# Patient Record
Sex: Female | Born: 1950
Health system: Southern US, Community
[De-identification: ages and names within clinical notes are randomized; demographics above are authoritative.]

## PROBLEM LIST (undated history)

## (undated) DIAGNOSIS — D219 Benign neoplasm of connective and other soft tissue, unspecified: Secondary | ICD-10-CM

## (undated) DIAGNOSIS — M8580 Other specified disorders of bone density and structure, unspecified site: Secondary | ICD-10-CM

## (undated) DIAGNOSIS — G20 Parkinson's disease: Secondary | ICD-10-CM

## (undated) DIAGNOSIS — K219 Gastro-esophageal reflux disease without esophagitis: Secondary | ICD-10-CM

## (undated) DIAGNOSIS — M6281 Muscle weakness (generalized): Secondary | ICD-10-CM

## (undated) HISTORY — PX: FINGER SURGERY: SHX640

## (undated) HISTORY — PX: MOUTH SURGERY: SHX715

## (undated) HISTORY — DX: Benign neoplasm of connective and other soft tissue, unspecified: D21.9

## (undated) HISTORY — PX: TUBAL LIGATION: SHX77

## (undated) HISTORY — PX: TONSILLECTOMY: SUR1361

## (undated) HISTORY — DX: Parkinson's disease: G20

## (undated) HISTORY — PX: TOE SURGERY: SHX1073

## (undated) HISTORY — DX: Muscle weakness (generalized): M62.81

## (undated) HISTORY — DX: Other specified disorders of bone density and structure, unspecified site: M85.80

## (undated) HISTORY — DX: Gastro-esophageal reflux disease without esophagitis: K21.9

---

## 2000-04-02 ENCOUNTER — Other Ambulatory Visit: Admission: RE | Admit: 2000-04-02 | Discharge: 2000-04-02 | Payer: Self-pay | Admitting: Obstetrics and Gynecology

## 2001-07-22 ENCOUNTER — Encounter: Payer: Self-pay | Admitting: *Deleted

## 2001-07-22 ENCOUNTER — Encounter: Admission: RE | Admit: 2001-07-22 | Discharge: 2001-07-22 | Payer: Self-pay | Admitting: *Deleted

## 2001-09-04 ENCOUNTER — Ambulatory Visit (HOSPITAL_BASED_OUTPATIENT_CLINIC_OR_DEPARTMENT_OTHER): Admission: RE | Admit: 2001-09-04 | Discharge: 2001-09-04 | Payer: Self-pay | Admitting: *Deleted

## 2002-03-29 ENCOUNTER — Encounter: Payer: Self-pay | Admitting: *Deleted

## 2002-03-29 ENCOUNTER — Encounter: Admission: RE | Admit: 2002-03-29 | Discharge: 2002-03-29 | Payer: Self-pay | Admitting: *Deleted

## 2002-04-08 ENCOUNTER — Other Ambulatory Visit: Admission: RE | Admit: 2002-04-08 | Discharge: 2002-04-08 | Payer: Self-pay | Admitting: *Deleted

## 2002-11-12 ENCOUNTER — Emergency Department (HOSPITAL_COMMUNITY): Admission: EM | Admit: 2002-11-12 | Discharge: 2002-11-12 | Payer: Self-pay | Admitting: Emergency Medicine

## 2003-01-03 ENCOUNTER — Encounter: Payer: Self-pay | Admitting: Emergency Medicine

## 2003-01-03 ENCOUNTER — Emergency Department (HOSPITAL_COMMUNITY): Admission: EM | Admit: 2003-01-03 | Discharge: 2003-01-04 | Payer: Self-pay | Admitting: Emergency Medicine

## 2003-01-04 ENCOUNTER — Encounter: Payer: Self-pay | Admitting: Emergency Medicine

## 2003-01-04 ENCOUNTER — Ambulatory Visit (HOSPITAL_COMMUNITY): Admission: RE | Admit: 2003-01-04 | Discharge: 2003-01-04 | Payer: Self-pay | Admitting: Emergency Medicine

## 2003-02-10 ENCOUNTER — Encounter: Payer: Self-pay | Admitting: Gynecology

## 2003-02-10 ENCOUNTER — Encounter: Admission: RE | Admit: 2003-02-10 | Discharge: 2003-02-10 | Payer: Self-pay | Admitting: Gynecology

## 2003-02-16 ENCOUNTER — Other Ambulatory Visit: Admission: RE | Admit: 2003-02-16 | Discharge: 2003-02-16 | Payer: Self-pay | Admitting: Gynecology

## 2005-01-14 ENCOUNTER — Emergency Department (HOSPITAL_COMMUNITY): Admission: EM | Admit: 2005-01-14 | Discharge: 2005-01-15 | Payer: Self-pay | Admitting: *Deleted

## 2005-01-20 ENCOUNTER — Encounter: Admission: RE | Admit: 2005-01-20 | Discharge: 2005-01-20 | Payer: Self-pay | Admitting: Obstetrics and Gynecology

## 2005-06-25 ENCOUNTER — Ambulatory Visit: Payer: Self-pay | Admitting: Internal Medicine

## 2005-06-28 ENCOUNTER — Ambulatory Visit (HOSPITAL_COMMUNITY): Admission: RE | Admit: 2005-06-28 | Discharge: 2005-06-28 | Payer: Self-pay | Admitting: Internal Medicine

## 2005-07-19 ENCOUNTER — Ambulatory Visit: Payer: Self-pay | Admitting: Internal Medicine

## 2005-08-12 ENCOUNTER — Ambulatory Visit: Payer: Self-pay | Admitting: Internal Medicine

## 2005-08-27 ENCOUNTER — Ambulatory Visit: Payer: Self-pay

## 2005-09-27 ENCOUNTER — Ambulatory Visit: Payer: Self-pay

## 2005-11-27 ENCOUNTER — Ambulatory Visit: Payer: Self-pay | Admitting: Internal Medicine

## 2006-01-09 ENCOUNTER — Ambulatory Visit: Payer: Self-pay | Admitting: Internal Medicine

## 2006-10-01 ENCOUNTER — Encounter: Admission: RE | Admit: 2006-10-01 | Discharge: 2006-10-01 | Payer: Self-pay | Admitting: Obstetrics and Gynecology

## 2007-11-26 ENCOUNTER — Ambulatory Visit (HOSPITAL_COMMUNITY): Admission: RE | Admit: 2007-11-26 | Discharge: 2007-11-26 | Payer: Self-pay | Admitting: Gastroenterology

## 2007-12-29 ENCOUNTER — Encounter: Admission: RE | Admit: 2007-12-29 | Discharge: 2007-12-29 | Payer: Self-pay | Admitting: Gastroenterology

## 2011-03-15 NOTE — Op Note (Signed)
[place]. [facility]  Patient:    [name] [name], [name] [name] Visit Number: [id number] MRN: [id number]          Service Type: DSU Location: [facility] Attending Physician:  Kendell Bane Dictated by:   Lowell Bouton, M.D. Proc. Date: 09/04/01 Admit Date:  09/04/2001 Discharge Date: 09/04/2001   CC:         Lester Kinsman, M.D.   Operative Report  PREOPERATIVE DIAGNOSIS:  Mass, right index finger.  POSTOPERATIVE DIAGNOSIS:  Ganglion, right index finger.  OPERATION PERFORMED:  Excision of ganglion, right index finger.  SURGEON:  Lowell Bouton, M.D.  ANESTHESIA:  0.5% Marcaine local with sedation.  OPERATIVE FINDINGS:  The patient had a large cyst that was located on the ulnar volar aspect of the proximal phalanx of the right index finger.  It was attached to the tendon sheath.  DESCRIPTION OF PROCEDURE:  Under 0.5% Marcaine local anesthesia with a tourniquet on the right arm, the right hand was prepped and draped in the usual fashion and after exsanguinating the limb the tourniquet was inflated to 250 mmHg.  A V-shaped incision was made over the volar aspect of the proximal phalanx of the right index finger.  Blunt dissection was carried through the subcutaneous tissues and bleeding points were coagulated.  Blunt dissection was carried down to the ulnar digital nerve which was retracted ulnarly.  The mass was easily identified in the soft tissue.  The mass was excised sharply from the flexor sheath.  The wound was then irrigated with saline, the skin was closed with 4-0 nylon suture.  Sterile dressings were applied.  The patient tolerated the procedure well and went to the recovery room awake and stable in good condition. Dictated by:   Lowell Bouton, M.D. Attending Physician:  Kendell Bane DD:  09/04/01 TD:  09/07/01 Job: [id number]

## 2012-10-02 ENCOUNTER — Other Ambulatory Visit (HOSPITAL_COMMUNITY)
Admission: RE | Admit: 2012-10-02 | Discharge: 2012-10-02 | Disposition: A | Discharge status: Home / Self Care | Payer: 59 | Source: Ambulatory Visit | Attending: Women's Health | Admitting: Women's Health

## 2012-10-02 ENCOUNTER — Ambulatory Visit (INDEPENDENT_AMBULATORY_CARE_PROVIDER_SITE_OTHER): Payer: 59 | Admitting: Women's Health

## 2012-10-02 ENCOUNTER — Encounter: Payer: Self-pay | Admitting: Women's Health

## 2012-10-02 VITALS — BP 128/86 | Ht 67.0 in | Wt 167.0 lb

## 2012-10-02 DIAGNOSIS — Z01419 Encounter for gynecological examination (general) (routine) without abnormal findings: Secondary | ICD-10-CM

## 2012-10-02 DIAGNOSIS — Z1151 Encounter for screening for human papillomavirus (HPV): Secondary | ICD-10-CM | POA: Insufficient documentation

## 2012-10-02 DIAGNOSIS — Z780 Asymptomatic menopausal state: Secondary | ICD-10-CM

## 2012-10-02 DIAGNOSIS — D126 Benign neoplasm of colon, unspecified: Secondary | ICD-10-CM

## 2012-10-02 DIAGNOSIS — K635 Polyp of colon: Secondary | ICD-10-CM | POA: Insufficient documentation

## 2012-10-02 NOTE — Progress Notes (Signed)
MERLIN EGE January 03, 1951 [id number]    History:    New patient presents for annual exam. Reports good health, history of normal Paps and mammograms. Mother with history of breast cancer, living age 62. Postmenopausal greater than 10 years with no bleeding/no HRT, occasional hot flush. Reports bone density normal 7-8 years ago. Benign polyp with colonoscopy 2010. Has not received zostavac.  Past medical history, past surgical history, family history and social history were all reviewed and documented in the EPIC chart. Physiological scientist. Widowed, 1 son died of cardiac defect, same cause of death as husband. Had a normal cardiac workup. History of shingles left arm.   ROS:  A  ROS was performed and pertinent positives and negatives are included in the history.  Exam:  Filed Vitals:   10/02/12 1116  BP: 128/86    General appearance:  Normal Head/Neck:  Normal, without cervical or supraclavicular adenopathy. Thyroid:  Symmetrical, normal in size, without palpable masses or nodularity. Respiratory  Effort:  Normal  Auscultation:  Clear without wheezing or rhonchi Cardiovascular  Auscultation:  Regular rate, without rubs, murmurs or gallops  Edema/varicosities:  Not grossly evident Abdominal  Soft,nontender, without masses, guarding or rebound.  Liver/spleen:  No organomegaly noted  Hernia:  None appreciated  Skin  Inspection:  Grossly normal  Palpation:  Grossly normal Neurologic/psychiatric  Orientation:  Normal with appropriate conversation.  Mood/affect:  Normal  Genitourinary    Breasts: Examined lying and sitting.     Right: Without masses, retractions, discharge or axillary adenopathy.     Left: Without masses, retractions, discharge or axillary adenopathy.   Inguinal/mons:  Normal without inguinal adenopathy  External genitalia:  Normal  BUS/Urethra/Skene's glands:  Normal  Bladder:  Normal  Vagina:  Plus 1 cystocele   Cervix:  Normal  Uterus:  Anteverted, 10  weeks' size lobular,   Midline and mobile  Adnexa/parametria:     Rt: Without masses or tenderness.   Lt: Without masses or tenderness.  Anus and perineum: Normal  Digital rectal exam: Normal sphincter tone without palpated masses or tenderness  Assessment/Plan:  61 y.o. WBF G5P4 for annual exam without complaint.  Asymptomatic +1 cystocele Fibroid uterus  Plan: DEXA will schedule here. Has appointment with primary care next week for labs. SBE's, continue annual mammogram, calcium rich diet, vitamin D 2000 daily encouraged. Reports history of fibroids after exam, states had heavy cycles before menopause, currently asymptomatic. UA, Pap. Reviewed new screening guidelines. Home Hemoccult card given with instructions. Instructed to get zostavac vaccine at primary care.    Harrington Challenger [facility], 12:56 PM 10/02/2012

## 2012-10-02 NOTE — Patient Instructions (Addendum)
Vit D 2000 daily Fish oil daily Shingles vaccine/zostavac Health Recommendations for Postmenopausal Women Based on the Results of the Women's Health Initiative [facility]) and Other Studies The WHI is a major 15-year research program to address the most common causes of death, disability and poor quality of life in postmenopausal women. Some of these causes are heart disease, cancer, bone loss (osteoporosis) and others. Taking into account all of the findings from [facility] and other studies, here are bottom-line health recommendations for women: CARDIOVASCULAR DISEASE Heart Disease: A heart attack is a medical emergency. Know the signs and symptoms of a heart attack. Hormone therapy should not be used to prevent heart disease. In women with heart disease, hormone therapy should not be used to prevent further disease. Hormone therapy increases the risk of blood clots. Below are things women can do to reduce their risk for heart disease.   Do not smoke. If you smoke, quit. Women who smoke are 2 to 6 times more likely to suffer a heart attack than non-smoking women.  Aim for a healthy weight. Being overweight causes many preventable deaths. Eat a healthy and balanced diet and drink an adequate amount of liquids.  Get moving. Make a commitment to be more physically active. Aim for 30 minutes of activity on most, if not all days of the week.  Eat for heart health. Choose a diet that is low in saturated fat, trans fat, and cholesterol. Include whole grains, vegetables, and fruits. Read the labels on the food container before buying it.  Know your numbers. Ask your caregiver to check your blood pressure, cholesterol (total, HDL, LDL, triglycerides) and blood glucose. Work with your caregiver to improve any numbers that are not normal.  High blood pressure. Limit or stop your table salt intake (try salt substitute and food seasonings), avoid salty foods and drinks. Read the labels on the food container before buying  it. Avoid becoming overweight by eating well and exercising. STROKE  Stroke is a medical emergency. Stroke can be the result of a blood clot in the blood vessel in the brain or by a brain hemorrhage (bleeding). Know the signs and symptoms of a stroke. To lower the risk of developing a stroke:  Avoid fatty foods.  Quit smoking.  Control your diabetes, blood pressure, and irregular heart rate. THROMBOPHLIBITIS (BLOOD CLOT) OF THE LEG  Hormone treatment is a big cause of developing blood clots in the leg. Becoming overweight and leading a stationary lifestyle also may contribute to developing blood clots. Controlling your diet and exercising will help lower the risk of developing blood clots. CANCER SCREENING  Breast Cancer: Women should take steps to reduce their risk of breast cancer. This includes having regular mammograms, monthly self breast exams and regular breast exams by your caregiver. Have a mammogram every one to two years if you are 61 to 62 years old. Have a mammogram annually if you are 28 years old or older depending on your risk factors. Women who are high risk for breast cancer may need more frequent mammograms. There are tests available (testing the genes in your body) if you have family history of breast cancer called BRCA 1 and 2. These tests can help determine the risks of developing breast cancer.  Intestinal or Stomach Cancer: Women should talk to their caregiver about when to start screening, what tests and how often they should be done, and the benefits and risks of doing these tests. Tests to consider are a rectal exam, fecal occult  blood, sigmoidoscopy, colononoscoby, barium enema and upper GI series of the stomach. Depending on the age, you may want to get a medical and family history of colon cancer. Women who are high risk may need to be screened at an earlier age and more often.  Cervical Cancer: A Pap test of the cervix should be done every year and every 3 years when  there has been three straight years of a normal Pap test. Women with an abnormal Pap test should be screened more often or have a cervical biopsy depending on your caregiver's recommendation.  Uterine Cancer: If you have vaginal bleeding after you are in the menopause, it should be evaluated by your caregiver.  Ovarian cancer: There are no reliable tests available to screen for ovarian cancer at this time except for yearly pelvic exams.  Lung Cancer: Yearly chest X-rays can detect lung cancer and should be done on high risk women, such as cigarette smokers and women with chronic lung disease (emphysemia).  Skin Cancer: A complete body skin exam should be done at your yearly examination. Avoid overexposure to the sun and ultraviolet light lamps. Use a strong sun block cream when in the sun. All of these things are important in lowering the risk of skin cancer. MENOPAUSE Menopause Symptoms: Hormone therapy products are effective for treating symptoms associated with menopause:  Moderate to severe hot flashes.  Night sweats.  Mood swings.  Headaches.  Tiredness.  Loss of sex drive.  Insomnia.  Other symptoms. However, hormone therapy products carry serious risks, especially in older women. Women who use or are thinking about using estrogen or estrogen with progestin treatments should discuss that with their caregiver. Your caregiver will know if the benefits outweigh the risks. The Food and Drug Administration (FDA) has concluded that hormone therapy should be used only at the lowest doses and for the shortest amount of time to reach treatment goals. It is not known at what doses there may be less risk of serious side effects. There are other treatments such as herbal medication (not controlled or regulated by the FDA), group therapy, counseling and acupuncture that may be helpful. OSTEOPOROSIS Protecting Against Bone Loss and Preventing Fracture: If hormone therapy is used for prevention  of bone loss (osteoporosis), the risks for bone loss must outweigh the risk of the therapy. Women considering taking hormone therapy for bone loss should ask their health care providers about other medications (fosamax and boniva) that are considered safe and effective for preventing bone loss and bone fractures. To guard against bone loss or fractures, it is recommended that women should take at least 1000-1500 mg of calcium and 400-800 IU of vitamin D daily in divided doses. Smoking and excessive alcohol intake increases the risk of osteoporosis. Eat foods rich in calcium and vitamin D and do weight bearing exercises several times a week as your caregiver suggests. DIABETES Diabetes Melitus: Women with Type I or Type 2 diabetes should keep their diabetes in control with diet, exercise and medication. Avoid too many sweets, starchy and fatty foods. Being overweight can affect your diabetes. COGNITION AND MEMORY Cognition and Memory: Menopausal hormone therapy is not recommended for the prevention of cognitive disorders such as Alzheimer's disease or memory loss. WHI found that women treated with hormone therapy have a greater risk of developing dementia.  DEPRESSION  Depression may occur at any age, but is common in elderly women. The reasons may be because of physical, medical, social (loneliness), financial and/or economic problems and  needs. Becoming involved with church, volunteer or social groups, seeking treatment for any physical or medical problems is recommended. Also, look into getting professional advice for any economic or financial problems. ACCIDENTS  Accidents are common and can be serious in the elderly woman. Prepare your house to prevent accidents. Eliminate throw rugs, use hip protectors, place hand bars in the bath, shower and toilet areas. Avoid wearing high heel shoes and walking on wet, snowy and icy areas. Stop driving if you have vision, hearing problems or are unsteady with you  movements and reflexes. RHEUMATOID ARTHRITIS Rheumatoid arthritis causes pain, swelling and stiffness of your bone joints. It can limit many of your activities. Over-the-counter medications may help, but prescription medications may be necessary. Talk with your caregiver about this. Exercise (walking, water aerobics), good posture, using splints on painful joints, warm baths or applying warm compresses to stiff joints and cold compresses to painful joints may be helpful. Smoking and excessive drinking may worsen the symptoms of arthritis. Seek help from a physical therapist if the arthritis is becoming a problem with your daily activities. IMMUNIZATIONS  Several immunizations are important to have during your senior years, including:   Tetanus and a diptheria shot booster every 10 years.  Influenza every year before the flu season begins.  Pneumonia vaccine.  Shingles vaccine.  Others as indicated (example: H1N1 vaccine). Document Released: 12/06/2005 Document Revised: 01/06/2012 Document Reviewed: 08/01/2008 [facility] Patient Information 2013 [place], [place].

## 2012-10-03 LAB — URINALYSIS W MICROSCOPIC + REFLEX CULTURE
Bilirubin Urine: NEGATIVE
Casts: NONE SEEN
Crystals: NONE SEEN
Glucose, UA: NEGATIVE mg/dL
Hgb urine dipstick: NEGATIVE
Ketones, ur: NEGATIVE mg/dL
Nitrite: NEGATIVE
Protein, ur: NEGATIVE mg/dL
Specific Gravity, Urine: 1.005 (ref 1.005–1.030)
Urobilinogen, UA: 0.2 mg/dL (ref 0.0–1.0)
pH: 7.5 (ref 5.0–8.0)

## 2012-10-07 ENCOUNTER — Other Ambulatory Visit: Payer: Self-pay | Admitting: Women's Health

## 2012-10-07 DIAGNOSIS — D219 Benign neoplasm of connective and other soft tissue, unspecified: Secondary | ICD-10-CM

## 2012-10-07 LAB — URINE CULTURE: Colony Count: 40000

## 2012-11-13 ENCOUNTER — Ambulatory Visit (INDEPENDENT_AMBULATORY_CARE_PROVIDER_SITE_OTHER): Payer: 59

## 2012-11-13 ENCOUNTER — Ambulatory Visit (INDEPENDENT_AMBULATORY_CARE_PROVIDER_SITE_OTHER): Payer: 59 | Admitting: Women's Health

## 2012-11-13 ENCOUNTER — Encounter: Payer: Self-pay | Admitting: Women's Health

## 2012-11-13 DIAGNOSIS — D259 Leiomyoma of uterus, unspecified: Secondary | ICD-10-CM | POA: Insufficient documentation

## 2012-11-13 DIAGNOSIS — D219 Benign neoplasm of connective and other soft tissue, unspecified: Secondary | ICD-10-CM

## 2012-11-13 NOTE — Progress Notes (Signed)
Patient ID: [name] [name], female   DOB: 08/24/1951, 62 y.o.   MRN: [id number] Presents for ultrasound. History of a fibroid, enlarged uterus at annual exam. Denies bleeding, pain, or discharge.  Ultrasound: Large fundal calcified fibroid 56 x 56 x 66 mm. Ovaries appear normal no free fluid seen. Endometrium 4.5 mm.  Postmenopausal asymptomatic fibroid uterus  Plan: Annual exam to assess uterus/ovaries, return to office as needed if problems.

## 2012-11-16 ENCOUNTER — Other Ambulatory Visit: Payer: Self-pay | Admitting: Gynecology

## 2012-11-16 DIAGNOSIS — Z780 Asymptomatic menopausal state: Secondary | ICD-10-CM

## 2012-11-19 ENCOUNTER — Ambulatory Visit (INDEPENDENT_AMBULATORY_CARE_PROVIDER_SITE_OTHER): Payer: 59

## 2012-11-19 DIAGNOSIS — M8580 Other specified disorders of bone density and structure, unspecified site: Secondary | ICD-10-CM

## 2012-11-19 DIAGNOSIS — M949 Disorder of cartilage, unspecified: Secondary | ICD-10-CM

## 2012-11-19 DIAGNOSIS — Z780 Asymptomatic menopausal state: Secondary | ICD-10-CM

## 2012-11-19 DIAGNOSIS — M899 Disorder of bone, unspecified: Secondary | ICD-10-CM

## 2013-02-18 ENCOUNTER — Ambulatory Visit (INDEPENDENT_AMBULATORY_CARE_PROVIDER_SITE_OTHER): Payer: 59 | Admitting: Women's Health

## 2013-02-18 ENCOUNTER — Encounter: Payer: Self-pay | Admitting: Women's Health

## 2013-02-18 DIAGNOSIS — Z113 Encounter for screening for infections with a predominantly sexual mode of transmission: Secondary | ICD-10-CM

## 2013-02-18 DIAGNOSIS — N760 Acute vaginitis: Secondary | ICD-10-CM

## 2013-02-18 DIAGNOSIS — N898 Other specified noninflammatory disorders of vagina: Secondary | ICD-10-CM

## 2013-02-18 DIAGNOSIS — A499 Bacterial infection, unspecified: Secondary | ICD-10-CM

## 2013-02-18 DIAGNOSIS — B9689 Other specified bacterial agents as the cause of diseases classified elsewhere: Secondary | ICD-10-CM

## 2013-02-18 LAB — WET PREP FOR TRICH, YEAST, CLUE
Trich, Wet Prep: NONE SEEN
Yeast Wet Prep HPF POC: NONE SEEN

## 2013-02-18 MED ORDER — METRONIDAZOLE 0.75 % VA GEL: VAGINAL | Status: DC

## 2013-02-18 NOTE — Progress Notes (Signed)
Patient ID: [name] [name], female   DOB: 1950/12/02, 62 y.o.   MRN: [id number] Presents with complaint of increased vaginal discharge and recent new partner/sexual activity. (Not sexually active for 17 years.) Postmenopausal on no HRT and no bleeding. Denies urinary symptoms, abdominal pain or fever.  Exam: Appears well, external genitalia +1 cystocele asymptomatic, speculum exam moderate amount of milky discharge with an odor, GC/Chlamydia culture pending, wet prep positive for amines, clues, and TNTC bacteria. Bimanual no CMT or adnexal tenderness.  Bacteria vaginosis STD screen  Plan: MetroGel vaginal cream 1 applicator at bedtime x5, alcohol precautions reviewed. GC/Chlamydia culture pending, HIV, hep B., C., RPR. Reviewed importance of condoms.

## 2013-02-18 NOTE — Patient Instructions (Signed)
Bacterial Vaginosis Bacterial vaginosis (BV) is a vaginal infection where the normal balance of bacteria in the vagina is disrupted. The normal balance is then replaced by an overgrowth of certain bacteria. There are several different kinds of bacteria that can cause BV. BV is the most common vaginal infection in women of childbearing age. CAUSES   The cause of BV is not fully understood. BV develops when there is an increase or imbalance of harmful bacteria.  Some activities or behaviors can upset the normal balance of bacteria in the vagina and put women at increased risk including:  Having a new sex partner or multiple sex partners.  Douching.  Using an intrauterine device (IUD) for contraception.  It is not clear what role sexual activity plays in the development of BV. However, women that have never had sexual intercourse are rarely infected with BV. Women do not get BV from toilet seats, bedding, swimming pools or from touching objects around them.  SYMPTOMS   Grey vaginal discharge.  A fish-like odor with discharge, especially after sexual intercourse.  Itching or burning of the vagina and vulva.  Burning or pain with urination.  Some women have no signs or symptoms at all. DIAGNOSIS  Your caregiver must examine the vagina for signs of BV. Your caregiver will perform lab tests and look at the sample of vaginal fluid through a microscope. They will look for bacteria and abnormal cells (clue cells), a pH test higher than 4.5, and a positive amine test all associated with BV.  RISKS AND COMPLICATIONS   Pelvic inflammatory disease (PID).  Infections following gynecology surgery.  Developing HIV.  Developing herpes virus. TREATMENT  Sometimes BV will clear up without treatment. However, all women with symptoms of BV should be treated to avoid complications, especially if gynecology surgery is planned. Female partners generally do not need to be treated. However, BV may spread  between female sex partners so treatment is helpful in preventing a recurrence of BV.   BV may be treated with antibiotics. The antibiotics come in either pill or vaginal cream forms. Either can be used with nonpregnant or pregnant women, but the recommended dosages differ. These antibiotics are not harmful to the baby.  BV can recur after treatment. If this happens, a second round of antibiotics will often be prescribed.  Treatment is important for pregnant women. If not treated, BV can cause a premature delivery, especially for a pregnant woman who had a premature birth in the past. All pregnant women who have symptoms of BV should be checked and treated.  For chronic reoccurrence of BV, treatment with a type of prescribed gel vaginally twice a week is helpful. HOME CARE INSTRUCTIONS   Finish all medication as directed by your caregiver.  Do not have sex until treatment is completed.  Tell your sexual partner that you have a vaginal infection. They should see their caregiver and be treated if they have problems, such as a mild rash or itching.  Practice safe sex. Use condoms. Only have 1 sex partner. PREVENTION  Basic prevention steps can help reduce the risk of upsetting the natural balance of bacteria in the vagina and developing BV:  Do not have sexual intercourse (be abstinent).  Do not douche.  Use all of the medicine prescribed for treatment of BV, even if the signs and symptoms go away.  Tell your sex partner if you have BV. That way, they can be treated, if needed, to prevent reoccurrence. SEEK MEDICAL CARE IF:     Your symptoms are not improving after 3 days of treatment.  You have increased discharge, pain, or fever. MAKE SURE YOU:   Understand these instructions.  Will watch your condition.  Will get help right away if you are not doing well or get worse. FOR MORE INFORMATION  Division of STD Prevention (DSTDP), Centers for Disease Control and Prevention:  www.cdc.gov/std American Social Health Association (ASHA): www.ashastd.org  Document Released: 10/14/2005 Document Revised: 01/06/2012 Document Reviewed: 04/06/2009 [employer] Patient Information 2013 [employer].  

## 2013-02-19 LAB — GC/CHLAMYDIA PROBE AMP
CT Probe RNA: NEGATIVE
GC Probe RNA: NEGATIVE

## 2013-02-19 LAB — HIV ANTIBODY (ROUTINE TESTING W REFLEX): HIV: NONREACTIVE

## 2013-02-19 LAB — RPR: RPR Ser Ql: REACTIVE — AB

## 2013-02-19 LAB — HEPATITIS C ANTIBODY: HCV Ab: NEGATIVE

## 2013-02-19 LAB — T.PALLIDUM AB, TOTAL: T pallidum Antibodies (TP-PA): >8 S/CO — ABNORMAL HIGH (ref <0.90)

## 2013-02-19 LAB — HEPATITIS B SURFACE ANTIGEN: Hepatitis B Surface Ag: NEGATIVE

## 2013-02-19 LAB — RPR TITER: RPR Titer: 1:2 {titer}

## 2013-10-01 ENCOUNTER — Encounter: Payer: Self-pay | Admitting: Women's Health

## 2013-10-07 ENCOUNTER — Encounter: Payer: 59 | Admitting: Women's Health

## 2013-11-09 ENCOUNTER — Encounter: Payer: Self-pay | Admitting: Women's Health

## 2013-11-19 ENCOUNTER — Emergency Department (HOSPITAL_COMMUNITY)
Admission: EM | Admit: 2013-11-19 | Discharge: 2013-11-19 | Disposition: A | Discharge status: Home / Self Care | Payer: 59 | Source: Home / Self Care | Attending: Family Medicine | Admitting: Family Medicine

## 2013-11-19 ENCOUNTER — Encounter (HOSPITAL_COMMUNITY): Payer: Self-pay | Admitting: Emergency Medicine

## 2013-11-19 DIAGNOSIS — L280 Lichen simplex chronicus: Secondary | ICD-10-CM

## 2013-11-19 MED ORDER — CLOTRIMAZOLE-BETAMETHASONE 1-0.05 % EX CREA: 1.0000 "application " | TOPICAL_CREAM | Freq: Two times a day (BID) | CUTANEOUS | Status: DC

## 2013-11-19 NOTE — ED Provider Notes (Signed)
[name] [name] is a 63 y.o. female who presents to Urgent Care today for rash to her anterior left ankle for about 3 weeks. Pt reports she noticed the area was try and itchy, and has slowly gotten worse. Area is now darker than the surrounding skin but has had no swelling, redness, bleeding, or drainage. She has not noted any other areas of dryness or rash and has never had similar rash before. OTC hydrocortisone and anti-itch cream has been ineffective in relieving symptoms. Pt denies systemic symptoms such as fever / chills, N/V or change in bowel habits.  Pt states she does not have HTN, but BP is elevated today and pt concerned. Denies chest pain, SOB, change in vision, headache, LE swelling. Not on any medication. Reports no PCP.  Past Medical History  Diagnosis Date  . Fibroid    History  Substance Use Topics  . Smoking status: Never Smoker   . Smokeless tobacco: Not on file  . Alcohol Use: No   ROS as above Medications: No current facility-administered medications for this encounter.   Current Outpatient Prescriptions  Medication Sig Dispense Refill  . calcium-vitamin D (OSCAL WITH D) 250-125 MG-UNIT per tablet Take 1 tablet by mouth daily.      . metroNIDAZOLE (METROGEL VAGINAL) 0.75 % vaginal gel 1 applicator per vagina at HS x 5  70 g  0  . Multiple Vitamin (MULTIVITAMIN) capsule Take 1 capsule by mouth daily.        Exam:  BP 145/88  Pulse 70  Temp(Src) 97.9 F (36.6 C) (Oral)  Resp 16  SpO2 97% Manual BP recheck: 148/86 Gen: Well-appearing adult female NAD HEENT: EOMI,  MMM Exts: 5 cm oval patch of dry, hyperpigmented skin to anterior left ankle  Area not indurated or painful to touch, no bleeding or drainage  Area with scale and pruritic with palpation  Smaller, ~0.5 cm circular excoriation more inferior on ankle, eschar present  Otherwise with brisk capillary refill, warm and well perfused.   No results found for this or any previous visit (from the past 24  hour(s)). No results found.  Assessment and Plan: 63 y.o. female with lichenified rash to left ankle. No obvious bacterial superinfection. May have component of fungal dermatitis. Incidentally noted to have elevated BP, but asymptomatic. No PCP per patient. Rx for Lotrisone cream. Provided with number for dermatology as needed for rash if doesn't improve. Provided list of resources with instructions to f/u with a PCP for general care.  Discussed warning signs or symptoms. Please see discharge instructions. Patient expresses understanding.  The above was discussed in its entirety with attending Urgent Care physician Dr. Juventino Slovak.   Emmaline Kluver, MD  PGY-2, [facility] 11/19/2013, 2:27 PM   Emmaline Kluver, MD 11/19/13 [id number]

## 2013-11-19 NOTE — ED Notes (Signed)
Patient concerned for rash w itching on her left foot/ankle x 3 weeks, not improving w OTC hydrocortisone ; NAD

## 2013-11-19 NOTE — Discharge Instructions (Signed)
Your rash may have a fungal infection in it, but it may be just dry skin by itself. Use Lotrisone cream for your rash. If it's not getting better in about 1 week, call the dermatology office or a regular doctor's office, or come back here. Read the list below to help you find a regular doctor.   Emergency Department Resource Guide 1) Find a Doctor and Pay Out of Pocket Although you won't have to find out who is covered by your insurance plan, it is a good idea to ask around and get recommendations. You will then need to call the office and see if the doctor you have chosen will accept you as a new patient and what types of options they offer for patients who are self-pay. Some doctors offer discounts or will set up payment plans for their patients who do not have insurance, but you will need to ask so you aren't surprised when you get to your appointment.  2) Contact Your Local Health Department Not all health departments have doctors that can see patients for sick visits, but many do, so it is worth a call to see if yours does. If you don't know where your local health department is, you can check in your phone book. The CDC also has a tool to help you locate your state's health department, and many state websites also have listings of all of their local health departments.  3) Find a [facility] If your illness is not likely to be very severe or complicated, you may want to try a walk in clinic. These are popping up all over the country in pharmacies, drugstores, and shopping centers. They're usually staffed by nurse practitioners or physician assistants that have been trained to treat common illnesses and complaints. They're usually fairly quick and inexpensive. However, if you have serious medical issues or chronic medical problems, these are probably not your best option.  No Primary Care Doctor: - Call [facility] Connect at  [phone] - they can help you locate a primary care doctor that   accepts your insurance, provides certain services, etc. - Physician Referral [phone]  Agencies that provide inexpensive medical care: Organization         Address  Phone   Notes  [facility]    [phone]     [place]  [phone]   [facility] [place], [place] [address] [phone]   [address] , [place] [phone]   [facility]    [phone]   [facility]    [phone]   [place] [address], [place] Phone:  [phone], Fax:  [phone] Hours of Operation:  9 am - 6 pm, M-F.  Also accepts Medicaid/Medicare and self-pay.  [facility] [address], [address], [place] Phone: [phone], Fax: [phone]. Hours of Operation:  8:30 am - 5:30 pm, M-F.  Also accepts Medicaid and self-pay.  [facility] [place] [address], [place] Phone: [phone]   [place], [place], [place] [phone], Ext. [phone] Mondays & Thursdays: 7-9 AM.  First 15 patients are seen on a first come, first serve basis.

## 2013-11-21 NOTE — ED Provider Notes (Signed)
Medical screening examination/treatment/procedure(s) were performed by resident physician or non-physician practitioner and as supervising physician I was immediately available for consultation/collaboration.   Pauline Good MD.   Billy Fischer, MD 11/21/13 [phone]

## 2013-12-24 ENCOUNTER — Emergency Department (HOSPITAL_COMMUNITY): Payer: 59

## 2013-12-24 ENCOUNTER — Encounter (HOSPITAL_COMMUNITY): Payer: Self-pay | Admitting: Emergency Medicine

## 2013-12-24 ENCOUNTER — Emergency Department (INDEPENDENT_AMBULATORY_CARE_PROVIDER_SITE_OTHER)
Admission: EM | Admit: 2013-12-24 | Discharge: 2013-12-24 | Disposition: A | Discharge status: Other Acute Inpatient Hospital | Payer: 59 | Source: Home / Self Care

## 2013-12-24 ENCOUNTER — Emergency Department (HOSPITAL_COMMUNITY)
Admission: EM | Admit: 2013-12-24 | Discharge: 2013-12-24 | Disposition: A | Discharge status: Home / Self Care | Payer: 59 | Attending: Emergency Medicine | Admitting: Emergency Medicine

## 2013-12-24 DIAGNOSIS — R0789 Other chest pain: Secondary | ICD-10-CM

## 2013-12-24 DIAGNOSIS — Z79899 Other long term (current) drug therapy: Secondary | ICD-10-CM | POA: Insufficient documentation

## 2013-12-24 DIAGNOSIS — Z8742 Personal history of other diseases of the female genital tract: Secondary | ICD-10-CM | POA: Insufficient documentation

## 2013-12-24 DIAGNOSIS — R079 Chest pain, unspecified: Secondary | ICD-10-CM

## 2013-12-24 DIAGNOSIS — IMO0002 Reserved for concepts with insufficient information to code with codable children: Secondary | ICD-10-CM | POA: Insufficient documentation

## 2013-12-24 LAB — COMPREHENSIVE METABOLIC PANEL
ALT: 14 U/L (ref 0–35)
AST: 17 U/L (ref 0–37)
Albumin: 4 g/dL (ref 3.5–5.2)
Alkaline Phosphatase: 58 U/L (ref 39–117)
BUN: 10 mg/dL (ref 6–23)
CO2: 26 mEq/L (ref 19–32)
Calcium: 9.7 mg/dL (ref 8.4–10.5)
Chloride: 101 mEq/L (ref 96–112)
Creatinine, Ser: 0.64 mg/dL (ref 0.50–1.10)
GFR calc Af Amer: >90 mL/min (ref >90)
GFR calc non Af Amer: >90 mL/min (ref >90)
Glucose, Bld: 78 mg/dL (ref 70–99)
Potassium: 3.8 mEq/L (ref 3.7–5.3)
Sodium: 140 mEq/L (ref 137–147)
Total Bilirubin: 0.4 mg/dL (ref 0.3–1.2)
Total Protein: 7.7 g/dL (ref 6.0–8.3)

## 2013-12-24 LAB — CBC
HCT: 40.3 % (ref 36.0–46.0)
Hemoglobin: 13.9 g/dL (ref 12.0–15.0)
MCH: 29.7 pg (ref 26.0–34.0)
MCHC: 34.5 g/dL (ref 30.0–36.0)
MCV: 86.1 fL (ref 78.0–100.0)
Platelets: 165 10*3/uL (ref 150–400)
RBC: 4.68 MIL/uL (ref 3.87–5.11)
RDW: 13 % (ref 11.5–15.5)
WBC: 6.4 10*3/uL (ref 4.0–10.5)

## 2013-12-24 LAB — I-STAT TROPONIN, ED: Troponin i, poc: 0.01 ng/mL (ref 0.00–0.08)

## 2013-12-24 MED ORDER — PANTOPRAZOLE SODIUM 20 MG PO TBEC
20.0000 mg | DELAYED_RELEASE_TABLET | Freq: Every day | ORAL | Status: DC
Start: 2013-12-24 — End: 2014-10-29

## 2013-12-24 NOTE — ED Notes (Addendum)
PT c/o intermittent R sided chest pain for several weeks.  She feels as if it is "coming through the tissue of my R breast".  States the pain increases when she gets stressed out with her disabled son.  Pt sent from UC.

## 2013-12-24 NOTE — ED Notes (Signed)
C/o mid sternal area chest pain x 2 weeks, stabbing in nature, comes and goes , does not changes w inspiration, ROM upper extremities, or palpation of chest wall. No pain at present, no nausea,vomiting or sweats w the pain

## 2013-12-24 NOTE — ED Provider Notes (Signed)
CSN: [id number]     Arrival date & time 12/24/13  1436 History   First MD Initiated Contact with Patient 12/24/13 1615     Chief Complaint  Patient presents with  . Chest Pain   (Consider location/radiation/quality/duration/timing/severity/associated sxs/prior Treatment) HPI Comments: 63 year old female is complaining of chest pain for 2 weeks. She describes it as a sharp pain it lasts for just a few seconds. She has it several times a day. It is localized in the right lateral chest. There is no radiation. One week ago she had a squeezing sensation in the right chest while performing housework. She denies nausea, vomiting or diaphoresis. The pain is not worse with taking a deep breath , movement or cough.   Past Medical History  Diagnosis Date  . Fibroid    Past Surgical History  Procedure Laterality Date  . Mouth surgery    . Finger surgery    . Toe surgery     Family History  Problem Relation Age of Onset  . Rheum arthritis Mother   . Breast cancer Mother     19's  . Cancer Father     throat  . Breast cancer Sister     64's   History  Substance Use Topics  . Smoking status: Never Smoker   . Smokeless tobacco: Not on file  . Alcohol Use: No   OB History   Grav Para Term Preterm Abortions TAB SAB Ect Mult Living   5 5 5       5      Review of Systems  Constitutional: Negative.   HENT: Negative.   Respiratory: Negative.   Cardiovascular: Positive for chest pain. Negative for leg swelling.  Gastrointestinal: Negative.   Musculoskeletal: Negative.   Skin: Negative for rash.  Neurological: Negative.     Allergies  Sulfa antibiotics  Home Medications   Current Outpatient Rx  Name  Route  Sig  Dispense  Refill  . calcium-vitamin D (OSCAL WITH D) 250-125 MG-UNIT per tablet   Oral   Take 1 tablet by mouth daily.         . clotrimazole-betamethasone (LOTRISONE) cream   Topical   Apply 1 application topically 2 (two) times daily. Use twice a day until rash  clears, then once a day for 1 week.   30 g   1   . Multiple Vitamin (MULTIVITAMIN) capsule   Oral   Take 1 capsule by mouth daily.          BP 132/76  Pulse 87  Temp(Src) 98.4 F (36.9 C) (Oral)  Resp 16  SpO2 100% Physical Exam  Nursing note and vitals reviewed. Constitutional: She is oriented to person, place, and time. She appears well-developed and well-nourished. No distress.  Eyes: Conjunctivae and EOM are normal.  Neck: Normal range of motion. Neck supple.  Cardiovascular: Normal rate, regular rhythm and normal heart sounds.   Pulmonary/Chest: Effort normal and breath sounds normal. No respiratory distress. She has no wheezes.  There is minor right parasternal border chest wall tenderness but this does not reproduce the same pain for which she presents. Attempts to reproduce the pain with arm movements against resistance, taking a deep breath and coughing does not reproduce pain.  Musculoskeletal: She exhibits no edema.  Lymphadenopathy:    She has no cervical adenopathy.  Neurological: She is alert and oriented to person, place, and time.  Skin: Skin is warm and dry.  Psychiatric: She has a normal mood and affect.  ED Course  Procedures (including critical care time) Labs Review Labs Reviewed - No data to display Imaging Review No results found. EKG: NSR. No ectopy. S-T elevation V2, probably early repol.   MDM   1. Chest pain, atypical    63 year old female is being transported to the emergency department via shuttle for evaluation of chest pain. Patient stable , no pain, warm and dry, no shortness of breath, normal vital signs. The patient's PCP is sent her to the urgent care for chest pain evaluation. Since the diagnosis of cardiac, pulmonary ,and embolic etiology cannot be dismissed nor can be reproduced with musculoskeletal changes or GI etiology the patient will be transferred to the ED.   Janne Napoleon, NP 12/24/13 [phone]

## 2013-12-24 NOTE — ED Provider Notes (Signed)
Medical screening examination/treatment/procedure(s) were performed by non-physician practitioner and as supervising physician I was immediately available for consultation/collaboration.  Philipp Deputy, M.D.   Harden Mo, MD 12/24/13 2258

## 2013-12-24 NOTE — ED Provider Notes (Signed)
Medical screening examination/treatment/procedure(s) were performed by non-physician practitioner and as supervising physician I was immediately available for consultation/collaboration.  Richarda Blade, MD 12/24/13 2113

## 2013-12-24 NOTE — ED Provider Notes (Signed)
CSN: [id number]     Arrival date & time 12/24/13  1721 History   First MD Initiated Contact with Patient 12/24/13 1749     Chief Complaint  Patient presents with  . Chest Pain     (Consider location/radiation/quality/duration/timing/severity/associated sxs/prior Treatment) HPI Comments: Patient is a 63 year old female with history of fibroids who presents today with chest pain. The chest pain has been intermittent over the past 2 weeks. It is a sharp pain that lasts for a few seconds when it comes on. She also sometimes has a pressure in her right chest. This lasts approximately 5 minutes when it comes on and resolves spontaneous. She has no associated symptoms including shortness of breath, diaphoresis, nausea, vomiting, abdominal pain. She has been under an increased amount of stress recently because she has been caring for her disabled son since the residents that live in the [place] below him died. She does not smoke cigarettes. She has no hx or family history of heart disease. She has an appointment with her new PCP on Wednesday.   Patient is a 63 y.o. female presenting with chest pain. The history is provided by the patient. No language interpreter was used.  Chest Pain Associated symptoms: no abdominal pain, no fever, no nausea, no shortness of breath and not vomiting     Past Medical History  Diagnosis Date  . Fibroid    Past Surgical History  Procedure Laterality Date  . Mouth surgery    . Finger surgery    . Toe surgery    . Tubal ligation     Family History  Problem Relation Age of Onset  . Rheum arthritis Mother   . Breast cancer Mother     30's  . Cancer Father     throat  . Breast cancer Sister     2's   History  Substance Use Topics  . Smoking status: Never Smoker   . Smokeless tobacco: Not on file  . Alcohol Use: No   OB History   Grav Para Term Preterm Abortions TAB SAB Ect Mult Living   5 5 5       5      Review of Systems  Constitutional: Negative  for fever and chills.  Respiratory: Negative for shortness of breath.   Cardiovascular: Positive for chest pain. Negative for leg swelling.  Gastrointestinal: Negative for nausea, vomiting and abdominal pain.  All other systems reviewed and are negative.      Allergies  Sulfa antibiotics  Home Medications   Current Outpatient Rx  Name  Route  Sig  Dispense  Refill  . calcium-vitamin D (OSCAL WITH D) 250-125 MG-UNIT per tablet   Oral   Take 1 tablet by mouth daily.         . clotrimazole-betamethasone (LOTRISONE) cream   Topical   Apply 1 application topically 2 (two) times daily. Use twice a day until rash clears, then once a day for 1 week.   30 g   1   . Multiple Vitamin (MULTIVITAMIN) capsule   Oral   Take 1 capsule by mouth daily.          BP 153/85  Pulse 87  Temp(Src) 98.2 F (36.8 C) (Oral)  Resp 16  Ht 5\' 7"  (1.702 m)  Wt 152 lb (68.947 kg)  BMI 23.80 kg/m2  SpO2 100% Physical Exam  Nursing note and vitals reviewed. Constitutional: She is oriented to person, place, and time. She appears well-developed and well-nourished. No  distress.  HENT:  Head: Normocephalic and atraumatic.  Right Ear: External ear normal.  Left Ear: External ear normal.  Nose: Nose normal.  Mouth/Throat: Oropharynx is clear and moist.  Eyes: Conjunctivae are normal.  Neck: Normal range of motion.  Cardiovascular: Normal rate, regular rhythm, normal heart sounds, intact distal pulses and normal pulses.   Pulses:      Radial pulses are 2+ on the right side, and 2+ on the left side.  Pulmonary/Chest: Effort normal and breath sounds normal. No stridor. No respiratory distress. She has no wheezes. She has no rales.  No chest wall tenderness on my exam.   Abdominal: Soft. She exhibits no distension. There is no tenderness.  Musculoskeletal: Normal range of motion.  Neurological: She is alert and oriented to person, place, and time. She has normal strength.  Skin: Skin is warm and  dry. She is not diaphoretic. No erythema.  Psychiatric: She has a normal mood and affect. Her behavior is normal.    ED Course  Procedures (including critical care time) Labs Review Labs Reviewed  [place], ED   Imaging Review Dg Chest 2 View  12/24/2013   CLINICAL DATA:  Anterior chest pain for 2 weeks.  No injury.  EXAM: CHEST  2 VIEW  COMPARISON:  None.  FINDINGS: Cardiac silhouette is normal in size. Normal mediastinal and hilar contours.  Lungs are hyperexpanded. Mild reticular scarring noted in the lung bases. Decreased lung markings in the apices are noted suggesting emphysema. Lungs are otherwise clear.  Bony thorax is intact.  IMPRESSION: No acute cardiopulmonary disease.  Findings supports COPD.   Electronically Signed   By: Lajean Manes M.D.   On: 12/24/2013 18:13     EKG Interpretation  Date/Time:  Friday December 24 2013 17:26:10 EST Ventricular Rate:  78 PR Interval:  136 QRS Duration: 80 QT Interval:  346 QTC Calculation: 394 R Axis:   17 Text Interpretation:  Normal sinus rhythm ST \\T \ T wave abnormality, consider inferior ischemia Abnormal ECG Since last tracing of earlier today QT is normal Confirmed by Eulis Foster  MD, ELLIOTT [phone]) on 12/24/2013 7:48:16 PM        MDM   Final diagnoses:  Chest pain    Patient is to be discharged with recommendation to follow up with PCP in regards to today's hospital visit. Chest pain is not likely of cardiac or pulmonary etiology d/t presentation, well's negative, VSS, no tracheal deviation, no JVD or new murmur, RRR, breath sounds equal bilaterally, EKG without acute abnormalities, negative troponin, and negative CXR. Pt has been advised start a PPI and return to the ED is CP becomes exertional, associated with diaphoresis or nausea, radiates to left jaw/arm, worsens or becomes concerning in any way. Pt appears reliable for follow up and is agreeable to discharge.   Case has been  discussed with Dr. Eulis Foster who agrees with the above plan to discharge.      Elwyn Lade, PA-C 12/24/13 2043  Elwyn Lade, PA-C 12/24/13 2044

## 2013-12-24 NOTE — Discharge Instructions (Signed)
Chest Pain (Nonspecific) °Chest pain has many causes. Your pain could be caused by something serious, such as a heart attack or a blood clot in the lungs. It could also be caused by something less serious, such as a chest bruise or a virus. Follow up with your doctor. More lab tests or other studies may be needed to find the cause of your pain. Most of the time, nonspecific chest pain will improve within 2 to 3 days of rest and mild pain medicine. °HOME CARE °· For chest bruises, you may put ice on the sore area for 15-20 minutes, 03-04 times a day. Do this only if it makes you feel better. °· Put ice in a plastic bag. °· Place a towel between the skin and the bag. °· Rest for the next 2 to 3 days. °· Go back to work if the pain improves. °· See your doctor if the pain lasts longer than 1 to 2 weeks. °· Only take medicine as told by your doctor. °· Quit smoking if you smoke. °GET HELP RIGHT AWAY IF:  °· There is more pain or pain that spreads to the arm, neck, jaw, back, or belly (abdomen). °· You have shortness of breath. °· You cough more than usual or cough up blood. °· You have very bad back or belly pain, feel sick to your stomach (nauseous), or throw up (vomit). °· You have very bad weakness. °· You pass out (faint). °· You have a fever. °Any of these problems may be serious and may be an emergency. Do not wait to see if the problems will go away. Get medical help right away. Call your local emergency services 911 in [place] Do not drive yourself to the hospital. °MAKE SURE YOU:  °· Understand these instructions. °· Will watch this condition. °· Will get help right away if you or your child is not doing well or gets worse. °Document Released: 04/01/2008 Document Revised: 01/06/2012 Document Reviewed: 04/01/2008 °[employer]® Patient Information ©2014 [employer]. ° °

## 2014-08-29 ENCOUNTER — Encounter (HOSPITAL_COMMUNITY): Payer: Self-pay | Admitting: Emergency Medicine

## 2014-10-24 ENCOUNTER — Other Ambulatory Visit: Payer: Self-pay | Admitting: Family Medicine

## 2014-10-24 DIAGNOSIS — R29898 Other symptoms and signs involving the musculoskeletal system: Secondary | ICD-10-CM

## 2014-10-29 ENCOUNTER — Encounter (HOSPITAL_COMMUNITY): Payer: Self-pay

## 2014-10-29 ENCOUNTER — Other Ambulatory Visit: Payer: 59

## 2014-10-29 ENCOUNTER — Emergency Department (HOSPITAL_COMMUNITY)
Admission: EM | Admit: 2014-10-29 | Discharge: 2014-10-29 | Disposition: A | Discharge status: Home / Self Care | Payer: 59 | Source: Home / Self Care | Attending: Emergency Medicine | Admitting: Emergency Medicine

## 2014-10-29 DIAGNOSIS — K219 Gastro-esophageal reflux disease without esophagitis: Secondary | ICD-10-CM

## 2014-10-29 DIAGNOSIS — K5900 Constipation, unspecified: Secondary | ICD-10-CM

## 2014-10-29 DIAGNOSIS — M2560 Stiffness of unspecified joint, not elsewhere classified: Secondary | ICD-10-CM

## 2014-10-29 LAB — SEDIMENTATION RATE: Sed Rate: 3 mm/hr (ref 0–22)

## 2014-10-29 LAB — POCT I-STAT, CHEM 8
BUN: 8 mg/dL (ref 6–23)
Calcium, Ion: 1.2 mmol/L (ref 1.13–1.30)
Chloride: 105 mEq/L (ref 96–112)
Creatinine, Ser: 0.6 mg/dL (ref 0.50–1.10)
Glucose, Bld: 81 mg/dL (ref 70–99)
HCT: 45 % (ref 36.0–46.0)
Hemoglobin: 15.3 g/dL — ABNORMAL HIGH (ref 12.0–15.0)
Potassium: 3.5 mmol/L (ref 3.5–5.1)
Sodium: 139 mmol/L (ref 135–145)
TCO2: 20 mmol/L (ref 0–100)

## 2014-10-29 LAB — POCT H PYLORI SCREEN: H. PYLORI SCREEN, POC: NEGATIVE

## 2014-10-29 MED ORDER — PANTOPRAZOLE SODIUM 40 MG PO TBEC: 40.0000 mg | DELAYED_RELEASE_TABLET | Freq: Every day | ORAL | Status: DC

## 2014-10-29 MED ORDER — PREDNISONE (PAK) 10 MG PO TABS: ORAL_TABLET | ORAL | Status: DC

## 2014-10-29 NOTE — ED Notes (Signed)
C/o cannot eat , since this makes her reflux worse, unable to move bowels w/o using a laxative. States she has mainly been eating broccoli and cheese soup . She feels weak and her legs are wobbly , was reportedly told her "quads are weak because she is sedentary" . C/o she feels stiff from her neck to her feet

## 2014-10-29 NOTE — Discharge Instructions (Signed)
Start taking protonix once daily for reflux. You may need to use a laxative for the next week or so until your bowels recover.  We are going to try a prednisone taper to help with the weakness and stiffness. Take prednisone 5 tablets for 3 days, then 4 tablets for 3 days, then 3 tablets for 3 days, then 2 tablets for 3 days, then 1 tablet for 3 days. We will send the results of your blood work to your doctor.  Follow-up with your PCP in the next few weeks.

## 2014-10-29 NOTE — ED Provider Notes (Signed)
CSN: [id number]     Arrival date & time 10/29/14  1520 History   First MD Initiated Contact with Patient 10/29/14 1555     Chief Complaint  Patient presents with  . Gastrophageal Reflux   (Consider location/radiation/quality/duration/timing/severity/associated sxs/prior Treatment) HPI She is a 64-year-old woman here with her daughter for several concerns.  She reports worsening of her reflux over the last week. She's been taking ranitidine at home. She states the doctor told her she had H pylori, but does not remember being treated for this. The only thing her stomach will tolerate is broccoli cheese soup.  Associated with this is constipation. She took a laxative yesterday, and had a bowel movement today.  She also describes neck stiffness. She denies any pain. She also reports feeling weak and shaky in her thighs. There is no muscle tenderness. Her upper extremities are fine. No pain or stiffness in her hands. She has some eczema, but no photosensitive rashes. No fevers or chills.  Past Medical History  Diagnosis Date  . Fibroid    Past Surgical History  Procedure Laterality Date  . Mouth surgery    . Finger surgery    . Toe surgery    . Tubal ligation     Family History  Problem Relation Age of Onset  . Rheum arthritis Mother   . Breast cancer Mother     60's  . Cancer Father     throat  . Breast cancer Sister     40's   History  Substance Use Topics  . Smoking status: Never Smoker   . Smokeless tobacco: Not on file  . Alcohol Use: No   OB History    Gravida Para Term Preterm AB TAB SAB Ectopic Multiple Living   5 5 5       5     Review of Systems  Constitutional: Positive for appetite change. Negative for fever and chills.  HENT: Negative.   Respiratory: Negative.   Cardiovascular: Negative.   Gastrointestinal: Positive for nausea and constipation. Negative for vomiting and abdominal pain.  Musculoskeletal: Positive for neck stiffness. Negative for myalgias and  arthralgias.  Neurological: Positive for weakness (in thighs).    Allergies  Sulfa antibiotics  Home Medications   Prior to Admission medications   Medication Sig Start Date End Date Taking? Authorizing Provider  ranitidine (ZANTAC) 150 MG tablet Take 150 mg by mouth 2 (two) times daily.   Yes Historical Provider, MD  Ascorbic Acid (VITAMIN C PO) Take 1 tablet by mouth daily.    Historical Provider, MD  calcium-vitamin D (OSCAL WITH D) 250-125 MG-UNIT per tablet Take 1 tablet by mouth daily.    Historical Provider, MD  clotrimazole-betamethasone (LOTRISONE) cream Apply 1 application topically 2 (two) times daily. Use twice a day until rash clears, then once a day for 1 week. 11/19/13   Christopher M Street, MD  glucosamine-chondroitin 500-400 MG tablet Take 1 tablet by mouth daily.    Historical Provider, MD  Magnesium Hydroxide (MAGNESIA PO) Take 1 tablet by mouth daily.    Historical Provider, MD  Multiple Vitamin (MULTIVITAMIN) capsule Take 1 capsule by mouth daily.    Historical Provider, MD  pantoprazole (PROTONIX) 40 MG tablet Take 1 tablet (40 mg total) by mouth daily. 10/29/14    J , MD  predniSONE (STERAPRED UNI-PAK) 10 MG tablet Take 5 pills daily for 3 days, then 4 for 3 days, then 3 for 3 days,then 2 for 3 days, then 1 for   3 days 10/29/14    J , MD   BP 119/81 mmHg  Pulse 98  Temp(Src) 98.1 F (36.7 C) (Oral)  Resp 16  SpO2 98% Physical Exam  Constitutional: She is oriented to person, place, and time. She appears well-developed and well-nourished. No distress.  Moves slowly and stiffly  HENT:  Head: Normocephalic and atraumatic.  Neck: Neck supple.  ROM slightly limited, primarily in lateral tilt.  Cardiovascular: Normal rate, regular rhythm and normal heart sounds.   No murmur heard. Pulmonary/Chest: Effort normal and breath sounds normal. No respiratory distress. She has no wheezes. She has no rales.  Abdominal: Soft. Bowel sounds are normal. She  exhibits no distension. There is no tenderness. There is no rebound and no guarding.  Musculoskeletal: She exhibits no edema or tenderness.  Lymphadenopathy:    She has no cervical adenopathy.  Neurological: She is alert and oriented to person, place, and time.  5/5 strength in deltoids, hip flexors, and quads    ED Course  Procedures (including critical care time) Labs Review Labs Reviewed  POCT I-STAT, CHEM 8 - Abnormal; Notable for the following:    Hemoglobin 15.3 (*)    All other components within normal limits  ANA  SEDIMENTATION RATE  POCT H PYLORI SCREEN    Imaging Review No results found.   MDM   1. Gastroesophageal reflux disease, esophagitis presence not specified   2. Constipation, unspecified constipation type   3. Stiff joint    We'll start protonix 40 mg daily for reflux. H. pylori is negative. I suspect her constipation will resolve once she is eating a more varied diet. Until then, she can continue to use laxatives.  With her history of neck stiffness and subjective weakness, there is some concern for autoimmune disease. We have drawn an ANA and ESR. I will forward these results onto her primary care provider. In the meantime, we will try a prednisone taper to see if that will help.  Follow-up with PCP in the next few weeks.     J , MD 10/29/14 1710 

## 2014-10-30 ENCOUNTER — Ambulatory Visit
Admission: RE | Admit: 2014-10-30 | Discharge: 2014-10-30 | Disposition: A | Discharge status: Home / Self Care | Payer: 59 | Source: Ambulatory Visit | Attending: Family Medicine | Admitting: Family Medicine

## 2014-10-30 DIAGNOSIS — R29898 Other symptoms and signs involving the musculoskeletal system: Secondary | ICD-10-CM

## 2014-11-01 LAB — ANA: Anti Nuclear Antibody(ANA): POSITIVE — AB

## 2014-11-01 LAB — ANTI-NUCLEAR AB-TITER (ANA TITER): ANA Titer 1: NEGATIVE

## 2014-11-01 NOTE — ED Notes (Signed)
ANA pos., ANA titer negative.  Message sent to Dr. Bridgett Larsson. [name] [name] 11/01/2014

## 2014-11-03 ENCOUNTER — Telehealth (HOSPITAL_COMMUNITY): Payer: Self-pay | Admitting: *Deleted

## 2014-11-03 NOTE — ED Notes (Signed)
Dr. Bridgett Larsson wrote that is was a false pos. and to notify the pt. that her tests were normal.  She will send it to her PCP.  I called pt.  Pt. verified x 2 and given results.  Pt. given this information. Roselyn Meier 11/03/2014

## 2015-01-17 ENCOUNTER — Ambulatory Visit (INDEPENDENT_AMBULATORY_CARE_PROVIDER_SITE_OTHER): Payer: 59 | Admitting: Neurology

## 2015-01-17 ENCOUNTER — Encounter: Payer: Self-pay | Admitting: Neurology

## 2015-01-17 VITALS — BP 144/93 | HR 73 | Ht 67.0 in | Wt 152.6 lb

## 2015-01-17 DIAGNOSIS — R5381 Other malaise: Secondary | ICD-10-CM | POA: Insufficient documentation

## 2015-01-17 DIAGNOSIS — G20A1 Parkinson's disease without dyskinesia, without mention of fluctuations: Secondary | ICD-10-CM

## 2015-01-17 DIAGNOSIS — G20 Parkinson's disease: Secondary | ICD-10-CM | POA: Diagnosis not present

## 2015-01-17 DIAGNOSIS — M6281 Muscle weakness (generalized): Secondary | ICD-10-CM

## 2015-01-17 HISTORY — DX: Muscle weakness (generalized): M62.81

## 2015-01-17 HISTORY — DX: Parkinson's disease: G20

## 2015-01-17 HISTORY — DX: Parkinson's disease without dyskinesia, without mention of fluctuations: G20.A1

## 2015-01-17 MED ORDER — PRAMIPEXOLE DIHYDROCHLORIDE 0.125 MG PO TABS: ORAL_TABLET | ORAL | Status: DC

## 2015-01-17 NOTE — Patient Instructions (Signed)
Parkinson Disease Parkinson disease is a disorder of the central nervous system, which includes the brain and spinal cord. A person with this disease slowly loses the ability to completely control body movements. Within the brain, there is a group of nerve cells (basal ganglia) that help control movement. The basal ganglia are damaged and do not work properly in a person with Parkinson disease. In addition, the basal ganglia produce and use a brain chemical called dopamine. The dopamine chemical sends messages to other parts of the body to control and coordinate body movements. Dopamine levels are low in a person with Parkinson disease. If the dopamine levels are low, then the body does not receive the correct messages it needs to move normally.  CAUSES  The exact reason why the basal ganglia get damaged is not known. Some medical researchers have thought that infection, genes, environment, and certain medicines may contribute to the cause.  SYMPTOMS   An early symptom of Parkinson disease is often an uncontrolled shaking (tremor) of the hands. The tremor will often disappear when the affected hand is consciously used.  As the disease progresses, walking, talking, getting out of a chair, and new movements become more difficult.  Muscles get stiff and movements become slower.  Balance and coordination become harder.  Depression, trouble swallowing, urinary problems, constipation, and sleep problems can occur.  Later in the disease, memory and thought processes may deteriorate. DIAGNOSIS  There are no specific tests to diagnose Parkinson disease. You may be referred to a neurologist for evaluation. Your caregiver will ask about your medical history, symptoms, and perform a physical exam. Blood tests and imaging tests of your brain may be performed to rule out other diseases. The imaging tests may include an MRI or a CT scan. TREATMENT  The goal of treatment is to relieve symptoms. Medicines may be  prescribed once the symptoms become troublesome. Medicine will not stop the progression of the disease, but medicine can make movement and balance better and help control tremors. Speech and occupational therapy may also be prescribed. Sometimes, surgical treatment of the brain can be done in young people. HOME CARE INSTRUCTIONS  Get regular exercise and rest periods during the day to help prevent exhaustion and depression.  If getting dressed becomes difficult, replace buttons and zippers with Velcro and elastic on your clothing.  Take all medicine as directed by your caregiver.  Install grab bars or railings in your home to prevent falls.  Go to speech or occupational therapy as directed.  Keep all follow-up visits as directed by your caregiver. SEEK MEDICAL CARE IF:  Your symptoms are not controlled with your medicine.  You fall.  You have trouble swallowing or choke on your food. MAKE SURE YOU:  Understand these instructions.  Will watch your condition.  Will get help right away if you are not doing well or get worse. Document Released: 10/11/2000 Document Revised: 02/08/2013 Document Reviewed: 11/13/2011 Exit[employer] Patient Information 2015 [employer]. This information is not intended to replace advice given to you by your health care provider. Make sure you discuss any questions you have with your health care provider.  

## 2015-01-17 NOTE — Progress Notes (Signed)
Reason for visit: Leg weakness  Referring physician: Dr. Cleon Gustin is a 64 y.o. female  History of present illness:  [name] [name] is a 64 year old black female with a history of problems with a sensation of weakness in the legs that dates back to November 2014. The patient has had a gradually increasing problem with walking, but she denies any falls. She has difficulty getting up out of a chair and has to push off with the arms. She denies any weakness in arms. She has no numbness of the extremities. She denies any back pain, but she does have a sensation of tightness in the neck area that is uncomfortable for her. She denies difficulty controlling the bowels or the bladder. She has some features of anxiety at times. She can walk better at some moments than others. She has a sensation of internal tremor. The patient has been noted to have decreased arm swing, and lack of expression on the face. She denies any family history of Parkinson's disease. She denies problems with chewing or swallowing. She is sent to this office for an evaluation.  Past Medical History  Diagnosis Date  . Fibroid   . Osteopenia   . GERD (gastroesophageal reflux disease)   . Muscle weakness of lower extremity 01/17/2015  . Parkinson disease 01/17/2015    Past Surgical History  Procedure Laterality Date  . Mouth surgery    . Finger surgery    . Toe surgery    . Tubal ligation    . Tonsillectomy      Family History  Problem Relation Age of Onset  . Rheum arthritis Mother   . Breast cancer Mother     46's  . Cancer Father     throat  . Breast cancer Sister     72's  . Hypertension Brother   . Hyperlipidemia Brother   . Parkinsonism Neg Hx     Social history:  reports that she has never smoked. She has never used smokeless tobacco. She reports that she does not drink alcohol or use illicit drugs.  Medications:  Prior to Admission medications   Medication Sig Start Date End Date Taking?  Authorizing Provider  clotrimazole-betamethasone (LOTRISONE) cream Apply 1 application topically 2 (two) times daily. Use twice a day until rash clears, then once a day for 1 week. 11/19/13  Yes Sharon Mt Street, MD  glucosamine-chondroitin 500-400 MG tablet Take 1 tablet by mouth daily.   Yes Historical Provider, MD  Omega-3 Fatty Acids (OMEGA 3 PO) Take 1 tablet by mouth daily.   Yes Historical Provider, MD  ranitidine (ZANTAC) 150 MG tablet Take 150 mg by mouth 2 (two) times daily.   Yes Historical Provider, MD  Ascorbic Acid (VITAMIN C PO) Take 1 tablet by mouth daily.    Historical Provider, MD  calcium-vitamin D (OSCAL WITH D) 250-125 MG-UNIT per tablet Take 1 tablet by mouth daily.    Historical Provider, MD  Magnesium Hydroxide (MAGNESIA PO) Take 1 tablet by mouth daily.    Historical Provider, MD  Multiple Vitamin (MULTIVITAMIN) capsule Take 1 capsule by mouth daily.    Historical Provider, MD  pantoprazole (PROTONIX) 40 MG tablet Take 1 tablet (40 mg total) by mouth daily. Patient not taking: Reported on 01/17/2015 10/29/14   Melony Overly, MD      Allergies  Allergen Reactions  . Sulfa Antibiotics     Childhood allergy     ROS:  Out of a complete  14 system review of symptoms, the patient complains only of the following symptoms, and all other reviewed systems are negative.  Hearing loss Anxiety Weakness  Blood pressure 144/93, pulse 73, height 5\' 7"  (1.702 m), weight 152 lb 9.6 oz (69.219 kg).  Physical Exam  General: The patient is alert and cooperative at the time of the examination.  Eyes: Pupils are equal, round, and reactive to light. Discs are flat bilaterally.  Neck: The neck is supple, no carotid bruits are noted.  Respiratory: The respiratory examination is clear.  Cardiovascular: The cardiovascular examination reveals a regular rate and rhythm, no obvious murmurs or rubs are noted.  Skin: Extremities are without significant edema.  Neurologic  Exam  Mental status: The patient is alert and oriented x 3 at the time of the examination. The patient has apparent normal recent and remote memory, with an apparently normal attention span and concentration ability.  Cranial nerves: Facial symmetry is present. There is good sensation of the face to pinprick and soft touch bilaterally. The strength of the facial muscles and the muscles to head turning and shoulder shrug are normal bilaterally. Speech is well enunciated, no aphasia or dysarthria is noted. Extraocular movements are full. Visual fields are full. The tongue is midline, and the patient has symmetric elevation of the soft palate. No obvious hearing deficits are noted. The patient has masking of the face.  Motor: The motor testing reveals 5 over 5 strength of all 4 extremities. Good symmetric motor tone is noted throughout.  Sensory: Sensory testing is intact to pinprick, soft touch, vibration sensation, and position sense on all 4 extremities. No evidence of extinction is noted.  Coordination: Cerebellar testing reveals good finger-nose-finger and heel-to-shin bilaterally.  Gait and station: The patient is unable to stand from a seated position with arms crossed. Once up, the patient is able to and leg independently, she has decreased arm swing on the right relative to the left. Tandem gait is unsteady. Romberg is negative. No drift is seen.  Reflexes: Deep tendon reflexes are symmetric and normal bilaterally. Toes are downgoing bilaterally.   MRI lumbar 10/30/14:  IMPRESSION: 1. Moderate right subarticular stenosis at L4-5 and L5-S1 secondary to rightward disc protrusions at both levels. 2. Mild left subarticular narrowing at L3-4 secondary to a moderate central disc protrusion. 3. Mild left foraminal narrowing is present at L4-5. 4. Chronic endplate marrow changes are evident at L5-S1.  * MRI scan images were reviewed online. I agree with the written  report.    Assessment/Plan:  1. Parkinson's disease  The patient has a gait disorder associated with parkinsonism, the patient likely has Parkinson's disease. The patient will be set up for MRI evaluation of the brain, and she will be started on low-dose Mirapex, she will follow-up in 4 months. We will need to increase the Mirapex slowly over time.  Jill Alexanders MD 01/17/2015 7:29 PM  [facility] [address] [place], [place] [address]  Phone [phone] Fax [phone]

## 2015-01-28 ENCOUNTER — Inpatient Hospital Stay: Admission: RE | Admit: 2015-01-28 | Payer: Self-pay | Source: Ambulatory Visit

## 2015-02-16 ENCOUNTER — Emergency Department (HOSPITAL_COMMUNITY): Payer: 59

## 2015-02-16 ENCOUNTER — Encounter (HOSPITAL_COMMUNITY): Payer: Self-pay | Admitting: Cardiology

## 2015-02-16 ENCOUNTER — Emergency Department (HOSPITAL_COMMUNITY)
Admission: EM | Admit: 2015-02-16 | Discharge: 2015-02-16 | Disposition: A | Discharge status: Home / Self Care | Payer: 59 | Attending: Emergency Medicine | Admitting: Emergency Medicine

## 2015-02-16 DIAGNOSIS — Z86018 Personal history of other benign neoplasm: Secondary | ICD-10-CM | POA: Diagnosis not present

## 2015-02-16 DIAGNOSIS — F410 Panic disorder [episodic paroxysmal anxiety] without agoraphobia: Secondary | ICD-10-CM | POA: Diagnosis not present

## 2015-02-16 DIAGNOSIS — M25511 Pain in right shoulder: Secondary | ICD-10-CM | POA: Insufficient documentation

## 2015-02-16 DIAGNOSIS — Z79899 Other long term (current) drug therapy: Secondary | ICD-10-CM | POA: Insufficient documentation

## 2015-02-16 DIAGNOSIS — M8580 Other specified disorders of bone density and structure, unspecified site: Secondary | ICD-10-CM | POA: Insufficient documentation

## 2015-02-16 DIAGNOSIS — M25512 Pain in left shoulder: Secondary | ICD-10-CM | POA: Diagnosis not present

## 2015-02-16 DIAGNOSIS — M542 Cervicalgia: Secondary | ICD-10-CM | POA: Diagnosis present

## 2015-02-16 DIAGNOSIS — R51 Headache: Secondary | ICD-10-CM | POA: Diagnosis not present

## 2015-02-16 DIAGNOSIS — R269 Unspecified abnormalities of gait and mobility: Secondary | ICD-10-CM | POA: Diagnosis not present

## 2015-02-16 DIAGNOSIS — K219 Gastro-esophageal reflux disease without esophagitis: Secondary | ICD-10-CM | POA: Insufficient documentation

## 2015-02-16 DIAGNOSIS — G20 Parkinson's disease: Secondary | ICD-10-CM | POA: Diagnosis not present

## 2015-02-16 LAB — BASIC METABOLIC PANEL
Anion gap: 10 (ref 5–15)
BUN: 10 mg/dL (ref 6–23)
CO2: 26 mmol/L (ref 19–32)
Calcium: 9.9 mg/dL (ref 8.4–10.5)
Chloride: 104 mmol/L (ref 96–112)
Creatinine, Ser: 0.67 mg/dL (ref 0.50–1.10)
GFR calc Af Amer: >90 mL/min (ref >90)
GFR calc non Af Amer: >90 mL/min (ref >90)
Glucose, Bld: 113 mg/dL — ABNORMAL HIGH (ref 70–99)
Potassium: 3.2 mmol/L — ABNORMAL LOW (ref 3.5–5.1)
Sodium: 140 mmol/L (ref 135–145)

## 2015-02-16 LAB — CBC WITH DIFFERENTIAL/PLATELET
Basophils Absolute: 0 10*3/uL (ref 0.0–0.1)
Basophils Relative: 0 % (ref 0–1)
Eosinophils Absolute: 0.1 10*3/uL (ref 0.0–0.7)
Eosinophils Relative: 1 % (ref 0–5)
HCT: 40.9 % (ref 36.0–46.0)
Hemoglobin: 13.6 g/dL (ref 12.0–15.0)
Lymphocytes Relative: 25 % (ref 12–46)
Lymphs Abs: 1.9 10*3/uL (ref 0.7–4.0)
MCH: 28.5 pg (ref 26.0–34.0)
MCHC: 33.3 g/dL (ref 30.0–36.0)
MCV: 85.6 fL (ref 78.0–100.0)
Monocytes Absolute: 0.6 10*3/uL (ref 0.1–1.0)
Monocytes Relative: 8 % (ref 3–12)
Neutro Abs: 5 10*3/uL (ref 1.7–7.7)
Neutrophils Relative %: 66 % (ref 43–77)
Platelets: 164 10*3/uL (ref 150–400)
RBC: 4.78 MIL/uL (ref 3.87–5.11)
RDW: 13.1 % (ref 11.5–15.5)
WBC: 7.6 10*3/uL (ref 4.0–10.5)

## 2015-02-16 MED ORDER — LORAZEPAM 2 MG/ML IJ SOLN
1.0000 mg | Freq: Once | INTRAMUSCULAR | Status: AC
  Administered 2015-02-16: 1 mg via INTRAVENOUS
  Filled 2015-02-16: qty 1

## 2015-02-16 NOTE — Discharge Instructions (Signed)
Return to the emergency department if you have sudden change in strength, inability to talk, difficulty swallowing, or other concerning symptoms.

## 2015-02-16 NOTE — ED Notes (Signed)
Pt reports she has been seen by PMD for current complaints of pain to neck, shoulder, arm with general feeling of weakness.  Was referred to neurologist.  Per pt, dr spent only approx 3 minutes with her and scared her by telling her she prob has beginning stages of parkinsons.  No other exams ordered.  Pt is changing neuro dr.  Lubertha Basque to period of anxiety, has been referred to psychologist which is not set up as of yet.  Pt PMD then moved from area and she is in transition looking for a new female physician.  Currently tells me that her pain is okay today because she took ibuprofen which seems to help.  She wants to know what the cause of her pain is and is asking about MRI head.

## 2015-02-16 NOTE — ED Provider Notes (Signed)
Care assumed from Dr. Winfred Leeds with plan to follow up MRI and blood test results.  Labs look fine, slight hypokalemia, advised dietary changes.  MRI shows no acute abnormality.  She already has an appointment scheduled with neurology.  Appears stable for dc home.    Clinical Impression: 1. Gait disorder       [name] Grit, MD 02/16/15 2007

## 2015-02-16 NOTE — ED Notes (Signed)
Pt relaxing more after meds.

## 2015-02-16 NOTE — ED Provider Notes (Signed)
CSN: [id number]     Arrival date & time 02/16/15  1223 History   First MD Initiated Contact with Patient 02/16/15 1347     Chief Complaint  Patient presents with  . Neck Pain  . Shoulder Pain  . Panic Attack     (Consider location/radiation/quality/duration/timing/severity/associated sxs/prior Treatment) HPI Complains of weakness and bilateral quadriceps area with difficulty walking for approximate the past 4 months. She also reports a sensation of stiffness in her shoulders and her neck accompanied by occipital headaches intermittently over the past 3 months. Patient also reports intermittent "panic attacks for the past several months. She was seen by Dr. Jannifer Franklin ,, neurologist felt likely to have Parkinson's disease. Dr. Jannifer Franklin scribed patient Mirapex however she did not fill the prescription or has not been taking it. She denies other associated symptoms. Patient is currently between PCPs as her prior PCP at [facility] has left, and she is currently looking for another one with the [facility] practice. Nothing makes symptoms better or worse. No visual changes. Past Medical History  Diagnosis Date  . Fibroid   . Osteopenia   . GERD (gastroesophageal reflux disease)   . Muscle weakness of lower extremity 01/17/2015  . Parkinson disease 01/17/2015   Past Surgical History  Procedure Laterality Date  . Mouth surgery    . Finger surgery    . Toe surgery    . Tubal ligation    . Tonsillectomy     Family History  Problem Relation Age of Onset  . Rheum arthritis Mother   . Breast cancer Mother     10's  . Cancer Father     throat  . Breast cancer Sister     15's  . Hypertension Brother   . Hyperlipidemia Brother   . Parkinsonism Neg Hx    History  Substance Use Topics  . Smoking status: Never Smoker   . Smokeless tobacco: Never Used  . Alcohol Use: No   OB History    Gravida Para Term Preterm AB TAB SAB Ectopic Multiple Living   5 5 5       5      Review of Systems   Musculoskeletal: Positive for gait problem. Negative for back pain.       Neck and shoulder stiffness, feels weak and quadriceps  Neurological: Positive for headaches.  Psychiatric/Behavioral: Positive for dysphoric mood.       Anxiety  All other systems reviewed and are negative.     Allergies  Sulfa antibiotics  Home Medications   Prior to Admission medications   Medication Sig Start Date End Date Taking? Authorizing Provider  Ascorbic Acid (VITAMIN C PO) Take 1 tablet by mouth daily.    Historical Provider, MD  calcium-vitamin D (OSCAL WITH D) 250-125 MG-UNIT per tablet Take 1 tablet by mouth daily.    Historical Provider, MD  clotrimazole-betamethasone (LOTRISONE) cream Apply 1 application topically 2 (two) times daily. Use twice a day until rash clears, then once a day for 1 week. 11/19/13   Sharon Mt Street, MD  glucosamine-chondroitin 500-400 MG tablet Take 1 tablet by mouth daily.    Historical Provider, MD  Magnesium Hydroxide (MAGNESIA PO) Take 1 tablet by mouth daily.    Historical Provider, MD  Multiple Vitamin (MULTIVITAMIN) capsule Take 1 capsule by mouth daily.    Historical Provider, MD  Omega-3 Fatty Acids (OMEGA 3 PO) Take 1 tablet by mouth daily.    Historical Provider, MD  pantoprazole (PROTONIX) 40 MG tablet Take 1  tablet (40 mg total) by mouth daily. Patient not taking: Reported on 01/17/2015 10/29/14   Melony Overly, MD  pramipexole (MIRAPEX) 0.125 MG tablet 1 tablet 3 times a day for 2 weeks, then take 2 tablets 3 times a day 01/17/15   Kathrynn Ducking, MD  ranitidine (ZANTAC) 150 MG tablet Take 150 mg by mouth 2 (two) times daily.    Historical Provider, MD   BP 120/76 mmHg  Pulse 90  Temp(Src) 98.2 F (36.8 C) (Oral)  Resp 18  SpO2 100% Physical Exam  Constitutional: She is oriented to person, place, and time. She appears well-developed and well-nourished.  HENT:  Head: Normocephalic and atraumatic.  No facial asymmetry  Eyes: Conjunctivae are  normal. Pupils are equal, round, and reactive to light.  Neck: Neck supple. No tracheal deviation present. No thyromegaly present.  Cardiovascular: Normal rate and regular rhythm.   No murmur heard. Pulmonary/Chest: Effort normal and breath sounds normal.  Abdominal: Soft. Bowel sounds are normal. She exhibits no distension. There is no tenderness.  Musculoskeletal: Normal range of motion. She exhibits no edema or tenderness.  Neurological: She is alert and oriented to person, place, and time. She has normal reflexes. Coordination normal.  DTRs symmetric bilaterally at knee jerk ankle jerk and biceps toes or going bilaterally. Gait normal Romberg normal pronator drift normal finger to nose normal  Skin: Skin is warm and dry. No rash noted.  Psychiatric: She has a normal mood and affect. Thought content normal.  Nursing note and vitals reviewed.   ED Course  Procedures (including critical care time) Labs Review Labs Reviewed - No data to display  Imaging Review No results found.   EKG Interpretation None     signed out to Dr. Doy Mince at 530 pm  MDM  MRI scan of brain ordered as patient has history of difficulty with gait. It had been ordered by Dr. Tobey Grim outpatient however patient did not follow-through and did not keep point for MRI scan. She has since decided that she will be seen by another neurologist and has an appointment with Dr. Posey Pronto at [facility] neurology scheduled for 03/22/2015 Final diagnoses:  None   Dx #1 headache #2 anxiety #3 gait disorder by history     Orlie Dakin, MD 02/16/15 1731

## 2015-02-16 NOTE — ED Notes (Signed)
Pt reports neck pain and shoulder pain over the past couple of months. Also reports she has had 2 panic attacks recently. States that she is in transition between PCP at this time and has not been able to get an appt.

## 2015-03-17 ENCOUNTER — Encounter: Payer: Self-pay | Admitting: *Deleted

## 2015-03-17 ENCOUNTER — Other Ambulatory Visit: Payer: Self-pay | Admitting: *Deleted

## 2015-03-20 ENCOUNTER — Ambulatory Visit (INDEPENDENT_AMBULATORY_CARE_PROVIDER_SITE_OTHER): Payer: 59 | Admitting: Neurology

## 2015-03-20 ENCOUNTER — Encounter: Payer: Self-pay | Admitting: Neurology

## 2015-03-20 ENCOUNTER — Telehealth: Payer: Self-pay | Admitting: Neurology

## 2015-03-20 ENCOUNTER — Encounter: Payer: Self-pay | Admitting: *Deleted

## 2015-03-20 VITALS — BP 118/80 | HR 120 | Ht 67.0 in | Wt 133.3 lb

## 2015-03-20 DIAGNOSIS — R269 Unspecified abnormalities of gait and mobility: Secondary | ICD-10-CM

## 2015-03-20 DIAGNOSIS — M4806 Spinal stenosis, lumbar region: Secondary | ICD-10-CM | POA: Diagnosis not present

## 2015-03-20 DIAGNOSIS — G20 Parkinson's disease: Secondary | ICD-10-CM | POA: Diagnosis not present

## 2015-03-20 DIAGNOSIS — M4807 Spinal stenosis, lumbosacral region: Secondary | ICD-10-CM

## 2015-03-20 MED ORDER — PRAMIPEXOLE DIHYDROCHLORIDE 0.125 MG PO TABS: ORAL_TABLET | ORAL | Status: DC

## 2015-03-20 NOTE — Patient Instructions (Addendum)
Start mirapex (pramipexole) as follows:    Week 1:  0.125 mg 1 tablet three times per day for a week  Week 2:  2 tablets three times per day for a week   Week 3:  3 tablets three times daily  If you are tolerating the medication, please call my office in 3 weeks so I can send a new prescription for 0.5 mg tablet which you can start to take 1 pill three times per day.  We will start you in a physical therapy program  Return to clinic in 3 months

## 2015-03-20 NOTE — Telephone Encounter (Signed)
Dejanira called and wanted to know if a restriction note could be written for her for work, please call her at [phone]

## 2015-03-20 NOTE — Telephone Encounter (Signed)
Letter signed and mailed

## 2015-03-20 NOTE — Telephone Encounter (Signed)
Patient said that the note would need to ask for no excessive walking and she will need ample breaks.  Can we put the dx as muscle weakness please?    Mail to patient.

## 2015-03-20 NOTE — Telephone Encounter (Signed)
If ok to send note, what are the restrictions?

## 2015-03-20 NOTE — Progress Notes (Signed)
Note sent

## 2015-03-20 NOTE — Telephone Encounter (Signed)
She did not mention any work restrictions.  If she can let me know what work she cannot complete, it would be helpful to write a letter.  Does she want breaks, reduced hours, etc?   If she is walking a lot or having problems related to parkinson's disease, I would be happy to complete.  It if is pain related, would be better completed by her PCP.    Siri Buege K. Posey Pronto, DO

## 2015-03-20 NOTE — Progress Notes (Signed)
[facility] Clinic Note - Initial Visit   Date: 03/20/2015   [name] [name] MRN: [id number] DOB: [birth year]   Dear Dr. Orland Penman:  Thank you for your kind referral of [name] [name] for consultation of bilateral leg weakness. Although her history is [name] known to you, please allow [place] to reiterate it for the purpose of our medical record. The patient was accompanied to the clinic by daughter who also provides collateral information.     History of Present Illness: [name] [name] is a 64 y.o. right-handed African American female with GERD presenting for evaluation of bilateral leg weakness.    In November 2015, she started noticing a sensation of leg weakness and imbalance.  She was previously able to walk around her apartment block comfortably, but now has feels weak even with short distances.  She feels that her quadriceps are weak.  Her daughter feels that she is slower walking now.  She endorses stiff sensation of her arms.  She is able to do more after rest.  She was evaluated at [facility] by Dr. Jannifer Franklin for the same symptoms in March 2016 who diagnosed her with parkinson's disease.  She also underwent MRI brain which showed mild-moderate chronic microvascular changes and and lumbar spine which showed lumbar degenerative changes and disc protrusion at L4-5 and L5-S1 as [name] as central disc protrusion at L3-4.  Since her visit with Dr. Jannifer Franklin, she has been extremely anxious about the diagnosis.  She is still working and contemplating retiring soon.  She has never been able to smell [name], endorses constipation, and denies vivid dreams or RLS symptoms.  Denies any tremors.  Hand writing has become a little smaller over the years.    She is more apprehensive about walking in open spaces, as compared to closed and more confined areas.  She has not had any falls.  Denies any numbness/tingling, back pain, or knee pain.   Out-side paper records, electronic medical record, and  images have been reviewed where available and summarized as:  Labs 10/29/2014:  ANA neg (rechecked), ESR 3 Labs 02/18/2013:  RPR positive (treated in the past)  MRI brain wo contrast 02/16/2015: IMPRESSION: 1. No acute intracranial abnormality. 2. Mild to moderate for age nonspecific white matter signal changes, most commonly due to chronic small vessel disease. 3. Benign appearing bone defects at the vertex favored to represent physiologic arachnoid granulations.  MRI lumbar spine 10/30/2014: 1. Moderate right subarticular stenosis at L4-5 and L5-S1 secondary to rightward disc protrusions at both levels. 2. Mild left subarticular narrowing at L3-4 secondary to a moderate central disc protrusion. 3. Mild left foraminal narrowing is present at L4-5. 4. Chronic endplate marrow changes are evident at L5-S1.    Past Medical History  Diagnosis Date  . Fibroid   . Osteopenia   . GERD (gastroesophageal reflux disease)   . Muscle weakness of lower extremity 01/17/2015  . Parkinson disease 01/17/2015    Past Surgical History  Procedure Laterality Date  . Mouth surgery    . Finger surgery    . Toe surgery    . Tubal ligation    . Tonsillectomy       Medications:  Current Outpatient Prescriptions on File Prior to Visit  Medication Sig Dispense Refill  . Ascorbic Acid (VITAMIN C PO) Take 1 tablet by mouth daily.    . calcium-vitamin D (OSCAL WITH D) 250-125 MG-UNIT per tablet Take 1 tablet by mouth daily.    . Cholecalciferol (VITAMIN D)  2000 UNITS CAPS Take 2,000 Units by mouth daily.    . clobetasol ointment (TEMOVATE) 0.05 %     . clotrimazole-betamethasone (LOTRISONE) cream Apply 1 application topically 2 (two) times daily. Use twice a day until rash clears, then once a day for 1 week. 30 g 1  . glucosamine-chondroitin 500-400 MG tablet Take 1 tablet by mouth daily.    Marland Kitchen ibuprofen (ADVIL,MOTRIN) 200 MG tablet Take 200 mg by mouth every 6 (six) hours as needed.    . Magnesium  Hydroxide (MAGNESIA PO) Take 1 tablet by mouth daily.    . Multiple Vitamin (MULTIVITAMIN) capsule Take 1 capsule by mouth daily.    . Omega-3 Fatty Acids (OMEGA 3 PO) Take 1 tablet by mouth daily.    . ranitidine (ZANTAC) 150 MG tablet Take 150 mg by mouth 2 (two) times daily.    Marland Kitchen tetrahydrozoline-zinc (VISINE-AC) 0.05-0.25 % ophthalmic solution Place 2 drops into both eyes 4 (four) times daily as needed (allergies).    . VOLTAREN 1 % GEL      No current facility-administered medications on file prior to visit.    Allergies:  Allergies  Allergen Reactions  . Sulfa Antibiotics     Childhood allergy     Family History: Family History  Problem Relation Age of Onset  . Rheum arthritis Mother   . Breast cancer Mother     17's  . Cancer Father     throat  . Breast cancer Sister     29's  . Hypertension Brother   . Hyperlipidemia Brother   . Parkinsonism Neg Hx     Social History: History   Social History  . Marital Status: Divorced    Spouse Name: N/A  . Number of Children: 4  . Years of Education: B.A.   Occupational History  . SECRETARY    Social History Main Topics  . Smoking status: Never Smoker   . Smokeless tobacco: Never Used  . Alcohol Use: No  . Drug Use: No  . Sexual Activity: No   Other Topics Concern  . Not on file   Social History Narrative   Patient is right handed.   Patient drinks 2-3 cups of caffeine per day.   Lives alone in a 2 story home.  Has 4 living children.  1 passed away at age 66.  Works as Web designer for [employer]      Review of Systems:  CONSTITUTIONAL: No fevers, chills, night sweats, or weight loss.   EYES: No visual changes or eye pain ENT: No hearing changes.  No history of nose bleeds.   RESPIRATORY: No cough, wheezing and shortness of breath.   CARDIOVASCULAR: Negative for chest pain, and palpitations.   GI: Negative for abdominal discomfort, blood in stools or black stools.  No recent change in  bowel habits.   GU:  No history of incontinence.   MUSCLOSKELETAL: No history of joint pain or swelling.  No myalgias.   SKIN: Negative for lesions, rash, and itching.   HEMATOLOGY/ONCOLOGY: Negative for prolonged bleeding, bruising easily, and swollen nodes.  No history of cancer.   ENDOCRINE: Negative for cold or heat intolerance, polydipsia or goiter.   PSYCH:  No depression +anxiety symptoms.   NEURO: As Above.   Vital Signs:  BP 118/80 mmHg  Pulse 120  Ht $R'5\' 7"'aO$  (1.702 m)  Wt 133 lb 5 oz (60.47 kg)  BMI 20.87 kg/m2  SpO2 99%   General Medical Exam:   General:  [name] appearing, comfortable.   Eyes/ENT: see cranial nerve examination.   Neck: No masses appreciated.  Full range of motion without tenderness.  No carotid bruits. Respiratory:  Clear to auscultation, good air entry bilaterally.   Cardiac:  Regular rate and rhythm, no murmur.   Extremities:  No deformities, edema, or skin discoloration.  Skin:  No rashes or lesions.  Neurological Exam: MENTAL STATUS including orientation to time, place, person, recent and remote memory, attention span and concentration, language, and fund of knowledge is normal.  Speech is not dysarthric.  Affect is moderately blunted.   CRANIAL NERVES: II:  No visual field defects.  Unremarkable fundi.   III-IV-VI: Pupils equal round and reactive to light.  Normal conjugate, extra-ocular eye movements in all directions of gaze, except limited upgaze bilaterally.  No nystagmus.  No ptosis.   V:  Normal facial sensation.  Jaw jerk is present.   VII:  Normal facial symmetry and movements.  Snout and Myerson's sign is present.    VIII:  Normal hearing and vestibular function.   IX-X:  Normal palatal movement.   XI:  Normal shoulder shrug and head rotation.   XII:  Normal tongue strength and range of motion, no deviation or fasciculation.  MOTOR:  No atrophy, fasciculations or abnormal movements.  No pronator drift.    Right Upper Extremity:    Left  Upper Extremity:    Deltoid  5/5   Deltoid  5/5   Biceps  5/5   Biceps  5/5   Triceps  5/5   Triceps  5/5   Wrist extensors  5/5   Wrist extensors  5/5   Wrist flexors  5/5   Wrist flexors  5/5   Finger extensors  5/5   Finger extensors  5/5   Finger flexors  5/5   Finger flexors  5/5   Dorsal interossei  5/5   Dorsal interossei  5/5   Abductor pollicis  5/5   Abductor pollicis  5/5   Tone (Ashworth scale)  0+  Tone (Ashworth scale)  0+   Right Lower Extremity:    Left Lower Extremity:    Hip flexors  5/5   Hip flexors  5/5   Hip extensors  5/5   Hip extensors  5/5   Knee flexors  5/5   Knee flexors  5/5   Knee extensors  5/5   Knee extensors  5/5   Dorsiflexors  5/5   Dorsiflexors  5/5   Plantarflexors  5/5   Plantarflexors  5/5   Toe extensors  5/5   Toe extensors  5/5   Toe flexors  5/5   Toe flexors  5/5   Tone (Ashworth scale)  0  Tone (Ashworth scale)  0   MSRs:  Right                                                                 Left brachioradialis 2+  brachioradialis 2+  biceps 2+  biceps 2+  triceps 2+  triceps 2+  patellar 2+  patellar 2+  ankle jerk 2+  ankle jerk 2+  Hoffman no  Hoffman no  plantar response down  plantar response down   SENSORY:  Normal and symmetric perception of light touch, pinprick, vibration,  and proprioception.  Romberg's sign absent.   COORDINATION/GAIT: Markedly slowed finger tapping on the left with reduced rate and amplitude.  Heel and toe tapping is also slowed on the left. With two efforts, she is able to rise from a chair without using arms.  Gait shows small, shuffling pattern with poor arm swing on the left.  She turns in three steps.  Tandem and stressed gait intact.  Pull test negative.   IMPRESSION: [name] [name] is a 64 year-old female presenting for evaluation of gait abnormality and second opinion of parkinson's disease.  I do agree that based on her history and clinical exam findings, she has parkinson's disease, of the  akinetic rigid type.  I had extensive discussion with the patient and daughter regarding the pathogenesis, etiology, management, and natural course of parkinson's disease.  At this juncture, I will start her on a dopamine agonist and involved in LSVT program.   PLAN/RECOMMENDATIONS:  1. Start mirapex (pramipexole) as follows:    Week 1:  0.125 mg 1 tablet three times per day for a week  Week 2:  2 tablets three times per day for a week   Week 3:  3 tablets three times daily  2.  Patient to call with update ine 3 weeks. If tolerating, will send new prescription for 0.5 mg tablet to take 1 pill three times per day.  3.  Start physical therapy LSVT program  4.  Return to clinic in 3 months   The duration of this appointment visit was 60 minutes of face-to-face time with the patient.  Greater than 50% of this time was spent in counseling, explanation of diagnosis, planning of further management, and coordination of care.   Thank you for allowing me to participate in patient's care.  If I can answer any additional questions, I would be pleased to do so.    Sincerely,    Aahna Rossa K. Posey Pronto, DO

## 2015-03-20 NOTE — Telephone Encounter (Signed)
Letter has been printed.  There is no weakness on exam, so I cannot make that diagnosis.    Riki Berninger K. Posey Pronto, DO

## 2015-05-12 ENCOUNTER — Ambulatory Visit: Payer: 59 | Admitting: Neurology

## 2015-06-23 ENCOUNTER — Ambulatory Visit: Payer: 59 | Admitting: Neurology

## 2015-07-07 ENCOUNTER — Ambulatory Visit: Payer: 59 | Admitting: Neurology

## 2015-07-24 ENCOUNTER — Ambulatory Visit: Payer: 59 | Admitting: Neurology

## 2015-08-30 ENCOUNTER — Ambulatory Visit: Payer: 59 | Admitting: Neurology

## 2016-03-16 ENCOUNTER — Encounter (HOSPITAL_COMMUNITY): Payer: Self-pay

## 2016-03-16 ENCOUNTER — Emergency Department (HOSPITAL_COMMUNITY): Payer: Commercial Managed Care - HMO

## 2016-03-16 ENCOUNTER — Emergency Department (HOSPITAL_COMMUNITY)
Admission: EM | Admit: 2016-03-16 | Discharge: 2016-03-16 | Disposition: A | Discharge status: Home / Self Care | Payer: Commercial Managed Care - HMO | Attending: Emergency Medicine | Admitting: Emergency Medicine

## 2016-03-16 DIAGNOSIS — D259 Leiomyoma of uterus, unspecified: Secondary | ICD-10-CM | POA: Diagnosis not present

## 2016-03-16 DIAGNOSIS — Z8719 Personal history of other diseases of the digestive system: Secondary | ICD-10-CM | POA: Insufficient documentation

## 2016-03-16 DIAGNOSIS — R109 Unspecified abdominal pain: Secondary | ICD-10-CM | POA: Diagnosis present

## 2016-03-16 DIAGNOSIS — N390 Urinary tract infection, site not specified: Secondary | ICD-10-CM | POA: Insufficient documentation

## 2016-03-16 DIAGNOSIS — M8580 Other specified disorders of bone density and structure, unspecified site: Secondary | ICD-10-CM | POA: Insufficient documentation

## 2016-03-16 DIAGNOSIS — G20 Parkinson's disease: Secondary | ICD-10-CM | POA: Diagnosis not present

## 2016-03-16 DIAGNOSIS — Z79899 Other long term (current) drug therapy: Secondary | ICD-10-CM | POA: Diagnosis not present

## 2016-03-16 LAB — URINALYSIS, ROUTINE W REFLEX MICROSCOPIC
Bilirubin Urine: NEGATIVE
Glucose, UA: NEGATIVE mg/dL
Hgb urine dipstick: NEGATIVE
Ketones, ur: NEGATIVE mg/dL
Nitrite: POSITIVE — AB
Protein, ur: NEGATIVE mg/dL
Specific Gravity, Urine: 1.02 (ref 1.005–1.030)
pH: 5.5 (ref 5.0–8.0)

## 2016-03-16 LAB — COMPREHENSIVE METABOLIC PANEL
ALT: 14 U/L (ref 14–54)
AST: 19 U/L (ref 15–41)
Albumin: 4.1 g/dL (ref 3.5–5.0)
Alkaline Phosphatase: 50 U/L (ref 38–126)
Anion gap: 9 (ref 5–15)
BUN: 11 mg/dL (ref 6–20)
CO2: 25 mmol/L (ref 22–32)
Calcium: 9.7 mg/dL (ref 8.9–10.3)
Chloride: 107 mmol/L (ref 101–111)
Creatinine, Ser: 0.74 mg/dL (ref 0.44–1.00)
GFR calc Af Amer: >60 mL/min (ref >60)
GFR calc non Af Amer: >60 mL/min (ref >60)
Glucose, Bld: 99 mg/dL (ref 65–99)
Potassium: 3.6 mmol/L (ref 3.5–5.1)
Sodium: 141 mmol/L (ref 135–145)
Total Bilirubin: 0.6 mg/dL (ref 0.3–1.2)
Total Protein: 7.5 g/dL (ref 6.5–8.1)

## 2016-03-16 LAB — CBC
HCT: 41.2 % (ref 36.0–46.0)
Hemoglobin: 13.4 g/dL (ref 12.0–15.0)
MCH: 28.3 pg (ref 26.0–34.0)
MCHC: 32.5 g/dL (ref 30.0–36.0)
MCV: 86.9 fL (ref 78.0–100.0)
Platelets: 184 10*3/uL (ref 150–400)
RBC: 4.74 MIL/uL (ref 3.87–5.11)
RDW: 12.9 % (ref 11.5–15.5)
WBC: 7.4 10*3/uL (ref 4.0–10.5)

## 2016-03-16 LAB — URINE MICROSCOPIC-ADD ON

## 2016-03-16 LAB — LIPASE, BLOOD: Lipase: 22 U/L (ref 11–51)

## 2016-03-16 MED ORDER — CEPHALEXIN 500 MG PO CAPS: 1000.0000 mg | ORAL_CAPSULE | Freq: Two times a day (BID) | ORAL | Status: DC

## 2016-03-16 MED ORDER — IOPAMIDOL (ISOVUE-300) INJECTION 61%
INTRAVENOUS | Status: AC
  Administered 2016-03-16: 100 mL
  Filled 2016-03-16: qty 100

## 2016-03-16 MED ORDER — BETAMETHASONE DIPROPIONATE 0.05 % EX CREA: TOPICAL_CREAM | Freq: Two times a day (BID) | CUTANEOUS | Status: DC

## 2016-03-16 NOTE — ED Notes (Signed)
Patient transported to CT 

## 2016-03-16 NOTE — ED Notes (Signed)
Pt is in stable condition upon d/c and ambulates from ED. 

## 2016-03-16 NOTE — Discharge Instructions (Signed)
Urinary Tract Infection Urinary tract infections (UTIs) can develop anywhere along your urinary tract. Your urinary tract is your body's drainage system for removing wastes and extra water. Your urinary tract includes two kidneys, two ureters, a bladder, and a urethra. Your kidneys are a pair of bean-shaped organs. Each kidney is about the size of your fist. They are located below your ribs, one on each side of your spine. CAUSES Infections are caused by microbes, which are microscopic organisms, including fungi, viruses, and bacteria. These organisms are so small that they can only be seen through a microscope. Bacteria are the microbes that most commonly cause UTIs. SYMPTOMS  Symptoms of UTIs may vary by age and gender of the patient and by the location of the infection. Symptoms in young women typically include a frequent and intense urge to urinate and a painful, burning feeling in the bladder or urethra during urination. Older women and men are more likely to be tired, shaky, and weak and have muscle aches and abdominal pain. A fever may mean the infection is in your kidneys. Other symptoms of a kidney infection include pain in your back or sides below the ribs, nausea, and vomiting. DIAGNOSIS To diagnose a UTI, your caregiver will ask you about your symptoms. Your caregiver will also ask you to provide a urine sample. The urine sample will be tested for bacteria and white blood cells. White blood cells are made by your body to help fight infection. TREATMENT  Typically, UTIs can be treated with medication. Because most UTIs are caused by a bacterial infection, they usually can be treated with the use of antibiotics. The choice of antibiotic and length of treatment depend on your symptoms and the type of bacteria causing your infection. HOME CARE INSTRUCTIONS  If you were prescribed antibiotics, take them exactly as your caregiver instructs you. Finish the medication even if you feel better after  you have only taken some of the medication.  Drink enough water and fluids to keep your urine clear or pale yellow.  Avoid caffeine, tea, and carbonated beverages. They tend to irritate your bladder.  Empty your bladder often. Avoid holding urine for long periods of time.  Empty your bladder before and after sexual intercourse.  After a bowel movement, women should cleanse from front to back. Use each tissue only once. SEEK MEDICAL CARE IF:   You have back pain.  You develop a fever.  Your symptoms do not begin to resolve within 3 days. SEEK IMMEDIATE MEDICAL CARE IF:   You have severe back pain or lower abdominal pain.  You develop chills.  You have nausea or vomiting.  You have continued burning or discomfort with urination. MAKE SURE YOU:   Understand these instructions.  Will watch your condition.  Will get help right away if you are not doing well or get worse.   This information is not intended to replace advice given to you by your health care provider. Make sure you discuss any questions you have with your health care provider.   Document Released: 07/24/2005 Document Revised: 07/05/2015 Document Reviewed: 11/22/2011 Elsevier Interactive Patient Education 2016 Else[employer] Inc[employer] Uterine Fibroids Uterine fibroids are tissue masses (tumors) that can develop in the womb (uterus). They are also called leiomyomas. This type of tumor is not cancerous (benign) and does not spread to other parts of the body outside of the pelvic area, which is between the hip bones. Occasionally, fibroids may develop in the fallopian tubes, in the cervix, or  on the support structures (ligaments) that surround the uterus. You can have one or many fibroids. Fibroids can vary in size, weight, and where they grow in the uterus. Some can become quite large. Most fibroids do not require medical treatment. CAUSES A fibroid can develop when a single uterine cell keeps growing (replicating). Most cells  in the human body have a control mechanism that keeps them from replicating without control. SIGNS AND SYMPTOMS Symptoms may include:   Heavy bleeding during your period.  Bleeding or spotting between periods.  Pelvic pain and pressure.  Bladder problems, such as needing to urinate more often (urinary frequency) or urgently.  Inability to reproduce offspring (infertility).  Miscarriages. DIAGNOSIS Uterine fibroids are diagnosed through a physical exam. Your health care provider may feel the lumpy tumors during a pelvic exam. Ultrasonography and an MRI may be done to determine the size, location, and number of fibroids. TREATMENT Treatment may include:  Watchful waiting. This involves getting the fibroid checked by your health care provider to see if it grows or shrinks. Follow your health care provider's recommendations for how often to have this checked.  Hormone medicines. These can be taken by mouth or given through an intrauterine device (IUD).  Surgery.  Removing the fibroids (myomectomy) or the uterus (hysterectomy).  Removing blood supply to the fibroids (uterine artery embolization). If fibroids interfere with your fertility and you want to become pregnant, your health care provider may recommend having the fibroids removed.  HOME CARE INSTRUCTIONS  Keep all follow-up visits as directed by your health care provider. This is important.  Take medicines only as directed by your health care provider.  If you were prescribed a hormone treatment, take the hormone medicines exactly as directed.  Do not take aspirin, because it can cause bleeding.  Ask your health care provider about taking iron pills and increasing the amount of dark green, leafy vegetables in your diet. These actions can help to boost your blood iron levels, which may be affected by heavy menstrual bleeding.  Pay close attention to your period and tell your health care provider about any changes, such  as:  Increased blood flow that requires you to use more pads or tampons than usual per month.  A change in the number of days that your period lasts per month.  A change in symptoms that are associated with your period, such as abdominal cramping or back pain. SEEK MEDICAL CARE IF:  You have pelvic pain, back pain, or abdominal cramps that cannot be controlled with medicines.  You have an increase in bleeding between and during periods.  You soak tampons or pads in a half hour or less.  You feel lightheaded, extra tired, or weak. SEEK IMMEDIATE MEDICAL CARE IF:  You faint.  You have a sudden increase in pelvic pain.   This information is not intended to replace advice given to you by your health care provider. Make sure you discuss any questions you have with your health care provider.   Document Released: 10/11/2000 Document Revised: 11/04/2014 Document Reviewed: 04/12/2014 Elsevier Interactive Patient Education [employer].

## 2016-03-16 NOTE — ED Notes (Signed)
Patient here with right sided lower abdominal pain with swelling x 2 months. Complains that the pain is worse the past few days. No associated symptoms with same

## 2016-03-16 NOTE — ED Provider Notes (Signed)
CSN: [id number]     Arrival date & time 03/16/16  1603 History   First MD Initiated Contact with Patient 03/16/16 1650     Chief Complaint  Patient presents with  . right sided abdominal pain      (Consider location/radiation/quality/duration/timing/severity/associated sxs/prior Treatment) HPI Right lower abdominal pain is present for 2 months. She reports however the past couple days it has increased and it has gotten worse with movement. She was somewhat concerned because she felt when she had a problem with her fallopian tube and perceived swelling. The patient has not been sexually active for almost 20 years. She reports last sexual activity was in the 90s. She denies abnormal vaginal discharge. No vaginal bleeding. No vomiting, no fever, no diarrhea.  As an aside. The patient notes she would like some cream for some chronic eczema she has on her lower leg. Past Medical History  Diagnosis Date  . Fibroid   . Osteopenia   . GERD (gastroesophageal reflux disease)   . Muscle weakness of lower extremity 01/17/2015  . Parkinson disease ([place]) 01/17/2015   Past Surgical History  Procedure Laterality Date  . Mouth surgery    . Finger surgery    . Toe surgery    . Tubal ligation    . Tonsillectomy     Family History  Problem Relation Age of Onset  . Rheum arthritis Mother   . Breast cancer Mother     65's  . Cancer Father     throat  . Breast cancer Sister     43's  . Hypertension Brother   . Hyperlipidemia Brother   . Parkinsonism Neg Hx   . Heart attack Son     Deceased, 37  . Healthy Daughter    Social History  Substance Use Topics  . Smoking status: Never Smoker   . Smokeless tobacco: Never Used  . Alcohol Use: No   OB History    Gravida Para Term Preterm AB TAB SAB Ectopic Multiple Living   5 5 5       5      Review of Systems 10 Systems reviewed and are negative for acute change except as noted in the HPI.    Allergies  Sulfa antibiotics  Home  Medications   Prior to Admission medications   Medication Sig Start Date End Date Taking? Authorizing Provider  Ascorbic Acid (VITAMIN C PO) Take 1 tablet by mouth daily.   Yes Historical Provider, MD  b complex vitamins capsule Take 1 capsule by mouth daily.   Yes Historical Provider, MD  calcium-vitamin D (OSCAL WITH D) 250-125 MG-UNIT per tablet Take 1 tablet by mouth daily.   Yes Historical Provider, MD  Cholecalciferol (VITAMIN D) 2000 UNITS CAPS Take 2,000 Units by mouth daily.   Yes Historical Provider, MD  glucosamine-chondroitin 500-400 MG tablet Take 1 tablet by mouth daily.   Yes Historical Provider, MD  ibuprofen (ADVIL,MOTRIN) 200 MG tablet Take 400 mg by mouth every 6 (six) hours as needed for moderate pain.    Yes Historical Provider, MD  Magnesium Hydroxide (MAGNESIA PO) Take 1 tablet by mouth daily.   Yes Historical Provider, MD  Multiple Vitamin (MULTIVITAMIN) capsule Take 1 capsule by mouth daily.   Yes Historical Provider, MD  Omega-3 Fatty Acids (OMEGA 3 PO) Take 1 tablet by mouth daily.   Yes Historical Provider, MD  tetrahydrozoline-zinc (VISINE-AC) 0.05-0.25 % ophthalmic solution Place 2 drops into both eyes 4 (four) times daily as needed (allergies).  Yes Historical Provider, MD  TYROSINE PO Take 1 tablet by mouth daily.   Yes Historical Provider, MD  betamethasone dipropionate (DIPROLENE) 0.05 % cream Apply topically 2 (two) times daily. 03/16/16   Charlesetta Shanks, MD  cephALEXin (KEFLEX) 500 MG capsule Take 2 capsules (1,000 mg total) by mouth 2 (two) times daily. 03/16/16   Charlesetta Shanks, MD  clotrimazole-betamethasone (LOTRISONE) cream Apply 1 application topically 2 (two) times daily. Use twice a day until rash clears, then once a day for 1 week. 11/19/13   Sharon [address], MD  pramipexole (MIRAPEX) 0.125 MG tablet Week 1:  1 tablet three times daily, Week 2: 2 tablet three times daily, Week 3:  3 tab three times daily 03/20/15   Donika K Patel, DO   BP 101/81 mmHg   Pulse 70  Temp(Src) 98 F (36.7 C) (Oral)  Resp 18  SpO2 100% Physical Exam  Constitutional: She is oriented to person, place, and time. She appears well-developed and well-nourished.  HENT:  Head: Normocephalic and atraumatic.  Eyes: EOM are normal. Pupils are equal, round, and reactive to light.  Neck: Neck supple.  Cardiovascular: Normal rate, regular rhythm, normal heart sounds and intact distal pulses.   Pulmonary/Chest: Effort normal and breath sounds normal.  Abdominal: Soft. Bowel sounds are normal. She exhibits no distension. There is tenderness.  Moderate right lower quadrant tenderness without guarding or rebound.  Musculoskeletal: Normal range of motion. She exhibits no edema.  Neurological: She is alert and oriented to person, place, and time. She has normal strength. Coordination normal. GCS eye subscore is 4. GCS verbal subscore is 5. GCS motor subscore is 6.  Skin: Skin is warm, dry and intact.  Psychiatric: She has a normal mood and affect.    ED Course  Procedures (including critical care time) Labs Review Labs Reviewed  URINALYSIS, ROUTINE W REFLEX MICROSCOPIC (NOT AT [facility]) - Abnormal; Notable for the following:    APPearance CLOUDY (*)    Nitrite POSITIVE (*)    Leukocytes, UA MODERATE (*)    All other components within normal limits  URINE MICROSCOPIC-ADD ON - Abnormal; Notable for the following:    Squamous Epithelial / LPF 0-5 (*)    Bacteria, UA MANY (*)    All other components within normal limits  URINE CULTURE  LIPASE, BLOOD  COMPREHENSIVE METABOLIC PANEL  CBC    Imaging Review Ct Abdomen Pelvis W Contrast  03/16/2016  CLINICAL DATA:  Subacute onset of right lower quadrant abdominal pain. Initial encounter. EXAM: CT ABDOMEN AND PELVIS WITH CONTRAST TECHNIQUE: Multidetector CT imaging of the abdomen and pelvis was performed using the standard protocol following bolus administration of intravenous contrast. CONTRAST:  139mL ISOVUE-300 IOPAMIDOL  (ISOVUE-300) INJECTION 61% COMPARISON:  MRI of the lumbar spine performed 10/30/2014, and CT of the abdomen from 12/29/2007 FINDINGS: Mild left basilar atelectasis is noted. Multiple peripherally enhancing hemangiomas are noted within the liver, the largest of which measures approximately 5.4 cm in size at the right hepatic lobe. There is a 2.7 cm hemangioma at the hepatic dome, and a 3.5 cm hemangioma at the left hepatic lobe. The spleen is unremarkable in appearance. The gallbladder is within normal limits. The pancreas and adrenal glands are unremarkable. A 3.5 cm cyst is noted at the interpole region of the left kidney. The kidneys are otherwise unremarkable. There is no evidence of hydronephrosis. No renal or ureteral stones are seen. No perinephric stranding is appreciated. No free fluid is identified. The small bowel is  unremarkable in appearance. The stomach is within normal limits. No acute vascular abnormalities are seen. The appendix is normal in caliber, without evidence of appendicitis. There is a small Richter hernia at the umbilicus, containing part of the wall of the transverse colon. The colon is grossly unremarkable in appearance. The bladder is is mildly distended and grossly unremarkable. A fibroid uterus is again noted, with the largest fibroid measuring up to 7.0 cm, and associated calcification. The previously noted retroperitoneal cyst superior to the uterus has increased in size since 2009, now measuring approximately 4.5 cm. No inguinal lymphadenopathy is seen. No acute osseous abnormalities are identified. Vacuum phenomenon and endplate sclerosis are seen at L5-S1. IMPRESSION: 1. No acute abnormality seen to explain the patient's symptoms. 2. Previously noted retroperitoneal cyst superior to the uterus has increased in size since 2009, now measuring approximately 4.5 cm. Given the slow rate of growth, this is most likely benign, though if the patient's symptoms persist, MRI of the pelvis  could be considered further evaluation. 3. Enlarged fibroid uterus again noted, with the largest fibroid measuring up to 7.0 cm, and associated calcification. 4. Multiple hepatic hemangiomas again noted, measuring up to 5.4 cm in size. 5. Small Richter hernia at the umbilicus, containing part of the wall of the transverse colon. 6. Left renal cyst noted. 7. Mild left basilar atelectasis noted. Electronically Signed   By: Garald Balding M.D.   On: 03/16/2016 19:54   I have personally reviewed and evaluated these images and lab results as part of my medical decision-making.   EKG Interpretation None      MDM   Final diagnoses:  UTI (lower urinary tract infection)  Uterine leiomyoma, unspecified location   The patient is alert and nontoxic. She is well in appearance today. She does describe 2 months of right lower quadrant pain that has worsened over 2 days. CT does not show development of appendicitis or other acute surgical etiology. Patient's fibroids are present and with some enlargement but neoplastic appearing per radiology. Patient is nonetheless counseled that she requires follow-up with GYN. At this time, no suspicion of STD. Patient does have UTI which will be treated. This may be the cause of her acute on chronic discomfort. Patient also requested a topical cream for a patch of eczema on her left lateral ankle. Follow up with PCP and possibly dermatology. She is given the short duration of topical steroid.    Charlesetta Shanks, MD 03/16/16 2134

## 2016-03-19 LAB — URINE CULTURE: Culture: >=100000 — AB

## 2016-03-20 ENCOUNTER — Telehealth (HOSPITAL_BASED_OUTPATIENT_CLINIC_OR_DEPARTMENT_OTHER): Payer: Self-pay | Admitting: Emergency Medicine

## 2016-03-20 NOTE — Telephone Encounter (Signed)
Post ED Visit - Positive Culture Follow-up  Culture report reviewed by antimicrobial stewardship pharmacist:  []  Elenor Quinones, Pharm.D. []  Heide Guile, Pharm.D., BCPS []  Parks Neptune, Pharm.D. []  Alycia Rossetti, Pharm.D., BCPS []  [place], Pharm.D., BCPS, AAHIVP []  Legrand Como, Pharm.D., BCPS, AAHIVP [x]  Milus Glazier, Pharm.D. []  Stephens November, [place].D.  Positive urine culture Treated with cephalexin, organism sensitive to the same and no further patient follow-up is required at this time.  Hazle Nordmann 03/20/2016, 9:33 AM

## 2016-05-07 ENCOUNTER — Telehealth: Payer: Self-pay | Admitting: Internal Medicine

## 2016-05-07 ENCOUNTER — Encounter: Payer: Self-pay | Admitting: Internal Medicine

## 2016-05-07 NOTE — Telephone Encounter (Signed)
I informed patient that she is due for her colonoscopy and patient states that she just woke up and will callback to schedule.

## 2016-07-08 DIAGNOSIS — R8299 Other abnormal findings in urine: Secondary | ICD-10-CM | POA: Diagnosis not present

## 2016-07-08 DIAGNOSIS — L309 Dermatitis, unspecified: Secondary | ICD-10-CM | POA: Diagnosis not present

## 2016-07-08 DIAGNOSIS — R259 Unspecified abnormal involuntary movements: Secondary | ICD-10-CM | POA: Diagnosis not present

## 2016-07-08 DIAGNOSIS — Z23 Encounter for immunization: Secondary | ICD-10-CM | POA: Diagnosis not present

## 2016-07-09 ENCOUNTER — Telehealth: Payer: Self-pay | Admitting: Neurology

## 2016-07-09 NOTE — Telephone Encounter (Signed)
The patient is now requesting a third opinion regarding Parkinson's disease. She has been seen by me and Dr. Posey Pronto, both of [place] indicated that she had Parkinson's disease.    I have just discussed this with Dr. Jaynee Eagles. She does not wish to see the patient, I think that this is fine as the patient has already had a second opinion regarding this diagnosis. I will be happy to see the patient back in the future.

## 2016-08-07 ENCOUNTER — Encounter: Payer: Self-pay | Admitting: Neurology

## 2016-08-07 ENCOUNTER — Ambulatory Visit (INDEPENDENT_AMBULATORY_CARE_PROVIDER_SITE_OTHER): Payer: Medicare Other | Admitting: Neurology

## 2016-08-07 VITALS — BP 104/62 | HR 96 | Ht 67.0 in | Wt 118.5 lb

## 2016-08-07 DIAGNOSIS — G20 Parkinson's disease: Secondary | ICD-10-CM | POA: Diagnosis not present

## 2016-08-07 MED ORDER — ESCITALOPRAM OXALATE 10 MG PO TABS: 10.0000 mg | ORAL_TABLET | Freq: Every day | ORAL | 3 refills | Status: DC

## 2016-08-07 MED ORDER — PRAMIPEXOLE DIHYDROCHLORIDE 0.125 MG PO TABS: ORAL_TABLET | ORAL | 1 refills | Status: DC

## 2016-08-07 NOTE — Patient Instructions (Addendum)
  Mirapex (pramipexole) may result in confusion or hallucinations, dizziness, drowsiness, or nausea. If any significant side effects are noted, please contact our office.     Parkinson Disease Parkinson disease is a disorder of the central nervous system, which includes the brain and spinal cord. A person with this disease slowly loses the ability to completely control body movements. Within the brain, there is a group of nerve cells (basal ganglia) that help control movement. The basal ganglia are damaged and do not work properly in a person with Parkinson disease. In addition, the basal ganglia produce and use a brain chemical called dopamine. The dopamine chemical sends messages to other parts of the body to control and coordinate body movements. Dopamine levels are low in a person with Parkinson disease. If the dopamine levels are low, then the body does not receive the correct messages it needs to move normally.  CAUSES  The exact reason why the basal ganglia get damaged is not known. Some medical researchers have thought that infection, genes, environment, and certain medicines may contribute to the cause.  SYMPTOMS   An early symptom of Parkinson disease is often an uncontrolled shaking (tremor) of the hands. The tremor will often disappear when the affected hand is consciously used.  As the disease progresses, walking, talking, getting out of a chair, and new movements become more difficult.  Muscles get stiff and movements become slower.  Balance and coordination become harder.  Depression, trouble swallowing, urinary problems, constipation, and sleep problems can occur.  Later in the disease, memory and thought processes may deteriorate. DIAGNOSIS  There are no specific tests to diagnose Parkinson disease. You may be referred to a neurologist for evaluation. Your caregiver will ask about your medical history, symptoms, and perform a physical exam. Blood tests and imaging tests of your  brain may be performed to rule out other diseases. The imaging tests may include an MRI or a CT scan. TREATMENT  The goal of treatment is to relieve symptoms. Medicines may be prescribed once the symptoms become troublesome. Medicine will not stop the progression of the disease, but medicine can make movement and balance better and help control tremors. Speech and occupational therapy may also be prescribed. Sometimes, surgical treatment of the brain can be done in young people. HOME CARE INSTRUCTIONS  Get regular exercise and rest periods during the day to help prevent exhaustion and depression.  If getting dressed becomes difficult, replace buttons and zippers with Velcro and elastic on your clothing.  Take all medicine as directed by your caregiver.  Install grab bars or railings in your home to prevent falls.  Go to speech or occupational therapy as directed.  Keep all follow-up visits as directed by your caregiver. SEEK MEDICAL CARE IF:  Your symptoms are not controlled with your medicine.  You fall.  You have trouble swallowing or choke on your food. MAKE SURE YOU:  Understand these instructions.  Will watch your condition.  Will get help right away if you are not doing well or get worse.   This information is not intended to replace advice given to you by your health care provider. Make sure you discuss any questions you have with your health care provider.   Document Released: 10/11/2000 Document Revised: 02/08/2013 Document Reviewed: 11/13/2011 Elsevier Interactive Patient Education [employer].

## 2016-08-07 NOTE — Progress Notes (Signed)
Reason for visit: Parkinson's disease  Referring physician: Dr. Angelina Ok is a 65 y.o. female  History of present illness:  [name] is a 65 year old right-handed black female with a history of a change in her walking that began a year and half or 2 years ago. The patient was evaluated in March 2016, she was diagnosed with Parkinson's disease and started on Mirapex. She never took the medication, she sought out a second opinion through Dr. Posey Pronto. The patient was once again placed on Mirapex, and set up for physical therapy. The patient never went on the medication and never went for physical therapy. The patient has been without any treatment over the last year and a half, she comes back to this office today for an evaluation. She is still having some mild problems with walking, she feels weak all over, she has not sustained any falls. She has had no changes in her speech or swallowing, she has had some micrographia with writing. The patient denies any tremor. She is not having significant issues with cognitive changes, but she has suffered from some anxiety issues. She comes back to this office for further evaluation.  Past Medical History:  Diagnosis Date  . Fibroid   . GERD (gastroesophageal reflux disease)   . Muscle weakness of lower extremity 01/17/2015  . Osteopenia   . Parkinson disease ([place]) 01/17/2015    Past Surgical History:  Procedure Laterality Date  . FINGER SURGERY    . MOUTH SURGERY    . TOE SURGERY    . TONSILLECTOMY    . TUBAL LIGATION      Family History  Problem Relation Age of Onset  . Rheum arthritis Mother   . Breast cancer Mother     36's  . Cancer Father     throat  . Breast cancer Sister     42's  . Hypertension Brother   . Hyperlipidemia Brother   . Heart attack Son     Deceased, 12  . Healthy Daughter   . Parkinsonism Neg Hx     Social history:  reports that she has never smoked. She has never used smokeless tobacco. She  reports that she does not drink alcohol or use drugs.  Medications:  Prior to Admission medications   Medication Sig Start Date End Date Taking? Authorizing Provider  Ascorbic Acid (VITAMIN C PO) Take 1 tablet by mouth daily.   Yes Historical Provider, MD  b complex vitamins capsule Take 1 capsule by mouth daily.   Yes Historical Provider, MD  betamethasone dipropionate (DIPROLENE) 0.05 % cream Apply topically 2 (two) times daily. 03/16/16  Yes Charlesetta Shanks, MD  calcium-vitamin D (OSCAL WITH D) 250-125 MG-UNIT per tablet Take 1 tablet by mouth daily.   Yes Historical Provider, MD  Cholecalciferol (VITAMIN D) 2000 UNITS CAPS Take 2,000 Units by mouth daily.   Yes Historical Provider, MD  glucosamine-chondroitin 500-400 MG tablet Take 1 tablet by mouth daily.   Yes Historical Provider, MD  ibuprofen (ADVIL,MOTRIN) 200 MG tablet Take 400 mg by mouth every 6 (six) hours as needed for moderate pain.    Yes Historical Provider, MD  Magnesium Hydroxide (MAGNESIA PO) Take 1 tablet by mouth daily.   Yes Historical Provider, MD  Multiple Vitamin (MULTIVITAMIN) capsule Take 1 capsule by mouth daily.   Yes Historical Provider, MD  Omega-3 Fatty Acids (OMEGA 3 PO) Take 1 tablet by mouth daily.   Yes Historical Provider, MD  tetrahydrozoline-zinc [facility])  0.05-0.25 % ophthalmic solution Place 2 drops into both eyes 4 (four) times daily as needed (allergies).   Yes Historical Provider, MD  TYROSINE PO Take 1 tablet by mouth daily.   Yes Historical Provider, MD      Allergies  Allergen Reactions  . Sulfa Antibiotics Other (See Comments)    Childhood allergy     ROS:  Out of a complete 14 system review of symptoms, the patient complains only of the following symptoms, and all other reviewed systems are negative.  Depression, anxiety  Blood pressure 104/62, pulse 96, height 5\' 7"  (1.702 m), weight 118 lb 8 oz (53.8 kg).  Physical Exam  General: The patient is alert and cooperative at the time  of the examination.  Eyes: Pupils are equal, round, and reactive to light. Discs are flat bilaterally.  Neck: The neck is supple, no carotid bruits are noted.  Respiratory: The respiratory examination is clear.  Cardiovascular: The cardiovascular examination reveals a regular rate and rhythm, no obvious murmurs or rubs are noted.  Skin: Extremities are without significant edema.  Neurologic Exam  Mental status: The patient is alert and oriented x 3 at the time of the examination. The patient has apparent normal recent and remote memory, with an apparently normal attention span and concentration ability.  Cranial nerves: Facial symmetry is present. There is good sensation of the face to pinprick and soft touch bilaterally. The strength of the facial muscles and the muscles to head turning and shoulder shrug are normal bilaterally. Speech is well enunciated, no aphasia or dysarthria is noted. Extraocular movements are full. Visual fields are full. The tongue is midline, and the patient has symmetric elevation of the soft palate. No obvious hearing deficits are noted. Masking of the face is seen.  Motor: The motor testing reveals 5 over 5 strength of all 4 extremities. Good symmetric motor tone is noted throughout.  Sensory: Sensory testing is intact to pinprick, soft touch, vibration sensation, and position sense on all 4 extremities. No evidence of extinction is noted.  Coordination: Cerebellar testing reveals good finger-nose-finger and heel-to-shin bilaterally.  Gait and station: The patient is unable to rise from a seated position with arms crossed. She can stand up with pushing off with arms, once up, she is able to ambulate without assistance. There is some decreased arm swing on the left relative to the right. No tremor seen. Tandem gait is slightly unsteady. Romberg is negative. No drift is seen.  Reflexes: Deep tendon reflexes are symmetric and normal bilaterally. Toes are downgoing  bilaterally.   MRI brain 02/16/16:  IMPRESSION: 1.  No acute intracranial abnormality. 2. Mild to moderate for age nonspecific white matter signal changes, most commonly due to chronic small vessel disease. 3. Benign appearing bone defects at the vertex favored to represent physiologic arachnoid granulations.  * MRI scan images were reviewed online. I agree with the written report.    Assessment/Plan:  1. Parkinson's disease  2. Anxiety disorder  The patient has a mild gait disorder associated with Parkinson's disease. I have indicated that she is to get into a regular exercise program, she wants to undergo some physical therapy, I will order this. The patient will be placed back on low-dose Mirapex, she will call in 6 weeks to go over her progress, we may continue to increase the Mirapex dosing. The patient will be placed on Lexapro for her anxiety. She will follow-up in 4 months.  Jill Alexanders MD 08/07/2016 3:19 PM  [facility] [address] [place], [place] [address]  Phone [phone] Fax [phone]

## 2016-09-16 ENCOUNTER — Ambulatory Visit: Payer: Medicare Other | Attending: Neurology | Admitting: Physical Therapy

## 2016-09-16 DIAGNOSIS — R293 Abnormal posture: Secondary | ICD-10-CM | POA: Diagnosis not present

## 2016-09-16 DIAGNOSIS — R29818 Other symptoms and signs involving the nervous system: Secondary | ICD-10-CM

## 2016-09-16 DIAGNOSIS — R2689 Other abnormalities of gait and mobility: Secondary | ICD-10-CM

## 2016-09-16 DIAGNOSIS — R2681 Unsteadiness on feet: Secondary | ICD-10-CM

## 2016-09-17 NOTE — Therapy (Signed)
[facility] [address] [place], [place], [address] Phone: [phone]   Fax:  [phone]  Physical Therapy Treatment  Patient Details  Name: [name] [name] MRN: [id number] Date of Birth: [birth year] Referring Provider: Dr. Jannifer Franklin  Encounter Date: 09/16/2016      PT End of Session - 09/17/16 1311    Visit Number 1   Number of Visits 17   Date for PT Re-Evaluation 11/15/16   Authorization Type Medicare Primary, [employer] 2nd-GCODE every 10th visit   PT Start Time 1404   PT Stop Time 1455   PT Time Calculation (min) 51 min   Activity Tolerance Patient tolerated treatment well   Behavior During Therapy [facility] for tasks assessed/performed      Past Medical History:  Diagnosis Date  . Fibroid   . GERD (gastroesophageal reflux disease)   . Muscle weakness of lower extremity 01/17/2015  . Osteopenia   . Parkinson disease ([place]) 01/17/2015    Past Surgical History:  Procedure Laterality Date  . FINGER SURGERY    . MOUTH SURGERY    . TOE SURGERY    . TONSILLECTOMY    . TUBAL LIGATION      There were no vitals filed for this visit.      Subjective Assessment - 09/16/16 1409    Subjective Pt is a 65 year old female who presents to OP PT with Parkinson's disease, get apprehensive when it's time to do something.  Have some stiffness, some episodes of freezing, get more fatigued easily.  This has been going on since January.  Pt has no tremors, has had no falls.  She reports difficulty with getting up from chairs.   Patient is accompained by: Family member  daughter   Patient Stated Goals Pt's goal for therapy is to reduce rigidity, relax body, have more strength and stamina.   Currently in Pain? No/denies            [facility] PT Assessment - 09/16/16 1414      Assessment   Medical Diagnosis Parkinson's disease   Referring Provider Dr. Jannifer Franklin   Onset Date/Surgical Date --  worse since January      Precautions   Precautions Fall     Balance Screen   Has the patient fallen in the past 6 months No   Has the patient had a decrease in activity level because of a fear of falling?  Yes   Is the patient reluctant to leave their home because of a fear of falling?  Yes     [place] Private residence   Living Arrangements Alone  family nearby and checks on often   Available Help at Discharge Family   Type of [place] Access Level entry  [place] area to get into home   [place] One level     Prior Function   Level of Independence Independent with basic ADLs;Independent with household mobility with device   Vocation Retired  June 2017   Leisure Enjoys going to visit friends  Has exercise bike at [employer] everyday     Posture/Postural Control   Posture/Postural Control Postural limitations   Postural Limitations Rounded Shoulders  Appears guarded     Tone   Assessment Location Right Lower Extremity;Left Lower Extremity     ROM / Strength   AROM / PROM / Strength Strength     Strength   Overall Strength Comments grossly tested at least  4/5 bilateral lower extremities     Transfers   Transfers Sit to Stand;Stand to Sit   Sit to Stand 5: Supervision;With upper extremity assist;Without upper extremity assist;With armrests;From chair/3-in-1   Five time sit to stand comments  Attempted, but unable to stand >1 time without UE support (2 attempts in 20.88 sec)   Stand to Sit 5: Supervision;With upper extremity assist;Without upper extremity assist;With armrests;To chair/3-in-1     Ambulation/Gait   Ambulation/Gait Yes   Ambulation/Gait Assistance 5: Supervision;4: Min guard   Ambulation/Gait Assistance Details Pt ambulates in very guarded UE position with no arm swing.   Ambulation Distance (Feet) 200 Feet   Assistive device None   Gait Pattern Step-through pattern   Ambulation Surface Level;Indoor   Gait velocity 18.91 sec= 1.73 ft/sec      Standardized Balance Assessment   Standardized Balance Assessment Timed Up and Go Test     Timed Up and Go Test   Normal TUG (seconds) 19.3   Manual TUG (seconds) 16.78   Cognitive TUG (seconds) 21.04   TUG Comments >13.5-15 seconds on TUG scores indicates increased fall risk.     High Level Balance   High Level Balance Comments MiniBESTest score:  11/28-See full note for details.     RLE Tone   RLE Tone Moderate     LLE Tone   LLE Tone Severe     Pt has difficulty relaxing with P/ROM into knee flexion in LLE>RLE                Self Care:  Discussed POC and results of tests/measures, including pt being at fall risk.        PT Education - 09/17/16 1310    Education provided Yes   Education Details Discussed OT referral based on pt's c/o and request; discussed ways to improve gait pattern-relaxed arm swing, comfortable step length with heel strike until formal HEP can be initiated   Person(s) Educated Patient;Child(ren)   Methods Explanation;Demonstration   Comprehension Verbalized understanding;Returned demonstration;Need further instruction          PT Short Term Goals - 09/17/16 1406      PT SHORT TERM GOAL #1   Title Pt will perform HEP with family supervision, for improved balance, transfers, posture, mobility.  TARGET 10/16/16   Time 4   Period Weeks   Status New     PT SHORT TERM GOAL #2   Title Pt will perform at least 6 of 10 reps of sit<>stand transfers with minimal to no UE support, for improved functional strength and transfer efficiency.   Time 4   Period Weeks   Status New     PT SHORT TERM GOAL #3   Title Pt will improve TUG score to less than or equal to 15 seconds for decreased fall risk.   Time 4   Period Weeks   Status New     PT SHORT TERM GOAL #4   Title Pt will ambulate at least 300 ft with least restrictive assistive device modified independently, for improved household and short distance community gait.   Time 4    Period Weeks   Status New     PT SHORT TERM GOAL #5   Title Pt will verbalize understanding of local Parkinson's disease-related resources.   Time 4   Period Weeks   Status New           PT Long Term Goals - 09/17/16 1412      PT LONG  TERM GOAL #1   Title Pt will verbalize understanding of fall prevention in the home environment.  TARGET 11/16/16   Time 8   Period Weeks   Status New     PT LONG TERM GOAL #2   Title Pt will improve TUG score to less than or equal to 13.5 seconds for decreased fall risk.   Time 8   Period Weeks   Status New     PT LONG TERM GOAL #3   Title Pt will improve TUG cognitive to less than or equal to 15 seconds for decreased fall risk/improved dual tasking with gait.   Time 8   Period Weeks   Status New     PT LONG TERM GOAL #4   Title Pt will improve MiniBESTest score to at least 16/28 for decreased fall risk.   Time 8   Period Weeks   Status New     PT LONG TERM GOAL #5   Title Pt will improve gait velocity to at least 2 ft/sec for improved gait efficiency and safety.   Time 8   Period Weeks   Status New               Plan - 09-27-2016 1312    Clinical Impression Statement Pt is a 65 year old female who presents to OP PT with Parkinson's disease, with history of GERD, fibroid tumors, muscle weakness, anxiety.  Pt presents with abnormal posture, rigidity, decreased timing and coordination with gait, decreased balance, decreased functional strength, decreased efficiency with transfers, slowed gait.   Rehab Potential Good   PT Frequency 2x / week   PT Duration 8 weeks  plus eval   PT Treatment/Interventions Functional mobility training;Gait training;Therapeutic activities;Therapeutic exercise;Neuromuscular re-education;Balance training;Patient/family education;DME Instruction;ADLs/Self Care Home Management   PT Next Visit Plan Initiate transfer training, initiate PWR! Moves in sitting/supine/standing as able, hip,ankle, step  strategies   Recommended Other Services Occupational therapy to address UE stiffness, decreased coordination, difficulty with ADLs-PT has requested order from Dr. Arlyss Gandy and Agree with Plan of Care Patient;Family member/caregiver   Family Member Consulted Daughter      Patient will benefit from skilled therapeutic intervention in order to improve the following deficits and impairments:  Abnormal gait, Decreased balance, Decreased mobility, Decreased safety awareness, Difficulty walking, Postural dysfunction, Impaired tone, Decreased endurance, Decreased strength  Visit Diagnosis: Other abnormalities of gait and mobility - Plan: PT plan of care cert/re-cert  Unsteadiness on feet - Plan: PT plan of care cert/re-cert  Abnormal posture - Plan: PT plan of care cert/re-cert  Other symptoms and signs involving the nervous system - Plan: PT plan of care cert/re-cert       G-Codes - 09-27-2016 1415    Functional Assessment Tool Used gait velocity 1.73 ft/sec, TUG 19.3 sec, TUG manual 16.78 sec, TUG cog 21.04 sec; unable to perform 5x sit<>stand; MiniBESTest score 11/28   Functional Limitation Mobility: Walking and moving around   Mobility: Walking and Moving Around Current Status [phone]) At least 40 percent but less than 60 percent impaired, limited or restricted   Mobility: Walking and Moving Around Goal Status [phone]) At least 20 percent but less than 40 percent impaired, limited or restricted      Problem List Patient Active Problem List   Diagnosis Date Noted  . Muscle weakness of lower extremity 01/17/2015  . Parkinson disease ([place]) 01/17/2015  . BV (bacterial vaginosis) 02/18/2013  . Fibroid uterus 11/13/2012  . Benign colonic polyp 10/02/2012  Frazier Butt 09/17/2016, 2:21 PM  Frazier Butt., PT  [facility] [address] [place], [place], [address] Phone: [phone]   Fax:  [phone]  Name:  [name] [name] MRN: [id number] Date of Birth: 02/08/1951

## 2016-09-25 ENCOUNTER — Ambulatory Visit: Payer: Medicare Other | Admitting: Physical Therapy

## 2016-09-25 DIAGNOSIS — R2681 Unsteadiness on feet: Secondary | ICD-10-CM

## 2016-09-25 DIAGNOSIS — R2689 Other abnormalities of gait and mobility: Secondary | ICD-10-CM | POA: Diagnosis not present

## 2016-09-25 DIAGNOSIS — R29818 Other symptoms and signs involving the nervous system: Secondary | ICD-10-CM

## 2016-09-25 DIAGNOSIS — R293 Abnormal posture: Secondary | ICD-10-CM | POA: Diagnosis not present

## 2016-09-25 NOTE — Therapy (Signed)
[facility] [address] [place], [place], [address] Phone: [phone]   Fax:  [phone]  Physical Therapy Treatment  Patient Details  Name: [name] [name] MRN: [id number] Date of Birth: 12/08/1950 Referring Provider: Dr. Jannifer Franklin  Encounter Date: 09/25/2016      PT End of Session - 09/25/16 1607    Visit Number 2   Number of Visits 17   Date for PT Re-Evaluation 11/15/16   Authorization Type Medicare Primary, [employer] 2nd-GCODE every 10th visit   PT Start Time 1528   PT Stop Time 1608   PT Time Calculation (min) 40 min   Activity Tolerance Patient tolerated treatment well   Behavior During Therapy [facility] for tasks assessed/performed      Past Medical History:  Diagnosis Date  . Fibroid   . GERD (gastroesophageal reflux disease)   . Muscle weakness of lower extremity 01/17/2015  . Osteopenia   . Parkinson disease ([place]) 01/17/2015    Past Surgical History:  Procedure Laterality Date  . FINGER SURGERY    . MOUTH SURGERY    . TOE SURGERY    . TONSILLECTOMY    . TUBAL LIGATION      There were no vitals filed for this visit.      Subjective Assessment - 09/25/16 1527    Subjective doing okay at home, but has a little anxiety outside of home.  trying to get out more though.  legs feel weak but no falls.   Patient Stated Goals Pt's goal for therapy is to reduce rigidity, relax body, have more strength and stamina.   Currently in Pain? No/denies                         [facility] Adult PT Treatment/Exercise - 09/25/16 1606      Exercises   Exercises Knee/Hip     Knee/Hip Exercises: Aerobic   Nustep SciFit L3.0 x 6 min           PWR [facility]) - 09/25/16 1532    PWR! exercises Moves in sitting;Moves in standing   PWR! Up x20   PWR! Rock x10 bil   PWR! Twist x10 bil   PWR Step x10 bil   PWR! Up x20   PWR! Rock x10 bil   PWR! Twist x10 bil   PWR! Step x10 bil           Balance Exercises - 09/25/16 1604      Balance Exercises: Standing   Stepping Strategy Anterior;Posterior;Lateral;10 reps  min tactile cues for weight shift           PT Education - 09/25/16 1607    Education provided Yes   Education Details PWR! in standing and sitting   Person(s) Educated Patient;Child(ren)   Methods Explanation;Demonstration;Handout   Comprehension Verbalized understanding;Returned demonstration;Need further instruction          PT Short Term Goals - 09/25/16 1608      PT SHORT TERM GOAL #1   Title Pt will perform HEP with family supervision, for improved balance, transfers, posture, mobility.  TARGET 10/16/16   Status On-going     PT SHORT TERM GOAL #2   Title Pt will perform at least 6 of 10 reps of sit<>stand transfers with minimal to no UE support, for improved functional strength and transfer efficiency.   Status On-going     PT SHORT TERM GOAL #3   Title Pt will improve TUG score  to less than or equal to 15 seconds for decreased fall risk.   Status On-going     PT SHORT TERM GOAL #4   Title Pt will ambulate at least 300 ft with least restrictive assistive device modified independently, for improved household and short distance community gait.   Status On-going     PT SHORT TERM GOAL #5   Title Pt will verbalize understanding of local Parkinson's disease-related resources.   Status On-going           PT Long Term Goals - 09/25/16 1609      PT LONG TERM GOAL #1   Title Pt will verbalize understanding of fall prevention in the home environment.  TARGET 11/16/16   Status On-going     PT LONG TERM GOAL #2   Title Pt will improve TUG score to less than or equal to 13.5 seconds for decreased fall risk.   Status On-going     PT LONG TERM GOAL #3   Title Pt will improve TUG cognitive to less than or equal to 15 seconds for decreased fall risk/improved dual tasking with gait.   Status On-going     PT LONG TERM GOAL #4   Title Pt will  improve MiniBESTest score to at least 16/28 for decreased fall risk.   Status On-going     PT LONG TERM GOAL #5   Title Pt will improve gait velocity to at least 2 ft/sec for improved gait efficiency and safety.   Status On-going               Plan - 09/25/16 1609    Clinical Impression Statement Issued PWR! moves in sititng and standing today with min cues for technique; daughter present to help with HEP at home.  Will continue to benefit from PT to address deficits.   PT Treatment/Interventions Functional mobility training;Gait training;Therapeutic activities;Therapeutic exercise;Neuromuscular re-education;Balance training;Patient/family education;DME Instruction;ADLs/Self Care Home Management   PT Next Visit Plan Initiate transfer training, review PWR! Moves in supine/standing as able, hip,ankle, step strategies, PWR in supine as able   Consulted and Agree with Plan of Care Patient;Family member/caregiver   Family Member Consulted Daughter      Patient will benefit from skilled therapeutic intervention in order to improve the following deficits and impairments:  Abnormal gait, Decreased balance, Decreased mobility, Decreased safety awareness, Difficulty walking, Postural dysfunction, Impaired tone, Decreased endurance, Decreased strength  Visit Diagnosis: Other abnormalities of gait and mobility  Unsteadiness on feet  Abnormal posture  Other symptoms and signs involving the nervous system     Problem List Patient Active Problem List   Diagnosis Date Noted  . Muscle weakness of lower extremity 01/17/2015  . Parkinson disease ([place]) 01/17/2015  . BV (bacterial vaginosis) 02/18/2013  . Fibroid uterus 11/13/2012  . Benign colonic polyp 10/02/2012       Laureen Abrahams, PT, DPT 09/25/16 4:11 PM   [facility] [address] [place], [place], [address] Phone: [phone]   Fax:  [phone]  Name:  [name] [name] MRN: [id number] Date of Birth: September 22, 1951

## 2016-10-08 ENCOUNTER — Ambulatory Visit: Payer: Medicare Other | Attending: Neurology | Admitting: Physical Therapy

## 2016-10-08 DIAGNOSIS — R29818 Other symptoms and signs involving the nervous system: Secondary | ICD-10-CM

## 2016-10-08 DIAGNOSIS — R2689 Other abnormalities of gait and mobility: Secondary | ICD-10-CM

## 2016-10-08 DIAGNOSIS — R293 Abnormal posture: Secondary | ICD-10-CM | POA: Insufficient documentation

## 2016-10-08 DIAGNOSIS — R2681 Unsteadiness on feet: Secondary | ICD-10-CM | POA: Insufficient documentation

## 2016-10-08 NOTE — Therapy (Signed)
[facility] [name] [place], [place], [address] Phone: [phone]   Fax:  [phone]  Physical Therapy Treatment  Patient Details  Name: [name] [name] MRN: [id number] Date of Birth: 19-Feb-1951 Referring Provider: Dr. Jannifer Franklin  Encounter Date: 10/08/2016      PT End of Session - 10/08/16 1609    Visit Number 3   Number of Visits 17   Date for PT Re-Evaluation 11/15/16   Authorization Type Medicare Primary, [employer] 2nd-GCODE every 10th visit   PT Start Time 1529   PT Stop Time 1609   PT Time Calculation (min) 40 min   Activity Tolerance Patient tolerated treatment well   Behavior During Therapy [facility] for tasks assessed/performed      Past Medical History:  Diagnosis Date  . Fibroid   . GERD (gastroesophageal reflux disease)   . Muscle weakness of lower extremity 01/17/2015  . Osteopenia   . Parkinson disease ([place]) 01/17/2015    Past Surgical History:  Procedure Laterality Date  . FINGER SURGERY    . MOUTH SURGERY    . TOE SURGERY    . TONSILLECTOMY    . TUBAL LIGATION      There were no vitals filed for this visit.      Subjective Assessment - 10/08/16 1528    Subjective doing well, no complaints or falls.  has been getting house ready for Christmas.  Doing exercises.   Patient Stated Goals Pt's goal for therapy is to reduce rigidity, relax body, have more strength and stamina.   Currently in Pain? No/denies                         [facility] Adult PT Treatment/Exercise - 10/08/16 1533      Knee/Hip Exercises: Aerobic   Nustep SciFit L3.5 x 9 min; goal to keep SPM > 40           PWR [facility]) - 10/08/16 1542    PWR! exercises Moves in sitting;Moves in standing   PWR! Up x20   PWR! Rock x10 bil   PWR! Twist x10 bil   PWR! Step x10 bil   Comments supine; with min cues for technique   PWR! Up x20   PWR! Rock x10 bil   PWR! Twist x 10 bil   PWR Step x10 bil   Comments standing; mod cues for technique   PWR! Up x20   PWR! Rock x10 bil   PWR! Twist x10 bil   PWR! Step x 10 bil   Comments sitting; mod cues for technique             PT Education - 10/08/16 1608    Education provided Yes   Education Details PWR! supine   Person(s) Educated Patient   Methods Explanation;Demonstration;Handout   Comprehension Verbalized understanding;Returned demonstration;Need further instruction          PT Short Term Goals - 09/25/16 1608      PT SHORT TERM GOAL #1   Title Pt will perform HEP with family supervision, for improved balance, transfers, posture, mobility.  TARGET 10/16/16   Status On-going     PT SHORT TERM GOAL #2   Title Pt will perform at least 6 of 10 reps of sit<>stand transfers with minimal to no UE support, for improved functional strength and transfer efficiency.   Status On-going     PT SHORT TERM GOAL #3   Title Pt will improve TUG  score to less than or equal to 15 seconds for decreased fall risk.   Status On-going     PT SHORT TERM GOAL #4   Title Pt will ambulate at least 300 ft with least restrictive assistive device modified independently, for improved household and short distance community gait.   Status On-going     PT SHORT TERM GOAL #5   Title Pt will verbalize understanding of local Parkinson's disease-related resources.   Status On-going           PT Long Term Goals - 09/25/16 1609      PT LONG TERM GOAL #1   Title Pt will verbalize understanding of fall prevention in the home environment.  TARGET 11/16/16   Status On-going     PT LONG TERM GOAL #2   Title Pt will improve TUG score to less than or equal to 13.5 seconds for decreased fall risk.   Status On-going     PT LONG TERM GOAL #3   Title Pt will improve TUG cognitive to less than or equal to 15 seconds for decreased fall risk/improved dual tasking with gait.   Status On-going     PT LONG TERM GOAL #4   Title Pt will improve MiniBESTest  score to at least 16/28 for decreased fall risk.   Status On-going     PT LONG TERM GOAL #5   Title Pt will improve gait velocity to at least 2 ft/sec for improved gait efficiency and safety.   Status On-going               Plan - 10/08/16 1609    Clinical Impression Statement Pt returns to PT after missing nearly 2 weeks due to scheduling conflicts.  Pt needed mod cues for HEP and initially stated she was doing exercises, but then admitted she was only doing standing.  Will continue to benefit from PT to maximize function.   PT Next Visit Plan Initiate transfer training, review PWR! Moves in supine, hip,ankle, step strategies; cont PWR! exercises as indicated   Consulted and Agree with Plan of Care Patient      Patient will benefit from skilled therapeutic intervention in order to improve the following deficits and impairments:  Abnormal gait, Decreased balance, Decreased mobility, Decreased safety awareness, Difficulty walking, Postural dysfunction, Impaired tone, Decreased endurance, Decreased strength  Visit Diagnosis: Other abnormalities of gait and mobility  Unsteadiness on feet  Abnormal posture  Other symptoms and signs involving the nervous system     Problem List Patient Active Problem List   Diagnosis Date Noted  . Muscle weakness of lower extremity 01/17/2015  . Parkinson disease ([place]) 01/17/2015  . BV (bacterial vaginosis) 02/18/2013  . Fibroid uterus 11/13/2012  . Benign colonic polyp 10/02/2012       Laureen Abrahams, PT, DPT 10/08/16 4:11 PM    [facility] [address] [place], [place], [address] Phone: [phone]   Fax:  [phone]  Name: [name] [name] MRN: [id number] Date of Birth: [birth year]

## 2016-10-10 ENCOUNTER — Ambulatory Visit: Payer: Medicare Other | Admitting: Physical Therapy

## 2016-10-10 DIAGNOSIS — R293 Abnormal posture: Secondary | ICD-10-CM | POA: Diagnosis not present

## 2016-10-10 DIAGNOSIS — R2689 Other abnormalities of gait and mobility: Secondary | ICD-10-CM

## 2016-10-10 DIAGNOSIS — R29818 Other symptoms and signs involving the nervous system: Secondary | ICD-10-CM | POA: Diagnosis not present

## 2016-10-10 DIAGNOSIS — R2681 Unsteadiness on feet: Secondary | ICD-10-CM

## 2016-10-10 NOTE — Therapy (Signed)
[facility] [address] [place], [place], [address] Phone: [phone]   Fax:  [phone]  Physical Therapy Treatment  Patient Details  Name: [name] [name] MRN: [id number] Date of Birth: September 11, 1951 Referring Provider: Dr. Jannifer Franklin  Encounter Date: 10/10/2016      PT End of Session - 10/10/16 1616    Visit Number 4   Number of Visits 17   Date for PT Re-Evaluation 11/15/16   Authorization Type Medicare Primary, [employer] 2nd-GCODE every 10th visit   PT Start Time 1533   PT Stop Time 1613   PT Time Calculation (min) 40 min   Activity Tolerance Patient tolerated treatment well   Behavior During Therapy [facility] for tasks assessed/performed      Past Medical History:  Diagnosis Date  . Fibroid   . GERD (gastroesophageal reflux disease)   . Muscle weakness of lower extremity 01/17/2015  . Osteopenia   . Parkinson disease ([place]) 01/17/2015    Past Surgical History:  Procedure Laterality Date  . FINGER SURGERY    . MOUTH SURGERY    . TOE SURGERY    . TONSILLECTOMY    . TUBAL LIGATION      There were no vitals filed for this visit.      Subjective Assessment - 10/10/16 1537    Subjective did supine PWR! yesterday but no other ones.  Doing well today   Patient Stated Goals Pt's goal for therapy is to reduce rigidity, relax body, have more strength and stamina.   Currently in Pain? No/denies                         [facility] Adult PT Treatment/Exercise - 10/10/16 1537      Ambulation/Gait   Ambulation/Gait Yes   Ambulation/Gait Assistance 5: Supervision   Ambulation/Gait Assistance Details cues for heel strike and arm swing, use of walking poles for arm swing feedback with improved carryover.   Ambulation Distance (Feet) 1000 Feet   Assistive device None   Gait Pattern Step-through pattern     Knee/Hip Exercises: Aerobic   Nustep SciFit L4.0 x 8 min; goal to keep SPM > 40     Knee/Hip  Exercises: Standing   Forward Step Up Both;10 reps;Hand Hold: 2;Step Height: 4"   Step Down Both;10 reps;Hand Hold: 2;Step Height: 4"     Knee/Hip Exercises: Seated   Sit to Sand 20 reps;without UE support  with power up in standing           PWR [facility]) - 10/10/16 1546    PWR! Up x20   PWR! Rock x10 bil   PWR! Twist x10 bil   PWR! Step x10 bil   Comments supine with min cues          Balance Exercises - 10/10/16 1609      Balance Exercises: Standing   Balance Beam red beam: static standing and horizontal/vertical head turns x 10 with min guard A and intermittent UE support             PT Short Term Goals - 09/25/16 1608      PT SHORT TERM GOAL #1   Title Pt will perform HEP with family supervision, for improved balance, transfers, posture, mobility.  TARGET 10/16/16   Status On-going     PT SHORT TERM GOAL #2   Title Pt will perform at least 6 of 10 reps of sit<>stand transfers with minimal to no UE  support, for improved functional strength and transfer efficiency.   Status On-going     PT SHORT TERM GOAL #3   Title Pt will improve TUG score to less than or equal to 15 seconds for decreased fall risk.   Status On-going     PT SHORT TERM GOAL #4   Title Pt will ambulate at least 300 ft with least restrictive assistive device modified independently, for improved household and short distance community gait.   Status On-going     PT SHORT TERM GOAL #5   Title Pt will verbalize understanding of local Parkinson's disease-related resources.   Status On-going           PT Long Term Goals - 09/25/16 1609      PT LONG TERM GOAL #1   Title Pt will verbalize understanding of fall prevention in the home environment.  TARGET 11/16/16   Status On-going     PT LONG TERM GOAL #2   Title Pt will improve TUG score to less than or equal to 13.5 seconds for decreased fall risk.   Status On-going     PT LONG TERM GOAL #3   Title Pt will improve TUG cognitive to less  than or equal to 15 seconds for decreased fall risk/improved dual tasking with gait.   Status On-going     PT LONG TERM GOAL #4   Title Pt will improve MiniBESTest score to at least 16/28 for decreased fall risk.   Status On-going     PT LONG TERM GOAL #5   Title Pt will improve gait velocity to at least 2 ft/sec for improved gait efficiency and safety.   Status On-going               Plan - 10/10/16 1616    Clinical Impression Statement Pt continues to demonstrate decreased L arm swing but impoved with cues.  Still needs min cues with PWR exercises and reinforcement for compliance with HEP.  Will continue to benefit from PT to maximize function.   PT Treatment/Interventions Functional mobility training;Gait training;Therapeutic activities;Therapeutic exercise;Neuromuscular re-education;Balance training;Patient/family education;DME Instruction;ADLs/Self Care Home Management   PT Next Visit Plan Initiate transfer training, hip,ankle, step strategies; cont PWR! exercises as indicated   Consulted and Agree with Plan of Care Patient      Patient will benefit from skilled therapeutic intervention in order to improve the following deficits and impairments:  Abnormal gait, Decreased balance, Decreased mobility, Decreased safety awareness, Difficulty walking, Postural dysfunction, Impaired tone, Decreased endurance, Decreased strength  Visit Diagnosis: Other abnormalities of gait and mobility  Unsteadiness on feet  Abnormal posture  Other symptoms and signs involving the nervous system     Problem List Patient Active Problem List   Diagnosis Date Noted  . Muscle weakness of lower extremity 01/17/2015  . Parkinson disease ([place]) 01/17/2015  . BV (bacterial vaginosis) 02/18/2013  . Fibroid uterus 11/13/2012  . Benign colonic polyp 10/02/2012      Laureen Abrahams, PT, DPT 10/10/16 4:19 PM    [facility] [address] [place], [place], [address] Phone: [phone]   Fax:  [phone]  Name: [name] [name] MRN: [id number] Date of Birth: Mar 27, 1951

## 2016-10-15 ENCOUNTER — Ambulatory Visit: Payer: 59 | Admitting: Physical Therapy

## 2016-10-15 ENCOUNTER — Ambulatory Visit: Payer: Medicare Other | Admitting: Physical Therapy

## 2016-10-15 DIAGNOSIS — R293 Abnormal posture: Secondary | ICD-10-CM | POA: Diagnosis not present

## 2016-10-15 DIAGNOSIS — R2681 Unsteadiness on feet: Secondary | ICD-10-CM

## 2016-10-15 DIAGNOSIS — R29818 Other symptoms and signs involving the nervous system: Secondary | ICD-10-CM

## 2016-10-15 DIAGNOSIS — R2689 Other abnormalities of gait and mobility: Secondary | ICD-10-CM

## 2016-10-15 NOTE — Therapy (Signed)
[facility] [address] [place], [place], [address] Phone: [phone]   Fax:  [phone]  Physical Therapy Treatment  Patient Details  Name: [name] [name] MRN: [id number] Date of Birth: [birth year] Referring Provider: Dr. Jannifer Franklin  Encounter Date: 10/15/2016      PT End of Session - 10/15/16 1610    Visit Number 5   Number of Visits 17   Date for PT Re-Evaluation 11/15/16   Authorization Type Medicare Primary, [employer] 2nd-GCODE every 10th visit   PT Start Time 1530   PT Stop Time 1609   PT Time Calculation (min) 39 min   Activity Tolerance Patient tolerated treatment well   Behavior During Therapy [facility] for tasks assessed/performed      Past Medical History:  Diagnosis Date  . Fibroid   . GERD (gastroesophageal reflux disease)   . Muscle weakness of lower extremity 01/17/2015  . Osteopenia   . Parkinson disease ([place]) 01/17/2015    Past Surgical History:  Procedure Laterality Date  . FINGER SURGERY    . MOUTH SURGERY    . TOE SURGERY    . TONSILLECTOMY    . TUBAL LIGATION      There were no vitals filed for this visit.      Subjective Assessment - 10/15/16 1533    Subjective feels a little stiff and nervous today.   Patient Stated Goals Pt's goal for therapy is to reduce rigidity, relax body, have more strength and stamina.   Currently in Pain? No/denies                         [facility] Adult PT Treatment/Exercise - 10/15/16 1534      Ambulation/Gait   Ambulation/Gait Assistance 5: Supervision   Ambulation/Gait Assistance Details cues for heelstrike and arm swing   Ambulation Distance (Feet) 1000 Feet   Assistive device None   Gait Pattern Step-through pattern     Exercises   Exercises Lumbar     Lumbar Exercises: Stretches   Lower Trunk Rotation 3 reps;30 seconds   Lower Trunk Rotation Limitations bil   Passive Hamstring Stretch Both;3 reps;30 seconds   Passive  Hamstring Stretch Limitations seated     Knee/Hip Exercises: Aerobic   Nustep SciFit L2.0 x 8 min; goal to keep SPM > 50             Balance Exercises - 10/15/16 1610      Balance Exercises: Standing   Rockerboard Anterior/posterior;Lateral;Head turns;10 reps;Intermittent UE support             PT Short Term Goals - 09/25/16 1608      PT SHORT TERM GOAL #1   Title Pt will perform HEP with family supervision, for improved balance, transfers, posture, mobility.  TARGET 10/16/16   Status On-going     PT SHORT TERM GOAL #2   Title Pt will perform at least 6 of 10 reps of sit<>stand transfers with minimal to no UE support, for improved functional strength and transfer efficiency.   Status On-going     PT SHORT TERM GOAL #3   Title Pt will improve TUG score to less than or equal to 15 seconds for decreased fall risk.   Status On-going     PT SHORT TERM GOAL #4   Title Pt will ambulate at least 300 ft with least restrictive assistive device modified independently, for improved household and short distance community gait.   Status  On-going     PT SHORT TERM GOAL #5   Title Pt will verbalize understanding of local Parkinson's disease-related resources.   Status On-going           PT Long Term Goals - 09/25/16 1609      PT LONG TERM GOAL #1   Title Pt will verbalize understanding of fall prevention in the home environment.  TARGET 11/16/16   Status On-going     PT LONG TERM GOAL #2   Title Pt will improve TUG score to less than or equal to 13.5 seconds for decreased fall risk.   Status On-going     PT LONG TERM GOAL #3   Title Pt will improve TUG cognitive to less than or equal to 15 seconds for decreased fall risk/improved dual tasking with gait.   Status On-going     PT LONG TERM GOAL #4   Title Pt will improve MiniBESTest score to at least 16/28 for decreased fall risk.   Status On-going     PT LONG TERM GOAL #5   Title Pt will improve gait velocity to at  least 2 ft/sec for improved gait efficiency and safety.   Status On-going               Plan - 10/15/16 1611    Clinical Impression Statement Progressing well with PT and reports decreased stiffness with exercises.  Provided information on [employer] Parkinsons group and cycle class at [employer].     PT Treatment/Interventions Functional mobility training;Gait training;Therapeutic activities;Therapeutic exercise;Neuromuscular re-education;Balance training;Patient/family education;DME Instruction;ADLs/Self Care Home Management   PT Next Visit Plan check STGs.   Consulted and Agree with Plan of Care Patient      Patient will benefit from skilled therapeutic intervention in order to improve the following deficits and impairments:  Abnormal gait, Decreased balance, Decreased mobility, Decreased safety awareness, Difficulty walking, Postural dysfunction, Impaired tone, Decreased endurance, Decreased strength  Visit Diagnosis: Other abnormalities of gait and mobility  Unsteadiness on feet  Abnormal posture  Other symptoms and signs involving the nervous system     Problem List Patient Active Problem List   Diagnosis Date Noted  . Muscle weakness of lower extremity 01/17/2015  . Parkinson disease ([place]) 01/17/2015  . BV (bacterial vaginosis) 02/18/2013  . Fibroid uterus 11/13/2012  . Benign colonic polyp 10/02/2012      Laureen Abrahams, PT, DPT 10/15/16 4:13 PM    [facility] [address] [place], [place], [address] Phone: [phone]   Fax:  [phone]  Name: [name] [name] MRN: [id number] Date of Birth: September 11, 1951

## 2016-10-16 ENCOUNTER — Ambulatory Visit: Payer: Medicare Other | Admitting: Physical Therapy

## 2016-10-17 ENCOUNTER — Ambulatory Visit: Payer: Medicare Other | Admitting: Physical Therapy

## 2016-10-17 ENCOUNTER — Telehealth: Payer: Self-pay | Admitting: Physical Therapy

## 2016-10-17 DIAGNOSIS — R2681 Unsteadiness on feet: Secondary | ICD-10-CM

## 2016-10-17 DIAGNOSIS — R29818 Other symptoms and signs involving the nervous system: Secondary | ICD-10-CM

## 2016-10-17 DIAGNOSIS — R293 Abnormal posture: Secondary | ICD-10-CM

## 2016-10-17 DIAGNOSIS — R2689 Other abnormalities of gait and mobility: Secondary | ICD-10-CM | POA: Diagnosis not present

## 2016-10-17 NOTE — Therapy (Signed)
[facility] [address] [place], [place], [address] Phone: [phone]   Fax:  [phone]  Physical Therapy Treatment  Patient Details  Name: [name] [name] MRN: [id number] Date of Birth: 02/07/1951 Referring Provider: Dr. Anne Hahn  Encounter Date: 10/17/2016      PT End of Session - 10/17/16 1417    Visit Number 6   Number of Visits 17   Date for PT Re-Evaluation 11/15/16   Authorization Type Medicare Primary, [employer] 2nd-GCODE every 10th visit   PT Start Time 1320   PT Stop Time 1403   PT Time Calculation (min) 43 min   Activity Tolerance Patient tolerated treatment well   Behavior During Therapy [facility] for tasks assessed/performed      Past Medical History:  Diagnosis Date  . Fibroid   . GERD (gastroesophageal reflux disease)   . Muscle weakness of lower extremity 01/17/2015  . Osteopenia   . Parkinson disease ([facility]) 01/17/2015    Past Surgical History:  Procedure Laterality Date  . FINGER SURGERY    . MOUTH SURGERY    . TOE SURGERY    . TONSILLECTOMY    . TUBAL LIGATION      There were no vitals filed for this visit.      Subjective Assessment - 10/17/16 1325    Subjective felt very stiff yesterday; "legs felt like lead."  had a busy day.  doing exercises at home.   Patient Stated Goals Pt's goal for therapy is to reduce rigidity, relax body, have more strength and stamina.   Currently in Pain? No/denies            [facility] PT Assessment - 10/17/16 1325      Ambulation/Gait   Ambulation/Gait Assistance 7: Independent   Ambulation/Gait Assistance Details pt with better awareness for heel strike and arm swing   Ambulation Distance (Feet) 875 Feet   Assistive device None   Gait Pattern Step-through pattern   Ambulation Surface Level;Indoor   Gait velocity 3.72 ft/sec  8.81 sec     Timed Up and Go Test   Normal TUG (seconds) 11.5   Manual TUG (seconds) 11.06   Cognitive TUG  (seconds) 12.78                     [facility] Adult PT Treatment/Exercise - 10/17/16 1325      Transfers   Transfers Sit to Stand;Stand to Sit   Sit to Stand 5: Supervision;Without upper extremity assist   Stand to Sit 5: Supervision;Without upper extremity assist   Number of Reps 10 reps   Comments needs min A from chair without UE support     Knee/Hip Exercises: Aerobic   Nustep SciFit L3.0 x 8 min; goal to keep SPM > 50     Knee/Hip Exercises: Standing   Heel Raises Both;20 reps   Heel Raises Limitations toe raises x 20   Hip Flexion Both;10 reps;Knee bent   Hip Flexion Limitations alt with 3#   Hip Abduction Both;10 reps;Knee straight   Abduction Limitations alt with 3#   Hip Extension Both;10 reps;Knee straight   Extension Limitations alt with 3#             Balance Exercises - 10/17/16 1412      Balance Exercises: Standing   Stepping Strategy Lateral;Foam/compliant surface;10 reps   Sidestepping Foam/compliant support  cues for increased step length             PT  Short Term Goals - 10/17/16 1417      PT SHORT TERM GOAL #1   Title Pt will perform HEP with family supervision, for improved balance, transfers, posture, mobility.  TARGET 10/16/16   Status Achieved     PT SHORT TERM GOAL #2   Title Pt will perform at least 6 of 10 reps of sit<>stand transfers with minimal to no UE support, for improved functional strength and transfer efficiency.   Status Achieved     PT SHORT TERM GOAL #3   Title Pt will improve TUG score to less than or equal to 15 seconds for decreased fall risk.   Status Achieved     PT SHORT TERM GOAL #4   Title Pt will ambulate at least 300 ft with least restrictive assistive device modified independently, for improved household and short distance community gait.   Status Achieved     PT SHORT TERM GOAL #5   Title Pt will verbalize understanding of local Parkinson's disease-related resources.   Baseline handouts issued  10/15/16   Status Achieved           PT Long Term Goals - 10/17/16 1418      PT LONG TERM GOAL #1   Title Pt will verbalize understanding of fall prevention in the home environment.  TARGET 11/16/16   Status On-going     PT LONG TERM GOAL #2   Title Pt will improve TUG score to less than or equal to 13.5 seconds for decreased fall risk.   Status Achieved     PT LONG TERM GOAL #3   Title Pt will improve TUG cognitive to less than or equal to 15 seconds for decreased fall risk/improved dual tasking with gait.   Status Achieved     PT LONG TERM GOAL #4   Title Pt will improve MiniBESTest score to at least 16/28 for decreased fall risk.   Status On-going     PT LONG TERM GOAL #5   Title Pt will improve gait velocity to at least 2 ft/sec for improved gait efficiency and safety.   Status Achieved               Plan - 10/17/16 1418    Clinical Impression Statement Pt has met all STGs and 3 LTGs at this time.  Demonstrates improvement in functional mobility and balance.  Continues to have some difficulty with compliant surface activities and will benefit from continued PT to progress towards remaining LTGs.     PT Treatment/Interventions Functional mobility training;Gait training;Therapeutic activities;Therapeutic exercise;Neuromuscular re-education;Balance training;Patient/family education;DME Instruction;ADLs/Self Care Home Management   PT Next Visit Plan continue PWR! moves; compliant surface activities and gait training   Consulted and Agree with Plan of Care Patient;Family member/caregiver   Family Member Consulted Daughter      Patient will benefit from skilled therapeutic intervention in order to improve the following deficits and impairments:  Abnormal gait, Decreased balance, Decreased mobility, Decreased safety awareness, Difficulty walking, Postural dysfunction, Impaired tone, Decreased endurance, Decreased strength  Visit Diagnosis: Other abnormalities of gait  and mobility  Unsteadiness on feet  Abnormal posture  Other symptoms and signs involving the nervous system     Problem List Patient Active Problem List   Diagnosis Date Noted  . Muscle weakness of lower extremity 01/17/2015  . Parkinson disease ([place]) 01/17/2015  . BV (bacterial vaginosis) 02/18/2013  . Fibroid uterus 11/13/2012  . Benign colonic polyp 10/02/2012       Laureen Abrahams, PT,  DPT 10/17/16 2:20 PM    [facility] [address] [place], [place], [address] Phone: [phone]   Fax:  [phone]  Name: [name] [name] MRN: [id number] Date of Birth: June 14, 1951

## 2016-10-17 NOTE — Telephone Encounter (Signed)
Dr. Jannifer Franklin-  Feel pt will benefit from occupational therapy to address UE stiffness, decreased coordination, and difficulty with ADLs.  If you agree, will you please send OT order to [facility] work queue?  Thank you- Laureen Abrahams, PT, DPT 10/17/16 2:23 PM

## 2016-10-17 NOTE — Telephone Encounter (Signed)
I will hold off on occupational therapy until I see the patient again on medications.

## 2016-10-22 ENCOUNTER — Ambulatory Visit: Payer: Medicare Other | Admitting: Physical Therapy

## 2016-10-22 ENCOUNTER — Telehealth: Payer: Self-pay | Admitting: Occupational Therapy

## 2016-10-22 ENCOUNTER — Ambulatory Visit: Payer: Medicare Other | Admitting: Occupational Therapy

## 2016-10-22 DIAGNOSIS — R29818 Other symptoms and signs involving the nervous system: Secondary | ICD-10-CM

## 2016-10-22 DIAGNOSIS — R293 Abnormal posture: Secondary | ICD-10-CM | POA: Diagnosis not present

## 2016-10-22 DIAGNOSIS — R2681 Unsteadiness on feet: Secondary | ICD-10-CM

## 2016-10-22 DIAGNOSIS — R2689 Other abnormalities of gait and mobility: Secondary | ICD-10-CM | POA: Diagnosis not present

## 2016-10-22 DIAGNOSIS — G20 Parkinson's disease: Secondary | ICD-10-CM

## 2016-10-22 NOTE — Telephone Encounter (Signed)
Dr. Jannifer [name], [name] [name] was seen by occupational therapy for a free Parkinson's screening. Pt demonstrates a decline in ADL performance, particularly with fastening buttons and and she reports intermittant right shoulder pain and tightness. Pt maintains MP's of LUE  in flexed position at rest which places her at risk for contracture. Pt is interested in receiving occupational therapy services to maximize safety and independence with ADLS and to decrease left shoulder pain. If you agree, please order an occupational therapy eval. Sincerely, Theone Murdoch, OTR/L

## 2016-10-22 NOTE — Therapy (Signed)
[facility] [address] [place], [place], [address] Phone: [phone]   Fax:  [phone]  Physical Therapy Treatment  Patient Details  Name: [name] [name] MRN: [id number] Date of Birth: [birth year] Referring Provider: Dr. Jannifer Franklin  Encounter Date: 10/22/2016      PT End of Session - 10/22/16 1612    Visit Number 7   Number of Visits 17   Date for PT Re-Evaluation 11/15/16   Authorization Type Medicare Primary, [employer] 2nd-GCODE every 10th visit   PT Start Time 1533   PT Stop Time 1611   PT Time Calculation (min) 38 min   Activity Tolerance Patient tolerated treatment well   Behavior During Therapy [facility] for tasks assessed/performed      Past Medical History:  Diagnosis Date  . Fibroid   . GERD (gastroesophageal reflux disease)   . Muscle weakness of lower extremity 01/17/2015  . Osteopenia   . Parkinson disease ([place]) 01/17/2015    Past Surgical History:  Procedure Laterality Date  . FINGER SURGERY    . MOUTH SURGERY    . TOE SURGERY    . TONSILLECTOMY    . TUBAL LIGATION      There were no vitals filed for this visit.      Subjective Assessment - 10/22/16 1539    Subjective reports some stiffness today; feels better once she's up and moving   Patient Stated Goals Pt's goal for therapy is to reduce rigidity, relax body, have more strength and stamina.   Currently in Pain? No/denies                         [facility] Adult PT Treatment/Exercise - 10/22/16 1539      Ambulation/Gait   Ambulation/Gait Assistance 7: Independent   Ambulation/Gait Assistance Details cues for heel strike and step length   Ambulation Distance (Feet) 700 Feet   Assistive device None   Gait Pattern Step-through pattern     Knee/Hip Exercises: Stretches   Passive Hamstring Stretch Both;3 reps;30 seconds   Passive Hamstring Stretch Limitations seated   Gastroc Stretch 3 reps;30 seconds   Gastroc  Stretch Limitations standing; bil     Knee/Hip Exercises: Aerobic   Tread Mill 0.7-1.0 mph x 5 min (2 standing pause breaks needed) with visual targets for large step length. difficulty with heel strike   Nustep SciFit L3.0 x 8 min; goal to keep SPM > 50             Balance Exercises - 10/22/16 1611      Balance Exercises: Standing   Other Standing Exercises zoom ball with alt step forwards then laterally with min cues for big movements and weight shifting             PT Short Term Goals - 10/17/16 1417      PT SHORT TERM GOAL #1   Title Pt will perform HEP with family supervision, for improved balance, transfers, posture, mobility.  TARGET 10/16/16   Status Achieved     PT SHORT TERM GOAL #2   Title Pt will perform at least 6 of 10 reps of sit<>stand transfers with minimal to no UE support, for improved functional strength and transfer efficiency.   Status Achieved     PT SHORT TERM GOAL #3   Title Pt will improve TUG score to less than or equal to 15 seconds for decreased fall risk.   Status Achieved  PT SHORT TERM GOAL #4   Title Pt will ambulate at least 300 ft with least restrictive assistive device modified independently, for improved household and short distance community gait.   Status Achieved     PT SHORT TERM GOAL #5   Title Pt will verbalize understanding of local Parkinson's disease-related resources.   Baseline handouts issued 10/15/16   Status Achieved           PT Long Term Goals - 10/17/16 1418      PT LONG TERM GOAL #1   Title Pt will verbalize understanding of fall prevention in the home environment.  TARGET 11/16/16   Status On-going     PT LONG TERM GOAL #2   Title Pt will improve TUG score to less than or equal to 13.5 seconds for decreased fall risk.   Status Achieved     PT LONG TERM GOAL #3   Title Pt will improve TUG cognitive to less than or equal to 15 seconds for decreased fall risk/improved dual tasking with gait.    Status Achieved     PT LONG TERM GOAL #4   Title Pt will improve MiniBESTest score to at least 16/28 for decreased fall risk.   Status On-going     PT LONG TERM GOAL #5   Title Pt will improve gait velocity to at least 2 ft/sec for improved gait efficiency and safety.   Status Achieved               Plan - 10/22/16 1612    Clinical Impression Statement Pt initially with inability to perform big step lengths on treadmill even with visual cues for step length.  Able to perform with very slow (0.5 mph) then progressed up to 1.0 mph.  Will continue to benefit from PT to maximize function.    PT Treatment/Interventions Functional mobility training;Gait training;Therapeutic activities;Therapeutic exercise;Neuromuscular re-education;Balance training;Patient/family education;DME Instruction;ADLs/Self Care Home Management   PT Next Visit Plan continue PWR! moves; compliant surface activities and gait training   Consulted and Agree with Plan of Care Patient      Patient will benefit from skilled therapeutic intervention in order to improve the following deficits and impairments:  Abnormal gait, Decreased balance, Decreased mobility, Decreased safety awareness, Difficulty walking, Postural dysfunction, Impaired tone, Decreased endurance, Decreased strength  Visit Diagnosis: Other abnormalities of gait and mobility  Unsteadiness on feet  Abnormal posture  Other symptoms and signs involving the nervous system     Problem List Patient Active Problem List   Diagnosis Date Noted  . Muscle weakness of lower extremity 01/17/2015  . Parkinson disease ([place]) 01/17/2015  . BV (bacterial vaginosis) 02/18/2013  . Fibroid uterus 11/13/2012  . Benign colonic polyp 10/02/2012      Laureen Abrahams, PT, DPT 10/22/16 4:14 PM    [facility] [address] [place], [place], [address] Phone: [phone]   Fax:   [phone]  Name: [name] [name] MRN: [id number] Date of Birth: 01/10/1951

## 2016-10-22 NOTE — Therapy (Signed)
[facility] [address] [place], [place], [address] Phone: [phone]   Fax:  [phone]  Patient Details  Name: [name] [name] MRN: [id number] Date of Birth: 1951/03/05 Referring Provider:  Aretta Nip, MD  Encounter Date: 10/22/2016  Occupational Therapy Parkinson's Disease Screen  Physical Performance Test item #2 (simulated eating):  11.78 secs sec  Physical Performance Test item #4 (donning/doffing jacket):  18.57 sec  3 button/ unbutton: 36 secs  9-hole peg test:    RUE  28.13 sec        LUE  27.84 sec  Box & Blocks Test:   RUE  48 blocks        LUE  43 blocks (pt reports fatigue with left hand use)  Change in ability to perform ADLs/IADLs:  Pt maintains MP's in flexion for left hand and pt is at risk for contracture/ tightness. Pt reports difficulty fastening buttons, opening and closing a ziploc bag and wrapping foil around food.  Other Comments:  Pt demonstrates legible handwriting however she reports increased micrographia after writing for a while.Pt reports hands cramp and writing is not fluid. Pt reports   Pt would benefit from occupational therapy evaluation due to:  Change in ADL performance and pt report of intermittant shoulder pain in left shoulder .   Uriel Horkey 10/22/2016, 4:14 PM  [facility] [address] [place], [place], [address] Phone: [phone]   Fax:  [phone]

## 2016-10-23 NOTE — Telephone Encounter (Signed)
I will order OT.

## 2016-10-25 ENCOUNTER — Ambulatory Visit: Payer: Medicare Other | Admitting: Physical Therapy

## 2016-10-25 DIAGNOSIS — R293 Abnormal posture: Secondary | ICD-10-CM | POA: Diagnosis not present

## 2016-10-25 DIAGNOSIS — R2681 Unsteadiness on feet: Secondary | ICD-10-CM | POA: Diagnosis not present

## 2016-10-25 DIAGNOSIS — R29818 Other symptoms and signs involving the nervous system: Secondary | ICD-10-CM | POA: Diagnosis not present

## 2016-10-25 DIAGNOSIS — R2689 Other abnormalities of gait and mobility: Secondary | ICD-10-CM

## 2016-10-25 NOTE — Therapy (Signed)
[facility] [address] [place], [place], [address] Phone: [phone]   Fax:  [phone]  Physical Therapy Treatment  Patient Details  Name: [name] [name] MRN: [id number] Date of Birth: 1951/06/02 Referring Provider: Dr. Jannifer Franklin  Encounter Date: 10/25/2016      PT End of Session - 10/25/16 1607    Visit Number 8   Number of Visits 17   Date for PT Re-Evaluation 11/15/16   Authorization Type Medicare Primary, [employer] 2nd-GCODE every 10th visit   PT Start Time 1531   PT Stop Time 1611   PT Time Calculation (min) 40 min   Activity Tolerance Patient tolerated treatment well   Behavior During Therapy [facility] for tasks assessed/performed      Past Medical History:  Diagnosis Date  . Fibroid   . GERD (gastroesophageal reflux disease)   . Muscle weakness of lower extremity 01/17/2015  . Osteopenia   . Parkinson disease ([place]) 01/17/2015    Past Surgical History:  Procedure Laterality Date  . FINGER SURGERY    . MOUTH SURGERY    . TOE SURGERY    . TONSILLECTOMY    . TUBAL LIGATION      There were no vitals filed for this visit.      Subjective Assessment - 10/25/16 1535    Subjective legs feel very heavy today   Patient Stated Goals Pt's goal for therapy is to reduce rigidity, relax body, have more strength and stamina.   Currently in Pain? No/denies                         [facility] Adult PT Treatment/Exercise - 10/25/16 1535      Lumbar Exercises: Stretches   Passive Hamstring Stretch 3 reps;30 seconds   Passive Hamstring Stretch Limitations bil; seated   Single Knee to Chest Stretch 3 reps;30 seconds   Single Knee to Chest Stretch Limitations bil   Lower Trunk Rotation 3 reps;30 seconds   Lower Trunk Rotation Limitations bil   Piriformis Stretch 3 reps;30 seconds   Piriformis Stretch Limitations bil; seated     Lumbar Exercises: Supine   Bridge 5 seconds;10 reps     Knee/Hip  Exercises: Aerobic   Nustep SciFit L2.0 x 8 min; goal to keep SPM > 50           PWR [facility]) - 10/25/16 1555    PWR! exercises Moves in supine;Moves in sitting   PWR! Up x20   PWR! Rock x10 bil   PWR! Twist x10 bil   PWR! Step x10 bil   Comments supine: mod cues for technique-pt reports limited compliance with HEP   PWR! Up x20   PWR! Rock x10 bil   PWR! Twist x10 bil   PWR! Step x10 bil   Comments sitting               PT Short Term Goals - 10/17/16 1417      PT SHORT TERM GOAL #1   Title Pt will perform HEP with family supervision, for improved balance, transfers, posture, mobility.  TARGET 10/16/16   Status Achieved     PT SHORT TERM GOAL #2   Title Pt will perform at least 6 of 10 reps of sit<>stand transfers with minimal to no UE support, for improved functional strength and transfer efficiency.   Status Achieved     PT SHORT TERM GOAL #3   Title Pt will improve TUG score  to less than or equal to 15 seconds for decreased fall risk.   Status Achieved     PT SHORT TERM GOAL #4   Title Pt will ambulate at least 300 ft with least restrictive assistive device modified independently, for improved household and short distance community gait.   Status Achieved     PT SHORT TERM GOAL #5   Title Pt will verbalize understanding of local Parkinson's disease-related resources.   Baseline handouts issued 10/15/16   Status Achieved           PT Long Term Goals - 10/17/16 1418      PT LONG TERM GOAL #1   Title Pt will verbalize understanding of fall prevention in the home environment.  TARGET 11/16/16   Status On-going     PT LONG TERM GOAL #2   Title Pt will improve TUG score to less than or equal to 13.5 seconds for decreased fall risk.   Status Achieved     PT LONG TERM GOAL #3   Title Pt will improve TUG cognitive to less than or equal to 15 seconds for decreased fall risk/improved dual tasking with gait.   Status Achieved     PT LONG TERM GOAL #4    Title Pt will improve MiniBESTest score to at least 16/28 for decreased fall risk.   Status On-going     PT LONG TERM GOAL #5   Title Pt will improve gait velocity to at least 2 ft/sec for improved gait efficiency and safety.   Status Achieved               Plan - 10/25/16 1608    Clinical Impression Statement Pt c/o increased stiffness today so session focused on stretches and PWR! moves in supine and sitting.  Pt continues to need min to mod cues for technique despite reporting compliance at home.  Will continue to benefit from PT to maximize function.   PT Treatment/Interventions Functional mobility training;Gait training;Therapeutic activities;Therapeutic exercise;Neuromuscular re-education;Balance training;Patient/family education;DME Instruction;ADLs/Self Care Home Management   PT Next Visit Plan continue PWR! moves; compliant surface activities and gait training   Consulted and Agree with Plan of Care Patient      Patient will benefit from skilled therapeutic intervention in order to improve the following deficits and impairments:  Abnormal gait, Decreased balance, Decreased mobility, Decreased safety awareness, Difficulty walking, Postural dysfunction, Impaired tone, Decreased endurance, Decreased strength  Visit Diagnosis: Other symptoms and signs involving the nervous system  Other abnormalities of gait and mobility  Unsteadiness on feet  Abnormal posture     Problem List Patient Active Problem List   Diagnosis Date Noted  . Muscle weakness of lower extremity 01/17/2015  . Parkinson disease ([place]) 01/17/2015  . BV (bacterial vaginosis) 02/18/2013  . Fibroid uterus 11/13/2012  . Benign colonic polyp 10/02/2012      Laureen Abrahams, PT, DPT 10/25/16 4:11 PM    [facility] [address] [place], [place], [address] Phone: [phone]   Fax:  [phone]  Name: [name] [name] MRN:  [id number] Date of Birth: August 29, 1951

## 2016-10-29 ENCOUNTER — Encounter: Payer: Self-pay | Admitting: Physical Therapy

## 2016-10-29 ENCOUNTER — Ambulatory Visit: Payer: Medicare Other | Attending: Neurology | Admitting: Physical Therapy

## 2016-10-29 DIAGNOSIS — R29898 Other symptoms and signs involving the musculoskeletal system: Secondary | ICD-10-CM | POA: Insufficient documentation

## 2016-10-29 DIAGNOSIS — R29818 Other symptoms and signs involving the nervous system: Secondary | ICD-10-CM | POA: Diagnosis not present

## 2016-10-29 DIAGNOSIS — R2689 Other abnormalities of gait and mobility: Secondary | ICD-10-CM | POA: Diagnosis not present

## 2016-10-29 DIAGNOSIS — R293 Abnormal posture: Secondary | ICD-10-CM | POA: Diagnosis not present

## 2016-10-29 DIAGNOSIS — R278 Other lack of coordination: Secondary | ICD-10-CM | POA: Insufficient documentation

## 2016-10-29 DIAGNOSIS — R2681 Unsteadiness on feet: Secondary | ICD-10-CM | POA: Diagnosis not present

## 2016-10-30 ENCOUNTER — Ambulatory Visit: Payer: Medicare Other | Admitting: Physical Therapy

## 2016-10-30 ENCOUNTER — Ambulatory Visit: Payer: Medicare Other | Admitting: Occupational Therapy

## 2016-10-30 DIAGNOSIS — R293 Abnormal posture: Secondary | ICD-10-CM | POA: Diagnosis not present

## 2016-10-30 DIAGNOSIS — R29818 Other symptoms and signs involving the nervous system: Secondary | ICD-10-CM

## 2016-10-30 DIAGNOSIS — R2689 Other abnormalities of gait and mobility: Secondary | ICD-10-CM | POA: Diagnosis not present

## 2016-10-30 DIAGNOSIS — R278 Other lack of coordination: Secondary | ICD-10-CM | POA: Diagnosis not present

## 2016-10-30 DIAGNOSIS — R29898 Other symptoms and signs involving the musculoskeletal system: Secondary | ICD-10-CM

## 2016-10-30 DIAGNOSIS — R2681 Unsteadiness on feet: Secondary | ICD-10-CM

## 2016-10-30 NOTE — Therapy (Signed)
[facility] [address] [place], [place], [address] Phone: [phone]   Fax:  [phone]  Physical Therapy Treatment  Patient Details  Name: [name] [name] MRN: [id number] Date of Birth: [birth year] Referring Provider: Dr. Jannifer Franklin  Encounter Date: 10/30/2016      PT End of Session - 10/30/16 1618    Visit Number 10   Number of Visits 17   Date for PT Re-Evaluation 11/15/16   Authorization Type Medicare Primary, [employer] 2nd-GCODE every 10th visit   PT Start Time 1533   PT Stop Time 1614   PT Time Calculation (min) 41 min   Activity Tolerance Patient tolerated treatment well   Behavior During Therapy [facility] for tasks assessed/performed      Past Medical History:  Diagnosis Date  . Fibroid   . GERD (gastroesophageal reflux disease)   . Muscle weakness of lower extremity 01/17/2015  . Osteopenia   . Parkinson disease ([place]) 01/17/2015    Past Surgical History:  Procedure Laterality Date  . FINGER SURGERY    . MOUTH SURGERY    . TOE SURGERY    . TONSILLECTOMY    . TUBAL LIGATION      There were no vitals filed for this visit.      Subjective Assessment - 10/30/16 1539    Subjective feels stiff today.  wants to ride the bike and do stretches this afternoon.   Patient Stated Goals Pt's goal for therapy is to reduce rigidity, relax body, have more strength and stamina.   Currently in Pain? No/denies            [facility] PT Assessment - 10/30/16 1554      Transfers   Five time sit to stand comments  16.88 sec     Ambulation/Gait   Gait velocity 2.95 ft/sec  11.09   Gait Comments amb x 12 min with focus on quick turns, backwards walking and quick stops.  Cues for picking feet up with turns; amb x 300' with min cues for arm swing and heel strike     Timed Up and Go Test   Normal TUG (seconds) 12.59   Manual TUG (seconds) 12.28   Cognitive TUG (seconds) 14.87                      [facility] Adult PT Treatment/Exercise - 10/30/16 1540      Lumbar Exercises: Stretches   Passive Hamstring Stretch 30 seconds;2 reps   Passive Hamstring Stretch Limitations bil; seated   Lower Trunk Rotation 2 reps;30 seconds   Lower Trunk Rotation Limitations bil   Piriformis Stretch 2 reps;30 seconds   Piriformis Stretch Limitations bil; seated     Knee/Hip Exercises: Aerobic   Nustep SciFit L2.0 x 8 min; goal to keep SPM > 50                PT Education - 10/30/16 1618    Education provided Yes   Education Details provided new copies of PWR! exercises per pt request   Person(s) Educated Patient   Methods Explanation   Comprehension Verbalized understanding          PT Short Term Goals - 10/17/16 1417      PT SHORT TERM GOAL #1   Title Pt will perform HEP with family supervision, for improved balance, transfers, posture, mobility.  TARGET 10/16/16   Status Achieved     PT SHORT TERM GOAL #2   Title Pt  will perform at least 6 of 10 reps of sit<>stand transfers with minimal to no UE support, for improved functional strength and transfer efficiency.   Status Achieved     PT SHORT TERM GOAL #3   Title Pt will improve TUG score to less than or equal to 15 seconds for decreased fall risk.   Status Achieved     PT SHORT TERM GOAL #4   Title Pt will ambulate at least 300 ft with least restrictive assistive device modified independently, for improved household and short distance community gait.   Status Achieved     PT SHORT TERM GOAL #5   Title Pt will verbalize understanding of local Parkinson's disease-related resources.   Baseline handouts issued 10/15/16   Status Achieved           PT Long Term Goals - 10/17/16 1418      PT LONG TERM GOAL #1   Title Pt will verbalize understanding of fall prevention in the home environment.  TARGET 11/16/16   Status On-going     PT LONG TERM GOAL #2   Title Pt will improve TUG score to less than or equal to 13.5 seconds  for decreased fall risk.   Status Achieved     PT LONG TERM GOAL #3   Title Pt will improve TUG cognitive to less than or equal to 15 seconds for decreased fall risk/improved dual tasking with gait.   Status Achieved     PT LONG TERM GOAL #4   Title Pt will improve MiniBESTest score to at least 16/28 for decreased fall risk.   Status On-going     PT LONG TERM GOAL #5   Title Pt will improve gait velocity to at least 2 ft/sec for improved gait efficiency and safety.   Status Achieved               Plan - 11-06-2016 1619    Clinical Impression Statement Pt with slight decrease in gait velocity as well as timed up and go scores; but pt now able to perform sit to stand without UE support.  Overall pt progressing well and anticipate pt will be ready for d/c at end of plan of care.   PT Treatment/Interventions Functional mobility training;Gait training;Therapeutic activities;Therapeutic exercise;Neuromuscular re-education;Balance training;Patient/family education;DME Instruction;ADLs/Self Care Home Management   PT Next Visit Plan continue gait, PWR! moves, balance exercises   Consulted and Agree with Plan of Care Patient      Patient will benefit from skilled therapeutic intervention in order to improve the following deficits and impairments:  Abnormal gait, Decreased balance, Decreased mobility, Decreased safety awareness, Difficulty walking, Postural dysfunction, Impaired tone, Decreased endurance, Decreased strength  Visit Diagnosis: Other symptoms and signs involving the nervous system  Other abnormalities of gait and mobility  Unsteadiness on feet       G-Codes - 11-06-2016 1622    Functional Assessment Tool Used gait velocity 2.95 ft/sec, TUG 12.59 sec, TUG manual 12.28 sec, TUG cog 14.87 sec; 5x sit<>stand 16.88 sec   Functional Limitation Mobility: Walking and moving around   Mobility: Walking and Moving Around Current Status [phone]) At least 20 percent but less than 40  percent impaired, limited or restricted   Mobility: Walking and Moving Around Goal Status [phone]) At least 20 percent but less than 40 percent impaired, limited or restricted      Problem List Patient Active Problem List   Diagnosis Date Noted  . Muscle weakness of lower extremity 01/17/2015  .  Parkinson disease ([place]) 01/17/2015  . BV (bacterial vaginosis) 02/18/2013  . Fibroid uterus 11/13/2012  . Benign colonic polyp 10/02/2012     Laureen Abrahams, PT, DPT 10/30/16 4:25 PM   [facility] [address] [place], [place], [address] Phone: [phone]   Fax:  [phone]  Name: [name] [name] MRN: [id number] Date of Birth: 28-Mar-1951      Physical Therapy Progress Note  Dates of Reporting Period: 09/16/16 to 10/30/16  Objective Reports of Subjective Statement: see above  Objective Measurements: see above  Goal Update: see above  Plan: continue through current POC with plan to transition to community fitness at that time  Reason Skilled Services are Required: to continue to progess towards LTGs to maximize functional mobility and decrease fall risk.  Laureen Abrahams, PT, DPT 10/30/16 4:26 PM  [facility] [address] [place], [place] [address]  [phone] (office) [phone] (fax)

## 2016-10-30 NOTE — Therapy (Signed)
[facility] [address] [place], [place], [address] Phone: [phone]   Fax:  [phone]  Physical Therapy Treatment  Patient Details  Name: [name] [name] MRN: [id number] Date of Birth: 08/14/1951 Referring Provider: Dr. Jannifer Franklin  Encounter Date: 10/29/2016      PT End of Session - 10/29/16 1637    Visit Number 9   Number of Visits 17   Date for PT Re-Evaluation 11/15/16   Authorization Type Medicare Primary, [employer] 2nd-GCODE every 10th visit   PT Start Time 1545   PT Stop Time 1630   PT Time Calculation (min) 45 min   Activity Tolerance Patient tolerated treatment well   Behavior During Therapy [facility] for tasks assessed/performed      Past Medical History:  Diagnosis Date  . Fibroid   . GERD (gastroesophageal reflux disease)   . Muscle weakness of lower extremity 01/17/2015  . Osteopenia   . Parkinson disease ([place]) 01/17/2015    Past Surgical History:  Procedure Laterality Date  . FINGER SURGERY    . MOUTH SURGERY    . TOE SURGERY    . TONSILLECTOMY    . TUBAL LIGATION      There were no vitals filed for this visit.      Subjective Assessment - 10/29/16 1547    Subjective She plans to join [facility] or exercise program after PT finishes.    Patient Stated Goals Pt's goal for therapy is to reduce rigidity, relax body, have more strength and stamina.   Currently in Pain? No/denies     Therapeutic Exercise: NuStep level 5 with BUEs & BLEs 5 min. PT instructed pt in proper set-up and pt verbalized understanding. PT gave pt info on [facility] as option for community fitness after discharge. Pt & dtr plan to look into it.   Therapeutic Activities: Sit to/from stand from chairs without armrests: PT demo, instructed proper weight shift. Pt requires use of 1 UE alternating UEs. Presence of stabilizing surface anterior improved weight shift. PT instructed to perform at home 5-10 reps after each  meal. Pt verbalized understanding. PT instructed in step length when negotiating around objects. Pt performed with tactile & verbal cues required for "bigger" steps. 90* & 180* turns with colored tiles as visual cues for "big" steps and near counter top for intermittent UE support.                               PT Short Term Goals - 10/17/16 1417      PT SHORT TERM GOAL #1   Title Pt will perform HEP with family supervision, for improved balance, transfers, posture, mobility.  TARGET 10/16/16   Status Achieved     PT SHORT TERM GOAL #2   Title Pt will perform at least 6 of 10 reps of sit<>stand transfers with minimal to no UE support, for improved functional strength and transfer efficiency.   Status Achieved     PT SHORT TERM GOAL #3   Title Pt will improve TUG score to less than or equal to 15 seconds for decreased fall risk.   Status Achieved     PT SHORT TERM GOAL #4   Title Pt will ambulate at least 300 ft with least restrictive assistive device modified independently, for improved household and short distance community gait.   Status Achieved     PT SHORT TERM GOAL #5   Title  Pt will verbalize understanding of local Parkinson's disease-related resources.   Baseline handouts issued 10/15/16   Status Achieved           PT Long Term Goals - 10/17/16 1418      PT LONG TERM GOAL #1   Title Pt will verbalize understanding of fall prevention in the home environment.  TARGET 11/16/16   Status On-going     PT LONG TERM GOAL #2   Title Pt will improve TUG score to less than or equal to 13.5 seconds for decreased fall risk.   Status Achieved     PT LONG TERM GOAL #3   Title Pt will improve TUG cognitive to less than or equal to 15 seconds for decreased fall risk/improved dual tasking with gait.   Status Achieved     PT LONG TERM GOAL #4   Title Pt will improve MiniBESTest score to at least 16/28 for decreased fall risk.   Status On-going     PT  LONG TERM GOAL #5   Title Pt will improve gait velocity to at least 2 ft/sec for improved gait efficiency and safety.   Status Achieved               Plan - 10/29/16 1638    Clinical Impression Statement Patient improved ability to weight shift and take bigger steps with visual cues of colored tiles. Her daughter & she verbalized understanding to start to look into exercise options once PT discharges.   PT Treatment/Interventions Functional mobility training;Gait training;Therapeutic activities;Therapeutic exercise;Neuromuscular re-education;Balance training;Patient/family education;DME Instruction;ADLs/Self Care Home Management   PT Next Visit Plan G-code & progress note. continue PWR! moves; compliant surface activities and gait training   Consulted and Agree with Plan of Care Patient      Patient will benefit from skilled therapeutic intervention in order to improve the following deficits and impairments:  Abnormal gait, Decreased balance, Decreased mobility, Decreased safety awareness, Difficulty walking, Postural dysfunction, Impaired tone, Decreased endurance, Decreased strength  Visit Diagnosis: Other symptoms and signs involving the nervous system  Other abnormalities of gait and mobility  Unsteadiness on feet  Abnormal posture     Problem List Patient Active Problem List   Diagnosis Date Noted  . Muscle weakness of lower extremity 01/17/2015  . Parkinson disease ([place]) 01/17/2015  . BV (bacterial vaginosis) 02/18/2013  . Fibroid uterus 11/13/2012  . Benign colonic polyp 10/02/2012    Jamey Reas PT, DPT 10/30/2016, 6:42 AM  [facility] [address] [place], [place], [address] Phone: [phone]   Fax:  [phone]  Name: [name] MRN: [id number] Date of Birth: [birth year]

## 2016-10-31 NOTE — Therapy (Signed)
[facility] [address] [place], [place], [address] Phone: [phone]   Fax:  [phone]  Occupational Therapy Evaluation  Patient Details  Name: [name] [name] MRN: [id number] Date of Birth: 1951-05-18 Referring Provider: Dr. Jannifer Franklin  Encounter Date: 10/30/2016      OT End of Session - 10/31/16 0923    Visit Number 1   Number of Visits 17   Date for OT Re-Evaluation 12/26/16   Authorization Type Medicare   Authorization - Visit Number 1   Authorization - Number of Visits 10   OT Start Time 1504   OT Stop Time 1530   OT Time Calculation (min) 26 min   Activity Tolerance Patient tolerated treatment well   Behavior During Therapy [facility] for tasks assessed/performed      Past Medical History:  Diagnosis Date  . Fibroid   . GERD (gastroesophageal reflux disease)   . Muscle weakness of lower extremity 01/17/2015  . Osteopenia   . Parkinson disease ([place]) 01/17/2015    Past Surgical History:  Procedure Laterality Date  . FINGER SURGERY    . MOUTH SURGERY    . TOE SURGERY    . TONSILLECTOMY    . TUBAL LIGATION      There were no vitals filed for this visit.      Subjective Assessment - 10/31/16 0929    Subjective  Pt with PD presents with decreased coordination, rigidity, bradykinesia, decreased balance and abnormal posture which impedes ADLs/IADLs.   Pertinent History see epic   Patient Stated Goals improve independence with ADLS/ IADLs   Currently in Pain? No/denies  shoulder pain at times 2-3/10 at end range RUE   Multiple Pain Sites No           [facility] OT Assessment - 10/31/16 0001      Assessment   Diagnosis Parkinson's disease   Referring Provider Dr. Jannifer Franklin   Onset Date 01/17/15   Prior Therapy PT     Precautions   Precautions [place] expects to be discharged to: Private residence   Home Access Level entry   Home Layout One level   Bathroom  Shower/Tub Walk-in Shower   Lives With Alone  family assists prn     Prior Function   Level of Independence Independent with basic ADLs;Independent with household mobility with device   Vocation Retired   Leisure Enjoys going to visit friends     ADL   Eating/Feeding Modified independent   Grooming Modified independent   Upper Body Bathing Modified independent  increased time   Lower Body Bathing Increased time  modified independent   Upper Body Dressing Increased time  difficulty fastening buttons   Lower Body Dressing Increased time   Toilet Tranfer Modified independent   Tub/Shower Transfer Modified independent  walk in shower      IADL   Shopping --  Prefers to have someone with her  for shopping    Meal Prep Able to complete simple warm meal prep  difficulty peeling/ cutting food    Financial Management Manages financial matters independently (budgets, writes checks, pays rent, bills goes to bank), collects and keeps track of income     Mobility   Mobility Status Independent     Written Expression   Handwriting 100% legible  Pt reports micrographia with fatigue     Vision - History   Additional Comments wears glasses for reading  Cognition   Overall Cognitive Status Cognition to be further assessed in functional context PRN  Pt denies cognitive changes     Observation/Other Assessments   Other Surveys  Select   Physical Performance Test   Yes   Simulated Eating Time (seconds) 11.78 secs(on 10/22/16)   Donning Doffing Jacket Time (seconds) 13.81 secs   Donning Doffing Jacket Comments 3 button/ unbutton:41 secs     Coordination   9 Hole Peg Test Right;Left   Right 9 Hole Peg Test 28.13 secs  teted 10/22/16   Left 9 Hole Peg Test 27.84 sec  tested 10/22/16   Box and Blocks RUE 48, LUE 43 (tested 10/22/16 at screen)     AROM   Overall AROM Comments right shoulder flexion 125* with soreness, left shoulder flexion 130*, right elbow -15*     RUE Tone    RUE Tone Mild     LUE Tone   LUE Tone Mild                           OT Short Term Goals - 10/31/16 [id number]      OT SHORT TERM GOAL #1   Title I with PD specific HEP.   Time 4   Period Weeks   Status New     OT SHORT TERM GOAL #2   Title Pt will verbalize understanding of adapted strategies for ADLs/ IADLS in order to maximize safety and independence   Time 4   Period Weeks   Status New     OT SHORT TERM GOAL #3   Title Pt will demonstrate improved independence with ADLS as evidenced by decreasing 3 button/ unbutton to 38 secs or less   Baseline 41 secs   Time 4   Period Weeks   Status New     OT SHORT TERM GOAL #4   Title Pt will demonstrate ability to retrieve a lightweight object with RUE at 130 shoulder flexion and -10 elbow ext with pain less than or equal to 2/10   Time 4   Period Weeks   Status New           OT Long Term Goals - 10/31/16 0900      OT LONG TERM GOAL #1   Title Pt will verbalize understanding of PD related community resources   Time 8   Period Weeks   Status New     OT LONG TERM GOAL #2   Title Pt will improve functional reaching  and decrease fall risk as evidenced by increasing bilateral standing functional reach to 10 inches or greater bilaterally.   Baseline RUE 7.5 inches, RUE 9.5 inches   Time 8   Period Weeks   Status New     OT LONG TERM GOAL #3   Title Pt will verbalize understanding of ways to prevent future PD related complications, including ways to reduce shoulder pain and tightness   Time 8   Period Weeks   Status New     OT LONG TERM GOAL #4   Title Pt will verbalize understanding of ways to keep thinking skills sharp.   Time 4   Period Weeks   Status New               Plan - 10/31/16 0914    Clinical Impression Statement Pt is a 66 year old female who presents to OP OT with Parkinson's disease, with history of GERD, fibroid tumors, muscle weakness,  anxiety.  Pt presents with abnormal  posture, rigidity, decreased timing and coordination decreased balance, decreased functional strength. Pt can benefit from skilled occupational therapy to mximize safety and indpendence with ADLs/IADLS and to maintain quality of life.   Rehab Potential Good   OT Frequency 2x / week  plus eval, may d/c after 6 weeks depending on pt progress   OT Duration 8 weeks   OT Treatment/Interventions Self-care/ADL training;Moist Heat;Fluidtherapy;DME and/or AE instruction;Splinting;Patient/family education;Balance training;Therapeutic exercises;Gait Training;Contrast Bath;Traction;Therapeutic activities;Ultrasound;Therapeutic exercise;Cognitive remediation/compensation;Passive range of motion;Functional Mobility Training;Neuromuscular education;Electrical Stimulation;Parrafin;Energy conservation;Manual Therapy   Plan [place]! coordination HEP, PWR! hands   Consulted and Agree with Plan of Care Patient      Patient will benefit from skilled therapeutic intervention in order to improve the following deficits and impairments:  Abnormal gait, Decreased coordination, Decreased range of motion, Difficulty walking, Impaired flexibility, Decreased safety awareness, Decreased endurance, Decreased activity tolerance, Decreased knowledge of precautions, Impaired tone, Decreased balance, Decreased knowledge of use of DME, Impaired UE functional use, Pain, Impaired vision/preception, Impaired perceived functional ability, Decreased strength, Decreased mobility, Decreased cognition  Visit Diagnosis: Other symptoms and signs involving the nervous system  Other abnormalities of gait and mobility  Abnormal posture  Unsteadiness on feet  Other lack of coordination  Other symptoms and signs involving the musculoskeletal system      G-Codes - 2016-11-13 1622    Functional Assessment Tool Used 3 button / unbutton: 41 secs, standing functional reach RUE 7.5 inches, LUE 9.5 inches   Functional Limitation Self care    Self Care Current Status [id number]) At least 20 percent but less than 40 percent impaired, limited or restricted   Self Care Goal Status [id number]) At least 1 percent but less than 20 percent impaired, limited or restricted      Problem List Patient Active Problem List   Diagnosis Date Noted  . Muscle weakness of lower extremity 01/17/2015  . Parkinson disease ([place]) 01/17/2015  . BV (bacterial vaginosis) 02/18/2013  . Fibroid uterus 11/13/2012  . Benign colonic polyp 10/02/2012    Mardi Cannady 10/31/2016, 9:31 AM Theone Murdoch, OTR/L [phone] Phone: [phone] 1:13 PM 10/31/16 [facility] [address] [place], [place], [address] Phone: [phone]   Fax:  [phone]  Name: CECELIE MONJARAS MRN: [id number] Date of Birth: 1951-05-24

## 2016-11-06 ENCOUNTER — Ambulatory Visit: Payer: Medicare Other | Admitting: Occupational Therapy

## 2016-11-06 ENCOUNTER — Ambulatory Visit: Payer: Medicare Other | Admitting: Physical Therapy

## 2016-11-06 DIAGNOSIS — R2689 Other abnormalities of gait and mobility: Secondary | ICD-10-CM

## 2016-11-06 DIAGNOSIS — R29818 Other symptoms and signs involving the nervous system: Secondary | ICD-10-CM | POA: Diagnosis not present

## 2016-11-06 DIAGNOSIS — R293 Abnormal posture: Secondary | ICD-10-CM | POA: Diagnosis not present

## 2016-11-06 DIAGNOSIS — R278 Other lack of coordination: Secondary | ICD-10-CM

## 2016-11-06 DIAGNOSIS — R2681 Unsteadiness on feet: Secondary | ICD-10-CM

## 2016-11-06 DIAGNOSIS — R29898 Other symptoms and signs involving the musculoskeletal system: Secondary | ICD-10-CM | POA: Diagnosis not present

## 2016-11-06 NOTE — Therapy (Signed)
[facility] [address] [place], [place], [address] Phone: [phone]   Fax:  [phone]  Physical Therapy Treatment  Patient Details  Name: [name] [name] MRN: [id number] Date of Birth: [birth year] Referring Provider: Dr. Jannifer Franklin  Encounter Date: 11/06/2016      PT End of Session - 11/06/16 1531    Visit Number 11   Number of Visits 17   Date for PT Re-Evaluation 11/15/16   Authorization Type Medicare Primary, [employer] 2nd-GCODE every 10th visit   PT Start Time 1445   PT Stop Time 1527   PT Time Calculation (min) 42 min   Activity Tolerance Patient tolerated treatment well   Behavior During Therapy [facility] for tasks assessed/performed      Past Medical History:  Diagnosis Date  . Fibroid   . GERD (gastroesophageal reflux disease)   . Muscle weakness of lower extremity 01/17/2015  . Osteopenia   . Parkinson disease ([place]) 01/17/2015    Past Surgical History:  Procedure Laterality Date  . FINGER SURGERY    . MOUTH SURGERY    . TOE SURGERY    . TONSILLECTOMY    . TUBAL LIGATION      There were no vitals filed for this visit.      Subjective Assessment - 11/06/16 1448    Subjective reports stiffness and anxiety today.  wants to ride the bike.     Patient Stated Goals Pt's goal for therapy is to reduce rigidity, relax body, have more strength and stamina.   Currently in Pain? No/denies                         [facility] Adult PT Treatment/Exercise - 11/06/16 1450      High Level Balance   High Level Balance Activities Weight-shifting turns;Turns   High Level Balance Comments cues for turns to decrease risk of tripping; repeated trials both direction with mod cues and improving technique with repetition     Knee/Hip Exercises: Aerobic   Nustep SciFit L3.0 x 8 min; goal to keep SPM > 50           PWR [facility]) - 11/06/16 1502    PWR! exercises Moves in sitting;Moves in standing   PWR! Up x20   PWR! Rock x10 bil   PWR! Twist x10 bil   PWR Step x10 bil   Comments standing; min cues for technique   PWR! Up x20   PWR! Rock x10 bil   PWR! Twist x10 bil   PWR! Step x10 bil   Comments sitting          Balance Exercises - 11/06/16 1531      Balance Exercises: Standing   Other Standing Exercises zoom ball with alt step forwards, laterally, and backwards with min cues for big movements and weight shifting           PT Education - 11/06/16 1534    Education provided Yes   Education Details parkinson's cycle class   Person(s) Educated Patient   Methods Explanation   Comprehension Verbalized understanding          PT Short Term Goals - 10/17/16 1417      PT SHORT TERM GOAL #1   Title Pt will perform HEP with family supervision, for improved balance, transfers, posture, mobility.  TARGET 10/16/16   Status Achieved     PT SHORT TERM GOAL #2   Title Pt will perform at least  6 of 10 reps of sit<>stand transfers with minimal to no UE support, for improved functional strength and transfer efficiency.   Status Achieved     PT SHORT TERM GOAL #3   Title Pt will improve TUG score to less than or equal to 15 seconds for decreased fall risk.   Status Achieved     PT SHORT TERM GOAL #4   Title Pt will ambulate at least 300 ft with least restrictive assistive device modified independently, for improved household and short distance community gait.   Status Achieved     PT SHORT TERM GOAL #5   Title Pt will verbalize understanding of local Parkinson's disease-related resources.   Baseline handouts issued 10/15/16   Status Achieved           PT Long Term Goals - 10/17/16 1418      PT LONG TERM GOAL #1   Title Pt will verbalize understanding of fall prevention in the home environment.  TARGET 11/16/16   Status On-going     PT LONG TERM GOAL #2   Title Pt will improve TUG score to less than or equal to 13.5 seconds for decreased fall risk.   Status  Achieved     PT LONG TERM GOAL #3   Title Pt will improve TUG cognitive to less than or equal to 15 seconds for decreased fall risk/improved dual tasking with gait.   Status Achieved     PT LONG TERM GOAL #4   Title Pt will improve MiniBESTest score to at least 16/28 for decreased fall risk.   Status On-going     PT LONG TERM GOAL #5   Title Pt will improve gait velocity to at least 2 ft/sec for improved gait efficiency and safety.   Status Achieved               Plan - 11/06/16 1531    Clinical Impression Statement Session today focused on PWR! moves and application to functional activities especially turning.  Improved turns with repetition.  Pt due to finish assessing LTGs next week and will discuss continuing v/s d/c next week.   PT Treatment/Interventions Functional mobility training;Gait training;Therapeutic activities;Therapeutic exercise;Neuromuscular re-education;Balance training;Patient/family education;DME Instruction;ADLs/Self Care Home Management   PT Next Visit Plan continue gait, PWR! moves, balance exercises   Consulted and Agree with Plan of Care Patient      Patient will benefit from skilled therapeutic intervention in order to improve the following deficits and impairments:  Abnormal gait, Decreased balance, Decreased mobility, Decreased safety awareness, Difficulty walking, Postural dysfunction, Impaired tone, Decreased endurance, Decreased strength  Visit Diagnosis: Other symptoms and signs involving the nervous system  Other abnormalities of gait and mobility  Unsteadiness on feet     Problem List Patient Active Problem List   Diagnosis Date Noted  . Muscle weakness of lower extremity 01/17/2015  . Parkinson disease ([place]) 01/17/2015  . BV (bacterial vaginosis) 02/18/2013  . Fibroid uterus 11/13/2012  . Benign colonic polyp 10/02/2012      Laureen Abrahams, PT, DPT 11/06/16 3:37 PM    [facility] [address] [place], [place], [address] Phone: [phone]   Fax:  [phone]  Name: [name] [name] MRN: [id number] Date of Birth: 06/29/1951

## 2016-11-06 NOTE — Patient Instructions (Signed)
Coordination Exercises  Perform the following exercises for 20 minutes 1 times per day. Perform with both hand(s). Perform using big movements.   Flipping Cards: Place deck of cards on the table. Flip cards over by opening your hand big to grasp and then turn your palm up big.  Deal cards: Hold 1/2 or whole deck in your hand. Use thumb to push card off top of deck with one big push.  Rotate ball with fingertips: Pick up with fingers/thumb and move as much as you can with each turn/movement (clockwise and counter-clockwise).  Toss ball from one hand to the other: Toss big/high.  Toss ball in the air and catch with the same hand: Toss big/high.  Pick up coins and stack one at a time: Pick up with big, intentional movements. Do not drag coin to the edge. (5-10 in a stack)  Pick up 5-10 coins one at a time and hold in palm. Then, move coins from palm to fingertips one at time and place in coin bank/container.  Pick up 5-10 coins one at a time and hold in palm. Then, move coins from palm to fingertips one at a time to stack.  Practice writing: Slow down, write big, and focus on forming each letter.  Perform "Flicks"/hand stretches (PWR! Hands): Close hands then flick out your fingers with focus on opening hands, pulling wrists back, and extending elbows like you are pushing.

## 2016-11-07 ENCOUNTER — Ambulatory Visit: Payer: Medicare Other | Admitting: Physical Therapy

## 2016-11-07 ENCOUNTER — Encounter: Payer: 59 | Admitting: Occupational Therapy

## 2016-11-07 DIAGNOSIS — R2689 Other abnormalities of gait and mobility: Secondary | ICD-10-CM | POA: Diagnosis not present

## 2016-11-07 DIAGNOSIS — R29818 Other symptoms and signs involving the nervous system: Secondary | ICD-10-CM

## 2016-11-07 DIAGNOSIS — R293 Abnormal posture: Secondary | ICD-10-CM | POA: Diagnosis not present

## 2016-11-07 DIAGNOSIS — R2681 Unsteadiness on feet: Secondary | ICD-10-CM

## 2016-11-07 DIAGNOSIS — R278 Other lack of coordination: Secondary | ICD-10-CM | POA: Diagnosis not present

## 2016-11-07 DIAGNOSIS — R29898 Other symptoms and signs involving the musculoskeletal system: Secondary | ICD-10-CM | POA: Diagnosis not present

## 2016-11-07 NOTE — Therapy (Signed)
[facility] [address] [place], [place], [address] Phone: [phone]   Fax:  [phone]  Physical Therapy Treatment  Patient Details  Name: [name] [name] MRN: [id number] Date of Birth: [birth year] Referring Provider: Dr. Jannifer Franklin  Encounter Date: 11/07/2016      PT End of Session - 11/07/16 1206    Visit Number 12   Number of Visits 17   Date for PT Re-Evaluation 11/15/16   Authorization Type Medicare Primary, [employer] 2nd-GCODE every 10th visit   PT Start Time 1103   PT Stop Time 1145   PT Time Calculation (min) 42 min   Equipment Utilized During Treatment Gait belt   Activity Tolerance Patient tolerated treatment well   Behavior During Therapy WFL for tasks assessed/performed      Past Medical History:  Diagnosis Date  . Fibroid   . GERD (gastroesophageal reflux disease)   . Muscle weakness of lower extremity 01/17/2015  . Osteopenia   . Parkinson disease ([place]) 01/17/2015    Past Surgical History:  Procedure Laterality Date  . FINGER SURGERY    . MOUTH SURGERY    . TOE SURGERY    . TONSILLECTOMY    . TUBAL LIGATION      There were no vitals filed for this visit.      Subjective Assessment - 11/07/16 1107    Subjective "My body hasn't quite woken up yet.  It doesn't wake up until about 1:00."   Patient Stated Goals Pt's goal for therapy is to reduce rigidity, relax body, have more strength and stamina.   Currently in Pain? No/denies                         [facility] Adult PT Treatment/Exercise - 11/07/16 1107      High Level Balance   High Level Balance Activities Weight-shifting turns;Turns   High Level Balance Comments cues for turns to decrease risk of tripping; repeated trials both direction with mod cues and improving technique with repetition     Lumbar Exercises: Stretches   Single Knee to Chest Stretch 3 reps;30 seconds   Single Knee to Chest Stretch Limitations  bil   Lower Trunk Rotation 3 reps;30 seconds   Lower Trunk Rotation Limitations bil     Knee/Hip Exercises: Stretches   Passive Hamstring Stretch Both;3 reps;30 seconds   Passive Hamstring Stretch Limitations seated     Knee/Hip Exercises: Aerobic   Nustep SciFit L3.0 x 8 min; goal to keep SPM > 40     Knee/Hip Exercises: Supine   Bridges Limitations 2x10   Straight Leg Raises Both;10 reps   Straight Leg Raises Limitations 2#     Knee/Hip Exercises: Sidelying   Hip ABduction Both;10 reps   Hip ABduction Limitations 2#           PWR [facility]) - 11/06/16 1502    PWR! exercises Moves in sitting;Moves in standing   PWR! Up x20   PWR! Rock x10 bil   PWR! Twist x10 bil   PWR Step x10 bil   Comments standing; min cues for technique   PWR! Up x20   PWR! Rock x10 bil   PWR! Twist x10 bil   PWR! Step x10 bil   Comments sitting          Balance Exercises - 11/06/16 1531      Balance Exercises: Standing   Other Standing Exercises zoom ball with alt step forwards, laterally,  and backwards with min cues for big movements and weight shifting           PT Education - 11/06/16 1534    Education provided Yes   Education Details parkinson's cycle class   Person(s) Educated Patient   Methods Explanation   Comprehension Verbalized understanding          PT Short Term Goals - 10/17/16 1417      PT SHORT TERM GOAL #1   Title Pt will perform HEP with family supervision, for improved balance, transfers, posture, mobility.  TARGET 10/16/16   Status Achieved     PT SHORT TERM GOAL #2   Title Pt will perform at least 6 of 10 reps of sit<>stand transfers with minimal to no UE support, for improved functional strength and transfer efficiency.   Status Achieved     PT SHORT TERM GOAL #3   Title Pt will improve TUG score to less than or equal to 15 seconds for decreased fall risk.   Status Achieved     PT SHORT TERM GOAL #4   Title Pt will ambulate at least 300 ft with  least restrictive assistive device modified independently, for improved household and short distance community gait.   Status Achieved     PT SHORT TERM GOAL #5   Title Pt will verbalize understanding of local Parkinson's disease-related resources.   Baseline handouts issued 10/15/16   Status Achieved           PT Long Term Goals - 10/17/16 1418      PT LONG TERM GOAL #1   Title Pt will verbalize understanding of fall prevention in the home environment.  TARGET 11/16/16   Status On-going     PT LONG TERM GOAL #2   Title Pt will improve TUG score to less than or equal to 13.5 seconds for decreased fall risk.   Status Achieved     PT LONG TERM GOAL #3   Title Pt will improve TUG cognitive to less than or equal to 15 seconds for decreased fall risk/improved dual tasking with gait.   Status Achieved     PT LONG TERM GOAL #4   Title Pt will improve MiniBESTest score to at least 16/28 for decreased fall risk.   Status On-going     PT LONG TERM GOAL #5   Title Pt will improve gait velocity to at least 2 ft/sec for improved gait efficiency and safety.   Status Achieved               Plan - 11/07/16 1206    Clinical Impression Statement Session today focused on lower extremity strengthening and working on 180 degree turns.  Progressing well towards goals with anticipation of transition to community and home program next week.     PT Treatment/Interventions Functional mobility training;Gait training;Therapeutic activities;Therapeutic exercise;Neuromuscular re-education;Balance training;Patient/family education;DME Instruction;ADLs/Self Care Home Management   PT Next Visit Plan continue gait, PWR! moves, balance exercises, strengthening   Consulted and Agree with Plan of Care Patient      Patient will benefit from skilled therapeutic intervention in order to improve the following deficits and impairments:  Abnormal gait, Decreased balance, Decreased mobility, Decreased safety  awareness, Difficulty walking, Postural dysfunction, Impaired tone, Decreased endurance, Decreased strength  Visit Diagnosis: Other symptoms and signs involving the nervous system  Other abnormalities of gait and mobility  Unsteadiness on feet     Problem List Patient Active Problem List   Diagnosis Date Noted  .  Muscle weakness of lower extremity 01/17/2015  . Parkinson disease ([place]) 01/17/2015  . BV (bacterial vaginosis) 02/18/2013  . Fibroid uterus 11/13/2012  . Benign colonic polyp 10/02/2012      Laureen Abrahams, PT, DPT 11/07/16 12:08 PM    [facility] [address] [place], [place], [address] Phone: [phone]   Fax:  [phone]  Name: [name] [name] MRN: [id number] Date of Birth: [birth year]

## 2016-11-07 NOTE — Therapy (Signed)
[facility] [address] [place], [place], [address] Phone: [phone]   Fax:  [phone]  Occupational Therapy Treatment  Patient Details  Name: [name] [name] MRN: [id number] Date of Birth: 1950/12/30 Referring Provider: Dr. Jannifer Franklin  Encounter Date: 11/06/2016      OT End of Session - 11/07/16 1234    Visit Number 2   Number of Visits 17   Date for OT Re-Evaluation 12/26/16   Authorization Type Medicare   Authorization - Visit Number 2   Authorization - Number of Visits 10   OT Start Time [id number]   OT Stop Time 1630   OT Time Calculation (min) 45 min   Activity Tolerance Patient tolerated treatment well   Behavior During Therapy [facility] for tasks assessed/performed      Past Medical History:  Diagnosis Date  . Fibroid   . GERD (gastroesophageal reflux disease)   . Muscle weakness of lower extremity 01/17/2015  . Osteopenia   . Parkinson disease ([place]) 01/17/2015    Past Surgical History:  Procedure Laterality Date  . FINGER SURGERY    . MOUTH SURGERY    . TOE SURGERY    . TONSILLECTOMY    . TUBAL LIGATION      There were no vitals filed for this visit.      Subjective Assessment - 11/06/16 1600    Subjective  Denies pain, just tightness   Pertinent History see epic   Patient Stated Goals improve independence with ADLS/ IADLs   Currently in Pain? No/denies                         [facility]) - 11/07/16 1237    PWR! exercises Moves in supine   PWR! Up x10   PWR! Rock [employer]! Twist x20   Comments min v.c. for technique             OT Education - 11/07/16 1239    Education provided Yes   Education Details coordination HEP   Person(s) Educated Patient   Methods Explanation;Demonstration;Verbal cues;Handout   Comprehension Verbalized understanding;Returned demonstration;Verbal cues required          OT Short Term Goals - 10/31/16 0859      OT SHORT TERM GOAL #1   Title I with PD specific HEP.   Time 4   Period Weeks   Status New     OT SHORT TERM GOAL #2   Title Pt will verbalize understanding of adapted strategies for ADLs/ IADLS in order to maximize safety and independence   Time 4   Period Weeks   Status New     OT SHORT TERM GOAL #3   Title Pt will demonstrate improved independnece with ADLS as evidenced by decreasing 3 button/ unbutton to 38 secs or less   Baseline 41 secs   Time 4   Period Weeks   Status New     OT SHORT TERM GOAL #4   Title Pt will demonstrate ability to retrieve a lightweight object with RUE at 130 shoulder flexion and -10 elbow ext with pain less than or equal to 2/10   Time 4   Period Weeks   Status New           OT Long Term Goals - 10/31/16 0900      OT LONG TERM GOAL #1   Title Pt will verbalize understanding of PD related community resources   Time  8   Period Weeks   Status New     OT LONG TERM GOAL #2   Title Pt will improve functional reaching  and decrease fall risk as evidenced by increasing bilateral standing functional reach to 10 inches or greater bilaterally.   Baseline RUE 7.5 inches, RUE 9.5 inches   Time 8   Period Weeks   Status New     OT LONG TERM GOAL #3   Title Pt will verbalize understanding of ways to prevent future PD related complications, including ways to reduce shoulder pain and tightness   Time 8   Period Weeks   Status New     OT LONG TERM GOAL #4   Title Pt will verbalize understanding of ways to keep thinking skills sharp.   Time 4   Period Weeks   Status New               Plan - 11/07/16 1228    Clinical Impression Statement Pt demonstrates understanding of coordination HEP.   Rehab Potential Good   OT Frequency 2x / week   OT Duration 8 weeks   OT Treatment/Interventions Self-care/ADL training;Moist Heat;Fluidtherapy;DME and/or AE instruction;Splinting;Patient/family education;Balance training;Therapeutic exercises;Gait Training;Contrast  Bath;Traction;Therapeutic activities;Ultrasound;Therapeutic exercise;Cognitive remediation/compensation;Passive range of motion;Functional Mobility Training;Neuromuscular education;Electrical Stimulation;Parrafin;Energy conservation;Manual Therapy   Plan [place]! coordination HEP   Consulted and Agree with Plan of Care Patient      Patient will benefit from skilled therapeutic intervention in order to improve the following deficits and impairments:  Abnormal gait, Decreased coordination, Decreased range of motion, Difficulty walking, Impaired flexibility, Decreased safety awareness, Decreased endurance, Decreased activity tolerance, Decreased knowledge of precautions, Impaired tone, Decreased balance, Decreased knowledge of use of DME, Impaired UE functional use, Pain, Impaired vision/preception, Impaired perceived functional ability, Decreased strength, Decreased mobility, Decreased cognition  Visit Diagnosis: No diagnosis found.    Problem List Patient Active Problem List   Diagnosis Date Noted  . Muscle weakness of lower extremity 01/17/2015  . Parkinson disease ([place]) 01/17/2015  . BV (bacterial vaginosis) 02/18/2013  . Fibroid uterus 11/13/2012  . Benign colonic polyp 10/02/2012    [name] [name] 11/07/2016, 12:40 PM [name] [name], OTR/L [phone] Phone: [phone] 12:40 PM 11/07/16 [facility] [address] [place], [place], [address] Phone: [phone]   Fax:  [phone]  Name: [name] [name] MRN: [id number] Date of Birth: May 22, 1951

## 2016-11-11 ENCOUNTER — Ambulatory Visit: Payer: Medicare Other | Admitting: Physical Therapy

## 2016-11-12 ENCOUNTER — Ambulatory Visit: Payer: Medicare Other | Admitting: Occupational Therapy

## 2016-11-12 ENCOUNTER — Ambulatory Visit: Payer: Medicare Other | Admitting: Physical Therapy

## 2016-11-12 DIAGNOSIS — R293 Abnormal posture: Secondary | ICD-10-CM

## 2016-11-12 DIAGNOSIS — R29818 Other symptoms and signs involving the nervous system: Secondary | ICD-10-CM

## 2016-11-12 DIAGNOSIS — R2681 Unsteadiness on feet: Secondary | ICD-10-CM

## 2016-11-12 DIAGNOSIS — R278 Other lack of coordination: Secondary | ICD-10-CM | POA: Diagnosis not present

## 2016-11-12 DIAGNOSIS — R29898 Other symptoms and signs involving the musculoskeletal system: Secondary | ICD-10-CM | POA: Diagnosis not present

## 2016-11-12 DIAGNOSIS — R2689 Other abnormalities of gait and mobility: Secondary | ICD-10-CM

## 2016-11-12 NOTE — Therapy (Signed)
[facility] [address] [place], [place], [address] Phone: [phone]   Fax:  [phone]  Occupational Therapy Treatment  Patient Details  Name: [name] [name] MRN: [id number] Date of Birth: [birth year] Referring Provider: Dr. Jannifer Franklin  Encounter Date: 11/12/2016      OT End of Session - 11/12/16 1436    Visit Number 3   Number of Visits 17   Date for OT Re-Evaluation 12/26/16   Authorization Type Medicare   Authorization - Visit Number 3   Authorization - Number of Visits 10   OT Start Time [id number]   OT Stop Time 1445   OT Time Calculation (min) 40 min   Activity Tolerance Patient tolerated treatment well   Behavior During Therapy [facility] for tasks assessed/performed      Past Medical History:  Diagnosis Date  . Fibroid   . GERD (gastroesophageal reflux disease)   . Muscle weakness of lower extremity 01/17/2015  . Osteopenia   . Parkinson disease ([place]) 01/17/2015    Past Surgical History:  Procedure Laterality Date  . FINGER SURGERY    . MOUTH SURGERY    . TOE SURGERY    . TONSILLECTOMY    . TUBAL LIGATION      There were no vitals filed for this visit.      Subjective Assessment - 11/12/16 1426    Subjective  Denies pain, just tightness   Pertinent History see epic   Patient Stated Goals improve independence with ADLS/ IADLs   Currently in Pain? No/denies           Standing for dynamic step and reach to copy small peg design,(for cognitive component) min-mod v.c. For large amplitude movements, finger extension Arm bike x 5 mins level 1 for conditioning, pt maintained 28-35 rpm.              PWR ([facility]) - 11/12/16 1438    PWR! exercises Moves in [place]! Up x10   PWR! Rock [employer]! Twist x20   PWR! Step x10   Comments mod v.c and demonstration for large amplitude movements             OT Education - 11/12/16 1440    Education provided Yes   Education Details  quadraped PWR!   Person(s) Educated Patient   Methods Explanation;Demonstration;Verbal cues;Handout   Comprehension Verbalized understanding;Returned demonstration          OT Short Term Goals - 10/31/16 0859      OT SHORT TERM GOAL #1   Title I with PD specific HEP.   Time 4   Period Weeks   Status New     OT SHORT TERM GOAL #2   Title Pt will verbalize understanding of adapted strategies for ADLs/ IADLS in order to maximize safety and independence   Time 4   Period Weeks   Status New     OT SHORT TERM GOAL #3   Title Pt will demonstrate improved independnece with ADLS as evidenced by decreasing 3 button/ unbutton to 38 secs or less   Baseline 41 secs   Time 4   Period Weeks   Status New     OT SHORT TERM GOAL #4   Title Pt will demonstrate ability to retrieve a lightweight object with RUE at 130 shoulder flexion and -10 elbow ext with pain less than or equal to 2/10   Time 4   Period Weeks   Status New  OT Long Term Goals - 10/31/16 0900      OT LONG TERM GOAL #1   Title Pt will verbalize understanding of PD related community resources   Time 8   Period Weeks   Status New     OT LONG TERM GOAL #2   Title Pt will improve functional reaching  and decrease fall risk as evidenced by increasing bilateral standing functional reach to 10 inches or greater bilaterally.   Baseline RUE 7.5 inches, RUE 9.5 inches   Time 8   Period Weeks   Status New     OT LONG TERM GOAL #3   Title Pt will verbalize understanding of ways to prevent future PD related complications, including ways to reduce shoulder pain and tightness   Time 8   Period Weeks   Status New     OT LONG TERM GOAL #4   Title Pt will verbalize understanding of ways to keep thinking skills sharp.   Time 4   Period Weeks   Status New               Plan - 11/12/16 1433    Clinical Impression Statement Pt is progressing towards goals for PD specific HEP.   Rehab Potential Good   OT  Frequency 2x / week   OT Duration 8 weeks   OT Treatment/Interventions Self-care/ADL training;Moist Heat;Fluidtherapy;DME and/or AE instruction;Splinting;Patient/family education;Balance training;Therapeutic exercises;Gait Training;Contrast Bath;Traction;Therapeutic activities;Ultrasound;Therapeutic exercise;Cognitive remediation/compensation;Passive range of motion;Functional Mobility Training;Neuromuscular education;Electrical Stimulation;Parrafin;Energy conservation;Manual Therapy   Plan reinforce quadraped PWR! adapted strategies for ADLS   Consulted and Agree with Plan of Care Patient      Patient will benefit from skilled therapeutic intervention in order to improve the following deficits and impairments:  Abnormal gait, Decreased coordination, Decreased range of motion, Difficulty walking, Impaired flexibility, Decreased safety awareness, Decreased endurance, Decreased activity tolerance, Decreased knowledge of precautions, Impaired tone, Decreased balance, Decreased knowledge of use of DME, Impaired UE functional use, Pain, Impaired vision/preception, Impaired perceived functional ability, Decreased strength, Decreased mobility, Decreased cognition  Visit Diagnosis: Other symptoms and signs involving the nervous system  Other lack of coordination  Other symptoms and signs involving the musculoskeletal system  Abnormal posture  Unsteadiness on feet    Problem List Patient Active Problem List   Diagnosis Date Noted  . Muscle weakness of lower extremity 01/17/2015  . Parkinson disease ([place]) 01/17/2015  . BV (bacterial vaginosis) 02/18/2013  . Fibroid uterus 11/13/2012  . Benign colonic polyp 10/02/2012    Ollivander See 11/12/2016, 2:41 PM  [facility] [address] [place], [place], [address] Phone: [phone]   Fax:  [phone]  Name: IFRA URIVE MRN: [id number] Date of Birth: [birth year]

## 2016-11-13 NOTE — Therapy (Signed)
[facility] [address] [place], [place], [address] Phone: [phone]   Fax:  [phone]  Physical Therapy Treatment  Patient Details  Name: [name] [name] MRN: [id number] Date of Birth: 1951-02-08 Referring Provider: Dr. Jannifer Franklin  Encounter Date: 11/12/2016      PT End of Session - 11/13/16 0836    Visit Number 13   Number of Visits 21  per recert [id number]   Date for PT Re-Evaluation 01/12/17   Authorization Type Medicare Primary, [employer] 2nd-GCODE every 10th visit   PT Start Time 1322   PT Stop Time 1402   PT Time Calculation (min) 40 min   Activity Tolerance Patient tolerated treatment well   Behavior During Therapy [facility] for tasks assessed/performed      Past Medical History:  Diagnosis Date  . Fibroid   . GERD (gastroesophageal reflux disease)   . Muscle weakness of lower extremity 01/17/2015  . Osteopenia   . Parkinson disease ([place]) 01/17/2015    Past Surgical History:  Procedure Laterality Date  . FINGER SURGERY    . MOUTH SURGERY    . TOE SURGERY    . TONSILLECTOMY    . TUBAL LIGATION      There were no vitals filed for this visit.      Subjective Assessment - 11/12/16 1324    Subjective Have not had any falls; have some bad days, but always feel good after therapy.   Patient Stated Goals Pt's goal for therapy is to reduce rigidity, relax body, have more strength and stamina.   Currently in Pain? No/denies                         [facility] Adult PT Treatment/Exercise - 11/13/16 0833      High Level Balance   High Level Balance Comments MiniBESTest:  18/28  improved from 11/28     Self-Care   Self-Care Other Self-Care Comments   Other Self-Care Comments  Discussed POC, goals, and fall prevention.  Discussed continuing PT for additional short course to review/update HEP and to transition to appropriate community exercises.  Pt in agreement.     Knee/Hip Exercises:  Aerobic   Nustep SciFit L3.0 x 8 min; cues for keep RPM > 40       Mini-BESTest: Balance Evaluation Systems Test  2005-2013 [employer]. All rights reserved. ________________________________________________________________________________________Anticipatory_________Subscore__4___/6 1. SIT TO STAND Instruction: "Cross your arms across your chest. Try not to use your hands unless you must.Do not let your legs lean against the back of the chair when you stand. Please stand up now." X(2) Normal: Comes to stand without use of hands and stabilizes independently. (1) Moderate: Comes to stand WITH use of hands on first attempt. (0) Severe: Unable to stand up from chair without assistance, OR needs several attempts with use of hands. 2. RISE TO TOES Instruction: "Place your feet shoulder width apart. Place your hands on your hips. Try to rise as high as you can onto your toes. I will count out loud to 3 seconds. Try to hold this pose for at least 3 seconds. Look straight ahead. Rise now." (2) Normal: Stable for 3 s with maximum height. X(1) Moderate: Heels up, but not full range (smaller than when holding hands), OR noticeable instability for 3 s. (0) Severe: < 3 s. 3. STAND ON ONE LEG Instruction: "Look straight ahead. Keep your hands on your hips. Lift your leg off  of the ground behind you without touching or resting your raised leg upon your other standing leg. Stay standing on one leg as long as you can. Look straight ahead. Lift now." Left: Time in Seconds Trial 1:__2.5___Trial 2:__1.6___ (2) Normal: 20 s. X(1) Moderate: < 20 s. (0) Severe: Unable. Right: Time in Seconds Trial 1:__1.5___Trial 2:__2.5___ (2) Normal: 20 s. X(1) Moderate: < 20 s. (0) Severe: Unable To score each side separately use the trial with the longest time. To calculate the sub-score and total score use the side [left or right] with the lowest numerical score [i.e. the worse  side]. ______________________________________________________________________________________Reactive Postural Control___________Subscore:__3___/6 4. COMPENSATORY STEPPING CORRECTION- FORWARD Instruction: "Stand with your feet shoulder width apart, arms at your sides. Lean forward against my hands beyond your forward limits. When I let go, do whatever is necessary, including taking a step, to avoid a fall." (2) Normal: Recovers independently with a single, large step (second realignment step is allowed). (1) Moderate: More than one step used to recover equilibrium. (0) Severe: No step, OR would fall if not caught, OR falls spontaneously. 5. COMPENSATORY STEPPING CORRECTION- BACKWARD Instruction: "Stand with your feet shoulder width apart, arms at your sides. Lean backward against my hands beyond your backward limits. When I let go, do whatever is necessary, including taking a step, to avoid a fall." (2) Normal: Recovers independently with a single, large step. X(1) Moderate: More than one step used to recover equilibrium. (0) Severe: No step, OR would fall if not caught, OR falls spontaneously. 6. COMPENSATORY STEPPING CORRECTION- LATERAL Instruction: "Stand with your feet together, arms down at your sides. Lean into my hand beyond your sideways limit. When I let go, do whatever is necessary, including taking a step, to avoid a fall." Left (2) Normal: Recovers independently with 1 step (crossover or lateral OK). X(1) Moderate: Several steps to recover equilibrium. (0) Severe: Falls, or cannot step. Right (2) Normal: Recovers independently with 1 step (crossover or lateral OK). X(1) Moderate: Several steps to recover equilibrium. (0) Severe: Falls, or cannot step. Use the side with the lowest score to calculate sub-score and total score. ____________________________________________________________________________________Sensory Orientation_____________Subscore:__4_______/6 7. STANCE (FEET  TOGETHER); EYES OPEN, FIRM SURFACE Instruction: "Place your hands on your hips. Place your feet together until almost touching. Look straight ahead. Be as stable and still as possible, until I say stop." Time in seconds:________ X(2) Normal: 30 s. (1) Moderate: < 30 s. (0) Severe: Unable. 8. STANCE (FEET TOGETHER); EYES CLOSED, FOAM SURFACE Instruction: "Step onto the foam. Place your hands on your hips. Place your feet together until almost touching. Be as stable and still as possible, until I say stop. I will start timing when you close your eyes." Time in seconds:________ (2) Normal: 30 s. (1) Moderate: < 30 s. X(0) Severe: Unable. 9. INCLINE- EYES CLOSED Instruction: "Step onto the incline ramp. Please stand on the incline ramp with your toes toward the top. Place your feet shoulder width apart and have your arms down at your sides. I will start timing when you close your eyes." Time in seconds:________ X(2) Normal: Stands independently 30 s and aligns with gravity. (1) Moderate: Stands independently <30 s OR aligns with surface. (0) Severe: Unable. _________________________________________________________________________________________Dynamic Gait ______Subscore____7____/10 10. CHANGE IN GAIT SPEED Instruction: "Begin walking at your normal speed, when I tell you 'fast', walk as fast as you can. When I say 'slow', walk very slowly." X(2) Normal: Significantly changes walking speed without imbalance. (1) Moderate: Unable to change walking speed or signs  of imbalance. (0) Severe: Unable to achieve significant change in walking speed AND signs of imbalance. [place] - HORIZONTAL Instruction: "Begin walking at your normal speed, when I say "right", turn your head and look to the right. When I say "left" turn your head and look to the left. Try to keep yourself walking in a straight line." (2) Normal: performs head turns with no change in gait speed and good  balance. X(1) Moderate: performs head turns with reduction in gait speed. (0) Severe: performs head turns with imbalance. 12. WALK WITH PIVOT TURNS Instruction: "Begin walking at your normal speed. When I tell you to 'turn and stop', turn as quickly as you can, face the opposite direction, and stop. After the turn, your feet should be close together." X(2) Normal: Turns with feet close FAST (< 3 steps) with good balance. (1) Moderate: Turns with feet close SLOW (>4 steps) with good balance. (0) Severe: Cannot turn with feet close at any speed without imbalance. 13. STEP OVER OBSTACLES Instruction: "Begin walking at your normal speed. When you get to the box, step over it, not around it and keep walking." (2) Normal: Able to step over box with minimal change of gait speed and with good balance. X(1) Moderate: Steps over box but touches box OR displays cautious behavior by slowing gait. (0) Severe: Unable to step over box OR steps around box. 14. TIMED UP & GO WITH DUAL TASK [3 METER WALK] Instruction TUG: "When I say 'Go', stand up from chair, walk at your normal speed across the tape on the floor, turn around, and come back to sit in the chair." Instruction TUG with Dual Task: "Count backwards by threes starting at ___. When I say 'Go', stand up from chair, walk at your normal speed across the tape on the floor, turn around, and come back to sit in the chair. Continue counting backwards the entire time." TUG: ____11.24____seconds; Dual Task TUG: ____12.46____seconds (2) Normal: No noticeable change in sitting, standing or walking while backward counting when compared to TUG without Dual Task. X(1) Moderate: Dual Task affects either counting OR walking (>10%) when compared to the TUG without Dual Task. (0) Severe: Stops counting while walking OR stops walking while counting. When scoring item 14, if subject's gait speed slows more than 10% between the TUG without and with a Dual Task the  score should be decreased by a point. TOTAL SCORE: ___18_____/28         PT Education - 11/13/16 0836    Education provided Yes   Education Details Fall prevention, POC/goal update, plans to renew/continue PT    Person(s) Educated Patient   Methods Explanation;Handout   Comprehension Verbalized understanding          PT Short Term Goals - 10/17/16 1417      PT SHORT TERM GOAL #1   Title Pt will perform HEP with family supervision, for improved balance, transfers, posture, mobility.  TARGET 10/16/16   Status Achieved     PT SHORT TERM GOAL #2   Title Pt will perform at least 6 of 10 reps of sit<>stand transfers with minimal to no UE support, for improved functional strength and transfer efficiency.   Status Achieved     PT SHORT TERM GOAL #3   Title Pt will improve TUG score to less than or equal to 15 seconds for decreased fall risk.   Status Achieved     PT SHORT TERM GOAL #4   Title  Pt will ambulate at least 300 ft with least restrictive assistive device modified independently, for improved household and short distance community gait.   Status Achieved     PT SHORT TERM GOAL #5   Title Pt will verbalize understanding of local Parkinson's disease-related resources.   Baseline handouts issued 10/15/16   Status Achieved           PT Long Term Goals - 11/13/16 0837      PT LONG TERM GOAL #1   Title Pt will verbalize understanding of fall prevention in the home environment.  TARGET 11/16/16   Status Achieved     PT LONG TERM GOAL #2   Title Pt will improve TUG score to less than or equal to 13.5 seconds for decreased fall risk.   Status Achieved     PT LONG TERM GOAL #3   Title Pt will improve TUG cognitive to less than or equal to 15 seconds for decreased fall risk/improved dual tasking with gait.   Status Achieved     PT LONG TERM GOAL #4   Title Pt will improve MiniBESTest score to at least 16/28 for decreased fall risk.   Baseline 18/28 score on 11/12/16    Status Achieved     PT LONG TERM GOAL #5   Title Pt will improve gait velocity to at least 2 ft/sec for improved gait efficiency and safety.   Status Achieved     Additional Long Term Goals   Additional Long Term Goals Yes     PT LONG TERM GOAL #6   Title Pt will perform updated/progressive HEP with family's supervision.  TARGET 12/13/16   Time 4   Period Weeks   Status New     PT LONG TERM GOAL #7   Title Pt will verbalize plans for continued community fitness upon D/C from PT.  TARGET 12/13/16   Time 4   Period Weeks   Status New               Plan - 11/13/16 0839    Clinical Impression Statement Pt missed first session this week due to not feeling well.  Checked remaining goals today, with pt meeting all LTGs.  Pt continues to report uncertainty about HEP and transition to community fitness.  Plan to renew with updated goals to address this and work towards family education in HEP and discussion regarding transition to appropriate community fitness options.   PT Duration 4 weeks  per recert [id number]   PT Treatment/Interventions Functional mobility training;Gait training;Therapeutic activities;Therapeutic exercise;Neuromuscular re-education;Balance training;Patient/family education;DME Instruction;ADLs/Self Care Home Management   PT Next Visit Plan Review pt's current HEP and make updates as needed (family education for reinforcement if needed); discuss community fitness options; provide with exercise chart   Consulted and Agree with Plan of Care Patient      Patient will benefit from skilled therapeutic intervention in order to improve the following deficits and impairments:  Abnormal gait, Decreased balance, Decreased mobility, Decreased safety awareness, Difficulty walking, Postural dysfunction, Impaired tone, Decreased endurance, Decreased strength  Visit Diagnosis: Other abnormalities of gait and mobility  Other symptoms and signs involving the nervous  system  Unsteadiness on feet  Abnormal posture     Problem List Patient Active Problem List   Diagnosis Date Noted  . Muscle weakness of lower extremity 01/17/2015  . Parkinson disease ([place]) 01/17/2015  . BV (bacterial vaginosis) 02/18/2013  . Fibroid uterus 11/13/2012  . Benign colonic polyp 10/02/2012    [name] [name]  W. 11/13/2016, 8:43 AM Frazier Butt., PT [facility] [address] [place], [place], [address] Phone: [phone]   Fax:  [phone]  Name: [name] [name] MRN: [id number] Date of Birth: 1951/01/31

## 2016-11-19 ENCOUNTER — Ambulatory Visit: Payer: Medicare Other | Admitting: Occupational Therapy

## 2016-11-19 ENCOUNTER — Ambulatory Visit: Payer: Medicare Other | Admitting: Physical Therapy

## 2016-11-19 DIAGNOSIS — R29818 Other symptoms and signs involving the nervous system: Secondary | ICD-10-CM

## 2016-11-19 DIAGNOSIS — R2689 Other abnormalities of gait and mobility: Secondary | ICD-10-CM

## 2016-11-19 DIAGNOSIS — R278 Other lack of coordination: Secondary | ICD-10-CM

## 2016-11-19 DIAGNOSIS — R2681 Unsteadiness on feet: Secondary | ICD-10-CM | POA: Diagnosis not present

## 2016-11-19 DIAGNOSIS — R293 Abnormal posture: Secondary | ICD-10-CM

## 2016-11-19 DIAGNOSIS — R29898 Other symptoms and signs involving the musculoskeletal system: Secondary | ICD-10-CM | POA: Diagnosis not present

## 2016-11-19 NOTE — Therapy (Signed)
[facility] [address] [place], [place], [address] Phone: [phone]   Fax:  [phone]  Occupational Therapy Treatment  Patient Details  Name: [name] [name] MRN: [id number] Date of Birth: 1950/10/30 Referring Provider: Dr. Jannifer Franklin  Encounter Date: 11/19/2016      OT End of Session - 11/19/16 1552    Visit Number 4   Number of Visits 17   Date for OT Re-Evaluation 12/26/16   Authorization Type Medicare   Authorization - Visit Number 4   Authorization - Number of Visits 10   OT Start Time [id number]   OT Stop Time 1622   OT Time Calculation (min) 47 min   Activity Tolerance Patient tolerated treatment well   Behavior During Therapy [facility] for tasks assessed/performed      Past Medical History:  Diagnosis Date  . Fibroid   . GERD (gastroesophageal reflux disease)   . Muscle weakness of lower extremity 01/17/2015  . Osteopenia   . Parkinson disease ([place]) 01/17/2015    Past Surgical History:  Procedure Laterality Date  . FINGER SURGERY    . MOUTH SURGERY    . TOE SURGERY    . TONSILLECTOMY    . TUBAL LIGATION      There were no vitals filed for this visit.      Subjective Assessment - 11/19/16 1536    Subjective  denies pain   "not unless I over exert."  Pt reports incr ease with donning jacket with strategies after instruction.  "This is really good"   Pertinent History see epic   Patient Stated Goals improve independence with ADLS/ IADLs   Currently in Pain? No/denies      Neuro re-ed:  PWR! Moves (basic 4) in quadraped x 15-20 each with min-mod cues For incr movement amplitude (keeping knees apart, deliberate stop/start to movement, finger/elbow extension, head/eye movements).  Adapted PWR! Step (no PWR! Up).  Pt reports improved performance from last session.  Pt improved with repetition.  2 rest breaks needed due to fatigue with incr effort.   Self Care:    Began instruction in importance and   in use of large amplitude movements to prevent future complications related to PD.  Educated pt on how PWR! Moves relates to ADLs.  Discussed keeping feet apart in sitting, standing, quadraped for improved balance and incr trunk/UE movement to decr bradykinesia/rigidity.  Pt also with questions regarding fingers "catching" with flex/ext (on L hand).  Educated pt in emphasizing finger extension (particularly at MPs to decr PIP hyperextension for improved tendon gliding).  Pt verbalized understanding.  Pt instructed in causes for weight loss with PD (due to pt questions and concerns).  Also discussed constipation causes with PD and ways to help (exercise, incr fiber, incr fruits/vegetables, incr water intake).  Pt verbalized understanding.  Pt asked about strategies for donning seatbelt.  Pt instructed in strategy (demo and explanation)  Pt verbalized understanding.  Dressing:    Practiced donning/doffing jacket using large amplitude movement strategies after initial instruction (strategies for holding jacket, don like cape, pushing arms).  Pt demo improvement with repetition and use/min cues for large amplitude movements.  Simulated donning/doffing pants with bag with focus/min-mod cues for large amplitude movement strategies including forward lean, big step, reaching out with arms, and pulling up in 1 big movement.  Pt improved with repetition.  Functional mobility:   Ambulating with large amplitude movements/arm swing within session.   Floor transfer using PWR!/large amplitude movement techniques with  min cues, Performed independently with UE support on mat.                       OT Short Term Goals - 10/31/16 [id number]      OT SHORT TERM GOAL #1   Title I with PD specific HEP.   Time 4   Period Weeks   Status New     OT SHORT TERM GOAL #2   Title Pt will verbalize understanding of adapted strategies for ADLs/ IADLS in order to maximize safety and independence   Time 4    Period Weeks   Status New     OT SHORT TERM GOAL #3   Title Pt will demonstrate improved independnece with ADLS as evidenced by decreasing 3 button/ unbutton to 38 secs or less   Baseline 41 secs   Time 4   Period Weeks   Status New     OT SHORT TERM GOAL #4   Title Pt will demonstrate ability to retrieve a lightweight object with RUE at 130 shoulder flexion and -10 elbow ext with pain less than or equal to 2/10   Time 4   Period Weeks   Status New           OT Long Term Goals - 10/31/16 0900      OT LONG TERM GOAL #1   Title Pt will verbalize understanding of PD related community resources   Time 8   Period Weeks   Status New     OT LONG TERM GOAL #2   Title Pt will improve functional reaching  and decrease fall risk as evidenced by increasing bilateral standing functional reach to 10 inches or greater bilaterally.   Baseline RUE 7.5 inches, RUE 9.5 inches   Time 8   Period Weeks   Status New     OT LONG TERM GOAL #3   Title Pt will verbalize understanding of ways to prevent future PD related complications, including ways to reduce shoulder pain and tightness   Time 8   Period Weeks   Status New     OT LONG TERM GOAL #4   Title Pt will verbalize understanding of ways to keep thinking skills sharp.   Time 4   Period Weeks   Status New               Plan - 11/19/16 1633    Clinical Impression Statement Pt responds well to cueing for large amplitude movement strategies for functional tasks (dressing).  Pt verbalized understanding of importance of use of large amplitude movements for incr ease with ADLs.   Rehab Potential Good   OT Frequency 2x / week   OT Duration 8 weeks   OT Treatment/Interventions Self-care/ADL training;Moist Heat;Fluidtherapy;DME and/or AE instruction;Splinting;Patient/family education;Balance training;Therapeutic exercises;Gait Training;Contrast Bath;Traction;Therapeutic activities;Ultrasound;Therapeutic exercise;Cognitive  remediation/compensation;Passive range of motion;Functional Mobility Training;Neuromuscular education;Electrical Stimulation;Parrafin;Energy conservation;Manual Therapy   Plan continue with large amplitude movement strategies for ADLs (issue handout) and practice in functional context   OT Home Exercise Plan Education provided:  seated PWR (by PT), quadraped PWR, coordination HEP   Consulted and Agree with Plan of Care Patient      Patient will benefit from skilled therapeutic intervention in order to improve the following deficits and impairments:  Abnormal gait, Decreased coordination, Decreased range of motion, Difficulty walking, Impaired flexibility, Decreased safety awareness, Decreased endurance, Decreased activity tolerance, Decreased knowledge of precautions, Impaired tone, Decreased balance, Decreased knowledge of use of DME, Impaired UE  functional use, Pain, Impaired vision/preception, Impaired perceived functional ability, Decreased strength, Decreased mobility, Decreased cognition  Visit Diagnosis: Other symptoms and signs involving the nervous system  Other symptoms and signs involving the musculoskeletal system  Other lack of coordination  Abnormal posture  Unsteadiness on feet  Other abnormalities of gait and mobility    Problem List Patient Active Problem List   Diagnosis Date Noted  . Muscle weakness of lower extremity 01/17/2015  . Parkinson disease ([place]) 01/17/2015  . BV (bacterial vaginosis) 02/18/2013  . Fibroid uterus 11/13/2012  . Benign colonic polyp 10/02/2012    [facility] 11/19/2016, 4:49 PM  [facility] [address] [place], [place], [address] Phone: [phone]   Fax:  [phone]  Name: [name] [name] MRN: [id number] Date of Birth: 02/19/1951   Vianne Bulls, OTR/L [facility] [address] [place],   [address] [phone]  phone [phone] 11/19/16 4:49 PM

## 2016-11-19 NOTE — Therapy (Signed)
[facility] [address] [place], [place], [address] Phone: [phone]   Fax:  [phone]  Physical Therapy Treatment  Patient Details  Name: [name] [name] MRN: [id number] Date of Birth: February 12, 1951 Referring Provider: Dr. Jannifer Franklin  Encounter Date: 11/19/2016      PT End of Session - 11/19/16 1440    Visit Number 14   Number of Visits 21   Date for PT Re-Evaluation 01/12/17   Authorization Type Medicare Primary, [employer] 2nd-GCODE every 10th visit   PT Start Time 1400   PT Stop Time 1440   PT Time Calculation (min) 40 min   Activity Tolerance Patient tolerated treatment well   Behavior During Therapy [facility] for tasks assessed/performed      Past Medical History:  Diagnosis Date  . Fibroid   . GERD (gastroesophageal reflux disease)   . Muscle weakness of lower extremity 01/17/2015  . Osteopenia   . Parkinson disease ([place]) 01/17/2015    Past Surgical History:  Procedure Laterality Date  . FINGER SURGERY    . MOUTH SURGERY    . TOE SURGERY    . TONSILLECTOMY    . TUBAL LIGATION      There were no vitals filed for this visit.      Subjective Assessment - 11/19/16 1406    Subjective wants to do her stretches today.   Patient Stated Goals Pt's goal for therapy is to reduce rigidity, relax body, have more strength and stamina.   Currently in Pain? No/denies                         [facility] Adult PT Treatment/Exercise - 11/19/16 1407      Ambulation/Gait   Ramp 5: Supervision   Curb 5: Supervision     High Level Balance   High Level Balance Activities Turns   High Level Balance Comments amb 15-20' with 180 deg turns with supervision, pt demonstrated proper technique     Knee/Hip Exercises: Aerobic   Nustep SciFit L3.0 x 8 min; cues for keep RPM > 40     Knee/Hip Exercises: Standing   Forward Step Up Both;10 reps;Hand Hold: 0   SLS taps to 6", 12" step x 10 bil without UE support            PWR [facility]) - 11/19/16 1414    PWR! exercises Moves in sitting;Moves in standing   PWR! Up x20   PWR! Rock x10 bil   PWR! Twist x10 bil   PWR Step x10 bil   PWR! Up x20   PWR! Rock x10 bil   PWR! Twist x10 bil   PWR! Step x10 bil   Comments sitting               PT Short Term Goals - 10/17/16 1417      PT SHORT TERM GOAL #1   Title Pt will perform HEP with family supervision, for improved balance, transfers, posture, mobility.  TARGET 10/16/16   Status Achieved     PT SHORT TERM GOAL #2   Title Pt will perform at least 6 of 10 reps of sit<>stand transfers with minimal to no UE support, for improved functional strength and transfer efficiency.   Status Achieved     PT SHORT TERM GOAL #3   Title Pt will improve TUG score to less than or equal to 15 seconds for decreased fall risk.   Status Achieved  PT SHORT TERM GOAL #4   Title Pt will ambulate at least 300 ft with least restrictive assistive device modified independently, for improved household and short distance community gait.   Status Achieved     PT SHORT TERM GOAL #5   Title Pt will verbalize understanding of local Parkinson's disease-related resources.   Baseline handouts issued 10/15/16   Status Achieved           PT Long Term Goals - 11/13/16 0837      PT LONG TERM GOAL #1   Title Pt will verbalize understanding of fall prevention in the home environment.  TARGET 11/16/16   Status Achieved     PT LONG TERM GOAL #2   Title Pt will improve TUG score to less than or equal to 13.5 seconds for decreased fall risk.   Status Achieved     PT LONG TERM GOAL #3   Title Pt will improve TUG cognitive to less than or equal to 15 seconds for decreased fall risk/improved dual tasking with gait.   Status Achieved     PT LONG TERM GOAL #4   Title Pt will improve MiniBESTest score to at least 16/28 for decreased fall risk.   Baseline 18/28 score on 11/12/16   Status Achieved     PT LONG TERM  GOAL #5   Title Pt will improve gait velocity to at least 2 ft/sec for improved gait efficiency and safety.   Status Achieved     Additional Long Term Goals   Additional Long Term Goals Yes     PT LONG TERM GOAL #6   Title Pt will perform updated/progressive HEP with family's supervision.  TARGET 12/13/16   Time 4   Period Weeks   Status New     PT LONG TERM GOAL #7   Title Pt will verbalize plans for continued community fitness upon D/C from PT.  TARGET 12/13/16   Time 4   Period Weeks   Status New               Plan - 11/19/16 1441    Clinical Impression Statement Pt demonstrated improved turns today without LOB or feet getting twisted.  Pt continues to need cues with PWR! Twist exercise, but other exercises are appropriate.  Will continue to benefit from PT to maximize function and transition to home and commnity program.   PT Treatment/Interventions Functional mobility training;Gait training;Therapeutic activities;Therapeutic exercise;Neuromuscular re-education;Balance training;Patient/family education;DME Instruction;ADLs/Self Care Home Management   PT Next Visit Plan  discuss community fitness options; provide with exercise chart   Consulted and Agree with Plan of Care Patient      Patient will benefit from skilled therapeutic intervention in order to improve the following deficits and impairments:  Abnormal gait, Decreased balance, Decreased mobility, Decreased safety awareness, Difficulty walking, Postural dysfunction, Impaired tone, Decreased endurance, Decreased strength  Visit Diagnosis: Other symptoms and signs involving the nervous system  Other abnormalities of gait and mobility  Unsteadiness on feet     Problem List Patient Active Problem List   Diagnosis Date Noted  . Muscle weakness of lower extremity 01/17/2015  . Parkinson disease ([place]) 01/17/2015  . BV (bacterial vaginosis) 02/18/2013  . Fibroid uterus 11/13/2012  . Benign colonic polyp  10/02/2012       Laureen Abrahams, PT, DPT 11/19/16 2:43 PM   [facility] [address] [place], [place], [address] Phone: [phone]   Fax:  [phone]  Name: [name] [name] MRN: [id number] Date of Birth: Jan 20, 1951

## 2016-11-21 ENCOUNTER — Ambulatory Visit: Payer: Medicare Other | Admitting: Physical Therapy

## 2016-11-21 ENCOUNTER — Ambulatory Visit: Payer: Medicare Other | Admitting: Occupational Therapy

## 2016-11-21 DIAGNOSIS — R29818 Other symptoms and signs involving the nervous system: Secondary | ICD-10-CM | POA: Diagnosis not present

## 2016-11-21 DIAGNOSIS — R293 Abnormal posture: Secondary | ICD-10-CM | POA: Diagnosis not present

## 2016-11-21 DIAGNOSIS — R2681 Unsteadiness on feet: Secondary | ICD-10-CM | POA: Diagnosis not present

## 2016-11-21 DIAGNOSIS — R2689 Other abnormalities of gait and mobility: Secondary | ICD-10-CM | POA: Diagnosis not present

## 2016-11-21 DIAGNOSIS — R29898 Other symptoms and signs involving the musculoskeletal system: Secondary | ICD-10-CM | POA: Diagnosis not present

## 2016-11-21 DIAGNOSIS — R278 Other lack of coordination: Secondary | ICD-10-CM

## 2016-11-21 NOTE — Patient Instructions (Signed)
Performing Daily Activities with Big Movements  Pick at least 2 activities a day and perform with BIG, DELIBERATE movements/effort.  Try different activities each day. This can make the activity easier and turn daily activities into exercise to prevent problems in the future!  If you are standing during the activity, make sure to keep feet apart and stand with good/big/PWR! UP posture.  Examples:  Dressing - Push arms in sleeves, twist when putting on jacket, push foot into pants, open hands to pull down shirt/put on socks/pull up pants  Buttoning - Open hands big (PWR! Hands) before fastening each button  Bathing - Wash/dry with long strokes  Brushing your teeth - Big, slow movements  Cutting food - Long deliberate cuts  Eating - Hold utensil in the middle, not the end  Picking up a cup/bottle - Open hand up big and get object all the way in palm  Opening jar/bottle - Move as much as you can with each turn  Putting on seatbelt - Twist when reaching  Hanging up clothes/getting clothes down from closet - Reach with big effort  Putting away groceries/dishes - Reach with big effort  Wiping counter/table - Move in big, long strokes  Stirring while cooking - Exaggerate movement  Cleaning windows - Move in big, long strokes  Sweeping - Move arms in big, long strokes  Vacuuming - Push with big movement  Folding clothes - Exaggerate arm movements  Washing car - Move in big, long strokes  Raking - Move arms in big, long strokes  Changing light bulb - Move as much as you can with each turn  Using a screwdriver - Move as much as you can with each turn  Walking into a store/restaurant - Walk with big steps, swing arms if able  Standing up from a chair/recliner/sofa - Scoot forward, lean forward, and stand with big effort  Getting out of bed - Step feet to side of bed, scoot hips, big twist/roll, and sit up

## 2016-11-21 NOTE — Therapy (Signed)
[facility] [address] [place], [place], [address] Phone: [phone]   Fax:  [phone]  Occupational Therapy Treatment  Patient Details  Name: [name] [name] MRN: [id number] Date of Birth: [birth year] Referring Provider: Dr. Jannifer Franklin  Encounter Date: 11/21/2016      OT End of Session - 11/21/16 1702    Visit Number 5   Number of Visits 17   Date for OT Re-Evaluation 12/26/16   Authorization Type Medicare   Authorization - Visit Number 5   Authorization - Number of Visits 10   OT Start Time [id number]   OT Stop Time 1600   OT Time Calculation (min) 44 min   Activity Tolerance Patient tolerated treatment well   Behavior During Therapy [facility] for tasks assessed/performed      Past Medical History:  Diagnosis Date  . Fibroid   . GERD (gastroesophageal reflux disease)   . Muscle weakness of lower extremity 01/17/2015  . Osteopenia   . Parkinson disease ([place]) 01/17/2015    Past Surgical History:  Procedure Laterality Date  . FINGER SURGERY    . MOUTH SURGERY    . TOE SURGERY    . TONSILLECTOMY    . TUBAL LIGATION      There were no vitals filed for this visit.      Subjective Assessment - 11/21/16 1721    Subjective  "this is great.  I'll work on this"  (ADL strategies)   Pertinent History see epic   Patient Stated Goals improve independence with ADLS/ IADLs   Currently in Pain? Yes   Pain Score 5    Pain Location --  legs/arms   Pain Orientation Right;Left   Pain Descriptors / Indicators Sore   Pain Type Acute pain   Pain Onset In the past 7 days   Pain Frequency Constant   Aggravating Factors  with movement (soreness from exercise)   Pain Relieving Factors rest       Neuro re-ed:  PWR! Moves (basic 4) in supine x 10-20 each with min cues For incr movement amplitude, deliberate movement, lifting hips for step, opening hands.    Self Care:    Educated pt in importance and in use of large  amplitude movements to prevent future complications related to PD.  Pt verbalized understanding.--Reviewed handout (see pt instructions)   Eating:  Instructed pt in strategies for eating including holding utensil in the middle vs. The end, use of big intentional movements, and good posture.  Pt verbalized understanding and returned demo after practice.  Practiced grasp/release of cylinder objects (bottles, cups) with use of PWR! Hands/large amplitude movements with min cueing.  Opening/closing bottles with use of large amplitude movement strategies with min cueing.   Dressing:   Practiced buttoning/unbuttoning shirt min cues for use of PWR! Hands prior to buttoning and use of deliberate/large amplitude movements after instruction.  Pt demo improvement with repetition and use of large amplitude movements.  Functional mobility:   Sit>stand with min cues for large amplitude movement technique (including forward reach).  Supine>sitting with min cues for use of large amplitude movement strategy to incr ease and decr stress on back to prevent pain.  Pt verbalized understanding and returned demo with cueing.  Emphasized importance of large base of support/feet apart, trunk rotation, and wt. Shift with ADLs/IADLs.   Writing:  Instructed pt in use of PWR! Hands with writing, and focus on letter size/formation, but did not practice today due to time contstraints.  OT Education - 11/21/16 1710    Education Details Large amplitude movement strategies for ADLs to incr ease and prevent future complications   Person(s) Educated Patient   Methods Explanation;Demonstration;Verbal cues;Handout   Comprehension Returned demonstration;Verbalized understanding;Verbal cues required  min cues           OT Short Term Goals - 10/31/16 0859      OT SHORT TERM GOAL #1   Title I with PD specific HEP.   Time 4   Period Weeks   Status New     OT SHORT TERM GOAL #2    Title Pt will verbalize understanding of adapted strategies for ADLs/ IADLS in order to maximize safety and independence   Time 4   Period Weeks   Status New     OT SHORT TERM GOAL #3   Title Pt will demonstrate improved independnece with ADLS as evidenced by decreasing 3 button/ unbutton to 38 secs or less   Baseline 41 secs   Time 4   Period Weeks   Status New     OT SHORT TERM GOAL #4   Title Pt will demonstrate ability to retrieve a lightweight object with RUE at 130 shoulder flexion and -10 elbow ext with pain less than or equal to 2/10   Time 4   Period Weeks   Status New           OT Long Term Goals - 10/31/16 0900      OT LONG TERM GOAL #1   Title Pt will verbalize understanding of PD related community resources   Time 8   Period Weeks   Status New     OT LONG TERM GOAL #2   Title Pt will improve functional reaching  and decrease fall risk as evidenced by increasing bilateral standing functional reach to 10 inches or greater bilaterally.   Baseline RUE 7.5 inches, RUE 9.5 inches   Time 8   Period Weeks   Status New     OT LONG TERM GOAL #3   Title Pt will verbalize understanding of ways to prevent future PD related complications, including ways to reduce shoulder pain and tightness   Time 8   Period Weeks   Status New     OT LONG TERM GOAL #4   Title Pt will verbalize understanding of ways to keep thinking skills sharp.   Time 4   Period Weeks   Status New               Plan - 11/21/16 1706    Clinical Impression Statement Pt demo increased ease with ADLs using large amplitude movement strategies and was able to return demo after instruction.   Rehab Potential Good   OT Frequency 2x / week   OT Duration 8 weeks   OT Treatment/Interventions Self-care/ADL training;Moist Heat;Fluidtherapy;DME and/or AE instruction;Splinting;Patient/family education;Balance training;Therapeutic exercises;Gait Training;Contrast Bath;Traction;Therapeutic  activities;Ultrasound;Therapeutic exercise;Cognitive remediation/compensation;Passive range of motion;Functional Mobility Training;Neuromuscular education;Electrical Stimulation;Parrafin;Energy conservation;Manual Therapy   Plan address handwriting; continue with large amplitude movement strategies and PWR! moves; arm bike   OT Home Exercise Plan Education provided:  seated PWR (by PT), quadraped PWR, coordination HEP; ADL strategies   Consulted and Agree with Plan of Care Patient      Patient will benefit from skilled therapeutic intervention in order to improve the following deficits and impairments:  Abnormal gait, Decreased coordination, Decreased range of motion, Difficulty walking, Impaired flexibility, Decreased safety awareness, Decreased endurance, Decreased activity tolerance, Decreased knowledge of precautions, Impaired  tone, Decreased balance, Decreased knowledge of use of DME, Impaired UE functional use, Pain, Impaired vision/preception, Impaired perceived functional ability, Decreased strength, Decreased mobility, Decreased cognition  Visit Diagnosis: Other symptoms and signs involving the nervous system  Other abnormalities of gait and mobility  Other symptoms and signs involving the musculoskeletal system  Unsteadiness on feet  Other lack of coordination  Abnormal posture    Problem List Patient Active Problem List   Diagnosis Date Noted  . Muscle weakness of lower extremity 01/17/2015  . Parkinson disease ([place]) 01/17/2015  . BV (bacterial vaginosis) 02/18/2013  . Fibroid uterus 11/13/2012  . Benign colonic polyp 10/02/2012    [facility] 11/21/2016, 6:45 PM  [facility] [address] [place], [place], [address] Phone: [phone]   Fax:  [phone]  Name: [name] [name] MRN: [id number] Date of Birth: Dec 12, 1950   Vianne Bulls, OTR/L [facility] [address]  [place], [place]  [address] [phone] phone [phone] 11/21/16 6:45 PM

## 2016-11-21 NOTE — Patient Instructions (Addendum)
(  Exercise) Monday Tuesday Wednesday Thursday Friday Saturday Sunday                                                                                                        Optimal Fitness Program after Therapy for People with Parkinson's Disease  1)  Therapy Home Exercise Program  -Do these Exercises DAILY as instructed by your therapist  -Big, deliberate effort with exercises  -These exercises are important to perform consistently, even when therapist has  finished, because these therapy exercises often address your specific  Parkinson's difficulties   2)  Walking  -  Work up to walking 3-5 times per week, 20-30 minutes per day  -This can be done at home, driveway, quiet street or an indoor track  -Focus should be on your Best posture, arm swing, step length for your best  walking pattern  3)  Aerobic Exercise  -Work up to 3-5 times per week, 30 minutes per day  -This can be stationary bike, seated stepper machine, elliptical machine  -Work up to 7-8/10 intensity during the exercise, at minimal to moderate     Resistance

## 2016-11-22 NOTE — Therapy (Signed)
[facility] [address] [place], [place], [address] Phone: [phone]   Fax:  [phone]  Physical Therapy Treatment  Patient Details  Name: [name] [name] MRN: [id number] Date of Birth: 02/15/1951 Referring Provider: Dr. Jannifer Franklin  Encounter Date: 11/21/2016      PT End of Session - 11/22/16 1616    Visit Number 15   Number of Visits 21   Date for PT Re-Evaluation 01/12/17   Authorization Type Medicare Primary, [employer] 2nd-GCODE every 10th visit   PT Start Time 1216   PT Stop Time 1300   PT Time Calculation (min) 44 min   Activity Tolerance Patient tolerated treatment well   Behavior During Therapy [facility] for tasks assessed/performed      Past Medical History:  Diagnosis Date  . Fibroid   . GERD (gastroesophageal reflux disease)   . Muscle weakness of lower extremity 01/17/2015  . Osteopenia   . Parkinson disease ([place]) 01/17/2015    Past Surgical History:  Procedure Laterality Date  . FINGER SURGERY    . MOUTH SURGERY    . TOE SURGERY    . TONSILLECTOMY    . TUBAL LIGATION      There were no vitals filed for this visit.      Subjective Assessment - 11/21/16 1219    Subjective No changes since last visit.   Patient Stated Goals Pt's goal for therapy is to reduce rigidity, relax body, have more strength and stamina.   Currently in Pain? No/denies                         [facility] Adult PT Treatment/Exercise - 11/22/16 1610      Ambulation/Gait   Ambulation/Gait Yes   Ambulation/Gait Assistance 7: Independent   Ambulation/Gait Assistance Details Cues for increased step length, heelstrike, cues for reciprocal arm swing, initially with gait.   Ambulation Distance (Feet) 600 Feet  400   Assistive device None   Gait Pattern Step-through pattern   Ambulation Surface Level;Indoor   Pre-Gait Activities Stagger stance ant/post weightshifting, 10-20 reps with verbal/tactil cues for  weightshift and to add in arm swing.  At counter, then progression to in gym-forward/back walking with emphasis on weightshfiting and on increased L step length in posterior direction.  Forward/back step and weigthshift (no stop in the middle) for improved balance with weightshifting.     Self-Care   Self-Care Other Self-Care Comments   Other Self-Care Comments  Discussed intensity of exercise program, components of optimal exercise program post D/C from PT, exercise chart for organizing HEP     Knee/Hip Exercises: Aerobic   Nustep SciFit L3.0 x 8 min; cues for keep RPM > 40   Other Aerobic On Sci Fit, cues for 30 seconds bouts of intensity                PT Education - 11/22/16 1615    Education provided Yes   Education Details Spent good deal of time discussing intensity of exercise activities, implementing HEP, walking, and aerobic activities into exercise routine.   Person(s) Educated Patient   Methods Explanation;Demonstration;Handout   Comprehension Verbalized understanding;Returned demonstration;Verbal cues required          PT Short Term Goals - 10/17/16 1417      PT SHORT TERM GOAL #1   Title Pt will perform HEP with family supervision, for improved balance, transfers, posture, mobility.  TARGET 10/16/16   Status Achieved  PT SHORT TERM GOAL #2   Title Pt will perform at least 6 of 10 reps of sit<>stand transfers with minimal to no UE support, for improved functional strength and transfer efficiency.   Status Achieved     PT SHORT TERM GOAL #3   Title Pt will improve TUG score to less than or equal to 15 seconds for decreased fall risk.   Status Achieved     PT SHORT TERM GOAL #4   Title Pt will ambulate at least 300 ft with least restrictive assistive device modified independently, for improved household and short distance community gait.   Status Achieved     PT SHORT TERM GOAL #5   Title Pt will verbalize understanding of local Parkinson's  disease-related resources.   Baseline handouts issued 10/15/16   Status Achieved           PT Long Term Goals - 11/13/16 0837      PT LONG TERM GOAL #1   Title Pt will verbalize understanding of fall prevention in the home environment.  TARGET 11/16/16   Status Achieved     PT LONG TERM GOAL #2   Title Pt will improve TUG score to less than or equal to 13.5 seconds for decreased fall risk.   Status Achieved     PT LONG TERM GOAL #3   Title Pt will improve TUG cognitive to less than or equal to 15 seconds for decreased fall risk/improved dual tasking with gait.   Status Achieved     PT LONG TERM GOAL #4   Title Pt will improve MiniBESTest score to at least 16/28 for decreased fall risk.   Baseline 18/28 score on 11/12/16   Status Achieved     PT LONG TERM GOAL #5   Title Pt will improve gait velocity to at least 2 ft/sec for improved gait efficiency and safety.   Status Achieved     Additional Long Term Goals   Additional Long Term Goals Yes     PT LONG TERM GOAL #6   Title Pt will perform updated/progressive HEP with family's supervision.  TARGET 12/13/16   Time 4   Period Weeks   Status New     PT LONG TERM GOAL #7   Title Pt will verbalize plans for continued community fitness upon D/C from PT.  TARGET 12/13/16   Time 4   Period Weeks   Status New               Plan - 11/22/16 1616    Clinical Impression Statement Focused PT session in instruction in continued exercise plan upon D/C from PT.  Pt needs much reinforcement/cueing with aerobic activity to work on increased intensity.  Continue to work on forward/back weightshift and retro gait activities, which may be added to HEP.   PT Treatment/Interventions Functional mobility training;Gait training;Therapeutic activities;Therapeutic exercise;Neuromuscular re-education;Balance training;Patient/family education;DME Instruction;ADLs/Self Care Home Management   PT Next Visit Plan stagger stance forward/back  weigthshfiting; forward/back step and weightshifting (may add to HEP); retro walking, changes of direction   Consulted and Agree with Plan of Care Patient      Patient will benefit from skilled therapeutic intervention in order to improve the following deficits and impairments:  Abnormal gait, Decreased balance, Decreased mobility, Decreased safety awareness, Difficulty walking, Postural dysfunction, Impaired tone, Decreased endurance, Decreased strength  Visit Diagnosis: Other abnormalities of gait and mobility  Unsteadiness on feet     Problem List Patient Active Problem List   Diagnosis  Date Noted  . Muscle weakness of lower extremity 01/17/2015  . Parkinson disease ([place]) 01/17/2015  . BV (bacterial vaginosis) 02/18/2013  . Fibroid uterus 11/13/2012  . Benign colonic polyp 10/02/2012    [facility]ardt W. 11/22/2016, 4:19 PM Frazier Butt., PT [facility] [address] [place], [place], [address] Phone: [phone]   Fax:  [phone]  Name: [name] [name] MRN: [id number] Date of Birth: September 02, 1951

## 2016-11-26 ENCOUNTER — Ambulatory Visit: Payer: Medicare Other | Admitting: Physical Therapy

## 2016-11-26 ENCOUNTER — Ambulatory Visit: Payer: Medicare Other | Admitting: Occupational Therapy

## 2016-11-26 DIAGNOSIS — R29898 Other symptoms and signs involving the musculoskeletal system: Secondary | ICD-10-CM | POA: Diagnosis not present

## 2016-11-26 DIAGNOSIS — R2689 Other abnormalities of gait and mobility: Secondary | ICD-10-CM

## 2016-11-26 DIAGNOSIS — R2681 Unsteadiness on feet: Secondary | ICD-10-CM

## 2016-11-26 DIAGNOSIS — R278 Other lack of coordination: Secondary | ICD-10-CM | POA: Diagnosis not present

## 2016-11-26 DIAGNOSIS — R29818 Other symptoms and signs involving the nervous system: Secondary | ICD-10-CM | POA: Diagnosis not present

## 2016-11-26 DIAGNOSIS — R293 Abnormal posture: Secondary | ICD-10-CM | POA: Diagnosis not present

## 2016-11-26 NOTE — Patient Instructions (Addendum)
Quick Contraction: Gravity Resisted (Quadruped)   Copyright  VHI. All rights reserved.  Quick Contraction: Gravity Resisted (Quadruped)    On hands and knees, quickly squeeze then fully relax pelvic floor. Perform _2__ sets of _15 sec hold_. Rest for _5__ seconds between sets. Do 2-3___ times a day.  HANDS TO ONE SIDE ---- HIPS GO TOWARD OPPOSITE HEEL FOR TRUNK ROTATION STRETCH Copyright  VHI. All rights reserved.

## 2016-11-26 NOTE — Therapy (Signed)
[facility] [address] [place], [place], [address] Phone: [phone]   Fax:  [phone]  Occupational Therapy Treatment  Patient Details  Name: [name] [name] MRN: [id number] Date of Birth: 03-11-1951 Referring Provider: Dr. Jannifer Franklin  Encounter Date: 11/26/2016      OT End of Session - 11/26/16 1406    Visit Number 6   Number of Visits 17   Date for OT Re-Evaluation 12/26/16   Authorization Type Medicare   Authorization - Visit Number 6   Authorization - Number of Visits 10   OT Start Time [id number]   OT Stop Time 1445   OT Time Calculation (min) 41 min   Activity Tolerance Patient tolerated treatment well   Behavior During Therapy [facility] for tasks assessed/performed      Past Medical History:  Diagnosis Date  . Fibroid   . GERD (gastroesophageal reflux disease)   . Muscle weakness of lower extremity 01/17/2015  . Osteopenia   . Parkinson disease ([place]) 01/17/2015    Past Surgical History:  Procedure Laterality Date  . FINGER SURGERY    . MOUTH SURGERY    . TOE SURGERY    . TONSILLECTOMY    . TUBAL LIGATION      There were no vitals filed for this visit.      Subjective Assessment - 11/26/16 1417    Subjective  Deneis pain   Pertinent History see epic   Patient Stated Goals improve independence with ADLS/ IADLs   Currently in Pain? No/denies          treatment; arm bike x 5 mins, pt maintained 29-40 rpm Dynamic step and reach to toss scarves to targets, in prep for IADLs mod v.c. For large amplitude movements.                     OT Education - 11/26/16 1459    Education provided Yes   Education Details Handwriting strategies, ways to keep thinking skills sharp, ways to prevent future PD realted complications   Person(s) Educated Patient   Methods Explanation;Demonstration;Verbal cues;Handout   Comprehension Verbalized understanding;Returned demonstration          OT  Short Term Goals - 10/31/16 0859      OT SHORT TERM GOAL #1   Title I with PD specific HEP.   Time 4   Period Weeks   Status New     OT SHORT TERM GOAL #2   Title Pt will verbalize understanding of adapted strategies for ADLs/ IADLS in order to maximize safety and independence   Time 4   Period Weeks   Status New     OT SHORT TERM GOAL #3   Title Pt will demonstrate improved independnece with ADLS as evidenced by decreasing 3 button/ unbutton to 38 secs or less   Baseline 41 secs   Time 4   Period Weeks   Status New     OT SHORT TERM GOAL #4   Title Pt will demonstrate ability to retrieve a lightweight object with RUE at 130 shoulder flexion and -10 elbow ext with pain less than or equal to 2/10   Time 4   Period Weeks   Status New           OT Long Term Goals - 10/31/16 0900      OT LONG TERM GOAL #1   Title Pt will verbalize understanding of PD related community resources   Time 8  Period Weeks   Status New     OT LONG TERM GOAL #2   Title Pt will improve functional reaching  and decrease fall risk as evidenced by increasing bilateral standing functional reach to 10 inches or greater bilaterally.   Baseline RUE 7.5 inches, RUE 9.5 inches   Time 8   Period Weeks   Status New     OT LONG TERM GOAL #3   Title Pt will verbalize understanding of ways to prevent future PD related complications, including ways to reduce shoulder pain and tightness   Time 8   Period Weeks   Status New     OT LONG TERM GOAL #4   Title Pt will verbalize understanding of ways to keep thinking skills sharp.   Time 4   Period Weeks   Status New               Plan - 11/26/16 1446    Clinical Impression Statement Pt demonstrates excellent progress towards goals. She verbalizes understanding of education.   Rehab Potential Good   OT Frequency 2x / week   OT Duration 8 weeks   OT Treatment/Interventions Self-care/ADL training;Moist Heat;Fluidtherapy;DME and/or AE  instruction;Splinting;Patient/family education;Balance training;Therapeutic exercises;Gait Training;Contrast Bath;Traction;Therapeutic activities;Ultrasound;Therapeutic exercise;Cognitive remediation/compensation;Passive range of motion;Functional Mobility Training;Neuromuscular education;Electrical Stimulation;Parrafin;Energy conservation;Manual Therapy   Plan check short term goals   OT Home Exercise Plan Education provided:  seated PWR (by PT), quadraped PWR, coordination HEP; ADL strategies   Consulted and Agree with Plan of Care Patient      Patient will benefit from skilled therapeutic intervention in order to improve the following deficits and impairments:  Abnormal gait, Decreased coordination, Decreased range of motion, Difficulty walking, Impaired flexibility, Decreased safety awareness, Decreased endurance, Decreased activity tolerance, Decreased knowledge of precautions, Impaired tone, Decreased balance, Decreased knowledge of use of DME, Impaired UE functional use, Pain, Impaired vision/preception, Impaired perceived functional ability, Decreased strength, Decreased mobility, Decreased cognition  Visit Diagnosis: Other symptoms and signs involving the nervous system  Other abnormalities of gait and mobility  Other symptoms and signs involving the musculoskeletal system    Problem List Patient Active Problem List   Diagnosis Date Noted  . Muscle weakness of lower extremity 01/17/2015  . Parkinson disease ([place]) 01/17/2015  . BV (bacterial vaginosis) 02/18/2013  . Fibroid uterus 11/13/2012  . Benign colonic polyp 10/02/2012    [name] [name] 11/26/2016, 3:00 PM  [facility] [address] [place], [place], [address] Phone: [phone]   Fax:  [phone]  Name: [name] [name] MRN: [id number] Date of Birth: May 29, 1951

## 2016-11-26 NOTE — Patient Instructions (Signed)
Keeping Thinking Skills Sharp: 1. Jigsaw puzzles 2. Card/board games 3. Talking on the phone/social events 4. [email or web address] 5. Online games 6. Word serches/crossword puzzles 7.  Logic puzzles 8. Aerobic exercise (stationary bike) 9. Eating balanced diet (fruits & veggies) 10. Drink water 11. Try something new--new recipe, hobby 12. Crafts 13. Do a variety of activities that are challenging 14. Add cognitive activities to walking/exercising (think of animal/food/city with each letter of the alphabet, counting backwards, thinking of as many vegetables as you can, etc.).--Only do this  If safe (no freezing/falls).  

## 2016-11-26 NOTE — Therapy (Signed)
[facility] [address] [place], [place], [address] Phone: [phone]   Fax:  [phone]  Physical Therapy Treatment  Patient Details  Name: [name] [name] MRN: [id number] Date of Birth: December 01, 1950 Referring Provider: Dr. Jannifer Franklin  Encounter Date: 11/26/2016      PT End of Session - 11/26/16 2124    Visit Number 16   Number of Visits 21   Date for PT Re-Evaluation 01/12/17   Authorization Type Medicare Primary, [employer] 2nd-GCODE every 10th visit   PT Start Time 1315   PT Stop Time 1400   PT Time Calculation (min) 45 min   Equipment Utilized During Treatment Gait belt      Past Medical History:  Diagnosis Date  . Fibroid   . GERD (gastroesophageal reflux disease)   . Muscle weakness of lower extremity 01/17/2015  . Osteopenia   . Parkinson disease ([place]) 01/17/2015    Past Surgical History:  Procedure Laterality Date  . FINGER SURGERY    . MOUTH SURGERY    . TOE SURGERY    . TONSILLECTOMY    . TUBAL LIGATION      There were no vitals filed for this visit.      Subjective Assessment - 11/26/16 2113    Subjective Pt states she got a notebook to organize all her exercises   Patient Stated Goals Pt's goal for therapy is to reduce rigidity, relax body, have more strength and stamina.   Currently in Pain? No/denies                         [facility] Adult PT Treatment/Exercise - 11/26/16 1342      Ambulation/Gait   Ambulation/Gait Yes   Ambulation/Gait Assistance 6: Modified independent (Device/Increase time)   Ambulation/Gait Assistance Details cues for incr. initial heel contact, foot flat, push off for more normal gait pattern with increased Lt arm swing   Ambulation Distance (Feet) 360 Feet   Assistive device None   Gait Pattern Step-through pattern;Decreased arm swing - left   Ambulation Surface Level;Indoor     Exercises   Exercises See below for postural exercise for  stretching     Lumbar Exercises: Stretches   Active Hamstring Stretch 1 rep;60 seconds  bil. LE's (runner's stretch)     Knee/Hip Exercises: Stretches   Press photographer Both;1 rep     Knee/Hip Exercises: Aerobic   Nustep SciFit level 3.0 x 3" due to time constraint     Pt performed "Y" and "W" stretches at wall for trunk and shoulder stretching - 60 sec hold x 1 rep each      PWR [facility]) - 11/26/16 2120    PWR! Up x10   PWR! Rock x10   PWR! Twist x10   PWR Step x10 bil. LE's   Comments tactile cues for posterior weight shift with shoulder extension ipsilateral side     Quadruped position; pt performed RLE and LLE hip flexion/extension x 10 reps each with cues to move leg fast and with Big movement each direction  Trunk stretch - in quadruped - UE's extended and turned to one side, with hips/pelvis rotated toward opposite heel - added this stretch  to HEP      PT Education - 11/26/16 2123    Education provided Yes   Education Details added trunk rotation stretch in quadruped position - hands toward 1 side with hips back toward opposite heel - instructed to do both  sides   Person(s) Educated Patient   Methods Explanation;Demonstration;Handout   Comprehension Verbalized understanding;Returned demonstration          PT Short Term Goals - 10/17/16 1417      PT SHORT TERM GOAL #1   Title Pt will perform HEP with family supervision, for improved balance, transfers, posture, mobility.  TARGET 10/16/16   Status Achieved     PT SHORT TERM GOAL #2   Title Pt will perform at least 6 of 10 reps of sit<>stand transfers with minimal to no UE support, for improved functional strength and transfer efficiency.   Status Achieved     PT SHORT TERM GOAL #3   Title Pt will improve TUG score to less than or equal to 15 seconds for decreased fall risk.   Status Achieved     PT SHORT TERM GOAL #4   Title Pt will ambulate at least 300 ft with least restrictive assistive device  modified independently, for improved household and short distance community gait.   Status Achieved     PT SHORT TERM GOAL #5   Title Pt will verbalize understanding of local Parkinson's disease-related resources.   Baseline handouts issued 10/15/16   Status Achieved           PT Long Term Goals - 11/13/16 0837      PT LONG TERM GOAL #1   Title Pt will verbalize understanding of fall prevention in the home environment.  TARGET 11/16/16   Status Achieved     PT LONG TERM GOAL #2   Title Pt will improve TUG score to less than or equal to 13.5 seconds for decreased fall risk.   Status Achieved     PT LONG TERM GOAL #3   Title Pt will improve TUG cognitive to less than or equal to 15 seconds for decreased fall risk/improved dual tasking with gait.   Status Achieved     PT LONG TERM GOAL #4   Title Pt will improve MiniBESTest score to at least 16/28 for decreased fall risk.   Baseline 18/28 score on 11/12/16   Status Achieved     PT LONG TERM GOAL #5   Title Pt will improve gait velocity to at least 2 ft/sec for improved gait efficiency and safety.   Status Achieved     Additional Long Term Goals   Additional Long Term Goals Yes     PT LONG TERM GOAL #6   Title Pt will perform updated/progressive HEP with family's supervision.  TARGET 12/13/16   Time 4   Period Weeks   Status New     PT LONG TERM GOAL #7   Title Pt will verbalize plans for continued community fitness upon D/C from PT.  TARGET 12/13/16   Time 4   Period Weeks   Status New               Plan - 11/26/16 2125    Clinical Impression Statement Pt has fear of LOB posteriorly with tall kneeling exercise and with stepping backward with weight shift with UE movement, allowing only 1 UE support with this exercise.  Pt needs tactile cues to achieve more hip extension for more erect posture; balance with stepping posteriorly improved with repetition. Gait pattern improved with cues to incr. initial heel contact  to increase foot clearance in swing phase of gait.   Rehab Potential Good   PT Frequency 2x / week   PT Duration 4 weeks   PT Treatment/Interventions Functional  mobility training;Gait training;Therapeutic activities;Therapeutic exercise;Neuromuscular re-education;Balance training;Patient/family education;DME Instruction;ADLs/Self Care Home Management   PT Next Visit Plan continue backward stepping with weight shifting activities; cont stretching, gait training with deviaitons minimized   Recommended Other Services Pt requesting speech consult to address voice volume   Consulted and Agree with Plan of Care Patient      Patient will benefit from skilled therapeutic intervention in order to improve the following deficits and impairments:  Abnormal gait, Decreased balance, Decreased mobility, Decreased safety awareness, Difficulty walking, Postural dysfunction, Impaired tone, Decreased endurance, Decreased strength  Visit Diagnosis: Other abnormalities of gait and mobility  Unsteadiness on feet     Problem List Patient Active Problem List   Diagnosis Date Noted  . Muscle weakness of lower extremity 01/17/2015  . Parkinson disease ([place]) 01/17/2015  . BV (bacterial vaginosis) 02/18/2013  . Fibroid uterus 11/13/2012  . Benign colonic polyp 10/02/2012    Alda Lea, PT 11/26/2016, 9:35 PM  [facility] [address] [place], [place], [address] Phone: [phone]   Fax:  [phone]  Name: [name] [name] MRN: [id number] Date of Birth: [birth year]

## 2016-11-28 ENCOUNTER — Ambulatory Visit: Payer: Medicare Other | Admitting: Physical Therapy

## 2016-11-28 ENCOUNTER — Ambulatory Visit: Payer: Medicare Other | Attending: Neurology | Admitting: Occupational Therapy

## 2016-11-28 ENCOUNTER — Encounter: Payer: Self-pay | Admitting: Physical Therapy

## 2016-11-28 ENCOUNTER — Telehealth: Payer: Self-pay | Admitting: Occupational Therapy

## 2016-11-28 DIAGNOSIS — R29898 Other symptoms and signs involving the musculoskeletal system: Secondary | ICD-10-CM | POA: Diagnosis not present

## 2016-11-28 DIAGNOSIS — R278 Other lack of coordination: Secondary | ICD-10-CM | POA: Insufficient documentation

## 2016-11-28 DIAGNOSIS — R2681 Unsteadiness on feet: Secondary | ICD-10-CM | POA: Insufficient documentation

## 2016-11-28 DIAGNOSIS — R2689 Other abnormalities of gait and mobility: Secondary | ICD-10-CM

## 2016-11-28 DIAGNOSIS — R29818 Other symptoms and signs involving the nervous system: Secondary | ICD-10-CM | POA: Diagnosis not present

## 2016-11-28 DIAGNOSIS — R293 Abnormal posture: Secondary | ICD-10-CM | POA: Insufficient documentation

## 2016-11-28 DIAGNOSIS — R498 Other voice and resonance disorders: Secondary | ICD-10-CM

## 2016-11-28 NOTE — Telephone Encounter (Signed)
I will make the referral for speech therapy evaluation.

## 2016-11-28 NOTE — Therapy (Signed)
[facility] [address] [place], [place], [address] Phone: [phone]   Fax:  [phone]  Physical Therapy Treatment  Patient Details  Name: [name] [name] MRN: [id number] Date of Birth: 08/12/1951 Referring Provider: Dr. Jannifer Franklin  Encounter Date: 11/28/2016      PT End of Session - 11/28/16 1559    Visit Number 17   Number of Visits 21   Date for PT Re-Evaluation 01/12/17   Authorization Type Medicare Primary, [employer] 2nd-GCODE every 10th visit   PT Start Time 1452   PT Stop Time 1532   PT Time Calculation (min) 40 min   Equipment Utilized During Treatment Gait belt      Past Medical History:  Diagnosis Date  . Fibroid   . GERD (gastroesophageal reflux disease)   . Muscle weakness of lower extremity 01/17/2015  . Osteopenia   . Parkinson disease ([place]) 01/17/2015    Past Surgical History:  Procedure Laterality Date  . FINGER SURGERY    . MOUTH SURGERY    . TOE SURGERY    . TONSILLECTOMY    . TUBAL LIGATION      There were no vitals filed for this visit.      Subjective Assessment - 11/28/16 1454    Subjective Pt reports performing updated HEP from last visit; also uses stationary bike 2x/day.   Patient Stated Goals Pt's goal for therapy is to reduce rigidity, relax body, have more strength and stamina.   Currently in Pain? No/denies                         [facility] Adult PT Treatment/Exercise - 11/28/16 0001      Ambulation/Gait   Ambulation/Gait Yes   Ambulation/Gait Assistance 5: Supervision   Ambulation/Gait Assistance Details Working on greater step length and inital heel strike and terminal toe off and maintaining bilateral arm swing.   Ambulation Distance (Feet) 300 Feet  200+ 100   Assistive device None   Gait Pattern Step-through pattern;Decreased arm swing - left   Ambulation Surface Level;Indoor   Pre-Gait Activities 1. worked on Aeronautical engineer in parallel bars  then transitioned onto track to work on carry over of above technique.    2.stagger stance balance holds of different phases in gait, cueing for posture and progressed with head turns; pt required  intermittant UE suppport.     Exercises   Exercises Lumbar  performed trunk stretch given 1/30 with cues for body mechanics; see pt instruction.            Balance Exercises - 11/28/16 1551      Balance Exercises: Standing   Wall Bumps Hip   Wall Bumps-Hips Eyes opened;Anterior/posterior  worked on engaging ankle stategies and hip extension and posture with forward weight shift.   Heel Raises Limitations heel walk working on balance with posterior wt shift.  progressed form 2-1 UE support             PT Short Term Goals - 10/17/16 1417      PT SHORT TERM GOAL #1   Title Pt will perform HEP with family supervision, for improved balance, transfers, posture, mobility.  TARGET 10/16/16   Status Achieved     PT SHORT TERM GOAL #2   Title Pt will perform at least 6 of 10 reps of sit<>stand transfers with minimal to no UE support, for improved functional strength and transfer efficiency.   Status Achieved  PT SHORT TERM GOAL #3   Title Pt will improve TUG score to less than or equal to 15 seconds for decreased fall risk.   Status Achieved     PT SHORT TERM GOAL #4   Title Pt will ambulate at least 300 ft with least restrictive assistive device modified independently, for improved household and short distance community gait.   Status Achieved     PT SHORT TERM GOAL #5   Title Pt will verbalize understanding of local Parkinson's disease-related resources.   Baseline handouts issued 10/15/16   Status Achieved           PT Long Term Goals - 11/13/16 0837      PT LONG TERM GOAL #1   Title Pt will verbalize understanding of fall prevention in the home environment.  TARGET 11/16/16   Status Achieved     PT LONG TERM GOAL #2   Title Pt will improve TUG score to less than  or equal to 13.5 seconds for decreased fall risk.   Status Achieved     PT LONG TERM GOAL #3   Title Pt will improve TUG cognitive to less than or equal to 15 seconds for decreased fall risk/improved dual tasking with gait.   Status Achieved     PT LONG TERM GOAL #4   Title Pt will improve MiniBESTest score to at least 16/28 for decreased fall risk.   Baseline 18/28 score on 11/12/16   Status Achieved     PT LONG TERM GOAL #5   Title Pt will improve gait velocity to at least 2 ft/sec for improved gait efficiency and safety.   Status Achieved     Additional Long Term Goals   Additional Long Term Goals Yes     PT LONG TERM GOAL #6   Title Pt will perform updated/progressive HEP with family's supervision.  TARGET 12/13/16   Time 4   Period Weeks   Status New     PT LONG TERM GOAL #7   Title Pt will verbalize plans for continued community fitness upon D/C from PT.  TARGET 12/13/16   Time 4   Period Weeks   Status New               Plan - 11/28/16 1600    Clinical Impression Statement Pt reports having fear of falling with gait training but was able to improve gait pattern with repetition and cues for increased inital heel contact and longer step length.  Worked in ankle/hip strategies with wall bumps progressing onto compliant surface with intermittent UE support.                                             Rehab Potential Good   PT Frequency 2x / week   PT Duration 4 weeks   PT Treatment/Interventions Functional mobility training;Gait training;Therapeutic activities;Therapeutic exercise;Neuromuscular re-education;Balance training;Patient/family education;DME Instruction;ADLs/Self Care Home Management   PT Next Visit Plan continue backward stepping with weight shifting activities; cont stretching, gait training with deviaitons minimized   Consulted and Agree with Plan of Care Patient      Patient will benefit from skilled therapeutic intervention in order to improve the  following deficits and impairments:  Abnormal gait, Decreased balance, Decreased mobility, Decreased safety awareness, Difficulty walking, Postural dysfunction, Impaired tone, Decreased endurance, Decreased strength  Visit Diagnosis: Other abnormalities of gait and  mobility  Unsteadiness on feet  Abnormal posture     Problem List Patient Active Problem List   Diagnosis Date Noted  . Muscle weakness of lower extremity 01/17/2015  . Parkinson disease ([place]) 01/17/2015  . BV (bacterial vaginosis) 02/18/2013  . Fibroid uterus 11/13/2012  . Benign colonic polyp 10/02/2012    Bjorn Loser, PTA  11/28/16, 4:08 PM [facility] [address] [place], [place], [address] Phone: [phone]   Fax:  [phone]  Name: VISTA LEAZER MRN: [id number] Date of Birth: 1951/08/02

## 2016-11-28 NOTE — Telephone Encounter (Signed)
Dr. Jannifer Franklin,   [place] reports her family has a difficult time hearing her due to low speech volume related to Parkinson's disease. She requests a referral to [facility]. If you agree please make this referral.  Thanks,  Theone Murdoch, OTR/L

## 2016-11-28 NOTE — Therapy (Signed)
[facility] [address] [place], [place], [address] Phone: [phone]   Fax:  [phone]  Occupational Therapy Treatment  Patient Details  Name: [name] [name] MRN: [id number] Date of Birth: [birth year] Referring Provider: Dr. Jannifer Franklin  Encounter Date: 11/28/2016      OT End of Session - 11/28/16 1624    Visit Number 7   Number of Visits 17   Date for OT Re-Evaluation 12/26/16   Authorization Type Medicare   Authorization - Visit Number 7   Authorization - Number of Visits 10   OT Start Time [id number]   OT Stop Time 1615   OT Time Calculation (min) 41 min   Activity Tolerance Patient tolerated treatment well   Behavior During Therapy [facility] for tasks assessed/performed      Past Medical History:  Diagnosis Date  . Fibroid   . GERD (gastroesophageal reflux disease)   . Muscle weakness of lower extremity 01/17/2015  . Osteopenia   . Parkinson disease ([place]) 01/17/2015    Past Surgical History:  Procedure Laterality Date  . FINGER SURGERY    . MOUTH SURGERY    . TOE SURGERY    . TONSILLECTOMY    . TUBAL LIGATION      There were no vitals filed for this visit.      Subjective Assessment - 11/28/16 1601    Subjective  Denies pain   Pertinent History see epic   Patient Stated Goals improve independence with ADLS/ IADLs   Currently in Pain? No/denies         Treatment: Therapist checked progress towards goals. Pt demonstrates excellent progress. Dynamic step and reach to both sides with trunk rotation, min v.c. For finger and elbow ext. Arm bike x 5 mins level 1 for conditioning, pt maintained 29-35 rpm                PWR [facility]) - 11/28/16 1602    PWR! exercises Moves in [place];Moves in supine   PWR! Up x10   PWR! Rock x10   PWR! Twist x20   PWR! Step x10   Comments min v.c./ demo for larger amplitude movements   PWR! Up x10   PWR! Rock x10   PWR! Twist x10   PWR! Step x10   Comments min-mod v.c               OT Short Term Goals - 11/28/16 1532      OT SHORT TERM GOAL #1   Title I with PD specific HEP.   Time 4   Period Weeks   Status Achieved     OT SHORT TERM GOAL #2   Title Pt will verbalize understanding of adapted strategies for ADLs/ IADLS in order to maximize safety and independence   Time 4   Period Weeks   Status Achieved     OT SHORT TERM GOAL #3   Title Pt will demonstrate improved independnece with ADLS as evidenced by decreasing 3 button/ unbutton to 38 secs or less   Time 4   Period Weeks   Status Achieved  34 secs     OT SHORT TERM GOAL #4   Title Pt will demonstrate ability to retrieve a lightweight object with RUE at 130 shoulder flexion and -10 elbow ext with pain less than or equal to 2/10   Time 4   Period Weeks   Status Achieved  130, with -10 elbow, no pain  OT Long Term Goals - 11/28/16 1605      OT LONG TERM GOAL #1   Title Pt will verbalize understanding of PD related community resources   Time 8   Period Weeks   Status On-going     OT LONG TERM GOAL #2   Title Pt will improve functional reaching  and decrease fall risk as evidenced by increasing bilateral standing functional reach to 10 inches or greater bilaterally.   Baseline RUE 7.5 inches, RUE 9.5 inches   Time 8   Period Weeks   Status On-going     OT LONG TERM GOAL #3   Title Pt will verbalize understanding of ways to prevent future PD related complications, including ways to reduce shoulder pain and tightness   Time 8   Period Weeks   Status On-going     OT LONG TERM GOAL #4   Title Pt will verbalize understanding of ways to keep thinking skills sharp.   Time 4   Period Weeks   Status Achieved               Plan - 11/28/16 1601    Clinical Impression Statement Pt demonstrates excellent overall progress towards short term goals.   Rehab Potential Good   OT Frequency 2x / week   OT Duration 8 weeks   OT  Treatment/Interventions Self-care/ADL training;Moist Heat;Fluidtherapy;DME and/or AE instruction;Splinting;Patient/family education;Balance training;Therapeutic exercises;Gait Training;Contrast Bath;Traction;Therapeutic activities;Ultrasound;Therapeutic exercise;Cognitive remediation/compensation;Passive range of motion;Functional Mobility Training;Neuromuscular education;Electrical Stimulation;Parrafin;Energy conservation;Manual Therapy   Plan continue to work towards long term goals   Consulted and Agree with Plan of Care Patient      Patient will benefit from skilled therapeutic intervention in order to improve the following deficits and impairments:  Abnormal gait, Decreased coordination, Decreased range of motion, Difficulty walking, Impaired flexibility, Decreased safety awareness, Decreased endurance, Decreased activity tolerance, Decreased knowledge of precautions, Impaired tone, Decreased balance, Decreased knowledge of use of DME, Impaired UE functional use, Pain, Impaired vision/preception, Impaired perceived functional ability, Decreased strength, Decreased mobility, Decreased cognition  Visit Diagnosis: Other symptoms and signs involving the nervous system  Other abnormalities of gait and mobility  Other symptoms and signs involving the musculoskeletal system  Other lack of coordination    Problem List Patient Active Problem List   Diagnosis Date Noted  . Muscle weakness of lower extremity 01/17/2015  . Parkinson disease ([place]) 01/17/2015  . BV (bacterial vaginosis) 02/18/2013  . Fibroid uterus 11/13/2012  . Benign colonic polyp 10/02/2012    Rashard Ryle 11/28/2016, 4:25 PM  [facility] [address] [place], [place], [address] Phone: [phone]   Fax:  [phone]  Name: [name] [name] MRN: [id number] Date of Birth: [birth year]

## 2016-11-28 NOTE — Patient Instructions (Signed)
Quick Contraction: Gravity Resisted (Quadruped)   Copyright  VHI. All rights reserved.  Quick Contraction: Gravity Resisted (Quadruped)    On hands and knees, quickly squeeze then fully relax pelvic floor. Perform _2__ sets of _15 sec hold_. Rest for _5__ seconds between sets. Do 2-3___ times a day.  HANDS TO ONE SIDE ---- HIPS GO TOWARD OPPOSITE HEEL FOR TRUNK ROTATION STRETCH Copyright  VHI. All rights reserved.

## 2016-12-03 ENCOUNTER — Ambulatory Visit: Payer: Medicare Other | Admitting: Occupational Therapy

## 2016-12-03 ENCOUNTER — Ambulatory Visit: Payer: Medicare Other | Admitting: Physical Therapy

## 2016-12-03 DIAGNOSIS — R29898 Other symptoms and signs involving the musculoskeletal system: Secondary | ICD-10-CM

## 2016-12-03 DIAGNOSIS — R2689 Other abnormalities of gait and mobility: Secondary | ICD-10-CM

## 2016-12-03 DIAGNOSIS — R278 Other lack of coordination: Secondary | ICD-10-CM | POA: Diagnosis not present

## 2016-12-03 DIAGNOSIS — R29818 Other symptoms and signs involving the nervous system: Secondary | ICD-10-CM

## 2016-12-03 DIAGNOSIS — R293 Abnormal posture: Secondary | ICD-10-CM | POA: Diagnosis not present

## 2016-12-03 DIAGNOSIS — R2681 Unsteadiness on feet: Secondary | ICD-10-CM

## 2016-12-03 NOTE — Therapy (Signed)
[facility] [address] [place], [place], [address] Phone: [phone]   Fax:  [phone]  Physical Therapy Treatment  Patient Details  Name: [name] [name] MRN: [id number] Date of Birth: 11-30-1950 Referring Provider: Dr. Jannifer Franklin  Encounter Date: 12/03/2016      PT End of Session - 12/03/16 1611    Visit Number 18   Number of Visits 21   Date for PT Re-Evaluation 01/12/17   Authorization Type Medicare Primary, [employer] 2nd-GCODE every 10th visit   PT Start Time 1400   PT Stop Time 1442   PT Time Calculation (min) 42 min   Activity Tolerance Patient tolerated treatment well   Behavior During Therapy [facility] for tasks assessed/performed      Past Medical History:  Diagnosis Date  . Fibroid   . GERD (gastroesophageal reflux disease)   . Muscle weakness of lower extremity 01/17/2015  . Osteopenia   . Parkinson disease ([place]) 01/17/2015    Past Surgical History:  Procedure Laterality Date  . FINGER SURGERY    . MOUTH SURGERY    . TOE SURGERY    . TONSILLECTOMY    . TUBAL LIGATION      There were no vitals filed for this visit.      Subjective Assessment - 12/03/16 1403    Subjective doing exercises, reports some mild pain in LUE-seems to be with exertion.  No pain currently.  Gets nervous waiting for therapy.   Patient Stated Goals Pt's goal for therapy is to reduce rigidity, relax body, have more strength and stamina.   Currently in Pain? No/denies                         [facility] Adult PT Treatment/Exercise - 12/03/16 1405      Lumbar Exercises: Stretches   Active Hamstring Stretch 3 reps;30 seconds   Active Hamstring Stretch Limitations supine with strap   Quadruped Mid Back Stretch 3 reps;30 seconds   Quadruped Mid Back Stretch Limitations bil   Quad Stretch 3 reps;30 seconds   Quad Stretch Limitations prone with strap     Lumbar Exercises: Quadruped   Straight Leg Raise 10  reps;3 seconds   Straight Leg Raises Limitations bil     Knee/Hip Exercises: Aerobic   Nustep SciFit L2.0 x 8 min; RPM > 50     Knee/Hip Exercises: Seated   Other Seated Knee/Hip Exercises tall kneeling shoulder flexion with 5 sec hold for tall posture x 10; trunk rotation x 10 bil                  PT Short Term Goals - 10/17/16 1417      PT SHORT TERM GOAL #1   Title Pt will perform HEP with family supervision, for improved balance, transfers, posture, mobility.  TARGET 10/16/16   Status Achieved     PT SHORT TERM GOAL #2   Title Pt will perform at least 6 of 10 reps of sit<>stand transfers with minimal to no UE support, for improved functional strength and transfer efficiency.   Status Achieved     PT SHORT TERM GOAL #3   Title Pt will improve TUG score to less than or equal to 15 seconds for decreased fall risk.   Status Achieved     PT SHORT TERM GOAL #4   Title Pt will ambulate at least 300 ft with least restrictive assistive device modified independently, for improved household and  short distance community gait.   Status Achieved     PT SHORT TERM GOAL #5   Title Pt will verbalize understanding of local Parkinson's disease-related resources.   Baseline handouts issued 10/15/16   Status Achieved           PT Long Term Goals - 11/13/16 0837      PT LONG TERM GOAL #1   Title Pt will verbalize understanding of fall prevention in the home environment.  TARGET 11/16/16   Status Achieved     PT LONG TERM GOAL #2   Title Pt will improve TUG score to less than or equal to 13.5 seconds for decreased fall risk.   Status Achieved     PT LONG TERM GOAL #3   Title Pt will improve TUG cognitive to less than or equal to 15 seconds for decreased fall risk/improved dual tasking with gait.   Status Achieved     PT LONG TERM GOAL #4   Title Pt will improve MiniBESTest score to at least 16/28 for decreased fall risk.   Baseline 18/28 score on 11/12/16   Status  Achieved     PT LONG TERM GOAL #5   Title Pt will improve gait velocity to at least 2 ft/sec for improved gait efficiency and safety.   Status Achieved     Additional Long Term Goals   Additional Long Term Goals Yes     PT LONG TERM GOAL #6   Title Pt will perform updated/progressive HEP with family's supervision.  TARGET 12/13/16   Time 4   Period Weeks   Status New     PT LONG TERM GOAL #7   Title Pt will verbalize plans for continued community fitness upon D/C from PT.  TARGET 12/13/16   Time 4   Period Weeks   Status New               Plan - 12/03/16 1612    Clinical Impression Statement Session focused on stretching today as well as postural control and strengthening exercises.  Reinforced goals of PT to maximize function and provide education to continue with community fitness and HEP.  Pt reluctant to d/c with PT stating "but I like you guys" but understanding that plan will be to d/c next week.  Pt has extensive HEP and has education on community fitness resources for pts with Parkinson's.    PT Treatment/Interventions Functional mobility training;Gait training;Therapeutic activities;Therapeutic exercise;Neuromuscular re-education;Balance training;Patient/family education;DME Instruction;ADLs/Self Care Home Management   PT Next Visit Plan continue backward stepping with weight shifting activities; cont stretching, gait training with deviaitons minimized, plan to wrap up next week.   Consulted and Agree with Plan of Care Patient      Patient will benefit from skilled therapeutic intervention in order to improve the following deficits and impairments:  Abnormal gait, Decreased balance, Decreased mobility, Decreased safety awareness, Difficulty walking, Postural dysfunction, Impaired tone, Decreased endurance, Decreased strength  Visit Diagnosis: Other symptoms and signs involving the nervous system  Other abnormalities of gait and mobility  Unsteadiness on  feet     Problem List Patient Active Problem List   Diagnosis Date Noted  . Muscle weakness of lower extremity 01/17/2015  . Parkinson disease ([place]) 01/17/2015  . BV (bacterial vaginosis) 02/18/2013  . Fibroid uterus 11/13/2012  . Benign colonic polyp 10/02/2012      Laureen Abrahams, PT, DPT 12/03/16 4:15 PM     [facility] [address]  [place], [place], [address] Phone: [phone]   Fax:  [phone]  Name: SIMAR ANTRIM MRN: [id number] Date of Birth: February 22, 1951

## 2016-12-03 NOTE — Therapy (Signed)
[name] [address] [place], [place], [address] Phone: [phone]   Fax:  [phone]  Occupational Therapy Treatment  Patient Details  Name: [name] [name] MRN: [id number] Date of Birth: 15-Apr-1951 Referring Provider: Dr. Jannifer Franklin  Encounter Date: 12/03/2016      OT End of Session - 12/03/16 1541    Visit Number 8   Number of Visits 17   Date for OT Re-Evaluation 12/26/16   Authorization Type Medicare   Authorization - Visit Number 8   Authorization - Number of Visits 10   OT Start Time [id number]   OT Stop Time 1532   OT Time Calculation (min) 43 min   Activity Tolerance Patient tolerated treatment well   Behavior During Therapy [facility] for tasks assessed/performed      Past Medical History:  Diagnosis Date  . Fibroid   . GERD (gastroesophageal reflux disease)   . Muscle weakness of lower extremity 01/17/2015  . Osteopenia   . Parkinson disease ([place]) 01/17/2015    Past Surgical History:  Procedure Laterality Date  . FINGER SURGERY    . MOUTH SURGERY    . TOE SURGERY    . TONSILLECTOMY    . TUBAL LIGATION      There were no vitals filed for this visit.      Subjective Assessment - 12/03/16 1538    Subjective  "I learned that I have to move bigger and more deliberate and more powerful"   Pertinent History see epic   Patient Stated Goals improve independence with ADLS/ IADLs   Currently in Pain? No/denies        Neuro re-ed:  PWR! Moves (up, rock, twist) in prone x 20 each with min-mod cues For incr movement amplitude.  Pt with mod-max difficulty with attempts of PWR! step  Arm bike x50min level 1 for reciprocal movement with cues/target of at least 35-40rpms for intensity while maintaining movement amplitude/reciprocal movement.   Pt maintained approx 25-30 rpms.   Sliding cards off table by using PWR! Hands with focus on finger ext and min cues for incr movement amplitude, incr power, and effort  with movement.  Pt demo mod difficulty with each hand.  Pt reports that she work on this at home.  Also recommended pt try "flicking" water off hands and work on clapping loud to encourage incr movement amplitude and power with movement.                     OT Education - 12/03/16 1539    Education Details PWR! moves in prone (up, rock, twist).  Pt had difficulty with step (instructed pt not to do this at home right now)   Person(s) Educated Patient   Methods Explanation;Demonstration;Handout;Verbal cues   Comprehension Verbalized understanding;Returned demonstration;Verbal cues required  min-mod cues for large amplitude movements          OT Short Term Goals - 11/28/16 1532      OT SHORT TERM GOAL #1   Title I with PD specific HEP.   Time 4   Period Weeks   Status Achieved     OT SHORT TERM GOAL #2   Title Pt will verbalize understanding of adapted strategies for ADLs/ IADLS in order to maximize safety and independence   Time 4   Period Weeks   Status Achieved     OT SHORT TERM GOAL #3   Title Pt will demonstrate improved independnece with ADLS as evidenced by decreasing  3 button/ unbutton to 38 secs or less   Time 4   Period Weeks   Status Achieved  34 secs     OT SHORT TERM GOAL #4   Title Pt will demonstrate ability to retrieve a lightweight object with RUE at 130 shoulder flexion and -10 elbow ext with pain less than or equal to 2/10   Time 4   Period Weeks   Status Achieved  130, with -10 elbow, no pain           OT Long Term Goals - 11/28/16 1605      OT LONG TERM GOAL #1   Title Pt will verbalize understanding of PD related community resources   Time 8   Period Weeks   Status On-going     OT LONG TERM GOAL #2   Title Pt will improve functional reaching  and decrease fall risk as evidenced by increasing bilateral standing functional reach to 10 inches or greater bilaterally.   Baseline RUE 7.5 inches, RUE 9.5 inches   Time 8   Period  Weeks   Status On-going     OT LONG TERM GOAL #3   Title Pt will verbalize understanding of ways to prevent future PD related complications, including ways to reduce shoulder pain and tightness   Time 8   Period Weeks   Status On-going     OT LONG TERM GOAL #4   Title Pt will verbalize understanding of ways to keep thinking skills sharp.   Time 4   Period Weeks   Status Achieved               Plan - 12/03/16 1542    Clinical Impression Statement Pt continues to demo excellent progress with improved affect, posture, functional mobility, and ADL performance.   Rehab Potential Good   OT Frequency 2x / week   OT Duration 8 weeks   OT Treatment/Interventions Self-care/ADL training;Moist Heat;Fluidtherapy;DME and/or AE instruction;Splinting;Patient/family education;Balance training;Therapeutic exercises;Gait Training;Contrast Bath;Traction;Therapeutic activities;Ultrasound;Therapeutic exercise;Cognitive remediation/compensation;Passive range of motion;Functional Mobility Training;Neuromuscular education;Electrical Stimulation;Parrafin;Energy conservation;Manual Therapy   Plan continue with large amplitude movements; review PWR! in prone (up, rock, twist)   OT Home Exercise Plan Education provided:  seated PWR (by PT), quadraped PWR, coordination HEP; ADL strategies; PWR! prone (up, rock, twist)   Consulted and Agree with Plan of Care Patient      Patient will benefit from skilled therapeutic intervention in order to improve the following deficits and impairments:  Abnormal gait, Decreased coordination, Decreased range of motion, Difficulty walking, Impaired flexibility, Decreased safety awareness, Decreased endurance, Decreased activity tolerance, Decreased knowledge of precautions, Impaired tone, Decreased balance, Decreased knowledge of use of DME, Impaired UE functional use, Pain, Impaired vision/preception, Impaired perceived functional ability, Decreased strength, Decreased  mobility, Decreased cognition  Visit Diagnosis: Other symptoms and signs involving the nervous system  Other symptoms and signs involving the musculoskeletal system  Abnormal posture  Unsteadiness on feet  Other abnormalities of gait and mobility  Other lack of coordination    Problem List Patient Active Problem List   Diagnosis Date Noted  . Muscle weakness of lower extremity 01/17/2015  . Parkinson disease ([place]) 01/17/2015  . BV (bacterial vaginosis) 02/18/2013  . Fibroid uterus 11/13/2012  . Benign colonic polyp 10/02/2012    [facility] 12/03/2016, 3:43 PM  [facility] [address] [place], [place], [address] Phone: [phone]   Fax:  [phone]  Name: TRYSTAN LINNANE MRN: [id number] Date of Birth: 12-16-1950  Vianne Bulls, OTR/L [facility] [address] [place], [place]  [address] [phone] phone [phone] 12/03/16 3:43 PM

## 2016-12-05 ENCOUNTER — Ambulatory Visit: Payer: Medicare Other | Admitting: Occupational Therapy

## 2016-12-05 ENCOUNTER — Ambulatory Visit: Payer: Medicare Other

## 2016-12-09 ENCOUNTER — Ambulatory Visit: Payer: Medicare Other | Admitting: Physical Therapy

## 2016-12-09 ENCOUNTER — Ambulatory Visit: Payer: Medicare Other | Admitting: Occupational Therapy

## 2016-12-09 DIAGNOSIS — R29818 Other symptoms and signs involving the nervous system: Secondary | ICD-10-CM | POA: Diagnosis not present

## 2016-12-09 DIAGNOSIS — R29898 Other symptoms and signs involving the musculoskeletal system: Secondary | ICD-10-CM | POA: Diagnosis not present

## 2016-12-09 DIAGNOSIS — R293 Abnormal posture: Secondary | ICD-10-CM

## 2016-12-09 DIAGNOSIS — R2681 Unsteadiness on feet: Secondary | ICD-10-CM | POA: Diagnosis not present

## 2016-12-09 DIAGNOSIS — R2689 Other abnormalities of gait and mobility: Secondary | ICD-10-CM

## 2016-12-09 DIAGNOSIS — R278 Other lack of coordination: Secondary | ICD-10-CM

## 2016-12-09 NOTE — Patient Instructions (Signed)
Exercise principles:  Arm bike:  Aim for at least 40rpms (revolutions per minute).  Once you can stay at 40 or above, increase the time by 1-2 minutes or increase the rpms to 45 or greater.  Always make sure that it is difficult.  Aim for 20-30 min of aerobic exercise (arm bike, stationary bike, seated stepper)  3-4 times a week.  If you do classes or weights, use light weights and only shoulder height or below with arms.  Make sure you have good posture and perform with big movements.  Always make sure you are moving BIG before you add weights.    Upper Body Ergometer  (bike for home to put on table to use with arms or floor to use with feet)

## 2016-12-09 NOTE — Therapy (Signed)
[facility] [address] [place], [place], [address] Phone: [phone]   Fax:  [phone]  Occupational Therapy Treatment  Patient Details  Name: [name] [name] MRN: [id number] Date of Birth: [birth year] Referring Provider: Dr. Jannifer Franklin  Encounter Date: 12/09/2016      OT End of Session - 12/09/16 1743    Visit Number 9   Number of Visits 17   Date for OT Re-Evaluation 12/26/16   Authorization Type Medicare   Authorization - Visit Number 9   Authorization - Number of Visits 10   OT Start Time [id number]   OT Stop Time 1530   OT Time Calculation (min) 42 min   Activity Tolerance Patient tolerated treatment well   Behavior During Therapy [facility] for tasks assessed/performed      Past Medical History:  Diagnosis Date  . Fibroid   . GERD (gastroesophageal reflux disease)   . Muscle weakness of lower extremity 01/17/2015  . Osteopenia   . Parkinson disease ([place]) 01/17/2015    Past Surgical History:  Procedure Laterality Date  . FINGER SURGERY    . MOUTH SURGERY    . TOE SURGERY    . TONSILLECTOMY    . TUBAL LIGATION      There were no vitals filed for this visit.      Subjective Assessment - 12/09/16 1736    Subjective  Pt reports that she feels better after doing the arm bike.  Pt also reports that she feels comfortable holding her grandbaby now.   Pertinent History see epic   Patient Stated Goals improve independence with ADLS/ IADLs   Currently in Pain? No/denies          PWR! Moves (up, rock, twist) in prone x 10-20 each with min cues For incr movement amplitude/technique.  Educated pt in functional strengthening benefits with PWR! Moves (particularly with prone and quadraped positions) due to pt questions.  Arm bike x6 min level 1 for reciprocal movement with cues/target of at least 40rpms for intensity while maintaining movement amplitude/reciprocal movement.   Pt maintained 30rpms.  Flipping cards  with each hand with min cueing for large amplitude movements.                          OT Education - 12/09/16 1742    Education Details Appropriate community fitness activities (pt is interested in [facility]); principles of exercise; use of UB ergometer--also see pt instructions   Person(s) Educated Patient   Methods Explanation;Handout   Comprehension Verbalized understanding          OT Short Term Goals - 11/28/16 1532      OT SHORT TERM GOAL #1   Title I with PD specific HEP.   Time 4   Period Weeks   Status Achieved     OT SHORT TERM GOAL #2   Title Pt will verbalize understanding of adapted strategies for ADLs/ IADLS in order to maximize safety and independence   Time 4   Period Weeks   Status Achieved     OT SHORT TERM GOAL #3   Title Pt will demonstrate improved independnece with ADLS as evidenced by decreasing 3 button/ unbutton to 38 secs or less   Time 4   Period Weeks   Status Achieved  34 secs     OT SHORT TERM GOAL #4   Title Pt will demonstrate ability to retrieve a lightweight object with RUE at  130 shoulder flexion and -10 elbow ext with pain less than or equal to 2/10   Time 4   Period Weeks   Status Achieved  130, with -10 elbow, no pain           OT Long Term Goals - 11/28/16 1605      OT LONG TERM GOAL #1   Title Pt will verbalize understanding of PD related community resources   Time 8   Period Weeks   Status On-going     OT LONG TERM GOAL #2   Title Pt will improve functional reaching  and decrease fall risk as evidenced by increasing bilateral standing functional reach to 10 inches or greater bilaterally.   Baseline RUE 7.5 inches, RUE 9.5 inches   Time 8   Period Weeks   Status On-going     OT LONG TERM GOAL #3   Title Pt will verbalize understanding of ways to prevent future PD related complications, including ways to reduce shoulder pain and tightness   Time 8   Period Weeks   Status On-going      OT LONG TERM GOAL #4   Title Pt will verbalize understanding of ways to keep thinking skills sharp.   Time 4   Period Weeks   Status Achieved               Plan - 12/09/16 1743    Clinical Impression Statement Pt continues to progress towards goals and reports positive functional changes.   Rehab Potential Good   OT Frequency 2x / week   OT Duration 8 weeks   OT Treatment/Interventions Self-care/ADL training;Moist Heat;Fluidtherapy;DME and/or AE instruction;Splinting;Patient/family education;Balance training;Therapeutic exercises;Gait Training;Contrast Bath;Traction;Therapeutic activities;Ultrasound;Therapeutic exercise;Cognitive remediation/compensation;Passive range of motion;Functional Mobility Training;Neuromuscular education;Electrical Stimulation;Parrafin;Energy conservation;Manual Therapy   Plan continue with large amplitude movements; **G code next visit   OT Home Exercise Plan Education provided:  seated PWR (by PT), quadraped PWR, coordination HEP; ADL strategies; PWR! prone (up, rock, twist)   Consulted and Agree with Plan of Care Patient      Patient will benefit from skilled therapeutic intervention in order to improve the following deficits and impairments:  Abnormal gait, Decreased coordination, Decreased range of motion, Difficulty walking, Impaired flexibility, Decreased safety awareness, Decreased endurance, Decreased activity tolerance, Decreased knowledge of precautions, Impaired tone, Decreased balance, Decreased knowledge of use of DME, Impaired UE functional use, Pain, Impaired vision/preception, Impaired perceived functional ability, Decreased strength, Decreased mobility, Decreased cognition  Visit Diagnosis: Other symptoms and signs involving the nervous system  Other abnormalities of gait and mobility  Unsteadiness on feet  Other symptoms and signs involving the musculoskeletal system  Abnormal posture  Other lack of coordination    Problem  List Patient Active Problem List   Diagnosis Date Noted  . Muscle weakness of lower extremity 01/17/2015  . Parkinson disease ([place]) 01/17/2015  . BV (bacterial vaginosis) 02/18/2013  . Fibroid uterus 11/13/2012  . Benign colonic polyp 10/02/2012    [facility] 12/09/2016, 5:49 PM  [facility] [address] [place], [place], [address] Phone: [phone]   Fax:  [phone]  Name: AHRIA MENO MRN: [id number] Date of Birth: December 23, 1950   Vianne Bulls, OTR/L [facility] [address] [place], [place]  [phone] phone [phone] 12/09/16 5:50 PM

## 2016-12-09 NOTE — Therapy (Signed)
[facility] [address] [place], [place], [address] Phone: [phone]   Fax:  [phone]  Physical Therapy Treatment/Discharge  Patient Details  Name: [name] [name] MRN: [id number] Date of Birth: 1950/11/30 Referring Provider: Dr. Jannifer Franklin  Encounter Date: 12/09/2016      PT End of Session - 12/09/16 1428    Visit Number 19   Date for PT Re-Evaluation 01/12/17   Authorization Type Medicare Primary, [employer] 2nd-GCODE every 10th visit   PT Start Time 1405   PT Stop Time 1428   PT Time Calculation (min) 23 min   Activity Tolerance Patient tolerated treatment well   Behavior During Therapy [facility] for tasks assessed/performed      Past Medical History:  Diagnosis Date  . Fibroid   . GERD (gastroesophageal reflux disease)   . Muscle weakness of lower extremity 01/17/2015  . Osteopenia   . Parkinson disease ([place]) 01/17/2015    Past Surgical History:  Procedure Laterality Date  . FINGER SURGERY    . MOUTH SURGERY    . TOE SURGERY    . TONSILLECTOMY    . TUBAL LIGATION      There were no vitals filed for this visit.      Subjective Assessment - 12/09/16 1409    Subjective wants to know settings for the stepper; doing well   Patient Stated Goals Pt's goal for therapy is to reduce rigidity, relax body, have more strength and stamina.   Currently in Pain? No/denies                         [facility] Adult PT Treatment/Exercise - 12/09/16 1409      Self-Care   Other Self-Care Comments  educated on equipment safe to use at [employer] verbalized understanding     Lumbar Exercises: Stretches   Quadruped Mid Back Stretch 3 reps;20 seconds   Quadruped Mid Back Stretch Limitations bil     Knee/Hip Exercises: Aerobic   Nustep SciFit L2.0 x 8 min; RPM > 50                PT Education - 12/09/16 1428    Education provided Yes   Education Details see self care   Person(s)  Educated Patient   Methods Explanation;Handout   Comprehension Verbalized understanding          PT Short Term Goals - 10/17/16 1417      PT SHORT TERM GOAL #1   Title Pt will perform HEP with family supervision, for improved balance, transfers, posture, mobility.  TARGET 10/16/16   Status Achieved     PT SHORT TERM GOAL #2   Title Pt will perform at least 6 of 10 reps of sit<>stand transfers with minimal to no UE support, for improved functional strength and transfer efficiency.   Status Achieved     PT SHORT TERM GOAL #3   Title Pt will improve TUG score to less than or equal to 15 seconds for decreased fall risk.   Status Achieved     PT SHORT TERM GOAL #4   Title Pt will ambulate at least 300 ft with least restrictive assistive device modified independently, for improved household and short distance community gait.   Status Achieved     PT SHORT TERM GOAL #5   Title Pt will verbalize understanding of local Parkinson's disease-related resources.   Baseline handouts issued 10/15/16   Status Achieved  PT Long Term Goals - December 23, 2016 1429      PT LONG TERM GOAL #1   Title Pt will verbalize understanding of fall prevention in the home environment.  TARGET 11/16/16   Status Achieved     PT LONG TERM GOAL #2   Title Pt will improve TUG score to less than or equal to 13.5 seconds for decreased fall risk.   Status Achieved     PT LONG TERM GOAL #3   Title Pt will improve TUG cognitive to less than or equal to 15 seconds for decreased fall risk/improved dual tasking with gait.   Status Achieved     PT LONG TERM GOAL #4   Title Pt will improve MiniBESTest score to at least 16/28 for decreased fall risk.   Status Achieved     PT LONG TERM GOAL #5   Title Pt will improve gait velocity to at least 2 ft/sec for improved gait efficiency and safety.   Status Achieved     PT LONG TERM GOAL #6   Title Pt will perform updated/progressive HEP with family's supervision.   TARGET 12/13/16   Status Achieved     PT LONG TERM GOAL #7   Title Pt will verbalize plans for continued community fitness upon D/C from PT.  TARGET 12/13/16   Status Achieved               Plan - 12-23-2016 1429    Clinical Impression Statement Pt has met all goals and is ready for d/c.  Has well established HEP and plans to go to [facility].  Recommended continued exercise to maximize function and mobility.   PT Treatment/Interventions Functional mobility training;Gait training;Therapeutic activities;Therapeutic exercise;Neuromuscular re-education;Balance training;Patient/family education;DME Instruction;ADLs/Self Care Home Management   PT Next Visit Plan d/c PT today   Consulted and Agree with Plan of Care Patient      Patient will benefit from skilled therapeutic intervention in order to improve the following deficits and impairments:  Abnormal gait, Decreased balance, Decreased mobility, Decreased safety awareness, Difficulty walking, Postural dysfunction, Impaired tone, Decreased endurance, Decreased strength  Visit Diagnosis: Other symptoms and signs involving the nervous system  Other abnormalities of gait and mobility  Unsteadiness on feet       G-Codes - 12-23-16 1430    Functional Assessment Tool Used gait velocity 2.95 ft/sec, TUG 12.59 sec, TUG manual 12.28 sec, TUG cog 14.87 sec; 5x sit<>stand 16.88 sec; clinical judgement-established HEP and community program   Functional Limitation Mobility: Walking and moving around   Mobility: Walking and Moving Around Goal Status [phone]) At least 20 percent but less than 40 percent impaired, limited or restricted   Mobility: Walking and Moving Around Discharge Status [phone]) At least 20 percent but less than 40 percent impaired, limited or restricted      Problem List Patient Active Problem List   Diagnosis Date Noted  . Muscle weakness of lower extremity 01/17/2015  . Parkinson disease ([place]) 01/17/2015  . BV  (bacterial vaginosis) 02/18/2013  . Fibroid uterus 11/13/2012  . Benign colonic polyp 10/02/2012      Laureen Abrahams, PT, DPT 2016/12/23 2:32 PM    [facility] [address] [place], [place], [address] Phone: [phone]   Fax:  [phone]  Name: [name] [name] MRN: [id number] Date of Birth: April 16, 1951       PHYSICAL THERAPY DISCHARGE SUMMARY  Visits from Start of Care: 19  Current functional level related to goals /  functional outcomes: See above   Remaining deficits: Pt has mild rigidity and gait abnormalities consistent with progressive neurological disorder   Education / Equipment: HEP, community fitness  Plan: Patient agrees to discharge.  Patient goals were met. Patient is being discharged due to meeting the stated rehab goals.  ?????       Laureen Abrahams, PT, DPT 12/09/16 2:33 PM    [facility] [address] [place], [place] [address]  [phone] (office) [phone] (fax)

## 2016-12-09 NOTE — Patient Instructions (Signed)
  SciFit Seated Stepper (this is the one you have been using in the clinic)  Level 2.0 x 8-10 minutes Goal:  Keep Steps per Minute > 40   NuStep Seated Stepper (I believe this is the brand they have at the [facility])  Level 2-3 x 8-10 minutes Goal: Keep Steps per Minute > 40 (you will have to adjust the seat and handles on this-ask for assistance)

## 2016-12-12 ENCOUNTER — Ambulatory Visit: Payer: Medicare Other | Admitting: Occupational Therapy

## 2016-12-12 ENCOUNTER — Ambulatory Visit: Payer: 59 | Admitting: Physical Therapy

## 2016-12-12 DIAGNOSIS — R2681 Unsteadiness on feet: Secondary | ICD-10-CM | POA: Diagnosis not present

## 2016-12-12 DIAGNOSIS — R29818 Other symptoms and signs involving the nervous system: Secondary | ICD-10-CM

## 2016-12-12 DIAGNOSIS — R2689 Other abnormalities of gait and mobility: Secondary | ICD-10-CM

## 2016-12-12 DIAGNOSIS — R293 Abnormal posture: Secondary | ICD-10-CM | POA: Diagnosis not present

## 2016-12-12 DIAGNOSIS — R29898 Other symptoms and signs involving the musculoskeletal system: Secondary | ICD-10-CM | POA: Diagnosis not present

## 2016-12-12 DIAGNOSIS — R278 Other lack of coordination: Secondary | ICD-10-CM

## 2016-12-12 NOTE — Patient Instructions (Signed)
Wear oval 8 splints on your index, middle and ring fingers for improved positioning, if they become too tight or cause pressure areas remove splints and bring to therapy. Make sure to put the right splint on the correct finger, take note of markings inside of splint.

## 2016-12-12 NOTE — Therapy (Signed)
[facility] [address] [place], [place], [address] Phone: [phone]   Fax:  [phone]  Occupational Therapy Treatment  Patient Details  Name: [name] [name] MRN: [id number] Date of Birth: 09-05-1951 Referring Provider: Dr. Jannifer Franklin  Encounter Date: 12/12/2016      OT End of Session - 12/12/16 1548    Visit Number 10   Number of Visits 17   Date for OT Re-Evaluation 12/26/16   Authorization - Visit Number 10   Authorization - Number of Visits 10   OT Start Time 1450   OT Stop Time 1535   OT Time Calculation (min) 45 min   Activity Tolerance Patient tolerated treatment well   Behavior During Therapy [facility] for tasks assessed/performed      Past Medical History:  Diagnosis Date  . Fibroid   . GERD (gastroesophageal reflux disease)   . Muscle weakness of lower extremity 01/17/2015  . Osteopenia   . Parkinson disease ([place]) 01/17/2015    Past Surgical History:  Procedure Laterality Date  . FINGER SURGERY    . MOUTH SURGERY    . TOE SURGERY    . TONSILLECTOMY    . TUBAL LIGATION      There were no vitals filed for this visit.      Subjective Assessment - 12/12/16 1551    Pertinent History see epic   Patient Stated Goals improve independence with ADLS/ IADLs   Currently in Pain? No/denies                    PWR! Basic 4 in standing 10 reps each, min v.c. As warm up and review Arm bike x 5 mins level 1 for conditioning, pt maintained 25-30 rpm Therapist checked standing functional reach and 3 button/ unbutton test for g-code. Reviewed strategies for fastening buttons/ unfastening, min v.c. Grooved pegboard for increased fine motor coordination bilateral UE's, mod difficulty with LUE, min difficulty with RUE.          OT Education - 12/12/16 1546    Education provided Yes   Education Details Oval 8 splint wear, care and prec. for index , middle and ring fingers at PIP joint to  minimize hyperextension   Person(s) Educated Patient   Methods Explanation;Demonstration;Handout   Comprehension Verbalized understanding;Returned demonstration;Verbal cues required          OT Short Term Goals - 11/28/16 1532      OT SHORT TERM GOAL #1   Title I with PD specific HEP.   Time 4   Period Weeks   Status Achieved     OT SHORT TERM GOAL #2   Title Pt will verbalize understanding of adapted strategies for ADLs/ IADLS in order to maximize safety and independence   Time 4   Period Weeks   Status Achieved     OT SHORT TERM GOAL #3   Title Pt will demonstrate improved independnece with ADLS as evidenced by decreasing 3 button/ unbutton to 38 secs or less   Time 4   Period Weeks   Status Achieved  34 secs     OT SHORT TERM GOAL #4   Title Pt will demonstrate ability to retrieve a lightweight object with RUE at 130 shoulder flexion and -10 elbow ext with pain less than or equal to 2/10   Time 4   Period Weeks   Status Achieved  130, with -10 elbow, no pain  OT Long Term Goals - 2016/12/13 1550      OT LONG TERM GOAL #1   Title Pt will verbalize understanding of PD related community resources   Time 8   Period Weeks   Status On-going     OT LONG TERM GOAL #2   Title Pt will improve functional reaching  and decrease fall risk as evidenced by increasing bilateral standing functional reach to 10 inches or greater bilaterally.   Baseline RUE 7.5 inches, RUE 9.5 inches   Time 8   Period Weeks   Status Achieved  RUE 10.5, LUE 10 inches     OT LONG TERM GOAL #3   Title Pt will verbalize understanding of ways to prevent future PD related complications, including ways to reduce shoulder pain and tightness   Time 8   Period Weeks   Status On-going     OT LONG TERM GOAL #4   Title Pt will verbalize understanding of ways to keep thinking skills sharp.   Time 4   Period Weeks   Status Achieved               Plan - 12/13/2016 1553    Clinical  Impression Statement Pt demonstrates improved bilateral standing functional reach. Pt is progressing towards goals. She can benefit from reinforcement of strategies and HEP.   Rehab Potential Good   OT Frequency 2x / week   OT Duration 8 weeks   Plan review all PWR! exercises over 1-2 sessions for reinforcement, reinforce strategies, anticipate d/c in the next 2 weeks.   OT Home Exercise Plan Education provided:  seated PWR (by PT), quadraped PWR, coordination HEP; ADL strategies; PWR! prone (up, rock, twist)   Consulted and Agree with Plan of Care Patient      Patient will benefit from skilled therapeutic intervention in order to improve the following deficits and impairments:  Abnormal gait, Decreased coordination, Decreased range of motion, Difficulty walking, Impaired flexibility, Decreased safety awareness, Decreased endurance, Decreased activity tolerance, Decreased knowledge of precautions, Impaired tone, Decreased balance, Decreased knowledge of use of DME, Impaired UE functional use, Pain, Impaired vision/preception, Impaired perceived functional ability, Decreased strength, Decreased mobility, Decreased cognition  Visit Diagnosis: Other symptoms and signs involving the nervous system  Other abnormalities of gait and mobility  Unsteadiness on feet  Other symptoms and signs involving the musculoskeletal system  Abnormal posture  Other lack of coordination      G-Codes - December 13, 2016 1452    Functional Assessment Tool Used 3 button / unbutton: 51 secs(today, 34 secs-11/28/16,), standing functional reach RUE 10.5 inches, LUE 10 inches   Functional Limitation Self care   Self Care Current Status [id number]) At least 20 percent but less than 40 percent impaired, limited or restricted   Self Care Goal Status [id number]) At least 1 percent but less than 20 percent impaired, limited or restricted     Occupational Therapy Progress Note  Dates of Reporting Period: 10/30/16 to 2016-12-13  Objective  Reports of Subjective Statement: see above  Objective Measurements: standing functional reach, see above  Goal Update: Pt is progressing towards long term goals.   Plan: Anticipate d/c in the next 2 weeks. Pt can benefit from reinforcement of PD specific HEP, oval 8 splint wear for improved PIP joint positioning and reinforcement.  Reason Skilled Services are Required: Pt can benefit from continued skilled occupational therapy to address: bradykinesia, decreased balance, decreased coordination, rigidity , cognitive deficits in order to maximize safety and independence with ADLs/  IADLs and maintain quality of life.  Problem List Patient Active Problem List   Diagnosis Date Noted  . Muscle weakness of lower extremity 01/17/2015  . Parkinson disease ([place]) 01/17/2015  . BV (bacterial vaginosis) 02/18/2013  . Fibroid uterus 11/13/2012  . Benign colonic polyp 10/02/2012    [name] [name] 12/12/2016, 3:55 PM Theone Murdoch, OTR/L [phone] Phone: [phone] 4:11 PM 12/12/16 [facility] [address] [place], [place], [address] Phone: [phone]   Fax:  [phone]  Name: [name] [name] MRN: [id number] Date of Birth: 10-01-1951

## 2016-12-17 ENCOUNTER — Ambulatory Visit: Payer: Medicare Other | Admitting: Occupational Therapy

## 2016-12-17 ENCOUNTER — Encounter: Payer: Self-pay | Admitting: Neurology

## 2016-12-17 ENCOUNTER — Ambulatory Visit (INDEPENDENT_AMBULATORY_CARE_PROVIDER_SITE_OTHER): Payer: Medicare Other | Admitting: Neurology

## 2016-12-17 VITALS — BP 113/71 | HR 84 | Ht 67.0 in | Wt 123.0 lb

## 2016-12-17 DIAGNOSIS — R29898 Other symptoms and signs involving the musculoskeletal system: Secondary | ICD-10-CM

## 2016-12-17 DIAGNOSIS — R293 Abnormal posture: Secondary | ICD-10-CM

## 2016-12-17 DIAGNOSIS — R278 Other lack of coordination: Secondary | ICD-10-CM | POA: Diagnosis not present

## 2016-12-17 DIAGNOSIS — R29818 Other symptoms and signs involving the nervous system: Secondary | ICD-10-CM

## 2016-12-17 DIAGNOSIS — G20 Parkinson's disease: Secondary | ICD-10-CM

## 2016-12-17 DIAGNOSIS — R2681 Unsteadiness on feet: Secondary | ICD-10-CM

## 2016-12-17 DIAGNOSIS — R2689 Other abnormalities of gait and mobility: Secondary | ICD-10-CM

## 2016-12-17 NOTE — Therapy (Signed)
[place] [address] [place], [place], [address] Phone: [phone]   Fax:  [phone]  Occupational Therapy Treatment  Patient Details  Name: [name] [name] MRN: [id number] Date of Birth: [birth year] Referring Provider: Dr. Jannifer Franklin  Encounter Date: 12/17/2016      OT End of Session - 12/17/16 1452    Visit Number 11   Number of Visits 17   Date for OT Re-Evaluation 12/26/16   Authorization - Visit Number 11   Authorization - Number of Visits 20   OT Start Time [id number]   OT Stop Time 1530   OT Time Calculation (min) 42 min   Activity Tolerance Patient tolerated treatment well   Behavior During Therapy [facility] for tasks assessed/performed      Past Medical History:  Diagnosis Date  . Fibroid   . GERD (gastroesophageal reflux disease)   . Muscle weakness of lower extremity 01/17/2015  . Osteopenia   . Parkinson disease ([place]) 01/17/2015    Past Surgical History:  Procedure Laterality Date  . FINGER SURGERY    . MOUTH SURGERY    . TOE SURGERY    . TONSILLECTOMY    . TUBAL LIGATION      There were no vitals filed for this visit.      Subjective Assessment - 12/17/16 1451    Subjective  Pt reports that L elbow feels achy at times and that the oval 8 splints seemed to make the "clicking" worse after a while--Recommended pt bring splints to next session   Pertinent History see epic   Patient Stated Goals improve independence with ADLS/ IADLs   Currently in Pain? No/denies      Neuro re-ed:  PWR! Moves (up, rock, twist) in prone x 10 each with min cues For incr movement amplitude/technique.  PWR! Moves (basic 4 in sitting x 10-20 each with min-mod cues For incr movement amplitude/technique.  Arm bike x33min level 1 for reciprocal movement with cues/target of at least 35rpms for intensity while maintaining movement amplitude/reciprocal movement.   Pt maintained 17-30  rpms.                             OT Education - 12/17/16 1503    Education Details PD-related community resources/upcoming events   Person(s) Educated Patient   Methods Explanation;Handout   Comprehension Verbalized understanding          OT Short Term Goals - 11/28/16 1532      OT SHORT TERM GOAL #1   Title I with PD specific HEP.   Time 4   Period Weeks   Status Achieved     OT SHORT TERM GOAL #2   Title Pt will verbalize understanding of adapted strategies for ADLs/ IADLS in order to maximize safety and independence   Time 4   Period Weeks   Status Achieved     OT SHORT TERM GOAL #3   Title Pt will demonstrate improved independnece with ADLS as evidenced by decreasing 3 button/ unbutton to 38 secs or less   Time 4   Period Weeks   Status Achieved  34 secs     OT SHORT TERM GOAL #4   Title Pt will demonstrate ability to retrieve a lightweight object with RUE at 130 shoulder flexion and -10 elbow ext with pain less than or equal to 2/10   Time 4   Period Weeks   Status Achieved  130,  with -10 elbow, no pain           OT Long Term Goals - 12/18/16 0841      OT LONG TERM GOAL #1   Title Pt will verbalize understanding of PD related community resources   Time 8   Period Weeks   Status Achieved     OT LONG TERM GOAL #2   Title Pt will improve functional reaching  and decrease fall risk as evidenced by increasing bilateral standing functional reach to 10 inches or greater bilaterally.   Baseline RUE 7.5 inches, RUE 9.5 inches   Time 8   Period Weeks   Status Achieved  RUE 10.5, LUE 10 inches     OT LONG TERM GOAL #3   Title Pt will verbalize understanding of ways to prevent future PD related complications, including ways to reduce shoulder pain and tightness   Time 8   Period Weeks   Status On-going     OT LONG TERM GOAL #4   Title Pt will verbalize understanding of ways to keep thinking skills sharp.   Time 4   Period Weeks    Status Achieved               Plan - 12/17/16 1459    Clinical Impression Statement Pt continues to need cueing at times for PWR! moves.  Pt does verbalize understanding of community resources available after education.   Rehab Potential Good   OT Frequency 2x / week   OT Duration 8 weeks   OT Treatment/Interventions Self-care/ADL training;Moist Heat;Fluidtherapy;DME and/or AE instruction;Splinting;Patient/family education;Balance training;Therapeutic exercises;Gait Training;Contrast Bath;Traction;Therapeutic activities;Ultrasound;Therapeutic exercise;Cognitive remediation/compensation;Passive range of motion;Functional Mobility Training;Neuromuscular education;Electrical Stimulation;Parrafin;Energy conservation;Manual Therapy   Plan continue reviewing PWR! exercises (recommend pt bring handouts in next visit), anticipate d/c in next 2-3 visits   OT Home Exercise Plan Education provided:  seated PWR (by PT), quadraped PWR, coordination HEP; ADL strategies; PWR! prone (up, rock, twist)   Consulted and Agree with Plan of Care Patient      Patient will benefit from skilled therapeutic intervention in order to improve the following deficits and impairments:  Abnormal gait, Decreased coordination, Decreased range of motion, Difficulty walking, Impaired flexibility, Decreased safety awareness, Decreased endurance, Decreased activity tolerance, Decreased knowledge of precautions, Impaired tone, Decreased balance, Decreased knowledge of use of DME, Impaired UE functional use, Pain, Impaired vision/preception, Impaired perceived functional ability, Decreased strength, Decreased mobility, Decreased cognition  Visit Diagnosis: Other symptoms and signs involving the nervous system  Other abnormalities of gait and mobility  Unsteadiness on feet  Other symptoms and signs involving the musculoskeletal system  Abnormal posture  Other lack of coordination    Problem List Patient Active  Problem List   Diagnosis Date Noted  . Muscle weakness of lower extremity 01/17/2015  . Parkinson disease ([place]) 01/17/2015  . BV (bacterial vaginosis) 02/18/2013  . Fibroid uterus 11/13/2012  . Benign colonic polyp 10/02/2012    [facility] 12/18/2016, 8:42 AM  [place] [address] [place], [place], [address] Phone: [phone]   Fax:  [phone]  Name: [name] [name] MRN: [id number] Date of Birth: 1951/10/03   Vianne Bulls, OTR/L [facility] [address] [place], [place]  [address] [phone] phone [phone] 12/18/16 8:42 AM

## 2016-12-17 NOTE — Progress Notes (Signed)
Reason for visit: Parkinson's disease  [name] [name] is an 66 y.o. female  History of present illness:  [name] [name] is a 66 year old right-handed black female with a history of Parkinson's disease. The patient has been placed on Mirapex, but she never started the medication. She has been undergoing physical therapy and occupational therapy with some benefit. She has noted some stiffness involving the left arm in the evening hours. She denies any overt pain in the arm. She may have some slight soreness associated with exercise she does during the day. She is not had any falls, she denies any significant issues with swallowing or choking. She returns for an evaluation.  Past Medical History:  Diagnosis Date  . Fibroid   . GERD (gastroesophageal reflux disease)   . Muscle weakness of lower extremity 01/17/2015  . Osteopenia   . Parkinson disease ([place]) 01/17/2015    Past Surgical History:  Procedure Laterality Date  . FINGER SURGERY    . MOUTH SURGERY    . TOE SURGERY    . TONSILLECTOMY    . TUBAL LIGATION      Family History  Problem Relation Age of Onset  . Rheum arthritis Mother   . Breast cancer Mother     88's  . Cancer Father     throat  . Breast cancer Sister     5's  . Hypertension Brother   . Hyperlipidemia Brother   . Heart attack Son     Deceased, 64  . Healthy Daughter   . Parkinsonism Neg Hx     Social history:  reports that she has never smoked. She has never used smokeless tobacco. She reports that she does not drink alcohol or use drugs.    Allergies  Allergen Reactions  . Sulfa Antibiotics Other (See Comments)    Childhood allergy     Medications:  Prior to Admission medications   Medication Sig Start Date End Date Taking? Authorizing Provider  Ascorbic Acid (VITAMIN C PO) Take 1 tablet by mouth daily.   Yes Historical Provider, MD  b complex vitamins capsule Take 1 capsule by mouth daily.   Yes Historical Provider, MD  betamethasone  dipropionate (DIPROLENE) 0.05 % cream Apply topically 2 (two) times daily. 03/16/16  Yes Charlesetta Shanks, MD  calcium-vitamin D (OSCAL WITH D) 250-125 MG-UNIT per tablet Take 1 tablet by mouth daily.   Yes Historical Provider, MD  Cholecalciferol (VITAMIN D) 2000 UNITS CAPS Take 2,000 Units by mouth daily.   Yes Historical Provider, MD  glucosamine-chondroitin 500-400 MG tablet Take 1 tablet by mouth daily.   Yes Historical Provider, MD  ibuprofen (ADVIL,MOTRIN) 200 MG tablet Take 400 mg by mouth every 6 (six) hours as needed for moderate pain.    Yes Historical Provider, MD  Magnesium Hydroxide (MAGNESIA PO) Take 1 tablet by mouth daily.   Yes Historical Provider, MD  Multiple Vitamin (MULTIVITAMIN) capsule Take 1 capsule by mouth daily.   Yes Historical Provider, MD  Omega-3 Fatty Acids (OMEGA 3 PO) Take 1 tablet by mouth daily.   Yes Historical Provider, MD  tetrahydrozoline-zinc (VISINE-AC) 0.05-0.25 % ophthalmic solution Place 2 drops into both eyes 4 (four) times daily as needed (allergies).   Yes Historical Provider, MD  TYROSINE PO Take 1 tablet by mouth daily.   Yes Historical Provider, MD  escitalopram (LEXAPRO) 10 MG tablet Take 1 tablet (10 mg total) by mouth daily. Patient not taking: Reported on 12/17/2016 08/07/16   Kathrynn Ducking, MD  pramipexole (MIRAPEX) 0.125 MG tablet One tablet three times a day for 2 weeks, then take 2 tablets three times a day Patient not taking: Reported on 12/17/2016 08/07/16   Kathrynn Ducking, MD    ROS:  Out of a complete 14 system review of symptoms, the patient complains only of the following symptoms, and all other reviewed systems are negative.  Constipation Joint pain, neck pain, neck stiffness   Blood pressure 113/71, pulse 84, height 5\' 7"  (1.702 m), weight 123 lb (55.8 kg).  Physical Exam  General: The patient is alert and cooperative at the time of the examination.  Skin: No significant peripheral edema is noted.   Neurologic  Exam  Mental status: The patient is alert and oriented x 3 at the time of the examination. The patient has apparent normal recent and remote memory, with an apparently normal attention span and concentration ability.   Cranial nerves: Facial symmetry is present. Speech is normal, no aphasia or dysarthria is noted. Extraocular movements are full. Visual fields are full. Mild masking of the face is seen.  Motor: The patient has good strength in all 4 extremities.  Sensory examination: Soft touch sensation is symmetric on the face, arms, and legs.  Coordination: The patient has good finger-nose-finger and heel-to-shin bilaterally.  Gait and station: The patient has difficulty arising from a seated position with arms crossed. Once up, she is able to and leg independently, slightly decreased arm swing seen on the left. Patient has good turns. Tandem gait is normal. Romberg is negative. No drift is seen.  Reflexes: Deep tendon reflexes are symmetric.   Assessment/Plan.   1. Parkinson's disease  The patient reports some issues with constipation, she will go on MiraLAX for this. The patient has not yet started the Mirapex. She is reporting some stiffness in the left arm in the evening hours, I indicated that starting the Mirapex may help these symptoms. She will follow-up in about 6 months, sooner if needed. The patient will continue to remain active. She is progressing relatively slowly with her Parkinson's disease.  Jill Alexanders MD 12/17/2016 4:08 PM  [facility] [address] [place], [place] [address]  Phone [phone] Fax [phone]

## 2016-12-19 ENCOUNTER — Ambulatory Visit: Payer: Medicare Other | Admitting: Occupational Therapy

## 2016-12-19 DIAGNOSIS — R2689 Other abnormalities of gait and mobility: Secondary | ICD-10-CM

## 2016-12-19 DIAGNOSIS — R293 Abnormal posture: Secondary | ICD-10-CM

## 2016-12-19 DIAGNOSIS — R2681 Unsteadiness on feet: Secondary | ICD-10-CM | POA: Diagnosis not present

## 2016-12-19 DIAGNOSIS — R29818 Other symptoms and signs involving the nervous system: Secondary | ICD-10-CM | POA: Diagnosis not present

## 2016-12-19 DIAGNOSIS — R29898 Other symptoms and signs involving the musculoskeletal system: Secondary | ICD-10-CM | POA: Diagnosis not present

## 2016-12-19 DIAGNOSIS — R278 Other lack of coordination: Secondary | ICD-10-CM | POA: Diagnosis not present

## 2016-12-19 NOTE — Therapy (Signed)
[place] [address] [place], [place], [address] Phone: [phone]   Fax:  [phone]  Occupational Therapy Treatment  Patient Details  Name: [name] [name] MRN: [id number] Date of Birth: [birth year] Referring Provider: Dr. Jannifer Franklin  Encounter Date: 12/19/2016      OT End of Session - 12/19/16 1535    Visit Number 12   Number of Visits 17   Date for OT Re-Evaluation 12/26/16   Authorization - Visit Number 12   Authorization - Number of Visits 20   OT Start Time [id number]   OT Stop Time 1538   OT Time Calculation (min) 49 min   Activity Tolerance Patient tolerated treatment well   Behavior During Therapy [facility] for tasks assessed/performed      Past Medical History:  Diagnosis Date  . Fibroid   . GERD (gastroesophageal reflux disease)   . Muscle weakness of lower extremity 01/17/2015  . Osteopenia   . Parkinson disease ([place]) 01/17/2015    Past Surgical History:  Procedure Laterality Date  . FINGER SURGERY    . MOUTH SURGERY    . TOE SURGERY    . TONSILLECTOMY    . TUBAL LIGATION      There were no vitals filed for this visit.      Subjective Assessment - 12/19/16 1453    Subjective  Pt reports that she got a "good report" at the doctor yesterday   Pertinent History see epic   Patient Stated Goals improve independence with ADLS/ IADLs   Currently in Pain? No/denies        Neuro re-ed:  PWR! Moves (basic 4) in quadraped x 10-20 each with min cues For incr movement amplitude, technique, keeping knees apart.  PWR! Moves (basic 4) in sitting x 10-20 each with min cues For incr movement amplitude, technique, lifting foot for step, and opening UEs fully during twist (particularly RUE).  Arm bike x53min level 1 for reciprocal movement with cues/target of at least 35rpms for intensity while maintaining movement amplitude/reciprocal movement.   Pt maintained 28-33rpms.  Sit>stand from low bench with min  cueing for technique using large amplitude (per pt concerns).  Attempted without UE support, but unable.  Pt able to perform easily with UE support using large amplitude movements.  Reviewed use of PD ex chart for routine.                          OT Short Term Goals - 11/28/16 1532      OT SHORT TERM GOAL #1   Title I with PD specific HEP.   Time 4   Period Weeks   Status Achieved     OT SHORT TERM GOAL #2   Title Pt will verbalize understanding of adapted strategies for ADLs/ IADLS in order to maximize safety and independence   Time 4   Period Weeks   Status Achieved     OT SHORT TERM GOAL #3   Title Pt will demonstrate improved independnece with ADLS as evidenced by decreasing 3 button/ unbutton to 38 secs or less   Time 4   Period Weeks   Status Achieved  34 secs     OT SHORT TERM GOAL #4   Title Pt will demonstrate ability to retrieve a lightweight object with RUE at 130 shoulder flexion and -10 elbow ext with pain less than or equal to 2/10   Time 4   Period Weeks  Status Achieved  130, with -10 elbow, no pain           OT Long Term Goals - 12/18/16 0841      OT LONG TERM GOAL #1   Title Pt will verbalize understanding of PD related community resources   Time 8   Period Weeks   Status Achieved     OT LONG TERM GOAL #2   Title Pt will improve functional reaching  and decrease fall risk as evidenced by increasing bilateral standing functional reach to 10 inches or greater bilaterally.   Baseline RUE 7.5 inches, RUE 9.5 inches   Time 8   Period Weeks   Status Achieved  RUE 10.5, LUE 10 inches     OT LONG TERM GOAL #3   Title Pt will verbalize understanding of ways to prevent future PD related complications, including ways to reduce shoulder pain and tightness   Time 8   Period Weeks   Status On-going     OT LONG TERM GOAL #4   Title Pt will verbalize understanding of ways to keep thinking skills sharp.   Time 4   Period Weeks    Status Achieved               Plan - 12/19/16 1536    Clinical Impression Statement Pt needs min cueing at times for technique for PWR! moves and incr movement amplitude, but is progressing towards goals.   Rehab Potential Good   OT Frequency 2x / week   OT Duration 8 weeks   OT Treatment/Interventions Self-care/ADL training;Moist Heat;Fluidtherapy;DME and/or AE instruction;Splinting;Patient/family education;Balance training;Therapeutic exercises;Gait Training;Contrast Bath;Traction;Therapeutic activities;Ultrasound;Therapeutic exercise;Cognitive remediation/compensation;Passive range of motion;Functional Mobility Training;Neuromuscular education;Electrical Stimulation;Parrafin;Energy conservation;Manual Therapy   Plan continue reviewing PWR! exercises, anticipate d/c in next 1-2 visits   OT Home Exercise Plan Education provided:  seated PWR (by PT), quadraped PWR, coordination HEP; ADL strategies; PWR! prone (up, rock, twist)   Consulted and Agree with Plan of Care Patient      Patient will benefit from skilled therapeutic intervention in order to improve the following deficits and impairments:  Abnormal gait, Decreased coordination, Decreased range of motion, Difficulty walking, Impaired flexibility, Decreased safety awareness, Decreased endurance, Decreased activity tolerance, Decreased knowledge of precautions, Impaired tone, Decreased balance, Decreased knowledge of use of DME, Impaired UE functional use, Pain, Impaired vision/preception, Impaired perceived functional ability, Decreased strength, Decreased mobility, Decreased cognition  Visit Diagnosis: Other symptoms and signs involving the nervous system  Other abnormalities of gait and mobility  Unsteadiness on feet  Other symptoms and signs involving the musculoskeletal system  Abnormal posture  Other lack of coordination    Problem List Patient Active Problem List   Diagnosis Date Noted  . Muscle weakness of lower  extremity 01/17/2015  . Parkinson disease ([place]) 01/17/2015  . BV (bacterial vaginosis) 02/18/2013  . Fibroid uterus 11/13/2012  . Benign colonic polyp 10/02/2012    [facility] 12/19/2016, 3:37 PM  [place] [address] [place], [place], [address] Phone: [phone]   Fax:  [phone]  Name: FARNAZ DERMODY MRN: [id number] Date of Birth: 04/26/1951   Vianne Bulls, OTR/L [facility] [address] [place], [place]  [address] [phone] phone [phone] 12/19/16 3:37 PM

## 2016-12-23 ENCOUNTER — Ambulatory Visit: Payer: Medicare Other | Admitting: Occupational Therapy

## 2016-12-23 DIAGNOSIS — R2681 Unsteadiness on feet: Secondary | ICD-10-CM

## 2016-12-23 DIAGNOSIS — R278 Other lack of coordination: Secondary | ICD-10-CM | POA: Diagnosis not present

## 2016-12-23 DIAGNOSIS — R29818 Other symptoms and signs involving the nervous system: Secondary | ICD-10-CM

## 2016-12-23 DIAGNOSIS — R293 Abnormal posture: Secondary | ICD-10-CM

## 2016-12-23 DIAGNOSIS — R2689 Other abnormalities of gait and mobility: Secondary | ICD-10-CM | POA: Diagnosis not present

## 2016-12-23 DIAGNOSIS — R29898 Other symptoms and signs involving the musculoskeletal system: Secondary | ICD-10-CM

## 2016-12-23 NOTE — Therapy (Signed)
[place] [address] [place], [place], [address] Phone: [phone]   Fax:  [phone]  Occupational Therapy Treatment  Patient Details  Name: RENAYA HOLTHAUS MRN: [id number] Date of Birth: June 22, 1951 Referring Provider: Dr. Jannifer Franklin  Encounter Date: 12/23/2016      OT End of Session - 12/23/16 1525    Visit Number 13   Number of Visits 17   Date for OT Re-Evaluation 12/26/16   Authorization - Visit Number 13   Authorization - Number of Visits 20   OT Start Time [id number]   OT Stop Time 1532   OT Time Calculation (min) 39 min   Activity Tolerance Patient tolerated treatment well   Behavior During Therapy [facility] for tasks assessed/performed      Past Medical History:  Diagnosis Date  . Fibroid   . GERD (gastroesophageal reflux disease)   . Muscle weakness of lower extremity 01/17/2015  . Osteopenia   . Parkinson disease ([place]) 01/17/2015    Past Surgical History:  Procedure Laterality Date  . FINGER SURGERY    . MOUTH SURGERY    . TOE SURGERY    . TONSILLECTOMY    . TUBAL LIGATION      There were no vitals filed for this visit.      Subjective Assessment - 12/23/16 1504    Subjective   "I was a little tired, but I pushed myself"  "I feel so much better after I come to therapy"   Pertinent History see epic   Patient Stated Goals improve independence with ADLS/ IADLs   Currently in Pain? No/denies        Neuro re-ed:  PWR! Moves (basic 4) in supine and standing  x 15-20 each with min cues For incr movement amplitude.  Arm bike x49min level 1 for reciprocal movement with cues/target of at least 40rpms for intensity while maintaining movement amplitude/reciprocal movement.   Pt maintained 26-34rpms.  Flipping cards with each hand with min cues for incr movement amplitude with each hand.                          OT Short Term Goals - 11/28/16 1532      OT SHORT TERM GOAL #1   Title I with PD specific HEP.   Time 4   Period Weeks   Status Achieved     OT SHORT TERM GOAL #2   Title Pt will verbalize understanding of adapted strategies for ADLs/ IADLS in order to maximize safety and independence   Time 4   Period Weeks   Status Achieved     OT SHORT TERM GOAL #3   Title Pt will demonstrate improved independnece with ADLS as evidenced by decreasing 3 button/ unbutton to 38 secs or less   Time 4   Period Weeks   Status Achieved  34 secs     OT SHORT TERM GOAL #4   Title Pt will demonstrate ability to retrieve a lightweight object with RUE at 130 shoulder flexion and -10 elbow ext with pain less than or equal to 2/10   Time 4   Period Weeks   Status Achieved  130, with -10 elbow, no pain           OT Long Term Goals - 12/18/16 0841      OT LONG TERM GOAL #1   Title Pt will verbalize understanding of PD related community resources   Time 8  Period Weeks   Status Achieved     OT LONG TERM GOAL #2   Title Pt will improve functional reaching  and decrease fall risk as evidenced by increasing bilateral standing functional reach to 10 inches or greater bilaterally.   Baseline RUE 7.5 inches, RUE 9.5 inches   Time 8   Period Weeks   Status Achieved  RUE 10.5, LUE 10 inches     OT LONG TERM GOAL #3   Title Pt will verbalize understanding of ways to prevent future PD related complications, including ways to reduce shoulder pain and tightness   Time 8   Period Weeks   Status On-going     OT LONG TERM GOAL #4   Title Pt will verbalize understanding of ways to keep thinking skills sharp.   Time 4   Period Weeks   Status Achieved               Plan - 12/23/16 1525    Clinical Impression Statement Pt demo improved performance for technique for PWR! moves.  Minimal cueing for incr movement amplitude today.     Rehab Potential Good   OT Frequency 2x / week   OT Duration 8 weeks   OT Treatment/Interventions Self-care/ADL training;Moist  Heat;Fluidtherapy;DME and/or AE instruction;Splinting;Patient/family education;Balance training;Therapeutic exercises;Gait Training;Contrast Bath;Traction;Therapeutic activities;Ultrasound;Therapeutic exercise;Cognitive remediation/compensation;Passive range of motion;Functional Mobility Training;Neuromuscular education;Electrical Stimulation;Parrafin;Energy conservation;Manual Therapy   Plan anticipate d/c in next visit, schedule PD re-evals/screens in approx 7 months   OT Home Exercise Plan Education provided:  seated PWR (by PT), quadraped PWR, coordination HEP; ADL strategies; PWR! prone (up, rock, twist)   Consulted and Agree with Plan of Care Patient      Patient will benefit from skilled therapeutic intervention in order to improve the following deficits and impairments:  Abnormal gait, Decreased coordination, Decreased range of motion, Difficulty walking, Impaired flexibility, Decreased safety awareness, Decreased endurance, Decreased activity tolerance, Decreased knowledge of precautions, Impaired tone, Decreased balance, Decreased knowledge of use of DME, Impaired UE functional use, Pain, Impaired vision/preception, Impaired perceived functional ability, Decreased strength, Decreased mobility, Decreased cognition  Visit Diagnosis: Other symptoms and signs involving the nervous system  Other abnormalities of gait and mobility  Unsteadiness on feet  Other symptoms and signs involving the musculoskeletal system  Abnormal posture  Other lack of coordination    Problem List Patient Active Problem List   Diagnosis Date Noted  . Muscle weakness of lower extremity 01/17/2015  . Parkinson disease ([place]) 01/17/2015  . BV (bacterial vaginosis) 02/18/2013  . Fibroid uterus 11/13/2012  . Benign colonic polyp 10/02/2012    [facility] 12/23/2016, 4:37 PM  [place] [address] [place], [place], [address] Phone:  [phone]   Fax:  [phone]  Name: LIESE HILLING MRN: [id number] Date of Birth: 25-Sep-1951   Vianne Bulls, OTR/L [facility] [address] [place], [place]  [address] [phone] phone [phone] 12/23/16 4:37 PM

## 2016-12-25 ENCOUNTER — Ambulatory Visit: Payer: Medicare Other | Admitting: Occupational Therapy

## 2016-12-25 DIAGNOSIS — R293 Abnormal posture: Secondary | ICD-10-CM | POA: Diagnosis not present

## 2016-12-25 DIAGNOSIS — R29898 Other symptoms and signs involving the musculoskeletal system: Secondary | ICD-10-CM | POA: Diagnosis not present

## 2016-12-25 DIAGNOSIS — R2681 Unsteadiness on feet: Secondary | ICD-10-CM | POA: Diagnosis not present

## 2016-12-25 DIAGNOSIS — R29818 Other symptoms and signs involving the nervous system: Secondary | ICD-10-CM

## 2016-12-25 DIAGNOSIS — R2689 Other abnormalities of gait and mobility: Secondary | ICD-10-CM

## 2016-12-25 DIAGNOSIS — R278 Other lack of coordination: Secondary | ICD-10-CM

## 2016-12-25 NOTE — Therapy (Addendum)
[facility] [address] [place], [place], [address] Phone: [phone]   Fax:  [phone]  Occupational Therapy Treatment  Patient Details  Name: [name] [name] MRN: [id number] Date of Birth: [birth year] Referring Provider: Dr. Jannifer Franklin  Encounter Date: 12/25/2016      OT End of Session - 12/25/16 1811    Visit Number 14   Number of Visits 17   Date for OT Re-Evaluation 12/26/16   Authorization Type Medicare   Authorization - Visit Number 14   Authorization - Number of Visits 20   OT Start Time 1450   OT Stop Time 1530   OT Time Calculation (min) 40 min   Activity Tolerance Patient tolerated treatment well   Behavior During Therapy [facility] for tasks assessed/performed      Past Medical History:  Diagnosis Date  . Fibroid   . GERD (gastroesophageal reflux disease)   . Muscle weakness of lower extremity 01/17/2015  . Osteopenia   . Parkinson disease ([place]) 01/17/2015    Past Surgical History:  Procedure Laterality Date  . FINGER SURGERY    . MOUTH SURGERY    . TOE SURGERY    . TONSILLECTOMY    . TUBAL LIGATION      There were no vitals filed for this visit.      Subjective Assessment - 12/25/16 1453    Subjective  Pt reports she is ready for d/c today   Pertinent History see epic   Patient Stated Goals improve independence with ADLS/ IADLs   Currently in Pain? No/denies          Arm bike x 6 mins level 1 for conditioning, pt maintained 25-30 rpm. Tossing scarves to targets  With bilateral UE's for dynamic step and reach, min v.c. For larger amplitude movements. Therapist checked progress towards goals.                    OT Education - 12/25/16 1811    Education provided Yes   Education Details P[employer]! hands, ways to prevent future PD- related complications.   Person(s) Educated Patient   Methods Explanation;Demonstration;Verbal cues   Comprehension Verbalized  understanding;Returned demonstration          OT Short Term Goals - 11/28/16 1532      OT SHORT TERM GOAL #1   Title I with PD specific HEP.   Time 4   Period Weeks   Status Achieved     OT SHORT TERM GOAL #2   Title Pt will verbalize understanding of adapted strategies for ADLs/ IADLS in order to maximize safety and independence   Time 4   Period Weeks   Status Achieved     OT SHORT TERM GOAL #3   Title Pt will demonstrate improved independnece with ADLS as evidenced by decreasing 3 button/ unbutton to 38 secs or less   Time 4   Period Weeks   Status Achieved  34 secs     OT SHORT TERM GOAL #4   Title Pt will demonstrate ability to retrieve a lightweight object with RUE at 130 shoulder flexion and -10 elbow ext with pain less than or equal to 2/10   Time 4   Period Weeks   Status Achieved  130, with -10 elbow, no pain           OT Long Term Goals - 12/25/16 1810      OT LONG TERM GOAL #1   Title Pt will verbalize  understanding of PD related community resources   Time 8   Period Weeks   Status Achieved     OT LONG TERM GOAL #2   Title Pt will improve functional reaching  and decrease fall risk as evidenced by increasing bilateral standing functional reach to 10 inches or greater bilaterally.   Baseline RUE 7.5 inches, RUE 9.5 inches   Time 8   Period Weeks   Status Achieved  RUE 10.5, LUE 10 inches     OT LONG TERM GOAL #3   Title Pt will verbalize understanding of ways to prevent future PD related complications, including ways to reduce shoulder pain and tightness   Time 8   Period Weeks   Status Achieved     OT LONG TERM GOAL #4   Title Pt will verbalize understanding of ways to keep thinking skills sharp.   Time 4   Period Weeks   Status Achieved               Plan - 27-Dec-2016 1454    Clinical Impression Statement Pt demonstrates excellent overall progress towards goals. She agrees with with plans for d/c today.   Rehab Potential Good    OT Frequency 2x / week   OT Duration 8 weeks   OT Treatment/Interventions Self-care/ADL training;Moist Heat;Fluidtherapy;DME and/or AE instruction;Splinting;Patient/family education;Balance training;Therapeutic exercises;Gait Training;Contrast Bath;Traction;Therapeutic activities;Ultrasound;Therapeutic exercise;Cognitive remediation/compensation;Passive range of motion;Functional Mobility Training;Neuromuscular education;Electrical Stimulation;Parrafin;Energy conservation;Manual Therapy   Plan discharge from occupational therapy, schedule PD evals for 7 mons    .  Patient will benefit from skilled therapeutic intervention in order to improve the following deficits and impairments:  Abnormal gait, Decreased coordination, Decreased range of motion, Difficulty walking, Impaired flexibility, Decreased safety awareness, Decreased endurance, Decreased activity tolerance, Decreased knowledge of precautions, Impaired tone, Decreased balance, Decreased knowledge of use of DME, Impaired UE functional use, Pain, Impaired vision/preception, Impaired perceived functional ability, Decreased strength, Decreased mobility, Decreased cognition  Visit Diagnosis: Other symptoms and signs involving the nervous system  Other abnormalities of gait and mobility  Other symptoms and signs involving the musculoskeletal system  Abnormal posture  Other lack of coordination  Unsteadiness on feet      G-Codes - 12/27/2016 1822    Functional Assessment Tool Used (Outpatient only) 3 button / unbutton: 51 secs, 34 secs-11/28/16,), standing functional reach RUE 10.5 inches, LUE 10 inches   Functional Limitation Self care   Self Care Goal Status (K3491) At least 1 percent but less than 20 percent impaired, limited or restricted   Self Care Discharge Status [id number]) At least 20 percent but less than 40 percent impaired, limited or restricted     OCCUPATIONAL THERAPY DISCHARGE SUMMARY    Current functional level related to  goals / functional outcomes: Pt made excellent overall progress, see above.   Remaining deficits: Bradykinesia, rigidity, decreased balance, abnormal posture,    Education / Equipment: Pt was educated regarding PD specific HEP, community resources, adpted strategies for ADLs and ways to prevent future PD related complications. Pt verbalized understanding of all education. She can benefit from a muliti disciplinary PD evaluations in 7 mons due to the progressive  nature of PD. Plan: Patient agrees to discharge.  Patient goals were partially met. Patient is being discharged due to meeting the stated rehab goals.  ?????     Problem List Patient Active Problem List   Diagnosis Date Noted  . Muscle weakness of lower extremity 01/17/2015  . Parkinson disease ([place]) 01/17/2015  . BV (bacterial vaginosis)  02/18/2013  . Fibroid uterus 11/13/2012  . Benign colonic polyp 10/02/2012    Alessandria Henken 12/25/2016, 6:24 PM Theone Murdoch, OTR/L [phone] Phone: [phone] 12:56 PM 12/27/16 [facility] [address] [place], [place], [address] Phone: [phone]   Fax:  [phone]  Name: JOCELYNNE DUQUETTE MRN: [id number] Date of Birth: 04-Jul-1951

## 2016-12-31 ENCOUNTER — Encounter: Payer: 59 | Admitting: Occupational Therapy

## 2016-12-31 ENCOUNTER — Ambulatory Visit: Payer: Medicare Other | Attending: Neurology | Admitting: Speech Pathology

## 2016-12-31 DIAGNOSIS — R471 Dysarthria and anarthria: Secondary | ICD-10-CM | POA: Diagnosis not present

## 2016-12-31 NOTE — Patient Instructions (Signed)
  Big breath to generate good volume and power you voice  Big breath - loud "AH!" 5 times twice a day with big effort 6/10  Read aloud, focusing on volume - breathe a periods or pauses - use big expression  Read the grands books with lots of expression and volume  Look at the person you are talking to  [facility] the TV, be in close proximity to who you are talking to, step away from loud running appliances

## 2016-12-31 NOTE — Therapy (Signed)
[name] [address] [place], [place], [address] Phone: [phone]   Fax:  [phone]  Speech Language Pathology Evaluation  Patient Details  Name: [name] [name] MRN: [id number] Date of Birth: 28-Oct-1951 Referring Provider: Dr. Kathrynn Ducking  Encounter Date: 12/31/2016      End of Session - 12/31/16 1458    Visit Number 1   Number of Visits 17   Date for SLP Re-Evaluation 02/28/17   SLP Start Time 1404   SLP Stop Time  1445   SLP Time Calculation (min) 41 min   Activity Tolerance Patient tolerated treatment well      Past Medical History:  Diagnosis Date  . Fibroid   . GERD (gastroesophageal reflux disease)   . Muscle weakness of lower extremity 01/17/2015  . Osteopenia   . Parkinson disease ([place]) 01/17/2015    Past Surgical History:  Procedure Laterality Date  . FINGER SURGERY    . MOUTH SURGERY    . TOE SURGERY    . TONSILLECTOMY    . TUBAL LIGATION      There were no vitals filed for this visit.      Subjective Assessment - 12/31/16 1416    Subjective "My family has to lean in to hear me"            SLP Evaluation [facility] - 12/31/16 1417      SLP Visit Information   SLP Received On 12/31/16   Referring Provider Dr. Kathrynn Ducking   Onset Date 2016 PD dx   Medical Diagnosis Parkinson's Disease     Subjective   Patient/Family Stated Goal "that I cann improve the loudness of my speech impede the progress of Parkinson's"     Pain Assessment   Currently in Pain? No/denies     General Information   HPI [name] [name] is a 66 year old right-handed black female with a history of Parkinson's disease. The patient has been placed on Mirapex, but she never started the medication. She has been undergoing physical therapy and occupational therapy with some benefit. She has noted some stiffness involving the left arm in the evening hours. She denies any overt pain in the arm. She may have some  slight soreness associated with exercise she does during the day. She is not had any falls, she denies any significant issues with swallowing or choking   Mobility Status walks independently denies falls     Prior Functional Status   Cognitive/Linguistic Baseline Within functional limits   Type of Home Apartment    Lives With Alone   Available Support Family   Vocation Retired     Oral Motor/Sensory Function   Overall Oral Motor/Sensory Function Impaired   Labial ROM Within Functional Limits   Labial Symmetry Within Functional Limits   Labial Strength Within Functional Limits   Labial Sensation Within Functional Limits   Labial Coordination WFL   Lingual ROM Within Functional Limits   Lingual Symmetry Within Functional Limits   Lingual Coordination Reduced   Velum Within Functional Limits     Motor Speech   Overall Motor Speech Impaired   Respiration Within functional limits   Phonation Low vocal intensity   Resonance Within functional limits   Intelligibility Intelligibility reduced   Word 75-100% accurate   Phrase 75-100% accurate   Sentence 75-100% accurate   Conversation 75-100% accurate   Motor Planning Witnin functional limits   Effective Techniques Increased vocal intensity  SLP Education - 12/31/16 1458    Education provided Yes   Education Details Breath support for volume, loud "AH", use expression when reading aloud   Person(s) Educated Patient   Methods Explanation;Demonstration;Verbal cues   Comprehension Verbalized understanding;Returned demonstration;Verbal cues required;Need further instruction          SLP Short Term Goals - 12/31/16 1502      SLP SHORT TERM GOAL #1   Title Pt will average loud "AH!" of 90 dB over 3 sessions with  rare min A   Time 4   Period Weeks   Status New     SLP SHORT TERM GOAL #2   Title Pt will average 72dB on structured speech tasks with rare min A over 2 sessions   Time 4    Period Weeks   Status New     SLP SHORT TERM GOAL #3   Title Pt will maintain average of 70dB during 8 minute simple conversation over 2 sessions.    Time 4   Period Weeks   Status New          SLP Long Term Goals - 12/31/16 1504      SLP LONG TERM GOAL #1   Title Pt will average loud "AH!" of 90dB over 4 sessions with mod I   Time 8   Period Weeks   Status New     SLP LONG TERM GOAL #2   Title Pt will maintain average of 70dB over 15 minute conversation with rare min A over 2 sessions   Time 8   Period Weeks   Status New     SLP LONG TERM GOAL #3   Title Pt will maintain audible volume over 12 minute conversation in noisy environment with rare min A over 2 sessions   Time [place] - 12/31/16 1459    Clinical Impression Statement [name] [name], a 66 y.o. female was dx with PD 2016. This is her first course of ST. She states that her children and grandchildren are having a hard time hearing her and that they "lean in to try and hear me." She is motivated to improve and maintain her volume and expression. She denies difficulty swallowing or cognitive changes.   Oral motor assessment revealed WFL lingual ROM and WFL lingual strength. Labial ROM wasWFL* and strength was [facility]. Velar ROM appeared [facility]. Lingual movement tremulous.   Measured when a sound level meter was placed 30 cm away from pt's mouth, 9 minutes of conversational speech was reduced today, at average 67dB (WNL= average 70-72dB) with range of 65 to 69dB. Overall speech intelligibility for this listener in a quiet environment was not affected, at approx 100%. Production of loud /a/ averaged 85 dB (range of 76 to 95) and usual moderate cues  needed for loudness and breath support.   Pt then rated effort level at 6/10 for production of loud /a/ (10=maximal effort). In oral reading and structured speech tasks pt was asked to use the same amount of effort as with loud /a/. Loudness  average with this increased effort was 72dB (range of 70 to 74) with occasional minimal A for loudness. Pt would benefit from skilled ST in order to improve speech intelligibility and pt's QOL.    Speech Therapy Frequency 2x / week   Duration --  8 weeks, total of 17 visits   Treatment/Interventions Compensatory strategies;Functional tasks;Patient/family education;SLP  instruction and feedback;Internal/external aids;Environmental controls   Potential to Achieve Goals Good   SLP Home Exercise Plan Loud "ah" see pt instructions   Consulted and Agree with Plan of Care Patient      Patient will benefit from skilled therapeutic intervention in order to improve the following deficits and impairments:   Dysarthria and anarthria - Plan: SLP plan of care cert/re-cert      G-Codes - [id number]    Functional Assessment Tool Used NOMS   Functional Limitations Motor speech   Motor Speech Current Status [phone]) At least 20 percent but less than 40 percent impaired, limited or restricted   Motor Speech Goal Status [id number]) At least 1 percent but less than 20 percent impaired, limited or restricted      Problem List Patient Active Problem List   Diagnosis Date Noted  . Muscle weakness of lower extremity 01/17/2015  . Parkinson disease ([place]) 01/17/2015  . BV (bacterial vaginosis) 02/18/2013  . Fibroid uterus 11/13/2012  . Benign colonic polyp 10/02/2012    Naftula Donahue, Annye Rusk MS, CCC-SLP 12/31/2016, 3:14 PM  [facility] [address] [place], [place], [address] Phone: [phone]   Fax:  [phone]  Name: [name] [name] MRN: [id number] Date of Birth: [birth year]

## 2017-01-09 ENCOUNTER — Ambulatory Visit: Payer: Medicare Other | Admitting: Speech Pathology

## 2017-01-14 ENCOUNTER — Ambulatory Visit: Payer: Medicare Other | Admitting: *Deleted

## 2017-01-23 ENCOUNTER — Ambulatory Visit: Payer: Medicare Other | Admitting: Speech Pathology

## 2017-01-23 DIAGNOSIS — R471 Dysarthria and anarthria: Secondary | ICD-10-CM

## 2017-01-23 NOTE — Patient Instructions (Signed)
Home Exercises  1. LOUD "ahhh" 5x , twice a day. 2. Read word list, LOUD when you practice "ahhh" 3.

## 2017-01-23 NOTE — Therapy (Signed)
[facility] [address] [place], [place], [address] Phone: [phone]   Fax:  [phone]  Speech Language Pathology Treatment  Patient Details  Name: [name] [name] MRN: [id number] Date of Birth: May 02, 1951 Referring Provider: Dr. Kathrynn Ducking  Encounter Date: 01/23/2017      End of Session - 01/23/17 1735    Visit Number 2   Number of Visits 17   Date for SLP Re-Evaluation 02/28/17   SLP Start Time 1532   SLP Stop Time  4580   SLP Time Calculation (min) 43 min   Activity Tolerance Patient tolerated treatment well      Past Medical History:  Diagnosis Date  . Fibroid   . GERD (gastroesophageal reflux disease)   . Muscle weakness of lower extremity 01/17/2015  . Osteopenia   . Parkinson disease ([place]) 01/17/2015    Past Surgical History:  Procedure Laterality Date  . FINGER SURGERY    . MOUTH SURGERY    . TOE SURGERY    . TONSILLECTOMY    . TUBAL LIGATION      There were no vitals filed for this visit.      Subjective Assessment - 01/23/17 1539    Subjective "My own voice was kind of low, and if it was a busy room people had to lean in to hear me."   Currently in Pain? No/denies               ADULT SLP TREATMENT - 01/23/17 1530      General Information   Behavior/Cognition Alert;Cooperative;Pleasant mood   Patient Positioning Upright in chair   Oral care provided N/A     Treatment Provided   Treatment provided Cognitive-Linquistic     Pain Assessment   Pain Assessment No/denies pain     Cognitive-Linquistic Treatment   Treatment focused on Voice;Dysarthria   Skilled Treatment Provided education regarding results of evaluation, goals of care, and treatment plan. Written and verbal instruction in home exercise program, including 5 loud /a/ 2x per day, functional phrases. Demonstrated loud /a/; pt performed at average 90.3 dB, pt benefitted from moderate verbal cues and  demonstration to improve breath support, vocal quality, mouth opening and loudness. Word level tasks 73.9 dB avg, 73.8 avg with increased cognitive load. Phrase level tasks with 79 dB avg and min cues. Home tasks assigned.     Assessment / Recommendations / Plan   Plan Continue with current plan of care     Progression Toward Goals   Progression toward goals Progressing toward goals          SLP Education - 01/23/17 1734    Education provided Yes   Education Details impact of sensory perceptions on vocal amplitude, must use increased effort to be loud enough   Person(s) Educated Patient   Methods Explanation;Demonstration;Verbal cues   Comprehension Verbalized understanding;Returned demonstration          SLP Short Term Goals - 01/23/17 1737      SLP SHORT TERM GOAL #1   Title Pt will average loud "AH!" of 90 dB over 3 sessions with  rare min A   Time 4   Period Weeks   Status On-going     SLP SHORT TERM GOAL #2   Title Pt will average 72dB on structured speech tasks with rare min A over 2 sessions   Time 4   Period Weeks   Status On-going     SLP SHORT TERM GOAL #3  Title Pt will maintain average of 70dB during 8 minute simple conversation over 2 sessions.    Time 4   Period Weeks   Status On-going          SLP Long Term Goals - 01/23/17 1738      SLP LONG TERM GOAL #1   Title Pt will average loud "AH!" of 90dB over 4 sessions with mod I   Time 8   Period Weeks   Status On-going     SLP LONG TERM GOAL #2   Title Pt will maintain average of 70dB over 15 minute conversation with rare min A over 2 sessions   Time 8   Period Weeks   Status On-going     SLP LONG TERM GOAL #3   Title Pt will maintain audible volume over 12 minute conversation in noisy environment with rare min A over 2 sessions   Time 8   Period Weeks   Status On-going          Plan - 01/23/17 1735    Clinical Impression Statement [name] [name] demonstrates emerging awareness of her  reduced vocal amplitude and made some attempts to self-monitor and self-correct during today's session. She is highly stimulable for techniques to improve breath support, vocal quality and amplitude. Recommend skilled ST to maximize communication function and intelligibility.    Speech Therapy Frequency 2x / week   Duration Other (comment)   Treatment/Interventions Compensatory strategies;Functional tasks;Patient/family education;SLP instruction and feedback;Internal/external aids;Environmental controls   Potential to Achieve Goals Good   SLP Home Exercise Plan Loud "ah" , functional phrases , see pt instructions   Consulted and Agree with Plan of Care Patient      Patient will benefit from skilled therapeutic intervention in order to improve the following deficits and impairments:   Dysarthria and anarthria    Problem List Patient Active Problem List   Diagnosis Date Noted  . Muscle weakness of lower extremity 01/17/2015  . Parkinson disease ([place]) 01/17/2015  . BV (bacterial vaginosis) 02/18/2013  . Fibroid uterus 11/13/2012  . Benign colonic polyp 10/02/2012   Deneise Lever, MS CF-SLP Speech-Language Pathologist  Aliene Altes 01/23/2017, 5:39 PM  [facility] [address] [place], [place], [address] Phone: [phone]   Fax:  [phone]   Name: [name] [name] MRN: [id number] Date of Birth: [birth year]

## 2017-01-30 ENCOUNTER — Ambulatory Visit: Payer: Medicare Other | Attending: Neurology | Admitting: Speech Pathology

## 2017-01-30 DIAGNOSIS — R471 Dysarthria and anarthria: Secondary | ICD-10-CM

## 2017-01-30 NOTE — Therapy (Signed)
[facility] [address] [place], [place], [address] Phone: [phone]   Fax:  [phone]  Speech Language Pathology Treatment  Patient Details  Name: [name] [name] MRN: [id number] Date of Birth: [birth year] Referring Provider: Dr. Kathrynn Ducking  Encounter Date: 01/30/2017      End of Session - 01/30/17 1613    Visit Number 3   Number of Visits 17   Date for SLP Re-Evaluation 02/28/17   SLP Start Time 1545   SLP Stop Time  1620   SLP Time Calculation (min) 35 min   Activity Tolerance Patient tolerated treatment well      Past Medical History:  Diagnosis Date  . Fibroid   . GERD (gastroesophageal reflux disease)   . Muscle weakness of lower extremity 01/17/2015  . Osteopenia   . Parkinson disease ([place]) 01/17/2015    Past Surgical History:  Procedure Laterality Date  . FINGER SURGERY    . MOUTH SURGERY    . TOE SURGERY    . TONSILLECTOMY    . TUBAL LIGATION      There were no vitals filed for this visit.      Subjective Assessment - 01/30/17 1547    Subjective "I told my kids when my voice gets low to tell me to speak up."   Currently in Pain? No/denies               ADULT SLP TREATMENT - 01/30/17 1548      General Information   Behavior/Cognition Alert;Cooperative;Pleasant mood   Patient Positioning Upright in chair   Oral care provided N/A     Treatment Provided   Treatment provided Cognitive-Linquistic     Pain Assessment   Pain Assessment No/denies pain     Cognitive-Linquistic Treatment   Treatment focused on Voice;Dysarthria   Skilled Treatment SLP provided demonstration of loud /a/ with abdominal breathing techniques; pt performed at average 90.5 dB, pt benefitted from moderate verbal cues and demonstration to improve breath support, vocal quality, mouth opening and loudness. Word level tasks 78.9 dB avg, Phrases 75.1 avg with increased cognitive load and rare min A.  Sentence level tasks with 77.1 dB avg and min cues. Home tasks assigned.     Assessment / Recommendations / Plan   Plan Continue with current plan of care     Progression Toward Goals   Progression toward goals Progressing toward goals          SLP Education - 01/30/17 1613    Education provided Yes   Education Details Carryover of loudness between speech tasks   Person(s) Educated Patient   Methods Explanation;Demonstration;Verbal cues   Comprehension Verbalized understanding;Returned demonstration          SLP Short Term Goals - 01/30/17 1615      SLP SHORT TERM GOAL #1   Title Pt will average loud "AH!" of 90 dB over 3 sessions with  rare min A   Time 3   Period Weeks   Status On-going     SLP SHORT TERM GOAL #2   Title Pt will average 72dB on structured speech tasks with rare min A over 2 sessions   Time 3   Period Weeks   Status On-going     SLP SHORT TERM GOAL #3   Title Pt will maintain average of 70dB during 8 minute simple conversation over 2 sessions.    Time 3   Period Weeks  SLP Long Term Goals - 01/30/17 1616      SLP LONG TERM GOAL #1   Title Pt will average loud "AH!" of 90dB over 4 sessions with mod I   Time 7   Period Weeks   Status On-going     SLP LONG TERM GOAL #2   Title Pt will maintain average of 70dB over 15 minute conversation with rare min A over 2 sessions   Time 7   Period Weeks   Status On-going     SLP LONG TERM GOAL #3   Title Pt will maintain audible volume over 12 minute conversation in noisy environment with rare min A over 2 sessions   Time 7   Period Weeks   Status On-going          Plan - 01/30/17 1614    Clinical Impression Statement [name] [name] demonstrates emerging awareness of her reduced vocal amplitude and made some attempts to self-monitor and self-correct during today's session. She is highly stimulable for techniques to improve breath support, vocal quality and amplitude. Recommend skilled ST  to maximize communication function and intelligibility.    Speech Therapy Frequency 2x / week   Duration Other (comment)   Treatment/Interventions Compensatory strategies;Functional tasks;Patient/family education;SLP instruction and feedback;Internal/external aids;Environmental controls   Potential to [place] "ah" , functional phrases   Consulted and Agree with Plan of Care Patient      Patient will benefit from skilled therapeutic intervention in order to improve the following deficits and impairments:   Dysarthria and anarthria    Problem List Patient Active Problem List   Diagnosis Date Noted  . Muscle weakness of lower extremity 01/17/2015  . Parkinson disease ([place]) 01/17/2015  . BV (bacterial vaginosis) 02/18/2013  . Fibroid uterus 11/13/2012  . Benign colonic polyp 10/02/2012   Deneise Lever, MS CF-SLP Speech-Language Pathologist  Aliene Altes 01/30/2017, 4:22 PM  [facility] [address] [place], [place], [address] Phone: [phone]   Fax:  [phone]   Name: [name] [name] MRN: [id number] Date of Birth: 11-27-1950

## 2017-02-03 ENCOUNTER — Ambulatory Visit: Payer: Medicare Other | Admitting: Speech Pathology

## 2017-02-03 DIAGNOSIS — R471 Dysarthria and anarthria: Secondary | ICD-10-CM | POA: Diagnosis not present

## 2017-02-03 NOTE — Therapy (Signed)
[facility] [address] [place], [place], [address] Phone: [phone]   Fax:  [phone]  Speech Language Pathology Treatment  Patient Details  Name: [name] [name] MRN: [id number] Date of Birth: 11-29-1950 Referring Provider: Dr. Kathrynn Ducking  Encounter Date: 02/03/2017      End of Session - 02/03/17 1517    Visit Number 4   Number of Visits 17   Date for SLP Re-Evaluation 02/28/17   SLP Start Time 1446   SLP Stop Time  1530   SLP Time Calculation (min) 44 min   Activity Tolerance Patient tolerated treatment well      Past Medical History:  Diagnosis Date  . Fibroid   . GERD (gastroesophageal reflux disease)   . Muscle weakness of lower extremity 01/17/2015  . Osteopenia   . Parkinson disease ([place]) 01/17/2015    Past Surgical History:  Procedure Laterality Date  . FINGER SURGERY    . MOUTH SURGERY    . TOE SURGERY    . TONSILLECTOMY    . TUBAL LIGATION      There were no vitals filed for this visit.      Subjective Assessment - 02/03/17 1452    Subjective "I need to speak loud." Pt calls from therapy room to tell her friend what time to pick her up.   Currently in Pain? No/denies               ADULT SLP TREATMENT - 02/03/17 1452      General Information   Behavior/Cognition Alert;Cooperative;Pleasant mood   Patient Positioning Upright in chair   Oral care provided N/A     Treatment Provided   Treatment provided Cognitive-Linquistic     Pain Assessment   Pain Assessment No/denies pain     Cognitive-Linquistic Treatment   Treatment focused on Voice;Dysarthria   Skilled Treatment SLP provided demonstration of loud /a/ with abdominal breathing techniques; pt performed at average 90.4 dB, pt benefitted from moderate verbal cues and demonstration to improve breath support, vocal quality, mouth opening and loudness. Phrases 76.7 avg. Independent with simple sentence level tasks with  78.5 dB avg. 73.3avg for sentences with increased cognitive load and occasional min A. Paragraph level 71.4 with rare min A. Spontaneous responses 65.7. Structured conversation 5 min avg: 70.8. Home tasks assigned.     Assessment / Recommendations / Plan   Plan Continue with current plan of care     Progression Toward Goals   Progression toward goals Progressing toward goals          SLP Education - 02/03/17 1517    Education provided Yes   Education Details effort level   Person(s) Educated Patient   Methods Explanation   Comprehension Verbalized understanding          SLP Short Term Goals - 02/03/17 1518      SLP SHORT TERM GOAL #1   Title Pt will average loud "AH!" of 90 dB over 3 sessions with  rare min A   Time 3   Period Weeks   Status On-going     SLP SHORT TERM GOAL #2   Title Pt will average 72dB on structured sentence level speech tasks with rare min A over 2 sessions   Baseline 02/03/17   Time 3   Status Revised     SLP SHORT TERM GOAL #3   Title Pt will maintain average of 70dB during 8 minute simple conversation over 2 sessions.  Time 3   Period Weeks   Status On-going          SLP Long Term Goals - 02/03/17 1533      SLP LONG TERM GOAL #1   Title Pt will average loud "AH!" of 90dB over 4 sessions with mod I   Time 7   Period Weeks   Status On-going     SLP LONG TERM GOAL #2   Title Pt will maintain average of 70dB over 15 minute conversation with rare min A over 2 sessions   Time 7   Period Weeks   Status On-going     SLP LONG TERM GOAL #3   Title Pt will maintain audible volume over 12 minute conversation in noisy environment with rare min A over 2 sessions   Time 7   Period Weeks   Status On-going          Plan - 02/03/17 1518    Clinical Impression Statement Mrs. Bayless demonstrates emerging awareness of her reduced vocal amplitude and made some attempts to self-monitor and self-correct during today's session. She is highly  stimulable for techniques to improve breath support, vocal quality and amplitude. Recommend skilled ST to maximize communication function and intelligibility.    Speech Therapy Frequency 2x / week   Duration Other (comment)   Treatment/Interventions Compensatory strategies;Functional tasks;Patient/family education;SLP instruction and feedback;Internal/external aids;Environmental controls   Potential to [place] "ah" , functional phrases   Consulted and Agree with Plan of Care Patient      Patient will benefit from skilled therapeutic intervention in order to improve the following deficits and impairments:   Dysarthria and anarthria    Problem List Patient Active Problem List   Diagnosis Date Noted  . Muscle weakness of lower extremity 01/17/2015  . Parkinson disease ([place]) 01/17/2015  . BV (bacterial vaginosis) 02/18/2013  . Fibroid uterus 11/13/2012  . Benign colonic polyp 10/02/2012   [name] Lever, MS CF-SLP Speech-Language Pathologist   Aliene Altes 02/03/2017, 3:34 PM  [facility] [address] [place], [place], [address] Phone: [phone]   Fax:  [phone]   Name: [name] [name] MRN: [id number] Date of Birth: [birth year]

## 2017-02-06 ENCOUNTER — Ambulatory Visit: Payer: Medicare Other | Admitting: Speech Pathology

## 2017-02-06 DIAGNOSIS — R471 Dysarthria and anarthria: Secondary | ICD-10-CM

## 2017-02-06 NOTE — Patient Instructions (Signed)
  We generate volume by talking on a big breath   When someone indicates that they can't hear you, take a big breath and think shout  It's OK to feel like you are talking too loud - that means that you are making good effort to have volume   Tips to help others understand you:  Get the persons attention before you speak  Use eye contact and face the person you are speaking to  Be in close proximity to the person you are speaking to  Turn down any noise in the environment such as the TV, walk away from loud appliances, air conditioners, fans, sink running, dish washers etc

## 2017-02-06 NOTE — Therapy (Signed)
[facility] [address] [place], [place], [address] Phone: [phone]   Fax:  [phone]  Speech Language Pathology Treatment  Patient Details  Name: [name] [name] MRN: [id number] Date of Birth: 19-Oct-1951 Referring Provider: Dr. Kathrynn Ducking  Encounter Date: 02/06/2017      End of Session - 02/06/17 1337    Visit Number 5   Number of Visits 17   Date for SLP Re-Evaluation 02/28/17   SLP Start Time 1112   SLP Stop Time  7622   SLP Time Calculation (min) 35 min      Past Medical History:  Diagnosis Date  . Fibroid   . GERD (gastroesophageal reflux disease)   . Muscle weakness of lower extremity 01/17/2015  . Osteopenia   . Parkinson disease ([place]) 01/17/2015    Past Surgical History:  Procedure Laterality Date  . FINGER SURGERY    . MOUTH SURGERY    . TOE SURGERY    . TONSILLECTOMY    . TUBAL LIGATION      There were no vitals filed for this visit.      Subjective Assessment - 02/06/17 1114    Subjective Pt arrived 10 minutes late to [facility] "I need to do my exercises in the morning and have my medications kick in"   Currently in Pain? No/denies               ADULT SLP TREATMENT - 02/06/17 1115      General Information   Behavior/Cognition Alert;Cooperative;Pleasant mood     Treatment Provided   Treatment provided Cognitive-Linquistic     Pain Assessment   Pain Assessment No/denies pain     Cognitive-Linquistic Treatment   Treatment focused on Voice;Dysarthria   Skilled Treatment Pt reports her family has told her "Mama, I can hear you now." Loud /a/ with abdominal breathing average 93dB with mod I. Structured speech tasks generating sentences with multsyllabic words with average low 70's     Assessment / Recommendations / Plan   Plan Continue with current plan of care            SLP Short Term Goals - 02/06/17 1335      SLP SHORT TERM GOAL #1   Title Pt will average  loud "AH!" of 90 dB over 3 sessions with  rare min A   Time 3   Period Weeks   Status On-going     SLP SHORT TERM GOAL #2   Title Pt will average 72dB on structured sentence level speech tasks with rare min A over 2 sessions   Baseline 02/03/17, 02/06/17   Time 3   Status Achieved     SLP SHORT TERM GOAL #3   Title Pt will maintain average of 70dB during 8 minute simple conversation over 2 sessions.    Time 3   Period Weeks   Status On-going          SLP Long Term Goals - 02/06/17 1336      SLP LONG TERM GOAL #1   Title Pt will average loud "AH!" of 90dB over 4 sessions with mod I   Baseline 02/06/17   Time 7   Period Weeks   Status On-going     SLP LONG TERM GOAL #2   Title Pt will maintain average of 70dB over 15 minute conversation with rare min A over 2 sessions   Time 7   Period Weeks   Status On-going  SLP LONG TERM GOAL #3   Title Pt will maintain audible volume over 12 minute conversation in noisy environment with rare min A over 2 sessions   Time 7   Period Weeks   Status On-going          Plan - 02/06/17 1332    Clinical Impression Statement Mrs. Carlota Raspberry required rare to occasional min A to maintain volume during conversation. She reorts her family has noted her improvement in her volume. Conversation in between structured speech tasks required usual min A as volume dropped. Continue skilled ST to maximize intellgibiltiy.   Speech Therapy Frequency 2x / week   Treatment/Interventions Compensatory strategies;Functional tasks;Patient/family education;SLP instruction and feedback;Internal/external aids;Environmental controls   Potential to [place] "ah" , functional phrases   Consulted and Agree with Plan of Care Patient      Patient will benefit from skilled therapeutic intervention in order to improve the following deficits and impairments:   Dysarthria and anarthria    Problem List Patient Active Problem  List   Diagnosis Date Noted  . Muscle weakness of lower extremity 01/17/2015  . Parkinson disease ([place]) 01/17/2015  . BV (bacterial vaginosis) 02/18/2013  . Fibroid uterus 11/13/2012  . Benign colonic polyp 10/02/2012    Bernadean Saling, Annye Rusk MS, CCC-SLP 02/06/2017, 1:37 PM  [facility] [address] [place], [place], [address] Phone: [phone]   Fax:  [phone]   Name: [name] [name] MRN: [id number] Date of Birth: 11/30/1950

## 2017-02-11 ENCOUNTER — Ambulatory Visit: Payer: Medicare Other | Admitting: Speech Pathology

## 2017-02-11 DIAGNOSIS — R471 Dysarthria and anarthria: Secondary | ICD-10-CM

## 2017-02-11 NOTE — Patient Instructions (Signed)
  Be aware of when people lean in or have an expression like they can't hear you.

## 2017-02-11 NOTE — Therapy (Signed)
[facility] [address] [place], [place], [address] Phone: [phone]   Fax:  [phone]  Speech Language Pathology Treatment  Patient Details  Name: [name] [name] MRN: [id number] Date of Birth: 1951-01-05 Referring Provider: Dr. Kathrynn Ducking  Encounter Date: 02/11/2017      End of Session - 02/11/17 1445    Visit Number 6   Number of Visits 17   Date for SLP Re-Evaluation 02/28/17   SLP Start Time 52   SLP Stop Time  1445   SLP Time Calculation (min) 43 min   Activity Tolerance Patient tolerated treatment well      Past Medical History:  Diagnosis Date  . Fibroid   . GERD (gastroesophageal reflux disease)   . Muscle weakness of lower extremity 01/17/2015  . Osteopenia   . Parkinson disease ([place]) 01/17/2015    Past Surgical History:  Procedure Laterality Date  . FINGER SURGERY    . MOUTH SURGERY    . TOE SURGERY    . TONSILLECTOMY    . TUBAL LIGATION      There were no vitals filed for this visit.      Subjective Assessment - 02/11/17 1409    Subjective "I've been practicing at home"   Currently in Pain? No/denies               ADULT SLP TREATMENT - 02/11/17 1409      General Information   Behavior/Cognition Alert;Cooperative;Pleasant mood     Treatment Provided   Treatment provided Cognitive-Linquistic     Pain Assessment   Pain Assessment No/denies pain     Cognitive-Linquistic Treatment   Treatment focused on Voice;Dysarthria   Skilled Treatment Loud /a/ averaged 91dB to recalibrate loudness. Structured speech task with average of 72dB. Simple conversation averaged 70dB. Walking in noisy environment pt required occasional min A to maintain loudness.      Assessment / Recommendations / Plan   Plan Continue with current plan of care     Progression Toward Goals   Progression toward goals Progressing toward goals          SLP Education - 02/11/17 1437    Education provided Yes   Education Details reduce volume decay with improved awareness   Person(s) Educated Patient   Methods Explanation   Comprehension Verbalized understanding          SLP Short Term Goals - 02/11/17 1444      SLP SHORT TERM GOAL #1   Title Pt will average loud "AH!" of 90 dB over 3 sessions with  rare min A   Time 2   Period Weeks   Status On-going     SLP SHORT TERM GOAL #2   Title Pt will average 72dB on structured sentence level speech tasks with rare min A over 2 sessions   Baseline 02/03/17, 02/06/17   Time 2   Status Achieved     SLP SHORT TERM GOAL #3   Title Pt will maintain average of 70dB during 8 minute simple conversation over 2 sessions.    Time 2   Period Weeks   Status On-going          SLP Long Term Goals - 02/11/17 1444      SLP LONG TERM GOAL #1   Title Pt will average loud "AH!" of 90dB over 4 sessions with mod I   Baseline 02/06/17   Time 6   Period Weeks   Status On-going  SLP LONG TERM GOAL #2   Title Pt will maintain average of 70dB over 15 minute conversation with rare min A over 2 sessions   Time 6   Period Weeks   Status On-going     SLP LONG TERM GOAL #3   Title Pt will maintain audible volume over 12 minute conversation in noisy environment with rare min A over 2 sessions   Time 6   Period Weeks   Status On-going          Plan - 02/11/17 1437    Clinical Impression Statement Pt with rare to occasional min A to maintain 70dB in simple conversation  and in noisy environment. Occasional episodes of  volume decay remidied with min A. Continue skilled ST to maximize carryover of volume for impoved intelligibility.   Speech Therapy Frequency 2x / week   Duration Other (comment)   Treatment/Interventions Compensatory strategies;Functional tasks;Patient/family education;SLP instruction and feedback;Internal/external aids;Environmental controls   Potential to [place] "ah" ,  functional phrases   Consulted and Agree with Plan of Care Patient      Patient will benefit from skilled therapeutic intervention in order to improve the following deficits and impairments:   Dysarthria and anarthria    Problem List Patient Active Problem List   Diagnosis Date Noted  . Muscle weakness of lower extremity 01/17/2015  . Parkinson disease ([place]) 01/17/2015  . BV (bacterial vaginosis) 02/18/2013  . Fibroid uterus 11/13/2012  . Benign colonic polyp 10/02/2012    [name] [name], Annye Rusk MS, CCC-SLP 02/11/2017, 2:46 PM  [facility] [address] [place], [place], [address] Phone: [phone]   Fax:  [phone]   Name: [name] [name] MRN: [id number] Date of Birth: 01-30-1951

## 2017-02-13 ENCOUNTER — Ambulatory Visit: Payer: Medicare Other

## 2017-02-13 DIAGNOSIS — R471 Dysarthria and anarthria: Secondary | ICD-10-CM | POA: Diagnosis not present

## 2017-02-13 NOTE — Therapy (Signed)
[facility] [address] [place], [place], [address] Phone: [phone]   Fax:  [phone]  Speech Language Pathology Treatment  Patient Details  Name: [name] [name] MRN: [id number] Date of Birth: 07/20/1951 Referring Provider: Dr. Kathrynn Ducking  Encounter Date: 02/13/2017      End of Session - 02/13/17 1448    Visit Number 7   Number of Visits 17   Date for SLP Re-Evaluation 02/28/17   SLP Start Time 1419   SLP Stop Time  1447   SLP Time Calculation (min) 28 min   Activity Tolerance Patient tolerated treatment well      Past Medical History:  Diagnosis Date  . Fibroid   . GERD (gastroesophageal reflux disease)   . Muscle weakness of lower extremity 01/17/2015  . Osteopenia   . Parkinson disease ([place]) 01/17/2015    Past Surgical History:  Procedure Laterality Date  . FINGER SURGERY    . MOUTH SURGERY    . TOE SURGERY    . TONSILLECTOMY    . TUBAL LIGATION      There were no vitals filed for this visit.      Subjective Assessment - 02/13/17 1420    Subjective Pt here 17 minutes late   Currently in Pain? No/denies               ADULT SLP TREATMENT - 02/13/17 1421      General Information   Behavior/Cognition Alert;Cooperative;Pleasant mood     Treatment Provided   Treatment provided Cognitive-Linquistic     Cognitive-Linquistic Treatment   Treatment focused on Voice;Dysarthria   Skilled Treatment Loud /a/ averaged 92dB to recalibrate loudness. Structured speech task with average of 72dB. Simple conversation in the [facility] room averaged 70dB for 10 minutes. Walking in noisy environment pt again required occasional min A to maintain loudness.      Assessment / Recommendations / Plan   Plan Continue with current plan of care     Progression Toward Goals   Progression toward goals Progressing toward goals            SLP Short Term Goals - 02/13/17 1451      SLP SHORT TERM GOAL  #1   Title Pt will average loud "AH!" of 90 dB over 3 sessions with  rare min A   Baseline 02-11-17; 02-13-17   Time 2   Period Weeks   Status On-going     SLP SHORT TERM GOAL #2   Title Pt will average 72dB on structured sentence level speech tasks with rare min A over 2 sessions   Status Achieved     SLP SHORT TERM GOAL #3   Title Pt will maintain average of 70dB during 8 minute simple conversation over 2 sessions.    Baseline 02-13-17   Time 2   Period Weeks   Status On-going          SLP Long Term Goals - 02/13/17 1452      SLP LONG TERM GOAL #1   Title Pt will average loud "AH!" of 90dB over 4 sessions with mod I   Baseline 02/06/17; 02-11-17, 02-13-17   Time 6   Period Weeks   Status On-going     SLP LONG TERM GOAL #2   Title Pt will maintain average of 70dB over 15 minute conversation with rare min A over 2 sessions   Time 6   Period Weeks   Status On-going  SLP LONG TERM GOAL #3   Title Pt will maintain audible volume over 12 minute conversation in noisy environment with rare min A over 2 sessions   Time 6   Period Weeks   Status On-going          Plan - 02/13/17 1448    Clinical Impression Statement Pt with occasional A to maintain 70dB in simple conversation in noisy environment. In the therapy room pt maintained WNL volume in 8 minutes conversation 80% of the time. Continue skilled ST to maximize carryover of volume for impoved intelligibility.   Speech Therapy Frequency 2x / week   Duration Other (comment)  8 weeks   Treatment/Interventions Compensatory strategies;Functional tasks;Patient/family education;SLP instruction and feedback;Internal/external aids;Environmental controls   Potential to Achieve Goals Good      Patient will benefit from skilled therapeutic intervention in order to improve the following deficits and impairments:   Dysarthria and anarthria    Problem List Patient Active Problem List   Diagnosis Date Noted  . Muscle  weakness of lower extremity 01/17/2015  . Parkinson disease ([place]) 01/17/2015  . BV (bacterial vaginosis) 02/18/2013  . Fibroid uterus 11/13/2012  . Benign colonic polyp 10/02/2012    [facility] ,MS, CCC-SLP  02/13/2017, 2:53 PM  [facility] [address] [place], [place], [address] Phone: [phone]   Fax:  [phone]   Name: [name] [name] MRN: [id number] Date of Birth: [birth year]

## 2017-02-13 NOTE — Patient Instructions (Signed)
  Please complete the assigned speech therapy homework prior to your next session and return it to the speech therapist at your next visit.  

## 2017-02-18 ENCOUNTER — Ambulatory Visit: Payer: Medicare Other | Admitting: Speech Pathology

## 2017-02-20 ENCOUNTER — Ambulatory Visit: Payer: Medicare Other | Admitting: Speech Pathology

## 2017-02-20 DIAGNOSIS — R471 Dysarthria and anarthria: Secondary | ICD-10-CM

## 2017-02-20 NOTE — Therapy (Signed)
[facility] [address] [place], [place], [address] Phone: [phone]   Fax:  [phone]  Speech Language Pathology Treatment  Patient Details  Name: [name] [name] MRN: [id number] Date of Birth: [birth year] Referring Provider: Dr. Kathrynn Ducking  Encounter Date: 02/20/2017      End of Session - 02/20/17 1448    Visit Number 8   Number of Visits 57   SLP Start Time 3790   SLP Stop Time  1446   SLP Time Calculation (min) 40 min      Past Medical History:  Diagnosis Date  . Fibroid   . GERD (gastroesophageal reflux disease)   . Muscle weakness of lower extremity 01/17/2015  . Osteopenia   . Parkinson disease ([place]) 01/17/2015    Past Surgical History:  Procedure Laterality Date  . FINGER SURGERY    . MOUTH SURGERY    . TOE SURGERY    . TONSILLECTOMY    . TUBAL LIGATION      There were no vitals filed for this visit.             ADULT SLP TREATMENT - 02/20/17 1414      General Information   Behavior/Cognition Alert;Cooperative;Pleasant mood     Treatment Provided   Treatment provided Cognitive-Linquistic     Pain Assessment   Pain Assessment No/denies pain     Cognitive-Linquistic Treatment   Treatment focused on Voice;Dysarthria   Skilled Treatment Loud /a/ averaged 92dB to recalibrate loudness. Structured speech task with average of 72dB. Simple conversation in the [place] room averaged 70dB for 10 minutes. Walking in noisy environment pt again required occasional min A to maintain loudness due to volume decay     Assessment / Recommendations / Plan   Plan Continue with current plan of care     Progression Toward Goals   Progression toward goals Progressing toward goals          SLP Education - 02/20/17 1442    Education provided Yes   Education Details volume decay awareness   Person(s) Educated Patient   Methods Explanation   Comprehension Verbalized understanding          SLP Short Term Goals - 02/20/17 1447      SLP SHORT TERM GOAL #1   Title Pt will average loud "AH!" of 90 dB over 3 sessions with  rare min A   Baseline 02-11-17; 02-13-17, 02/20/17   Time 2   Period Weeks   Status Achieved     SLP SHORT TERM GOAL #2   Title Pt will average 72dB on structured sentence level speech tasks with rare min A over 2 sessions   Status Achieved     SLP SHORT TERM GOAL #3   Title Pt will maintain average of 70dB during 8 minute simple conversation over 2 sessions.    Baseline 02-13-17   Time 2   Period Weeks   Status On-going          SLP Long Term Goals - 02/20/17 1448      SLP LONG TERM GOAL #1   Title Pt will average loud "AH!" of 90dB over 4 sessions with mod I   Baseline 02/06/17; 02-11-17, 02-13-17, 02/20/17   Time 5   Period Weeks   Status On-going     SLP LONG TERM GOAL #2   Title Pt will maintain average of 70dB over 15 minute conversation with rare min A over 2 sessions  Time 5   Period Weeks   Status On-going     SLP LONG TERM GOAL #3   Title Pt will maintain audible volume over 12 minute conversation in noisy environment with rare min A over 2 sessions   Time 5   Period Weeks   Status On-going          Plan - 02/20/17 1442    Clinical Impression Statement Pt with occasional A to maintain 70dB in simple conversation in noisy environment. In the therapy room pt maintained WNL volume in 8 minutes conversation 80% of the time. Continue skilled ST to maximize carryover of volume for impoved intelligibility.   Speech Therapy Frequency 2x / week   Treatment/Interventions Compensatory strategies;Functional tasks;Patient/family education;SLP instruction and feedback;Internal/external aids;Environmental controls   Potential to [place] "ah" , functional phrases   Consulted and Agree with Plan of Care Patient      Patient will benefit from skilled therapeutic intervention in order to improve the  following deficits and impairments:   Dysarthria and anarthria    Problem List Patient Active Problem List   Diagnosis Date Noted  . Muscle weakness of lower extremity 01/17/2015  . Parkinson disease ([place]) 01/17/2015  . BV (bacterial vaginosis) 02/18/2013  . Fibroid uterus 11/13/2012  . Benign colonic polyp 10/02/2012    Heily Carlucci, Annye Rusk MS, CCC-SLP 02/20/2017, 2:49 PM  [facility] [address] [place], [place], [address] Phone: [phone]   Fax:  [phone]   Name: [name] [name] MRN: [id number] Date of Birth: Apr 15, 1951

## 2017-02-25 ENCOUNTER — Ambulatory Visit: Payer: Medicare Other | Attending: Neurology | Admitting: Speech Pathology

## 2017-02-25 DIAGNOSIS — R471 Dysarthria and anarthria: Secondary | ICD-10-CM | POA: Insufficient documentation

## 2017-02-25 NOTE — Therapy (Signed)
[facility] [address] [place], [place], [address] Phone: [phone]   Fax:  [phone]  Speech Language Pathology Treatment  Patient Details  Name: [name] [name] MRN: [id number] Date of Birth: 1951/03/29 Referring Provider: Dr. Kathrynn Ducking  Encounter Date: 02/25/2017      End of Session - 02/25/17 1440    Visit Number 9   Number of Visits 17   Date for SLP Re-Evaluation 02/28/17   SLP Start Time 62   SLP Stop Time  1436   SLP Time Calculation (min) 35 min   Activity Tolerance Patient tolerated treatment well;Patient limited by fatigue      Past Medical History:  Diagnosis Date  . Fibroid   . GERD (gastroesophageal reflux disease)   . Muscle weakness of lower extremity 01/17/2015  . Osteopenia   . Parkinson disease ([place]) 01/17/2015    Past Surgical History:  Procedure Laterality Date  . FINGER SURGERY    . MOUTH SURGERY    . TOE SURGERY    . TONSILLECTOMY    . TUBAL LIGATION      There were no vitals filed for this visit.      Subjective Assessment - 02/25/17 1412    Subjective "I've been aware of how my volume gets lower when I talk in conversation and I try to get louder"               ADULT SLP TREATMENT - 02/25/17 1413      General Information   Behavior/Cognition Alert;Cooperative;Pleasant mood     Treatment Provided   Treatment provided Cognitive-Linquistic     Pain Assessment   Pain Assessment No/denies pain     Cognitive-Linquistic Treatment   Treatment focused on Voice;Dysarthria   Skilled Treatment Loud /a/ to recalibrate loudness average of 92dB. Structured speech tasks average of 72dB with rare min A. Simple conversation averaged 70dB in mildly noisy environment - with occasional min A to maintain volume. pt with "shaky legs" and not feeling like walking today.      Assessment / Recommendations / Plan   Plan Continue with current plan of care     Progression  Toward Goals   Progression toward goals Progressing toward goals          SLP Education - 02/25/17 1438    Education provided Yes   Education Details be aware of noise in environment and talk over it   Person(s) Educated Patient   Methods Explanation   Comprehension Verbalized understanding          SLP Short Term Goals - 02/25/17 1440      SLP SHORT TERM GOAL #1   Title Pt will average loud "AH!" of 90 dB over 3 sessions with  rare min A   Baseline 02-11-17; 02-13-17, 02/20/17   Time 2   Period Weeks   Status Achieved     SLP SHORT TERM GOAL #2   Title Pt will average 72dB on structured sentence level speech tasks with rare min A over 2 sessions   Status Achieved     SLP SHORT TERM GOAL #3   Title Pt will maintain average of 70dB during 8 minute simple conversation over 2 sessions.    Baseline 02-13-17, 02/25/17   Time 2   Period Weeks   Status Achieved          SLP Long Term Goals - 02/25/17 1440      SLP LONG TERM GOAL #1  Title Pt will average loud "AH!" of 90dB over 4 sessions with mod I   Baseline 02/06/17; 02-11-17, 02-13-17, 02/20/17   Time 5   Period Weeks   Status Achieved     SLP LONG TERM GOAL #2   Title Pt will maintain average of 70dB over 15 minute conversation with rare min A over 2 sessions   Time 4   Period Weeks   Status On-going     SLP LONG TERM GOAL #3   Title Pt will maintain audible volume over 12 minute conversation in noisy environment with rare min A over 2 sessions   Time 4   Period Weeks   Status On-going          Plan - 02/25/17 1438    Clinical Impression Statement Pt maintained 70dB with occasional min A, in noisy environment (mildly noisy) pt maintained audible volume with occasional min A due to volume decay. Continue skilled ST to maximize intelligibility   Speech Therapy Frequency 2x / week   Treatment/Interventions Compensatory strategies;Functional tasks;Patient/family education;SLP instruction and  feedback;Internal/external aids;Environmental controls   Potential to Achieve Goals Good   Consulted and Agree with Plan of Care Patient      Patient will benefit from skilled therapeutic intervention in order to improve the following deficits and impairments:   Dysarthria and anarthria    Problem List Patient Active Problem List   Diagnosis Date Noted  . Muscle weakness of lower extremity 01/17/2015  . Parkinson disease ([place]) 01/17/2015  . BV (bacterial vaginosis) 02/18/2013  . Fibroid uterus 11/13/2012  . Benign colonic polyp 10/02/2012    Arshdeep Bolger, Annye Rusk  MS, CCC-SLP 02/25/2017, 2:41 PM  [facility] [address] [place], [place], [address] Phone: [phone]   Fax:  [phone]   Name: [name] [name] MRN: [id number] Date of Birth: 13-Mar-1951

## 2017-02-27 ENCOUNTER — Ambulatory Visit: Payer: Medicare Other | Admitting: Speech Pathology

## 2017-02-27 DIAGNOSIS — R471 Dysarthria and anarthria: Secondary | ICD-10-CM | POA: Diagnosis not present

## 2017-02-27 NOTE — Therapy (Signed)
[facility] [address] [place], [place], [address] Phone: [phone]   Fax:  [phone]  Speech Language Pathology Treatment  Patient Details  Name: [name] [name] MRN: [id number] Date of Birth: [birth year] Referring Provider: Dr. Kathrynn Ducking  Encounter Date: 02/27/2017      End of Session - 02/27/17 1448    Visit Number 10   Number of Visits 17   Date for SLP Re-Evaluation 02/28/17   SLP Start Time 14   SLP Stop Time  1443   SLP Time Calculation (min) 42 min   Activity Tolerance Patient tolerated treatment well      Past Medical History:  Diagnosis Date  . Fibroid   . GERD (gastroesophageal reflux disease)   . Muscle weakness of lower extremity 01/17/2015  . Osteopenia   . Parkinson disease ([place]) 01/17/2015    Past Surgical History:  Procedure Laterality Date  . FINGER SURGERY    . MOUTH SURGERY    . TOE SURGERY    . TONSILLECTOMY    . TUBAL LIGATION      There were no vitals filed for this visit.      Subjective Assessment - 02/27/17 1411    Subjective "I'm very very aware of my speaking - I was practicing in the waiting room"   Currently in Pain? No/denies               ADULT SLP TREATMENT - 02/27/17 1412      General Information   Behavior/Cognition Alert;Cooperative;Pleasant mood     Treatment Provided   Treatment provided Cognitive-Linquistic     Pain Assessment   Pain Assessment No/denies pain     Cognitive-Linquistic Treatment   Treatment focused on Voice;Dysarthria   Skilled Treatment Pt perfrom loud /a/ to recalibrate loudness average of 93dB with mod I.  [place] speech task with average of 72dB with rare min A. Simple conversation in noisy environment walking over 10 minutes, as pt requested to return to treatment room and sit as her legs felt weak. Simple conversation over 15 minutes in tx room with average 71dB with rare min A.     Assessment /  Recommendations / Plan   Plan Continue with current plan of care     Progression Toward Goals   Progression toward goals Progressing toward goals          SLP Education - 02/27/17 1445    Education provided Yes   Education Details loud /a/ ongoing even upon d/c   Person(s) Educated Patient   Methods Explanation   Comprehension Verbalized understanding          SLP Short Term Goals - 02/27/17 1447      SLP SHORT TERM GOAL #1   Title Pt will average loud "AH!" of 90 dB over 3 sessions with  rare min A   Baseline 02-11-17; 02-13-17, 02/20/17   Time 2   Period Weeks   Status Achieved     SLP SHORT TERM GOAL #2   Title Pt will average 72dB on structured sentence level speech tasks with rare min A over 2 sessions   Status Achieved     SLP SHORT TERM GOAL #3   Title Pt will maintain average of 70dB during 8 minute simple conversation over 2 sessions.    Baseline 02-13-17, 02/25/17   Time 2   Period Weeks   Status Achieved          SLP Long Term Goals -  2017/03/01 [place] #1   Title Pt will average loud "AH!" of 90dB over 4 sessions with mod I   Baseline 02/06/17; 02-11-17, 02-13-17, 02/20/17   Time 5   Period Weeks   Status Achieved     SLP LONG TERM GOAL #2   Title Pt will maintain average of 70dB over 15 minute conversation with rare min A over 2 sessions   Baseline Mar 01, 2017;   Time 4   Period Weeks   Status On-going     SLP LONG TERM GOAL #3   Title Pt will maintain audible volume over 12 minute conversation in noisy environment with rare min A over 2 sessions   Baseline 01-Mar-2017   Time 4   Period Weeks   Status On-going          Plan - 03/01/2017 1446    Clinical Impression Statement Pt maintained 70+dB over 10 minute walk outside in noise and over 15 min in treatment room. Rare min volume decay today. Continue skilled ST 1-2 more visits to maximize carryover of volume and compensations for intellgiblility   Speech Therapy Frequency 1x /week    Treatment/Interventions Compensatory strategies;Functional tasks;Patient/family education;SLP instruction and feedback;Internal/external aids;Environmental controls   Potential to Achieve Goals Good   SLP Home Exercise Plan Loud "ah" , functional phrases   Consulted and Agree with Plan of Care Patient     Speech Therapy Progress Note  Dates of Reporting Period: 12/31/16  to 03/01/2017  Objective Reports of Subjective Statement: Pt regularly reporting her family states they can hear her and understand her better  Objective Measurements: Pt with 70dB average with rare min A in conversation -   Goal Update: Continue current goals  Plan: Continue POC likely 1-2 more visits  Reason Skilled Services are Required: Pt has recently been maintaining volume with rare min A and reduced volume decay. Continue skilled ST to maximize carryover of volume and intelligibility across community and home settings.   Patient will benefit from skilled therapeutic intervention in order to improve the following deficits and impairments:   Dysarthria and anarthria      G-Codes - 03-01-17 1448    Functional Assessment Tool Used NOMS   Functional Limitations Motor speech   Motor Speech Current Status [phone]) At least 1 percent but less than 20 percent impaired, limited or restricted   Motor Speech Goal Status (G6269) At least 1 percent but less than 20 percent impaired, limited or restricted      Problem List Patient Active Problem List   Diagnosis Date Noted  . Muscle weakness of lower extremity 01/17/2015  . Parkinson disease ([facility]) 01/17/2015  . BV (bacterial vaginosis) 02/18/2013  . Fibroid uterus 11/13/2012  . Benign colonic polyp 10/02/2012    [name], Annye Rusk  MS, CCC-SLP 2017-03-01, 2:49 PM  [facility] [address] [place], [place], [address] Phone: [phone]   Fax:  [phone]   Name: [name] [name] MRN: [id number] Date  of Birth: April 30, 1951

## 2017-03-04 ENCOUNTER — Ambulatory Visit: Payer: Medicare Other | Admitting: Speech Pathology

## 2017-03-04 DIAGNOSIS — R471 Dysarthria and anarthria: Secondary | ICD-10-CM

## 2017-03-04 NOTE — Therapy (Signed)
[facility] [address] [place], [place], [address] Phone: [phone]   Fax:  [phone]  Speech Language Pathology Treatment  Patient Details  Name: [name] [name] MRN: [id number] Date of Birth: [birth year] Referring Provider: Dr. Kathrynn Ducking  Encounter Date: 03/04/2017      End of Session - 03/04/17 1454    Visit Number 11   Number of Visits 17   Date for SLP Re-Evaluation 02/28/17      Past Medical History:  Diagnosis Date  . Fibroid   . GERD (gastroesophageal reflux disease)   . Muscle weakness of lower extremity 01/17/2015  . Osteopenia   . Parkinson disease ([facility]) 01/17/2015    Past Surgical History:  Procedure Laterality Date  . FINGER SURGERY    . MOUTH SURGERY    . TOE SURGERY    . TONSILLECTOMY    . TUBAL LIGATION      There were no vitals filed for this visit.      Subjective Assessment - 03/04/17 1408    Subjective "I've been talking loud"               ADULT SLP TREATMENT - 03/04/17 1408      General Information   Behavior/Cognition Alert;Cooperative;Pleasant mood     Treatment Provided   Treatment provided Cognitive-Linquistic     Pain Assessment   Pain Assessment No/denies pain     Cognitive-Linquistic Treatment   Treatment focused on Voice;Dysarthria   Skilled Treatment Loud /a/ average 93dB - with mod I. Simple conversation average 71dB with supervision cues. In noisy environment, pt remained audible over 15 minutes of conversation with supervision cues.      Assessment / Recommendations / Plan   Plan Discharge SLP treatment due to (comment)     Progression Toward Goals   Progression toward goals Goals met, education completed, patient discharged from [facility] Education - 03/04/17 1451    Education provided Yes   Education Details continue loud /a/ and volume practice upon d/c   Person(s) Educated Patient   Methods Explanation   Comprehension  Verbalized understanding    SPEECH THERAPY DISCHARGE SUMMARY  Visits from Start of Care: 11  Current functional level related to goals / functional outcomes: See goals below   Remaining deficits: Hypokinetic dysarthria   Education / Equipment: Compensations for dysarthria, loud /a/ HEP for dysarthria Plan: Patient agrees to discharge.  Patient goals were met. Patient is being discharged due to meeting the stated rehab goals.  ?????           SLP Short Term Goals - 03/04/17 1453      SLP SHORT TERM GOAL #1   Title Pt will average loud "AH!" of 90 dB over 3 sessions with  rare min A   Baseline 02-11-17; 02-13-17, 02/20/17   Time 2   Period Weeks   Status Achieved     SLP SHORT TERM GOAL #2   Title Pt will average 72dB on structured sentence level speech tasks with rare min A over 2 sessions   Status Achieved     SLP SHORT TERM GOAL #3   Title Pt will maintain average of 70dB during 8 minute simple conversation over 2 sessions.    Baseline 02-13-17, 02/25/17   Time 2   Period Weeks   Status Achieved          SLP Long Term Goals - 03/04/17 1453  SLP LONG TERM GOAL #1   Title Pt will average loud "AH!" of 90dB over 4 sessions with mod I   Baseline 02/06/17; 02-11-17, 02-13-17, 02/20/17   Time 5   Period Weeks   Status Achieved     SLP LONG TERM GOAL #2   Title Pt will maintain average of 70dB over 15 minute conversation with rare min A over 2 sessions   Baseline 02/27/17; 03-31-17   Time 4   Period Weeks   Status Achieved     SLP LONG TERM GOAL #3   Title Pt will maintain audible volume over 12 minute conversation in noisy environment with rare min A over 2 sessions   Baseline 02/27/17; 03-31-2017   Time 4   Period Weeks   Status Achieved          Plan - Mar 31, 2017 1452    Clinical Impression Statement Pt has met ST goals and education is complete. D/c from [facility] - pt to return for OT/PT evals in September per PD protocol.   Treatment/Interventions Compensatory  strategies;Functional tasks;Patient/family education;SLP instruction and feedback;Internal/external aids;Environmental controls   Potential to Achieve Goals Good      Patient will benefit from skilled therapeutic intervention in order to improve the following deficits and impairments:   Dysarthria and anarthria      G-Codes - 03-31-2017 1454    Functional Assessment Tool Used NOMS   Functional Limitations Motor speech   Motor Speech Goal Status [phone]) At least 1 percent but less than 20 percent impaired, limited or restricted   Motor Speech Goal Status (G6659) At least 1 percent but less than 20 percent impaired, limited or restricted      Problem List Patient Active Problem List   Diagnosis Date Noted  . Muscle weakness of lower extremity 01/17/2015  . Parkinson disease ([place]) 01/17/2015  . BV (bacterial vaginosis) 02/18/2013  . Fibroid uterus 11/13/2012  . Benign colonic polyp 10/02/2012    Maree Ainley, Annye Rusk MS, CCC-SLP 03-31-2017, 2:55 PM  [facility] [address] [place], [place], [address] Phone: [phone]   Fax:  [phone]   Name: CELENA LANIUS MRN: [id number] Date of Birth: 16-Apr-1951

## 2017-03-06 ENCOUNTER — Encounter: Payer: 59 | Admitting: Speech Pathology

## 2017-03-11 ENCOUNTER — Encounter: Payer: 59 | Admitting: Speech Pathology

## 2017-03-13 ENCOUNTER — Encounter: Payer: 59 | Admitting: Speech Pathology

## 2017-04-25 DIAGNOSIS — G20 Parkinson's disease: Secondary | ICD-10-CM | POA: Diagnosis not present

## 2017-04-25 DIAGNOSIS — R4189 Other symptoms and signs involving cognitive functions and awareness: Secondary | ICD-10-CM | POA: Diagnosis not present

## 2017-04-25 DIAGNOSIS — Z79899 Other long term (current) drug therapy: Secondary | ICD-10-CM | POA: Diagnosis not present

## 2017-06-26 ENCOUNTER — Ambulatory Visit: Payer: Medicare Other | Admitting: Neurology

## 2017-07-08 ENCOUNTER — Ambulatory Visit: Payer: Medicare Other | Admitting: Occupational Therapy

## 2017-07-08 ENCOUNTER — Ambulatory Visit: Payer: Medicare Other | Admitting: Physical Therapy

## 2017-07-10 ENCOUNTER — Ambulatory Visit: Payer: Medicare Other | Admitting: Neurology

## 2018-04-17 ENCOUNTER — Emergency Department (HOSPITAL_BASED_OUTPATIENT_CLINIC_OR_DEPARTMENT_OTHER)
Admission: EM | Admit: 2018-04-17 | Discharge: 2018-04-17 | Disposition: A | Discharge status: Home / Self Care | Payer: Medicare PPO | Attending: Emergency Medicine | Admitting: Emergency Medicine

## 2018-04-17 ENCOUNTER — Emergency Department (HOSPITAL_BASED_OUTPATIENT_CLINIC_OR_DEPARTMENT_OTHER): Payer: Medicare PPO

## 2018-04-17 ENCOUNTER — Encounter (HOSPITAL_BASED_OUTPATIENT_CLINIC_OR_DEPARTMENT_OTHER): Payer: Self-pay | Admitting: Emergency Medicine

## 2018-04-17 ENCOUNTER — Other Ambulatory Visit: Payer: Self-pay

## 2018-04-17 DIAGNOSIS — R42 Dizziness and giddiness: Secondary | ICD-10-CM

## 2018-04-17 DIAGNOSIS — R160 Hepatomegaly, not elsewhere classified: Secondary | ICD-10-CM | POA: Diagnosis not present

## 2018-04-17 DIAGNOSIS — R002 Palpitations: Secondary | ICD-10-CM | POA: Diagnosis present

## 2018-04-17 DIAGNOSIS — R918 Other nonspecific abnormal finding of lung field: Secondary | ICD-10-CM | POA: Diagnosis not present

## 2018-04-17 LAB — BASIC METABOLIC PANEL
Anion gap: 6 (ref 5–15)
BUN: 18 mg/dL (ref 6–20)
CO2: 26 mmol/L (ref 22–32)
Calcium: 9.7 mg/dL (ref 8.9–10.3)
Chloride: 106 mmol/L (ref 101–111)
Creatinine, Ser: 0.68 mg/dL (ref 0.44–1.00)
GFR calc Af Amer: >60 mL/min (ref >60)
GFR calc non Af Amer: >60 mL/min (ref >60)
Glucose, Bld: 112 mg/dL — ABNORMAL HIGH (ref 65–99)
Potassium: 3.7 mmol/L (ref 3.5–5.1)
Sodium: 138 mmol/L (ref 135–145)

## 2018-04-17 LAB — D-DIMER, QUANTITATIVE: D-Dimer, Quant: 1.25 ug/mL-FEU — ABNORMAL HIGH (ref 0.00–0.50)

## 2018-04-17 LAB — TROPONIN I: Troponin I: <0.03 ng/mL (ref <0.03)

## 2018-04-17 LAB — CBC
HCT: 40.8 % (ref 36.0–46.0)
Hemoglobin: 13.8 g/dL (ref 12.0–15.0)
MCH: 29.4 pg (ref 26.0–34.0)
MCHC: 33.8 g/dL (ref 30.0–36.0)
MCV: 87 fL (ref 78.0–100.0)
Platelets: 147 10*3/uL — ABNORMAL LOW (ref 150–400)
RBC: 4.69 MIL/uL (ref 3.87–5.11)
RDW: 13 % (ref 11.5–15.5)
WBC: 5.8 10*3/uL (ref 4.0–10.5)

## 2018-04-17 MED ORDER — IOPAMIDOL (ISOVUE-370) INJECTION 76%
100.0000 mL | Freq: Once | INTRAVENOUS | Status: AC | PRN
  Administered 2018-04-17: 66 mL via INTRAVENOUS

## 2018-04-17 MED ORDER — SODIUM CHLORIDE 0.9 % IV BOLUS
1000.0000 mL | Freq: Once | INTRAVENOUS | Status: AC
  Administered 2018-04-17: 1000 mL via INTRAVENOUS

## 2018-04-17 NOTE — ED Provider Notes (Signed)
Emergency Department Provider Note   I have reviewed the triage vital signs and the nursing notes.   HISTORY  Chief Complaint Palpitations   HPI [name] [name] is a 67 y.o. female with PMH of Parkinson's disease and GERD presents to the emergency department for evaluation of heart palpitations, lightheadedness, shortness of breath, near syncope.  Going for the last 10 days.  She describes a fluttering in the chest with sometimes pounding sensation.  No chest pain.  She notices dyspnea.  Symptoms are worse during the day.  She is able to sleep at night.  She states she has lightheadedness and feels like she might pass out sometimes when bending over.  No recent dietary changes.  She is been trying to stay hydrated.  No other modifying factors.  No lower extremity swelling.   Past Medical History:  Diagnosis Date  . Fibroid   . GERD (gastroesophageal reflux disease)   . Muscle weakness of lower extremity 01/17/2015  . Osteopenia   . Parkinson disease ([place]) 01/17/2015    Patient Active Problem List   Diagnosis Date Noted  . Muscle weakness of lower extremity 01/17/2015  . Parkinson disease ([place]) 01/17/2015  . BV (bacterial vaginosis) 02/18/2013  . Fibroid uterus 11/13/2012  . Benign colonic polyp 10/02/2012    Past Surgical History:  Procedure Laterality Date  . FINGER SURGERY    . MOUTH SURGERY    . TOE SURGERY    . TONSILLECTOMY    . TUBAL LIGATION      Allergies Sulfa antibiotics  Family History  Problem Relation Age of Onset  . Rheum arthritis Mother   . Breast cancer Mother        31's  . Cancer Father        throat  . Breast cancer Sister        3's  . Hypertension Brother   . Hyperlipidemia Brother   . Heart attack Son        Deceased, 41  . Healthy Daughter   . Parkinsonism Neg Hx     Social History Social History   Tobacco Use  . Smoking status: Never Smoker  . Smokeless tobacco: Never Used  Substance Use Topics  . Alcohol use: No   Alcohol/week: 0.0 oz  . Drug use: No    Review of Systems  Constitutional: No fever/chills. Positive lightheadedness.  Eyes: No visual changes. ENT: No sore throat. Cardiovascular: Denies chest pain. Positive heart palpitations.  Respiratory: Positive intermittent dyspnea.  Gastrointestinal: No abdominal pain.  No nausea, no vomiting.  No diarrhea.  No constipation. Genitourinary: Negative for dysuria. Musculoskeletal: Negative for back pain. Skin: Negative for rash. Neurological: Negative for headaches, focal weakness or numbness.  10-point ROS otherwise negative.  ____________________________________________   PHYSICAL EXAM:  VITAL SIGNS: Vitals:   04/17/18 1328  BP: (!) 131/92  Pulse: (!) 107  Resp: 20  SpO2: 100%    Constitutional: Alert and oriented. Well appearing and in no acute distress. Patient is notably thin.  Eyes: Conjunctivae are normal.  Head: Atraumatic. Nose: No congestion/rhinnorhea. Mouth/Throat: Mucous membranes are dry.  Neck: No stridor.  Cardiovascular: Tachycardia. Good peripheral circulation. Grossly normal heart sounds.   Respiratory: Normal respiratory effort.  No retractions. Lungs CTAB. Gastrointestinal: Soft and nontender. No distention.  Musculoskeletal: No lower extremity tenderness nor edema. No gross deformities of extremities. Neurologic:  Normal speech and language. No gross focal neurologic deficits are appreciated.  Skin:  Skin is warm, dry and intact. No  rash noted.   ____________________________________________   LABS (all labs ordered are listed, but only abnormal results are displayed)  Labs Reviewed  BASIC METABOLIC PANEL - Abnormal; Notable for the following components:      Result Value   Glucose, Bld 112 (*)    All other components within normal limits  CBC - Abnormal; Notable for the following components:   Platelets 147 (*)    All other components within normal limits  D-DIMER, QUANTITATIVE (NOT AT [facility]) -  Abnormal; Notable for the following components:   D-Dimer, Quant 1.25 (*)    All other components within normal limits  TROPONIN I   ____________________________________________  EKG   EKG Interpretation  Date/Time:  Friday April 17 2018 12:39:00 EDT Ventricular Rate:  104 PR Interval:    QRS Duration: 103 QT Interval:  332 QTC Calculation: 439 R Axis:   0 Text Interpretation:  Sinus tachycardia Multiform ventricular premature complexes Baseline wander in lead(s) II III aVF No STEMI. Similar to prior tracing.  Confirmed by Nanda Quinton [phone]) on 04/17/2018 12:59:20 PM       ____________________________________________  RADIOLOGY  Dg Chest 2 View  Result Date: 04/17/2018 CLINICAL DATA:  Palpitations. EXAM: CHEST - 2 VIEW COMPARISON:  Chest x-ray dated December 24, 2013. FINDINGS: The heart remains at the upper limits of normal in size. Normal pulmonary vascularity. Lungs remain hyperinflated with emphysematous changes. No focal consolidation, pleural effusion, or pneumothorax. No acute osseous abnormality. IMPRESSION: COPD.  No active cardiopulmonary disease. Electronically Signed   By: Titus Dubin M.D.   On: 04/17/2018 13:13   Ct Angio Chest Pe W And/or Wo Contrast  Result Date: 04/17/2018 CLINICAL DATA:  Heart palpitations. Positive D-dimer. Shortness of breath. EXAM: CT ANGIOGRAPHY CHEST WITH CONTRAST TECHNIQUE: Multidetector CT imaging of the chest was performed using the standard protocol during bolus administration of intravenous contrast. Multiplanar CT image reconstructions and MIPs were obtained to evaluate the vascular anatomy. CONTRAST:  46mL ISOVUE-370 IOPAMIDOL (ISOVUE-370) INJECTION 76% COMPARISON:  Chest x-ray dated 04/17/2018 FINDINGS: Cardiovascular: Satisfactory opacification of the pulmonary arteries to the segmental level. No evidence of pulmonary embolism. Mild cardiomegaly. No pericardial effusion. Mediastinum/Nodes: No enlarged mediastinal, hilar, or axillary  lymph nodes. Thyroid gland, trachea, and esophagus demonstrate no significant findings. Lungs/Pleura: There is a 3.4 mm nodule in the left lung apex on image 11 of series 5. There is a linear area of atelectasis in the left lower lobe. No effusions. Upper Abdomen: There are multiple irregular low-density lesions in the right and left lobes of the liver worrisome for metastatic disease. The largest lesion is in the right lobe measures 49 mm in diameter. There is a 32 mm lesion in the lateral aspect of the left lobe. There is a 15 mm lesion in the inferior aspect of the left lobe. Her poorly defined masses in the dome of the right lobe. No dilated bile ducts. 3.3 cm low-density lesion on the anterior aspect of the left kidney, most likely a cyst. Musculoskeletal: No discrete bone metastases. There is a slightly mottled appearance of the marrow of multiple thoracic and upper lumbar vertebral bodies. Review of the MIP images confirms the above findings. IMPRESSION: 1. No pulmonary emboli. 2. Multiple masses in the liver worrisome for metastatic disease of unknown primary. 3. Mild cardiomegaly. 4. 3.4 mm nodule in the left lung apex. Given the multiple lesions in the liver, the possibility of metastatic disease should be considered. Electronically Signed   By: Lorriane Shire M.D.  On: 04/17/2018 14:16    ____________________________________________   PROCEDURES  Procedure(s) performed:   Procedures  None ____________________________________________   INITIAL IMPRESSION / ASSESSMENT AND PLAN / ED COURSE  Pertinent labs & imaging results that were available during my care of the patient were reviewed by me and considered in my medical decision making (see chart for details).  She presents to the emergency department for evaluation of shortness of breath, heart palpitations, lightheadedness.  Symptoms sound mainly positional as if the patient could be dehydrated.  She does have sinus tachycardia here.   EKG reviewed with no acute ischemic changes.  No chest pain.  Doubt atypical ACS.  Do some concern for possible PE but symptoms are atypical.  Given her tachycardia plan for d-dimer.  Chest x-ray reviewed with no acute findings.   02:30 PM Patient's d-dimer is elevated.  Remaining labs reviewed with no acute findings.  Chest x-ray normal.  CT angios obtained after d-dimer found to be elevated in the setting of tachycardia and intermittent dyspnea.  CT results show no PE but concern for lung mass and possible metastatic disease to the liver.  Patient is having no abdominal pain, nausea, vomiting.  Patient is thin but denies unintentional weight loss.  I discussed the CT findings in detail with the patient and impressed upon her the importance of very close oncology follow-up.  I placed an ambulatory referral to her local oncology office divided contact information at discharge.  Encouraged her to call this afternoon to set up an appointment for next week. Patient feeling better after IVF. Plan for discharge at this time.   I have reviewed and discussed all results (EKG, imaging, lab, urine as appropriate), exam findings with patient. I have reviewed nursing notes and appropriate previous records.  I feel the patient is safe to be discharged home without further emergent workup. Discussed usual and customary return precautions. Patient and family (if present) verbalize understanding and are comfortable with this plan.  Patient will follow-up with their primary care provider. If they do not have a primary care provider, information for follow-up has been provided to them. All questions have been answered.  ____________________________________________  FINAL CLINICAL IMPRESSION(S) / ED DIAGNOSES  Final diagnoses:  Heart palpitations  Lightheadedness  Lung mass  Liver masses     MEDICATIONS GIVEN DURING THIS VISIT:  Medications  sodium chloride 0.9 % bolus 1,000 mL (1,000 mLs Intravenous New  Bag/Given 04/17/18 1400)  iopamidol (ISOVUE-370) 76 % injection 100 mL (66 mLs Intravenous Contrast Given 04/17/18 1336)    Note:  This document was prepared using Dragon voice recognition software and may include unintentional dictation errors.  Nanda Quinton, MD Emergency Medicine    Hiroko Tregre, [facility], MD 04/17/18 [phone]

## 2018-04-17 NOTE — ED Notes (Signed)
Patient is d/c'd at this time - daughter in the room and calling to make appointments from the room. Family and patient told to take her time

## 2018-04-17 NOTE — Discharge Instructions (Signed)
You were seen in the ED today heart palpitations. Your potassium was normal and labs are looking ok. We did a CT scan looking for blood clots and did not find any, however, we did find a lung mass and liver masses which are concerning. I am referring you to an oncologist who practices in this building. Call today to set up an appointment ASAP. Drink plenty of fluids and also follow up with your PCP ASAP. Return to the ED with any chest pain, difficulty breathing, fever, chills, or other concerning symptoms.

## 2018-04-17 NOTE — ED Triage Notes (Signed)
Patient states that she has had palpitations to her heart for the last week and half.

## 2018-04-20 ENCOUNTER — Telehealth: Payer: Self-pay | Admitting: Family Medicine

## 2018-04-20 ENCOUNTER — Ambulatory Visit (INDEPENDENT_AMBULATORY_CARE_PROVIDER_SITE_OTHER): Payer: Medicare PPO

## 2018-04-20 ENCOUNTER — Ambulatory Visit (INDEPENDENT_AMBULATORY_CARE_PROVIDER_SITE_OTHER): Payer: Medicare PPO | Admitting: Family Medicine

## 2018-04-20 ENCOUNTER — Encounter: Payer: Self-pay | Admitting: Family Medicine

## 2018-04-20 VITALS — BP 118/70 | HR 100 | Temp 98.7°F | Ht 67.0 in | Wt 123.0 lb

## 2018-04-20 DIAGNOSIS — G8929 Other chronic pain: Secondary | ICD-10-CM

## 2018-04-20 DIAGNOSIS — M79672 Pain in left foot: Secondary | ICD-10-CM | POA: Diagnosis not present

## 2018-04-20 DIAGNOSIS — M79671 Pain in right foot: Secondary | ICD-10-CM

## 2018-04-20 DIAGNOSIS — M25562 Pain in left knee: Secondary | ICD-10-CM

## 2018-04-20 DIAGNOSIS — I517 Cardiomegaly: Secondary | ICD-10-CM | POA: Diagnosis not present

## 2018-04-20 DIAGNOSIS — Z13220 Encounter for screening for lipoid disorders: Secondary | ICD-10-CM | POA: Diagnosis not present

## 2018-04-20 DIAGNOSIS — M25561 Pain in right knee: Secondary | ICD-10-CM

## 2018-04-20 DIAGNOSIS — R002 Palpitations: Secondary | ICD-10-CM | POA: Diagnosis not present

## 2018-04-20 DIAGNOSIS — E538 Deficiency of other specified B group vitamins: Secondary | ICD-10-CM | POA: Diagnosis not present

## 2018-04-20 DIAGNOSIS — M542 Cervicalgia: Secondary | ICD-10-CM

## 2018-04-20 DIAGNOSIS — Z1211 Encounter for screening for malignant neoplasm of colon: Secondary | ICD-10-CM

## 2018-04-20 DIAGNOSIS — K769 Liver disease, unspecified: Secondary | ICD-10-CM | POA: Insufficient documentation

## 2018-04-20 DIAGNOSIS — R5383 Other fatigue: Secondary | ICD-10-CM | POA: Diagnosis not present

## 2018-04-20 DIAGNOSIS — M79641 Pain in right hand: Secondary | ICD-10-CM

## 2018-04-20 DIAGNOSIS — M2550 Pain in unspecified joint: Secondary | ICD-10-CM | POA: Diagnosis not present

## 2018-04-20 DIAGNOSIS — R911 Solitary pulmonary nodule: Secondary | ICD-10-CM | POA: Insufficient documentation

## 2018-04-20 DIAGNOSIS — M79642 Pain in left hand: Secondary | ICD-10-CM

## 2018-04-20 DIAGNOSIS — G629 Polyneuropathy, unspecified: Secondary | ICD-10-CM | POA: Diagnosis not present

## 2018-04-20 DIAGNOSIS — M1990 Unspecified osteoarthritis, unspecified site: Secondary | ICD-10-CM | POA: Insufficient documentation

## 2018-04-20 MED ORDER — CELECOXIB 200 MG PO CAPS
200.0000 mg | ORAL_CAPSULE | Freq: Two times a day (BID) | ORAL | 0 refills | Status: DC
Start: 2018-04-20 — End: 2018-05-07

## 2018-04-20 MED ORDER — DICLOFENAC SODIUM 1 % TD GEL: TRANSDERMAL | 1 refills | Status: DC

## 2018-04-20 NOTE — Assessment & Plan Note (Signed)
Patient with a single PVC on her EKG during her most recent hospitalization.  Does not have any red flag signs or symptoms.  We will check an echocardiogram to look for structural causes given her marfanoid habitus.  Also check CMET and magnesium level today.

## 2018-04-20 NOTE — Telephone Encounter (Signed)
Patient already has an appointment with oncology on 04/21/2018.

## 2018-04-20 NOTE — Progress Notes (Signed)
Subjective:  [name] [name] is a 67 y.o. female who presents today with a chief complaint of multiple joint pain and to establish care  HPI:  Polyarthralgia, chronic problem Patient with several year history of polyarthralgia involving most of her joints.  Currently has most of her pain in her neck, hands, knees, and feet bilaterally.  She has had this for most of her life.  At one point she was diagnosed with Marfan syndrome.  She feels very stiff in her neck and her other joints.  This gradually improves throughout the day.  Far she knows, she has never been worked up for this before.  She occasionally takes ibuprofen, but no other medications.  Symptoms have worsened significantly over the last several weeks to months.  She has had some joint swelling to the areas of pain.  No other obvious alleviating or aggravating factors.  Fatigue, chronic problem Patient also with several year history of chronic fatigue.  This is worsened significantly over the last few weeks.  She occasionally takes a vitamin D supplement which helps some.  Liver/lung lesions, new problem Patient was seen in the ED a few days ago.  Had a CT scan done at that time to rule out PE, however this also incidentally found a left apical lung lesion and several liver lesions.  Patient has follow-up with oncology tomorrow.  Palpitations, new problem Summary of recent ED visit: Patient presented to the ED on 04/17/2018 with palpitations and lightheadedness.  At that time, symptoms have been present for about 10 days.  Described a fluttering sensation in her chest.  She had an EKG done at that time which did not show any acute changes.  Patient was diagnosed with dehydration and given IV fluid with significant improvement in her symptoms.  She was discharged home.  Symptoms have improved since her ED visit.  She still has an occasional palpitation.  Patient is concerned because she has been told that she had Marfan's in the past.   Reports having an echocardiogram done about 10 to 15 years ago which was normal.  She has had one son that passed away at an early age due to a heart issue.  ROS: Per HPI, otherwise a complete review of systems was negative.   PMH:  The following were reviewed and entered/updated in epic: Past Medical History:  Diagnosis Date  . Fibroid   . GERD (gastroesophageal reflux disease)   . Muscle weakness of lower extremity 01/17/2015  . Osteopenia   . Parkinson disease ([place]) 01/17/2015   Patient Active Problem List   Diagnosis Date Noted  . Fatigue 04/20/2018  . Polyarthralgia 04/20/2018  . Palpitations 04/20/2018  . Cardiomegaly 04/20/2018  . Lesion of liver 04/20/2018  . Lung nodule 04/20/2018  . Muscle weakness of lower extremity 01/17/2015  . Parkinson disease ([place]) 01/17/2015  . Fibroid uterus 11/13/2012  . Benign colonic polyp 10/02/2012   Past Surgical History:  Procedure Laterality Date  . FINGER SURGERY    . MOUTH SURGERY    . TOE SURGERY    . TONSILLECTOMY    . TUBAL LIGATION     x2   Family History  Problem Relation Age of Onset  . Rheum arthritis Mother   . Breast cancer Mother        50's  . Cancer Father        throat  . Breast cancer Sister        68's  . Hypertension Brother   .  Hyperlipidemia Brother   . Heart attack Son        Deceased, 48  . Healthy Daughter   . Parkinsonism Neg Hx     Medications- reviewed and updated Current Outpatient Medications  Medication Sig Dispense Refill  . Ascorbic Acid (VITAMIN C PO) Take 1 tablet by mouth daily.    Marland Kitchen b complex vitamins capsule Take 1 capsule by mouth daily.    . calcium-vitamin D (OSCAL WITH D) 250-125 MG-UNIT per tablet Take 1 tablet by mouth daily.    . Cholecalciferol (VITAMIN D) 2000 UNITS CAPS Take 2,000 Units by mouth daily.    Marland Kitchen glucosamine-chondroitin 500-400 MG tablet Take 1 tablet by mouth daily.    Marland Kitchen ibuprofen (ADVIL,MOTRIN) 200 MG tablet Take 400 mg by mouth every 6 (six) hours as  needed for moderate pain.     . Magnesium Hydroxide (MAGNESIA PO) Take 1 tablet by mouth daily.    . Multiple Vitamin (MULTIVITAMIN) capsule Take 1 capsule by mouth daily.    . Omega-3 Fatty Acids (OMEGA 3 PO) Take 1 tablet by mouth daily.    Marland Kitchen tetrahydrozoline-zinc (VISINE-AC) 0.05-0.25 % ophthalmic solution Place 2 drops into both eyes 4 (four) times daily as needed (allergies).    . TYROSINE PO Take 1 tablet by mouth daily.    . vitamin B-12 (CYANOCOBALAMIN) 1000 MCG tablet Take by mouth.    . betamethasone dipropionate (DIPROLENE) 0.05 % cream Apply topically 2 (two) times daily. (Patient not taking: Reported on 04/20/2018) 30 g 0  . carbidopa-levodopa (SINEMET IR) 25-100 MG tablet Take by mouth.    . celecoxib (CELEBREX) 200 MG capsule Take 1 capsule (200 mg total) by mouth 2 (two) times daily. 60 capsule 0  . diclofenac sodium (VOLTAREN) 1 % GEL Use 4 times daily as needed. 100 g 1  . escitalopram (LEXAPRO) 10 MG tablet Take 1 tablet (10 mg total) by mouth daily. (Patient not taking: Reported on 04/20/2018) 30 tablet 3  . pramipexole (MIRAPEX) 0.125 MG tablet One tablet three times a day for 2 weeks, then take 2 tablets three times a day (Patient not taking: Reported on 04/20/2018) 180 tablet 1   No current facility-administered medications for this visit.     Allergies-reviewed and updated Allergies  Allergen Reactions  . Sulfa Antibiotics Other (See Comments)    Childhood allergy     Social History   Socioeconomic History  . Marital status: Divorced    Spouse name: Not on file  . Number of children: 4  . Years of education: B.A.  . Highest education level: Not on file  Occupational History  . Occupation: SECRETARY  Social Needs  . Financial resource strain: Not on file  . Food insecurity:    Worry: Not on file    Inability: Not on file  . Transportation needs:    Medical: Not on file    Non-medical: Not on file  Tobacco Use  . Smoking status: Never Smoker  . Smokeless  tobacco: Never Used  Substance and Sexual Activity  . Alcohol use: No    Alcohol/week: 0.0 oz  . Drug use: No  . Sexual activity: Never  Lifestyle  . Physical activity:    Days per week: Not on file    Minutes per session: Not on file  . Stress: Not on file  Relationships  . Social connections:    Talks on phone: Not on file    Gets together: Not on file    Attends religious  service: Not on file    Active member of club or organization: Not on file    Attends meetings of clubs or organizations: Not on file    Relationship status: Not on file  Other Topics Concern  . Not on file  Social History Narrative   Patient is right handed.   Patient drinks 2-3 cups of caffeine per day.   Lives alone in a 2 story home.  Has 4 living children.  1 passed away at age 25.     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 Soft, Nontender, Nondistended. MSK:  -Neck: No deformities.  Limited range of motion with leftward and rightward rotation. - Upper extremities: Hands with ulnar deviation of digits bilaterally.  First CMC joint significantly hypertrophied bilaterally.  Neurovascularly intact distally. -Lower extremities: Bilateral knees with crepitus on active range of motion.  No other deformities with knees.  Bilateral feet with significant shortening of lateral metacarpal/phalangeal rays.  Lateral deviation of digits noted. Skin: Warm, dry Neuro: Grossly normal, moves all extremities Psych: Normal affect and thought content  Assessment/Plan:    Polyarthralgia Patient with marfanoid habitus.  This is likely contributing to her polyarthralgia.  She also has significant ulnar deviation in her hands bilaterally.  We will check for rheumatologic causes of her polyarthralgia today.  Check ANA and rheumatoid factor.  We will check plain film of her neck, bilateral hands, bilateral knees, and bilateral feet.  Start Celebrex and Voltaren today.  Follow-up with me in 4 weeks.  May ultimately need referral to rheumatology or orthopedics depending on results of above.  Palpitations Patient with a single PVC on her EKG during her most recent hospitalization.  Does not have any red flag signs or symptoms.  We will check an echocardiogram to look for structural causes given her marfanoid habitus.  Also check CMET and magnesium level today.  Lung nodule Concern for metastatic disease.  Patient has follow-up with oncology tomorrow.  Will make sure patient is up-to-date on her cancer screening.  Lesion of liver Concern for metastatic disease.  Has oncology follow-up tomorrow.  Fatigue Unclear etiology.  Check TSH, CBC, B12, and magnesium level today.  Cardiomegaly Incidentally noted on her CT scan.  Will check echocardiogram as noted above.  Preventative Healthcare Check lipid panel with blood draw.  Referral to GI placed for colonoscopy.  Instructed patient to get mammogram soon.  Patient has a HIGH level of medical complexity due to number of diagnoses/treatment options and amount/complexity of data reviewed.   Algis Greenhouse. Jerline Pain, MD 04/20/2018 3:30 PM

## 2018-04-20 NOTE — Assessment & Plan Note (Signed)
Concern for metastatic disease.  Has oncology follow-up tomorrow.

## 2018-04-20 NOTE — Telephone Encounter (Signed)
See note.  Copied from [place] [id number]. Topic: Referral - Request >> Apr 20, 2018  4:26 PM Oliver Pila B wrote: Reason for CRM: pt would like to be referred to an Oncologist, call pt to advise

## 2018-04-20 NOTE — Assessment & Plan Note (Signed)
Incidentally noted on her CT scan.  Will check echocardiogram as noted above.

## 2018-04-20 NOTE — Assessment & Plan Note (Signed)
Patient with marfanoid habitus.  This is likely contributing to her polyarthralgia.  She also has significant ulnar deviation in her hands bilaterally.  We will check for rheumatologic causes of her polyarthralgia today.  Check ANA and rheumatoid factor.  We will check plain film of her neck, bilateral hands, bilateral knees, and bilateral feet.  Start Celebrex and Voltaren today.  Follow-up with me in 4 weeks.  May ultimately need referral to rheumatology or orthopedics depending on results of above.

## 2018-04-20 NOTE — Patient Instructions (Addendum)
It was very nice to see you today!  Please keep your appointment with Dr. Marin Olp tomorrow.  We will check x-rays of your neck, hands, knees, and feet today.  We will also check blood work to make sure you do not have lupus or rheumatoid arthritis.  We will check for other causes of your fatigue including her B12 level and thyroid level.  We will check an echocardiogram to evaluate your heart.  Please start the Celebrex and Voltaren.  Come back in 4 weeks, or sooner as needed.  I will place a referral for your colonoscopy. Please schedule your mammogram.  Take care, Dr Jerline Pain

## 2018-04-20 NOTE — Assessment & Plan Note (Signed)
Concern for metastatic disease.  Patient has follow-up with oncology tomorrow.  Will make sure patient is up-to-date on her cancer screening.

## 2018-04-20 NOTE — Assessment & Plan Note (Signed)
Unclear etiology.  Check TSH, CBC, B12, and magnesium level today.

## 2018-04-21 ENCOUNTER — Inpatient Hospital Stay: Payer: Medicare PPO

## 2018-04-21 ENCOUNTER — Encounter: Payer: Self-pay | Admitting: *Deleted

## 2018-04-21 ENCOUNTER — Other Ambulatory Visit: Payer: Self-pay

## 2018-04-21 ENCOUNTER — Inpatient Hospital Stay: Payer: Medicare PPO | Attending: Hematology & Oncology | Admitting: Hematology & Oncology

## 2018-04-21 VITALS — BP 127/80 | HR 89 | Temp 98.1°F | Resp 17 | Wt 122.2 lb

## 2018-04-21 DIAGNOSIS — M8589 Other specified disorders of bone density and structure, multiple sites: Secondary | ICD-10-CM | POA: Diagnosis not present

## 2018-04-21 DIAGNOSIS — K769 Liver disease, unspecified: Secondary | ICD-10-CM

## 2018-04-21 DIAGNOSIS — R918 Other nonspecific abnormal finding of lung field: Secondary | ICD-10-CM | POA: Diagnosis not present

## 2018-04-21 DIAGNOSIS — G20 Parkinson's disease: Secondary | ICD-10-CM | POA: Diagnosis not present

## 2018-04-21 LAB — CMP (CANCER CENTER ONLY)
ALT: 18 U/L (ref 10–47)
AST: 21 U/L (ref 11–38)
Albumin: 4.4 g/dL (ref 3.5–5.0)
Alkaline Phosphatase: 50 U/L (ref 26–84)
Anion gap: 5 (ref 5–15)
BUN: 15 mg/dL (ref 7–22)
CO2: 30 mmol/L (ref 18–33)
Calcium: 9.7 mg/dL (ref 8.0–10.3)
Chloride: 107 mmol/L (ref 98–108)
Creatinine: 0.7 mg/dL (ref 0.60–1.20)
Glucose, Bld: 75 mg/dL (ref 73–118)
Potassium: 4.2 mmol/L (ref 3.3–4.7)
Sodium: 142 mmol/L (ref 128–145)
Total Bilirubin: 0.8 mg/dL (ref 0.2–1.6)
Total Protein: 7.3 g/dL (ref 6.4–8.1)

## 2018-04-21 LAB — COMPREHENSIVE METABOLIC PANEL
ALT: 10 U/L (ref 0–35)
AST: 14 U/L (ref 0–37)
Albumin: 4.4 g/dL (ref 3.5–5.2)
Alkaline Phosphatase: 48 U/L (ref 39–117)
BUN: 16 mg/dL (ref 6–23)
CO2: 27 mEq/L (ref 19–32)
Calcium: 10 mg/dL (ref 8.4–10.5)
Chloride: 104 mEq/L (ref 96–112)
Creatinine, Ser: 0.73 mg/dL (ref 0.40–1.20)
GFR: 101.91 mL/min (ref >60.00)
Glucose, Bld: 81 mg/dL (ref 70–99)
Potassium: 3.8 mEq/L (ref 3.5–5.1)
Sodium: 141 mEq/L (ref 135–145)
Total Bilirubin: 0.5 mg/dL (ref 0.2–1.2)
Total Protein: 7.2 g/dL (ref 6.0–8.3)

## 2018-04-21 LAB — CBC WITH DIFFERENTIAL (CANCER CENTER ONLY)
Basophils Absolute: 0 10*3/uL (ref 0.0–0.1)
Basophils Relative: 0 %
Eosinophils Absolute: 0.2 10*3/uL (ref 0.0–0.5)
Eosinophils Relative: 3 %
HCT: 39.6 % (ref 34.8–46.6)
Hemoglobin: 13.1 g/dL (ref 11.6–15.9)
Lymphocytes Relative: 23 %
Lymphs Abs: 1.4 10*3/uL (ref 0.9–3.3)
MCH: 29.3 pg (ref 26.0–34.0)
MCHC: 33.1 g/dL (ref 32.0–36.0)
MCV: 88.6 fL (ref 81.0–101.0)
Monocytes Absolute: 0.5 10*3/uL (ref 0.1–0.9)
Monocytes Relative: 9 %
Neutro Abs: 3.8 10*3/uL (ref 1.5–6.5)
Neutrophils Relative %: 65 %
Platelet Count: 164 10*3/uL (ref 145–400)
RBC: 4.47 MIL/uL (ref 3.70–5.32)
RDW: 13 % (ref 11.1–15.7)
WBC Count: 5.9 10*3/uL (ref 3.9–10.0)

## 2018-04-21 LAB — LIPID PANEL
Cholesterol: 162 mg/dL (ref 0–200)
HDL: 68.1 mg/dL (ref >39.00)
LDL Cholesterol: 85 mg/dL (ref 0–99)
NonHDL: 93.84
Total CHOL/HDL Ratio: 2
Triglycerides: 44 mg/dL (ref 0.0–149.0)
VLDL: 8.8 mg/dL (ref 0.0–40.0)

## 2018-04-21 LAB — CBC
HCT: 39.1 % (ref 36.0–46.0)
Hemoglobin: 13.2 g/dL (ref 12.0–15.0)
MCHC: 33.7 g/dL (ref 30.0–36.0)
MCV: 88.1 fl (ref 78.0–100.0)
Platelets: 146 10*3/uL — ABNORMAL LOW (ref 150.0–400.0)
RBC: 4.44 Mil/uL (ref 3.87–5.11)
RDW: 13.3 % (ref 11.5–15.5)
WBC: 5.4 10*3/uL (ref 4.0–10.5)

## 2018-04-21 LAB — TSH: TSH: 0.64 u[IU]/mL (ref 0.35–4.50)

## 2018-04-21 LAB — VITAMIN B12: Vitamin B-12: >1500 pg/mL — ABNORMAL HIGH (ref 211–911)

## 2018-04-21 LAB — LACTATE DEHYDROGENASE: LDH: 159 U/L (ref 98–192)

## 2018-04-21 LAB — MAGNESIUM: Magnesium: 1.9 mg/dL (ref 1.5–2.5)

## 2018-04-21 NOTE — Addendum Note (Signed)
Addended by: Volanda Napoleon on: 04/21/2018 06:24 PM   Modules accepted: Orders

## 2018-04-21 NOTE — Progress Notes (Signed)
Referral MD  Reason for Referral: Hepatic masses  No chief complaint on file. : I have tumors in my liver.  HPI: [name] [name] is a very nice 67 year old African-American female.  She has a movement disorder which is similar to Parkinson's disease.  She is on Sinemet.  She has not had a mammogram in about 3 or 4 years.  There is a history of breast cancer in a sister and also with her mother.  She says that her sister had breast cancer when she was 62 years old.  She apparently had some chest discomfort.  I think she may have had some palpitations.  She subsequently was taken to the emergency room at [facility].  She had an elevated d-dimer.  She had a normal chest x-ray.  A CT scan was done but there was no pulmonary embolism.  However, there was noted to be a nodule in the lung.  This was only 3 mm in the left lung.  However she had multiple liver lesions.  There is no obvious bony metastasis.  There is no obvious adenopathy.  She subsequently was referred to the [facility] for an evaluation.  She has never smoked.  She does not drink.  She has not lost weight.  She eats 6 small meals a day.  There is no history of diabetes.  She has had no bleeding.  There is no change in bowel or bladder habits.  She had a colonoscopy about 9 years ago.  There is been no rashes.  Overall, her performance status is ECOG 2.   Past Medical History:  Diagnosis Date  . Fibroid   . GERD (gastroesophageal reflux disease)   . Muscle weakness of lower extremity 01/17/2015  . Osteopenia   . Parkinson disease ([place]) 01/17/2015  :  Past Surgical History:  Procedure Laterality Date  . FINGER SURGERY    . MOUTH SURGERY    . TOE SURGERY    . TONSILLECTOMY    . TUBAL LIGATION     x2  :   Current Outpatient Medications:  .  Ascorbic Acid (VITAMIN C PO), Take 1 tablet by mouth daily., Disp: , Rfl:  .  b complex vitamins capsule, Take 1 capsule by mouth daily., Disp: , Rfl:   .  betamethasone dipropionate (DIPROLENE) 0.05 % cream, Apply topically 2 (two) times daily., Disp: 30 g, Rfl: 0 .  calcium-vitamin D (OSCAL WITH D) 250-125 MG-UNIT per tablet, Take 1 tablet by mouth daily., Disp: , Rfl:  .  celecoxib (CELEBREX) 200 MG capsule, Take 1 capsule (200 mg total) by mouth 2 (two) times daily., Disp: 60 capsule, Rfl: 0 .  Cholecalciferol (VITAMIN D) 2000 UNITS CAPS, Take 2,000 Units by mouth daily., Disp: , Rfl:  .  diclofenac sodium (VOLTAREN) 1 % GEL, Use 4 times daily as needed., Disp: 100 g, Rfl: 1 .  escitalopram (LEXAPRO) 10 MG tablet, Take 1 tablet (10 mg total) by mouth daily., Disp: 30 tablet, Rfl: 3 .  glucosamine-chondroitin 500-400 MG tablet, Take 1 tablet by mouth daily., Disp: , Rfl:  .  ibuprofen (ADVIL,MOTRIN) 200 MG tablet, Take 400 mg by mouth every 6 (six) hours as needed for moderate pain. , Disp: , Rfl:  .  Magnesium Hydroxide (MAGNESIA PO), Take 1 tablet by mouth daily., Disp: , Rfl:  .  Multiple Vitamin (MULTIVITAMIN) capsule, Take 1 capsule by mouth daily., Disp: , Rfl:  .  Omega-3 Fatty Acids (OMEGA 3 PO), Take  1 tablet by mouth daily., Disp: , Rfl:  .  tetrahydrozoline-zinc (VISINE-AC) 0.05-0.25 % ophthalmic solution, Place 2 drops into both eyes 4 (four) times daily as needed (allergies)., Disp: , Rfl:  .  TYROSINE PO, Take 1 tablet by mouth daily., Disp: , Rfl:  .  vitamin B-12 (CYANOCOBALAMIN) 1000 MCG tablet, Take by mouth., Disp: , Rfl: :  :  Allergies  Allergen Reactions  . Sulfa Antibiotics Other (See Comments)    Childhood allergy   :  Family History  Problem Relation Age of Onset  . Rheum arthritis Mother   . Breast cancer Mother        51's  . Cancer Father        throat  . Breast cancer Sister        48's  . Hypertension Brother   . Hyperlipidemia Brother   . Heart attack Son        Deceased, 81  . Healthy Daughter   . Parkinsonism Neg Hx   :  Social History   Socioeconomic History  . Marital status: Divorced     Spouse name: Not on file  . Number of children: 4  . Years of education: B.A.  . Highest education level: Not on file  Occupational History  . Occupation: SECRETARY  Social Needs  . Financial resource strain: Not on file  . Food insecurity:    Worry: Not on file    Inability: Not on file  . Transportation needs:    Medical: Not on file    Non-medical: Not on file  Tobacco Use  . Smoking status: Never Smoker  . Smokeless tobacco: Never Used  Substance and Sexual Activity  . Alcohol use: No    Alcohol/week: 0.0 oz  . Drug use: No  . Sexual activity: Never  Lifestyle  . Physical activity:    Days per week: Not on file    Minutes per session: Not on file  . Stress: Not on file  Relationships  . Social connections:    Talks on phone: Not on file    Gets together: Not on file    Attends religious service: Not on file    Active member of club or organization: Not on file    Attends meetings of clubs or organizations: Not on file    Relationship status: Not on file  . Intimate partner violence:    Fear of current or ex partner: Not on file    Emotionally abused: Not on file    Physically abused: Not on file    Forced sexual activity: Not on file  Other Topics Concern  . Not on file  Social History Narrative   Patient is right handed.   Patient drinks 2-3 cups of caffeine per day.   Lives alone in a 2 story home.  Has 4 living children.  1 passed away at age 54.     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   HENT: Negative.   Eyes: Negative.   Respiratory: Negative.   Cardiovascular: Positive for chest pain and palpitations.  Gastrointestinal: Negative.   Genitourinary: Negative.   Musculoskeletal: Negative.   Skin: Negative.   Neurological: Positive for focal weakness.  Endo/Heme/Allergies: Negative.   Psychiatric/Behavioral: Negative.       Exam: Thin African-American female in no obvious distress.  Vital signs show temperature of 98.1.  Pulse  89.  Blood pressure 127/80.  Weight is 122 pounds.  Head neck exam shows no ocular or oral lesions.  She has no scleral icterus.  She has no adenopathy in the neck.  Thyroid is nonpalpable.  Lungs are clear bilaterally.  Cardiac exam regular rate and rhythm with no murmurs, rubs or bruits.  Abdomen is soft.  She has good bowel sounds.  There is no fluid wave.  There is no palpable liver or spleen tip.  Breast exam shows some slight fullness in the right breast.  She may have some fullness in the left axilla.  Extremities shows no clubbing, cyanosis or edema.  She has decent range of motion of her joints.  She has good strength bilaterally.  Skin exam shows no rashes, ecchymoses or petechia.  Neurological exam shows no obvious tremors. @IPVITALS @   Recent Labs    04/20/18 1514 04/21/18 1345  WBC 5.4 5.9  HGB 13.2 13.1  HCT 39.1 39.6  PLT 146.0* 164   Recent Labs    04/20/18 1514 04/21/18 1345  NA 141 142  K 3.8 4.2  CL 104 107  CO2 27 30  GLUCOSE 81 75  BUN 16 15  CREATININE 0.73 0.70  CALCIUM 10.0 9.7    Blood smear review: None  Pathology: None    Assessment and Plan: [name] [name] is a very charming 67 year old African-American female.  She has hepatic masses.  She has a tiny lung nodule.  I do have to worry about breast cancer given this family history.  The fact that she has not had a a mammogram in 3 years is also a little trouble some.  We will have to go for the liver.  We will get her set up with a liver biopsy.  She has never smoked.  I would not think that she has lung cancer.  I would not think that this is any type of hematologic malignancy.  I do not see anything that looks like a primary multifocal hepatic carcinoma.  She has no risk factors for hepatocellular carcinoma.  I spent about an hour with she and her daughter.  All the time was spent  face-to-face.  I reviewed the CT scans.  I reviewed the lab work.  I went over my recommendations.  I answered all their questions.  We had a very good prayer session.  I gave her a prayer blanket which she very much appreciated.  We will plan to have her come back once we get the results from the biopsy.  I would not order any other scans right now until I have a conclusive proof of malignancy.

## 2018-04-22 LAB — CA 125: Cancer Antigen (CA) 125: 37.2 U/mL (ref 0.0–38.1)

## 2018-04-22 LAB — RHEUMATOID FACTOR: Rhuematoid fact SerPl-aCnc: <14 IU/mL (ref <14)

## 2018-04-22 LAB — ANA: Anti Nuclear Antibody(ANA): NEGATIVE

## 2018-04-22 LAB — CEA (IN HOUSE-CHCC): CEA (CHCC-In House): <1 ng/mL (ref 0.00–5.00)

## 2018-04-23 ENCOUNTER — Telehealth: Payer: Self-pay | Admitting: Family Medicine

## 2018-04-23 NOTE — Telephone Encounter (Signed)
Patient would like to know if when she last had her complete blood count done if her vitamin D was checked. Call patient to follow up and discuss at [phone].

## 2018-04-24 NOTE — Telephone Encounter (Signed)
We did not draw a vitamin D level.  Attempted to call patient and could not reach her.  There was silence on the other end of the line.  Please see lab result note as well.  I was trying to give lab results.  CRM was started for that as well.  Ok to give lab results.

## 2018-04-28 NOTE — Telephone Encounter (Signed)
Attempted to call patient again with no success.  After ringing several times, there is just silence on the other end and then the phone goes dead.

## 2018-05-01 ENCOUNTER — Ambulatory Visit: Payer: Medicare PPO | Admitting: Physical Therapy

## 2018-05-04 NOTE — Telephone Encounter (Signed)
I have addressed this question in the lab result letter I mailed to the patient as I have tried to contact her several times with no success.

## 2018-05-07 ENCOUNTER — Encounter: Payer: Self-pay | Admitting: Family Medicine

## 2018-05-07 ENCOUNTER — Ambulatory Visit: Payer: Medicare PPO | Admitting: Family Medicine

## 2018-05-07 ENCOUNTER — Ambulatory Visit (INDEPENDENT_AMBULATORY_CARE_PROVIDER_SITE_OTHER): Payer: Medicare PPO | Admitting: Physical Therapy

## 2018-05-07 ENCOUNTER — Encounter: Payer: Self-pay | Admitting: Physical Therapy

## 2018-05-07 DIAGNOSIS — M159 Polyosteoarthritis, unspecified: Secondary | ICD-10-CM

## 2018-05-07 DIAGNOSIS — M6281 Muscle weakness (generalized): Secondary | ICD-10-CM | POA: Diagnosis not present

## 2018-05-07 DIAGNOSIS — M150 Primary generalized (osteo)arthritis: Secondary | ICD-10-CM

## 2018-05-07 DIAGNOSIS — F419 Anxiety disorder, unspecified: Secondary | ICD-10-CM

## 2018-05-07 DIAGNOSIS — R2689 Other abnormalities of gait and mobility: Secondary | ICD-10-CM

## 2018-05-07 DIAGNOSIS — M8949 Other hypertrophic osteoarthropathy, multiple sites: Secondary | ICD-10-CM

## 2018-05-07 MED ORDER — DICLOFENAC SODIUM 1 % TD GEL: TRANSDERMAL | 1 refills | Status: DC

## 2018-05-07 MED ORDER — CITALOPRAM HYDROBROMIDE 10 MG PO TABS
10.0000 mg | ORAL_TABLET | Freq: Every day | ORAL | 1 refills | Status: DC
Start: 2018-05-07 — End: 2019-03-02

## 2018-05-07 MED ORDER — CELECOXIB 200 MG PO CAPS: 200.0000 mg | ORAL_CAPSULE | Freq: Two times a day (BID) | ORAL | 0 refills | Status: DC

## 2018-05-07 NOTE — Patient Instructions (Addendum)
It was very nice to see you today!  You have arthritis in your joints. This is causing your pain. Please take the celebrex and use the voltaren gel.  We will start celexa today. Please take 1 pill daily.  Come back to see me in 4-6 weeks, or sooner as needed.  Take care, Dr Jerline Pain

## 2018-05-07 NOTE — Patient Instructions (Signed)
Access Code: [email or web address]  URL: [email or web address]  Date: 05/07/2018  Prepared by: Lyndee Hensen   Exercises  Supine Active Ankle Pumps - 10 reps - 2 sets - 2x daily  Supine Ankle Inversion Eversion AROM - 10 reps - 2 sets - 2x daily  Supine Quad Set - 10 reps - 2 sets - 2x daily  Supine Heel Slides - 10 reps - 2 sets - 2x daily  Seated Knee Extension AROM - 10 reps - 2 sets - 2x daily  Sit to Stand with Armchair - 10 reps - 2 sets - 2x daily

## 2018-05-07 NOTE — Progress Notes (Signed)
   Subjective:  [name] [name] is a 67 y.o. female who presents today with a chief complaint of polyarthralgia follow up.   HPI:  Polyarthralgia, chronic problem Seen about 3 weeks ago for this.  Had extensive work-up including blood work which is negative.  Had multiple x-rays performed which confirmed osteoarthritis.  She was started on Voltaren gel and Celebrex, however has not started any of these medications yet.  Her pain is stable.  Anxiety, new problem to provider Several year history.  Worsened recently over the past few weeks.  She has tried herbal treatments in the past which have helped a little bit.  No obvious alleviating or aggravating factors.  No other treatments tried.   Depression screen [facility] 2/9 05/07/2018  Decreased Interest 0  Down, Depressed, Hopeless 0  PHQ - 2 Score 0  Altered sleeping 0  Tired, decreased energy 1  Change in appetite 0  Feeling bad or failure about yourself  0  Trouble concentrating 1  Moving slowly or fidgety/restless 1  Suicidal thoughts 0  PHQ-9 Score 3  Difficult doing work/chores Somewhat difficult   GAD 7 : Generalized Anxiety Score 05/07/2018  Nervous, Anxious, on Edge 3  Control/stop worrying 1  Worry too much - different things 0  Trouble relaxing 3  Restless 1  Easily annoyed or irritable 0  Afraid - awful might happen 0  Total GAD 7 Score 8  Anxiety Difficulty Very difficult   ROS: Per HPI  PMH: She reports that she has never smoked. She has never used smokeless tobacco. She reports that she does not drink alcohol or use drugs.  Objective:  Physical Exam: BP 126/78 (BP Location: Left Arm, Patient Position: Sitting, Cuff Size: Normal)   Pulse 91   Temp 98.2 F (36.8 C) (Oral)   Ht 5\' 7"  (1.702 m)   Wt 124 lb (56.2 kg)   SpO2 99%   BMI 19.42 kg/m   Gen: NAD, resting comfortably CV: RRR with no murmurs appreciated Pulm: NWOB, CTAB with no crackles, wheezes, or rhonchi  Assessment/Plan:  Osteoarthritis Multiple  joint pain secondary to osteoarthritis.  Discussed results of her blood work and x-rays with her today.  She will be following up with physical therapy later today.  She will also start Celebrex as well as Voltaren gel.  Discussed potential treatment options including injections in the future.  Would consider referral to sports medicine or orthopedics depending on her response to celebrex and voltaren.   Anxiety GAD significantly elevated today.  Discussed treatment options with patient.  We will start Celexa 10 mg daily.  She will follow-up with me in 4 to 6 weeks.  Discussed reasons to return to care earlier.  Algis Greenhouse. Jerline Pain, MD 05/07/2018 4:28 PM

## 2018-05-07 NOTE — Therapy (Signed)
[facility] [address] [place], [place], [address] Phone: [phone]   Fax:  [phone]  Physical Therapy Evaluation  Patient Details  Name: [name] [name] MRN: [id number] Date of Birth: September 25, 1951 Referring Provider: Dimas Chyle   Encounter Date: 05/07/2018  PT End of Session - 05/07/18 1617    Visit Number  1    Number of Visits  16    Date for PT Re-Evaluation  07/02/18    Authorization Type  [employer]    PT Start Time  1520    PT Stop Time  1614    PT Time Calculation (min)  54 min    Activity Tolerance  Patient tolerated treatment well    Behavior During Therapy  [facility] for tasks assessed/performed       Past Medical History:  Diagnosis Date  . Fibroid   . GERD (gastroesophageal reflux disease)   . Muscle weakness of lower extremity 01/17/2015  . Osteopenia   . Parkinson disease ([place]) 01/17/2015    Past Surgical History:  Procedure Laterality Date  . FINGER SURGERY    . MOUTH SURGERY    . TOE SURGERY    . TONSILLECTOMY    . TUBAL LIGATION     x2    There were no vitals filed for this visit.   Subjective Assessment - 05/07/18 1523    Subjective  Lives Alone, no stairs, uses SPC. Does drive. No Falls. No HEP for LEs. Does have pain in shoulders, neck, feet, knees, hands, No pain in back or hips. Recently cleared for any Cancer from oncologist, she was checked for spot on liver. Also had recent heart palpitations, deemed to not be heart related, just anxiety related., going to have follow up Echo. Pt states multiple pain locations, but is most concerned with her LE weakness, poor balance, and poor walking. She wishes to focus on this for her PT. She and son state that she her overall mobility has declinced recently.    Patient is accompained by:  Family member    Limitations  Lifting;Standing;Walking;House hold activities    Patient Stated Goals  Improve LE strength, balance, walking.     Currently in Pain?  Yes    Pain Location  -- Generalized (shoulders, and legs)     Pain Orientation  Right;Left    Pain Descriptors / Indicators  Aching    Pain Type  Chronic pain    Pain Onset  More than a month ago    Pain Frequency  Intermittent         [facility] PT Assessment - 05/07/18 0001      Assessment   Medical Diagnosis  Polyarthralgia    Referring Provider  Dimas Chyle    Hand Dominance  Right    Prior Therapy  No      Precautions   Precautions  None      Balance Screen   Has the patient fallen in the past 6 months  No      Prior Function   Level of Independence  Independent      Cognition   Overall Cognitive Status  Within Functional Limits for tasks assessed      ROM / Strength   AROM / PROM / Strength  AROM;Strength      AROM   Overall AROM Comments  Shoulders: WFL; knees: mild limitations;  Hips: mild limitations;  Ankles: mild/moderate limitations;  Lumbar: moderate limitations;       Strength  Overall Strength Comments  Shoulders: 4-/5; Hips: 3/5; Knees: 4-/5; Ankles: 4-/5;       Ambulation/Gait   Gait Comments   increased knee flexion with initial contact, with mid foot contact, inability for TKE or heel strike; Carrying [facility]      Balance   Balance Assessed  Yes Standing dynamic: fair minus     * Formal balance testing to follow at next visit*           Objective measurements completed on examination: See above findings.      [facility] Adult PT Treatment/Exercise - 05/07/18 0001      Exercises   Exercises  Knee/Hip      Knee/Hip Exercises: Seated   Long Arc Quad  10 reps;Both    Sit to [employer]  -- 3 reps      Knee/Hip Exercises: Supine   Quad Sets  10 reps;Both    Heel Slides  10 reps;Both    Other Supine Knee/Hip Exercises  Ankle 4 way AROM x10 each bil;              PT Education - 05/07/18 1603    Education Details  HEP, PT POC    Person(s) Educated  Patient;Child(ren)    Methods  Explanation;Handout;Verbal cues;Tactile cues    Comprehension   Verbalized understanding       PT Short Term Goals - 05/07/18 1624      PT SHORT TERM GOAL #1   Title  Pt to be independent with initial HEP     Time  3    Period  Weeks    Status  New    Target Date  05/28/18      PT SHORT TERM GOAL #2   Title  Pt to demo ability for sit to stand from chair on first attempt, with proper mechanics, and use of 1 UE only.     Time  3    Period  Weeks    Status  New    Target Date  05/28/18      PT SHORT TERM GOAL #3   Title  Pt will demo ability for correct use/sequencing for use of SPC with gait, to decrease fall risk.     Time  3    Period  Weeks    Status  New    Target Date  05/28/18        PT Long Term Goals - 05/07/18 1626      PT LONG TERM GOAL #1   Title  Pt to demo improved TUG score by at least 3 seconds (from baseline) to improve fall risk.     Time  8    Period  Weeks    Status  New    Target Date  07/02/18      PT LONG TERM GOAL #2   Title  Pt to demo improved strength of Bil LEs to at least 4/5 to improve stability, pain, and gait.     Time  8    Period  Weeks    Status  New    Target Date  07/02/18      PT LONG TERM GOAL #3   Title  Pt to demo improved mechanics for ambulation, to be [facility] for pt age and dx, with improved presence of TKE , heel strike, speed, with balance WFL, and use of LRAD.     Time  8    Period  Weeks    Status  New  Target Date  07/02/18             Plan - 05/07/18 1618    Clinical Impression Statement  Pt presents with primary complaint of weakness in LEs and poor balance. She has polyarthralgia dx as well as marfan syndrome, and does have multiple sites of pain, in most all joints. She does have weakness in UE/shoulder, as well as LEs. She has stiffness and mild ROM loss for ankles, knees, hips, back, and shoulders. She has decreased dynamic balance, standing= fair minus. She has poor gait mechanics and endurance, and fatigues easily. She has increased knee flexion with initial  contact, with mid foot contact, inability for TKE or heel strike. She is carrying SPC, but not using effectively to assist with balance. Pt with slow, cautious gait that is inefficient. She also has ineffective HEP for Dx. Pt with decreased abiltiy for sustaining standing activities, functional mobility, and community navigation. Pt to benefit from skilled care to improve deficits. Althought pt has multiple pain locations, main focus will be on LE mobility, strength, balance, and gait to improve functional mobility. Formal balance testing to follow next visit.     Clinical Presentation  Stable    Clinical Decision Making  Moderate    Rehab Potential  Good    PT Frequency  2x / week    PT Duration  8 weeks    PT Treatment/Interventions  ADLs/Self Care Home Management;Cryotherapy;Electrical Stimulation;Moist Heat;Therapeutic activities;Functional mobility training;Stair training;Gait training;DME Instruction;Ultrasound;Therapeutic exercise;Balance training;Neuromuscular re-education;Patient/family education;Orthotic Fit/Training;Dry needling;Passive range of motion;Manual techniques;Taping    Consulted and Agree with Plan of Care  Patient       Patient will benefit from skilled therapeutic intervention in order to improve the following deficits and impairments:  Abnormal gait, Decreased endurance, Hypomobility, Decreased activity tolerance, Decreased knowledge of use of DME, Decreased strength, Impaired UE functional use, Pain, Increased muscle spasms, Difficulty walking, Decreased mobility, Decreased balance, Decreased range of motion, Impaired perceived functional ability, Improper body mechanics, Impaired flexibility, Decreased safety awareness, Decreased coordination  Visit Diagnosis: Other abnormalities of gait and mobility  Muscle weakness (generalized)     Problem List Patient Active Problem List   Diagnosis Date Noted  . Anxiety 05/07/2018  . Fatigue 04/20/2018  . Osteoarthritis  04/20/2018  . Palpitations 04/20/2018  . Cardiomegaly 04/20/2018  . Lesion of liver 04/20/2018  . Lung nodule 04/20/2018  . Muscle weakness of lower extremity 01/17/2015  . Parkinson disease ([place]) 01/17/2015  . Fibroid uterus 11/13/2012  . Benign colonic polyp 10/02/2012     Lyndee Hensen, PT, DPT 4:41 PM  05/07/18   [facility] [place], [place], [address] Phone: [phone]   Fax:  [phone]  Name: [name] [name] MRN: [id number] Date of Birth: November 23, 1950

## 2018-05-07 NOTE — Assessment & Plan Note (Signed)
GAD significantly elevated today.  Discussed treatment options with patient.  We will start Celexa 10 mg daily.  She will follow-up with me in 4 to 6 weeks.  Discussed reasons to return to care earlier.

## 2018-05-07 NOTE — Assessment & Plan Note (Signed)
Multiple joint pain secondary to osteoarthritis.  Discussed results of her blood work and x-rays with her today.  She will be following up with physical therapy later today.  She will also start Celebrex as well as Voltaren gel.  Discussed potential treatment options including injections in the future.  Would consider referral to sports medicine or orthopedics depending on her response to celebrex and voltaren.

## 2018-05-11 NOTE — Telephone Encounter (Signed)
Pt given results per notes of Dr. Jerline Pain on 04/23/18. Pt also notified that Vit D was not drawn with her other lab work.Pt verbalized understanding.Unable to document in result note. Pt states she would like to be referred to sports medicine or an orthopedic doctor. Pt also inquiring about having a echocardiogram as discussed previously with Dr. Jerline Pain.

## 2018-05-12 ENCOUNTER — Other Ambulatory Visit: Payer: Self-pay

## 2018-05-12 DIAGNOSIS — M8949 Other hypertrophic osteoarthropathy, multiple sites: Secondary | ICD-10-CM

## 2018-05-12 DIAGNOSIS — M159 Polyosteoarthritis, unspecified: Secondary | ICD-10-CM

## 2018-05-12 DIAGNOSIS — M150 Primary generalized (osteo)arthritis: Principal | ICD-10-CM

## 2018-05-12 NOTE — Telephone Encounter (Signed)
Please advise.  Better to refer to sports med or orthopedics?

## 2018-05-12 NOTE — Telephone Encounter (Signed)
Referral to sports medicine has been placed.

## 2018-05-12 NOTE — Telephone Encounter (Signed)
Ok to refer to sports med. Echo order was placed a few weeks ago - may need to check on status.  [name] [name]. [name] Pain, MD 05/12/2018 8:20 AM

## 2018-05-13 ENCOUNTER — Encounter: Payer: Medicare PPO | Admitting: Physical Therapy

## 2018-05-14 ENCOUNTER — Ambulatory Visit: Payer: Medicare PPO | Admitting: Family Medicine

## 2018-05-18 ENCOUNTER — Ambulatory Visit: Payer: Medicare Other | Admitting: Family Medicine

## 2018-05-19 ENCOUNTER — Encounter: Payer: Medicare PPO | Admitting: Physical Therapy

## 2018-05-21 ENCOUNTER — Encounter: Payer: Self-pay | Admitting: Physical Therapy

## 2018-05-21 ENCOUNTER — Ambulatory Visit (INDEPENDENT_AMBULATORY_CARE_PROVIDER_SITE_OTHER): Payer: Medicare PPO | Admitting: Physical Therapy

## 2018-05-21 DIAGNOSIS — M6281 Muscle weakness (generalized): Secondary | ICD-10-CM | POA: Diagnosis not present

## 2018-05-21 DIAGNOSIS — R2689 Other abnormalities of gait and mobility: Secondary | ICD-10-CM

## 2018-05-21 NOTE — Therapy (Signed)
[facility] [address] [place], [place], [address] Phone: [phone]   Fax:  [phone]  Physical Therapy Treatment  Patient Details  Name: [name] [name] MRN: [id number] Date of Birth: 02/23/1951 Referring Provider: Dimas Chyle   Encounter Date: 05/21/2018  PT End of Session - 05/21/18 1616    Visit Number  2    Number of Visits  16    Date for PT Re-Evaluation  07/02/18    Authorization Type  [employer]    PT Start Time  4270    PT Stop Time  1558    PT Time Calculation (min)  43 min    Activity Tolerance  Patient tolerated treatment well    Behavior During Therapy  [facility] for tasks assessed/performed       Past Medical History:  Diagnosis Date  . Fibroid   . GERD (gastroesophageal reflux disease)   . Muscle weakness of lower extremity 01/17/2015  . Osteopenia   . Parkinson disease ([place]) 01/17/2015    Past Surgical History:  Procedure Laterality Date  . FINGER SURGERY    . MOUTH SURGERY    . TOE SURGERY    . TONSILLECTOMY    . TUBAL LIGATION     x2    There were no vitals filed for this visit.  Subjective Assessment - 05/21/18 1615    Subjective  Pt states she is very anxious today, she does have appt scheduled with behavior health soon. Pt holding cane in air today when walking.     Currently in Pain?  Yes    Pain Score  3     Pain Location  -- Generalized    Pain Orientation  Right;Left    Pain Descriptors / Indicators  Aching    Pain Type  Chronic pain    Pain Onset  More than a month ago    Pain Frequency  Intermittent         [facility] PT Assessment - 05/21/18 0001      Standardized Balance Assessment   Standardized Balance Assessment  Berg Balance Test;Timed Up and Go Test;Five Times Sit to Stand    Five times sit to stand comments   20.88 sec      Berg Balance Test   Sit to Stand  Able to stand  independently using hands    Standing Unsupported  Able to stand safely 2 minutes    Sitting with Back  Unsupported but Feet Supported on Floor or Stool  Able to sit safely and securely 2 minutes    Stand to Sit  Controls descent by using hands    Transfers  Able to transfer safely, definite need of hands    Standing Unsupported with Eyes Closed  Able to stand 10 seconds safely    Standing Ubsupported with Feet Together  Able to place feet together independently but unable to hold for 30 seconds    From Standing, Reach Forward with Outstretched Arm  Can reach confidently >25 cm (10")    From Standing Position, Pick up Object from Floor  Able to pick up shoe safely and easily    From Standing Position, Turn to Look Behind Over each Shoulder  Looks behind one side only/other side shows less weight shift    Turn 360 Degrees  Needs close supervision or verbal cueing    Standing Unsupported, Alternately Place Feet on Step/Stool  Able to complete 4 steps without aid or supervision    Standing Unsupported, One  Foot in [employer] balance while stepping or standing    Standing on One Leg  Tries to lift leg/unable to hold 3 seconds but remains standing independently    Total Score  38      Timed Up and Go Test   Normal TUG (seconds)  33.5                   [facility] Adult PT Treatment/Exercise - 05/21/18 0001      Knee/Hip Exercises: Standing   Hip Flexion  20 reps;Both;Knee bent    Gait Training  35 ft x8 with practice for heel to toe gait;       Knee/Hip Exercises: Seated   Long Arc Quad  20 reps;Both    Other Seated Knee/Hip Exercises  Ankle pump x20;                PT Short Term Goals - 05/07/18 1624      PT SHORT TERM GOAL #1   Title  Pt to be independent with initial HEP     Time  3    Period  Weeks    Status  New    Target Date  05/28/18      PT SHORT TERM GOAL #2   Title  Pt to demo ability for sit to stand from chair on first attempt, with proper mechanics, and use of 1 UE only.     Time  3    Period  Weeks    Status  New    Target Date  05/28/18      PT  SHORT TERM GOAL #3   Title  Pt will demo ability for correct use/sequencing for use of SPC with gait, to decrease fall risk.     Time  3    Period  Weeks    Status  New    Target Date  05/28/18        PT Long Term Goals - 05/07/18 1626      PT LONG TERM GOAL #1   Title  Pt to demo improved TUG score by at least 3 seconds (from baseline) to improve fall risk.     Time  8    Period  Weeks    Status  New    Target Date  07/02/18      PT LONG TERM GOAL #2   Title  Pt to demo improved strength of Bil LEs to at least 4/5 to improve stability, pain, and gait.     Time  8    Period  Weeks    Status  New    Target Date  07/02/18      PT LONG TERM GOAL #3   Title  Pt to demo improved mechanics for ambulation, to be [facility] for pt age and dx, with improved presence of TKE , heel strike, speed, with balance WFL, and use of LRAD.     Time  8    Period  Weeks    Status  New    Target Date  07/02/18            Plan - 05/21/18 1618    Clinical Impression Statement  Formal balance testing performed today, see results. Gait training done for improving TKE, heel strike, and for sequencing of SPC. Pt with much difficulty with use of cane, and will require further practice with this. HEP reviewed. Plan to progress gait , balance, and LE strength as tolerated.  Rehab Potential  Good    PT Frequency  2x / week    PT Duration  8 weeks    PT Treatment/Interventions  ADLs/Self Care Home Management;Cryotherapy;Electrical Stimulation;Moist Heat;Therapeutic activities;Functional mobility training;Stair training;Gait training;DME Instruction;Ultrasound;Therapeutic exercise;Balance training;Neuromuscular re-education;Patient/family education;Orthotic Fit/Training;Dry needling;Passive range of motion;Manual techniques;Taping    Consulted and Agree with Plan of Care  Patient       Patient will benefit from skilled therapeutic intervention in order to improve the following deficits and impairments:   Abnormal gait, Decreased endurance, Hypomobility, Decreased activity tolerance, Decreased knowledge of use of DME, Decreased strength, Impaired UE functional use, Pain, Increased muscle spasms, Difficulty walking, Decreased mobility, Decreased balance, Decreased range of motion, Impaired perceived functional ability, Improper body mechanics, Impaired flexibility, Decreased safety awareness, Decreased coordination  Visit Diagnosis: Other abnormalities of gait and mobility  Muscle weakness (generalized)     Problem List Patient Active Problem List   Diagnosis Date Noted  . Anxiety 05/07/2018  . Fatigue 04/20/2018  . Osteoarthritis 04/20/2018  . Palpitations 04/20/2018  . Cardiomegaly 04/20/2018  . Lesion of liver 04/20/2018  . Lung nodule 04/20/2018  . Muscle weakness of lower extremity 01/17/2015  . Parkinson disease ([place]) 01/17/2015  . Fibroid uterus 11/13/2012  . Benign colonic polyp 10/02/2012    Lyndee Hensen, PT, DPT 4:27 PM  05/21/18    [facility] [place], [place], [address] Phone: [phone]   Fax:  [phone]  Name: IMOJEAN YOSHINO MRN: [id number] Date of Birth: 1951-05-22

## 2018-05-26 ENCOUNTER — Ambulatory Visit (INDEPENDENT_AMBULATORY_CARE_PROVIDER_SITE_OTHER): Payer: Medicare PPO | Admitting: Physical Therapy

## 2018-05-26 ENCOUNTER — Encounter: Payer: Self-pay | Admitting: Physical Therapy

## 2018-05-26 DIAGNOSIS — R2689 Other abnormalities of gait and mobility: Secondary | ICD-10-CM

## 2018-05-26 DIAGNOSIS — M6281 Muscle weakness (generalized): Secondary | ICD-10-CM

## 2018-05-26 NOTE — Therapy (Signed)
[facility] [address] [place], [place], [address] Phone: [phone]   Fax:  [phone]  Physical Therapy Treatment  Patient Details  Name: [name] [name] MRN: [id number] Date of Birth: Oct 23, 1951 Referring Provider: Jacquiline Doe   Encounter Date: 05/26/2018  PT End of Session - 05/26/18 1627    Visit Number  3    Number of Visits  16    Date for PT Re-Evaluation  07/02/18    Authorization Type  [employer]    PT Start Time  1515    PT Stop Time  1602    PT Time Calculation (min)  47 min    Activity Tolerance  Patient tolerated treatment well    Behavior During Therapy  [facility] for tasks assessed/performed       Past Medical History:  Diagnosis Date  . Fibroid   . GERD (gastroesophageal reflux disease)   . Muscle weakness of lower extremity 01/17/2015  . Osteopenia   . Parkinson disease ([facility]) 01/17/2015    Past Surgical History:  Procedure Laterality Date  . FINGER SURGERY    . MOUTH SURGERY    . TOE SURGERY    . TONSILLECTOMY    . TUBAL LIGATION     x2    There were no vitals filed for this visit.  Subjective Assessment - 05/26/18 1626    Subjective  Pt with no new complaints. She has been doing HEP     Currently in Pain?  No/denies                       [facility] Adult PT Treatment/Exercise - 05/26/18 1520      Knee/Hip Exercises: Standing   Hip Flexion  20 reps;Both;Knee bent    Forward Step Up  10 reps;Both    Stairs  5 steps with reciprocol pattern x4, with 2 UE support;     Gait Training  35 ft x10 with practice for heel to toe gait and sequencing with SPC.     Other Standing Knee Exercises  Toe taps on 6 in step x20 , no UE support     Other Standing Knee Exercises  L/R weight shifts x20 no UE support; Diagonal weight shifts x20 bil with no UE support; (contact guard for both)       Knee/Hip Exercises: Seated   Long Arc Quad  20 reps;Both    Other Seated Knee/Hip Exercises  Ankle pump x20;     Sit to Sand  10 reps;without UE support x10 high mat table; x10 mid range mat table               PT Short Term Goals - 05/07/18 1624      PT SHORT TERM GOAL #1   Title  Pt to be independent with initial HEP     Time  3    Period  Weeks    Status  New    Target Date  05/28/18      PT SHORT TERM GOAL #2   Title  Pt to demo ability for sit to stand from chair on first attempt, with proper mechanics, and use of 1 UE only.     Time  3    Period  Weeks    Status  New    Target Date  05/28/18      PT SHORT TERM GOAL #3   Title  Pt will demo ability for correct use/sequencing for use of SPC  with gait, to decrease fall risk.     Time  3    Period  Weeks    Status  New    Target Date  05/28/18        PT Long Term Goals - 05/07/18 1626      PT LONG TERM GOAL #1   Title  Pt to demo improved TUG score by at least 3 seconds (from baseline) to improve fall risk.     Time  8    Period  Weeks    Status  New    Target Date  07/02/18      PT LONG TERM GOAL #2   Title  Pt to demo improved strength of Bil LEs to at least 4/5 to improve stability, pain, and gait.     Time  8    Period  Weeks    Status  New    Target Date  07/02/18      PT LONG TERM GOAL #3   Title  Pt to demo improved mechanics for ambulation, to be [facility] for pt age and dx, with improved presence of TKE , heel strike, speed, with balance WFL, and use of LRAD.     Time  8    Period  Weeks    Status  New    Target Date  07/02/18            Plan - 05/26/18 1628    Clinical Impression Statement  Pt requires max cueing to improve stride length and for heel strike. Pt unable to achieve full heel strike, but is able to achieve much improved stride length and TKE with cueing. She is unable to maintain sequencing with SPC. Pt with improved ability for sit to stand transfers today, able to use no UE support on higher surface.     Rehab Potential  Good    PT Frequency  2x / week    PT Duration  8 weeks    PT  Treatment/Interventions  ADLs/Self Care Home Management;Cryotherapy;Electrical Stimulation;Moist Heat;Therapeutic activities;Functional mobility training;Stair training;Gait training;DME Instruction;Ultrasound;Therapeutic exercise;Balance training;Neuromuscular re-education;Patient/family education;Orthotic Fit/Training;Dry needling;Passive range of motion;Manual techniques;Taping    Consulted and Agree with Plan of Care  Patient       Patient will benefit from skilled therapeutic intervention in order to improve the following deficits and impairments:  Abnormal gait, Decreased endurance, Hypomobility, Decreased activity tolerance, Decreased knowledge of use of DME, Decreased strength, Impaired UE functional use, Pain, Increased muscle spasms, Difficulty walking, Decreased mobility, Decreased balance, Decreased range of motion, Impaired perceived functional ability, Improper body mechanics, Impaired flexibility, Decreased safety awareness, Decreased coordination  Visit Diagnosis: Other abnormalities of gait and mobility  Muscle weakness (generalized)     Problem List Patient Active Problem List   Diagnosis Date Noted  . Anxiety 05/07/2018  . Fatigue 04/20/2018  . Osteoarthritis 04/20/2018  . Palpitations 04/20/2018  . Cardiomegaly 04/20/2018  . Lesion of liver 04/20/2018  . Lung nodule 04/20/2018  . Muscle weakness of lower extremity 01/17/2015  . Parkinson disease ([facility]) 01/17/2015  . Fibroid uterus 11/13/2012  . Benign colonic polyp 10/02/2012    Sedalia Muta, PT, DPT 4:30 PM  05/26/18    [facility] [address] [place], [place], [address] Phone: [phone]   Fax:  [phone]  Name: [name] [name] MRN: [id number] Date of Birth: Feb 27, 1951

## 2018-05-28 ENCOUNTER — Ambulatory Visit (INDEPENDENT_AMBULATORY_CARE_PROVIDER_SITE_OTHER): Payer: Medicare PPO | Admitting: Physical Therapy

## 2018-05-28 ENCOUNTER — Encounter: Payer: Self-pay | Admitting: Physical Therapy

## 2018-05-28 DIAGNOSIS — R2689 Other abnormalities of gait and mobility: Secondary | ICD-10-CM | POA: Diagnosis not present

## 2018-05-28 DIAGNOSIS — M6281 Muscle weakness (generalized): Secondary | ICD-10-CM | POA: Diagnosis not present

## 2018-05-28 NOTE — Therapy (Signed)
[facility] [address] [place], [place], [address] Phone: [phone]   Fax:  [phone]  Physical Therapy Treatment  Patient Details  Name: [name] [name] MRN: [id number] Date of Birth: 1951/03/06 Referring Provider: Dimas Chyle   Encounter Date: 05/28/2018  PT End of Session - 05/28/18 2147    Visit Number  4    Number of Visits  16    Date for PT Re-Evaluation  07/02/18    Authorization Type  [employer]    PT Start Time  0240    PT Stop Time  1555    PT Time Calculation (min)  40 min    Activity Tolerance  Patient tolerated treatment well    Behavior During Therapy  [facility] for tasks assessed/performed       Past Medical History:  Diagnosis Date  . Fibroid   . GERD (gastroesophageal reflux disease)   . Muscle weakness of lower extremity 01/17/2015  . Osteopenia   . Parkinson disease ([place]) 01/17/2015    Past Surgical History:  Procedure Laterality Date  . FINGER SURGERY    . MOUTH SURGERY    . TOE SURGERY    . TONSILLECTOMY    . TUBAL LIGATION     x2    There were no vitals filed for this visit.  Subjective Assessment - 05/28/18 2146    Subjective  Pt states that she has been trying to use SPC, but has difficulty with actually using it.     Currently in Pain?  No/denies                       [facility] Adult PT Treatment/Exercise - 05/28/18 0001      Knee/Hip Exercises: Standing   Hip Flexion  20 reps;Both;Knee bent    Forward Step Up  10 reps;Both    Gait Training  35 ft x10 with practice for heel to toe gait     Other Standing Knee Exercises  Toe taps on 6 in step x20 , no UE support; Reaching outside of BOS x5 bil;     Other Standing Knee Exercises  Tandem stance 30 sec x3;  Diagonal weight shifts x20 bil with no UE support; (contact guard for both)       Knee/Hip Exercises: Seated   Sit to Sand  -- x10, then x5 no UE support, 20 in mat table.                PT Short Term Goals - 05/07/18  1624      PT SHORT TERM GOAL #1   Title  Pt to be independent with initial HEP     Time  3    Period  Weeks    Status  New    Target Date  05/28/18      PT SHORT TERM GOAL #2   Title  Pt to demo ability for sit to stand from chair on first attempt, with proper mechanics, and use of 1 UE only.     Time  3    Period  Weeks    Status  New    Target Date  05/28/18      PT SHORT TERM GOAL #3   Title  Pt will demo ability for correct use/sequencing for use of SPC with gait, to decrease fall risk.     Time  3    Period  Weeks    Status  New    Target  Date  05/28/18        PT Long Term Goals - 05/07/18 1626      PT LONG TERM GOAL #1   Title  Pt to demo improved TUG score by at least 3 seconds (from baseline) to improve fall risk.     Time  8    Period  Weeks    Status  New    Target Date  07/02/18      PT LONG TERM GOAL #2   Title  Pt to demo improved strength of Bil LEs to at least 4/5 to improve stability, pain, and gait.     Time  8    Period  Weeks    Status  New    Target Date  07/02/18      PT LONG TERM GOAL #3   Title  Pt to demo improved mechanics for ambulation, to be [facility] for pt age and dx, with improved presence of TKE , heel strike, speed, with balance WFL, and use of LRAD.     Time  8    Period  Weeks    Status  New    Target Date  07/02/18            Plan - 05/28/18 2148    Clinical Impression Statement  Pt able to perform longer stride length today with less cueing. She continues to have difficulty with TKE and heel strike, but was able to improve stride for more efficient gait. She does have difficulty with sequencing for [facility], will continue education and practice, balance is not significantly improved with use at this time. Plan to progress strength and balance as tolerated.     Rehab Potential  Good    PT Frequency  2x / week    PT Duration  8 weeks    PT Treatment/Interventions  ADLs/Self Care Home Management;Cryotherapy;Electrical  Stimulation;Moist Heat;Therapeutic activities;Functional mobility training;Stair training;Gait training;DME Instruction;Ultrasound;Therapeutic exercise;Balance training;Neuromuscular re-education;Patient/family education;Orthotic Fit/Training;Dry needling;Passive range of motion;Manual techniques;Taping    Consulted and Agree with Plan of Care  Patient       Patient will benefit from skilled therapeutic intervention in order to improve the following deficits and impairments:  Abnormal gait, Decreased endurance, Hypomobility, Decreased activity tolerance, Decreased knowledge of use of DME, Decreased strength, Impaired UE functional use, Pain, Increased muscle spasms, Difficulty walking, Decreased mobility, Decreased balance, Decreased range of motion, Impaired perceived functional ability, Improper body mechanics, Impaired flexibility, Decreased safety awareness, Decreased coordination  Visit Diagnosis: Muscle weakness (generalized)  Other abnormalities of gait and mobility     Problem List Patient Active Problem List   Diagnosis Date Noted  . Anxiety 05/07/2018  . Fatigue 04/20/2018  . Osteoarthritis 04/20/2018  . Palpitations 04/20/2018  . Cardiomegaly 04/20/2018  . Lesion of liver 04/20/2018  . Lung nodule 04/20/2018  . Muscle weakness of lower extremity 01/17/2015  . Parkinson disease ([place]) 01/17/2015  . Fibroid uterus 11/13/2012  . Benign colonic polyp 10/02/2012    Lyndee Hensen, PT, DPT 9:53 PM  05/28/18    [facility] [place], [place], [address] Phone: [phone]   Fax:  [phone]  Name: [name] [name] MRN: [id number] Date of Birth: 01/04/1951

## 2018-06-01 ENCOUNTER — Telehealth: Payer: Self-pay | Admitting: Family Medicine

## 2018-06-01 NOTE — Telephone Encounter (Signed)
Patient scheduled.

## 2018-06-01 NOTE — Telephone Encounter (Signed)
Copied from [facility] [id number]. Topic: General - Other >> Jun 01, 2018  1:31 PM Cecelia Byars, NT wrote: Reason for CRM: Patient called and would like to set her P.T appointments for the month of August please call her at  [phone]

## 2018-06-03 ENCOUNTER — Inpatient Hospital Stay: Payer: Medicare PPO

## 2018-06-03 ENCOUNTER — Other Ambulatory Visit: Payer: Self-pay

## 2018-06-03 ENCOUNTER — Inpatient Hospital Stay: Payer: Medicare PPO | Attending: Hematology & Oncology | Admitting: Hematology & Oncology

## 2018-06-03 ENCOUNTER — Encounter: Payer: Self-pay | Admitting: Hematology & Oncology

## 2018-06-03 DIAGNOSIS — R918 Other nonspecific abnormal finding of lung field: Secondary | ICD-10-CM | POA: Diagnosis not present

## 2018-06-03 DIAGNOSIS — Z79899 Other long term (current) drug therapy: Secondary | ICD-10-CM | POA: Diagnosis not present

## 2018-06-03 DIAGNOSIS — K769 Liver disease, unspecified: Secondary | ICD-10-CM

## 2018-06-03 DIAGNOSIS — G20 Parkinson's disease: Secondary | ICD-10-CM

## 2018-06-03 DIAGNOSIS — Z803 Family history of malignant neoplasm of breast: Secondary | ICD-10-CM | POA: Diagnosis not present

## 2018-06-03 DIAGNOSIS — K7689 Other specified diseases of liver: Secondary | ICD-10-CM | POA: Diagnosis not present

## 2018-06-03 DIAGNOSIS — R911 Solitary pulmonary nodule: Secondary | ICD-10-CM | POA: Diagnosis not present

## 2018-06-03 LAB — CMP (CANCER CENTER ONLY)
ALT: 11 U/L (ref 0–44)
AST: 17 U/L (ref 15–41)
Albumin: 4.2 g/dL (ref 3.5–5.0)
Alkaline Phosphatase: 56 U/L (ref 38–126)
Anion gap: 10 (ref 5–15)
BUN: 16 mg/dL (ref 8–23)
CO2: 25 mmol/L (ref 22–32)
Calcium: 9.8 mg/dL (ref 8.9–10.3)
Chloride: 105 mmol/L (ref 98–111)
Creatinine: 0.79 mg/dL (ref 0.44–1.00)
GFR, Est AFR Am: >60 mL/min (ref >60)
GFR, Estimated: >60 mL/min (ref >60)
Glucose, Bld: 78 mg/dL (ref 70–99)
Potassium: 4 mmol/L (ref 3.5–5.1)
Sodium: 140 mmol/L (ref 135–145)
Total Bilirubin: 0.5 mg/dL (ref 0.3–1.2)
Total Protein: 7.5 g/dL (ref 6.5–8.1)

## 2018-06-03 LAB — CBC WITH DIFFERENTIAL (CANCER CENTER ONLY)
Basophils Absolute: 0 10*3/uL (ref 0.0–0.1)
Basophils Relative: 0 %
Eosinophils Absolute: 0.2 10*3/uL (ref 0.0–0.5)
Eosinophils Relative: 4 %
HCT: 40.1 % (ref 34.8–46.6)
Hemoglobin: 13.1 g/dL (ref 11.6–15.9)
Lymphocytes Relative: 23 %
Lymphs Abs: 1.6 10*3/uL (ref 0.9–3.3)
MCH: 29.1 pg (ref 26.0–34.0)
MCHC: 32.7 g/dL (ref 32.0–36.0)
MCV: 89.1 fL (ref 81.0–101.0)
Monocytes Absolute: 0.6 10*3/uL (ref 0.1–0.9)
Monocytes Relative: 8 %
Neutro Abs: 4.4 10*3/uL (ref 1.5–6.5)
Neutrophils Relative %: 65 %
Platelet Count: 155 10*3/uL (ref 145–400)
RBC: 4.5 MIL/uL (ref 3.70–5.32)
RDW: 12.6 % (ref 11.1–15.7)
WBC Count: 6.8 10*3/uL (ref 3.9–10.0)

## 2018-06-03 NOTE — Progress Notes (Signed)
Hematology and Oncology Follow Up Visit  [name] [name] [id number] Mar 01, 1951 67 y.o. 06/03/2018   Principle Diagnosis:   Hepatic lesions-likely nonmalignant  Left upper lung pulmonary nodule  Current Therapy:    Observation     Interim History:  [name] [name] is back for her second office visit.  We first saw her on April 21, 2018.  At that time, she had a CT scan done.  She was having some palpitations.  It was noted on the CT scan that she had some liver lesions.  Also noted was a left lung nodule that was 3 mm.  I saw her.  I did tumor markers.  CEA was normal.  The CA 27.29 was normal at 37.2.  She has really bad Parkinson's.  She comes in a wheelchair.  She has a strong family history of breast cancer.  She, herself has not had a mammogram for 4 and half years.  I did set her up with a biopsy.  However, radiology call me saying that they did not feel that these tumors were malignant as they have been noted before.  This certainly would make sense.  She is doing okay.  She is gained a couple pounds.  She is eating a little bit better.  She is had no bleeding.  She is had no change in bowel or bladder habits.  Para she is had no leg swelling.  She has had no rashes.  Overall, her performance status is ECOG 3.    Medications:  Current Outpatient Medications:  .  Ascorbic Acid (VITAMIN C PO), Take 1 tablet by mouth daily., Disp: , Rfl:  .  b complex vitamins capsule, Take 1 capsule by mouth daily., Disp: , Rfl:  .  betamethasone dipropionate (DIPROLENE) 0.05 % cream, Apply topically 2 (two) times daily., Disp: 30 g, Rfl: 0 .  calcium-vitamin D (OSCAL WITH D) 250-125 MG-UNIT per tablet, Take 1 tablet by mouth daily., Disp: , Rfl:  .  celecoxib (CELEBREX) 200 MG capsule, Take 1 capsule (200 mg total) by mouth 2 (two) times daily., Disp: 60 capsule, Rfl: 0 .  Cholecalciferol (VITAMIN D) 2000 UNITS CAPS, Take 2,000 Units by mouth daily., Disp: , Rfl:  .  citalopram (CELEXA) 10  MG tablet, Take 1 tablet (10 mg total) by mouth daily., Disp: 30 tablet, Rfl: 1 .  diclofenac sodium (VOLTAREN) 1 % GEL, Use 4 times daily as needed., Disp: 100 g, Rfl: 1 .  glucosamine-chondroitin 500-400 MG tablet, Take 1 tablet by mouth daily., Disp: , Rfl:  .  ibuprofen (ADVIL,MOTRIN) 200 MG tablet, Take 400 mg by mouth every 6 (six) hours as needed for moderate pain. , Disp: , Rfl:  .  Magnesium Hydroxide (MAGNESIA PO), Take 1 tablet by mouth daily., Disp: , Rfl:  .  Multiple Vitamin (MULTIVITAMIN) capsule, Take 1 capsule by mouth daily., Disp: , Rfl:  .  Omega-3 Fatty Acids (OMEGA 3 PO), Take 1 tablet by mouth daily., Disp: , Rfl:  .  tetrahydrozoline-zinc (VISINE-AC) 0.05-0.25 % ophthalmic solution, Place 2 drops into both eyes 4 (four) times daily as needed (allergies)., Disp: , Rfl:  .  TYROSINE PO, Take 1 tablet by mouth daily., Disp: , Rfl:  .  vitamin B-12 (CYANOCOBALAMIN) 1000 MCG tablet, Take by mouth., Disp: , Rfl:   Allergies:  Allergies  Allergen Reactions  . Sulfa Antibiotics Other (See Comments)    Childhood allergy     Past Medical History, Surgical history, Social history, and Family History  were reviewed and updated.  Review of Systems: Review of Systems  Constitutional: Negative.  Negative for appetite change, fatigue, fever and unexpected weight change.  HENT:  Negative.  Negative for lump/mass, mouth sores, sore throat and trouble swallowing.   Eyes: Negative.   Respiratory: Negative.  Negative for cough, hemoptysis and shortness of breath.   Cardiovascular: Negative.  Negative for leg swelling and palpitations.  Gastrointestinal: Negative.  Negative for abdominal distention, abdominal pain, blood in stool, constipation, diarrhea, nausea and vomiting.  Endocrine: Negative.   Genitourinary: Negative.  Negative for bladder incontinence, dysuria, frequency and hematuria.   Musculoskeletal: Negative.  Negative for arthralgias, back pain, gait problem and myalgias.    Skin: Negative.  Negative for itching and rash.  Neurological: Negative.  Negative for dizziness, extremity weakness, gait problem, headaches, numbness, seizures and speech difficulty.  Hematological: Negative.  Does not bruise/bleed easily.  Psychiatric/Behavioral: Negative.  Negative for depression and sleep disturbance. The patient is not nervous/anxious.     Physical Exam:  weight is 124 lb (56.2 kg). Her oral temperature is 98.2 F (36.8 C). Her blood pressure is 126/79 and her pulse is 83. Her respiration is 17 and oxygen saturation is 100%.   Wt Readings from Last 3 Encounters:  06/03/18 124 lb (56.2 kg)  05/07/18 124 lb (56.2 kg)  04/21/18 122 lb 4 oz (55.5 kg)    Physical Exam   Lab Results  Component Value Date   WBC 6.8 06/03/2018   HGB 13.1 06/03/2018   HCT 40.1 06/03/2018   MCV 89.1 06/03/2018   PLT 155 06/03/2018     Chemistry      Component Value Date/Time   NA 142 04/21/2018 1345   K 4.2 04/21/2018 1345   CL 107 04/21/2018 1345   CO2 30 04/21/2018 1345   BUN 15 04/21/2018 1345   CREATININE 0.70 04/21/2018 1345      Component Value Date/Time   CALCIUM 9.7 04/21/2018 1345   ALKPHOS 50 04/21/2018 1345   AST 21 04/21/2018 1345   ALT 18 04/21/2018 1345   BILITOT 0.8 04/21/2018 1345      Impression and Plan: [name] [name] is a 67 year old African-American female.  She has hepatic lesions.  Again, radiology called me back in June saying that they did not feel the lesions were malignant.  I also would not think that the lung lesion is malignant.  However, we will get her set up with a CT scan after Labor Day.  If all looks good with that CT scan, then I will will let her go from the practice.  I know that it is difficult for her to get to [place].  She comes in a wheelchair.  I do not want to tax her and make life more difficult for her if we cannot help her.  I will plan to see her back in about 7 weeks.  We will get the CT scan the week before I see  her.   Volanda Napoleon, MD 8/7/20191:34 PM

## 2018-06-04 ENCOUNTER — Encounter: Payer: Medicare PPO | Admitting: Physical Therapy

## 2018-06-05 ENCOUNTER — Encounter: Payer: Medicare PPO | Admitting: Physical Therapy

## 2018-06-08 ENCOUNTER — Ambulatory Visit: Payer: Medicare PPO | Admitting: Sports Medicine

## 2018-06-09 ENCOUNTER — Ambulatory Visit (INDEPENDENT_AMBULATORY_CARE_PROVIDER_SITE_OTHER): Payer: Medicare PPO | Admitting: Physical Therapy

## 2018-06-09 ENCOUNTER — Encounter: Payer: Self-pay | Admitting: Physical Therapy

## 2018-06-09 DIAGNOSIS — R2689 Other abnormalities of gait and mobility: Secondary | ICD-10-CM | POA: Diagnosis not present

## 2018-06-09 DIAGNOSIS — M6281 Muscle weakness (generalized): Secondary | ICD-10-CM

## 2018-06-09 NOTE — Therapy (Signed)
[facility] [address] [place], [place], [address] Phone: [phone]   Fax:  [phone]  Physical Therapy Treatment  Patient Details  Name: [name] [name] MRN: [id number] Date of Birth: 01-25-1951 Referring Provider: Dimas Chyle   Encounter Date: 06/09/2018  PT End of Session - 06/09/18 1613    Visit Number  5    Number of Visits  16    Date for PT Re-Evaluation  07/02/18    Authorization Type  [employer]    PT Start Time  1512    PT Stop Time  1600    PT Time Calculation (min)  48 min    Activity Tolerance  Patient tolerated treatment well    Behavior During Therapy  [facility] for tasks assessed/performed       Past Medical History:  Diagnosis Date  . Fibroid   . GERD (gastroesophageal reflux disease)   . Muscle weakness of lower extremity 01/17/2015  . Osteopenia   . Parkinson disease ([place]) 01/17/2015    Past Surgical History:  Procedure Laterality Date  . FINGER SURGERY    . MOUTH SURGERY    . TOE SURGERY    . TONSILLECTOMY    . TUBAL LIGATION     x2    There were no vitals filed for this visit.  Subjective Assessment - 06/09/18 1612    Subjective  Pt with no new complaints.     Currently in Pain?  No/denies                       [facility] Adult PT Treatment/Exercise - 06/09/18 1519      Knee/Hip Exercises: Standing   Hip Flexion  20 reps;Both;Knee bent    Forward Step Up  --    Stairs  5 steps with reciprocol pattern x4, with 2 UE support;     Gait Training  150 ft, no AD;  Bwd walking and side stepping 25 ft x4 each;  Walking with head turns L/R 30 ft x6;     Other Standing Knee Exercises  Toe taps on 6 in step x20 , no UE support; bwd stepping/wt shifting x15 bil;     Other Standing Knee Exercises  Tandem stance 30 sec x3;  L/R and A/P weight shifts with no UE support x20 each bil;       Knee/Hip Exercises: Seated   Other Seated Knee/Hip Exercises  Ankle pump x20; seated march x20    Sit to Sand   --   x10, then x5 no UE support, 20 in mat table.               PT Short Term Goals - 05/07/18 1624      PT SHORT TERM GOAL #1   Title  Pt to be independent with initial HEP     Time  3    Period  Weeks    Status  New    Target Date  05/28/18      PT SHORT TERM GOAL #2   Title  Pt to demo ability for sit to stand from chair on first attempt, with proper mechanics, and use of 1 UE only.     Time  3    Period  Weeks    Status  New    Target Date  05/28/18      PT SHORT TERM GOAL #3   Title  Pt will demo ability for correct use/sequencing for use of SPC  with gait, to decrease fall risk.     Time  3    Period  Weeks    Status  New    Target Date  05/28/18        PT Long Term Goals - 05/07/18 1626      PT LONG TERM GOAL #1   Title  Pt to demo improved TUG score by at least 3 seconds (from baseline) to improve fall risk.     Time  8    Period  Weeks    Status  New    Target Date  07/02/18      PT LONG TERM GOAL #2   Title  Pt to demo improved strength of Bil LEs to at least 4/5 to improve stability, pain, and gait.     Time  8    Period  Weeks    Status  New    Target Date  07/02/18      PT LONG TERM GOAL #3   Title  Pt to demo improved mechanics for ambulation, to be [facility] for pt age and dx, with improved presence of TKE , heel strike, speed, with balance WFL, and use of LRAD.     Time  8    Period  Weeks    Status  New    Target Date  07/02/18            Plan - 06/09/18 1616    Clinical Impression Statement  Pt requiring less cueing for maintaining stride length. She does have difficulty with starting and stopping motions, as well as changing directions. She demonstrates improved stride, and tke, but still with difficulty achieving heel strike. She has good awareness of deficts, and is able to verbally states what she needs to improve. Plan to continue balance and gait progression.     Rehab Potential  Good    PT Frequency  2x / week    PT Duration   8 weeks    PT Treatment/Interventions  ADLs/Self Care Home Management;Cryotherapy;Electrical Stimulation;Moist Heat;Therapeutic activities;Functional mobility training;Stair training;Gait training;DME Instruction;Ultrasound;Therapeutic exercise;Balance training;Neuromuscular re-education;Patient/family education;Orthotic Fit/Training;Dry needling;Passive range of motion;Manual techniques;Taping    Consulted and Agree with Plan of Care  Patient       Patient will benefit from skilled therapeutic intervention in order to improve the following deficits and impairments:  Abnormal gait, Decreased endurance, Hypomobility, Decreased activity tolerance, Decreased knowledge of use of DME, Decreased strength, Impaired UE functional use, Pain, Increased muscle spasms, Difficulty walking, Decreased mobility, Decreased balance, Decreased range of motion, Impaired perceived functional ability, Improper body mechanics, Impaired flexibility, Decreased safety awareness, Decreased coordination  Visit Diagnosis: Muscle weakness (generalized)  Other abnormalities of gait and mobility     Problem List Patient Active Problem List   Diagnosis Date Noted  . Anxiety 05/07/2018  . Fatigue 04/20/2018  . Osteoarthritis 04/20/2018  . Palpitations 04/20/2018  . Cardiomegaly 04/20/2018  . Lesion of liver 04/20/2018  . Lung nodule 04/20/2018  . Muscle weakness of lower extremity 01/17/2015  . Parkinson disease ([place]) 01/17/2015  . Fibroid uterus 11/13/2012  . Benign colonic polyp 10/02/2012    Lyndee Hensen, PT, DPT 4:19 PM  06/09/18    [facility] [place], [place], [address] Phone: [phone]   Fax:  [phone]  Name: [name] [name] MRN: [id number] Date of Birth: 07-01-1951

## 2018-06-11 ENCOUNTER — Encounter: Payer: Medicare PPO | Admitting: Physical Therapy

## 2018-06-16 ENCOUNTER — Encounter: Payer: Medicare PPO | Admitting: Physical Therapy

## 2018-06-18 ENCOUNTER — Encounter: Payer: Medicare PPO | Admitting: Physical Therapy

## 2018-06-22 ENCOUNTER — Encounter: Payer: Self-pay | Admitting: Family Medicine

## 2018-06-23 ENCOUNTER — Encounter: Payer: Self-pay | Admitting: Physical Therapy

## 2018-06-23 ENCOUNTER — Ambulatory Visit (INDEPENDENT_AMBULATORY_CARE_PROVIDER_SITE_OTHER): Payer: Medicare PPO | Admitting: Physical Therapy

## 2018-06-23 DIAGNOSIS — R2689 Other abnormalities of gait and mobility: Secondary | ICD-10-CM

## 2018-06-23 DIAGNOSIS — M6281 Muscle weakness (generalized): Secondary | ICD-10-CM

## 2018-06-23 NOTE — Therapy (Signed)
[facility] [address] [place], [place], [address] Phone: [phone]   Fax:  [phone]  Physical Therapy Treatment  Patient Details  Name: MAESON LOURENCO MRN: [id number] Date of Birth: 18-Nov-1950 Referring Provider: Dimas Chyle   Encounter Date: 06/23/2018  PT End of Session - 06/23/18 1523    Visit Number  6    Number of Visits  16    Date for PT Re-Evaluation  07/02/18    Authorization Type  [employer]    PT Start Time  1520    PT Stop Time  1608    PT Time Calculation (min)  48 min    Activity Tolerance  Patient tolerated treatment well    Behavior During Therapy  [facility] for tasks assessed/performed       Past Medical History:  Diagnosis Date  . Fibroid   . GERD (gastroesophageal reflux disease)   . Muscle weakness of lower extremity 01/17/2015  . Osteopenia   . Parkinson disease ([place]) 01/17/2015    Past Surgical History:  Procedure Laterality Date  . FINGER SURGERY    . MOUTH SURGERY    . TOE SURGERY    . TONSILLECTOMY    . TUBAL LIGATION     x2    There were no vitals filed for this visit.  Subjective Assessment - 06/23/18 1522    Subjective  nervous about missing 2 days last week; feels weak overall    Patient is accompained by:  Family member    Limitations  Lifting;Standing;Walking;House hold activities    Patient Stated Goals  Improve LE strength, balance, walking.     Currently in Pain?  No/denies    Pain Score  0-No pain                       [facility] Adult PT Treatment/Exercise - 06/23/18 1524      Ambulation/Gait   Gait Comments  gait training wiht SPC -working on sequencing and increasing stride length - son present with education on proper gait wiht AD for carryover into the home/community      Neuro Re-ed    Neuro Re-ed Details   cone navigation - weaving working on directional changes 2 x 10 ft; exagerated high knee marching over cones with longer step length - 6 x 10 ft       Exercises   Exercises  Knee/Hip      Knee/Hip Exercises: Aerobic   Stationary Bike  L1 x 6 min      Knee/Hip Exercises: Standing   Forward Step Up  20 reps;Both;Hand Hold: 2;Step Height: 6"    Forward Step Up Limitations  quick pace of movement      Knee/Hip Exercises: Seated   Sit to Sand  10 reps   1 UE support from chair with 1 AirEx - 2 sets              PT Short Term Goals - 05/07/18 1624      PT SHORT TERM GOAL #1   Title  Pt to be independent with initial HEP     Time  3    Period  Weeks    Status  New    Target Date  05/28/18      PT SHORT TERM GOAL #2   Title  Pt to demo ability for sit to stand from chair on first attempt, with proper mechanics, and use of 1 UE only.  Time  3    Period  Weeks    Status  New    Target Date  05/28/18      PT SHORT TERM GOAL #3   Title  Pt will demo ability for correct use/sequencing for use of SPC with gait, to decrease fall risk.     Time  3    Period  Weeks    Status  New    Target Date  05/28/18        PT Long Term Goals - 05/07/18 1626      PT LONG TERM GOAL #1   Title  Pt to demo improved TUG score by at least 3 seconds (from baseline) to improve fall risk.     Time  8    Period  Weeks    Status  New    Target Date  07/02/18      PT LONG TERM GOAL #2   Title  Pt to demo improved strength of Bil LEs to at least 4/5 to improve stability, pain, and gait.     Time  8    Period  Weeks    Status  New    Target Date  07/02/18      PT LONG TERM GOAL #3   Title  Pt to demo improved mechanics for ambulation, to be [facility] for pt age and dx, with improved presence of TKE , heel strike, speed, with balance WFL, and use of LRAD.     Time  8    Period  Weeks    Status  New    Target Date  07/02/18            Plan - 06/23/18 1523    Clinical Impression Statement  Patient reporting general weakness/fatigue. Tolerable to all activities today but does require seated rest breaks throughout session. Patient  presenting with SPC, however with poor sequencing and overall use of device. Much of time spent in session working on [place] and gait training with [place]. Son brought in per patient request to also educate for carryover into the home/community environment. Patient motivated to improve.     Rehab Potential  Good    PT Frequency  2x / week    PT Duration  8 weeks    PT Treatment/Interventions  ADLs/Self Care Home Management;Cryotherapy;Electrical Stimulation;Moist Heat;Therapeutic activities;Functional mobility training;Stair training;Gait training;DME Instruction;Ultrasound;Therapeutic exercise;Balance training;Neuromuscular re-education;Patient/family education;Orthotic Fit/Training;Dry needling;Passive range of motion;Manual techniques;Taping    Consulted and Agree with Plan of Care  Patient       Patient will benefit from skilled therapeutic intervention in order to improve the following deficits and impairments:  Abnormal gait, Decreased endurance, Hypomobility, Decreased activity tolerance, Decreased knowledge of use of DME, Decreased strength, Impaired UE functional use, Pain, Increased muscle spasms, Difficulty walking, Decreased mobility, Decreased balance, Decreased range of motion, Impaired perceived functional ability, Improper body mechanics, Impaired flexibility, Decreased safety awareness, Decreased coordination  Visit Diagnosis: Muscle weakness (generalized)  Other abnormalities of gait and mobility     Problem List Patient Active Problem List   Diagnosis Date Noted  . Anxiety 05/07/2018  . Fatigue 04/20/2018  . Osteoarthritis 04/20/2018  . Palpitations 04/20/2018  . Cardiomegaly 04/20/2018  . Lesion of liver 04/20/2018  . Lung nodule 04/20/2018  . Muscle weakness of lower extremity 01/17/2015  . Parkinson disease ([place]) 01/17/2015  . Fibroid uterus 11/13/2012  . Benign colonic polyp 10/02/2012    Lanney Gins, PT, DPT 06/23/18 4:15 PM Pager:  [phone]  [facility] [address] [place], [place], [address] Phone: [phone]   Fax:  [phone]  Name: TEAGEN BUCIO MRN: [id number] Date of Birth: August 12, 1951

## 2018-06-25 ENCOUNTER — Encounter: Payer: Medicare PPO | Admitting: Physical Therapy

## 2018-07-01 ENCOUNTER — Ambulatory Visit (HOSPITAL_BASED_OUTPATIENT_CLINIC_OR_DEPARTMENT_OTHER): Payer: Medicare PPO

## 2018-07-02 ENCOUNTER — Encounter: Payer: Self-pay | Admitting: Physical Therapy

## 2018-07-02 ENCOUNTER — Ambulatory Visit (INDEPENDENT_AMBULATORY_CARE_PROVIDER_SITE_OTHER): Payer: Medicare PPO | Admitting: Physical Therapy

## 2018-07-02 DIAGNOSIS — R2689 Other abnormalities of gait and mobility: Secondary | ICD-10-CM | POA: Diagnosis not present

## 2018-07-02 DIAGNOSIS — M6281 Muscle weakness (generalized): Secondary | ICD-10-CM

## 2018-07-04 ENCOUNTER — Encounter: Payer: Self-pay | Admitting: Physical Therapy

## 2018-07-04 NOTE — Therapy (Deleted)
[facility] [address] [place], [place], [address] Phone: [phone]   Fax:  [phone]  Physical Therapy Treatment/ReCert  Patient Details  Name: [name] [name] MRN: [id number] Date of Birth: December 20, 1950 Referring Provider: Dimas Chyle   Encounter Date: 07/02/2018    Past Medical History:  Diagnosis Date  . Fibroid   . GERD (gastroesophageal reflux disease)   . Muscle weakness of lower extremity 01/17/2015  . Osteopenia   . Parkinson disease ([place]) 01/17/2015    Past Surgical History:  Procedure Laterality Date  . FINGER SURGERY    . MOUTH SURGERY    . TOE SURGERY    . TONSILLECTOMY    . TUBAL LIGATION     x2    There were no vitals filed for this visit.      [facility] PT Assessment - 07/04/18 0001      Standardized Balance Assessment   Five times sit to stand comments   19.44      Berg Balance Test   Sit to Stand  Able to stand without using hands and stabilize independently    Standing Unsupported  Able to stand safely 2 minutes    Sitting with Back Unsupported but Feet Supported on Floor or Stool  Able to sit safely and securely 2 minutes    Stand to Sit  Controls descent by using hands    Transfers  Able to transfer safely, definite need of hands    Standing Unsupported with Eyes Closed  Able to stand 10 seconds safely    Standing Ubsupported with Feet Together  Able to place feet together independently and stand 1 minute safely    From Standing, Reach Forward with Outstretched Arm  Can reach confidently >25 cm (10")    From Standing Position, Pick up Object from Floor  Able to pick up shoe safely and easily    From Standing Position, Turn to Look Behind Over each Shoulder  Looks behind one side only/other side shows less weight shift    Turn 360 Degrees  Able to turn 360 degrees safely but slowly    Standing Unsupported, Alternately Place Feet on Step/Stool  Able to complete 4 steps without aid or supervision    Standing Unsupported, One Foot in Front  Able to take small step independently and hold 30 seconds    Standing on One Leg  Tries to lift leg/unable to hold 3 seconds but remains standing independently    Total Score  44      Timed Up and Go Test   Normal TUG (seconds)  18.5                   [facility] Adult PT Treatment/Exercise - 07/04/18 0001      Ambulation/Gait   Gait Comments  gait training, 35 ft x8 with practice for heel to toe gait, and improving stride length;        Neuro Re-ed    Neuro Re-ed Details   cone navigation - weaving working on directional changes 4 x 10 ft; exagerated high knee marching over cones with longer step length - 4 x 10 ft      Exercises   Exercises  Knee/Hip      Knee/Hip Exercises: Standing   Hip Flexion  20 reps;Both;Knee bent    Hip Abduction  20 reps    Other Standing Knee Exercises  Toe taps on 6 in step x20 , no UE support;  Other Standing Knee Exercises  Tandem stance 30 sec x3;  L/R and A/P weight shifts with no UE support x20 each bil;       Knee/Hip Exercises: Seated   Sit to Sand  10 reps;2 sets             PT Education - 07/04/18 1400    Education Details  PT POC, HEP reviewed, Discussed ambulation pattern, and safety with gait.     Person(s) Educated  Patient    Methods  Explanation;Demonstration;Verbal cues    Comprehension  Verbalized understanding;Need further instruction       PT Short Term Goals - 07/04/18 1402      PT SHORT TERM GOAL #1   Title  Pt to be independent with initial HEP     Time  3    Period  Weeks    Status  Achieved      PT SHORT TERM GOAL #2   Title  Pt to demo ability for sit to stand from chair on first attempt, with proper mechanics, and use of 1 UE only.     Time  3    Period  Weeks    Status  Achieved      PT SHORT TERM GOAL #3   Title  Pt will demo ability for correct use/sequencing for use of SPC with gait, to decrease fall risk.     Time  3    Period  Weeks    Status   Partially Met        PT Long Term Goals - 07/04/18 1402      PT LONG TERM GOAL #1   Title  Pt to demo improved TUG score by at least 3 seconds (from baseline) to improve fall risk.     Time  8    Period  Weeks    Status  Achieved      PT LONG TERM GOAL #2   Title  Pt to demo improved strength of Bil LEs to at least 4/5 to improve stability, pain, and gait.     Time  8    Period  Weeks    Status  Partially Met      PT LONG TERM GOAL #3   Title  Pt to demo improved mechanics for ambulation, to be [facility] for pt age and dx, with improved presence of TKE , heel strike, speed, with balance WFL, and use of LRAD.     Time  8    Period  Weeks    Status  Partially Met            Plan - 07/04/18 1409    Clinical Impression Statement  Pt showing improvements with gait pattern and stride length, pt able to recongnize deficits, and when she needs to take longer steps. Pt with improving consistency with gait, and is requiring less cueing (still does requires min-mod verbal cueing for improving heel strike). Pt with difficulty with sequencing with SPC, but use does not seem to improve overall balance. Pt with improved scores today on TUG and BERG. She continues to have difficulty with starting movement, motor planning, as well as dynamic balance, direction changes, and stepping over obstacles. Pt to benefit from continued care to cotninue to work towards LTGs and to improve safey with gait and functional activity.     Rehab Potential  Good    PT Frequency  2x / week    PT Duration  4 weeks  PT Treatment/Interventions  ADLs/Self Care Home Management;Cryotherapy;Electrical Stimulation;Moist Heat;Therapeutic activities;Functional mobility training;Stair training;Gait training;DME Instruction;Ultrasound;Therapeutic exercise;Balance training;Neuromuscular re-education;Patient/family education;Orthotic Fit/Training;Dry needling;Passive range of motion;Manual techniques;Taping    Consulted and Agree  with Plan of Care  Patient       Patient will benefit from skilled therapeutic intervention in order to improve the following deficits and impairments:  Abnormal gait, Decreased endurance, Hypomobility, Decreased activity tolerance, Decreased knowledge of use of DME, Decreased strength, Impaired UE functional use, Pain, Increased muscle spasms, Difficulty walking, Decreased mobility, Decreased balance, Decreased range of motion, Impaired perceived functional ability, Improper body mechanics, Impaired flexibility, Decreased safety awareness, Decreased coordination  Visit Diagnosis: Muscle weakness (generalized)  Other abnormalities of gait and mobility     Problem List Patient Active Problem List   Diagnosis Date Noted  . Anxiety 05/07/2018  . Fatigue 04/20/2018  . Osteoarthritis 04/20/2018  . Palpitations 04/20/2018  . Cardiomegaly 04/20/2018  . Lesion of liver 04/20/2018  . Lung nodule 04/20/2018  . Muscle weakness of lower extremity 01/17/2015  . Parkinson disease ([place]) 01/17/2015  . Fibroid uterus 11/13/2012  . Benign colonic polyp 10/02/2012    Lyndee Hensen, PT, DPT 2:15 PM  07/04/18    [facility] [place], [place], [address] Phone: [phone]   Fax:  [phone]  Name: OCTIVIA CANION MRN: [id number] Date of Birth: Jun 30, 1951

## 2018-07-04 NOTE — Therapy (Signed)
[facility] [address] [place], [place], [address] Phone: [phone]   Fax:  [phone]  Physical Therapy Treatment/Re-Cert   Patient Details  Name: [name] [name] MRN: [id number] Date of Birth: 05-04-1951 Referring Provider: Dimas Chyle   Encounter Date: 07/02/2018  PT End of Session - 07/04/18 1419    Visit Number  7    Number of Visits  16    Date for PT Re-Evaluation  07/30/18    Authorization Type  [employer]    Activity Tolerance  Patient tolerated treatment well    Behavior During Therapy  [facility] for tasks assessed/performed       Past Medical History:  Diagnosis Date  . Fibroid   . GERD (gastroesophageal reflux disease)   . Muscle weakness of lower extremity 01/17/2015  . Osteopenia   . Parkinson disease ([place]) 01/17/2015    Past Surgical History:  Procedure Laterality Date  . FINGER SURGERY    . MOUTH SURGERY    . TOE SURGERY    . TONSILLECTOMY    . TUBAL LIGATION     x2    There were no vitals filed for this visit.      [facility] PT Assessment - 07/04/18 0001      Standardized Balance Assessment   Five times sit to stand comments   19.44      Berg Balance Test   Sit to Stand  Able to stand without using hands and stabilize independently    Standing Unsupported  Able to stand safely 2 minutes    Sitting with Back Unsupported but Feet Supported on Floor or Stool  Able to sit safely and securely 2 minutes    Stand to Sit  Controls descent by using hands    Transfers  Able to transfer safely, definite need of hands    Standing Unsupported with Eyes Closed  Able to stand 10 seconds safely    Standing Ubsupported with Feet Together  Able to place feet together independently and stand 1 minute safely    From Standing, Reach Forward with Outstretched Arm  Can reach confidently >25 cm (10")    From Standing Position, Pick up Object from Floor  Able to pick up shoe safely and easily    From Standing Position, Turn to  Look Behind Over each Shoulder  Looks behind one side only/other side shows less weight shift    Turn 360 Degrees  Able to turn 360 degrees safely but slowly    Standing Unsupported, Alternately Place Feet on Step/Stool  Able to complete 4 steps without aid or supervision    Standing Unsupported, One Foot in Front  Able to take small step independently and hold 30 seconds    Standing on One Leg  Tries to lift leg/unable to hold 3 seconds but remains standing independently    Total Score  44      Timed Up and Go Test   Normal TUG (seconds)  18.5                   [facility] Adult PT Treatment/Exercise - 07/04/18 0001      Ambulation/Gait   Gait Comments  gait training, 35 ft x8 with practice for heel to toe gait, and improving stride length;        Neuro Re-ed    Neuro Re-ed Details   cone navigation - weaving working on directional changes 4 x 10 ft; exagerated high knee marching over cones with longer  step length - 4 x 10 ft      Exercises   Exercises  Knee/Hip      Knee/Hip Exercises: Standing   Hip Flexion  20 reps;Both;Knee bent    Hip Abduction  20 reps    Other Standing Knee Exercises  Toe taps on 6 in step x20 , no UE support;     Other Standing Knee Exercises  Tandem stance 30 sec x3;  L/R and A/P weight shifts with no UE support x20 each bil;       Knee/Hip Exercises: Seated   Sit to Sand  10 reps;2 sets             PT Education - 07/04/18 1400    Education Details  PT POC, HEP reviewed, Discussed ambulation pattern, and safety with gait.     Person(s) Educated  Patient    Methods  Explanation;Demonstration;Verbal cues    Comprehension  Verbalized understanding;Need further instruction       PT Short Term Goals - 07/04/18 1402      PT SHORT TERM GOAL #1   Title  Pt to be independent with initial HEP     Time  3    Period  Weeks    Status  Achieved      PT SHORT TERM GOAL #2   Title  Pt to demo ability for sit to stand from chair on first  attempt, with proper mechanics, and use of 1 UE only.     Time  3    Period  Weeks    Status  Achieved      PT SHORT TERM GOAL #3   Title  Pt will demo ability for correct use/sequencing for use of SPC with gait, to decrease fall risk.     Time  3    Period  Weeks    Status  Partially Met        PT Long Term Goals - 07/04/18 1402      PT LONG TERM GOAL #1   Title  Pt to demo improved TUG score by at least 3 seconds (from baseline) to improve fall risk.     Time  8    Period  Weeks    Status  Achieved      PT LONG TERM GOAL #2   Title  Pt to demo improved strength of Bil LEs to at least 4/5 to improve stability, pain, and gait.     Time  8    Period  Weeks    Status  Partially Met      PT LONG TERM GOAL #3   Title  Pt to demo improved mechanics for ambulation, to be [facility] for pt age and dx, with improved presence of TKE , heel strike, speed, with balance WFL, and use of LRAD.     Time  8    Period  Weeks    Status  Partially Met            Plan - 07/04/18 1409    Clinical Impression Statement  Pt showing improvements with gait pattern and stride length, pt able to recongnize deficits, and when she needs to take longer steps. Pt with improving consistency with gait, and is requiring less cueing (still does requires min-mod verbal cueing for improving heel strike). Pt with difficulty with sequencing with SPC, but use does not seem to improve overall balance. Pt with improved scores today on TUG and BERG. She continues to have difficulty  with starting movement, motor planning, as well as dynamic balance, direction changes, and stepping over obstacles. Pt to benefit from continued care to cotninue to work towards LTGs and to improve safey with gait and functional activity.     Rehab Potential  Good    PT Frequency  2x / week    PT Duration  4 weeks    PT Treatment/Interventions  ADLs/Self Care Home Management;Cryotherapy;Electrical Stimulation;Moist Heat;Therapeutic  activities;Functional mobility training;Stair training;Gait training;DME Instruction;Ultrasound;Therapeutic exercise;Balance training;Neuromuscular re-education;Patient/family education;Orthotic Fit/Training;Dry needling;Passive range of motion;Manual techniques;Taping    Consulted and Agree with Plan of Care  Patient       Patient will benefit from skilled therapeutic intervention in order to improve the following deficits and impairments:  Abnormal gait, Decreased endurance, Hypomobility, Decreased activity tolerance, Decreased knowledge of use of DME, Decreased strength, Impaired UE functional use, Pain, Increased muscle spasms, Difficulty walking, Decreased mobility, Decreased balance, Decreased range of motion, Impaired perceived functional ability, Improper body mechanics, Impaired flexibility, Decreased safety awareness, Decreased coordination  Visit Diagnosis: Muscle weakness (generalized) - Plan: PT plan of care cert/re-cert  Other abnormalities of gait and mobility - Plan: PT plan of care cert/re-cert     Problem List Patient Active Problem List   Diagnosis Date Noted  . Anxiety 05/07/2018  . Fatigue 04/20/2018  . Osteoarthritis 04/20/2018  . Palpitations 04/20/2018  . Cardiomegaly 04/20/2018  . Lesion of liver 04/20/2018  . Lung nodule 04/20/2018  . Muscle weakness of lower extremity 01/17/2015  . Parkinson disease ([place]) 01/17/2015  . Fibroid uterus 11/13/2012  . Benign colonic polyp 10/02/2012    Lyndee Hensen, PT, DPT 2:21 PM  07/04/18    [place] [place], [place], [address] Phone: [phone]   Fax:  [phone]  Name: [name] [name] MRN: [id number] Date of Birth: 12-04-1950

## 2018-07-07 ENCOUNTER — Ambulatory Visit (INDEPENDENT_AMBULATORY_CARE_PROVIDER_SITE_OTHER): Payer: Medicare PPO | Admitting: Physical Therapy

## 2018-07-07 DIAGNOSIS — M6281 Muscle weakness (generalized): Secondary | ICD-10-CM

## 2018-07-07 DIAGNOSIS — R2689 Other abnormalities of gait and mobility: Secondary | ICD-10-CM | POA: Diagnosis not present

## 2018-07-08 ENCOUNTER — Ambulatory Visit (HOSPITAL_BASED_OUTPATIENT_CLINIC_OR_DEPARTMENT_OTHER)
Admission: RE | Admit: 2018-07-08 | Discharge: 2018-07-08 | Disposition: A | Discharge status: Home / Self Care | Payer: Medicare PPO | Source: Ambulatory Visit | Attending: Hematology & Oncology | Admitting: Hematology & Oncology

## 2018-07-08 ENCOUNTER — Encounter (HOSPITAL_BASED_OUTPATIENT_CLINIC_OR_DEPARTMENT_OTHER): Payer: Self-pay

## 2018-07-08 DIAGNOSIS — K5730 Diverticulosis of large intestine without perforation or abscess without bleeding: Secondary | ICD-10-CM | POA: Insufficient documentation

## 2018-07-08 DIAGNOSIS — R918 Other nonspecific abnormal finding of lung field: Secondary | ICD-10-CM | POA: Diagnosis not present

## 2018-07-08 DIAGNOSIS — K7689 Other specified diseases of liver: Secondary | ICD-10-CM | POA: Diagnosis not present

## 2018-07-08 DIAGNOSIS — R911 Solitary pulmonary nodule: Secondary | ICD-10-CM | POA: Diagnosis not present

## 2018-07-08 DIAGNOSIS — D1803 Hemangioma of intra-abdominal structures: Secondary | ICD-10-CM | POA: Insufficient documentation

## 2018-07-08 MED ORDER — IOPAMIDOL (ISOVUE-300) INJECTION 61%
100.0000 mL | Freq: Once | INTRAVENOUS | Status: AC | PRN
  Administered 2018-07-08: 100 mL via INTRAVENOUS

## 2018-07-09 ENCOUNTER — Encounter: Payer: Medicare PPO | Admitting: Physical Therapy

## 2018-07-09 ENCOUNTER — Telehealth: Payer: Self-pay | Admitting: *Deleted

## 2018-07-09 NOTE — Telephone Encounter (Addendum)
Patient aware of results  ----- Message from Volanda Napoleon, MD sent at 07/09/2018  9:44 AM EDT ----- Call - NO cancer on the scan!!! [facility]

## 2018-07-14 ENCOUNTER — Encounter: Payer: Medicare PPO | Admitting: Physical Therapy

## 2018-07-16 ENCOUNTER — Ambulatory Visit (INDEPENDENT_AMBULATORY_CARE_PROVIDER_SITE_OTHER): Payer: Medicare PPO | Admitting: Physical Therapy

## 2018-07-16 DIAGNOSIS — R2689 Other abnormalities of gait and mobility: Secondary | ICD-10-CM | POA: Diagnosis not present

## 2018-07-16 DIAGNOSIS — M6281 Muscle weakness (generalized): Secondary | ICD-10-CM

## 2018-07-16 NOTE — Patient Instructions (Signed)
Access Code: [email or web address]  URL: [email or web address]  Date: 07/16/2018  Prepared by: Lyndee Hensen   Exercises  Sit to Stand - 10 reps - 1 sets - 2x daily  Standing Hip Abduction - 10 reps - 2 sets - 2x daily  Standing Marching - 10 reps - 2 sets - 2x daily  Standing Heel Raise - 10 reps - 2 sets - 2x daily  Seated Knee Extension AROM - 10 reps - 2 sets - 2x daily

## 2018-07-18 ENCOUNTER — Encounter: Payer: Self-pay | Admitting: Physical Therapy

## 2018-07-18 NOTE — Therapy (Signed)
[facility] [address] [place], [place], [address] Phone: [phone]   Fax:  [phone]  Physical Therapy Treatment  Patient Details  Name: [name] [name] MRN: [id number] Date of Birth: 08-28-1951 Referring Provider: Dimas Chyle   Encounter Date: 07/16/2018  PT End of Session - 07/18/18 2253    Visit Number  8    Number of Visits  16    Date for PT Re-Evaluation  07/30/18    Authorization Type  [employer]    PT Start Time  1430    PT Stop Time  1515    PT Time Calculation (min)  45 min    Activity Tolerance  Patient tolerated treatment well    Behavior During Therapy  [facility] for tasks assessed/performed       Past Medical History:  Diagnosis Date  . Fibroid   . GERD (gastroesophageal reflux disease)   . Muscle weakness of lower extremity 01/17/2015  . Osteopenia   . Parkinson disease ([place]) 01/17/2015    Past Surgical History:  Procedure Laterality Date  . FINGER SURGERY    . MOUTH SURGERY    . TOE SURGERY    . TONSILLECTOMY    . TUBAL LIGATION     x2    There were no vitals filed for this visit.  Subjective Assessment - 07/18/18 2252    Subjective  Pt states that she has days where she feels more anxious, and she also feels like she has a harder time moving and "getting going" on these days.     Currently in Pain?  No/denies                       [facility] Adult PT Treatment/Exercise - 07/18/18 0001      Ambulation/Gait   Gait Comments  gait training, 35 ft x8 with practice for heel to toe gait;  with direction changes.       Exercises   Exercises  Knee/Hip      Knee/Hip Exercises: Standing   Heel Raises  20 reps    Hip Flexion  20 reps;Both;Knee bent    Hip Abduction  20 reps    Forward Step Up  10 reps;Both    Forward Step Up Limitations  1 UE support    Stairs  5 steps with reciprocol pattern x6, with 2 UE support;     Other Standing Knee Exercises  Toe taps on 6 in step x20 , no UE support;   Tandem stance 30 sec x2 bil;     Other Standing Knee Exercises  L/R and A/P weight shifts with no UE support x20 each on [employer]; Marching on [employer]      Knee/Hip Exercises: Seated   Long [employer]  20 reps;Both    Sit to Sand  10 reps   21 in mat height no UE; x5 with 18 in height 1 UE            PT Education - 07/18/18 2253    Education Details  HEP updated     Person(s) Educated  Patient    Methods  Explanation    Comprehension  Verbalized understanding       PT Short Term Goals - 07/18/18 2254      PT SHORT TERM GOAL #1   Title  Pt to be independent with initial HEP     Time  3    Period  Weeks    Status  Achieved      PT SHORT TERM GOAL #2   Title  Pt to demo ability for sit to stand from chair on first attempt, with proper mechanics, and use of 1 UE only.     Time  3    Period  Weeks    Status  Achieved      PT SHORT TERM GOAL #3   Title  Pt will demo ability for correct use/sequencing for use of SPC with gait, to decrease fall risk.     Time  3    Period  Weeks    Status  Achieved        PT Long Term Goals - 07/04/18 1402      PT LONG TERM GOAL #1   Title  Pt to demo improved TUG score by at least 3 seconds (from baseline) to improve fall risk.     Time  8    Period  Weeks    Status  Achieved      PT LONG TERM GOAL #2   Title  Pt to demo improved strength of Bil LEs to at least 4/5 to improve stability, pain, and gait.     Time  8    Period  Weeks    Status  Partially Met      PT LONG TERM GOAL #3   Title  Pt to demo improved mechanics for ambulation, to be [facility] for pt age and dx, with improved presence of TKE , heel strike, speed, with balance WFL, and use of LRAD.     Time  8    Period  Weeks    Status  Partially Met            Plan - 07/18/18 2255    Clinical Impression Statement  Pt very congnisent of her deficits, and is able to state when she has poor step height and foot clearance. She has been able to improve strike length  consistently during sessions. From what she describes at home, she may have variance in this , depending on how she is feeling that day and her anxiety level. Pt making good improvements, despite only attending on average 1x/wk vs 2. Plan to work towards d/c next week.     Rehab Potential  Good    PT Frequency  2x / week    PT Duration  4 weeks    PT Treatment/Interventions  ADLs/Self Care Home Management;Cryotherapy;Electrical Stimulation;Moist Heat;Therapeutic activities;Functional mobility training;Stair training;Gait training;DME Instruction;Ultrasound;Therapeutic exercise;Balance training;Neuromuscular re-education;Patient/family education;Orthotic Fit/Training;Dry needling;Passive range of motion;Manual techniques;Taping    Consulted and Agree with Plan of Care  Patient       Patient will benefit from skilled therapeutic intervention in order to improve the following deficits and impairments:  Abnormal gait, Decreased endurance, Hypomobility, Decreased activity tolerance, Decreased knowledge of use of DME, Decreased strength, Impaired UE functional use, Pain, Increased muscle spasms, Difficulty walking, Decreased mobility, Decreased balance, Decreased range of motion, Impaired perceived functional ability, Improper body mechanics, Impaired flexibility, Decreased safety awareness, Decreased coordination  Visit Diagnosis: Muscle weakness (generalized)  Other abnormalities of gait and mobility     Problem List Patient Active Problem List   Diagnosis Date Noted  . Anxiety 05/07/2018  . Fatigue 04/20/2018  . Osteoarthritis 04/20/2018  . Palpitations 04/20/2018  . Cardiomegaly 04/20/2018  . Lesion of liver 04/20/2018  . Lung nodule 04/20/2018  . Muscle weakness of lower extremity 01/17/2015  . Parkinson disease ([place]) 01/17/2015  . Fibroid uterus 11/13/2012  .  Benign colonic polyp 10/02/2012   Lyndee Hensen, PT, DPT 11:00 PM  07/18/18    [place], [place], [address] Phone: [phone]   Fax:  [phone]  Name: [name] [name] MRN: [id number] Date of Birth: Jan 11, 1951

## 2018-07-21 ENCOUNTER — Encounter: Payer: Medicare PPO | Admitting: Physical Therapy

## 2018-07-21 ENCOUNTER — Other Ambulatory Visit: Payer: Self-pay | Admitting: *Deleted

## 2018-07-21 DIAGNOSIS — K769 Liver disease, unspecified: Secondary | ICD-10-CM

## 2018-07-22 ENCOUNTER — Inpatient Hospital Stay: Payer: Medicare PPO

## 2018-07-22 ENCOUNTER — Inpatient Hospital Stay: Payer: Medicare PPO | Attending: Hematology & Oncology | Admitting: Hematology & Oncology

## 2018-07-23 ENCOUNTER — Encounter: Payer: Medicare PPO | Admitting: Physical Therapy

## 2018-07-24 ENCOUNTER — Encounter: Payer: Self-pay | Admitting: Physical Therapy

## 2018-07-24 NOTE — Therapy (Signed)
[facility] [address] [place], [place], [address] Phone: [phone]   Fax:  [phone]  Physical Therapy Treatment  Patient Details  Name: [name] [name] MRN: [id number] Date of Birth: 1950/12/06 Referring Provider (PT): Dimas Chyle   Encounter Date: 07/07/2018  PT End of Session - 07/24/18 1252    Visit Number  8    Number of Visits  16    Date for PT Re-Evaluation  07/30/18    Authorization Type  [employer]    PT Start Time  4142    PT Stop Time  1515    PT Time Calculation (min)  43 min    Activity Tolerance  Patient tolerated treatment well    Behavior During Therapy  [facility] for tasks assessed/performed       Past Medical History:  Diagnosis Date  . Fibroid   . GERD (gastroesophageal reflux disease)   . Muscle weakness of lower extremity 01/17/2015  . Osteopenia   . Parkinson disease ([place]) 01/17/2015    Past Surgical History:  Procedure Laterality Date  . FINGER SURGERY    . MOUTH SURGERY    . TOE SURGERY    . TONSILLECTOMY    . TUBAL LIGATION     x2    There were no vitals filed for this visit.  Subjective Assessment - 07/24/18 1251    Subjective  Pt with no new complaints.     Limitations  Lifting;Standing;Walking;House hold activities    Patient Stated Goals  Improve LE strength, balance, walking.     Currently in Pain?  No/denies    Pain Score  0-No pain                       [facility] Adult PT Treatment/Exercise - 07/24/18 0001      Ambulation/Gait   Gait Comments  gait training, 35 ft x8 with practice for heel to toe gait, and improving stride length;        Neuro Re-ed    Neuro Re-ed Details   cone navigation - weaving working on directional changes 4 x 10 ft; exagerated high knee marching over cones with longer step length - 4 x 10 ft      Exercises   Exercises  Knee/Hip      Knee/Hip Exercises: Aerobic   Stationary Bike  L1 x 5 min       Knee/Hip Exercises: Standing   Hip Flexion   20 reps;Both;Knee bent    Hip Abduction  20 reps    Forward Step Up  10 reps;Both    Forward Step Up Limitations  1 UE support    Stairs  5 steps with reciprocol pattern x4, with 2 UE support;     Other Standing Knee Exercises  Toe taps on 6 in step x20 , no UE support;     Other Standing Knee Exercises  Diagonal Weight shifts x20 bil;   L/R and A/P weight shifts with no UE support x20 each bil;       Knee/Hip Exercises: Seated   Sit to Sand  15 reps   21 in mat height no UE; x5 with 18 in height 1 UE              PT Short Term Goals - 07/18/18 2254      PT SHORT TERM GOAL #1   Title  Pt to be independent with initial HEP     Time  3  Period  Weeks    Status  Achieved      PT SHORT TERM GOAL #2   Title  Pt to demo ability for sit to stand from chair on first attempt, with proper mechanics, and use of 1 UE only.     Time  3    Period  Weeks    Status  Achieved      PT SHORT TERM GOAL #3   Title  Pt will demo ability for correct use/sequencing for use of SPC with gait, to decrease fall risk.     Time  3    Period  Weeks    Status  Achieved        PT Long Term Goals - 07/04/18 1402      PT LONG TERM GOAL #1   Title  Pt to demo improved TUG score by at least 3 seconds (from baseline) to improve fall risk.     Time  8    Period  Weeks    Status  Achieved      PT LONG TERM GOAL #2   Title  Pt to demo improved strength of Bil LEs to at least 4/5 to improve stability, pain, and gait.     Time  8    Period  Weeks    Status  Partially Met      PT LONG TERM GOAL #3   Title  Pt to demo improved mechanics for ambulation, to be [facility] for pt age and dx, with improved presence of TKE , heel strike, speed, with balance WFL, and use of LRAD.     Time  8    Period  Weeks    Status  Partially Met            Plan - 07/24/18 1255    Clinical Impression Statement  Pt with improving ability and ease for sit to stand, on mat table, with decreased UE support. Pt able  to independently voice need to improve step height on stairs. Pt continues to have vairable difficulty with initiation of movement.     Rehab Potential  Good    PT Frequency  2x / week    PT Duration  4 weeks    PT Treatment/Interventions  ADLs/Self Care Home Management;Cryotherapy;Electrical Stimulation;Moist Heat;Therapeutic activities;Functional mobility training;Stair training;Gait training;DME Instruction;Ultrasound;Therapeutic exercise;Balance training;Neuromuscular re-education;Patient/family education;Orthotic Fit/Training;Dry needling;Passive range of motion;Manual techniques;Taping    Consulted and Agree with Plan of Care  Patient       Patient will benefit from skilled therapeutic intervention in order to improve the following deficits and impairments:  Abnormal gait, Decreased endurance, Hypomobility, Decreased activity tolerance, Decreased knowledge of use of DME, Decreased strength, Impaired UE functional use, Pain, Increased muscle spasms, Difficulty walking, Decreased mobility, Decreased balance, Decreased range of motion, Impaired perceived functional ability, Improper body mechanics, Impaired flexibility, Decreased safety awareness, Decreased coordination  Visit Diagnosis: Muscle weakness (generalized)  Other abnormalities of gait and mobility     Problem List Patient Active Problem List   Diagnosis Date Noted  . Anxiety 05/07/2018  . Fatigue 04/20/2018  . Osteoarthritis 04/20/2018  . Palpitations 04/20/2018  . Cardiomegaly 04/20/2018  . Lesion of liver 04/20/2018  . Lung nodule 04/20/2018  . Muscle weakness of lower extremity 01/17/2015  . Parkinson disease ([place]) 01/17/2015  . Fibroid uterus 11/13/2012  . Benign colonic polyp 10/02/2012     Lyndee Hensen, PT, DPT 12:58 PM  07/24/18    [facility]  [place], [place], [address] Phone: [phone]   Fax:  [phone]  Name: [name] [name] MRN:  [id number] Date of Birth: 02-Mar-1951

## 2018-07-28 ENCOUNTER — Encounter: Payer: Self-pay | Admitting: Physical Therapy

## 2018-07-28 ENCOUNTER — Ambulatory Visit (INDEPENDENT_AMBULATORY_CARE_PROVIDER_SITE_OTHER): Payer: Medicare PPO | Admitting: Physical Therapy

## 2018-07-28 DIAGNOSIS — R2689 Other abnormalities of gait and mobility: Secondary | ICD-10-CM

## 2018-07-28 DIAGNOSIS — M6281 Muscle weakness (generalized): Secondary | ICD-10-CM

## 2018-07-28 NOTE — Therapy (Signed)
[facility] [address] [place], [place], [address] Phone: [phone]   Fax:  [phone]  Physical Therapy Treatment/Discharge   Patient Details  Name: [name] [name] MRN: [id number] Date of Birth: [birth year] Referring Provider (PT): Dimas Chyle   Encounter Date: 07/28/2018  PT End of Session - 07/28/18 1512    Visit Number  10    Number of Visits  16    Date for PT Re-Evaluation  07/30/18    Authorization Type  [employer]    PT Start Time  1430    PT Stop Time  1511    PT Time Calculation (min)  41 min    Activity Tolerance  Patient tolerated treatment well    Behavior During Therapy  [facility] for tasks assessed/performed       Past Medical History:  Diagnosis Date  . Fibroid   . GERD (gastroesophageal reflux disease)   . Muscle weakness of lower extremity 01/17/2015  . Osteopenia   . Parkinson disease ([place]) 01/17/2015    Past Surgical History:  Procedure Laterality Date  . FINGER SURGERY    . MOUTH SURGERY    . TOE SURGERY    . TONSILLECTOMY    . TUBAL LIGATION     x2    There were no vitals filed for this visit.  Subjective Assessment - 07/28/18 1508    Subjective  Pt with no new complaints. States she is having an "off" day. She feels like she is "telling her feet what to do, but they wont do it". She states this has been her baseline, variable stiffness, for several months now.     Currently in Pain?  No/denies    Pain Score  0-No pain         [facility] PT Assessment - 07/28/18 0001      Berg Balance Test   Sit to Stand  Able to stand without using hands and stabilize independently    Standing Unsupported  Able to stand safely 2 minutes    Sitting with Back Unsupported but Feet Supported on Floor or Stool  Able to sit safely and securely 2 minutes    Stand to Sit  Sits safely with minimal use of hands    Transfers  Able to transfer safely, minor use of hands    Standing Unsupported with Eyes Closed  Able to stand  10 seconds safely    Standing Ubsupported with Feet Together  Able to place feet together independently and stand 1 minute safely    From Standing, Reach Forward with Outstretched Arm  Can reach confidently >25 cm (10")    From Standing Position, Pick up Object from Floor  Able to pick up shoe safely and easily    From Standing Position, Turn to Look Behind Over each Shoulder  Looks behind one side only/other side shows less weight shift    Turn 360 Degrees  Able to turn 360 degrees safely but slowly    Standing Unsupported, Alternately Place Feet on Step/Stool  Able to complete 4 steps without aid or supervision    Standing Unsupported, One Foot in Front  Able to take small step independently and hold 30 seconds    Standing on One Leg  Tries to lift leg/unable to hold 3 seconds but remains standing independently    Total Score  46      Timed Up and Go Test   Normal TUG (seconds)  15.7   Best of 3: 18.75, 14.06,  13.30;                   [facility] Adult PT Treatment/Exercise - 07/28/18 0001      Ambulation/Gait   Gait Comments  35 ft x8 with L/R head turns;  x6 with practice for long stride length and heel strike.       Neuro Re-ed    Neuro Re-ed Details   cone navigation - weaving working on directional changes 4 x 10 ft; exagerated high knee marching over cones with longer step length - 4 x 10 ft      Exercises   Exercises  Knee/Hip      Knee/Hip Exercises: Aerobic   Stationary Bike  --      Knee/Hip Exercises: Standing   Hip Flexion  20 reps;Both;Knee bent    Hip Abduction  20 reps    Forward Step Up  10 reps;Both    Forward Step Up Limitations  1 UE support    Stairs  5 steps with reciprocol pattern x4, with 2 UE support;     Other Standing Knee Exercises  --    Other Standing Knee Exercises  --      Knee/Hip Exercises: Seated   Sit to Sand  15 reps             PT Education - 07/28/18 1512    Education Details  HEP review, Home safety reviewed.      Person(s) Educated  Patient    Methods  Explanation    Comprehension  Verbalized understanding       PT Short Term Goals - 07/18/18 2254      PT SHORT TERM GOAL #1   Title  Pt to be independent with initial HEP     Time  3    Period  Weeks    Status  Achieved      PT SHORT TERM GOAL #2   Title  Pt to demo ability for sit to stand from chair on first attempt, with proper mechanics, and use of 1 UE only.     Time  3    Period  Weeks    Status  Achieved      PT SHORT TERM GOAL #3   Title  Pt will demo ability for correct use/sequencing for use of SPC with gait, to decrease fall risk.     Time  3    Period  Weeks    Status  Achieved        PT Long Term Goals - 07/28/18 1514      PT LONG TERM GOAL #1   Title  Pt to demo improved TUG score by at least 3 seconds (from baseline) to improve fall risk.     Time  8    Period  Weeks    Status  Achieved      PT LONG TERM GOAL #2   Title  Pt to demo improved strength of Bil LEs to at least 4/5 to improve stability, pain, and gait.     Time  8    Period  Weeks    Status  Achieved      PT LONG TERM GOAL #3   Title  Pt to demo improved mechanics for ambulation, to be [facility] for pt age and dx, with improved presence of TKE , heel strike, speed, with balance WFL, and use of LRAD.     Time  8    Period  Weeks  Status  Achieved            Plan - 07/28/18 1609    Clinical Impression Statement  Pt has met goals for PT and is ready for d/c to HEP. She has improved scores on BERG, Significant improvement on TUG. She has good awareness of her deficits, and is able to verbalize need to take longer steps at times. During sessions, she demonstrates ability for longer strike length, still has mild limitation with heel strike, but has significant improvement of speed, efficiency, and safety with gait. Pt with improved LE strength, and ability for transfers, as well as good understanding of HEP. Pt does have variability in her mobility, and  initiating movement, which is likely due to her parkinsons, but overall, pt has made good improvements .Pt was seen for 10 visits, since 05/07/18, she did cancel several appts.     Rehab Potential  Good    PT Frequency  2x / week    PT Duration  4 weeks    PT Treatment/Interventions  ADLs/Self Care Home Management;Cryotherapy;Electrical Stimulation;Moist Heat;Therapeutic activities;Functional mobility training;Stair training;Gait training;DME Instruction;Ultrasound;Therapeutic exercise;Balance training;Neuromuscular re-education;Patient/family education;Orthotic Fit/Training;Dry needling;Passive range of motion;Manual techniques;Taping    Consulted and Agree with Plan of Care  Patient       Patient will benefit from skilled therapeutic intervention in order to improve the following deficits and impairments:  Abnormal gait, Decreased endurance, Hypomobility, Decreased activity tolerance, Decreased knowledge of use of DME, Decreased strength, Impaired UE functional use, Pain, Increased muscle spasms, Difficulty walking, Decreased mobility, Decreased balance, Decreased range of motion, Impaired perceived functional ability, Improper body mechanics, Impaired flexibility, Decreased safety awareness, Decreased coordination  Visit Diagnosis: Muscle weakness (generalized)  Other abnormalities of gait and mobility     Problem List Patient Active Problem List   Diagnosis Date Noted  . Anxiety 05/07/2018  . Fatigue 04/20/2018  . Osteoarthritis 04/20/2018  . Palpitations 04/20/2018  . Cardiomegaly 04/20/2018  . Lesion of liver 04/20/2018  . Lung nodule 04/20/2018  . Muscle weakness of lower extremity 01/17/2015  . Parkinson disease ([place]) 01/17/2015  . Fibroid uterus 11/13/2012  . Benign colonic polyp 10/02/2012    Lyndee Hensen, PT, DPT 4:16 PM  07/28/18     [facility] [place], [place], [address] Phone: [phone]   Fax:   [phone]  Name: [name] [name] MRN: [id number] Date of Birth: 1951-03-11     PHYSICAL THERAPY DISCHARGE SUMMARY  Visits from Start of Care: 10   Plan: Patient agrees to discharge.  Patient goals were met. Patient is being discharged due to meeting the stated rehab goals.  ?????      Lyndee Hensen, PT, DPT 4:16 PM  07/28/18

## 2018-09-15 DIAGNOSIS — G20 Parkinson's disease: Secondary | ICD-10-CM | POA: Diagnosis not present

## 2018-09-17 ENCOUNTER — Encounter: Payer: Self-pay | Admitting: Family Medicine

## 2018-09-17 ENCOUNTER — Ambulatory Visit (INDEPENDENT_AMBULATORY_CARE_PROVIDER_SITE_OTHER): Payer: Medicare PPO | Admitting: Family Medicine

## 2018-09-17 VITALS — BP 122/76 | HR 75 | Temp 98.1°F | Ht 67.0 in | Wt 124.8 lb

## 2018-09-17 DIAGNOSIS — G20A1 Parkinson's disease without dyskinesia, without mention of fluctuations: Secondary | ICD-10-CM

## 2018-09-17 DIAGNOSIS — G20 Parkinson's disease: Secondary | ICD-10-CM

## 2018-09-17 DIAGNOSIS — R5383 Other fatigue: Secondary | ICD-10-CM | POA: Diagnosis not present

## 2018-09-17 DIAGNOSIS — F419 Anxiety disorder, unspecified: Secondary | ICD-10-CM

## 2018-09-17 DIAGNOSIS — I517 Cardiomegaly: Secondary | ICD-10-CM

## 2018-09-17 MED ORDER — DIAZEPAM 2 MG PO TABS: 2.0000 mg | ORAL_TABLET | Freq: Two times a day (BID) | ORAL | 0 refills | Status: DC | PRN

## 2018-09-17 NOTE — Patient Instructions (Signed)
It was very nice to see you today!  I will send in low-dose Valium.  Please use this as needed for situations that cause you anxiety.  Please start the Celexa daily if you have persistent anxiety symptoms.  [facility] in 3 to 6 months, or sooner as needed.  Take care, Dr Jerline Pain

## 2018-09-17 NOTE — Assessment & Plan Note (Signed)
Stable.  Lab work-up negative.  Echocardiogram pending.

## 2018-09-17 NOTE — Assessment & Plan Note (Signed)
Echocardiogram pending as noted above.

## 2018-09-17 NOTE — Assessment & Plan Note (Signed)
Stable.  Currently undergoing evaluation by [facility] neurology.

## 2018-09-17 NOTE — Progress Notes (Signed)
   Subjective:  [name] [name] is a 67 y.o. female who presents today with a chief complaint of anxiety follow up.   HPI:  Anxiety, chronic problem Last seen about 4 months ago for this.  At that point, we had started her on Celexa 10 mg daily.  Patient never started this medication.  Symptoms have been manageable since then.  She does not want to start daily medication.  She is concerned because she is very anxious about an upcoming flight and she request as needed medication for this.  Depression screen [facility] 2/9 09/17/2018  Decreased Interest 0  Down, Depressed, Hopeless 0  PHQ - 2 Score 0  Altered sleeping 0  Tired, decreased energy 2  Change in appetite 0  Feeling bad or failure about yourself  0  Trouble concentrating 0  Moving slowly or fidgety/restless 3  Suicidal thoughts 0  PHQ-9 Score 5  Difficult doing work/chores Somewhat difficult   GAD 7 : Generalized Anxiety Score 09/17/2018  Nervous, Anxious, on Edge 1  Control/stop worrying 0  Worry too much - different things 0  Trouble relaxing 1  Restless 0  Easily annoyed or irritable 0  Afraid - awful might happen 0  Total GAD 7 Score 2  Anxiety Difficulty Somewhat difficult   ROS: No SI or HI.  Parkinson's disease, chronic problem Currently seeking second opinion from [facility] neurology.  Symptoms are stable.  Fatigue, chronic problem Symptoms are stable since her last visit.  Lab evaluation was essentially negative at that time.  Echocardiogram is pending.  She takes over-the-counter vitamins which seemed to help.  ROS: Per HPI  PMH: She reports that she has never smoked. She has never used smokeless tobacco. She reports that she does not drink alcohol or use drugs.  Objective:  Physical Exam: BP 122/76 (BP Location: Left Arm, Patient Position: Sitting, Cuff Size: Normal)   Pulse 75   Temp 98.1 F (36.7 C) (Oral)   Ht 5\' 7"  (1.702 m)   Wt 124 lb 12.8 oz (56.6 kg)   SpO2 98%   BMI 19.55 kg/m   Gen:  NAD, resting comfortably CV: RRR with no murmurs appreciated Pulm: NWOB, CTAB with no crackles, wheezes, or rhonchi GI: Normal bowel sounds present. Soft, Nontender, Nondistended. MSK: No edema, cyanosis, or clubbing noted Skin: Warm, dry Neuro: Grossly normal, moves all extremities Psych: Normal affect and thought content   Assessment/Plan:  Parkinson disease Stable.  Currently undergoing evaluation by [facility] neurology.  Fatigue Stable.  Lab work-up negative.  Echocardiogram pending.  Cardiomegaly Echocardiogram pending as noted above.  Anxiety Discussed treatment options with patient.  We will start very low-dose Valium 2 mg as needed for anxiety provoking situations such as flight.  Discussed with patient potential risks of this medication including addiction, chemical dependence, and respiratory depression.  Recommended patient start Celexa 10 mg daily as well if she has persistent anxiety symptoms, even though she is reluctant to take a daily medication.  She will follow-up with me in 3 to 6 months.  Algis Greenhouse. Jerline Pain, MD 09/17/2018 1:56 PM

## 2018-09-17 NOTE — Assessment & Plan Note (Addendum)
Discussed treatment options with patient.  We will start very low-dose Valium 2 mg as needed for anxiety provoking situations such as flight.  Discussed with patient potential risks of this medication including addiction, chemical dependence, and respiratory depression.  Recommended patient start Celexa 10 mg daily as well if she has persistent anxiety symptoms, even though she is reluctant to take a daily medication.  She will follow-up with me in 3 to 6 months.

## 2018-11-26 ENCOUNTER — Ambulatory Visit: Payer: Medicare PPO

## 2018-12-01 DIAGNOSIS — H40013 Open angle with borderline findings, low risk, bilateral: Secondary | ICD-10-CM | POA: Diagnosis not present

## 2019-01-26 ENCOUNTER — Encounter: Payer: Medicare PPO | Admitting: Family Medicine

## 2019-03-02 ENCOUNTER — Ambulatory Visit (INDEPENDENT_AMBULATORY_CARE_PROVIDER_SITE_OTHER): Payer: Medicare PPO | Admitting: Family Medicine

## 2019-03-02 ENCOUNTER — Encounter: Payer: Self-pay | Admitting: Family Medicine

## 2019-03-02 DIAGNOSIS — G20 Parkinson's disease: Secondary | ICD-10-CM | POA: Diagnosis not present

## 2019-03-02 DIAGNOSIS — F419 Anxiety disorder, unspecified: Secondary | ICD-10-CM | POA: Diagnosis not present

## 2019-03-02 DIAGNOSIS — K5900 Constipation, unspecified: Secondary | ICD-10-CM | POA: Diagnosis not present

## 2019-03-02 DIAGNOSIS — J302 Other seasonal allergic rhinitis: Secondary | ICD-10-CM | POA: Diagnosis not present

## 2019-03-02 DIAGNOSIS — G20A1 Parkinson's disease without dyskinesia, without mention of fluctuations: Secondary | ICD-10-CM

## 2019-03-02 NOTE — Assessment & Plan Note (Signed)
Stable off Celexa.  Continue low-dose Valium as needed.

## 2019-03-02 NOTE — Assessment & Plan Note (Signed)
Stable.  Continue Sinemet per neurology.

## 2019-03-02 NOTE — Assessment & Plan Note (Signed)
Recommended daily MiraLAX as needed to have 1-2 soft bowel movements.  Recommended against frequent use of senna.  Encouraged good oral hydration.  Recommended diet high in fiber with supplementation as needed.

## 2019-03-02 NOTE — Progress Notes (Signed)
    Chief Complaint:  [name] [name] is a 68 y.o. female who presents today for a virtual office visit with a chief complaint of constipation.   Assessment/Plan:  Seasonal allergies Stable.  Recommended use of Claritin or Zyrtec as needed.  Parkinson disease Stable.  Continue Sinemet per neurology.  Constipation Recommended daily MiraLAX as needed to have 1-2 soft bowel movements.  Recommended against frequent use of senna.  Encouraged good oral hydration.  Recommended diet high in fiber with supplementation as needed.  Anxiety Stable off Celexa.  Continue low-dose Valium as needed.     Subjective:  HPI:  Her stable, chronic medical conditions are outlined below:  Constipation  Chronic problem.  Has been using senna with modest improvement.  Has bowel movement daily however typically is hard stool balls and is difficult to pass.  She has had some associated hemorrhoids as well.  Parkinson disease Was recently started on Sinemet.  Overall symptoms seem to be stable.  Seasonal allergies Uses over-the-counter medications as needed.  Symptoms are stable.  Anxiety Stable off celexa. Uses valium as needed sparingly.   ROS: Per HPI  PMH: She reports that she has never smoked. She has never used smokeless tobacco. She reports that she does not drink alcohol or use drugs.      Objective/Observations  Physical Exam: Gen: NAD, resting comfortably Pulm: Normal work of breathing Neuro: Grossly normal, moves all extremities Psych: Normal affect and thought content  No results found for this or any previous visit (from the past 24 hour(s)).   Virtual Visit via Video   I connected with [name] on 03/02/19 at  2:00 PM EDT by a video enabled telemedicine application and verified that I am speaking with the correct person using two identifiers. I discussed the limitations of evaluation and management by telemedicine and the availability of in person appointments. The  patient expressed understanding and agreed to proceed.   Patient location: Home Provider location: [place] participating in the virtual visit: Myself and Patient     Algis Greenhouse. Jerline Pain, MD 03/02/2019 3:29 PM

## 2019-03-02 NOTE — Assessment & Plan Note (Signed)
Stable.  Recommended use of Claritin or Zyrtec as needed.

## 2019-04-29 ENCOUNTER — Encounter: Payer: Medicare PPO | Admitting: Family Medicine

## 2019-05-14 ENCOUNTER — Ambulatory Visit (INDEPENDENT_AMBULATORY_CARE_PROVIDER_SITE_OTHER): Payer: Medicare PPO | Admitting: Family Medicine

## 2019-05-14 ENCOUNTER — Encounter: Payer: Medicare PPO | Admitting: Family Medicine

## 2019-05-14 DIAGNOSIS — M8949 Other hypertrophic osteoarthropathy, multiple sites: Secondary | ICD-10-CM

## 2019-05-14 DIAGNOSIS — M159 Polyosteoarthritis, unspecified: Secondary | ICD-10-CM

## 2019-05-14 DIAGNOSIS — G20 Parkinson's disease: Secondary | ICD-10-CM | POA: Diagnosis not present

## 2019-05-14 DIAGNOSIS — M150 Primary generalized (osteo)arthritis: Secondary | ICD-10-CM | POA: Diagnosis not present

## 2019-05-14 MED ORDER — PENNSAID 2 % TD SOLN: 1.0000 | Freq: Four times a day (QID) | TRANSDERMAL | 1 refills | Status: DC | PRN

## 2019-05-14 MED ORDER — CARBIDOPA-LEVODOPA 25-250 MG PO TABS: 1.0000 | ORAL_TABLET | Freq: Three times a day (TID) | ORAL | 0 refills | Status: DC

## 2019-05-14 MED ORDER — DICLOFENAC SODIUM 75 MG PO TBEC: 75.0000 mg | DELAYED_RELEASE_TABLET | Freq: Two times a day (BID) | ORAL | 0 refills | Status: DC

## 2019-05-14 NOTE — Assessment & Plan Note (Signed)
Slightly worsened.  She has had some trouble getting into neurology due to [place]. Will send an increased dose of Sinemet with slightly increased daily dose of carbidopa and levodopa.  Advised her to follow-up with neurology soon.

## 2019-05-14 NOTE — Progress Notes (Signed)
    Chief Complaint:  [name] [name] is a 68 y.o. female who presents today for a virtual office visit with a chief complaint of joint pain.   Assessment/Plan:  Osteoarthritis Patient with diffuse osteoarthritis and degenerative changes in her neck.  We will send a prescription for Pennsaid gel.  Also send in prescription for diclofenac.  She can continue taking over-the-counter Tylenol 500 to 1000 mg 3 times daily as needed.  Will place referral to sports medicine for further evaluation and management.  Parkinson disease Slightly worsened.  She has had some trouble getting into neurology due to [place]. Will send an increased dose of Sinemet with slightly increased daily dose of carbidopa and levodopa.  Advised her to follow-up with neurology soon.    Subjective:  HPI:  Osteoarthritis  Chronic problem.  Has been using topical Voltaren and over-the-counter Tylenol which seems to be helping.  Still has significant pain in her neck.  She is interested in possible Botox injections.  Symptoms seem to be worsening.  Worse with certain movements.  Better with medication.  No other obvious alleviating or aggravating factors.  Parkinson disease Chronic problem.  Follows with neurology.  Is currently on Sinemet 25-100 4 times daily.  She has not seen her neurologist for several months.  Is currently out of her Sinemet and would like to have an increased dose. Symptoms seem to be worsening.   ROS: Per HPI  PMH: She reports that she has never smoked. She has never used smokeless tobacco. She reports that she does not drink alcohol or use drugs.      Objective/Observations  Physical Exam: Gen: NAD, resting comfortably Pulm: Normal work of breathing Neuro: Grossly normal, moves all extremities Psych: Normal affect and thought content  Virtual Visit via Video   I connected with [name] [name] on 05/14/19 at  4:20 PM EDT by a video enabled telemedicine application and verified that I am speaking  with the correct person using two identifiers. I discussed the limitations of evaluation and management by telemedicine and the availability of in person appointments. The patient expressed understanding and agreed to proceed.   Patient location: Home Provider location: [place] participating in the virtual visit: Myself and Patient     Algis Greenhouse. Jerline Pain, MD 05/14/2019 9:18 AM

## 2019-05-14 NOTE — Assessment & Plan Note (Signed)
Patient with diffuse osteoarthritis and degenerative changes in her neck.  We will send a prescription for Pennsaid gel.  Also send in prescription for diclofenac.  She can continue taking over-the-counter Tylenol 500 to 1000 mg 3 times daily as needed.  Will place referral to sports medicine for further evaluation and management.

## 2019-05-18 ENCOUNTER — Telehealth: Payer: Self-pay | Admitting: Physical Therapy

## 2019-05-18 NOTE — Telephone Encounter (Signed)
Copied from [place] [id number]. Topic: General - Other >> May 18, 2019  4:42 PM Rainey Pines A wrote: Patient would like to speak with nurse in regards to medication Diclofenac Sodium (PENNSAID) 2 % SOLN  that she was prescribed that costs over $2,000 for her because her insurance does not cover it.

## 2019-05-19 NOTE — Telephone Encounter (Signed)
Medication was approved patient still will have to pay $300 dollars out of pocket.Is there an alternative.

## 2019-05-19 NOTE — Telephone Encounter (Signed)
PA is in progress.

## 2019-05-20 NOTE — Telephone Encounter (Signed)
Only other option would be to try 1% voltaren which is available OTC.  Algis Greenhouse. Jerline Pain, MD 05/20/2019 7:57 PM

## 2019-05-21 NOTE — Telephone Encounter (Signed)
Patient notified and she will buy over the counter.

## 2019-05-25 DIAGNOSIS — Z1231 Encounter for screening mammogram for malignant neoplasm of breast: Secondary | ICD-10-CM | POA: Diagnosis not present

## 2019-05-25 DIAGNOSIS — Z853 Personal history of malignant neoplasm of breast: Secondary | ICD-10-CM | POA: Diagnosis not present

## 2019-05-25 LAB — HM MAMMOGRAPHY

## 2019-06-04 ENCOUNTER — Encounter: Payer: Self-pay | Admitting: Family Medicine

## 2019-06-08 ENCOUNTER — Encounter: Payer: Medicare PPO | Admitting: Family Medicine

## 2019-06-17 ENCOUNTER — Encounter: Payer: Self-pay | Admitting: Family Medicine

## 2019-06-17 ENCOUNTER — Ambulatory Visit (INDEPENDENT_AMBULATORY_CARE_PROVIDER_SITE_OTHER): Payer: Medicare PPO | Admitting: Family Medicine

## 2019-06-17 DIAGNOSIS — M5030 Other cervical disc degeneration, unspecified cervical region: Secondary | ICD-10-CM | POA: Diagnosis not present

## 2019-06-17 MED ORDER — GABAPENTIN 100 MG PO CAPS: 200.0000 mg | ORAL_CAPSULE | Freq: Every day | ORAL | 0 refills | Status: DC

## 2019-06-17 NOTE — Patient Instructions (Signed)
Good to see you.  Ice 20 minutes 2 times daily. Usually after activity and before bed. Exercises 3 times a week.   Gabapentin 200 mg at night  Turmeric 500mg  daily  Tart cherry extract 1200mg  at night Vitamin D 2000 IU daily  See me again for a virtual visit in 4 weeks at that point if not better consider PT or epidurals

## 2019-06-17 NOTE — Progress Notes (Signed)
[facility] [address], [place] [address] Phone: [phone] Subjective:    I'm seeing this patient by the request  of:  Vivi Barrack, MD   CC: neck pain   DUK:[id number]  [name] [name] is a 68 y.o. female coming in with complaint of neck pain for 1 year but has increased in the past 2 months. Patient is staying in the house more and is more sedentary. Has been trying different pillows. Pain occurring in trap muscles. Pain is intermittent. Stress makes pain worse. Was diagnosed with parkinson's in 2016. Is on medication for it. Denies any radiating symptoms. Uses 600mg  IBU daily, Diclofenac 75mg  daily.      Past medical history significant for Parkinson's disease patient did have x-rays taken in June 2019.  Independently visualized by me showing the patient did have degenerative disc disease most pronounced at C3-C6 moderate in severity with foraminal narrowing mostly right from C3 through through C6 and left at C4-C6  Patient did have an MRI from December 2019 down in [place].  This was independently visualized by me as well.  MRI does show that patient did have what appears to be a nonunion fracture of the C2 vertebrae with some surrounding soft tissue inflammation.  No displacement noted.  Moderate degenerative disc disease at multiple levels of the cervical spine with mild foraminal narrowing but no true spinal stenosis.  Past Medical History:  Diagnosis Date  . Fibroid   . GERD (gastroesophageal reflux disease)   . Muscle weakness of lower extremity 01/17/2015  . Osteopenia   . Parkinson disease ([place]) 01/17/2015   Past Surgical History:  Procedure Laterality Date  . FINGER SURGERY    . MOUTH SURGERY    . TOE SURGERY    . TONSILLECTOMY    . TUBAL LIGATION     x2   Social History   Socioeconomic History  . Marital status: Divorced    Spouse name: Not on file  . Number of children: 4  . Years of education: B.A.  . Highest  education level: Not on file  Occupational History  . Occupation: SECRETARY  Social Needs  . Financial resource strain: Not on file  . Food insecurity    Worry: Not on file    Inability: Not on file  . Transportation needs    Medical: Not on file    Non-medical: Not on file  Tobacco Use  . Smoking status: Never Smoker  . Smokeless tobacco: Never Used  Substance and Sexual Activity  . Alcohol use: No    Alcohol/week: 0.0 standard drinks  . Drug use: No  . Sexual activity: Never  Lifestyle  . Physical activity    Days per week: Not on file    Minutes per session: Not on file  . Stress: Not on file  Relationships  . Social Herbalist on phone: Not on file    Gets together: Not on file    Attends religious service: Not on file    Active member of club or organization: Not on file    Attends meetings of clubs or organizations: Not on file    Relationship status: Not on file  Other Topics Concern  . Not on file  Social History Narrative   Patient is right handed.   Patient drinks 2-3 cups of caffeine per day.   Lives alone in a 2 story home.  Has 4 living children.  1 passed away  at age 69.     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 Sulfa Antibiotics Other (See Comments)    Childhood allergy    Family History  Problem Relation Age of Onset  . Rheum arthritis Mother   . Breast cancer Mother        77's  . Cancer Father        throat  . Breast cancer Sister        79's  . Hypertension Brother   . Hyperlipidemia Brother   . Heart attack Son        Deceased, 30  . Healthy Daughter   . Parkinsonism Neg Hx        Current Outpatient Medications (Analgesics):  .  diclofenac (VOLTAREN) 75 MG EC tablet, Take 1 tablet (75 mg total) by mouth 2 (two) times daily. Marland Kitchen  ibuprofen (ADVIL,MOTRIN) 200 MG tablet, Take 400 mg by mouth every 6 (six) hours as needed for  moderate pain.   Current Outpatient Medications (Hematological):  .  vitamin B-12 (CYANOCOBALAMIN) 1000 MCG tablet, Take by mouth.  Current Outpatient Medications (Other):  Marland Kitchen  Ascorbic Acid (VITAMIN C PO), Take 1 tablet by mouth daily. Marland Kitchen  b complex vitamins capsule, Take 1 capsule by mouth daily. .  calcium-vitamin D (OSCAL WITH D) 250-125 MG-UNIT per tablet, Take 1 tablet by mouth daily. .  carbidopa-levodopa (SINEMET IR) 25-250 MG tablet, Take 1 tablet by mouth 3 (three) times daily. .  Cholecalciferol (VITAMIN D) 2000 UNITS CAPS, Take 2,000 Units by mouth daily. .  diazepam (VALIUM) 2 MG tablet, Take 1 tablet (2 mg total) by mouth every 12 (twelve) hours as needed for anxiety. .  Diclofenac Sodium (PENNSAID) 2 % SOLN, Place 1-2 Pump onto the skin 4 (four) times daily as needed. Marland Kitchen  glucosamine-chondroitin 500-400 MG tablet, Take 1 tablet by mouth daily. .  Magnesium Hydroxide (MAGNESIA PO), Take 1 tablet by mouth daily. .  Multiple Vitamin (MULTIVITAMIN) capsule, Take 1 capsule by mouth daily. .  Omega-3 Fatty Acids (OMEGA 3 PO), Take 1 tablet by mouth daily. Marland Kitchen  tetrahydrozoline-zinc (VISINE-AC) 0.05-0.25 % ophthalmic solution, Place 2 drops into both eyes 4 (four) times daily as needed (allergies). .  TYROSINE PO, Take 1 tablet by mouth daily. Marland Kitchen  gabapentin (NEURONTIN) 100 MG capsule, Take 2 capsules (200 mg total) by mouth at bedtime.    Past medical history, social, surgical and family history all reviewed in electronic medical record.  No pertanent information unless stated regarding to the chief complaint.   Review of Systems:  No headache, visual changes, nausea, vomiting, diarrhea, constipation, dizziness, abdominal pain, skin rash, fevers, chills, night sweats, weight loss, swollen lymph nodes, body aches, joint swelling,  chest pain, shortness of breath, mood changes.  Positive muscle aches  Objective  Blood pressure 118/78, pulse 81, height 5\' 7"  (1.702 m), weight 133 lb  (60.3 kg), SpO2 99 %.    General: No apparent distress alert and oriented x3 mood and affect normal, dressed apropriately.  Patient is cachectic and does have a masked faces HEENT: Pupils equal, extraocular movements intact  Respiratory: Patient's speak in full sentences and does not appear short of breath  Cardiovascular: No lower extremity edema, non tender, no erythema  Skin: Warm dry intact with no signs of infection or rash on extremities or on axial skeleton.  Abdomen: Soft nontender  Neuro: Cranial nerves  II through XII are intact, neurovascularly intact in all extremities with 2+ and brisk DTRs and 2+ pulses.  Lymph: No lymphadenopathy of posterior or anterior cervical chain or axillae bilaterally.  Gait antalgic shuffling gait MSK: Patient does have hypertonicity with some mild cogwheeling and a very small resting tremor of the left upper extremity Neck: Inspection loss of lordosis. No palpable stepoffs. Negative Spurling's maneuver.  Does elicit pain now in the trapezius muscles bilaterally Limited range of motion lacking last 10 degrees of rotation to the right and 5 degrees to the left.  Patient does have minimal sidebending bilaterally.  Near full flexion and extension though noted. Grip strength and sensation normal in bilateral hands Strength good C4 to T1 distribution No sensory change to C4 to T1 Negative Hoffman sign bilaterally Reflexes normal Severe tightness noted of the trapezius muscles and shoulder girdle muscles bilaterally.  [id number]; 15 additional minutes spent for Therapeutic exercises as stated in above notes.  This included exercises focusing on stretching, strengthening, with significant focus on eccentric aspects.   Long term goals include an improvement in range of motion, strength, endurance as well as avoiding reinjury. Patient's frequency would include in 1-2 times a day, 3-5 times a week for a duration of 6-12 weeks. Exercises that included:  Basic scapular  stabilization to include adduction and depression of scapula Scaption, focusing on proper movement and good control Internal and External rotation utilizing a theraband, with elbow tucked at side entire time Rows with theraband which was given   Proper technique shown and discussed handout in great detail with ATC.  All questions were discussed and answered.     Impression and Recommendations:     This case required medical decision making of moderate complexity. The above documentation has been reviewed and is accurate and complete Lyndal Pulley, DO       Note: This dictation was prepared with Dragon dictation along with smaller phrase technology. Any transcriptional errors that result from this process are unintentional.

## 2019-06-18 ENCOUNTER — Encounter: Payer: Self-pay | Admitting: Family Medicine

## 2019-06-18 DIAGNOSIS — M5030 Other cervical disc degeneration, unspecified cervical region: Secondary | ICD-10-CM | POA: Insufficient documentation

## 2019-06-18 NOTE — Assessment & Plan Note (Signed)
Patient does have degenerative disc disease of the cervical spine.  Discussed with patient in great length about this.  Patient does have also what appears to be a small nonunion fracture of the C2 vertebrae.  We discussed the possibility of referral to neurosurgery which patient declined at this time.  Wanted to try conservative therapy first.  Patient wants to avoid formal physical therapy secondary to the coronavirus as well as patient being at increased risk secondary to Parkinson's disease.  Patient given home exercise and work with Product/process development scientist, started on gabapentin and discussed over-the-counter natural supplements that could be beneficial.  Given trial of a topical anti-inflammatory and we discussed icing regimen.  Patient will try this and come back again in 4 to 6 weeks to see how patient is responding.  Worsening symptoms we will consider formal physical therapy and/or epidural injections.  Patient is in agreement with the plan

## 2019-06-24 ENCOUNTER — Encounter: Payer: Medicare PPO | Admitting: Family Medicine

## 2019-07-03 ENCOUNTER — Other Ambulatory Visit: Payer: Self-pay | Admitting: Family Medicine

## 2019-07-15 ENCOUNTER — Ambulatory Visit (INDEPENDENT_AMBULATORY_CARE_PROVIDER_SITE_OTHER): Payer: Medicare PPO | Admitting: Family Medicine

## 2019-07-15 ENCOUNTER — Encounter: Payer: Self-pay | Admitting: Family Medicine

## 2019-07-15 DIAGNOSIS — M5030 Other cervical disc degeneration, unspecified cervical region: Secondary | ICD-10-CM

## 2019-07-15 NOTE — Progress Notes (Signed)
Virtual Visit via Video Note  I connected with [name] on 07/15/19 at  4:15 PM EDT by a video enabled telemedicine application and verified that I am speaking with the correct person using two identifiers.  Location: Patient: In home setting Provider: In office setting   I discussed the limitations of evaluation and management by telemedicine and the availability of in person appointments. The patient expressed understanding and agreed to proceed.  History of Present Illness: 68 year old female who is following up for cervical neck pain.  Patient states that she is feeling 50 to 60% better.  Feels the exercises medications and vitamins have been very helpful.  Denies any radiation down the arm, any numbness or tingling.    Observations/Objective: Alert and oriented x3, patient has good questions during that time.  Seems to have some mild improvement in range of motion of the neck.   Assessment and Plan: Degenerative cervical arthritis of the neck but has done well with conservative therapy.  Feels like she would like felt some more guidance.  Sent to formal physical therapy.  Patient will try this for 4 to 6 weeks and then follow-up with me either virtually are in office again in the near future.   Follow Up Instructions: 4 weeks    I discussed the assessment and treatment plan with the patient. The patient was provided an opportunity to ask questions and all were answered. The patient agreed with the plan and demonstrated an understanding of the instructions.   The patient was advised to call back or seek an in-person evaluation if the symptoms worsen or if the condition fails to improve as anticipated.  I provided 17 minutes of face-to-face time during this encounter.   Lyndal Pulley, DO

## 2019-07-27 ENCOUNTER — Encounter: Payer: Medicare PPO | Admitting: Family Medicine

## 2019-08-05 ENCOUNTER — Other Ambulatory Visit: Payer: Self-pay | Admitting: Family Medicine

## 2019-08-05 ENCOUNTER — Ambulatory Visit (INDEPENDENT_AMBULATORY_CARE_PROVIDER_SITE_OTHER): Payer: Medicare PPO | Admitting: Family Medicine

## 2019-08-05 ENCOUNTER — Encounter: Payer: Self-pay | Admitting: Family Medicine

## 2019-08-05 ENCOUNTER — Other Ambulatory Visit: Payer: Self-pay

## 2019-08-05 VITALS — BP 116/64 | HR 69 | Temp 97.3°F | Ht 67.0 in | Wt 132.0 lb

## 2019-08-05 DIAGNOSIS — J302 Other seasonal allergic rhinitis: Secondary | ICD-10-CM

## 2019-08-05 DIAGNOSIS — Z23 Encounter for immunization: Secondary | ICD-10-CM | POA: Diagnosis not present

## 2019-08-05 DIAGNOSIS — Z1211 Encounter for screening for malignant neoplasm of colon: Secondary | ICD-10-CM

## 2019-08-05 DIAGNOSIS — E559 Vitamin D deficiency, unspecified: Secondary | ICD-10-CM | POA: Diagnosis not present

## 2019-08-05 DIAGNOSIS — Z0001 Encounter for general adult medical examination with abnormal findings: Secondary | ICD-10-CM

## 2019-08-05 DIAGNOSIS — E538 Deficiency of other specified B group vitamins: Secondary | ICD-10-CM

## 2019-08-05 DIAGNOSIS — G20 Parkinson's disease: Secondary | ICD-10-CM

## 2019-08-05 DIAGNOSIS — F419 Anxiety disorder, unspecified: Secondary | ICD-10-CM

## 2019-08-05 DIAGNOSIS — Z13220 Encounter for screening for lipoid disorders: Secondary | ICD-10-CM

## 2019-08-05 DIAGNOSIS — M542 Cervicalgia: Secondary | ICD-10-CM

## 2019-08-05 DIAGNOSIS — K5900 Constipation, unspecified: Secondary | ICD-10-CM

## 2019-08-05 MED ORDER — CARBIDOPA-LEVODOPA 25-250 MG PO TABS: 0.5000 | ORAL_TABLET | Freq: Two times a day (BID) | ORAL | 0 refills | Status: DC

## 2019-08-05 MED ORDER — KETOROLAC TROMETHAMINE 10 MG PO TABS: 10.0000 mg | ORAL_TABLET | Freq: Four times a day (QID) | ORAL | 0 refills | Status: DC | PRN

## 2019-08-05 MED ORDER — AMANTADINE HCL 100 MG PO CAPS: 100.0000 mg | ORAL_CAPSULE | Freq: Two times a day (BID) | ORAL | 3 refills | Status: DC

## 2019-08-05 MED ORDER — KETOROLAC TROMETHAMINE 60 MG/2ML IM SOLN
60.0000 mg | Freq: Once | INTRAMUSCULAR | Status: AC
  Administered 2019-08-05: 60 mg via INTRAMUSCULAR

## 2019-08-05 NOTE — Assessment & Plan Note (Addendum)
Will place referral to see a new neurologist.  We will decrease dose of Sinemet to twice daily given her side effects to treatment.  Will send in low-dose amantadine 100mg  twice daily. Discussed potential side effects.

## 2019-08-05 NOTE — Progress Notes (Signed)
Chief Complaint:  [name] [name] is a 68 y.o. female who presents today for her annual comprehensive physical exam.    Assessment/Plan:  Parkinson disease Will place referral to see a new neurologist.  We will decrease dose of Sinemet to twice daily given her side effects to treatment.  Will send in low-dose amantadine '100mg'$  twice daily. Discussed potential side effects.   Anxiety Stable. Continue low does valium as needed.   Constipation Slightly worsened. Recommended that she increase miralax as needed to have 1-2 soft bowel movements daily.   Seasonal allergies Stable. Continue OTC meds as needed.   Neck Pain No red flags.  Has follow-up with sports medicine.  Will give 60 mg of Toradol today.  Also send in short supply oral Toradol.  Discussed reasons return to care.  Check CBC, C met, TSH, and ESR.   Preventative Healthcare: Flu vaccine given today.  Will order Cologuard.  Would like to hold off on pneumonia vaccine today.  Check CBC, C met, TSH, vitamin D, B12, and lipid panel.  Patient Counseling(The following topics were reviewed and/or handout was given):  -Nutrition: Stressed importance of moderation in sodium/caffeine intake, saturated fat and cholesterol, caloric balance, sufficient intake of fresh fruits, vegetables, and fiber.  -Stressed the importance of regular exercise.   -Substance Abuse: Discussed cessation/primary prevention of tobacco, alcohol, or other drug use; driving or other dangerous activities under the influence; availability of treatment for abuse.   -Injury prevention: Discussed safety belts, safety helmets, smoke detector, smoking near bedding or upholstery.   -Sexuality: Discussed sexually transmitted diseases, partner selection, use of condoms, avoidance of unintended pregnancy and contraceptive alternatives.   -Dental health: Discussed importance of regular tooth brushing, flossing, and dental visits.  -Health maintenance and immunizations  reviewed. Please refer to Health maintenance section.  Return to care in 1 year for next preventative visit.     Subjective:  HPI:  Patient has had a recent flareup of her chronic neck pain.  She has been taking ibuprofen '800mg'$  daily with modest improvement.  Pain is worse with motions.  She had similar episode while she was in [place] earlier this year.  She took a medication called mavidol which helped with her pain.  No weakness or numbness.  She is also had some flareup of her Parkinson disease.  She has had increased rigidity in her neck.  She has been taking high-dose Sinemet however has had to wean off some due to worsening nausea.  She is now taking half of a tablet of the   Her stable, chronic medical conditions are outlined below:   #Constipation - On MiraLAX daily as needed  % Parkinson disease -Has followed with neurology in the past.  Not seen him for several months.  Would like to be referred to a new neurologist. - On Sinemet 25-250 3 times daily.  # Anxiety -Uses Valium as needed sparingly - No reported SI or HI  #Seasonal allergies - Uses over-the-counter medications as needed seasonally.  Lifestyle Diet: Balanced. Tries to eat plenty of fruits and vegetables.  Exercise: Limited due to Parkinson disease and chronic neck pain.  Depression screen [facility] 2/9 09/17/2018  Decreased Interest 0  Down, Depressed, Hopeless 0  PHQ - 2 Score 0  Altered sleeping 0  Tired, decreased energy 2  Change in appetite 0  Feeling bad or failure about yourself  0  Trouble concentrating 0  Moving slowly or fidgety/restless 3  Suicidal thoughts 0  PHQ-9 Score 5  Difficult doing work/chores Somewhat difficult   Health Maintenance Due  Topic Date Due  . TETANUS/TDAP  02/16/1969  . PNA vac Low Risk Adult (1 of 2 - PCV13) 02/17/2015  . COLONOSCOPY  01/10/2016    ROS: Per HPI, otherwise a complete review of systems was negative.   PMH:  The following were reviewed and  entered/updated in epic: Past Medical History:  Diagnosis Date  . Fibroid   . GERD (gastroesophageal reflux disease)   . Muscle weakness of lower extremity 01/17/2015  . Osteopenia   . Parkinson disease ([place]) 01/17/2015   Patient Active Problem List   Diagnosis Date Noted  . DDD (degenerative disc disease), cervical 06/18/2019  . Constipation 03/02/2019  . Seasonal allergies 03/02/2019  . Anxiety 05/07/2018  . Fatigue 04/20/2018  . Osteoarthritis 04/20/2018  . Palpitations 04/20/2018  . Cardiomegaly 04/20/2018  . Lesion of liver 04/20/2018  . Lung nodule 04/20/2018  . Muscle weakness of lower extremity 01/17/2015  . Parkinson disease ([place]) 01/17/2015  . Fibroid uterus 11/13/2012  . Benign colonic polyp 10/02/2012   Past Surgical History:  Procedure Laterality Date  . FINGER SURGERY    . MOUTH SURGERY    . TOE SURGERY    . TONSILLECTOMY    . TUBAL LIGATION     x2    Family History  Problem Relation Age of Onset  . Rheum arthritis Mother   . Breast cancer Mother        66's  . Cancer Father        throat  . Breast cancer Sister        65's  . Hypertension Brother   . Hyperlipidemia Brother   . Heart attack Son        Deceased, 50  . Healthy Daughter   . Parkinsonism Neg Hx     Medications- reviewed and updated Current Outpatient Medications  Medication Sig Dispense Refill  . Ascorbic Acid (VITAMIN C PO) Take 1 tablet by mouth daily.    Marland Kitchen b complex vitamins capsule Take 1 capsule by mouth daily.    . carbidopa-levodopa (SINEMET IR) 25-250 MG tablet Take 0.5 tablets by mouth 2 (two) times daily. 90 tablet 0  . Cholecalciferol (VITAMIN D) 2000 UNITS CAPS Take 2,000 Units by mouth daily.    . diazepam (VALIUM) 2 MG tablet Take 1 tablet (2 mg total) by mouth every 12 (twelve) hours as needed for anxiety. 30 tablet 0  . Diclofenac Sodium (PENNSAID) 2 % SOLN Place 1-2 Pump onto the skin 4 (four) times daily as needed. 112 g 1  . glucosamine-chondroitin 500-400 MG  tablet Take 1 tablet by mouth daily.    Marland Kitchen ibuprofen (ADVIL,MOTRIN) 200 MG tablet Take 400 mg by mouth every 6 (six) hours as needed for moderate pain.     . Magnesium Hydroxide (MAGNESIA PO) Take 1 tablet by mouth daily.    . Multiple Vitamin (MULTIVITAMIN) capsule Take 1 capsule by mouth daily.    . Omega-3 Fatty Acids (OMEGA 3 PO) Take 1 tablet by mouth daily.    Marland Kitchen tetrahydrozoline-zinc (VISINE-AC) 0.05-0.25 % ophthalmic solution Place 2 drops into both eyes 4 (four) times daily as needed (allergies).    . vitamin B-12 (CYANOCOBALAMIN) 1000 MCG tablet Take by mouth.    Marland Kitchen amantadine (SYMMETREL) 100 MG capsule Take 1 capsule (100 mg total) by mouth 2 (two) times daily. 60 capsule 3  . gabapentin (NEURONTIN) 100 MG capsule Take 2 capsules (200 mg total) by mouth at  bedtime. (Patient not taking: Reported on 08/05/2019) 180 capsule 0  . ketorolac (TORADOL) 10 MG tablet Take 1 tablet (10 mg total) by mouth every 6 (six) hours as needed. 20 tablet 0   No current facility-administered medications for this visit.     Allergies-reviewed and updated Allergies  Allergen Reactions  . Sulfa Antibiotics Other (See Comments)    Childhood allergy     Social History   Socioeconomic History  . Marital status: Divorced    Spouse name: Not on file  . Number of children: 4  . Years of education: B.A.  . Highest education level: Not on file  Occupational History  . Occupation: SECRETARY  Social Needs  . Financial resource strain: Not on file  . Food insecurity    Worry: Not on file    Inability: Not on file  . Transportation needs    Medical: Not on file    Non-medical: Not on file  Tobacco Use  . Smoking status: Never Smoker  . Smokeless tobacco: Never Used  Substance and Sexual Activity  . Alcohol use: No    Alcohol/week: 0.0 standard drinks  . Drug use: No  . Sexual activity: Never  Lifestyle  . Physical activity    Days per week: Not on file    Minutes per session: Not on file  .  Stress: Not on file  Relationships  . Social Herbalist on phone: Not on file    Gets together: Not on file    Attends religious service: Not on file    Active member of club or organization: Not on file    Attends meetings of clubs or organizations: Not on file    Relationship status: Not on file  Other Topics Concern  . Not on file  Social History Narrative   Patient is right handed.   Patient drinks 2-3 cups of caffeine per day.   Lives alone in a 2 story home.  Has 4 living children.  1 passed away at age 45.     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 Wt Readings from Last 3 Encounters:  08/05/19 132 lb (59.9 kg)  06/17/19 133 lb (60.3 kg)  09/17/18 124 lb 12.8 oz (56.6 kg)   Gen: NAD, resting comfortably HEENT: TMs normal bilaterally. OP clear. No thyromegaly noted.  CV: RRR with no murmurs appreciated Pulm: NWOB, CTAB with no crackles, wheezes, or rhonchi GI: Normal bowel sounds present. Soft, Nontender, Nondistended. MSK: no edema, cyanosis, or clubbing noted -Neck: Tender palpation along paraspinal muscles.  Full range of motion throughout though limited due to pain - Upper extremities: Full range of motion.  No rigidity.  Neurovascular intact distally - Lower extremities cough orange motion.  Neurovascular intact distally. Skin: warm, dry Neuro: CN2-12 grossly intact. Strength 5/5 in upper and lower extremities. Reflexes symmetric and intact bilaterally.  Psych: Normal affect and thought content     Ceili Boshers M. Jerline Pain, MD 08/05/2019 3:18 PM

## 2019-08-05 NOTE — Assessment & Plan Note (Signed)
Stable.  Continue OTC meds as needed. 

## 2019-08-05 NOTE — Patient Instructions (Addendum)
It was very nice to see you today!  We will give an injection of an anti-inflammatory called Toradol.  I will send in oral version of this to your pharmacy.  Please start amantadine.  Please reduce your Sinemet to twice daily.  We will give you a flu vaccine today.  We will set you up to have a Cologuard done that we will screen for colon cancer.  We will check blood work today.  Come back to see me in a year for your next physical, or sooner if needed.  Take care, Dr   Please try these tips to maintain a healthy lifestyle:   Eat at least 3 REAL meals and 1-2 snacks per day.  Aim for no more than 5 hours between eating.  If you eat breakfast, please do so within one hour of getting up.    Obtain twice as many fruits/vegetables as protein or carbohydrate foods for both lunch and dinner. (Half of each meal should be fruits/vegetables, one quarter protein, and one quarter starchy carbs)   Cut down on sweet beverages. This includes juice, soda, and sweet tea.    Exercise at least 150 minutes every week.    Preventive Care 65 Years and Older, Female Preventive care refers to lifestyle choices and visits with your health care provider that can promote health and wellness. This includes:  A yearly physical exam. This is also called an annual well check.  Regular dental and eye exams.  Immunizations.  Screening for certain conditions.  Healthy lifestyle choices, such as diet and exercise. What can I expect for my preventive care visit? Physical exam Your health care provider will check:  Height and weight. These may be used to calculate body mass index (BMI), which is a measurement that tells if you are at a healthy weight.  Heart rate and blood pressure.  Your skin for abnormal spots. Counseling Your health care provider may ask you questions about:  Alcohol, tobacco, and drug use.  Emotional well-being.  Home and relationship well-being.  Sexual activity.   Eating habits.  History of falls.  Memory and ability to understand (cognition).  Work and work environment.  Pregnancy and menstrual history. What immunizations do I need?  Influenza (flu) vaccine  This is recommended every year. Tetanus, diphtheria, and pertussis (Tdap) vaccine  You may need a Td booster every 10 years. Varicella (chickenpox) vaccine  You may need this vaccine if you have not already been vaccinated. Zoster (shingles) vaccine  You may need this after age 60. Pneumococcal conjugate (PCV13) vaccine  One dose is recommended after age 65. Pneumococcal polysaccharide (PPSV23) vaccine  One dose is recommended after age 65. Measles, mumps, and rubella (MMR) vaccine  You may need at least one dose of MMR if you were born in 1957 or later. You may also need a second dose. Meningococcal conjugate (MenACWY) vaccine  You may need this if you have certain conditions. Hepatitis A vaccine  You may need this if you have certain conditions or if you travel or work in places where you may be exposed to hepatitis A. Hepatitis B vaccine  You may need this if you have certain conditions or if you travel or work in places where you may be exposed to hepatitis B. Haemophilus influenzae type b (Hib) vaccine  You may need this if you have certain conditions. You may receive vaccines as individual doses or as more than one vaccine together in one shot (combination vaccines). Talk with your   health care provider about the risks and benefits of combination vaccines. What tests do I need? Blood tests  Lipid and cholesterol levels. These may be checked every 5 years, or more frequently depending on your overall health.  Hepatitis C test.  Hepatitis B test. Screening  Lung cancer screening. You may have this screening every year starting at age 45 if you have a 30-pack-year history of smoking and currently smoke or have quit within the past 15 years.  Colorectal cancer  screening. All adults should have this screening starting at age 60 and continuing until age 67. Your health care provider may recommend screening at age 30 if you are at increased risk. You will have tests every 1-10 years, depending on your results and the type of screening test.  Diabetes screening. This is done by checking your blood sugar (glucose) after you have not eaten for a while (fasting). You may have this done every 1-3 years.  Mammogram. This may be done every 1-2 years. Talk with your health care provider about how often you should have regular mammograms.  BRCA-related cancer screening. This may be done if you have a family history of breast, ovarian, tubal, or peritoneal cancers. Other tests  Sexually transmitted disease (STD) testing.  Bone density scan. This is done to screen for osteoporosis. You may have this done starting at age 38. Follow these instructions at home: Eating and drinking  Eat a diet that includes fresh fruits and vegetables, whole grains, lean protein, and low-fat dairy products. Limit your intake of foods with high amounts of sugar, saturated fats, and salt.  Take vitamin and mineral supplements as recommended by your health care provider.  Do not drink alcohol if your health care provider tells you not to drink.  If you drink alcohol: ? Limit how much you have to 0-1 drink a day. ? Be aware of how much alcohol is in your drink. In the [place] one drink equals one 12 oz bottle of beer (355 mL), one 5 oz glass of wine (148 mL), or one 1 oz glass of hard liquor (44 mL). Lifestyle  Take daily care of your teeth and gums.  Stay active. Exercise for at least 30 minutes on 5 or more days each week.  Do not use any products that contain nicotine or tobacco, such as cigarettes, e-cigarettes, and chewing tobacco. If you need help quitting, ask your health care provider.  If you are sexually active, practice safe sex. Use a condom or other form of  protection in order to prevent STIs (sexually transmitted infections).  Talk with your health care provider about taking a low-dose aspirin or statin. What's next?  Go to your health care provider once a year for a well check visit.  Ask your health care provider how often you should have your eyes and teeth checked.  Stay up to date on all vaccines. This information is not intended to replace advice given to you by your health care provider. Make sure you discuss any questions you have with your health care provider. Document Released: 11/10/2015 Document Revised: 10/08/2018 Document Reviewed: 10/08/2018 Elsevier Patient Education  2020 [employer].

## 2019-08-05 NOTE — Assessment & Plan Note (Signed)
Stable. Continue low does valium as needed.

## 2019-08-05 NOTE — Assessment & Plan Note (Addendum)
Slightly worsened. Recommended that she increase miralax as needed to have 1-2 soft bowel movements daily.

## 2019-08-06 ENCOUNTER — Other Ambulatory Visit: Payer: Self-pay | Admitting: Family Medicine

## 2019-08-06 LAB — CBC
HCT: 40.2 % (ref 36.0–46.0)
Hemoglobin: 13.4 g/dL (ref 12.0–15.0)
MCHC: 33.3 g/dL (ref 30.0–36.0)
MCV: 90.2 fl (ref 78.0–100.0)
Platelets: 149 10*3/uL — ABNORMAL LOW (ref 150.0–400.0)
RBC: 4.46 Mil/uL (ref 3.87–5.11)
RDW: 13.2 % (ref 11.5–15.5)
WBC: 7.3 10*3/uL (ref 4.0–10.5)

## 2019-08-06 LAB — VITAMIN B12: Vitamin B-12: 1342 pg/mL — ABNORMAL HIGH (ref 211–911)

## 2019-08-06 LAB — COMPREHENSIVE METABOLIC PANEL
ALT: 5 U/L (ref 0–35)
AST: 16 U/L (ref 0–37)
Albumin: 4.5 g/dL (ref 3.5–5.2)
Alkaline Phosphatase: 56 U/L (ref 39–117)
BUN: 14 mg/dL (ref 6–23)
CO2: 26 mEq/L (ref 19–32)
Calcium: 10.1 mg/dL (ref 8.4–10.5)
Chloride: 104 mEq/L (ref 96–112)
Creatinine, Ser: 0.68 mg/dL (ref 0.40–1.20)
GFR: 103.67 mL/min (ref >60.00)
Glucose, Bld: 80 mg/dL (ref 70–99)
Potassium: 4.1 mEq/L (ref 3.5–5.1)
Sodium: 139 mEq/L (ref 135–145)
Total Bilirubin: 0.6 mg/dL (ref 0.2–1.2)
Total Protein: 7.4 g/dL (ref 6.0–8.3)

## 2019-08-06 LAB — LIPID PANEL
Cholesterol: 159 mg/dL (ref 0–200)
HDL: 74.1 mg/dL (ref >39.00)
LDL Cholesterol: 76 mg/dL (ref 0–99)
NonHDL: 84.77
Total CHOL/HDL Ratio: 2
Triglycerides: 45 mg/dL (ref 0.0–149.0)
VLDL: 9 mg/dL (ref 0.0–40.0)

## 2019-08-06 LAB — SEDIMENTATION RATE: Sed Rate: 5 mm/hr (ref 0–30)

## 2019-08-06 LAB — VITAMIN D 25 HYDROXY (VIT D DEFICIENCY, FRACTURES): VITD: 37.66 ng/mL (ref 30.00–100.00)

## 2019-08-06 LAB — TSH: TSH: 0.51 u[IU]/mL (ref 0.35–4.50)

## 2019-08-09 NOTE — Progress Notes (Signed)
Please inform patient of the following:  Her blood work is all NORMAL. Would like for her to keep up the good work and we can recheck in a year.  Algis Greenhouse. Jerline Pain, MD 08/09/2019 1:01 PM

## 2019-08-11 ENCOUNTER — Ambulatory Visit: Payer: Self-pay | Admitting: Family Medicine

## 2019-08-11 NOTE — Telephone Encounter (Signed)
Attempted to reach pt, left message to CB, after hours call. Please advise:  Summary:    Patient calling and states that she would like to know if there is a such thing as a timed release version of carbidopa-levodopa (SINEMET IR) 25-250 MG tablet. States that when she takes this medication she feels ill. Would like to know if there is anything she should do before taking this medication to help not feel so bad after taking? Please advise.   CB# [phone]

## 2019-08-12 ENCOUNTER — Other Ambulatory Visit: Payer: Self-pay

## 2019-08-12 NOTE — Telephone Encounter (Signed)
See note

## 2019-08-12 NOTE — Telephone Encounter (Signed)
It does come in controlled release formulation - we can try that if she wants to switch though she needs to follow up with neuro soon.  Algis Greenhouse. Jerline Pain, MD 08/12/2019 3:45 PM

## 2019-08-12 NOTE — Telephone Encounter (Signed)
Patient will follow up with neuro.Instead of changing medication she will try taking medication with crackers,she will see if it helps with nausea.

## 2019-08-12 NOTE — Telephone Encounter (Signed)
Left voice message for patient to call clinic. Please advise 

## 2019-08-27 ENCOUNTER — Telehealth: Payer: Self-pay | Admitting: Family Medicine

## 2019-08-27 NOTE — Telephone Encounter (Signed)
See note  Copied from [place] [id number]. Topic: General - Inquiry >> Aug 27, 2019 12:43 PM [name] [name], NT wrote: Reason for CRM: Patient called in stating she would like to be referred to another neurologist and would not like to see Dr.Willis again. Please advise. Call back is [phone]

## 2019-08-27 NOTE — Telephone Encounter (Signed)
Left voice message for patient to call and leave name of Neuro that she prefers.

## 2019-09-01 ENCOUNTER — Ambulatory Visit: Payer: Medicare PPO | Admitting: Family Medicine

## 2019-09-08 ENCOUNTER — Encounter: Payer: Self-pay | Admitting: Family Medicine

## 2019-09-08 ENCOUNTER — Ambulatory Visit (INDEPENDENT_AMBULATORY_CARE_PROVIDER_SITE_OTHER): Payer: Medicare PPO | Admitting: Family Medicine

## 2019-09-08 DIAGNOSIS — M5030 Other cervical disc degeneration, unspecified cervical region: Secondary | ICD-10-CM | POA: Diagnosis not present

## 2019-09-08 NOTE — Progress Notes (Signed)
Virtual Visit via Video Note  I connected with [name] [name] on 09/08/19 at  4:15 PM EST by a video enabled telemedicine application and verified that I am speaking with the correct person using two identifiers.  Location: Patient:  At home  Provider: In office setting   I discussed the limitations of evaluation and management by telemedicine and the availability of in person appointments. The patient expressed understanding and agreed to proceed.  History of Present Illness: Patient is a 68 year old female who was having degenerative neck pain.  Patient since that overall is feeling significantly better.  Did see primary care provider and given a Toradol injection that she feels has been significantly beneficial.  Given some medications but has not had to take them on a regular basis.    Observations/Objective: Alert and oriented.  Mild masked facies noted   Assessment and Plan: 68 year old female with a history of degenerative disc disease of the cervical spine given some mild headaches and seems to be doing better.  Past medical history also significant for Parkinson's.  Likely, contributed with some hypertonicity.    Follow Up Instructions: Follow-up as needed    I discussed the assessment and treatment plan with the patient. The patient was provided an opportunity to ask questions and all were answered. The patient agreed with the plan and demonstrated an understanding of the instructions.   The patient was advised to call back or seek an in-person evaluation if the symptoms worsen or if the condition fails to improve as anticipated.  I provided 9 minutes of face-to-face time during this encounter.   Lyndal Pulley, DO

## 2019-09-21 ENCOUNTER — Other Ambulatory Visit: Payer: Self-pay

## 2019-09-21 ENCOUNTER — Encounter (INDEPENDENT_AMBULATORY_CARE_PROVIDER_SITE_OTHER): Payer: Self-pay

## 2019-09-21 ENCOUNTER — Ambulatory Visit (INDEPENDENT_AMBULATORY_CARE_PROVIDER_SITE_OTHER): Payer: Medicare PPO

## 2019-09-21 DIAGNOSIS — Z0000 Encounter for general adult medical examination without abnormal findings: Secondary | ICD-10-CM

## 2019-09-21 NOTE — Progress Notes (Signed)
I have personally reviewed the Medicare Annual Wellness Visit and agree with the assessment and plan.  Algis Greenhouse. Jerline Pain, MD 09/21/2019 4:34 PM

## 2019-09-21 NOTE — Patient Instructions (Signed)
[name] , Thank you for taking time to come for your Medicare Wellness Visit. I appreciate your ongoing commitment to your health goals. Please review the following plan we discussed and let me know if I can assist you in the future.   Screening recommendations/referrals: Colorectal Screening: please complete Cologuard when able Mammogram: up to date; last 05/25/19 Bone Density: recommended; please let us know if you would like to schedule   Vision and Dental Exams: Recommended annual ophthalmology exams for early detection of glaucoma and other disorders of the eye Recommended annual dental exams for proper oral hygiene  Vaccinations: Influenza vaccine: completed 08/05/19 Pneumococcal vaccine: Pnuemovax 23 recommended  Tdap vaccine: up to date; last 02/10/15 Shingles vaccine: Please call your insurance company to determine your out of pocket expense for the Shingrix vaccine. You may receive this vaccine at your local pharmacy.  Advanced directives: Advance directives discussed with you today. I have provided a copy for you to complete at home and have notarized. Once this is complete please bring a copy in to our office so we can scan it into your chart.  Goals: Recommend to drink at least 6-8 8oz glasses of water per day and consume a balanced diet rich in fresh fruits and vegetables.   Next appointment: Please schedule your Annual Wellness Visit with your Nurse Health Advisor in one year.  Preventive Care 69 Years and Older, Female Preventive care refers to lifestyle choices and visits with your health care provider that can promote health and wellness. What does preventive care include?  A yearly physical exam. This is also called an annual well check.  Dental exams once or twice a year.  Routine eye exams. Ask your health care provider how often you should have your eyes checked.  Personal lifestyle choices, including:  Daily care of your teeth and gums.  Regular physical  activity.  Eating a healthy diet.  Avoiding tobacco and drug use.  Limiting alcohol use.  Practicing safe sex.  Taking low-dose aspirin every day if recommended by your health care provider.  Taking vitamin and mineral supplements as recommended by your health care provider. What happens during an annual well check? The services and screenings done by your health care provider during your annual well check will depend on your age, overall health, lifestyle risk factors, and family history of disease. Counseling  Your health care provider may ask you questions about your:  Alcohol use.  Tobacco use.  Drug use.  Emotional well-being.  Home and relationship well-being.  Sexual activity.  Eating habits.  History of falls.  Memory and ability to understand (cognition).  Work and work Statistician.  Reproductive health. Screening  You may have the following tests or measurements:  Height, weight, and BMI.  Blood pressure.  Lipid and cholesterol levels. These may be checked every 5 years, or more frequently if you are over 20 years old.  Skin check.  Lung cancer screening. You may have this screening every year starting at age 58 if you have a 30-pack-year history of smoking and currently smoke or have quit within the past 15 years.  Fecal occult blood test (FOBT) of the stool. You may have this test every year starting at age 25.  Flexible sigmoidoscopy or colonoscopy. You may have a sigmoidoscopy every 5 years or a colonoscopy every 10 years starting at age 37.  Hepatitis C blood test.  Hepatitis B blood test.  Sexually transmitted disease (STD) testing.  Diabetes screening. This is done  by checking your blood sugar (glucose) after you have not eaten for a while (fasting). You may have this done every 1-3 years.  Bone density scan. This is done to screen for osteoporosis. You may have this done starting at age 66.  Mammogram. This may be done every 1-2  years. Talk to your health care provider about how often you should have regular mammograms. Talk with your health care provider about your test results, treatment options, and if necessary, the need for more tests. Vaccines  Your health care provider may recommend certain vaccines, such as:  Influenza vaccine. This is recommended every year.  Tetanus, diphtheria, and acellular pertussis (Tdap, Td) vaccine. You may need a Td booster every 10 years.  Zoster vaccine. You may need this after age 2.  Pneumococcal 13-valent conjugate (PCV13) vaccine. One dose is recommended after age 58.  Pneumococcal polysaccharide (PPSV23) vaccine. One dose is recommended after age 22. Talk to your health care provider about which screenings and vaccines you need and how often you need them. This information is not intended to replace advice given to you by your health care provider. Make sure you discuss any questions you have with your health care provider. Document Released: 11/10/2015 Document Revised: 07/03/2016 Document Reviewed: 08/15/2015 Elsevier Interactive Patient Education  2017 [place] Prevention in the Home Falls can cause injuries. They can happen to people of all ages. There are many things you can do to make your home safe and to help prevent falls. What can I do on the outside of my home?  Regularly fix the edges of walkways and driveways and fix any cracks.  Remove anything that might make you trip as you walk through a door, such as a raised step or threshold.  Trim any bushes or trees on the path to your home.  Use bright outdoor lighting.  Clear any walking paths of anything that might make someone trip, such as rocks or tools.  Regularly check to see if handrails are loose or broken. Make sure that both sides of any steps have handrails.  Any raised decks and porches should have guardrails on the edges.  Have any leaves, snow, or ice cleared regularly.  Use  sand or salt on walking paths during winter.  Clean up any spills in your garage right away. This includes oil or grease spills. What can I do in the bathroom?  Use night lights.  Install grab bars by the toilet and in the tub and shower. Do not use towel bars as grab bars.  Use non-skid mats or decals in the tub or shower.  If you need to sit down in the shower, use a plastic, non-slip stool.  Keep the floor dry. Clean up any water that spills on the floor as soon as it happens.  Remove soap buildup in the tub or shower regularly.  Attach bath mats securely with double-sided non-slip rug tape.  Do not have throw rugs and other things on the floor that can make you trip. What can I do in the bedroom?  Use night lights.  Make sure that you have a light by your bed that is easy to reach.  Do not use any sheets or blankets that are too big for your bed. They should not hang down onto the floor.  Have a firm chair that has side arms. You can use this for support while you get dressed.  Do not have throw rugs and other things on  the floor that can make you trip. What can I do in the kitchen?  Clean up any spills right away.  Avoid walking on wet floors.  Keep items that you use a lot in easy-to-reach places.  If you need to reach something above you, use a strong step stool that has a grab bar.  Keep electrical cords out of the way.  Do not use floor polish or wax that makes floors slippery. If you must use wax, use non-skid floor wax.  Do not have throw rugs and other things on the floor that can make you trip. What can I do with my stairs?  Do not leave any items on the stairs.  Make sure that there are handrails on both sides of the stairs and use them. Fix handrails that are broken or loose. Make sure that handrails are as long as the stairways.  Check any carpeting to make sure that it is firmly attached to the stairs. Fix any carpet that is loose or worn.  Avoid  having throw rugs at the top or bottom of the stairs. If you do have throw rugs, attach them to the floor with carpet tape.  Make sure that you have a light switch at the top of the stairs and the bottom of the stairs. If you do not have them, ask someone to add them for you. What else can I do to help prevent falls?  Wear shoes that:  Do not have high heels.  Have rubber bottoms.  Are comfortable and fit you well.  Are closed at the toe. Do not wear sandals.  If you use a stepladder:  Make sure that it is fully opened. Do not climb a closed stepladder.  Make sure that both sides of the stepladder are locked into place.  Ask someone to hold it for you, if possible.  Clearly mark and make sure that you can see:  Any grab bars or handrails.  First and last steps.  Where the edge of each step is.  Use tools that help you move around (mobility aids) if they are needed. These include:  Canes.  Walkers.  Scooters.  Crutches.  Turn on the lights when you go into a dark area. Replace any light bulbs as soon as they burn out.  Set up your furniture so you have a clear path. Avoid moving your furniture around.  If any of your floors are uneven, fix them.  If there are any pets around you, be aware of where they are.  Review your medicines with your doctor. Some medicines can make you feel dizzy. This can increase your chance of falling. Ask your doctor what other things that you can do to help prevent falls. This information is not intended to replace advice given to you by your health care provider. Make sure you discuss any questions you have with your health care provider. Document Released: 08/10/2009 Document Revised: 03/21/2016 Document Reviewed: 11/18/2014 Elsevier Interactive Patient Education  2017 [employer].

## 2019-09-21 NOTE — Progress Notes (Signed)
This visit is being conducted via phone call due to the COVID-19 pandemic. This patient has given me verbal consent via phone to conduct this visit, patient states they are participating from their home address. Some vital signs may be absent or patient reported.   Patient identification: identified by name, DOB, and current address.  Location provider: [facility], Office Persons participating in the virtual visit: Denman George LPN and Dr. Dimas Chyle     Subjective:   [name] [name] is a 68 y.o. female who presents for an Initial Medicare Annual Wellness Visit.  Review of Systems     Cardiac Risk Factors include: advanced age (>35men, >16 women);sedentary lifestyle    Objective:    There were no vitals filed for this visit. There is no height or weight on file to calculate BMI.  Advanced Directives 09/21/2019 06/03/2018 05/07/2018 04/17/2018 02/13/2017 02/03/2017 01/23/2017  Does Patient Have a Medical Advance Directive? No No No No No No No  Would patient like information on creating a medical advance directive? Yes (MAU/Ambulatory/Procedural Areas - Information given) - No - Patient declined No - Patient declined - Yes (MAU/Ambulatory/Procedural Areas - Information given) Yes (MAU/Ambulatory/Procedural Areas - Information given)    Current Medications (verified) Outpatient Encounter Medications as of 09/21/2019  Medication Sig  . Ascorbic Acid (VITAMIN C PO) Take 1 tablet by mouth daily.  Marland Kitchen b complex vitamins capsule Take 1 capsule by mouth daily.  . carbidopa-levodopa (SINEMET IR) 25-250 MG tablet Take 0.5 tablets by mouth 2 (two) times daily.  . Cholecalciferol (VITAMIN D) 2000 UNITS CAPS Take 2,000 Units by mouth daily.  . diazepam (VALIUM) 2 MG tablet Take 1 tablet (2 mg total) by mouth every 12 (twelve) hours as needed for anxiety.  . Diclofenac Sodium (PENNSAID) 2 % SOLN Place 1-2 Pump onto the skin 4 (four) times daily as needed.  . gabapentin (NEURONTIN) 100 MG capsule  Take 2 capsules (200 mg total) by mouth at bedtime.  Marland Kitchen glucosamine-chondroitin 500-400 MG tablet Take 1 tablet by mouth daily.  Marland Kitchen ibuprofen (ADVIL,MOTRIN) 200 MG tablet Take 400 mg by mouth every 6 (six) hours as needed for moderate pain.   Marland Kitchen ketorolac (TORADOL) 10 MG tablet Take 1 tablet (10 mg total) by mouth every 6 (six) hours as needed.  . Magnesium Hydroxide (MAGNESIA PO) Take 1 tablet by mouth daily.  . Multiple Vitamin (MULTIVITAMIN) capsule Take 1 capsule by mouth daily.  . Omega-3 Fatty Acids (OMEGA 3 PO) Take 1 tablet by mouth daily.  Marland Kitchen tetrahydrozoline-zinc (VISINE-AC) 0.05-0.25 % ophthalmic solution Place 2 drops into both eyes 4 (four) times daily as needed (allergies).  . vitamin B-12 (CYANOCOBALAMIN) 1000 MCG tablet Take by mouth.  Marland Kitchen amantadine (SYMMETREL) 100 MG capsule Take 1 capsule (100 mg total) by mouth 2 (two) times daily. (Patient not taking: Reported on 09/21/2019)   No facility-administered encounter medications on file as of 09/21/2019.     Allergies (verified) Sulfa antibiotics   History: Past Medical History:  Diagnosis Date  . Fibroid   . GERD (gastroesophageal reflux disease)   . Muscle weakness of lower extremity 01/17/2015  . Osteopenia   . Parkinson disease ([place]) 01/17/2015   Past Surgical History:  Procedure Laterality Date  . FINGER SURGERY    . MOUTH SURGERY    . TOE SURGERY    . TONSILLECTOMY    . TUBAL LIGATION     x2   Family History  Problem Relation Age of Onset  . Rheum  arthritis Mother   . Breast cancer Mother        1's  . Cancer Father        throat  . Breast cancer Sister        54's  . Hypertension Brother   . Hyperlipidemia Brother   . Heart attack Son        Deceased, 49  . Healthy Daughter   . Parkinsonism Neg Hx    Social History   Socioeconomic History  . Marital status: Divorced    Spouse name: Not on file  . Number of children: 4  . Years of education: B.A.  . Highest education level: Not on file   Occupational History  . Occupation: SECRETARY  Social Needs  . Financial resource strain: Not on file  . Food insecurity    Worry: Not on file    Inability: Not on file  . Transportation needs    Medical: Not on file    Non-medical: Not on file  Tobacco Use  . Smoking status: Never Smoker  . Smokeless tobacco: Never Used  Substance and Sexual Activity  . Alcohol use: No    Alcohol/week: 0.0 standard drinks  . Drug use: No  . Sexual activity: Never  Lifestyle  . Physical activity    Days per week: Not on file    Minutes per session: Not on file  . Stress: Not on file  Relationships  . Social Herbalist on phone: Not on file    Gets together: Not on file    Attends religious service: Not on file    Active member of club or organization: Not on file    Attends meetings of clubs or organizations: Not on file    Relationship status: Not on file  Other Topics Concern  . Not on file  Social History Narrative   Patient is right handed.   Patient drinks 2-3 cups of caffeine per day.   Lives alone in a 2 story home.  Has 4 living children.  1 passed away at age 73.     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 N  Vision? N  Difficulty concentrating or making decisions? N  Walking or climbing stairs? Y  Dressing or bathing? N  Doing errands, shopping? N  Preparing Food  and eating ? N  Using the Toilet? N  In the past six months, have you accidently leaked urine? N  Do you have problems with loss of bowel control? N  Managing your Medications? N  Managing your Finances? N  Housekeeping or managing your Housekeeping? N  Some recent data might be hidden     Immunizations and Health Maintenance Immunization History  Administered Date(s) Administered  . Fluad Quad(high Dose 65+) 08/05/2019   Health Maintenance Due  Topic Date Due  . TETANUS/TDAP  02/16/1969  . PNA vac Low Risk  Adult (1 of 2 - PCV13) 02/17/2015  . COLONOSCOPY  01/10/2016    Patient Care Team: Vivi Barrack, MD as PCP - General (Family Medicine) Kathrynn Ducking, MD as Consulting Physician (Neurology) Marin Olp Rudell Cobb, MD as Consulting Physician (Oncology)  Indicate any recent Medical Services you may have received from other than [facility] providers in the past year (date may be approximate).     Assessment:   This is a routine wellness examination for [name] [name].  Hearing/Vision screen No exam data present  Dietary issues and exercise activities discussed: Current Exercise Habits: The patient does not participate in regular exercise at present  Goals   None    Depression Screen PHQ 2/9 Scores 09/21/2019 09/17/2018 05/07/2018  PHQ - 2 Score 0 0 0  PHQ- 9 Score - 5 3    Fall Risk Fall Risk  09/21/2019 05/07/2018  Falls in the past year? 0 No  Injury with Fall? 0 -  Risk for fall due to : Impaired balance/gait;Impaired mobility -  Follow up Falls evaluation completed;Education provided;Falls prevention discussed -    Is the patient's home free of loose throw rugs in walkways, pet beds, electrical cords, etc?   yes      Grab bars in the bathroom? yes      Handrails on the stairs?   yes      Adequate lighting?   yes   Cognitive Function: no cognitive concerns at this time      6CIT Screen 09/21/2019  What Year? 0 points  What month? 0 points  What time? 0 points   Count back from 20 0 points  Months in reverse 0 points  Repeat phrase 0 points  Total Score 0    Screening Tests Health Maintenance  Topic Date Due  . TETANUS/TDAP  02/16/1969  . PNA vac Low Risk Adult (1 of 2 - PCV13) 02/17/2015  . COLONOSCOPY  01/10/2016  . MAMMOGRAM  05/24/2021  . INFLUENZA VACCINE  Completed  . DEXA SCAN  Completed  . Hepatitis C Screening  Completed    Qualifies for Shingles Vaccine? Discussed and patient will check with pharmacy for coverage.  Patient education handout provided   Cancer Screenings: Lung: Low Dose CT Chest recommended if Age 15-80 years, 30 pack-year currently smoking OR have quit w/in 15years. Patient does not qualify. Breast: Up to date on Mammogram? Yes   Up to date of Bone Density/Dexa? No; recommended  Colorectal: Patient recently received Cologuard kits and plans to complete   Plan:  I have personally reviewed and addressed the Medicare Annual Wellness questionnaire and have noted the following in the patient's chart:  A. Medical and social history B. Use of alcohol, tobacco or illicit drugs  C. Current medications and supplements D. Functional ability and status E.  Nutritional status F.  Physical activity G. Advance directives H. List of other physicians I.  Hospitalizations, surgeries, and ER visits in previous 12 months J.  [place] such as hearing and vision if needed, cognitive and depression L. Referrals, records requested, and appointments- none   In addition, I have reviewed and discussed with patient certain preventive protocols, quality metrics, and best practice recommendations. A written personalized care plan for preventive services as well as general preventive health recommendations were provided to patient.   Signed,  Denman George, LPN  Nurse Health Advisor   Nurse Notes: no additional

## 2019-10-21 ENCOUNTER — Other Ambulatory Visit: Payer: Self-pay | Admitting: Family Medicine

## 2019-11-21 ENCOUNTER — Other Ambulatory Visit: Payer: Self-pay | Admitting: Family Medicine

## 2019-12-10 ENCOUNTER — Telehealth: Payer: Self-pay | Admitting: Family Medicine

## 2019-12-10 NOTE — Telephone Encounter (Signed)
Patient calls in wanting carbidopa-levodopa (SINEMET IR) 25-250 MG tablet authorized for a refill until she sees her new Neurologist, she needed to switch due to insurance.

## 2019-12-10 NOTE — Telephone Encounter (Signed)
Left detailed message that Rx was sent on 11/22/2019

## 2019-12-29 ENCOUNTER — Institutional Professional Consult (permissible substitution): Payer: Medicare Other | Admitting: Neurology

## 2019-12-30 DIAGNOSIS — H40013 Open angle with borderline findings, low risk, bilateral: Secondary | ICD-10-CM | POA: Diagnosis not present

## 2020-01-12 ENCOUNTER — Telehealth: Payer: Self-pay | Admitting: Family Medicine

## 2020-01-12 NOTE — Telephone Encounter (Signed)
Chief Complaint Health information question (non symptomatic) Reason for Call Symptomatic / Request for Health Information Initial Comment Caller says that she wants to know if she can have the [place] vaccine because she has a pre-existing condition Translation No Nurse Assessment Nurse: Clovis Riley, RN, Georgina Peer Date/Time (Eastern Time): 01/11/2020 6:26:43 PM Confirm and document reason for call. If symptomatic, describe symptoms. ---Caller says that she wants to know if she can have the [place] vaccine because she has a pre-existing condition, Parkinsons. Has the patient had close contact with a person known or suspected to have the novel coronavirus illness OR traveled / lives in area with major community spread (including international travel) in the last 14 days from the onset of symptoms? * If Asymptomatic, screen for exposure and travel within the last 14 days. ---No Does the patient have any new or worsening symptoms? ---Yes Will a triage be completed? ---Yes Related visit to physician within the last 2 weeks? ---No Does the PT have any chronic conditions? (i.e. diabetes, asthma, this includes High risk factors for pregnancy, etc.) ---Yes List chronic conditions. ---parkinsons Is this a behavioral health or substance abuse call? ---No Guidelines Guideline Title Affirmed Question Affirmed Notes Nurse Date/Time (Eastern Time) Coronavirus (COVID-19) - Vaccine Questions and Reactions COVID-19 vaccine, Frequently Asked Questions (FAQs) [place], RN, Georgina Peer 01/11/2020 6:27:28 PM

## 2020-01-12 NOTE — Telephone Encounter (Signed)
See below

## 2020-01-12 NOTE — Telephone Encounter (Signed)
Should be fine for her to get the J&J vaccine but also recommend she talk with her neurologist as well.  Algis Greenhouse. Jerline Pain, MD 01/12/2020 10:48 AM

## 2020-01-12 NOTE — Telephone Encounter (Signed)
Called and spoke with pt and gave her Dr. Jerline Pain recommendation below.

## 2020-01-14 ENCOUNTER — Other Ambulatory Visit: Payer: Self-pay | Admitting: Family Medicine

## 2020-01-24 ENCOUNTER — Telehealth: Payer: Self-pay

## 2020-01-24 NOTE — Telephone Encounter (Signed)
Patient calling in regarding a neurologist referral. Patient would like to stay within the [facility] practice.Please follow up with patient

## 2020-01-24 NOTE — Telephone Encounter (Signed)
If I am here, please transfer calls to me. Thanks!

## 2020-01-26 ENCOUNTER — Telehealth: Payer: Self-pay

## 2020-01-26 NOTE — Telephone Encounter (Signed)
Error

## 2020-01-31 ENCOUNTER — Encounter (HOSPITAL_COMMUNITY): Payer: Self-pay

## 2020-01-31 ENCOUNTER — Telehealth: Payer: Self-pay | Admitting: Family Medicine

## 2020-01-31 ENCOUNTER — Emergency Department (HOSPITAL_COMMUNITY)
Admission: EM | Admit: 2020-01-31 | Discharge: 2020-02-01 | Disposition: A | Discharge status: Home / Self Care | Payer: Medicare Other | Attending: Emergency Medicine | Admitting: Emergency Medicine

## 2020-01-31 ENCOUNTER — Other Ambulatory Visit: Payer: Self-pay

## 2020-01-31 DIAGNOSIS — D1803 Hemangioma of intra-abdominal structures: Secondary | ICD-10-CM | POA: Diagnosis not present

## 2020-01-31 DIAGNOSIS — K219 Gastro-esophageal reflux disease without esophagitis: Secondary | ICD-10-CM | POA: Diagnosis not present

## 2020-01-31 DIAGNOSIS — K5901 Slow transit constipation: Secondary | ICD-10-CM | POA: Diagnosis not present

## 2020-01-31 DIAGNOSIS — G20 Parkinson's disease: Secondary | ICD-10-CM | POA: Diagnosis not present

## 2020-01-31 DIAGNOSIS — Z79899 Other long term (current) drug therapy: Secondary | ICD-10-CM | POA: Diagnosis not present

## 2020-01-31 DIAGNOSIS — K5900 Constipation, unspecified: Secondary | ICD-10-CM | POA: Diagnosis present

## 2020-01-31 DIAGNOSIS — I10 Essential (primary) hypertension: Secondary | ICD-10-CM | POA: Diagnosis not present

## 2020-01-31 LAB — BASIC METABOLIC PANEL
Anion gap: 9 (ref 5–15)
BUN: 11 mg/dL (ref 8–23)
CO2: 25 mmol/L (ref 22–32)
Calcium: 9.6 mg/dL (ref 8.9–10.3)
Chloride: 106 mmol/L (ref 98–111)
Creatinine, Ser: 0.69 mg/dL (ref 0.44–1.00)
GFR calc Af Amer: >60 mL/min (ref >60)
GFR calc non Af Amer: >60 mL/min (ref >60)
Glucose, Bld: 80 mg/dL (ref 70–99)
Potassium: 4.1 mmol/L (ref 3.5–5.1)
Sodium: 140 mmol/L (ref 135–145)

## 2020-01-31 NOTE — Telephone Encounter (Signed)
Daughter has called checking on referral for neurology.  States she is on her way to take patient to the ED due to not being able to use the bathroom.   States patient prefers a female MD but will go with any MD who specializes in Parkinson's who can get patient in to be seen.

## 2020-01-31 NOTE — ED Triage Notes (Signed)
Pt arrives to ED w/ c/o constipation x 1 week. Pt endorses n/v but states no emesis in last 24 hours. Pt states last BM was today after she took laxative.

## 2020-02-01 ENCOUNTER — Emergency Department (HOSPITAL_COMMUNITY): Payer: Medicare Other

## 2020-02-01 ENCOUNTER — Telehealth: Payer: Self-pay | Admitting: Family Medicine

## 2020-02-01 DIAGNOSIS — D1803 Hemangioma of intra-abdominal structures: Secondary | ICD-10-CM | POA: Diagnosis not present

## 2020-02-01 LAB — CBC WITH DIFFERENTIAL/PLATELET
Abs Immature Granulocytes: 0.01 10*3/uL (ref 0.00–0.07)
Basophils Absolute: 0 10*3/uL (ref 0.0–0.1)
Basophils Relative: 0 %
Eosinophils Absolute: 0.1 10*3/uL (ref 0.0–0.5)
Eosinophils Relative: 2 %
HCT: 41.5 % (ref 36.0–46.0)
Hemoglobin: 13.2 g/dL (ref 12.0–15.0)
Immature Granulocytes: 0 %
Lymphocytes Relative: 26 %
Lymphs Abs: 1.5 10*3/uL (ref 0.7–4.0)
MCH: 28.8 pg (ref 26.0–34.0)
MCHC: 31.8 g/dL (ref 30.0–36.0)
MCV: 90.4 fL (ref 80.0–100.0)
Monocytes Absolute: 0.5 10*3/uL (ref 0.1–1.0)
Monocytes Relative: 8 %
Neutro Abs: 3.5 10*3/uL (ref 1.7–7.7)
Neutrophils Relative %: 64 %
Platelets: 137 10*3/uL — ABNORMAL LOW (ref 150–400)
RBC: 4.59 MIL/uL (ref 3.87–5.11)
RDW: 12.7 % (ref 11.5–15.5)
WBC: 5.6 10*3/uL (ref 4.0–10.5)
nRBC: 0 % (ref 0.0–0.2)

## 2020-02-01 LAB — HEPATIC FUNCTION PANEL
ALT: 15 U/L (ref 0–44)
AST: 28 U/L (ref 15–41)
Albumin: 4.4 g/dL (ref 3.5–5.0)
Alkaline Phosphatase: 48 U/L (ref 38–126)
Bilirubin, Direct: 0.3 mg/dL — ABNORMAL HIGH (ref 0.0–0.2)
Indirect Bilirubin: 0.6 mg/dL (ref 0.3–0.9)
Total Bilirubin: 0.9 mg/dL (ref 0.3–1.2)
Total Protein: 7.5 g/dL (ref 6.5–8.1)

## 2020-02-01 LAB — TROPONIN I (HIGH SENSITIVITY): Troponin I (High Sensitivity): 6 ng/L (ref <18)

## 2020-02-01 LAB — LIPASE, BLOOD: Lipase: 20 U/L (ref 11–51)

## 2020-02-01 MED ORDER — IOHEXOL 300 MG/ML  SOLN
100.0000 mL | Freq: Once | INTRAMUSCULAR | Status: AC | PRN
  Administered 2020-02-01: 100 mL via INTRAVENOUS

## 2020-02-01 NOTE — ED Provider Notes (Signed)
[facility] EMERGENCY DEPARTMENT Provider Note   CSN: [id number] Arrival date & time: 01/31/20  1930     History Chief Complaint  Patient presents with  . Constipation    [name] [name] is a 69 y.o. female with a h/o of Parkinson Disease, osteoarthritis, GERD, osteopenia, and uterine fibroids who presents to the emergency department with a chief complaint of constipation.  The patient reports she has been struggling with constipation for the last 1 to 2 weeks.  Reports that she recently changed her diet over the last few weeks and has been eating more salads.  She feels as though she noted the constipation after the dietary change.  Reports that she has been taking Mylanta almost daily since onset of symptoms.  She is also taken multiple suppositories and milk of magnesia twice.  She last used milk of magnesia at noon.    She reports that after taking the medication that she developed some "fluttering" in her lower chest and upper mid-abdomen.  She reports that the symptoms persisted until she had a bowel movement.  She also notes that prior to having a bowel movement that she feels as if her arms and legs "locked up" and a family member had to help carry her to the bathroom.  She reports that she had a similar "fluttering" sensation when she use milk of magnesia approximately 4 days earlier and has also noticed the same sensation after eating.  She also notes that she has been feeling more bloated and has been feeling that she needs to burp.  She reports that she previously struggled with indigestion and GERD and notes that the fluttering feels similar.  She denies choking episodes, dysphagia, or globus sensation.  She denies fever, chills, shortness of breath, cough, palpitations, dysuria, vaginal bleeding or discharge, back pain, numbness or weakness, headache, vomiting, nausea, diarrhea, hematemesis, melena, hematochezia.  No weight loss.  She also notes that she has been  having some posterior pain to her left knee that has been intermittent for the last few months.  Pain is brought on with standing and ambulation and resolves with rest.  Pain has not been worsening since onset.  No recent falls.  She reports that she is currently looking to get established with a new neurologist to be followed for Parkinson disease.  She denies any recent medication changes.   The history is provided by the patient. No language interpreter was used.      Past Medical History:  Diagnosis Date  . Fibroid   . GERD (gastroesophageal reflux disease)   . Muscle weakness of lower extremity 01/17/2015  . Osteopenia   . Parkinson disease ([place]) 01/17/2015    Patient Active Problem List   Diagnosis Date Noted  . DDD (degenerative disc disease), cervical 06/18/2019  . Constipation 03/02/2019  . Seasonal allergies 03/02/2019  . Anxiety 05/07/2018  . Fatigue 04/20/2018  . Osteoarthritis 04/20/2018  . Palpitations 04/20/2018  . Cardiomegaly 04/20/2018  . Lesion of liver 04/20/2018  . Lung nodule 04/20/2018  . Muscle weakness of lower extremity 01/17/2015  . Parkinson disease ([place]) 01/17/2015  . Fibroid uterus 11/13/2012  . Benign colonic polyp 10/02/2012    Past Surgical History:  Procedure Laterality Date  . FINGER SURGERY    . MOUTH SURGERY    . TOE SURGERY    . TONSILLECTOMY    . TUBAL LIGATION     x2     OB History    Gravida  5  Para  5   Term  5   Preterm      AB      Living  5     SAB      TAB      Ectopic      Multiple      Live Births              Family History  Problem Relation Age of Onset  . Rheum arthritis Mother   . Breast cancer Mother        3's  . Cancer Father        throat  . Breast cancer Sister        71's  . Hypertension Brother   . Hyperlipidemia Brother   . Heart attack Son        Deceased, 54  . Healthy Daughter   . Parkinsonism Neg Hx     Social History   Tobacco Use  . Smoking status: Never  Smoker  . Smokeless tobacco: Never Used  Substance Use Topics  . Alcohol use: No    Alcohol/week: 0.0 standard drinks  . Drug use: No    Home Medications Prior to Admission medications   Medication Sig Start Date End Date Taking? Authorizing Provider  Ascorbic Acid (VITAMIN C PO) Take 1 tablet by mouth daily.    [provider]  b complex vitamins capsule Take 1 capsule by mouth daily.    [provider]  carbidopa-levodopa (SINEMET IR) 25-250 MG tablet TAKE 1 TABLET BY MOUTH THREE TIMES DAILY 01/14/20   Vivi Barrack, MD  Cholecalciferol (VITAMIN D) 2000 UNITS CAPS Take 2,000 Units by mouth daily.    [provider]  diazepam (VALIUM) 2 MG tablet Take 1 tablet (2 mg total) by mouth every 12 (twelve) hours as needed for anxiety. 09/17/18   Vivi Barrack, MD  Diclofenac Sodium (PENNSAID) 2 % SOLN Place 1-2 Pump onto the skin 4 (four) times daily as needed. 05/14/19   Vivi Barrack, MD  gabapentin (NEURONTIN) 100 MG capsule Take 2 capsules (200 mg total) by mouth at bedtime. 06/17/19   Lyndal Pulley, DO  glucosamine-chondroitin 500-400 MG tablet Take 1 tablet by mouth daily.    [provider]  ibuprofen (ADVIL,MOTRIN) 200 MG tablet Take 400 mg by mouth every 6 (six) hours as needed for moderate pain.     [provider]  ketorolac (TORADOL) 10 MG tablet Take 1 tablet (10 mg total) by mouth every 6 (six) hours as needed. 08/05/19   Vivi Barrack, MD  Magnesium Hydroxide (MAGNESIA PO) Take 1 tablet by mouth daily.    [provider]  Multiple Vitamin (MULTIVITAMIN) capsule Take 1 capsule by mouth daily.    [provider]  Omega-3 Fatty Acids (OMEGA 3 PO) Take 1 tablet by mouth daily.    [provider]  tetrahydrozoline-zinc (VISINE-AC) 0.05-0.25 % ophthalmic solution Place 2 drops into both eyes 4 (four) times daily as needed (allergies).    [provider]  vitamin B-12 (CYANOCOBALAMIN) 1000 MCG tablet  Take by mouth.    [provider]  amantadine (SYMMETREL) 100 MG capsule Take 1 capsule (100 mg total) by mouth 2 (two) times daily. Patient not taking: Reported on 09/21/2019 08/05/19 02/01/20  Vivi Barrack, MD    Allergies    Sulfa antibiotics  Review of Systems   Review of Systems  Constitutional: Positive for appetite change. Negative for activity change, chills, diaphoresis,  fever and unexpected weight change.  Respiratory: Negative for shortness of breath and wheezing.   Cardiovascular: Positive for chest pain.  Gastrointestinal: Positive for constipation. Negative for abdominal pain, diarrhea, nausea, rectal pain and vomiting.  Genitourinary: Negative for dysuria, pelvic pain, vaginal discharge and vaginal pain.  Musculoskeletal: Negative for arthralgias, back pain, myalgias, neck pain and neck stiffness.  Skin: Negative for rash and wound.  Allergic/Immunologic: Negative for immunocompromised state.  Neurological: Negative for seizures, syncope, weakness, numbness and headaches.  Psychiatric/Behavioral: Negative for confusion.   Physical Exam Updated Vital Signs BP 126/73 (BP Location: Left Arm)   Pulse 80   Temp 98.4 F (36.9 C) (Oral)   Resp 16   SpO2 100%   Physical Exam Vitals and nursing note reviewed.  Constitutional:      General: She is not in acute distress.    Comments: Thin female.  No acute distress.  Nontoxic-appearing.  HENT:     Head: Normocephalic.     Mouth/Throat:     Pharynx: No oropharyngeal exudate or posterior oropharyngeal erythema.  Eyes:     Extraocular Movements: Extraocular movements intact.     Conjunctiva/sclera: Conjunctivae normal.     Pupils: Pupils are equal, round, and reactive to light.  Cardiovascular:     Rate and Rhythm: Normal rate and regular rhythm.     Pulses: Normal pulses.     Heart sounds: Normal heart sounds. No murmur. No friction rub. No gallop.   Pulmonary:     Effort: Pulmonary effort is normal. No  respiratory distress.     Breath sounds: No stridor. No wheezing, rhonchi or rales.  Chest:     Chest wall: No tenderness.  Abdominal:     General: There is no distension.     Palpations: Abdomen is soft.     Tenderness: There is no abdominal tenderness.     Comments: Abdomen is soft, nontender, nondistended.  No palpable masses.  Negative Murphy sign.  No CVA tenderness.  No tenderness over McBurney's point.  Hyperactive bowel sounds in all 4 quadrants.  No peritoneal signs.  Musculoskeletal:     Cervical back: Normal range of motion and neck supple.     Right lower leg: No edema.     Left lower leg: No edema.  Skin:    General: Skin is warm.     Coloration: Skin is not jaundiced or pale.     Findings: No erythema or rash.  Neurological:     Mental Status: She is alert.  Psychiatric:        Behavior: Behavior normal.     ED Results / Procedures / Treatments   Labs (all labs ordered are listed, but only abnormal results are displayed) Labs Reviewed  HEPATIC FUNCTION PANEL - Abnormal; Notable for the following components:      Result Value   Bilirubin, Direct 0.3 (*)    All other components within normal limits  CBC WITH DIFFERENTIAL/PLATELET - Abnormal; Notable for the following components:   Platelets 137 (*)    All other components within normal limits  BASIC METABOLIC PANEL  LIPASE, BLOOD  URINALYSIS, ROUTINE W REFLEX MICROSCOPIC  TROPONIN I (HIGH SENSITIVITY)    EKG EKG Interpretation  Date/Time:  Tuesday February 01 2020 03:37:58 EDT Ventricular Rate:  78 PR Interval:  120 QRS Duration: 86 QT Interval:  376 QTC Calculation: 428 R Axis:   9 Text Interpretation: Normal sinus rhythm Normal ECG Artifact in V1 No significant change since last tracing Confirmed  by Pryor Curia [phone]) on 02/01/2020 5:02:18 AM   Radiology CT ABDOMEN PELVIS W CONTRAST  Result Date: 02/01/2020 CLINICAL DATA:  Constipation for root 2 weeks. EXAM: CT ABDOMEN AND PELVIS WITH CONTRAST  TECHNIQUE: Multidetector CT imaging of the abdomen and pelvis was performed using the standard protocol following bolus administration of intravenous contrast. CONTRAST:  181mL OMNIPAQUE IOHEXOL 300 MG/ML  SOLN COMPARISON:  07/08/2018 FINDINGS: Lower chest:  Left lower lobe scarring. Hepatobiliary: 7 liver masses with either solid hypervascular appearance or peripheral discontinuous nodular enhancement, marked on the portal venous phase. These are consistent with hemangioma and are known from previous imaging. The largest is in the lateral right liver at 4.6 cm. No in evidence of acute hemorrhage.No evidence of biliary obstruction or stone. Pancreas: Unremarkable. Spleen: Unremarkable. Adrenals/Urinary Tract: Negative adrenals. No hydronephrosis or stone. 3 cm left renal cyst. Unremarkable bladder. Stomach/Bowel: No obstruction. No appendicitis. No abnormal stool retention. Vascular/Lymphatic: No acute vascular abnormality. Negative for adenopathy. Bilobed cystic density in the small bowel mesentery has decreased in size to 3.5 cm, previously 4 cm. This remains most consistent with a lymphocele/lymphangioma. Reproductive:8 cm uterine mass with dystrophic calcifications consistent with fibroid. Fluid collection in the left groin most consistent with inguinal hernia containing ascitic fluid. Other: No ascites or pneumoperitoneum. Musculoskeletal: No acute abnormalities. Exaggerated lumbar lordosis with focal advanced L5-S1 disc degeneration. IMPRESSION: 1. No acute finding. No abnormal stool retention to correlate with history of constipation. 2. Multiple hepatic hemangiomas. 3. Fibroid uterus. 4. Continued decrease in size of lymphangioma or similar benign process and the small bowel mesentery. 5. Fluid containing left inguinal hernia. Electronically Signed   By: Monte Fantasia M.D.   On: 02/01/2020 06:04    Procedures Procedures (including critical care time)  Medications Ordered in ED Medications  iohexol  (OMNIPAQUE) 300 MG/ML solution 100 mL (100 mLs Intravenous Contrast Given 02/01/20 0540)    ED Course  I have reviewed the triage vital signs and the nursing notes.  Pertinent labs & imaging results that were available during my care of the patient were reviewed by me and considered in my medical decision making (see chart for details).    MDM Rules/Calculators/A&P                      69 year old female with a h/o of Parkinson Disease, osteoarthritis, GERD, osteopenia, and uterine fibroids who presents to the emergency department with a chief complaint of constipation requiring multiple laxatives over the last 2 weeks and "fluttering" in her chest/upper abdomen.  The patient was discussed with Dr. Leonides Schanz, attending physician.  She is having no dysphagia, globus sensation.  Symptoms do not seem concerning for esophageal dysmotility.  Given that she is also feeling more bloated and the sensation to belch, this seems more in line with GERD.  Labs are reassuring.  Given epigastric and chest pain, considered ACS, but troponin is not elevated and EKG demonstrates normal sinus rhythm.  HEART score is 2.  CBC is normal aside from a mild thrombocytopenia, which appears chronic.  On abdominal exam, abdomen is soft and nontender without peritoneal signs or obvious masses. However, given acute changes in her bowels, will order CT abdomen pelvis for further evaluation.  On my evaluation of CT abdomen pelvis, there are no acute findings.  No significant constipation.  CT results discussed with the patient and her daughter at bedside.  She was successfully fluid challenge.  Given her history of Parkinson disease, the patient was advised  to avoid laxatives.  Recommended increasing her fluid intake and using MiraLAX.  Also advised the patient to start on Pepcid for GERD.  Low suspicion for ACS, pancreatitis, cholecystitis, mesenteric ischemia, diverticulitis, or bowel obstruction at this time.  She is hemodynamically  stable to no acute distress.  Safe for discharge home with outpatient follow-up as needed.     Final Clinical Impression(s) / ED Diagnoses Final diagnoses:  Slow transit constipation  Gastroesophageal reflux disease, unspecified whether esophagitis present    Rx / DC Orders ED Discharge Orders    None       Navea Woodrow A, PA-C 02/01/20 0900    Ward, Delice Bison, DO 02/03/20 2329

## 2020-02-01 NOTE — ED Notes (Signed)
Patient verbalizes understanding of discharge instructions. Opportunity for questioning and answers were provided. Armband removed by staff, pt discharged from ED. Pt. ambulatory and discharged home.  

## 2020-02-01 NOTE — Discharge Instructions (Signed)
Thank you for allowing me to care for you today in the Emergency Department.   Avoid taking laxatives and milk of magnesia for constipation.  With Parkinson's disease, it is important to try to manage your constipation with dietary changes, including increasing your fiber intake and drinking at least 64 ounces of fluids daily.  Start taking MiraLAX daily.  You can cut this dose in half if you start having frequent, loose bowel movements.  MiraLAX is available over-the-counter.  Probiotics are also available over-the-counter.  Taking 1 daily may also help with constipation.  For the fluttering in your stomach, try taking Pepcid, which is available over-the-counter.  If your symptoms do not improve with this management, follow-up with primary care or with your new neurologist.  Return to the emergency department if you develop high fevers, if you have severe constipation and stop passing gas, develop severe, uncontrollable abdominal pain, uncontrollable vomiting, severe shortness of breath, or other new, concerning symptoms.

## 2020-02-01 NOTE — Telephone Encounter (Signed)
I have spoken with the patient and the patients daughter - they would like to see a female provider at [facility] - the referral from Oct has expired.  Please place a new referral and in the comments, please specify Dr Rexene Alberts or Dr Krista Blue - who ever has the first availability.

## 2020-02-01 NOTE — Telephone Encounter (Signed)
Patient called in and states that she would like a referral to [facility], their number [phone].

## 2020-02-02 ENCOUNTER — Telehealth: Payer: Self-pay | Admitting: Neurology

## 2020-02-02 ENCOUNTER — Other Ambulatory Visit: Payer: Self-pay

## 2020-02-02 DIAGNOSIS — G20 Parkinson's disease: Secondary | ICD-10-CM

## 2020-02-02 NOTE — Telephone Encounter (Signed)
Patient has been referred back to [place] for Parkinson's. She has seen Dr. Jannifer Franklin in the past (2018), but is requesting to switch to a female provider-ideally Dr. Rexene Alberts. Would you both be ok with the switch?

## 2020-02-02 NOTE — Telephone Encounter (Signed)
Patient has seen multiple neurologists in the past, including Drs. Tristan Schroeder, [facility] and has also requested to switch to Drs. Tonuzi and [facility]. I will decline this transfer.

## 2020-02-02 NOTE — Telephone Encounter (Signed)
Noted! Thank you

## 2020-02-02 NOTE — Telephone Encounter (Signed)
Referral placed.

## 2020-02-03 ENCOUNTER — Telehealth: Payer: Self-pay | Admitting: Family Medicine

## 2020-02-03 NOTE — Telephone Encounter (Signed)
Please advise 

## 2020-02-03 NOTE — Telephone Encounter (Signed)
Pt daughter calling - Mirolax isn't helping her mother - she would like another recommendation on a stool softener that her mother can take. Please call the daughter back ( Melvinie [phone] - she is on the dpr )

## 2020-02-04 ENCOUNTER — Telehealth: Payer: Self-pay | Admitting: Family Medicine

## 2020-02-04 NOTE — Telephone Encounter (Signed)
Pt daughter called asking if Dr. Jerline Pain could put in referral or recommendation for pt to have care takers (CNAs) brought into her home to help pt with ALDs. Please advise.

## 2020-02-04 NOTE — Telephone Encounter (Signed)
Patient notified and voices understanding 

## 2020-02-04 NOTE — Telephone Encounter (Signed)
Recommend trying OTC senna in addition to the miralax.  Would like for her to let us know if still having issues with this and we can discuss prescription medications.   Algis Greenhouse. Jerline Pain, MD 02/04/2020 9:37 AM

## 2020-02-07 NOTE — Telephone Encounter (Signed)
Please advise 

## 2020-02-07 NOTE — Telephone Encounter (Signed)
I am ok with referral to home health but medicare will require a face to face visit first.  Algis Greenhouse. Jerline Pain, MD 02/07/2020 11:48 AM

## 2020-02-07 NOTE — Telephone Encounter (Signed)
Unable to LVM, voice mail is full

## 2020-02-08 NOTE — Telephone Encounter (Signed)
Patient notified she voices understanding,She will call to schedule appt

## 2020-02-16 DIAGNOSIS — G20 Parkinson's disease: Secondary | ICD-10-CM | POA: Diagnosis not present

## 2020-02-16 DIAGNOSIS — R269 Unspecified abnormalities of gait and mobility: Secondary | ICD-10-CM | POA: Diagnosis not present

## 2020-02-19 ENCOUNTER — Other Ambulatory Visit: Payer: Self-pay | Admitting: Family Medicine

## 2020-02-26 DIAGNOSIS — G20 Parkinson's disease: Secondary | ICD-10-CM | POA: Diagnosis not present

## 2020-02-26 DIAGNOSIS — R2689 Other abnormalities of gait and mobility: Secondary | ICD-10-CM | POA: Diagnosis not present

## 2020-02-26 DIAGNOSIS — K5900 Constipation, unspecified: Secondary | ICD-10-CM | POA: Diagnosis not present

## 2020-02-26 DIAGNOSIS — M1991 Primary osteoarthritis, unspecified site: Secondary | ICD-10-CM | POA: Diagnosis not present

## 2020-02-26 DIAGNOSIS — M5030 Other cervical disc degeneration, unspecified cervical region: Secondary | ICD-10-CM | POA: Diagnosis not present

## 2020-02-26 DIAGNOSIS — R911 Solitary pulmonary nodule: Secondary | ICD-10-CM | POA: Diagnosis not present

## 2020-02-26 DIAGNOSIS — G629 Polyneuropathy, unspecified: Secondary | ICD-10-CM | POA: Diagnosis not present

## 2020-02-26 DIAGNOSIS — I517 Cardiomegaly: Secondary | ICD-10-CM | POA: Diagnosis not present

## 2020-02-26 DIAGNOSIS — Z86010 Personal history of colonic polyps: Secondary | ICD-10-CM | POA: Diagnosis not present

## 2020-03-02 ENCOUNTER — Telehealth: Payer: Self-pay

## 2020-03-02 NOTE — Telephone Encounter (Signed)
Pt requesting GI referral  Stated has constipation problems x 1 month  Using fiber, Miralax and Milk of magnesia with little help  Having a BM every 2-3 days

## 2020-03-02 NOTE — Telephone Encounter (Signed)
Patient requesting a referral. Patient wouldn't provide to much information .Please follow up with patient

## 2020-03-03 DIAGNOSIS — K5900 Constipation, unspecified: Secondary | ICD-10-CM | POA: Diagnosis not present

## 2020-03-03 DIAGNOSIS — Z86010 Personal history of colonic polyps: Secondary | ICD-10-CM | POA: Diagnosis not present

## 2020-03-03 DIAGNOSIS — R911 Solitary pulmonary nodule: Secondary | ICD-10-CM | POA: Diagnosis not present

## 2020-03-03 DIAGNOSIS — G629 Polyneuropathy, unspecified: Secondary | ICD-10-CM | POA: Diagnosis not present

## 2020-03-03 DIAGNOSIS — G20 Parkinson's disease: Secondary | ICD-10-CM | POA: Diagnosis not present

## 2020-03-03 DIAGNOSIS — M1991 Primary osteoarthritis, unspecified site: Secondary | ICD-10-CM | POA: Diagnosis not present

## 2020-03-03 DIAGNOSIS — M5030 Other cervical disc degeneration, unspecified cervical region: Secondary | ICD-10-CM | POA: Diagnosis not present

## 2020-03-03 DIAGNOSIS — R2689 Other abnormalities of gait and mobility: Secondary | ICD-10-CM | POA: Diagnosis not present

## 2020-03-03 DIAGNOSIS — I517 Cardiomegaly: Secondary | ICD-10-CM | POA: Diagnosis not present

## 2020-03-06 NOTE — Telephone Encounter (Signed)
She has an appointment later this week - we can discuss then.  Algis Greenhouse. Jerline Pain, MD 03/06/2020 8:12 AM

## 2020-03-07 DIAGNOSIS — G20 Parkinson's disease: Secondary | ICD-10-CM | POA: Diagnosis not present

## 2020-03-07 DIAGNOSIS — K5900 Constipation, unspecified: Secondary | ICD-10-CM | POA: Diagnosis not present

## 2020-03-07 DIAGNOSIS — M1991 Primary osteoarthritis, unspecified site: Secondary | ICD-10-CM | POA: Diagnosis not present

## 2020-03-07 DIAGNOSIS — M5030 Other cervical disc degeneration, unspecified cervical region: Secondary | ICD-10-CM | POA: Diagnosis not present

## 2020-03-07 DIAGNOSIS — G629 Polyneuropathy, unspecified: Secondary | ICD-10-CM | POA: Diagnosis not present

## 2020-03-07 DIAGNOSIS — Z86010 Personal history of colonic polyps: Secondary | ICD-10-CM | POA: Diagnosis not present

## 2020-03-07 DIAGNOSIS — I517 Cardiomegaly: Secondary | ICD-10-CM | POA: Diagnosis not present

## 2020-03-07 DIAGNOSIS — R911 Solitary pulmonary nodule: Secondary | ICD-10-CM | POA: Diagnosis not present

## 2020-03-07 DIAGNOSIS — R2689 Other abnormalities of gait and mobility: Secondary | ICD-10-CM | POA: Diagnosis not present

## 2020-03-10 ENCOUNTER — Other Ambulatory Visit: Payer: Self-pay

## 2020-03-10 ENCOUNTER — Telehealth: Payer: Self-pay | Admitting: Family Medicine

## 2020-03-10 ENCOUNTER — Encounter: Payer: Self-pay | Admitting: Family Medicine

## 2020-03-10 ENCOUNTER — Ambulatory Visit (INDEPENDENT_AMBULATORY_CARE_PROVIDER_SITE_OTHER): Payer: Medicare Other | Admitting: Family Medicine

## 2020-03-10 VITALS — BP 110/70 | HR 98 | Temp 95.0°F | Ht 67.0 in | Wt 118.0 lb

## 2020-03-10 DIAGNOSIS — M1991 Primary osteoarthritis, unspecified site: Secondary | ICD-10-CM | POA: Diagnosis not present

## 2020-03-10 DIAGNOSIS — R911 Solitary pulmonary nodule: Secondary | ICD-10-CM | POA: Diagnosis not present

## 2020-03-10 DIAGNOSIS — Z86010 Personal history of colonic polyps: Secondary | ICD-10-CM | POA: Diagnosis not present

## 2020-03-10 DIAGNOSIS — G629 Polyneuropathy, unspecified: Secondary | ICD-10-CM | POA: Diagnosis not present

## 2020-03-10 DIAGNOSIS — I517 Cardiomegaly: Secondary | ICD-10-CM | POA: Diagnosis not present

## 2020-03-10 DIAGNOSIS — M5030 Other cervical disc degeneration, unspecified cervical region: Secondary | ICD-10-CM | POA: Diagnosis not present

## 2020-03-10 DIAGNOSIS — G20 Parkinson's disease: Secondary | ICD-10-CM | POA: Diagnosis not present

## 2020-03-10 DIAGNOSIS — K5900 Constipation, unspecified: Secondary | ICD-10-CM

## 2020-03-10 DIAGNOSIS — R2689 Other abnormalities of gait and mobility: Secondary | ICD-10-CM | POA: Diagnosis not present

## 2020-03-10 MED ORDER — LINACLOTIDE 72 MCG PO CAPS: 72.0000 ug | ORAL_CAPSULE | Freq: Every day | ORAL | 5 refills | Status: DC

## 2020-03-10 MED ORDER — LUBIPROSTONE 8 MCG PO CAPS: 8.0000 ug | ORAL_CAPSULE | Freq: Two times a day (BID) | ORAL | 1 refills | Status: DC

## 2020-03-10 NOTE — Progress Notes (Signed)
   [name] [name] is a 69 y.o. female who presents today for an office visit.  Assessment/Plan:  Chronic Problems Addressed Today: Constipation Worsened.  No significant improvement with several over-the-counter remedies.  Will start low-dose Linzess 72 mcg daily.  Encourage good oral hydration and plenty of fiber supplementation.  She is due for colonoscopy as well-we will place referral to GI.     Subjective:  HPI:  Has had worsening constipation.  Tried several over-the-counter medications including milk of magnesia which works modestly well.  Is also tried MiraLAX.  She has difficulty with passing stool.  Has a bowel movement about every day though usually consist of small hard pellets.       Objective:  Physical Exam: BP 110/70 (BP Location: Left Arm, Patient Position: Sitting, Cuff Size: Normal)   Pulse 98   Temp (!) 95 F (35 C) (Temporal)   Ht 5\' 7"  (1.702 m)   Wt 118 lb (53.5 kg)   SpO2 99%   BMI 18.48 kg/m   Gen: No acute distress, resting comfortably CV: Regular rate and rhythm with no murmurs appreciated Pulm: Normal work of breathing, clear to auscultation bilaterally with no crackles, wheezes, or rhonchi GI: S, NT, ND, BS present Neuro: Grossly normal, moves all extremities Psych: Normal affect and thought content      [name] Curto M. Jerline Pain, MD 03/10/2020 1:46 PM

## 2020-03-10 NOTE — Telephone Encounter (Signed)
Please see message and advise 

## 2020-03-10 NOTE — Patient Instructions (Signed)
It was very nice to see you today!  Please start Linzess.  Please make sure that you are getting plenty of fiber in your diet and plenty of water.  Also place referral for you to the GI specialist  Take care, Dr Jerline Pain  Please try these tips to maintain a healthy lifestyle:   Eat at least 3 REAL meals and 1-2 snacks per day.  Aim for no more than 5 hours between eating.  If you eat breakfast, please do so within one hour of getting up.    Each meal should contain half fruits/vegetables, one quarter protein, and one quarter carbs (no bigger than a computer mouse)   Cut down on sweet beverages. This includes juice, soda, and sweet tea.     Drink at least 1 glass of water with each meal and aim for at least 8 glasses per day   Exercise at least 150 minutes every week.

## 2020-03-10 NOTE — Assessment & Plan Note (Addendum)
Worsened.  No significant improvement with several over-the-counter remedies.  Will start low-dose Linzess 72 mcg daily.  Encourage good oral hydration and plenty of fiber supplementation.  She is due for colonoscopy as well-we will place referral to GI.

## 2020-03-10 NOTE — Telephone Encounter (Signed)
Left detailed message stating Rx sent in and to give [place] a call if not affordable.

## 2020-03-10 NOTE — Addendum Note (Signed)
Addended by: Vivi Barrack on: 03/10/2020 04:44 PM   Modules accepted: Orders

## 2020-03-10 NOTE — Telephone Encounter (Signed)
See below.  Algis Greenhouse. Jerline Pain, MD 03/10/2020 4:45 PM

## 2020-03-10 NOTE — Telephone Encounter (Signed)
Patient's daughter is calling in to let Dr.Parker know that the pharmacy is charging $400 for the constipation medicine, asked if there is something else that can be sent in.

## 2020-03-14 DIAGNOSIS — R2689 Other abnormalities of gait and mobility: Secondary | ICD-10-CM | POA: Diagnosis not present

## 2020-03-14 DIAGNOSIS — K5900 Constipation, unspecified: Secondary | ICD-10-CM | POA: Diagnosis not present

## 2020-03-14 DIAGNOSIS — G20 Parkinson's disease: Secondary | ICD-10-CM | POA: Diagnosis not present

## 2020-03-14 DIAGNOSIS — M1991 Primary osteoarthritis, unspecified site: Secondary | ICD-10-CM | POA: Diagnosis not present

## 2020-03-14 DIAGNOSIS — G629 Polyneuropathy, unspecified: Secondary | ICD-10-CM | POA: Diagnosis not present

## 2020-03-14 DIAGNOSIS — Z86010 Personal history of colonic polyps: Secondary | ICD-10-CM | POA: Diagnosis not present

## 2020-03-14 DIAGNOSIS — R911 Solitary pulmonary nodule: Secondary | ICD-10-CM | POA: Diagnosis not present

## 2020-03-14 DIAGNOSIS — I517 Cardiomegaly: Secondary | ICD-10-CM | POA: Diagnosis not present

## 2020-03-14 DIAGNOSIS — M5030 Other cervical disc degeneration, unspecified cervical region: Secondary | ICD-10-CM | POA: Diagnosis not present

## 2020-03-23 DIAGNOSIS — G629 Polyneuropathy, unspecified: Secondary | ICD-10-CM | POA: Diagnosis not present

## 2020-03-23 DIAGNOSIS — R2689 Other abnormalities of gait and mobility: Secondary | ICD-10-CM | POA: Diagnosis not present

## 2020-03-23 DIAGNOSIS — M1991 Primary osteoarthritis, unspecified site: Secondary | ICD-10-CM | POA: Diagnosis not present

## 2020-03-23 DIAGNOSIS — K5900 Constipation, unspecified: Secondary | ICD-10-CM | POA: Diagnosis not present

## 2020-03-23 DIAGNOSIS — R911 Solitary pulmonary nodule: Secondary | ICD-10-CM | POA: Diagnosis not present

## 2020-03-23 DIAGNOSIS — M5030 Other cervical disc degeneration, unspecified cervical region: Secondary | ICD-10-CM | POA: Diagnosis not present

## 2020-03-23 DIAGNOSIS — G20 Parkinson's disease: Secondary | ICD-10-CM | POA: Diagnosis not present

## 2020-03-23 DIAGNOSIS — Z86010 Personal history of colonic polyps: Secondary | ICD-10-CM | POA: Diagnosis not present

## 2020-03-23 DIAGNOSIS — I517 Cardiomegaly: Secondary | ICD-10-CM | POA: Diagnosis not present

## 2020-03-28 DIAGNOSIS — G20 Parkinson's disease: Secondary | ICD-10-CM | POA: Diagnosis not present

## 2020-03-28 DIAGNOSIS — G629 Polyneuropathy, unspecified: Secondary | ICD-10-CM | POA: Diagnosis not present

## 2020-03-28 DIAGNOSIS — Z86010 Personal history of colonic polyps: Secondary | ICD-10-CM | POA: Diagnosis not present

## 2020-03-28 DIAGNOSIS — M1991 Primary osteoarthritis, unspecified site: Secondary | ICD-10-CM | POA: Diagnosis not present

## 2020-03-28 DIAGNOSIS — R911 Solitary pulmonary nodule: Secondary | ICD-10-CM | POA: Diagnosis not present

## 2020-03-28 DIAGNOSIS — R2689 Other abnormalities of gait and mobility: Secondary | ICD-10-CM | POA: Diagnosis not present

## 2020-03-28 DIAGNOSIS — M5030 Other cervical disc degeneration, unspecified cervical region: Secondary | ICD-10-CM | POA: Diagnosis not present

## 2020-03-28 DIAGNOSIS — K5900 Constipation, unspecified: Secondary | ICD-10-CM | POA: Diagnosis not present

## 2020-03-28 DIAGNOSIS — I517 Cardiomegaly: Secondary | ICD-10-CM | POA: Diagnosis not present

## 2020-03-29 DIAGNOSIS — G629 Polyneuropathy, unspecified: Secondary | ICD-10-CM | POA: Diagnosis not present

## 2020-03-29 DIAGNOSIS — M5030 Other cervical disc degeneration, unspecified cervical region: Secondary | ICD-10-CM | POA: Diagnosis not present

## 2020-03-29 DIAGNOSIS — R269 Unspecified abnormalities of gait and mobility: Secondary | ICD-10-CM | POA: Diagnosis not present

## 2020-03-29 DIAGNOSIS — K5900 Constipation, unspecified: Secondary | ICD-10-CM | POA: Diagnosis not present

## 2020-03-29 DIAGNOSIS — Z86010 Personal history of colonic polyps: Secondary | ICD-10-CM | POA: Diagnosis not present

## 2020-03-29 DIAGNOSIS — R2689 Other abnormalities of gait and mobility: Secondary | ICD-10-CM | POA: Diagnosis not present

## 2020-03-29 DIAGNOSIS — M1991 Primary osteoarthritis, unspecified site: Secondary | ICD-10-CM | POA: Diagnosis not present

## 2020-03-29 DIAGNOSIS — R911 Solitary pulmonary nodule: Secondary | ICD-10-CM | POA: Diagnosis not present

## 2020-03-29 DIAGNOSIS — I517 Cardiomegaly: Secondary | ICD-10-CM | POA: Diagnosis not present

## 2020-03-29 DIAGNOSIS — G20 Parkinson's disease: Secondary | ICD-10-CM | POA: Diagnosis not present

## 2020-04-04 DIAGNOSIS — R2689 Other abnormalities of gait and mobility: Secondary | ICD-10-CM | POA: Diagnosis not present

## 2020-04-04 DIAGNOSIS — M5030 Other cervical disc degeneration, unspecified cervical region: Secondary | ICD-10-CM | POA: Diagnosis not present

## 2020-04-04 DIAGNOSIS — I517 Cardiomegaly: Secondary | ICD-10-CM | POA: Diagnosis not present

## 2020-04-04 DIAGNOSIS — K5900 Constipation, unspecified: Secondary | ICD-10-CM | POA: Diagnosis not present

## 2020-04-04 DIAGNOSIS — G20 Parkinson's disease: Secondary | ICD-10-CM | POA: Diagnosis not present

## 2020-04-04 DIAGNOSIS — R911 Solitary pulmonary nodule: Secondary | ICD-10-CM | POA: Diagnosis not present

## 2020-04-04 DIAGNOSIS — Z86010 Personal history of colonic polyps: Secondary | ICD-10-CM | POA: Diagnosis not present

## 2020-04-04 DIAGNOSIS — M1991 Primary osteoarthritis, unspecified site: Secondary | ICD-10-CM | POA: Diagnosis not present

## 2020-04-04 DIAGNOSIS — G629 Polyneuropathy, unspecified: Secondary | ICD-10-CM | POA: Diagnosis not present

## 2020-04-06 DIAGNOSIS — I517 Cardiomegaly: Secondary | ICD-10-CM | POA: Diagnosis not present

## 2020-04-06 DIAGNOSIS — R911 Solitary pulmonary nodule: Secondary | ICD-10-CM | POA: Diagnosis not present

## 2020-04-06 DIAGNOSIS — G20 Parkinson's disease: Secondary | ICD-10-CM | POA: Diagnosis not present

## 2020-04-06 DIAGNOSIS — G629 Polyneuropathy, unspecified: Secondary | ICD-10-CM | POA: Diagnosis not present

## 2020-04-06 DIAGNOSIS — K5900 Constipation, unspecified: Secondary | ICD-10-CM | POA: Diagnosis not present

## 2020-04-06 DIAGNOSIS — M1991 Primary osteoarthritis, unspecified site: Secondary | ICD-10-CM | POA: Diagnosis not present

## 2020-04-06 DIAGNOSIS — M5030 Other cervical disc degeneration, unspecified cervical region: Secondary | ICD-10-CM | POA: Diagnosis not present

## 2020-04-06 DIAGNOSIS — Z86010 Personal history of colonic polyps: Secondary | ICD-10-CM | POA: Diagnosis not present

## 2020-04-06 DIAGNOSIS — R2689 Other abnormalities of gait and mobility: Secondary | ICD-10-CM | POA: Diagnosis not present

## 2020-04-11 DIAGNOSIS — K5900 Constipation, unspecified: Secondary | ICD-10-CM | POA: Diagnosis not present

## 2020-04-11 DIAGNOSIS — M5030 Other cervical disc degeneration, unspecified cervical region: Secondary | ICD-10-CM | POA: Diagnosis not present

## 2020-04-11 DIAGNOSIS — Z86010 Personal history of colonic polyps: Secondary | ICD-10-CM | POA: Diagnosis not present

## 2020-04-11 DIAGNOSIS — R2689 Other abnormalities of gait and mobility: Secondary | ICD-10-CM | POA: Diagnosis not present

## 2020-04-11 DIAGNOSIS — M1991 Primary osteoarthritis, unspecified site: Secondary | ICD-10-CM | POA: Diagnosis not present

## 2020-04-11 DIAGNOSIS — G20 Parkinson's disease: Secondary | ICD-10-CM | POA: Diagnosis not present

## 2020-04-11 DIAGNOSIS — I517 Cardiomegaly: Secondary | ICD-10-CM | POA: Diagnosis not present

## 2020-04-11 DIAGNOSIS — G629 Polyneuropathy, unspecified: Secondary | ICD-10-CM | POA: Diagnosis not present

## 2020-04-11 DIAGNOSIS — R911 Solitary pulmonary nodule: Secondary | ICD-10-CM | POA: Diagnosis not present

## 2020-04-13 ENCOUNTER — Ambulatory Visit: Payer: Medicare Other | Admitting: Physician Assistant

## 2020-04-13 ENCOUNTER — Ambulatory Visit: Payer: Medicare Other | Admitting: Gastroenterology

## 2020-04-14 ENCOUNTER — Telehealth: Payer: Self-pay | Admitting: Family Medicine

## 2020-04-14 ENCOUNTER — Telehealth: Payer: Self-pay

## 2020-04-14 ENCOUNTER — Other Ambulatory Visit: Payer: Self-pay

## 2020-04-14 MED ORDER — ESCITALOPRAM OXALATE 10 MG PO TABS: 10.0000 mg | ORAL_TABLET | Freq: Every day | ORAL | 0 refills | Status: DC

## 2020-04-14 NOTE — Telephone Encounter (Signed)
Patient was on Lexapro requesting a few pills until OV with Dr.Parker she is having some Anxiety. Please advise

## 2020-04-14 NOTE — Telephone Encounter (Signed)
Patient's daughter is calling in this afternoon asking if Dr.Parker is able to give them a prescription for Madopar 200 MG for the patients parkinson's, states it is an overseas drug that they are able to get as long as she has a prescription.

## 2020-04-14 NOTE — Telephone Encounter (Signed)
Rx sent in

## 2020-04-14 NOTE — Telephone Encounter (Signed)
Ok to refill x 30 days

## 2020-04-14 NOTE — Telephone Encounter (Signed)
Patient Requesting Madopar 200 MG , & wants to restart Lexapro Please advise

## 2020-04-18 DIAGNOSIS — R2689 Other abnormalities of gait and mobility: Secondary | ICD-10-CM | POA: Diagnosis not present

## 2020-04-18 DIAGNOSIS — M1991 Primary osteoarthritis, unspecified site: Secondary | ICD-10-CM | POA: Diagnosis not present

## 2020-04-18 DIAGNOSIS — M5030 Other cervical disc degeneration, unspecified cervical region: Secondary | ICD-10-CM | POA: Diagnosis not present

## 2020-04-18 DIAGNOSIS — G20 Parkinson's disease: Secondary | ICD-10-CM | POA: Diagnosis not present

## 2020-04-18 DIAGNOSIS — I517 Cardiomegaly: Secondary | ICD-10-CM | POA: Diagnosis not present

## 2020-04-18 DIAGNOSIS — K5900 Constipation, unspecified: Secondary | ICD-10-CM | POA: Diagnosis not present

## 2020-04-18 DIAGNOSIS — G629 Polyneuropathy, unspecified: Secondary | ICD-10-CM | POA: Diagnosis not present

## 2020-04-18 DIAGNOSIS — Z86010 Personal history of colonic polyps: Secondary | ICD-10-CM | POA: Diagnosis not present

## 2020-04-18 DIAGNOSIS — R911 Solitary pulmonary nodule: Secondary | ICD-10-CM | POA: Diagnosis not present

## 2020-04-18 NOTE — Telephone Encounter (Signed)
Ok to restart lexapro.  I am not able to write prescriptions for medications not available in the [place].  Algis Greenhouse. Jerline Pain, MD 04/18/2020 8:07 AM

## 2020-04-18 NOTE — Telephone Encounter (Signed)
Pt notified Ok to restart Lexapro. Unable to prescribed Madopar, not available in [place] Pt verbalized understanding

## 2020-04-20 ENCOUNTER — Ambulatory Visit: Payer: Medicare Other | Admitting: Nurse Practitioner

## 2020-04-24 ENCOUNTER — Telehealth: Payer: Self-pay

## 2020-04-24 DIAGNOSIS — M1991 Primary osteoarthritis, unspecified site: Secondary | ICD-10-CM | POA: Diagnosis not present

## 2020-04-24 DIAGNOSIS — R911 Solitary pulmonary nodule: Secondary | ICD-10-CM | POA: Diagnosis not present

## 2020-04-24 DIAGNOSIS — G20 Parkinson's disease: Secondary | ICD-10-CM | POA: Diagnosis not present

## 2020-04-24 DIAGNOSIS — I517 Cardiomegaly: Secondary | ICD-10-CM | POA: Diagnosis not present

## 2020-04-24 DIAGNOSIS — Z86010 Personal history of colonic polyps: Secondary | ICD-10-CM | POA: Diagnosis not present

## 2020-04-24 DIAGNOSIS — K5900 Constipation, unspecified: Secondary | ICD-10-CM | POA: Diagnosis not present

## 2020-04-24 DIAGNOSIS — R2689 Other abnormalities of gait and mobility: Secondary | ICD-10-CM | POA: Diagnosis not present

## 2020-04-24 DIAGNOSIS — M5030 Other cervical disc degeneration, unspecified cervical region: Secondary | ICD-10-CM | POA: Diagnosis not present

## 2020-04-24 DIAGNOSIS — G629 Polyneuropathy, unspecified: Secondary | ICD-10-CM | POA: Diagnosis not present

## 2020-04-24 NOTE — Telephone Encounter (Signed)
Pt would like a med refill for Lexapro 10 mg tablet. Pt states that it is working and she is so happy. Pt would like to be notified if prescription is sent in.

## 2020-04-25 DIAGNOSIS — G20 Parkinson's disease: Secondary | ICD-10-CM | POA: Diagnosis not present

## 2020-04-25 DIAGNOSIS — G629 Polyneuropathy, unspecified: Secondary | ICD-10-CM | POA: Diagnosis not present

## 2020-04-25 DIAGNOSIS — M1991 Primary osteoarthritis, unspecified site: Secondary | ICD-10-CM | POA: Diagnosis not present

## 2020-04-25 DIAGNOSIS — M5030 Other cervical disc degeneration, unspecified cervical region: Secondary | ICD-10-CM | POA: Diagnosis not present

## 2020-04-25 DIAGNOSIS — I517 Cardiomegaly: Secondary | ICD-10-CM | POA: Diagnosis not present

## 2020-04-25 DIAGNOSIS — H40023 Open angle with borderline findings, high risk, bilateral: Secondary | ICD-10-CM | POA: Diagnosis not present

## 2020-04-25 DIAGNOSIS — Z86010 Personal history of colonic polyps: Secondary | ICD-10-CM | POA: Diagnosis not present

## 2020-04-25 DIAGNOSIS — K5900 Constipation, unspecified: Secondary | ICD-10-CM | POA: Diagnosis not present

## 2020-04-25 DIAGNOSIS — R2689 Other abnormalities of gait and mobility: Secondary | ICD-10-CM | POA: Diagnosis not present

## 2020-04-25 DIAGNOSIS — R911 Solitary pulmonary nodule: Secondary | ICD-10-CM | POA: Diagnosis not present

## 2020-05-04 DIAGNOSIS — G629 Polyneuropathy, unspecified: Secondary | ICD-10-CM | POA: Diagnosis not present

## 2020-05-04 DIAGNOSIS — M1991 Primary osteoarthritis, unspecified site: Secondary | ICD-10-CM | POA: Diagnosis not present

## 2020-05-04 DIAGNOSIS — M5030 Other cervical disc degeneration, unspecified cervical region: Secondary | ICD-10-CM | POA: Diagnosis not present

## 2020-05-04 DIAGNOSIS — K5900 Constipation, unspecified: Secondary | ICD-10-CM | POA: Diagnosis not present

## 2020-05-04 DIAGNOSIS — I517 Cardiomegaly: Secondary | ICD-10-CM | POA: Diagnosis not present

## 2020-05-04 DIAGNOSIS — R911 Solitary pulmonary nodule: Secondary | ICD-10-CM | POA: Diagnosis not present

## 2020-05-04 DIAGNOSIS — G20 Parkinson's disease: Secondary | ICD-10-CM | POA: Diagnosis not present

## 2020-05-04 DIAGNOSIS — Z86010 Personal history of colonic polyps: Secondary | ICD-10-CM | POA: Diagnosis not present

## 2020-05-06 ENCOUNTER — Other Ambulatory Visit: Payer: Self-pay | Admitting: Family Medicine

## 2020-05-09 DIAGNOSIS — I517 Cardiomegaly: Secondary | ICD-10-CM | POA: Diagnosis not present

## 2020-05-09 DIAGNOSIS — M5030 Other cervical disc degeneration, unspecified cervical region: Secondary | ICD-10-CM | POA: Diagnosis not present

## 2020-05-09 DIAGNOSIS — M1991 Primary osteoarthritis, unspecified site: Secondary | ICD-10-CM | POA: Diagnosis not present

## 2020-05-09 DIAGNOSIS — G20 Parkinson's disease: Secondary | ICD-10-CM | POA: Diagnosis not present

## 2020-05-09 DIAGNOSIS — K5900 Constipation, unspecified: Secondary | ICD-10-CM | POA: Diagnosis not present

## 2020-05-09 DIAGNOSIS — G629 Polyneuropathy, unspecified: Secondary | ICD-10-CM | POA: Diagnosis not present

## 2020-05-09 DIAGNOSIS — Z86010 Personal history of colonic polyps: Secondary | ICD-10-CM | POA: Diagnosis not present

## 2020-05-09 DIAGNOSIS — R911 Solitary pulmonary nodule: Secondary | ICD-10-CM | POA: Diagnosis not present

## 2020-05-10 ENCOUNTER — Telehealth: Payer: Self-pay

## 2020-05-10 NOTE — Telephone Encounter (Signed)
Pt wants to know what pain medicine she can take with lexapro?

## 2020-05-10 NOTE — Telephone Encounter (Signed)
Notified patient its ok to take Tylenol if needed and IBU as needed if no history of GI bleeding. Patient voices understanding.

## 2020-05-16 DIAGNOSIS — M5030 Other cervical disc degeneration, unspecified cervical region: Secondary | ICD-10-CM | POA: Diagnosis not present

## 2020-05-16 DIAGNOSIS — K5900 Constipation, unspecified: Secondary | ICD-10-CM | POA: Diagnosis not present

## 2020-05-16 DIAGNOSIS — Z86010 Personal history of colonic polyps: Secondary | ICD-10-CM | POA: Diagnosis not present

## 2020-05-16 DIAGNOSIS — G20 Parkinson's disease: Secondary | ICD-10-CM | POA: Diagnosis not present

## 2020-05-16 DIAGNOSIS — I517 Cardiomegaly: Secondary | ICD-10-CM | POA: Diagnosis not present

## 2020-05-16 DIAGNOSIS — M1991 Primary osteoarthritis, unspecified site: Secondary | ICD-10-CM | POA: Diagnosis not present

## 2020-05-16 DIAGNOSIS — R911 Solitary pulmonary nodule: Secondary | ICD-10-CM | POA: Diagnosis not present

## 2020-05-16 DIAGNOSIS — G629 Polyneuropathy, unspecified: Secondary | ICD-10-CM | POA: Diagnosis not present

## 2020-05-22 ENCOUNTER — Ambulatory Visit (INDEPENDENT_AMBULATORY_CARE_PROVIDER_SITE_OTHER): Payer: Medicare Other | Admitting: Physician Assistant

## 2020-05-22 ENCOUNTER — Encounter: Payer: Self-pay | Admitting: Physician Assistant

## 2020-05-22 VITALS — BP 104/80 | HR 102 | Ht 67.0 in | Wt 110.0 lb

## 2020-05-22 DIAGNOSIS — K5900 Constipation, unspecified: Secondary | ICD-10-CM

## 2020-05-22 DIAGNOSIS — Z1212 Encounter for screening for malignant neoplasm of rectum: Secondary | ICD-10-CM

## 2020-05-22 DIAGNOSIS — H40053 Ocular hypertension, bilateral: Secondary | ICD-10-CM | POA: Diagnosis not present

## 2020-05-22 DIAGNOSIS — Z1211 Encounter for screening for malignant neoplasm of colon: Secondary | ICD-10-CM | POA: Diagnosis not present

## 2020-05-22 NOTE — Progress Notes (Signed)
Chief Complaint: Constipation  HPI:    [name] [name] is a 69 year old African-American female with a past medical history of Parkinson's disease and others listed below, known to Dr. Carlean Purl, who was referred to me by Vivi Barrack, MD for a complaint of constipation.      01/09/2006 colonoscopy with diverticulosis and internal hemorrhoids.  Repeat recommended in 10 years.     03/10/2020 patient seen in clinic by her PCP for constipation.  Patient had no improvement with several over-the-counter remedies.  She was started on low-dose Linzess 72 mcg daily.  Also discussed she is due for colonoscopy and she was referred to our clinic.    Today, the patient presents to clinic accompanied by her daughter who does assist with history.  She explains that she was diagnosed with Parkinson's in 2016 and over the past 6 months or so has started with constipation which "they told me would happen".  Tells me that currently she is using Dulcolax on a daily basis as well as some herbal chews which is allowing her to have a bowel movement every other day.  Tells me she tried the Linzess 72 mcg daily but this was very strong for her and left her in the bathroom all day long with loose stools.    Also discusses that she knows she is due for a colonoscopy but would like to wait on this until she feels a little bit stronger.    Denies fever, chills, weight loss, blood in her stool, nausea, vomiting or symptoms that awaken her from sleep.  Past Medical History:  Diagnosis Date  . Fibroid   . GERD (gastroesophageal reflux disease)   . Muscle weakness of lower extremity 01/17/2015  . Osteopenia   . Parkinson disease ([employer]) 01/17/2015    Past Surgical History:  Procedure Laterality Date  . FINGER SURGERY    . MOUTH SURGERY    . TOE SURGERY    . TONSILLECTOMY    . TUBAL LIGATION     x2    Current Outpatient Medications  Medication Sig Dispense Refill  . Ascorbic Acid (VITAMIN C PO) Take 1 tablet by mouth  daily.    Marland Kitchen b complex vitamins capsule Take 1 capsule by mouth daily.    . carbidopa-levodopa (SINEMET IR) 25-250 MG tablet TAKE 1 TABLET BY MOUTH THREE TIMES DAILY 90 tablet 0  . Cholecalciferol (VITAMIN D) 2000 UNITS CAPS Take 2,000 Units by mouth daily.    . diazepam (VALIUM) 2 MG tablet Take 1 tablet (2 mg total) by mouth every 12 (twelve) hours as needed for anxiety. (Patient not taking: Reported on 03/10/2020) 30 tablet 0  . Diclofenac Sodium (PENNSAID) 2 % SOLN Place 1-2 Pump onto the skin 4 (four) times daily as needed. (Patient not taking: Reported on 03/10/2020) 112 g 1  . escitalopram (LEXAPRO) 10 MG tablet TAKE 1 TABLET(10 MG) BY MOUTH DAILY 30 tablet 0  . gabapentin (NEURONTIN) 100 MG capsule Take 2 capsules (200 mg total) by mouth at bedtime. (Patient not taking: Reported on 03/10/2020) 180 capsule 0  . glucosamine-chondroitin 500-400 MG tablet Take 1 tablet by mouth daily.    Marland Kitchen ibuprofen (ADVIL,MOTRIN) 200 MG tablet Take 400 mg by mouth every 6 (six) hours as needed for moderate pain.     Marland Kitchen ketorolac (TORADOL) 10 MG tablet Take 1 tablet (10 mg total) by mouth every 6 (six) hours as needed. 20 tablet 0  . linaclotide (LINZESS) 72 MCG capsule Take 1 capsule (72  mcg total) by mouth daily before breakfast. 30 capsule 5  . lubiprostone (AMITIZA) 8 MCG capsule Take 1 capsule (8 mcg total) by mouth 2 (two) times daily with a meal. 60 capsule 1  . Magnesium Hydroxide (MAGNESIA PO) Take 1 tablet by mouth daily.    . Multiple Vitamin (MULTIVITAMIN) capsule Take 1 capsule by mouth daily.    . Omega-3 Fatty Acids (OMEGA 3 PO) Take 1 tablet by mouth daily.    Marland Kitchen tetrahydrozoline-zinc (VISINE-AC) 0.05-0.25 % ophthalmic solution Place 2 drops into both eyes 4 (four) times daily as needed (allergies).    . vitamin B-12 (CYANOCOBALAMIN) 1000 MCG tablet Take by mouth.     No current facility-administered medications for this visit.    Allergies as of 05/22/2020 - Review Complete 03/10/2020  Allergen  Reaction Noted  . Sulfa antibiotics Other (See Comments) 10/02/2012    Family History  Problem Relation Age of Onset  . Rheum arthritis Mother   . Breast cancer Mother        60's  . Cancer Father        throat  . Breast cancer Sister        32's  . Hypertension Brother   . Hyperlipidemia Brother   . Heart attack Son        Deceased, 21  . Healthy Daughter   . Parkinsonism Neg Hx     Social History   Socioeconomic History  . Marital status: Divorced    Spouse name: Not on file  . Number of children: 4  . Years of education: B.A.  . Highest education level: Not on file  Occupational History  . Occupation: SECRETARY  Tobacco Use  . Smoking status: Never Smoker  . Smokeless tobacco: Never Used  Substance and Sexual Activity  . Alcohol use: No    Alcohol/week: 0.0 standard drinks  . Drug use: No  . Sexual activity: Never  Other Topics Concern  . Not on file  Social History Narrative   Patient is right handed.   Patient drinks 2-3 cups of caffeine per day.   Lives alone in a 2 story home.  Has 4 living children.  1 passed away at age 46.     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 Difficulty of Paying Living Expenses:   Food Insecurity:   . Worried About Charity fundraiser in the Last Year:   . Arboriculturist in the Last Year:   Transportation Needs:   . Film/video editor (Medical):   Marland Kitchen Lack of Transportation (Non-Medical):   Physical Activity:   . Days of Exercise per Week:   . Minutes of Exercise per Session:   Stress:   . Feeling of Stress :   Social Connections:   . Frequency of Communication with Friends and Family:   . Frequency of Social Gatherings with Friends and Family:   . Attends Religious Services:   . Active Member of Clubs or Organizations:   . Attends Archivist Meetings:   Marland Kitchen Marital Status:     Intimate Partner Violence:   . Fear of Current or Ex-Partner:   . Emotionally Abused:   Marland Kitchen Physically Abused:   . Sexually Abused:     Review of Systems:    Constitutional: No weight loss, fever or chills Skin: No rash  Cardiovascular:  No chest pain  Respiratory: No SOB  Gastrointestinal: See HPI and otherwise negative Genitourinary: No dysuria  Neurological: No headache, dizziness or syncope Musculoskeletal: No new muscle or joint pain Hematologic: No bleeding  Psychiatric: No history of depression or anxiety   Physical Exam:  Vital signs: BP 104/80   Pulse 102   Ht 5\' 7"  (1.702 m)   Wt 110 lb (49.9 kg)   BMI 17.23 kg/m   Constitutional:   Pleasant AA female appears to be in NAD, Well developed, Well nourished, alert and cooperative Head:  Normocephalic and atraumatic. Eyes:   PEERL, EOMI. No icterus. Conjunctiva pink. Ears:  Normal auditory acuity. Neck:  Supple Throat: Oral cavity and pharynx without inflammation, swelling or lesion.  Respiratory: Respirations even and unlabored. Lungs clear to auscultation bilaterally.   No wheezes, crackles, or rhonchi.  Cardiovascular: Normal S1, S2. No MRG. Regular rate and rhythm. No peripheral edema, cyanosis or pallor.  Gastrointestinal:  Soft, nondistended, nontender. No rebound or guarding. Normal bowel sounds. No appreciable masses or hepatomegaly. Rectal:  Not performed.  Msk:  Symmetrical without gross deformities. Without edema, no deformity or joint abnormality. Ambulates with cane  Neurologic:  Alert and  oriented x4;  grossly normal neurologically.  Skin:   Dry and intact without significant lesions or rashes. Psychiatric:  Demonstrates good judgement and reason without abnormal affect or behaviors.  RELEVANT LABS AND IMAGING: CBC    Component Value Date/Time   WBC 5.6 02/01/2020 0300   RBC 4.59 02/01/2020 0300   HGB 13.2 02/01/2020 0300   HGB 13.1 06/03/2018 1103   HCT 41.5 02/01/2020 0300   PLT 137 (L)  02/01/2020 0300   PLT 155 06/03/2018 1103   MCV 90.4 02/01/2020 0300   MCH 28.8 02/01/2020 0300   MCHC 31.8 02/01/2020 0300   RDW 12.7 02/01/2020 0300   LYMPHSABS 1.5 02/01/2020 0300   MONOABS 0.5 02/01/2020 0300   EOSABS 0.1 02/01/2020 0300   BASOSABS 0.0 02/01/2020 0300    CMP     Component Value Date/Time   NA 140 01/31/2020 2048   K 4.1 01/31/2020 2048   CL 106 01/31/2020 2048   CO2 25 01/31/2020 2048   GLUCOSE 80 01/31/2020 2048   BUN 11 01/31/2020 2048   CREATININE 0.69 01/31/2020 2048   CREATININE 0.79 06/03/2018 1103   CALCIUM 9.6 01/31/2020 2048   PROT 7.5 02/01/2020 0058   ALBUMIN 4.4 02/01/2020 0058   AST 28 02/01/2020 0058   AST 17 06/03/2018 1103   ALT 15 02/01/2020 0058   ALT 11 06/03/2018 1103   ALKPHOS 48 02/01/2020 0058   BILITOT 0.9 02/01/2020 0058   BILITOT 0.5 06/03/2018 1103   GFRNONAA >60 01/31/2020 2048   GFRNONAA >60 06/03/2018 1103   GFRAA >60 01/31/2020 2048   GFRAA >60 06/03/2018 1103    Assessment: 1.  Change in bowel habits: Towards constipation over the past 6 months or so, was diagnosed with Parkinson's which she was told would cause constipation, some better with Dulcolax and a herbal supplement 2.  Screening for colon cancer: Last colonoscopy in 2007 with recommendations for repeat in 10 years  Plan: 1.  Discussed with the patient that she can continue her current regimen if this is working for her or switch to [name].  Discussed titration of MiraLAX up to 4 times a day to a soft solid bowel movement daily or every other day. 2.  Discussed patient's colonoscopy.  She is due but she would like to wait  another month or so until she feels a little bit stronger.  She will call back and let us know when she is ready. 3.  Patient to follow in clinic with [place] as needed.  Ellouise Newer, PA-C [facility] 05/22/2020, 3:16 PM  Cc: Vivi Barrack, MD

## 2020-05-22 NOTE — Patient Instructions (Signed)
If you are age 70 or older, your body mass index should be between 23-30. Your Body mass index is 17.23 kg/m. If this is out of the aforementioned range listed, please consider follow up with your Primary Care Provider.  If you are age 35 or younger, your body mass index should be between 19-25. Your Body mass index is 17.23 kg/m. If this is out of the aformentioned range listed, please consider follow up with your Primary Care Provider.   Miralax 1 capful in 8 oz of liquid daily

## 2020-05-23 DIAGNOSIS — M5030 Other cervical disc degeneration, unspecified cervical region: Secondary | ICD-10-CM | POA: Diagnosis not present

## 2020-05-23 DIAGNOSIS — R911 Solitary pulmonary nodule: Secondary | ICD-10-CM | POA: Diagnosis not present

## 2020-05-23 DIAGNOSIS — M1991 Primary osteoarthritis, unspecified site: Secondary | ICD-10-CM | POA: Diagnosis not present

## 2020-05-23 DIAGNOSIS — I517 Cardiomegaly: Secondary | ICD-10-CM | POA: Diagnosis not present

## 2020-05-23 DIAGNOSIS — K5900 Constipation, unspecified: Secondary | ICD-10-CM | POA: Diagnosis not present

## 2020-05-23 DIAGNOSIS — G629 Polyneuropathy, unspecified: Secondary | ICD-10-CM | POA: Diagnosis not present

## 2020-05-23 DIAGNOSIS — Z86010 Personal history of colonic polyps: Secondary | ICD-10-CM | POA: Diagnosis not present

## 2020-05-23 DIAGNOSIS — G20 Parkinson's disease: Secondary | ICD-10-CM | POA: Diagnosis not present

## 2020-06-02 ENCOUNTER — Other Ambulatory Visit: Payer: Self-pay | Admitting: Family Medicine

## 2020-06-15 ENCOUNTER — Telehealth: Payer: Self-pay | Admitting: Family Medicine

## 2020-06-15 ENCOUNTER — Other Ambulatory Visit: Payer: Self-pay

## 2020-06-15 ENCOUNTER — Other Ambulatory Visit: Payer: Self-pay | Admitting: Family Medicine

## 2020-06-15 MED ORDER — CARBIDOPA-LEVODOPA 25-250 MG PO TABS: 1.0000 | ORAL_TABLET | Freq: Three times a day (TID) | ORAL | 1 refills | Status: DC

## 2020-06-15 NOTE — Telephone Encounter (Signed)
Patient is asking if she is feeling fatigue more than normal and is requesting to take some iron to help and if she can how much should she take

## 2020-06-15 NOTE — Telephone Encounter (Signed)
Patient was informed a safe amount of iron to take OTC is 65 MG every other day.

## 2020-06-15 NOTE — Telephone Encounter (Signed)
Agree with recommendations.  Algis Greenhouse. Jerline Pain, MD 06/15/2020 3:39 PM

## 2020-06-16 DIAGNOSIS — Z1231 Encounter for screening mammogram for malignant neoplasm of breast: Secondary | ICD-10-CM | POA: Diagnosis not present

## 2020-06-16 LAB — HM MAMMOGRAPHY

## 2020-07-08 ENCOUNTER — Other Ambulatory Visit: Payer: Self-pay | Admitting: Family Medicine

## 2020-07-26 ENCOUNTER — Encounter: Payer: Self-pay | Admitting: Family Medicine

## 2020-07-28 DIAGNOSIS — H40053 Ocular hypertension, bilateral: Secondary | ICD-10-CM | POA: Diagnosis not present

## 2020-08-03 ENCOUNTER — Telehealth (INDEPENDENT_AMBULATORY_CARE_PROVIDER_SITE_OTHER): Payer: Medicare Other | Admitting: Family Medicine

## 2020-08-03 VITALS — Ht 67.0 in | Wt 125.0 lb

## 2020-08-03 DIAGNOSIS — F419 Anxiety disorder, unspecified: Secondary | ICD-10-CM

## 2020-08-03 DIAGNOSIS — G20 Parkinson's disease: Secondary | ICD-10-CM

## 2020-08-03 MED ORDER — ESCITALOPRAM OXALATE 20 MG PO TABS: ORAL_TABLET | ORAL | 3 refills | Status: DC

## 2020-08-03 NOTE — Progress Notes (Signed)
   [name] [name] is a 69 y.o. female who presents today for a virtual office visit.  Assessment/Plan:  Chronic Problems Addressed Today: Parkinson disease Stable.  Continue management per neurology.  Anxiety Has worsened recently but has been tolerating Lexapro well.  Will increase Lexapro to 20 mg daily.  She will check with me in a couple weeks to let me know how it is going.     Subjective:  HPI:  See A/p.        Objective/Observations  Physical Exam: Gen: NAD, resting comfortably Pulm: Normal work of breathing Neuro: Grossly normal, moves all extremities Psych: Normal affect and thought content  Virtual Visit via Video   I connected with [name] [name] on 08/03/20 at  3:40 PM EDT by a video enabled telemedicine application and verified that I am speaking with the correct person using two identifiers. The limitations of evaluation and management by telemedicine and the availability of in person appointments were discussed. The patient expressed understanding and agreed to proceed.   Patient location: Home Provider location: [place] participating in the virtual visit: Myself and Patient     Algis Greenhouse. Jerline Pain, MD 08/03/2020 3:48 PM

## 2020-08-03 NOTE — Assessment & Plan Note (Signed)
Stable.  Continue management per neurology. 

## 2020-08-03 NOTE — Assessment & Plan Note (Signed)
Has worsened recently but has been tolerating Lexapro well.  Will increase Lexapro to 20 mg daily.  She will check with me in a couple weeks to let me know how it is going.

## 2020-08-07 ENCOUNTER — Other Ambulatory Visit: Payer: Self-pay | Admitting: Family Medicine

## 2020-10-05 DIAGNOSIS — H40053 Ocular hypertension, bilateral: Secondary | ICD-10-CM | POA: Diagnosis not present

## 2020-10-17 ENCOUNTER — Other Ambulatory Visit: Payer: Self-pay | Admitting: Family Medicine

## 2020-10-30 ENCOUNTER — Telehealth: Payer: Self-pay

## 2020-10-30 ENCOUNTER — Telehealth: Payer: Self-pay | Admitting: Family Medicine

## 2020-10-30 NOTE — Telephone Encounter (Signed)
Please advise 

## 2020-10-30 NOTE — Telephone Encounter (Signed)
Patient calling today to see if it ok for her to take Alka-Seltzer plus or regular due to having indigestion-she wants to know if this will interfere with any of her other medications-please advise

## 2020-10-30 NOTE — Telephone Encounter (Signed)
Patient notified.  Verbalized understanding. 

## 2020-10-30 NOTE — Telephone Encounter (Signed)
PT WILL CALL BACK. Due to schedule Medicare Annual Wellness Visit (AWV) either virtually OR in office.   Last AWV 09/21/19; please schedule at anytime with [facility]-Nurse Health Advisor at [facility].  This should be a 45 minute visit.

## 2020-10-30 NOTE — Telephone Encounter (Signed)
Its ok for her to take.  Katina Degree. [name], MD 10/30/2020 3:53 PM

## 2020-12-05 ENCOUNTER — Telehealth: Payer: Self-pay

## 2020-12-05 NOTE — Telephone Encounter (Signed)
.   LAST APPOINTMENT DATE: 10/30/2020   NEXT APPOINTMENT DATE:@Visit  date not found  MEDICATION:Ketorolac(toadol) 10 MG   PHARMACY:[id number] - [place], [address] AT [address]

## 2020-12-06 NOTE — Telephone Encounter (Signed)
Not on current med list, ok for fill?

## 2020-12-06 NOTE — Telephone Encounter (Signed)
This is typically something we only do for a few days. If she is having worsening pain recommend OV.

## 2020-12-07 NOTE — Telephone Encounter (Signed)
Called and lm for pt tcb. 

## 2020-12-07 NOTE — Telephone Encounter (Signed)
Called and spoke with pt and she stated she will call and schedule an appointment to discuss once she talks with her daughter.

## 2021-01-15 ENCOUNTER — Other Ambulatory Visit: Payer: Self-pay | Admitting: Family Medicine

## 2021-02-09 ENCOUNTER — Telehealth: Payer: Self-pay | Admitting: Family Medicine

## 2021-02-09 NOTE — Chronic Care Management (AMB) (Signed)
  Chronic Care Management   Outreach Note  02/09/2021 Name: KHADY VANDENBERG MRN: [id number] DOB: 1951/10/16  Referred by: Vivi Barrack, MD Reason for referral : No chief complaint on file.   An unsuccessful telephone outreach was attempted today. The patient was referred to the pharmacist for assistance with care management and care coordination.   Follow Up Plan:   Lauretta Grill Upstream Scheduler

## 2021-02-27 ENCOUNTER — Telehealth: Payer: Self-pay | Admitting: Family Medicine

## 2021-02-27 NOTE — Chronic Care Management (AMB) (Signed)
  Chronic Care Management   Outreach Note  02/27/2021 Name: [name] [name] MRN: [id number] DOB: Nov 27, 1950  Referred by: Vivi Barrack, MD Reason for referral : No chief complaint on file.   A second unsuccessful telephone outreach was attempted today. The patient was referred to pharmacist for assistance with care management and care coordination.  Follow Up Plan:   Lauretta Grill Upstream Scheduler

## 2021-02-28 ENCOUNTER — Emergency Department (HOSPITAL_COMMUNITY)
Admission: EM | Admit: 2021-02-28 | Discharge: 2021-02-28 | Disposition: A | Discharge status: Home / Self Care | Payer: Medicare HMO | Attending: Emergency Medicine | Admitting: Emergency Medicine

## 2021-02-28 ENCOUNTER — Emergency Department (HOSPITAL_COMMUNITY): Payer: Medicare HMO

## 2021-02-28 DIAGNOSIS — H81391 Other peripheral vertigo, right ear: Secondary | ICD-10-CM | POA: Diagnosis not present

## 2021-02-28 DIAGNOSIS — R531 Weakness: Secondary | ICD-10-CM | POA: Diagnosis not present

## 2021-02-28 DIAGNOSIS — I10 Essential (primary) hypertension: Secondary | ICD-10-CM | POA: Diagnosis not present

## 2021-02-28 DIAGNOSIS — Z79899 Other long term (current) drug therapy: Secondary | ICD-10-CM | POA: Insufficient documentation

## 2021-02-28 DIAGNOSIS — R404 Transient alteration of awareness: Secondary | ICD-10-CM | POA: Diagnosis not present

## 2021-02-28 DIAGNOSIS — R29810 Facial weakness: Secondary | ICD-10-CM | POA: Diagnosis not present

## 2021-02-28 DIAGNOSIS — G20 Parkinson's disease: Secondary | ICD-10-CM | POA: Insufficient documentation

## 2021-02-28 DIAGNOSIS — R42 Dizziness and giddiness: Secondary | ICD-10-CM | POA: Diagnosis not present

## 2021-02-28 DIAGNOSIS — Z743 Need for continuous supervision: Secondary | ICD-10-CM | POA: Diagnosis not present

## 2021-02-28 LAB — RAPID URINE DRUG SCREEN, HOSP PERFORMED
Amphetamines: NOT DETECTED
Barbiturates: NOT DETECTED
Benzodiazepines: NOT DETECTED
Cocaine: NOT DETECTED
Opiates: NOT DETECTED
Tetrahydrocannabinol: NOT DETECTED

## 2021-02-28 LAB — I-STAT CHEM 8, ED
BUN: 12 mg/dL (ref 8–23)
Calcium, Ion: 1.21 mmol/L (ref 1.15–1.40)
Chloride: 103 mmol/L (ref 98–111)
Creatinine, Ser: 0.6 mg/dL (ref 0.44–1.00)
Glucose, Bld: 88 mg/dL (ref 70–99)
HCT: 38 % (ref 36.0–46.0)
Hemoglobin: 12.9 g/dL (ref 12.0–15.0)
Potassium: 3.8 mmol/L (ref 3.5–5.1)
Sodium: 139 mmol/L (ref 135–145)
TCO2: 27 mmol/L (ref 22–32)

## 2021-02-28 LAB — CBC
HCT: 39.1 % (ref 36.0–46.0)
Hemoglobin: 12.6 g/dL (ref 12.0–15.0)
MCH: 29.4 pg (ref 26.0–34.0)
MCHC: 32.2 g/dL (ref 30.0–36.0)
MCV: 91.4 fL (ref 80.0–100.0)
Platelets: 137 10*3/uL — ABNORMAL LOW (ref 150–400)
RBC: 4.28 MIL/uL (ref 3.87–5.11)
RDW: 12.2 % (ref 11.5–15.5)
WBC: 8.9 10*3/uL (ref 4.0–10.5)
nRBC: 0 % (ref 0.0–0.2)

## 2021-02-28 LAB — DIFFERENTIAL
Abs Immature Granulocytes: 0.03 10*3/uL (ref 0.00–0.07)
Basophils Absolute: 0 10*3/uL (ref 0.0–0.1)
Basophils Relative: 0 %
Eosinophils Absolute: 0.1 10*3/uL (ref 0.0–0.5)
Eosinophils Relative: 1 %
Immature Granulocytes: 0 %
Lymphocytes Relative: 11 %
Lymphs Abs: 0.9 10*3/uL (ref 0.7–4.0)
Monocytes Absolute: 0.5 10*3/uL (ref 0.1–1.0)
Monocytes Relative: 6 %
Neutro Abs: 7.4 10*3/uL (ref 1.7–7.7)
Neutrophils Relative %: 82 %

## 2021-02-28 LAB — COMPREHENSIVE METABOLIC PANEL
ALT: 7 U/L (ref 0–44)
AST: 17 U/L (ref 15–41)
Albumin: 3.5 g/dL (ref 3.5–5.0)
Alkaline Phosphatase: 48 U/L (ref 38–126)
Anion gap: 7 (ref 5–15)
BUN: 13 mg/dL (ref 8–23)
CO2: 25 mmol/L (ref 22–32)
Calcium: 9 mg/dL (ref 8.9–10.3)
Chloride: 105 mmol/L (ref 98–111)
Creatinine, Ser: 0.61 mg/dL (ref 0.44–1.00)
GFR, Estimated: >60 mL/min (ref >60)
Glucose, Bld: 87 mg/dL (ref 70–99)
Potassium: 3.7 mmol/L (ref 3.5–5.1)
Sodium: 137 mmol/L (ref 135–145)
Total Bilirubin: 0.6 mg/dL (ref 0.3–1.2)
Total Protein: 6.2 g/dL — ABNORMAL LOW (ref 6.5–8.1)

## 2021-02-28 LAB — URINALYSIS, ROUTINE W REFLEX MICROSCOPIC
Bilirubin Urine: NEGATIVE
Glucose, UA: NEGATIVE mg/dL
Hgb urine dipstick: NEGATIVE
Ketones, ur: 5 mg/dL — AB
Nitrite: NEGATIVE
Protein, ur: NEGATIVE mg/dL
Specific Gravity, Urine: 1.01 (ref 1.005–1.030)
pH: 7 (ref 5.0–8.0)

## 2021-02-28 LAB — ETHANOL: Alcohol, Ethyl (B): <10 mg/dL (ref <10)

## 2021-02-28 LAB — APTT: aPTT: 30 seconds (ref 24–36)

## 2021-02-28 LAB — PROTIME-INR
INR: 1 (ref 0.8–1.2)
Prothrombin Time: 13.4 seconds (ref 11.4–15.2)

## 2021-02-28 MED ORDER — DOCUSATE SODIUM 100 MG PO CAPS: 100.0000 mg | ORAL_CAPSULE | Freq: Two times a day (BID) | ORAL | 0 refills | Status: DC

## 2021-02-28 MED ORDER — MECLIZINE HCL 25 MG PO TABS
25.0000 mg | ORAL_TABLET | Freq: Once | ORAL | Status: AC
  Administered 2021-02-28: 25 mg via ORAL
  Filled 2021-02-28: qty 1

## 2021-02-28 MED ORDER — MECLIZINE HCL 25 MG PO TABS: 25.0000 mg | ORAL_TABLET | Freq: Three times a day (TID) | ORAL | 0 refills | Status: DC | PRN

## 2021-02-28 NOTE — ED Triage Notes (Signed)
Pt arrived via [facility] from home. Pt reports dizziness upon waking at 0530 this morning. Pt went to bed approx 2200 last night which is when she last felt "normal." Pt caox4 and states she may have taken one extra citalopram pill last night by accident. EMS reports slurred speech but that daughter states that is her baseline, no other obvious neuro deficit. Pt denies any complaints other than dizziness.   EMS VS BP 142/70 HR 100  Spo2 100%  CBG 105  Afebrile   20 g IV R. Forearm

## 2021-02-28 NOTE — ED Notes (Addendum)
Pt to MRI

## 2021-02-28 NOTE — ED Provider Notes (Addendum)
[facility] EMERGENCY DEPARTMENT Provider Note   CSN: [id number] Arrival date & time: 02/28/21  0721     History Chief Complaint  Patient presents with  . Dizziness    [name] [name] is a 70 y.o. female.  HPI 70 year old female presents with acute dizziness.  Started this morning around 5:30 AM.  Last went to bed at 10:30 PM and that was her last normal period when she first woke up to go to the bathroom she felt acutely dizzy like things were spinning and going in a kaleidoscope.  There is no headache, vision changes or new weakness/numbness.  She chronically has some left-sided weakness from Parkinson's and chronically has some mild slurred speech.  No chest pain or recent illness.  No vomiting though a little bit of nausea.   Past Medical History:  Diagnosis Date  . Fibroid   . GERD (gastroesophageal reflux disease)   . Muscle weakness of lower extremity 01/17/2015  . Osteopenia   . Parkinson disease ([place]) 01/17/2015    Patient Active Problem List   Diagnosis Date Noted  . DDD (degenerative disc disease), cervical 06/18/2019  . Constipation 03/02/2019  . Seasonal allergies 03/02/2019  . Anxiety 05/07/2018  . Fatigue 04/20/2018  . Osteoarthritis 04/20/2018  . Palpitations 04/20/2018  . Cardiomegaly 04/20/2018  . Lesion of liver 04/20/2018  . Lung nodule 04/20/2018  . Muscle weakness of lower extremity 01/17/2015  . Parkinson disease ([place]) 01/17/2015  . Fibroid uterus 11/13/2012  . Benign colonic polyp 10/02/2012    Past Surgical History:  Procedure Laterality Date  . FINGER SURGERY    . MOUTH SURGERY    . TOE SURGERY    . TONSILLECTOMY    . TUBAL LIGATION     x2     OB History    Gravida  5   Para  5   Term  5   Preterm      AB      Living  5     SAB      IAB      Ectopic      Multiple      Live Births              Family History  Problem Relation Age of Onset  . Rheum arthritis Mother   . Breast cancer Mother         61's  . Cancer Father        throat  . Breast cancer Sister        62's  . Hypertension Brother   . Hyperlipidemia Brother   . Heart attack Son        Deceased, 80  . Healthy Daughter   . Parkinsonism Neg Hx     Social History   Tobacco Use  . Smoking status: Never Smoker  . Smokeless tobacco: Never Used  Vaping Use  . Vaping Use: Never used  Substance Use Topics  . Alcohol use: No    Alcohol/week: 0.0 standard drinks  . Drug use: No    Home Medications Prior to Admission medications   Medication Sig Start Date End Date Taking? Authorizing Provider  meclizine (ANTIVERT) 25 MG tablet Take 1 tablet (25 mg total) by mouth 3 (three) times daily as needed for dizziness. 02/28/21  Yes Sherwood Gambler, MD  Ascorbic Acid (VITAMIN C PO) Take 1 tablet by mouth daily.    [provider]  b complex vitamins capsule Take 1 capsule by mouth  daily.    [provider]  carbidopa-levodopa (SINEMET IR) 25-250 MG tablet TAKE 1 TABLET BY MOUTH THREE TIMES DAILY 01/15/21   Vivi Barrack, MD  Cholecalciferol (VITAMIN D) 2000 UNITS CAPS Take 2,000 Units by mouth daily.    [provider]  diazepam (VALIUM) 2 MG tablet Take 1 tablet (2 mg total) by mouth every 12 (twelve) hours as needed for anxiety. 09/17/18   Vivi Barrack, MD  Diclofenac Sodium (PENNSAID) 2 % SOLN Place 1-2 Pump onto the skin 4 (four) times daily as needed. 05/14/19   Vivi Barrack, MD  escitalopram (LEXAPRO) 20 MG tablet TAKE 1 TABLET BY MOUTH DAILY 08/03/20   Vivi Barrack, MD  gabapentin (NEURONTIN) 100 MG capsule Take 2 capsules (200 mg total) by mouth at bedtime. 06/17/19   Lyndal Pulley, DO  glucosamine-chondroitin 500-400 MG tablet Take 1 tablet by mouth daily.    [provider]  ibuprofen (ADVIL,MOTRIN) 200 MG tablet Take 400 mg by mouth every 6 (six) hours as needed for moderate pain.     [provider]  lubiprostone (AMITIZA) 8 MCG capsule Take 1 capsule (8 mcg  total) by mouth 2 (two) times daily with a meal. 03/10/20   Vivi Barrack, MD  Magnesium Hydroxide (MAGNESIA PO) Take 1 tablet by mouth daily.    [provider]  Multiple Vitamin (MULTIVITAMIN) capsule Take 1 capsule by mouth daily.    [provider]  Omega-3 Fatty Acids (OMEGA 3 PO) Take 1 tablet by mouth daily.    [provider]  tetrahydrozoline-zinc (VISINE-AC) 0.05-0.25 % ophthalmic solution Place 2 drops into both eyes 4 (four) times daily as needed (allergies).    [provider]  vitamin B-12 (CYANOCOBALAMIN) 1000 MCG tablet Take by mouth.    [provider]  amantadine (SYMMETREL) 100 MG capsule Take 1 capsule (100 mg total) by mouth 2 (two) times daily. Patient not taking: Reported on 09/21/2019 08/05/19 02/01/20  Vivi Barrack, MD    Allergies    Sulfa antibiotics  Review of Systems   Review of Systems  Constitutional: Negative for fever.  Eyes: Negative for visual disturbance.  Cardiovascular: Negative for chest pain.  Gastrointestinal: Positive for nausea. Negative for vomiting.  Neurological: Positive for dizziness. Negative for weakness, numbness and headaches.  All other systems reviewed and are negative.   Physical Exam Updated Vital Signs BP 120/78   Pulse 91   Temp 98.4 F (36.9 C) (Oral)   Resp 20   SpO2 100%   Physical Exam Vitals and nursing note reviewed.  Constitutional:      General: She is not in acute distress.    Appearance: She is well-developed. She is not ill-appearing or diaphoretic.  HENT:     Head: Normocephalic and atraumatic.     Right Ear: External ear normal.     Left Ear: External ear normal.     Nose: Nose normal.  Eyes:     General:        Right eye: No discharge.        Left eye: No discharge.     Extraocular Movements: Extraocular movements intact.     Right eye: No nystagmus.     Left eye: No nystagmus.     Pupils: Pupils are equal, round, and reactive to light.   Cardiovascular:     Rate and Rhythm: Normal rate and regular rhythm.     Heart sounds: Normal heart sounds.  Pulmonary:  Effort: Pulmonary effort is normal.     Breath sounds: Normal breath sounds.  Abdominal:     Palpations: Abdomen is soft.     Tenderness: There is no abdominal tenderness.  Skin:    General: Skin is warm and dry.  Neurological:     Mental Status: She is alert.     Comments: CN 3-12 grossly intact. 5/5 strength in all 4 extremities. Grossly normal sensation. Normal finger to nose.   Psychiatric:        Mood and Affect: Mood is not anxious.     ED Results / Procedures / Treatments   Labs (all labs ordered are listed, but only abnormal results are displayed) Labs Reviewed  CBC - Abnormal; Notable for the following components:      Result Value   Platelets 137 (*)    All other components within normal limits  COMPREHENSIVE METABOLIC PANEL - Abnormal; Notable for the following components:   Total Protein 6.2 (*)    All other components within normal limits  URINALYSIS, ROUTINE W REFLEX MICROSCOPIC - Abnormal; Notable for the following components:   Ketones, ur 5 (*)    Leukocytes,Ua LARGE (*)    Bacteria, UA RARE (*)    All other components within normal limits  ETHANOL  PROTIME-INR  APTT  DIFFERENTIAL  RAPID URINE DRUG SCREEN, HOSP PERFORMED  I-STAT CHEM 8, ED    EKG EKG Interpretation  Date/Time:  Wednesday Feb 28 2021 08:28:54 EDT Ventricular Rate:  90 PR Interval:    QRS Duration: 97 QT Interval:  354 QTC Calculation: 434 R Axis:   -10 Text Interpretation: Normal sinus rhythm Low voltage, precordial leads RSR' in V1 or V2, right VCD or RVH similar toApril 2021 Confirmed by Sherwood Gambler [phone]) on 02/28/2021 8:46:13 AM   Radiology MR BRAIN WO CONTRAST  Result Date: 02/28/2021 CLINICAL DATA:  Neuro deficit, acute, stroke suspected; dizziness, nonspecific. Additional history provided: Patient reports dizziness upon awakening at 5:30  a.m. this morning. EXAM: MRI HEAD WITHOUT CONTRAST TECHNIQUE: Multiplanar, multiecho pulse sequences of the brain and surrounding structures were obtained without intravenous contrast. COMPARISON:  Brain MRI 02/16/2015. FINDINGS: Brain: Cerebral volume is normal for age. Mild multifocal T2/FLAIR hyperintensity within the cerebral white matter is nonspecific, but compatible with chronic small vessel ischemic disease. There is no acute infarct. No evidence of intracranial mass. No chronic intracranial blood products. No extra-axial fluid collection. No midline shift. Vascular: Expected proximal arterial flow voids. Skull and upper cervical spine: Redemonstrated calvarial bony defects at the frontoparietal vertex compatible with prominent arachnoid granulations. No other focal marrow lesion. Sinuses/Orbits: Visualized orbits show no acute finding. Trace bilateral ethmoid sinus mucosal thickening. IMPRESSION: No evidence of acute intracranial abnormality. Mild cerebral white matter chronic small vessel ischemic disease, stable as compared to the brain MRI 02/16/2015. Electronically Signed   By: Kellie Simmering DO   On: 02/28/2021 11:25    Procedures Procedures   Medications Ordered in ED Medications  meclizine (ANTIVERT) tablet 25 mg (25 mg Oral Given 02/28/21 2585)    ED Course  I have reviewed the triage vital signs and the nursing notes.  Pertinent labs & imaging results that were available during my care of the patient were reviewed by me and considered in my medical decision making (see chart for details).    MDM Rules/Calculators/A&P                          Patient's symptoms  are mostly with movement.  Given age, MRI was obtained and shows no obvious stroke or other CNS pathology.  Labs are unremarkable.  Urine appears contaminated and she has no UTI symptoms.  She is able to ambulate and feels well enough for discharge after Antivert. Feels like it's worse going to the right. At this point I think  she is stable for discharge home to follow-up with PCP with return precautions. Is also asking for a stool softener Rx. Final Clinical Impression(s) / ED Diagnoses Final diagnoses:  Peripheral vertigo involving right ear    Rx / DC Orders ED Discharge Orders         Ordered    meclizine (ANTIVERT) 25 MG tablet  3 times daily PRN        02/28/21 1201           Sherwood Gambler, MD 02/28/21 1242    Sherwood Gambler, MD 02/28/21 1311

## 2021-03-02 ENCOUNTER — Ambulatory Visit (HOSPITAL_COMMUNITY)
Admission: EM | Admit: 2021-03-02 | Discharge: 2021-03-02 | Disposition: A | Discharge status: Home / Self Care | Payer: Medicare HMO | Attending: Medical Oncology | Admitting: Medical Oncology

## 2021-03-02 ENCOUNTER — Other Ambulatory Visit: Payer: Self-pay

## 2021-03-02 ENCOUNTER — Encounter (HOSPITAL_COMMUNITY): Payer: Self-pay

## 2021-03-02 DIAGNOSIS — H6121 Impacted cerumen, right ear: Secondary | ICD-10-CM

## 2021-03-02 DIAGNOSIS — R42 Dizziness and giddiness: Secondary | ICD-10-CM

## 2021-03-02 MED ORDER — CETIRIZINE HCL 0.24 % OP SOLN: 1.0000 [drp] | OPHTHALMIC | 0 refills | Status: AC

## 2021-03-02 MED ORDER — MECLIZINE HCL 12.5 MG PO TABS: 12.5000 mg | ORAL_TABLET | Freq: Three times a day (TID) | ORAL | 0 refills | Status: DC | PRN

## 2021-03-02 MED ORDER — FLUTICASONE PROPIONATE 50 MCG/ACT NA SUSP: 2.0000 | Freq: Every day | NASAL | 0 refills | Status: DC

## 2021-03-02 NOTE — ED Triage Notes (Signed)
Pt in with c/o dizziness when she stands up that has been going on for 4 days now  Pt states she thinks she has an ear infection and she can't lay on her right ear  Pt was seen in the ED and had blood tests and MRI and urinalysis  Pt states her ears were not checked

## 2021-03-02 NOTE — ED Provider Notes (Addendum)
[facility]    CSN: [id number] Arrival date & time: 03/02/21  1410      History   Chief Complaint Chief Complaint  Patient presents with  . Dizziness    HPI [name] [name] is a 70 y.o. female.   HPI   Dizziness: Patient presents with her daughter.  For the past 4 days she has had dizziness.  When the dizziness first started she was taken to the ER as she was having room spinning vertigo and was very uncomfortable.  She did not have any abnormalities that looked acute on her MRI or blood tests.  She did have a urine that appeared contaminated and she denied any UTI symptoms.  Patient and her mother state that her dizziness has improved and she is having less of the overall vertigo room spinning sensation but when she turns her head to the right she will still get some dizziness.  She questions if she might have an ear infection that is causing symptoms as she recalls that her ears were not assessed in the emergency room to her knowledge.  She denies any new slurred speech, weakness or any worsening symptoms.  Past Medical History:  Diagnosis Date  . Fibroid   . GERD (gastroesophageal reflux disease)   . Muscle weakness of lower extremity 01/17/2015  . Osteopenia   . Parkinson disease ([place]) 01/17/2015    Patient Active Problem List   Diagnosis Date Noted  . DDD (degenerative disc disease), cervical 06/18/2019  . Constipation 03/02/2019  . Seasonal allergies 03/02/2019  . Anxiety 05/07/2018  . Fatigue 04/20/2018  . Osteoarthritis 04/20/2018  . Palpitations 04/20/2018  . Cardiomegaly 04/20/2018  . Lesion of liver 04/20/2018  . Lung nodule 04/20/2018  . Muscle weakness of lower extremity 01/17/2015  . Parkinson disease ([place]) 01/17/2015  . Fibroid uterus 11/13/2012  . Benign colonic polyp 10/02/2012    Past Surgical History:  Procedure Laterality Date  . FINGER SURGERY    . MOUTH SURGERY    . TOE SURGERY    . TONSILLECTOMY    . TUBAL LIGATION     x2     OB History    Gravida  5   Para  5   Term  5   Preterm      AB      Living  5     SAB      IAB      Ectopic      Multiple      Live Births               Home Medications    Prior to Admission medications   Medication Sig Start Date End Date Taking? Authorizing Provider  Ascorbic Acid (VITAMIN C PO) Take 1 tablet by mouth daily.    [provider]  b complex vitamins capsule Take 1 capsule by mouth daily.    [provider]  carbidopa-levodopa (SINEMET IR) 25-250 MG tablet TAKE 1 TABLET BY MOUTH THREE TIMES DAILY 01/15/21   Vivi Barrack, MD  Cholecalciferol (VITAMIN D) 2000 UNITS CAPS Take 2,000 Units by mouth daily.    [provider]  diazepam (VALIUM) 2 MG tablet Take 1 tablet (2 mg total) by mouth every 12 (twelve) hours as needed for anxiety. 09/17/18   Vivi Barrack, MD  Diclofenac Sodium (PENNSAID) 2 % SOLN Place 1-2 Pump onto the skin 4 (four) times daily as needed. 05/14/19   Vivi Barrack, MD  docusate  sodium (COLACE) 100 MG capsule Take 1 capsule (100 mg total) by mouth every 12 (twelve) hours. 02/28/21   Sherwood Gambler, MD  escitalopram (LEXAPRO) 20 MG tablet TAKE 1 TABLET BY MOUTH DAILY 08/03/20   Vivi Barrack, MD  gabapentin (NEURONTIN) 100 MG capsule Take 2 capsules (200 mg total) by mouth at bedtime. 06/17/19   Lyndal Pulley, DO  glucosamine-chondroitin 500-400 MG tablet Take 1 tablet by mouth daily.    [provider]  ibuprofen (ADVIL,MOTRIN) 200 MG tablet Take 400 mg by mouth every 6 (six) hours as needed for moderate pain.     [provider]  lubiprostone (AMITIZA) 8 MCG capsule Take 1 capsule (8 mcg total) by mouth 2 (two) times daily with a meal. 03/10/20   Vivi Barrack, MD  Magnesium Hydroxide (MAGNESIA PO) Take 1 tablet by mouth daily.    [provider]  meclizine (ANTIVERT) 25 MG tablet Take 1 tablet (25 mg total) by mouth 3 (three) times daily as needed for dizziness. 02/28/21    Sherwood Gambler, MD  Multiple Vitamin (MULTIVITAMIN) capsule Take 1 capsule by mouth daily.    [provider]  Omega-3 Fatty Acids (OMEGA 3 PO) Take 1 tablet by mouth daily.    [provider]  tetrahydrozoline-zinc (VISINE-AC) 0.05-0.25 % ophthalmic solution Place 2 drops into both eyes 4 (four) times daily as needed (allergies).    [provider]  vitamin B-12 (CYANOCOBALAMIN) 1000 MCG tablet Take by mouth.    [provider]  amantadine (SYMMETREL) 100 MG capsule Take 1 capsule (100 mg total) by mouth 2 (two) times daily. Patient not taking: Reported on 09/21/2019 08/05/19 02/01/20  Vivi Barrack, MD    Family History Family History  Problem Relation Age of Onset  . Rheum arthritis Mother   . Breast cancer Mother        8's  . Cancer Father        throat  . Breast cancer Sister        42's  . Hypertension Brother   . Hyperlipidemia Brother   . Heart attack Son        Deceased, 1  . Healthy Daughter   . Parkinsonism Neg Hx     Social History Social History   Tobacco Use  . Smoking status: Never Smoker  . Smokeless tobacco: Never Used  Vaping Use  . Vaping Use: Never used  Substance Use Topics  . Alcohol use: No    Alcohol/week: 0.0 standard drinks  . Drug use: No     Allergies   Sulfa antibiotics   Review of Systems Review of Systems  As stated above in HPI Physical Exam Triage Vital Signs ED Triage Vitals  Enc Vitals Group     BP 03/02/21 1505 122/88     Pulse Rate 03/02/21 1505 90     Resp 03/02/21 1505 18     Temp 03/02/21 1505 98.4 F (36.9 C)     Temp src --      SpO2 03/02/21 1505 100 %     Weight --      Height --      Head Circumference --      Peak Flow --      Pain Score 03/02/21 1504 0     Pain Loc --      Pain Edu? --      Excl. in [place]? --    No data found.  Updated Vital Signs BP 122/88  Pulse 90   Temp 98.4 F (36.9 C)   Resp 18   SpO2 100%   Physical Exam Vitals and nursing note  reviewed.  Constitutional:      General: She is not in acute distress.    Appearance: Normal appearance. She is not ill-appearing, toxic-appearing or diaphoretic.     Comments: In her normal wheelchair  HENT:     Head: Normocephalic and atraumatic.     Right Ear: There is impacted cerumen.     Left Ear: Tympanic membrane, ear canal and external ear normal.     Nose: Congestion present.     Mouth/Throat:     Pharynx: No oropharyngeal exudate or posterior oropharyngeal erythema.  Eyes:     Extraocular Movements: Extraocular movements intact.     Pupils: Pupils are equal, round, and reactive to light.     Comments: Mild right sided nystagmus   Neck:     Vascular: No carotid bruit.  Cardiovascular:     Rate and Rhythm: Normal rate and regular rhythm.     Heart sounds: Normal heart sounds.  Pulmonary:     Breath sounds: Normal breath sounds.  Musculoskeletal:     Cervical back: Normal range of motion and neck supple. No rigidity.  Lymphadenopathy:     Cervical: No cervical adenopathy.  Neurological:     Mental Status: She is alert and oriented to person, place, and time. Mental status is at baseline.  Psychiatric:        Mood and Affect: Mood normal.        Behavior: Behavior normal.        Thought Content: Thought content normal.        Judgment: Judgment normal.      UC Treatments / Results  Labs (all labs ordered are listed, but only abnormal results are displayed) Labs Reviewed - No data to display  EKG   Radiology No results found.  Procedures Procedures (including critical care time)  Medications Ordered in UC Medications - No data to display  Initial Impression / Assessment and Plan / UC Course  I have reviewed the triage vital signs and the nursing notes.  Pertinent labs & imaging results that were available during my care of the patient were reviewed by me and considered in my medical decision making (see chart for details).     New. Improved with ear  lavage. Sending in flonase to help with symptoms and discussed vertigo. Discussed red flag signs and symptoms. They also ask for a lower dose of her meclinzine and allergy eye drops Final Clinical Impressions(s) / UC Diagnoses   Final diagnoses:  None   Discharge Instructions   None    ED Prescriptions    None     PDMP not reviewed this encounter.   Hughie Closs, PA-C 03/02/21 1621    Hughie Closs, PA-C 03/02/21 1626

## 2021-03-06 ENCOUNTER — Ambulatory Visit: Payer: Medicare HMO | Admitting: Family

## 2021-03-08 ENCOUNTER — Ambulatory Visit: Payer: Medicare HMO | Admitting: Family

## 2021-03-09 ENCOUNTER — Other Ambulatory Visit: Payer: Self-pay

## 2021-03-09 ENCOUNTER — Ambulatory Visit (INDEPENDENT_AMBULATORY_CARE_PROVIDER_SITE_OTHER): Payer: Medicare HMO | Admitting: Family Medicine

## 2021-03-09 ENCOUNTER — Ambulatory Visit: Payer: Medicare HMO | Admitting: Family Medicine

## 2021-03-09 VITALS — BP 108/74 | HR 89 | Temp 97.7°F | Ht 67.0 in | Wt 121.0 lb

## 2021-03-09 DIAGNOSIS — G20 Parkinson's disease: Secondary | ICD-10-CM | POA: Diagnosis not present

## 2021-03-09 DIAGNOSIS — R42 Dizziness and giddiness: Secondary | ICD-10-CM | POA: Diagnosis not present

## 2021-03-09 DIAGNOSIS — K5900 Constipation, unspecified: Secondary | ICD-10-CM

## 2021-03-09 MED ORDER — DIAZEPAM 2 MG PO TABS: 1.0000 mg | ORAL_TABLET | Freq: Two times a day (BID) | ORAL | 0 refills | Status: DC | PRN

## 2021-03-09 NOTE — Progress Notes (Signed)
   [name] [name] is a 70 y.o. female who presents today for an office visit.  Assessment/Plan:  New/Acute Problems: Vertigo No red flags.  She had MRI which was negative.  Likely has BPPV.  She has had some significant anticholinergic effects with the meclizine.  We will stop this and start a low-dose Valium.  She has been on Valium in the past and has done well with this.  We will place referral to vestibular rehab.  If symptoms persist would consider referral to ENT.  Chronic Problems Addressed Today: Constipation Worsened since starting meclizine.  Hopefully she will have some improvement now that we are switching off.  She will continue Colace and MiraLAX.    Parkinson disease Stable.  Managed by neurology.  Reassured patient that her vertigo was likely not a progression of her Parkinson's.     Subjective:  HPI:  Patient here for ED follow-up.  Was seen in the ED about a week and a half ago due to vertigo.  Had stroke work-up which was negative.  She was started on meclizine. Symptoms have been modestly improving over the last week or so.  She has had worsening constipation since starting on the meclizine.  Still has persistent vertigo.  Worse with certain head motions.  Feels like her head is turning to the right.  She went to the urgent care a few days after ED visit.  She was noted to have cerumen impaction and had ear irrigation.  She has not had any hearing issues.  No reported tinnitus.  No reported weakness or numbness beyond her baseline due to Parkinson's disease.       Objective:  Physical Exam: BP 108/74   Pulse 89   Temp 97.7 F (36.5 C) (Temporal)   Ht 5\' 7"  (1.702 m)   Wt 121 lb (54.9 kg)   SpO2 100%   BMI 18.95 kg/m   Gen: No acute distress, resting comfortably HEENT: Bilateral TMs clear. CV: Regular rate and rhythm with no murmurs appreciated Pulm: Normal work of breathing, clear to auscultation bilaterally with no crackles, wheezes, or rhonchi Neuro:  Grossly normal, moves all extremities Psych: Normal affect and thought content  Time Spent: 45 minutes of total time was spent on the date of the encounter performing the following actions: chart review prior to seeing the patient including recent ED visit, obtaining history, performing a medically necessary exam, counseling on the treatment plan, placing orders, and documenting in our EHR.        Algis Greenhouse. Jerline Pain, MD 03/09/2021 10:06 AM

## 2021-03-09 NOTE — Assessment & Plan Note (Signed)
Worsened since starting meclizine.  Hopefully she will have some improvement now that we are switching off.  She will continue Colace and MiraLAX.

## 2021-03-09 NOTE — Patient Instructions (Addendum)
It was very nice to see you today!  Please stop the meclizine.  Start low-dose Valium.  We will place referral for you to see physical therapy.  I will see you back soon for annual physical.  Take care, Dr Jerline Pain  PLEASE NOTE:  If you had any lab tests please let us know if you have not heard back within a few days. You may see your results on mychart before we have a chance to review them but we will give you a call once they are reviewed by [place]. If we ordered any referrals today, please let us know if you have not heard from their office within the next week.   Please try these tips to maintain a healthy lifestyle:   Eat at least 3 REAL meals and 1-2 snacks per day.  Aim for no more than 5 hours between eating.  If you eat breakfast, please do so within one hour of getting up.    Each meal should contain half fruits/vegetables, one quarter protein, and one quarter carbs (no bigger than a computer mouse)   Cut down on sweet beverages. This includes juice, soda, and sweet tea.     Drink at least 1 glass of water with each meal and aim for at least 8 glasses per day   Exercise at least 150 minutes every week.

## 2021-03-09 NOTE — Assessment & Plan Note (Signed)
Stable.  Managed by neurology.  Reassured patient that her vertigo was likely not a progression of her Parkinson's.

## 2021-03-12 ENCOUNTER — Other Ambulatory Visit: Payer: Self-pay | Admitting: Family Medicine

## 2021-03-12 DIAGNOSIS — R42 Dizziness and giddiness: Secondary | ICD-10-CM

## 2021-03-15 ENCOUNTER — Ambulatory Visit: Payer: Medicare HMO | Admitting: Family Medicine

## 2021-03-19 ENCOUNTER — Telehealth: Payer: Self-pay

## 2021-03-19 NOTE — Telephone Encounter (Signed)
Estill Bamberg from [facility] was just calling to let Dr. Jerline Pain know the start of care for the patient would be 03/21/21.

## 2021-03-19 NOTE — Telephone Encounter (Signed)
FYI

## 2021-03-21 ENCOUNTER — Telehealth: Payer: Self-pay

## 2021-03-21 DIAGNOSIS — F419 Anxiety disorder, unspecified: Secondary | ICD-10-CM | POA: Diagnosis not present

## 2021-03-21 DIAGNOSIS — G20 Parkinson's disease: Secondary | ICD-10-CM | POA: Diagnosis not present

## 2021-03-21 DIAGNOSIS — K5900 Constipation, unspecified: Secondary | ICD-10-CM | POA: Diagnosis not present

## 2021-03-21 NOTE — Telephone Encounter (Signed)
VO given  OT/PT/Skilled nursing Social Work?Speech PT

## 2021-03-21 NOTE — Telephone Encounter (Signed)
..  Home Health verbal orders-caller/Agency:  [place] number: [phone]/ [place] OT/PT/Skilled nursing/Social Work/Speech: PT    Reason:  Frequency:1Week 1 2 week 4  1 week 2

## 2021-03-23 NOTE — Telephone Encounter (Signed)
Ok with me. Please place any necessary orders. 

## 2021-03-27 DIAGNOSIS — G20 Parkinson's disease: Secondary | ICD-10-CM | POA: Diagnosis not present

## 2021-03-27 DIAGNOSIS — F419 Anxiety disorder, unspecified: Secondary | ICD-10-CM | POA: Diagnosis not present

## 2021-03-27 DIAGNOSIS — K5900 Constipation, unspecified: Secondary | ICD-10-CM | POA: Diagnosis not present

## 2021-03-29 DIAGNOSIS — F419 Anxiety disorder, unspecified: Secondary | ICD-10-CM | POA: Diagnosis not present

## 2021-03-29 DIAGNOSIS — G20 Parkinson's disease: Secondary | ICD-10-CM | POA: Diagnosis not present

## 2021-03-29 DIAGNOSIS — K5900 Constipation, unspecified: Secondary | ICD-10-CM | POA: Diagnosis not present

## 2021-03-30 ENCOUNTER — Telehealth: Payer: Self-pay | Admitting: Family Medicine

## 2021-03-30 DIAGNOSIS — K5900 Constipation, unspecified: Secondary | ICD-10-CM | POA: Diagnosis not present

## 2021-03-30 DIAGNOSIS — G20 Parkinson's disease: Secondary | ICD-10-CM | POA: Diagnosis not present

## 2021-03-30 DIAGNOSIS — F419 Anxiety disorder, unspecified: Secondary | ICD-10-CM | POA: Diagnosis not present

## 2021-03-30 NOTE — Chronic Care Management (AMB) (Signed)
  Chronic Care Management   Note  03/30/2021 Name: [name] [name] MRN: [id number] DOB: May 06, 1951  [name] [name] is a 70 y.o. year old female who is a primary care patient of Vivi Barrack, MD. I reached out to [name] [name] by phone today in response to a referral sent by Ms. Estill Bamberg [place]'s PCP, Vivi Barrack, MD.   [name] [name] was given information about Chronic Care Management services today including:  1. CCM service includes personalized support from designated clinical staff supervised by her physician, including individualized plan of care and coordination with other care providers 2. 24/7 contact phone numbers for assistance for urgent and routine care needs. 3. Service will only be billed when office clinical staff spend 20 minutes or more in a month to coordinate care. 4. Only one practitioner may furnish and bill the service in a calendar month. 5. The patient may stop CCM services at any time (effective at the end of the month) by phone call to the office staff.   Patient agreed to services and verbal consent obtained.   Follow up plan:   [name] [name] [employer] Scheduler

## 2021-04-03 DIAGNOSIS — G20 Parkinson's disease: Secondary | ICD-10-CM | POA: Diagnosis not present

## 2021-04-03 DIAGNOSIS — F419 Anxiety disorder, unspecified: Secondary | ICD-10-CM | POA: Diagnosis not present

## 2021-04-03 DIAGNOSIS — K5900 Constipation, unspecified: Secondary | ICD-10-CM | POA: Diagnosis not present

## 2021-04-05 DIAGNOSIS — K5900 Constipation, unspecified: Secondary | ICD-10-CM | POA: Diagnosis not present

## 2021-04-05 DIAGNOSIS — G20 Parkinson's disease: Secondary | ICD-10-CM | POA: Diagnosis not present

## 2021-04-05 DIAGNOSIS — F419 Anxiety disorder, unspecified: Secondary | ICD-10-CM | POA: Diagnosis not present

## 2021-04-15 ENCOUNTER — Other Ambulatory Visit: Payer: Self-pay | Admitting: Family Medicine

## 2021-04-18 DIAGNOSIS — K5900 Constipation, unspecified: Secondary | ICD-10-CM | POA: Diagnosis not present

## 2021-04-18 DIAGNOSIS — F419 Anxiety disorder, unspecified: Secondary | ICD-10-CM | POA: Diagnosis not present

## 2021-04-18 DIAGNOSIS — G20 Parkinson's disease: Secondary | ICD-10-CM | POA: Diagnosis not present

## 2021-04-20 DIAGNOSIS — G20 Parkinson's disease: Secondary | ICD-10-CM | POA: Diagnosis not present

## 2021-04-20 DIAGNOSIS — F419 Anxiety disorder, unspecified: Secondary | ICD-10-CM | POA: Diagnosis not present

## 2021-04-20 DIAGNOSIS — K5900 Constipation, unspecified: Secondary | ICD-10-CM | POA: Diagnosis not present

## 2021-04-25 DIAGNOSIS — K5900 Constipation, unspecified: Secondary | ICD-10-CM | POA: Diagnosis not present

## 2021-04-25 DIAGNOSIS — F419 Anxiety disorder, unspecified: Secondary | ICD-10-CM | POA: Diagnosis not present

## 2021-04-25 DIAGNOSIS — G20 Parkinson's disease: Secondary | ICD-10-CM | POA: Diagnosis not present

## 2021-05-01 ENCOUNTER — Telehealth: Payer: Medicare HMO

## 2021-05-03 DIAGNOSIS — G20 Parkinson's disease: Secondary | ICD-10-CM | POA: Diagnosis not present

## 2021-05-03 DIAGNOSIS — K5900 Constipation, unspecified: Secondary | ICD-10-CM | POA: Diagnosis not present

## 2021-05-03 DIAGNOSIS — F419 Anxiety disorder, unspecified: Secondary | ICD-10-CM | POA: Diagnosis not present

## 2021-05-08 DIAGNOSIS — G20 Parkinson's disease: Secondary | ICD-10-CM | POA: Diagnosis not present

## 2021-05-08 DIAGNOSIS — F419 Anxiety disorder, unspecified: Secondary | ICD-10-CM | POA: Diagnosis not present

## 2021-05-08 DIAGNOSIS — K5900 Constipation, unspecified: Secondary | ICD-10-CM | POA: Diagnosis not present

## 2021-05-17 DIAGNOSIS — G20 Parkinson's disease: Secondary | ICD-10-CM | POA: Diagnosis not present

## 2021-05-17 DIAGNOSIS — F419 Anxiety disorder, unspecified: Secondary | ICD-10-CM | POA: Diagnosis not present

## 2021-05-17 DIAGNOSIS — K5900 Constipation, unspecified: Secondary | ICD-10-CM | POA: Diagnosis not present

## 2021-06-07 ENCOUNTER — Telehealth: Payer: Self-pay | Admitting: Family Medicine

## 2021-06-07 NOTE — Telephone Encounter (Signed)
Called patient to schedule Annual Wellness Visit.  Please schedule with Nurse Health Advisor Charlott Rakes, RN at [facility].  Please call [phone] ask for Junius Creamer with patient she state she need to contact her daughter before scheduling AWV she will call me back.

## 2021-06-15 ENCOUNTER — Telehealth: Payer: Self-pay | Admitting: Pharmacist

## 2021-06-15 NOTE — Chronic Care Management (AMB) (Addendum)
Chronic Care Management Pharmacy Assistant   Name: [name] [name]  MRN: [id number] DOB: June 03, 1951  [name] [name] is an 70 y.o. year old female who presents for his initial CCM visit with the clinical pharmacist.  Reason for Encounter: Chart Review For Initial Visit With Clinical Pharmacist   Conditions to be addressed/monitored: Cardiomegaly, OA, DDD, Palpitations, Anxiety, Seasonal Allergies, Parkinson Disease  Primary concerns for visit include: Parkinson Disease, OA, DDD, Palpitations, Anxiety, Seasonal Allergies   Recent office visits:  04/18/2021 OV Parkinson's disease, no further information available.  04/05/2021 OV Parkinson's disease, no further information available.  04/03/2021 OV Parkinson's disease, no further information available.  03/29/2021 OV Parkinson's disease, no further information available.  03/27/2021 OV Parkinson's disease, no further information available.  03/21/2021 OV Parkinson's disease, no further information available.  03/09/2021 OV (PCP) Vivi Barrack, MD; stop meclizine, start low-dose Valium.  03/02/2021 OV (urgent care) Dizziness, no medication changes indicated.  Recent consult visits:  None  Hospital visits:  02/28/2021 ED Visit, Sherwood Gambler, MD; Dizziness, Rx Meclizine 25 mg TID PRN  Medications: Outpatient Encounter Medications as of 06/15/2021  Medication Sig   Ascorbic Acid (VITAMIN C PO) Take 1 tablet by mouth daily.   b complex vitamins capsule Take 1 capsule by mouth daily.   carbidopa-levodopa (SINEMET IR) 25-250 MG tablet TAKE 1 TABLET BY MOUTH THREE TIMES DAILY   Cholecalciferol (VITAMIN D) 2000 UNITS CAPS Take 2,000 Units by mouth daily.   diazepam (VALIUM) 2 MG tablet Take 0.5 tablets (1 mg total) by mouth every 12 (twelve) hours as needed (vertigo).   Diclofenac Sodium (PENNSAID) 2 % SOLN Place 1-2 Pump onto the skin 4 (four) times daily as needed.   docusate sodium (COLACE) 100 MG capsule Take 1 capsule  (100 mg total) by mouth every 12 (twelve) hours.   escitalopram (LEXAPRO) 20 MG tablet TAKE 1 TABLET BY MOUTH DAILY   fluticasone (FLONASE) 50 MCG/ACT nasal spray Place 2 sprays into both nostrils daily.   glucosamine-chondroitin 500-400 MG tablet Take 1 tablet by mouth daily.   ibuprofen (ADVIL,MOTRIN) 200 MG tablet Take 400 mg by mouth every 6 (six) hours as needed for moderate pain.    lubiprostone (AMITIZA) 8 MCG capsule Take 1 capsule (8 mcg total) by mouth 2 (two) times daily with a meal.   Magnesium Hydroxide (MAGNESIA PO) Take 1 tablet by mouth daily.   Multiple Vitamin (MULTIVITAMIN) capsule Take 1 capsule by mouth daily.   Omega-3 Fatty Acids (OMEGA 3 PO) Take 1 tablet by mouth daily.   tetrahydrozoline-zinc (VISINE-AC) 0.05-0.25 % ophthalmic solution Place 2 drops into both eyes 4 (four) times daily as needed (allergies).   vitamin B-12 (CYANOCOBALAMIN) 1000 MCG tablet Take by mouth.   [DISCONTINUED] amantadine (SYMMETREL) 100 MG capsule Take 1 capsule (100 mg total) by mouth 2 (two) times daily. (Patient not taking: Reported on 09/21/2019)   No facility-administered encounter medications on file as of 06/15/2021.    Current Medications: Carbidopa-levodopa 25-250 mg last filled 04/16/2021 90 DS Diazepam 2 mg last filled 03/09/2021 30 DS Flonase last filled 03/02/2021 Docusate sodium 100 mg - per patient no longer taking Escitalopram 20 mg last filled 04/15/2021 90 DS Amitiza 8 mcg -per patient no longer taking Diclofenac sodium 2% solution 05/19/2019 - uses as needed Vitamin B-12 1000 mcg daily B Complex - per patient she is out, will buy more Visine AC Vitamin D 1000 mcg daily Ibuprofen 100 mg as needed for pain Omega-3 Fatty Acids once  a day Glucosamine-chondroitin 500-400 takes as needed  Magnesium PO once a day Vitamin C once a day Multiple Vitamin once a day  Patient Questions: Any changes in your medications or health? Patient states she has not had any changes in  her medications or health.  Any side effects from any medications?  Patient states she has not had any side effects from any of her medications that she is aware of.  Do you have any symptoms or problems not managed by your medications? Patient states she does not have any symptoms or problems that are not currently managed by her medications  Any concerns about your health right now? Patient states she does not have any concerns about her health right now.  Has your provider asked that you check blood pressure, blood sugar, or follow special diet at home? Patient states she does not check her blood pressure or blood sugar. She states she tries to eat healthy.  Do you get any type of exercise on a regular basis? Patient states she like to walks and exercise in the house.  Can you think of a goal you would like to reach for your health? Patient states she would like to walk longer and stronger.  Do you have any problems getting your medications? Patient states she doesn't have any problems getting her medications. She states a family member picks up her medications for her.  Is there anything that you would like to discuss during the appointment?  Patient states she would like to discuss her medications.  Please bring medications and supplements to appointment.   Future Appointments  Date Time Provider [place]  06/19/2021  2:00 PM [facility] CCM PHARMACIST [facility]  09/11/2021  2:40 PM Vivi Barrack, MD [facility]    Star Rating Drugs: None  April D Calhoun, [place] Pharmacist Assistant [phone]

## 2021-06-19 ENCOUNTER — Ambulatory Visit (INDEPENDENT_AMBULATORY_CARE_PROVIDER_SITE_OTHER): Payer: Medicare HMO | Admitting: Pharmacist

## 2021-06-19 DIAGNOSIS — M8949 Other hypertrophic osteoarthropathy, multiple sites: Secondary | ICD-10-CM | POA: Diagnosis not present

## 2021-06-19 DIAGNOSIS — F419 Anxiety disorder, unspecified: Secondary | ICD-10-CM

## 2021-06-19 DIAGNOSIS — G20 Parkinson's disease: Secondary | ICD-10-CM | POA: Diagnosis not present

## 2021-06-19 DIAGNOSIS — M159 Polyosteoarthritis, unspecified: Secondary | ICD-10-CM

## 2021-06-19 DIAGNOSIS — K5900 Constipation, unspecified: Secondary | ICD-10-CM | POA: Diagnosis not present

## 2021-06-19 DIAGNOSIS — J302 Other seasonal allergic rhinitis: Secondary | ICD-10-CM

## 2021-06-19 NOTE — Patient Instructions (Addendum)
Visit Information   Goals Addressed             This Visit's Progress    Manage My Medicine       Timeframe:  Long-Range Goal Priority:  High Start Date:    06/19/21                         Expected End Date:   12/20/21                    Follow Up Date 09/26/21    - call for medicine refill 2 or 3 days before it runs out - keep a list of all the medicines I take; vitamins and herbals too - use a pillbox to sort medicine    Why is this important?   These steps will help you keep on track with your medicines.   Notes:        Patient Care Plan: General Pharmacy (Adult)     Problem Identified: Parkinson's Disease, Anxiety, Constipation, Allergies   Priority: High  Onset Date: 06/19/2021     Long-Range Goal: Patient-Specific Goal   Start Date: 06/19/2021  Expected End Date: 12/20/2021  This Visit's Progress: On track  Priority: High  Note:   Current Barriers:  Cannot pick up her own medications  Pharmacist Clinical Goal(s):  Patient will work to make medication delivery and pickup easier through collaboration with PharmD and provider.   Interventions: 1:1 collaboration with Vivi Barrack, MD regarding development and update of comprehensive plan of care as evidenced by provider attestation and co-signature Inter-disciplinary care team collaboration (see longitudinal plan of care) Comprehensive medication review performed; medication list updated in electronic medical record  Depression/Anxiety (Goal: Minimize symptoms) -Controlled -Current treatment: Escitalopram '20mg'$  daily Valium '2mg'$  - one half tablet daily prn -Medications previously tried/failed: citalopram -PHQ9:  [place] Office Visit from 09/17/2018 in [facility]  PHQ-9 Total Score 5      -GAD7:  GAD 7 : Generalized Anxiety Score 09/17/2018 05/07/2018  Nervous, Anxious, on Edge 1 3  Control/stop worrying 0 1  Worry too much - different things 0 0  Trouble relaxing 1  3  Restless 0 1  Easily annoyed or irritable 0 0  Afraid - awful might happen 0 0  Total GAD 7 Score 2 8  Anxiety Difficulty Somewhat difficult Very difficult  -Educated on Benefits of medication for symptom control -Patient taking medication at night and having a difficult time sleeping longer than 4-5 hours. -Recommended to continue current medication Trial taking medication in the morning to see if this helps with sleep concern.  Parkinson's Disease (Goal: Control symptoms) -Controlled -Current treatment  Carbidopa-levodopa 25-250 mg (patient only taking twice daily) -Medications previously tried: none noted -Only takes twice daily because it makes her sleepy  -She is able to take care of herself, perform ADL's still -Recommended to continue current medication  Constipation (Goal: Regular bowel movements) -Controlled -Current treatment  Colace '100mg'$  prn -Medications previously tried: Amitiza (too strong) -Bowels are normal at this time  -Recommended to continue current medication  Allergies (Goal: Minimize symptoms) -Controlled -Current treatment  Fluticasone 22mg prn -Medications previously tried: none noted -Does not have to use very often, allergy symptoms have been controlled lately  -Recommended to continue current medication  Patient Goals/Self-Care Activities Patient will:  - take medications as prescribed focus on medication adherence by pill counts target a minimum of 150 minutes of moderate  intensity exercise weekly  Follow Up Plan: The care management team will reach out to the patient again over the next 180 days.        Ms. Deline was given information about Chronic Care Management services today including:  CCM service includes personalized support from designated clinical staff supervised by her physician, including individualized plan of care and coordination with other care providers 24/7 contact phone numbers for assistance for urgent and  routine care needs. Standard insurance, coinsurance, copays and deductibles apply for chronic care management only during months in which we provide at least 20 minutes of these services. Most insurances cover these services at 100%, however patients may be responsible for any copay, coinsurance and/or deductible if applicable. This service may help you avoid the need for more expensive face-to-face services. Only one practitioner may furnish and bill the service in a calendar month. The patient may stop CCM services at any time (effective at the end of the month) by phone call to the office staff.  Patient agreed to services and verbal consent obtained.   The patient verbalized understanding of instructions, educational materials, and care plan provided today and agreed to receive a mailed copy of patient instructions, educational materials, and care plan.  Telephone follow up appointment with pharmacy team member scheduled for: 6 months  [name] [name], [place]

## 2021-06-19 NOTE — Progress Notes (Signed)
Chronic Care Management Pharmacy Note  06/19/2021 Name:  [name] [name] MRN:  [id number] DOB:  Jan 10, 1951  Summary: Initial visit with PharmD.  Patient is doing well overall, not using Valium very much and reports mood is stable.  All meds reviewed updated.  She is no longer taking Amitiza and is only using her Sinemet twice daily (noon and hs) due to it making her tired.  Recommendations/Changes made from today's visit: Update rx for Sinemet to reflect current use  Plan: FU 6 months   Subjective: [name] [name] is an 70 y.o. year old female who is a primary patient of Vivi Barrack, MD.  The CCM team was consulted for assistance with disease management and care coordination needs.    Engaged with patient by telephone for initial visit in response to provider referral for pharmacy case management and/or care coordination services.   Consent to Services:  The patient was given the following information about Chronic Care Management services today, agreed to services, and gave verbal consent: 1. CCM service includes personalized support from designated clinical staff supervised by the primary care provider, including individualized plan of care and coordination with other care providers 2. 24/7 contact phone numbers for assistance for urgent and routine care needs. 3. Service will only be billed when office clinical staff spend 20 minutes or more in a month to coordinate care. 4. Only one practitioner may furnish and bill the service in a calendar month. 5.The patient may stop CCM services at any time (effective at the end of the month) by phone call to the office staff. 6. The patient will be responsible for cost sharing (co-pay) of up to 20% of the service fee (after annual deductible is met). Patient agreed to services and consent obtained.  Patient Care Team: Vivi Barrack, MD as PCP - General (Family Medicine) Kathrynn Ducking, MD as Consulting Physician (Neurology) Marin Olp  Rudell Cobb, MD as Consulting Physician (Oncology) Edythe Clarity, [facility] (Pharmacist)  Recent office visits:  04/18/2021 OV Parkinson's disease, no further information available.   04/05/2021 OV Parkinson's disease, no further information available.   04/03/2021 OV Parkinson's disease, no further information available.   03/29/2021 OV Parkinson's disease, no further information available.   03/27/2021 OV Parkinson's disease, no further information available.   03/21/2021 OV Parkinson's disease, no further information available.   03/09/2021 OV (PCP) Vivi Barrack, MD; stop meclizine, start low-dose Valium.   03/02/2021 OV (urgent care) Dizziness, no medication changes indicated.   Recent consult visits:  None   Hospital visits:  02/28/2021 ED Visit, Sherwood Gambler, MD; Dizziness, Rx Meclizine 25 mg TID PRN   Objective:  Lab Results  Component Value Date   CREATININE 0.60 02/28/2021   BUN 12 02/28/2021   GFR 103.67 08/05/2019   GFRNONAA >60 02/28/2021   GFRAA >60 01/31/2020   NA 139 02/28/2021   K 3.8 02/28/2021   CALCIUM 9.0 02/28/2021   CO2 25 02/28/2021   GLUCOSE 88 02/28/2021    Lab Results  Component Value Date/Time   GFR 103.67 08/05/2019 03:12 PM   GFR 101.91 04/20/2018 03:14 PM    Last diabetic Eye exam: No results found for: HMDIABEYEEXA  Last diabetic Foot exam: No results found for: HMDIABFOOTEX   Lab Results  Component Value Date   CHOL 159 08/05/2019   HDL 74.10 08/05/2019   LDLCALC 76 08/05/2019   TRIG 45.0 08/05/2019   CHOLHDL 2 08/05/2019    Hepatic Function Latest Ref Rng &  Units 02/28/2021 02/01/2020 08/05/2019  Total Protein 6.5 - 8.1 g/dL 6.2(L) 7.5 7.4  Albumin 3.5 - 5.0 g/dL 3.5 4.4 4.5  AST 15 - 41 U/L $Remo'17 28 16  'RtjFH$ ALT 0 - 44 U/L $Remo'7 15 5  'xXeGw$ Alk Phosphatase 38 - 126 U/L 48 48 56  Total Bilirubin 0.3 - 1.2 mg/dL 0.6 0.9 0.6  Bilirubin, Direct 0.0 - 0.2 mg/dL - 0.3(H) -    Lab Results  Component Value Date/Time   TSH 0.51 08/05/2019  03:12 PM   TSH 0.64 04/20/2018 03:14 PM    CBC Latest Ref Rng & Units 02/28/2021 02/28/2021 02/01/2020  WBC 4.0 - 10.5 K/uL - 8.9 5.6  Hemoglobin 12.0 - 15.0 g/dL 12.9 12.6 13.2  Hematocrit 36.0 - 46.0 % 38.0 39.1 41.5  Platelets 150 - 400 K/uL - 137(L) 137(L)    Lab Results  Component Value Date/Time   VD25OH 37.66 08/05/2019 03:12 PM    Clinical ASCVD: No  The 10-year ASCVD risk score Mikey Bussing DC Jr., et al., 2013) is: 7.9%   Values used to calculate the score:     Age: 37 years     Sex: Female     Is Non-Hispanic African American: Yes     Diabetic: No     Tobacco smoker: No     Systolic Blood Pressure: 600 mmHg     Is BP treated: No     HDL Cholesterol: 74.1 mg/dL     Total Cholesterol: 159 mg/dL    Depression screen [facility] 2/9 03/09/2021 09/21/2019 09/17/2018  Decreased Interest 0 0 0  Down, Depressed, Hopeless 0 0 0  PHQ - 2 Score 0 0 0  Altered sleeping - - 0  Tired, decreased energy - - 2  Change in appetite - - 0  Feeling bad or failure about yourself  - - 0  Trouble concentrating - - 0  Moving slowly or fidgety/restless - - 3  Suicidal thoughts - - 0  PHQ-9 Score - - 5  Difficult doing work/chores - - Somewhat difficult     Social History   Tobacco Use  Smoking Status Never  Smokeless Tobacco Never   BP Readings from Last 3 Encounters:  03/09/21 108/74  03/02/21 122/88  02/28/21 126/80   Pulse Readings from Last 3 Encounters:  03/09/21 89  03/02/21 90  02/28/21 93   Wt Readings from Last 3 Encounters:  03/09/21 121 lb (54.9 kg)  08/03/20 125 lb (56.7 kg)  05/22/20 110 lb (49.9 kg)   BMI Readings from Last 3 Encounters:  03/09/21 18.95 kg/m  08/03/20 19.58 kg/m  05/22/20 17.23 kg/m    Assessment/Interventions: Review of patient past medical history, allergies, medications, health status, including review of consultants reports, laboratory and other test data, was performed as part of comprehensive evaluation and provision of chronic care management  services.   SDOH:  (Social Determinants of Health) assessments and interventions performed: Yes  Financial Resource Strain: Low Risk    Difficulty of Paying Living Expenses: Not very hard    SDOH Screenings   Alcohol Screen: Not on file  Depression (PHQ2-9): Low Risk    PHQ-2 Score: 0  Financial Resource Strain: Low Risk    Difficulty of Paying Living Expenses: Not very hard  Food Insecurity: Not on file  Housing: Not on file  Physical Activity: Not on file  Social Connections: Not on file  Stress: Not on file  Tobacco Use: Low Risk    Smoking Tobacco Use: Never  Smokeless Tobacco Use: Never  Transportation Needs: Not on file    CCM Care Plan  Allergies  Allergen Reactions   Sulfa Antibiotics Other (See Comments)    Childhood allergy     Medications Reviewed Today     Reviewed by Edythe Clarity, [facility] (Pharmacist) on 06/19/21 at 1437  Med List Status: <None>   Medication Order Taking? Sig Documenting Provider Last Dose Status Informant   Patient not taking:   Discontinued 02/01/20 0633   Ascorbic Acid (VITAMIN C PO) [id number] Yes Take 1 tablet by mouth daily. [provider] Taking Active Self  b complex vitamins capsule [id number] Yes Take 1 capsule by mouth daily. [provider] Taking Active Self  carbidopa-levodopa (SINEMET IR) 25-250 MG tablet [id number] Yes TAKE 1 TABLET BY MOUTH THREE TIMES DAILY Vivi Barrack, MD Taking Active            Med Note Rosana Hoes, Samreet Edenfield L   Tue Jun 19, 2021  2:09 PM) Only taking twice daily  Cholecalciferol (VITAMIN D) 2000 UNITS CAPS [id number] Yes Take 5,000 Units by mouth daily. [provider] Taking Active Self  diazepam (VALIUM) 2 MG tablet [id number] Yes Take 0.5 tablets (1 mg total) by mouth every 12 (twelve) hours as needed (vertigo). Vivi Barrack, MD Taking Active   Diclofenac Sodium (PENNSAID) 2 % SOLN [id number] No Place 1-2 Pump onto the skin 4 (four) times daily as needed.  Patient not  taking: Reported on 06/19/2021   Vivi Barrack, MD Not Taking Active   docusate sodium (COLACE) 100 MG capsule [id number] Yes Take 1 capsule (100 mg total) by mouth every 12 (twelve) hours. Sherwood Gambler, MD Taking Active   escitalopram (LEXAPRO) 20 MG tablet [id number] Yes TAKE 1 TABLET BY MOUTH DAILY Vivi Barrack, MD Taking Active   fluticasone [facility]) 50 MCG/ACT nasal spray [id number] Yes Place 2 sprays into both nostrils daily. Vallery Sa Taking Active   glucosamine-chondroitin 500-400 MG tablet [id number] Yes Take 1 tablet by mouth daily. [provider] Taking Active Self  ibuprofen (ADVIL,MOTRIN) 200 MG tablet [id number]  Take 400 mg by mouth every 6 (six) hours as needed for moderate pain.  [provider]  Active Self  lubiprostone (AMITIZA) 8 MCG capsule [id number] No Take 1 capsule (8 mcg total) by mouth 2 (two) times daily with a meal.  Patient not taking: Reported on 06/19/2021   Vivi Barrack, MD Not Taking Active   Magnesium Hydroxide (MAGNESIA PO) [id number] Yes Take 1 tablet by mouth daily. [provider] Taking Active Self  Multiple Vitamin (MULTIVITAMIN) capsule [id number] Yes Take 1 capsule by mouth daily. [provider] Taking Active Self  Omega-3 Fatty Acids (OMEGA 3 PO) [id number] Yes Take 1 tablet by mouth daily. [provider] Taking Active Self  tetrahydrozoline-zinc (VISINE-AC) 0.05-0.25 % ophthalmic solution [id number]  Place 2 drops into both eyes 4 (four) times daily as needed (allergies). [provider]  Active Self  vitamin B-12 (CYANOCOBALAMIN) 1000 MCG tablet [id number] Yes Take by mouth. [provider] Taking Active             Patient Active Problem List   Diagnosis Date Noted   DDD (degenerative disc disease), cervical 06/18/2019   Constipation 03/02/2019   Seasonal allergies 03/02/2019   Anxiety 05/07/2018   Fatigue 04/20/2018   Osteoarthritis 04/20/2018   Palpitations  04/20/2018   Cardiomegaly 04/20/2018   Lesion of liver 04/20/2018   Lung nodule 04/20/2018   Muscle  weakness of lower extremity 01/17/2015   Parkinson disease ([place]) 01/17/2015   Fibroid uterus 11/13/2012   Benign colonic polyp 10/02/2012    Immunization History  Administered Date(s) Administered   Fluad Quad(high Dose 65+) 08/05/2019    Conditions to be addressed/monitored:  Parkinson's Disease, Anxiety, Constipation, Allergies  Care Plan : General Pharmacy (Adult)  Updates made by Edythe Clarity, RPH since 06/19/2021 12:00 AM     Problem: Parkinson's Disease, Anxiety, Constipation, Allergies   Priority: High  Onset Date: 06/19/2021     Long-Range Goal: Patient-Specific Goal   Start Date: 06/19/2021  Expected End Date: 12/20/2021  This Visit's Progress: On track  Priority: High  Note:   Current Barriers:  Cannot pick up her own medications  Pharmacist Clinical Goal(s):  Patient will work to make medication delivery and pickup easier through collaboration with PharmD and provider.   Interventions: 1:1 collaboration with Vivi Barrack, MD regarding development and update of comprehensive plan of care as evidenced by provider attestation and co-signature Inter-disciplinary care team collaboration (see longitudinal plan of care) Comprehensive medication review performed; medication list updated in electronic medical record  Depression/Anxiety (Goal: Minimize symptoms) -Controlled -Current treatment: Escitalopram 20mg  daily Valium 2mg  - one half tablet daily prn -Medications previously tried/failed: citalopram -PHQ9:  [place] Office Visit from 09/17/2018 in [place]  PHQ-9 Total Score 5      -GAD7:  GAD 7 : Generalized Anxiety Score 09/17/2018 05/07/2018  Nervous, Anxious, on Edge 1 3  Control/stop worrying 0 1  Worry too much - different things 0 0  Trouble relaxing 1 3  Restless 0 1  Easily annoyed or irritable 0 0  Afraid  - awful might happen 0 0  Total GAD 7 Score 2 8  Anxiety Difficulty Somewhat difficult Very difficult  -Educated on Benefits of medication for symptom control -Patient taking medication at night and having a difficult time sleeping longer than 4-5 hours. -Recommended to continue current medication Trial taking medication in the morning to see if this helps with sleep concern.  Parkinson's Disease (Goal: Control symptoms) -Controlled -Current treatment  Carbidopa-levodopa 25-250 mg (patient only taking twice daily) -Medications previously tried: none noted -Only takes twice daily because it makes her sleepy  -She is able to take care of herself, perform ADL's still -Recommended to continue current medication  Constipation (Goal: Regular bowel movements) -Controlled -Current treatment  Colace 100mg  prn -Medications previously tried: Amitiza (too strong) -Bowels are normal at this time  -Recommended to continue current medication  Allergies (Goal: Minimize symptoms) -Controlled -Current treatment  Fluticasone 52mcg prn -Medications previously tried: none noted -Does not have to use very often, allergy symptoms have been controlled lately  -Recommended to continue current medication  Osteoarthritis (Goal: Control symptoms) -Controlled -Current treatment  None -Medications previously tried: none noted -Pain level remains the same - no changes at this time  -Recommended to continue current medication  Patient Goals/Self-Care Activities Patient will:  - take medications as prescribed focus on medication adherence by pill counts target a minimum of 150 minutes of moderate intensity exercise weekly  Follow Up Plan: The care management team will reach out to the patient again over the next 180 days.         Medication Assistance: None required.  Patient affirms current coverage meets needs.  Compliance/Adherence/Medication fill history: Care Gaps: Zoster  Vaccine  Star-Rating Drugs: None  Patient's preferred pharmacy is:  [facility] [place], [place] - [address] AT [facility] [address] [place] [address] Phone: [phone] Fax: [phone]  Uses pill box? Yes Pt endorses 100% compliance  We discussed: Verbal consent obtained for [facility] enhanced pharmacy services (medication synchronization, adherence packaging, delivery coordination). A medication sync plan was created to allow patient to get all medications delivered once every 30 to 90 days per patient preference. Patient understands they have freedom to choose pharmacy and clinical pharmacist will coordinate care between all prescribers and [facility]. Patient decided to: Utilize [facility] for medication synchronization, packaging and delivery  Care Plan and Follow Up Patient Decision:  Patient agrees to Care Plan and Follow-up.  Plan: The care management team will reach out to the patient again over the next 180 days.  Beverly Milch, PharmD Clinical Pharmacist [phone]

## 2021-06-27 ENCOUNTER — Telehealth: Payer: Self-pay | Admitting: Pharmacist

## 2021-06-27 NOTE — Chronic Care Management (AMB) (Addendum)
    Chronic Care Management Pharmacy Assistant   Name: [name] [name]  MRN: [id number] DOB: [birth year]   Reason for Encounter: Onboarding Process    Recent office visits:  None  Recent consult visits:  None  Hospital visits:  None in previous 6 months  Medications: Outpatient Encounter Medications as of 06/27/2021  Medication Sig Note   Ascorbic Acid (VITAMIN C PO) Take 1 tablet by mouth daily.    b complex vitamins capsule Take 1 capsule by mouth daily.    carbidopa-levodopa (SINEMET IR) 25-250 MG tablet TAKE 1 TABLET BY MOUTH THREE TIMES DAILY 06/19/2021: Only taking twice daily   Cholecalciferol (VITAMIN D) 2000 UNITS CAPS Take 5,000 Units by mouth daily.    diazepam (VALIUM) 2 MG tablet Take 0.5 tablets (1 mg total) by mouth every 12 (twelve) hours as needed (vertigo).    Diclofenac Sodium (PENNSAID) 2 % SOLN Place 1-2 Pump onto the skin 4 (four) times daily as needed. (Patient not taking: Reported on 06/19/2021)    docusate sodium (COLACE) 100 MG capsule Take 1 capsule (100 mg total) by mouth every 12 (twelve) hours.    escitalopram (LEXAPRO) 20 MG tablet TAKE 1 TABLET BY MOUTH DAILY    fluticasone (FLONASE) 50 MCG/ACT nasal spray Place 2 sprays into both nostrils daily.    glucosamine-chondroitin 500-400 MG tablet Take 1 tablet by mouth daily.    ibuprofen (ADVIL,MOTRIN) 200 MG tablet Take 400 mg by mouth every 6 (six) hours as needed for moderate pain.     lubiprostone (AMITIZA) 8 MCG capsule Take 1 capsule (8 mcg total) by mouth 2 (two) times daily with a meal. (Patient not taking: Reported on 06/19/2021)    Magnesium Hydroxide (MAGNESIA PO) Take 1 tablet by mouth daily.    Multiple Vitamin (MULTIVITAMIN) capsule Take 1 capsule by mouth daily.    Omega-3 Fatty Acids (OMEGA 3 PO) Take 1 tablet by mouth daily.    tetrahydrozoline-zinc (VISINE-AC) 0.05-0.25 % ophthalmic solution Place 2 drops into both eyes 4 (four) times daily as needed (allergies).    vitamin B-12  (CYANOCOBALAMIN) 1000 MCG tablet Take by mouth.    [DISCONTINUED] amantadine (SYMMETREL) 100 MG capsule Take 1 capsule (100 mg total) by mouth 2 (two) times daily. (Patient not taking: Reported on 09/21/2019)    No facility-administered encounter medications on file as of 06/27/2021.   Spoke with patient and reviewed all of her medications. Patient wants to onboard with [facility].  Onboarding form completed, all prescriptions have been transferred to [facility] from [employer] [facility].  Patient needs refills on all of her prescription medications. I sent a message to Team Jerline Pain for refill requests.  Patient also request to have all capsules instead of tablets for all of her over the counter medications. Patient would also like to have all capsules for her prescription medications as well as long as they are available.   April D Calhoun, [place] Pharmacist Assistant [phone]

## 2021-07-09 ENCOUNTER — Telehealth: Payer: Self-pay | Admitting: Pharmacist

## 2021-07-09 NOTE — Progress Notes (Signed)
    Chronic Care Management Pharmacy Assistant   Name: [name] [name]  MRN: [id number] DOB: [birth year]  Reason for Encounter: CCM Care Plan  Medications: Outpatient Encounter Medications as of 07/09/2021  Medication Sig Note   Ascorbic Acid (VITAMIN C PO) Take 1 tablet by mouth daily.    b complex vitamins capsule Take 1 capsule by mouth daily.    carbidopa-levodopa (SINEMET IR) 25-250 MG tablet TAKE 1 TABLET BY MOUTH THREE TIMES DAILY 06/19/2021: Only taking twice daily   Cholecalciferol (VITAMIN D) 2000 UNITS CAPS Take 5,000 Units by mouth daily.    diazepam (VALIUM) 2 MG tablet Take 0.5 tablets (1 mg total) by mouth every 12 (twelve) hours as needed (vertigo).    Diclofenac Sodium (PENNSAID) 2 % SOLN Place 1-2 Pump onto the skin 4 (four) times daily as needed. (Patient not taking: Reported on 06/19/2021)    docusate sodium (COLACE) 100 MG capsule Take 1 capsule (100 mg total) by mouth every 12 (twelve) hours.    escitalopram (LEXAPRO) 20 MG tablet TAKE 1 TABLET BY MOUTH DAILY    fluticasone (FLONASE) 50 MCG/ACT nasal spray Place 2 sprays into both nostrils daily.    glucosamine-chondroitin 500-400 MG tablet Take 1 tablet by mouth daily.    ibuprofen (ADVIL,MOTRIN) 200 MG tablet Take 400 mg by mouth every 6 (six) hours as needed for moderate pain.     lubiprostone (AMITIZA) 8 MCG capsule Take 1 capsule (8 mcg total) by mouth 2 (two) times daily with a meal. (Patient not taking: Reported on 06/19/2021)    Magnesium Hydroxide (MAGNESIA PO) Take 1 tablet by mouth daily.    Multiple Vitamin (MULTIVITAMIN) capsule Take 1 capsule by mouth daily.    Omega-3 Fatty Acids (OMEGA 3 PO) Take 1 tablet by mouth daily.    tetrahydrozoline-zinc (VISINE-AC) 0.05-0.25 % ophthalmic solution Place 2 drops into both eyes 4 (four) times daily as needed (allergies).    vitamin B-12 (CYANOCOBALAMIN) 1000 MCG tablet Take by mouth.    [DISCONTINUED] amantadine (SYMMETREL) 100 MG capsule Take 1 capsule (100 mg total)  by mouth 2 (two) times daily. (Patient not taking: Reported on 09/21/2019)    No facility-administered encounter medications on file as of 07/09/2021.    Reviewed the patients initial visit reinsured it was completed per the pharmacist Leata Mouse request. Printed the CCM care plan. Mailed the patient CCM care plan to their most recent address on file.  Follow-Up:Pharmacist Review  Charlann Lange, [place] Pharmacist Assistant [phone]

## 2021-07-10 ENCOUNTER — Other Ambulatory Visit: Payer: Self-pay

## 2021-07-10 ENCOUNTER — Telehealth: Payer: Self-pay | Admitting: *Deleted

## 2021-07-10 ENCOUNTER — Telehealth: Payer: Self-pay

## 2021-07-10 MED ORDER — CARBIDOPA-LEVODOPA 25-250 MG PO TABS: 1.0000 | ORAL_TABLET | Freq: Three times a day (TID) | ORAL | 2 refills | Status: DC

## 2021-07-10 MED ORDER — DIAZEPAM 2 MG PO TABS: 1.0000 mg | ORAL_TABLET | Freq: Two times a day (BID) | ORAL | 0 refills | Status: DC | PRN

## 2021-07-10 MED ORDER — ESCITALOPRAM OXALATE 20 MG PO TABS: ORAL_TABLET | ORAL | 3 refills | Status: DC

## 2021-07-10 NOTE — Telephone Encounter (Signed)
-----   Message from April D Calhoun sent at 07/10/2021 10:33 AM EDT ----- Regarding: Rx diclofenac, diazepam [facility] D[facility]ac and Diazepam.   Thank you,   April D Calhoun, [place] Pharmacist Assistant [phone]

## 2021-07-10 NOTE — Progress Notes (Signed)
OK I will reach out to [facility] on that.  Looks like sinemet was sent in to [facility] instead of [facility] and they will not let us transfer a new rx for a controlled substance.

## 2021-07-10 NOTE — Telephone Encounter (Signed)
-----   Message from April D Calhoun sent at 07/10/2021 10:33 AM EDT ----- Regarding: Rx diclofenac, diazepam [facility] Dic[facility]ac and Diazepam.   Thank you,   April D Calhoun, [place] Pharmacist Assistant [phone]

## 2021-07-10 NOTE — Progress Notes (Signed)
FYI, see below.

## 2021-07-10 NOTE — Telephone Encounter (Signed)
-----   Message from Edythe Clarity, [facility] sent at 07/09/2021  4:02 PM EDT ----- Patient wants to use [facility] for med sync and packaging/delivery.  Can we send Rx for Diazeopam, Carb/levo and escitalopram to [facility] for upcoming delivery please?  Thanks!!

## 2021-07-10 NOTE — Progress Notes (Signed)
Not really sure what this was meant to be.  Her sinemet needs to be filled by her neurologist. This is not a routine med we prescribe.  Her valium was already sent in.  Algis Greenhouse. Jerline Pain, MD 07/10/2021 1:02 PM

## 2021-07-10 NOTE — Telephone Encounter (Signed)
Refill sent to [facility], Diazepam sent to Dr. Jerline Pain for approval.

## 2021-07-11 DIAGNOSIS — Z1231 Encounter for screening mammogram for malignant neoplasm of breast: Secondary | ICD-10-CM | POA: Diagnosis not present

## 2021-07-11 LAB — HM MAMMOGRAPHY

## 2021-07-14 ENCOUNTER — Other Ambulatory Visit: Payer: Self-pay | Admitting: Family Medicine

## 2021-07-19 ENCOUNTER — Other Ambulatory Visit: Payer: Self-pay | Admitting: Family Medicine

## 2021-08-13 ENCOUNTER — Telehealth: Payer: Self-pay

## 2021-08-13 NOTE — Telephone Encounter (Signed)
Nurse Assessment Nurse: Gloriann Loan RN, Sharyn Lull Date/Time (Eastern Time): 08/11/2021 8:16:23 AM Confirm and document reason for call. If symptomatic, describe symptoms. ---Caller states, tried to take herself off the Lexapro 20mg . Feeling very edgy, anxiety. Wants to know how to get edge off. How much should she take. She made the decision to get off due to thinking the med was too strong. Been off since a week and half. Does the patient have any new or worsening symptoms? ---Yes Will a triage be completed? ---Yes Related visit to physician within the last 2 weeks? ---No Does the PT have any chronic conditions? (i.e. diabetes, asthma, this includes High risk factors for pregnancy, etc.) ---Yes List chronic conditions. ---Parkinson's Is this a behavioral health or substance abuse call? ---No Guidelines Guideline Title Affirmed Question Affirmed Notes Nurse Date/Time (Eastern Time) Anxiety and Panic Attack [1] SEVERE anxiety (e.g., extremely anxious with intense emotional symptoms such as feeling of unreality, urge to Gloriann Loan, RN, Sharyn Lull 08/11/2021 8:20:38 AM PLEASE NOTE: All timestamps contained within this report are represented as [place] Standard Time. CONFIDENTIALTY NOTICE: This fax transmission is intended only for the addressee. It contains information that is legally privileged, confidential or otherwise protected from use or disclosure. If you are not the intended recipient, you are strictly prohibited from reviewing, disclosing, copying using or disseminating any of this information or taking any action in reliance on or regarding this information. If you have received this fax in error, please notify us immediately by telephone so that we can arrange for its return to [place]. Phone: [phone], Toll-Free: [phone], Fax: [phone] Page: 2 of 2 Call Id: [id number] Guidelines Guideline Title Affirmed Question Affirmed Notes Nurse Date/Time Eilene Ghazi Time) flee, unable to  calm down; unable to cope or function) AND [2] not better after 10 minutes of reassurance and Care Advice Disp. Time Eilene Ghazi Time) Disposition Final User 08/11/2021 8:23:54 AM Go to ED Now (or PCP triage) Yes Gloriann Loan, RN, Julio Sicks Disagree/Comply Comply Caller Understands Yes PreDisposition Call Doctor Care Advice Given Per Guideline GO TO ED NOW (OR PCP TRIAGE): * IF NO PCP (PRIMARY CARE PROVIDER) SECOND-LEVEL TRIAGE: You need to be seen within the next hour. Go to the [facility] at _____________ [place] as soon as you can. CARE ADVICE given per Anxiety and Panic Attack (Adult) guideline. ANOTHER ADULT SHOULD DRIVE: * It is better and safer if another adult drives instead of you. Referrals [facility] at [facility]

## 2021-08-13 NOTE — Telephone Encounter (Signed)
Please schedule OV to f/u on this. 

## 2021-08-13 NOTE — Telephone Encounter (Signed)
Patient was advised to schedule, states she needs to talk with her daughter for transportation.

## 2021-08-17 ENCOUNTER — Telehealth (INDEPENDENT_AMBULATORY_CARE_PROVIDER_SITE_OTHER): Payer: Medicare HMO | Admitting: Family Medicine

## 2021-08-17 ENCOUNTER — Ambulatory Visit: Payer: Medicare HMO | Admitting: Family Medicine

## 2021-08-17 VITALS — Ht 67.0 in | Wt 131.0 lb

## 2021-08-17 DIAGNOSIS — G20A1 Parkinson's disease without dyskinesia, without mention of fluctuations: Secondary | ICD-10-CM

## 2021-08-17 DIAGNOSIS — G20 Parkinson's disease: Secondary | ICD-10-CM | POA: Diagnosis not present

## 2021-08-17 DIAGNOSIS — F419 Anxiety disorder, unspecified: Secondary | ICD-10-CM | POA: Diagnosis not present

## 2021-08-17 NOTE — Progress Notes (Signed)
   [name] [name] is a 70 y.o. female who presents today for a telephone visit.  Assessment/Plan:  Chronic Problems Addressed Today: Parkinson disease Will refer to neurology.  Her previous neurologist retired.  She is on Sinemet.  Anxiety Worsened recently due to self discontinuing Lexapro.  She would like to stop Lexapro completely.  Advised her to take half a pill every other day for the next week and then stop.  She can use the Valium 1 mg twice daily as needed.  They will check in with me next week to let me know how she is progressing.  If she continues to have issues with significant anxiety would consider trial of Remeron or alternative SSRI.     Subjective:  HPI:  See A/P for status of chronic conditions.  Patient was concerned that her Lexapro was causing her side effects including fatigue and malaise.  She decided to stop taking this about a week and a half ago.  She was experiencing some withdrawal effects and increased anxiety and has been taking a half a tablet for the last week or so.  She still feels tremulous with increased anxiety.  She would like to stop Lexapro if possible.       Objective/Observations   NAD  Telephone Visit   I connected with [name] [name] on 08/17/21 at  4:00 PM EDT via telephone and verified that I am speaking with the correct person using two identifiers. I discussed the limitations of evaluation and management by telemedicine and the availability of in person appointments. The patient expressed understanding and agreed to proceed.   Patient location: Home Provider location: [place] participating in the virtual visit: Myself and Patient and her daughter.   A total of 15 minutes were spent on medical discussion.      Algis Greenhouse. Jerline Pain, MD 08/17/2021 4:15 PM

## 2021-08-17 NOTE — Assessment & Plan Note (Signed)
Will refer to neurology.  Her previous neurologist retired.  She is on Sinemet.

## 2021-08-17 NOTE — Assessment & Plan Note (Signed)
Worsened recently due to self discontinuing Lexapro.  She would like to stop Lexapro completely.  Advised her to take half a pill every other day for the next week and then stop.  She can use the Valium 1 mg twice daily as needed.  They will check in with me next week to let me know how she is progressing.  If she continues to have issues with significant anxiety would consider trial of Remeron or alternative SSRI.

## 2021-08-17 NOTE — Progress Notes (Incomplete)
   [name] [name] is a 70 y.o. female who presents today for an office visit.  Assessment/Plan:  New/Acute Problems: ***  Chronic Problems Addressed Today: No problem-specific Assessment & Plan notes found for this encounter.     Subjective:  HPI:  ***        Objective:  Physical Exam: There were no vitals taken for this visit.  Gen: No acute distress, resting comfortably*** CV: Regular rate and rhythm with no murmurs appreciated Pulm: Normal work of breathing, clear to auscultation bilaterally with no crackles, wheezes, or rhonchi Neuro: Grossly normal, moves all extremities Psych: Normal affect and thought content      I,Jordan Kelly,acting as a scribe for Dimas Chyle, MD.,have documented all relevant documentation on the behalf of Dimas Chyle, MD,as directed by  Dimas Chyle, MD while in the presence of Dimas Chyle, MD. *** Algis Greenhouse. Jerline Pain, MD 08/17/2021 7:55 AM

## 2021-08-20 ENCOUNTER — Ambulatory Visit: Payer: Medicare HMO | Admitting: Family Medicine

## 2021-08-23 ENCOUNTER — Telehealth (INDEPENDENT_AMBULATORY_CARE_PROVIDER_SITE_OTHER): Payer: Medicare HMO | Admitting: Family Medicine

## 2021-08-23 VITALS — Ht 67.0 in | Wt 131.0 lb

## 2021-08-23 DIAGNOSIS — F419 Anxiety disorder, unspecified: Secondary | ICD-10-CM | POA: Diagnosis not present

## 2021-08-23 DIAGNOSIS — G20 Parkinson's disease: Secondary | ICD-10-CM | POA: Diagnosis not present

## 2021-08-23 NOTE — Progress Notes (Signed)
   [name] [name] is a 70 y.o. female who presents today for a virtual office visit.  Assessment/Plan:  Chronic Problems Addressed Today: Anxiety Doing much better now.  She took her last dose of Lexapro this morning.  Advised her to not take this reports of neck several days.  She will continue taking Valium as needed.  We will see her back in a few weeks.  She will let me know if her symptoms worsen again.  May consider trial of Remeron in the future.  Parkinson disease Referral to neurology pending.  She is on Sinemet. Her previous neurologist retired.     Subjective:  HPI:  Patient here for follow up. We had a virtual visit about a week ago.  She was having issues with withdrawal to Lexapro at that time.  We decided to wean her off of Lexapro.  She has been using Valium as needed.  She has been doing well over the past week.       Objective/Observations  Physical Exam: Gen: NAD, resting comfortably Pulm: Normal work of breathing Neuro: Grossly normal, moves all extremities Psych: Normal affect and thought content  Virtual Visit via Video   I connected with [name] [name] on 08/23/21 at  4:00 PM EDT by a video enabled telemedicine application and verified that I am speaking with the correct person using two identifiers. The limitations of evaluation and management by telemedicine and the availability of in person appointments were discussed. The patient expressed understanding and agreed to proceed.   Patient location: Home Provider location: [place] participating in the virtual visit: Myself, Patient, and her daughter     Algis Greenhouse. Jerline Pain, MD 08/23/2021 4:21 PM

## 2021-08-23 NOTE — Assessment & Plan Note (Signed)
Doing much better now.  She took her last dose of Lexapro this morning.  Advised her to not take this reports of neck several days.  She will continue taking Valium as needed.  We will see her back in a few weeks.  She will let me know if her symptoms worsen again.  May consider trial of Remeron in the future.

## 2021-08-23 NOTE — Assessment & Plan Note (Signed)
Referral to neurology pending.  She is on Sinemet. Her previous neurologist retired.

## 2021-09-07 ENCOUNTER — Telehealth: Payer: Self-pay

## 2021-09-07 NOTE — Telephone Encounter (Signed)
Pt's Daughter called stating that [facility] has not received the referral thru fax. Daughter wants to know if it can be resent. Please Advise.

## 2021-09-10 NOTE — Telephone Encounter (Signed)
Pt's daughter called back stating that [name] [name] still has not received the referral. She stated that it needs to be faxed to [phone]. Please Advise.

## 2021-09-11 ENCOUNTER — Encounter: Payer: Medicare HMO | Admitting: Family Medicine

## 2021-09-11 NOTE — Progress Notes (Incomplete)
Chief Complaint:  [name] [name] is a 70 y.o. female who presents today for her annual comprehensive physical exam.    Assessment/Plan:  New/Acute Problems: ***  Chronic Problems Addressed Today: No problem-specific Assessment & Plan notes found for this encounter.   There is no height or weight on file to calculate BMI. / ***    Preventative Healthcare: ***  Patient Counseling(The following topics were reviewed and/or handout was given):  -Nutrition: Stressed importance of moderation in sodium/caffeine intake, saturated fat and cholesterol, caloric balance, sufficient intake of fresh fruits, vegetables, and fiber.  -Stressed the importance of regular exercise.   -Substance Abuse: Discussed cessation/primary prevention of tobacco, alcohol, or other drug use; driving or other dangerous activities under the influence; availability of treatment for abuse.   -Injury prevention: Discussed safety belts, safety helmets, smoke detector, smoking near bedding or upholstery.   -Sexuality: Discussed sexually transmitted diseases, partner selection, use of condoms, avoidance of unintended pregnancy and contraceptive alternatives.   -Dental health: Discussed importance of regular tooth brushing, flossing, and dental visits.  -Health maintenance and immunizations reviewed. Please refer to Health maintenance section.  Return to care in 1 year for next preventative visit.     Subjective:  HPI:  She has ***no acute complaints today.   Lifestyle Diet: *** Exercise: ***  Depression screen [facility] 2/9 08/23/2021  Decreased Interest 0  Down, Depressed, Hopeless 0  PHQ - 2 Score 0  Altered sleeping -  Tired, decreased energy -  Change in appetite -  Feeling bad or failure about yourself  -  Trouble concentrating -  Moving slowly or fidgety/restless -  Suicidal thoughts -  PHQ-9 Score -  Difficult doing work/chores -    Health Maintenance Due  Topic Date Due   COVID-19 Vaccine (1)  Never done   TETANUS/TDAP  Never done   Zoster Vaccines- Shingrix (1 of 2) Never done   Pneumonia Vaccine 13+ Years old (1 - PCV) Never done   COLONOSCOPY (Pts 45-17yrs Insurance coverage will need to be confirmed)  01/10/2016   INFLUENZA VACCINE  05/28/2021     ROS: Per HPI, otherwise a complete review of systems was negative.   PMH:  The following were reviewed and entered/updated in epic: Past Medical History:  Diagnosis Date   Fibroid    GERD (gastroesophageal reflux disease)    Muscle weakness of lower extremity 01/17/2015   Osteopenia    Parkinson disease ([place]) 01/17/2015   Patient Active Problem List   Diagnosis Date Noted   DDD (degenerative disc disease), cervical 06/18/2019   Constipation 03/02/2019   Seasonal allergies 03/02/2019   Anxiety 05/07/2018   Fatigue 04/20/2018   Osteoarthritis 04/20/2018   Palpitations 04/20/2018   Cardiomegaly 04/20/2018   Lesion of liver 04/20/2018   Lung nodule 04/20/2018   Muscle weakness of lower extremity 01/17/2015   Parkinson disease ([place]) 01/17/2015   Fibroid uterus 11/13/2012   Benign colonic polyp 10/02/2012   Past Surgical History:  Procedure Laterality Date   FINGER SURGERY     MOUTH SURGERY     TOE SURGERY     TONSILLECTOMY     TUBAL LIGATION     x2    Family History  Problem Relation Age of Onset   Rheum arthritis Mother    Breast cancer Mother        62's   Cancer Father        throat   Breast cancer Sister  59's   Hypertension Brother    Hyperlipidemia Brother    Heart attack Son        Deceased, 58   Healthy Daughter    Parkinsonism Neg Hx     Medications- reviewed and updated Current Outpatient Medications  Medication Sig Dispense Refill   Ascorbic Acid (VITAMIN C PO) Take 1 tablet by mouth daily.     b complex vitamins capsule Take 1 capsule by mouth daily.     carbidopa-levodopa (SINEMET IR) 25-250 MG tablet Take 1 tablet by mouth 3 (three) times daily. 270 tablet 2   Cholecalciferol  (VITAMIN D) 2000 UNITS CAPS Take 5,000 Units by mouth daily.     diazepam (VALIUM) 2 MG tablet Take 0.5 tablets (1 mg total) by mouth every 12 (twelve) hours as needed (vertigo). 30 tablet 0   Diclofenac Sodium (PENNSAID) 2 % SOLN Place 1-2 Pump onto the skin 4 (four) times daily as needed. 112 g 1   escitalopram (LEXAPRO) 20 MG tablet TAKE 1 TABLET BY MOUTH DAILY 90 tablet 3   fluticasone (FLONASE) 50 MCG/ACT nasal spray Place 2 sprays into both nostrils daily. 16 mL 0   glucosamine-chondroitin 500-400 MG tablet Take 1 tablet by mouth daily.     ibuprofen (ADVIL,MOTRIN) 200 MG tablet Take 400 mg by mouth every 6 (six) hours as needed for moderate pain.      lubiprostone (AMITIZA) 8 MCG capsule Take 1 capsule (8 mcg total) by mouth 2 (two) times daily with a meal. (Patient not taking: No sig reported) 60 capsule 1   Magnesium Hydroxide (MAGNESIA PO) Take 1 tablet by mouth daily.     Multiple Vitamin (MULTIVITAMIN) capsule Take 1 capsule by mouth daily.     Omega-3 Fatty Acids (OMEGA 3 PO) Take 1 tablet by mouth daily.     tetrahydrozoline-zinc (VISINE-AC) 0.05-0.25 % ophthalmic solution Place 2 drops into both eyes 4 (four) times daily as needed (allergies).     vitamin B-12 (CYANOCOBALAMIN) 1000 MCG tablet Take by mouth.     No current facility-administered medications for this visit.    Allergies-reviewed and updated Allergies  Allergen Reactions   Sulfa Antibiotics Other (See Comments)    Childhood allergy     Social History   Socioeconomic History   Marital status: Divorced    Spouse name: Not on file   Number of children: 4   Years of education: B.A.   Highest education level: Not on file  Occupational History   Occupation: SECRETARY  Tobacco Use   Smoking status: Never   Smokeless tobacco: Never  Vaping Use   Vaping Use: Never used  Substance and Sexual Activity   Alcohol use: No    Alcohol/week: 0.0 standard drinks   Drug use: No   Sexual activity: Never  Other  Topics Concern   Not on file  Social History Narrative   Patient is right handed.   Patient drinks 2-3 cups of caffeine per day.   Lives alone in a 2 story home.  Has 4 living children.  1 passed away at age 44.     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  There is no height or weight on file to calculate BMI. Wt Readings from Last 3 Encounters:  08/23/21 131 lb (59.4 kg)  08/17/21 131 lb (59.4 kg)  03/09/21 121 lb (54.9 kg)   Gen: NAD, resting comfortably*** HEENT: TMs normal bilaterally. OP clear. No thyromegaly noted.  CV: RRR with no murmurs appreciated Pulm: NWOB, CTAB with no crackles, wheezes, or rhonchi GI: Normal bowel sounds present. Soft, Nontender, Nondistended. MSK: no edema, cyanosis, or clubbing noted Skin: warm, dry Neuro: CN2-12 grossly intact. Strength 5/5 in upper and lower extremities. Reflexes symmetric and intact bilaterally.  Psych: Normal affect and thought content      I,Savera Zaman,acting as a scribe for Dimas Chyle, MD.,have documented all relevant documentation on the behalf of Dimas Chyle, MD,as directed by  Dimas Chyle, MD while in the presence of Dimas Chyle, MD.   *** Algis Greenhouse. Jerline Pain, MD 09/11/2021 7:53 AM

## 2021-09-13 ENCOUNTER — Telehealth: Payer: Self-pay

## 2021-09-13 NOTE — Telephone Encounter (Signed)
  Encourage patient to contact the pharmacy for refills or they can request refills through [place]: 08/23/21  NEXT APPOINTMENT DATE:  MEDICATION:diazepam (VALIUM) 2 MG tablet  Is the patient out of medication? no  PHARMACY: [facility]   COMMENTS: 10 PILLS LEFT   Let patient know to contact pharmacy at the end of the day to make sure medication is ready.  Please notify patient to allow 48-72 hours to process

## 2021-09-14 MED ORDER — DIAZEPAM 2 MG PO TABS: 1.0000 mg | ORAL_TABLET | Freq: Two times a day (BID) | ORAL | 0 refills | Status: DC | PRN

## 2021-09-25 ENCOUNTER — Encounter: Payer: Self-pay | Admitting: Family Medicine

## 2021-09-25 ENCOUNTER — Ambulatory Visit (INDEPENDENT_AMBULATORY_CARE_PROVIDER_SITE_OTHER): Payer: Medicare HMO | Admitting: Family Medicine

## 2021-09-25 ENCOUNTER — Other Ambulatory Visit: Payer: Self-pay

## 2021-09-25 VITALS — BP 107/73 | HR 95 | Temp 98.1°F | Ht 67.0 in | Wt 117.0 lb

## 2021-09-25 DIAGNOSIS — Z23 Encounter for immunization: Secondary | ICD-10-CM

## 2021-09-25 DIAGNOSIS — Z13220 Encounter for screening for lipoid disorders: Secondary | ICD-10-CM

## 2021-09-25 DIAGNOSIS — R739 Hyperglycemia, unspecified: Secondary | ICD-10-CM

## 2021-09-25 DIAGNOSIS — F419 Anxiety disorder, unspecified: Secondary | ICD-10-CM

## 2021-09-25 DIAGNOSIS — G20 Parkinson's disease: Secondary | ICD-10-CM | POA: Diagnosis not present

## 2021-09-25 DIAGNOSIS — Z1211 Encounter for screening for malignant neoplasm of colon: Secondary | ICD-10-CM

## 2021-09-25 DIAGNOSIS — G20A1 Parkinson's disease without dyskinesia, without mention of fluctuations: Secondary | ICD-10-CM

## 2021-09-25 DIAGNOSIS — Z0001 Encounter for general adult medical examination with abnormal findings: Secondary | ICD-10-CM

## 2021-09-25 MED ORDER — DIAZEPAM 2 MG PO TABS: 1.0000 mg | ORAL_TABLET | Freq: Two times a day (BID) | ORAL | 0 refills | Status: DC | PRN

## 2021-09-25 MED ORDER — BUSPIRONE HCL 5 MG PO TABS: 5.0000 mg | ORAL_TABLET | Freq: Two times a day (BID) | ORAL | 0 refills | Status: DC

## 2021-09-25 NOTE — Patient Instructions (Addendum)
It was very nice to see you today!  We will check blood work today.  Please start the BuSpar 5 mg twice daily.  Send a message in 1 to 2 weeks to let me know how this is working for you.  We gave you your flu and the pneumonia vaccines.  We will refer you for colonoscopy.  We will see back in year for your next physical.  Please come back sooner if needed.  Take care, Dr Jerline Pain  PLEASE NOTE:  If you had any lab tests please let us know if you have not heard back within a few days. You may see your results on mychart before we have a chance to review them but we will give you a call once they are reviewed by [place]. If we ordered any referrals today, please let us know if you have not heard from their office within the next week.   Please try these tips to maintain a healthy lifestyle:  Eat at least 3 REAL meals and 1-2 snacks per day.  Aim for no more than 5 hours between eating.  If you eat breakfast, please do so within one hour of getting up.   Each meal should contain half fruits/vegetables, one quarter protein, and one quarter carbs (no bigger than a computer mouse)  Cut down on sweet beverages. This includes juice, soda, and sweet tea.   Drink at least 1 glass of water with each meal and aim for at least 8 glasses per day  Exercise at least 150 minutes every week.    Preventive Care 14 Years and Older, Female Preventive care refers to lifestyle choices and visits with your health care provider that can promote health and wellness. Preventive care visits are also called wellness exams. What can I expect for my preventive care visit? Counseling Your health care provider may ask you questions about your: Medical history, including: Past medical problems. Family medical history. Pregnancy and menstrual history. History of falls. Current health, including: Memory and ability to understand (cognition). Emotional well-being. Home life and relationship well-being. Sexual  activity and sexual health. Lifestyle, including: Alcohol, nicotine or tobacco, and drug use. Access to firearms. Diet, exercise, and sleep habits. Work and work Statistician. Sunscreen use. Safety issues such as seatbelt and bike helmet use. Physical exam Your health care provider will check your: Height and weight. These may be used to calculate your BMI (body mass index). BMI is a measurement that tells if you are at a healthy weight. Waist circumference. This measures the distance around your waistline. This measurement also tells if you are at a healthy weight and may help predict your risk of certain diseases, such as type 2 diabetes and high blood pressure. Heart rate and blood pressure. Body temperature. Skin for abnormal spots. What immunizations do I need? Vaccines are usually given at various ages, according to a schedule. Your health care provider will recommend vaccines for you based on your age, medical history, and lifestyle or other factors, such as travel or where you work. What tests do I need? Screening Your health care provider may recommend screening tests for certain conditions. This may include: Lipid and cholesterol levels. Hepatitis C test. Hepatitis B test. HIV (human immunodeficiency virus) test. STI (sexually transmitted infection) testing, if you are at risk. Lung cancer screening. Colorectal cancer screening. Diabetes screening. This is done by checking your blood sugar (glucose) after you have not eaten for a while (fasting). Mammogram. Talk with your health care provider  about how often you should have regular mammograms. BRCA-related cancer screening. This may be done if you have a family history of breast, ovarian, tubal, or peritoneal cancers. Bone density scan. This is done to screen for osteoporosis. Talk with your health care provider about your test results, treatment options, and if necessary, the need for more tests. Follow these instructions at  home: Eating and drinking  Eat a diet that includes fresh fruits and vegetables, whole grains, lean protein, and low-fat dairy products. Limit your intake of foods with high amounts of sugar, saturated fats, and salt. Take vitamin and mineral supplements as recommended by your health care provider. Do not drink alcohol if your health care provider tells you not to drink. If you drink alcohol: Limit how much you have to 0-1 drink a day. Know how much alcohol is in your drink. In the [place] one drink equals one 12 oz bottle of beer (355 mL), one 5 oz glass of wine (148 mL), or one 1 oz glass of hard liquor (44 mL). Lifestyle Brush your teeth every morning and night with fluoride toothpaste. Floss one time each day. Exercise for at least 30 minutes 5 or more days each week. Do not use any products that contain nicotine or tobacco. These products include cigarettes, chewing tobacco, and vaping devices, such as e-cigarettes. If you need help quitting, ask your health care provider. Do not use drugs. If you are sexually active, practice safe sex. Use a condom or other form of protection in order to prevent STIs. Take aspirin only as told by your health care provider. Make sure that you understand how much to take and what form to take. Work with your health care provider to find out whether it is safe and beneficial for you to take aspirin daily. Ask your health care provider if you need to take a cholesterol-lowering medicine (statin). Find healthy ways to manage stress, such as: Meditation, yoga, or listening to music. Journaling. Talking to a trusted person. Spending time with friends and family. Minimize exposure to UV radiation to reduce your risk of skin cancer. Safety Always wear your seat belt while driving or riding in a vehicle. Do not drive: If you have been drinking alcohol. Do not ride with someone who has been drinking. When you are tired or distracted. While texting. If you have  been using any mind-altering substances or drugs. Wear a helmet and other protective equipment during sports activities. If you have firearms in your house, make sure you follow all gun safety procedures. What's next? Visit your health care provider once a year for an annual wellness visit. Ask your health care provider how often you should have your eyes and teeth checked. Stay up to date on all vaccines. This information is not intended to replace advice given to you by your health care provider. Make sure you discuss any questions you have with your health care provider. Document Revised: 04/11/2021 Document Reviewed: 04/11/2021 Elsevier Patient Education  [place].

## 2021-09-25 NOTE — Assessment & Plan Note (Signed)
Had extensive discussion with patient and her daughter regarding her anxiety.  She is doing much better now but feels like her Valium is sometimes too strong.  She has tried Lexapro and citalopram in the past with out significant improvement.  We will start low-dose BuSpar 5 mg twice daily.  They will send me a message in a few weeks via MyChart to let me know how she is doing.

## 2021-09-25 NOTE — Assessment & Plan Note (Addendum)
She will be following up with neurology in about a month.  She was on Sinemet.  Will place referral for physical therapy.  She has done well with this in the past.

## 2021-09-25 NOTE — Progress Notes (Signed)
Chief Complaint:  [name] [name] is a 70 y.o. female who presents today for her annual comprehensive physical exam.    Assessment/Plan:  Chronic Problems Addressed Today: Parkinson disease She will be following up with neurology in about a month.  She was on Sinemet.  Will place referral for physical therapy.  She has done well with this in the past.  Anxiety Had extensive discussion with patient and her daughter regarding her anxiety.  She is doing much better now but feels like her Valium is sometimes too strong.  She has tried Lexapro and citalopram in the past with out significant improvement.  We will start low-dose BuSpar 5 mg twice daily.  They will send me a message in a few weeks via MyChart to let me know how she is doing.   Preventative Healthcare: Flu shot and Prevnar 20 given today.  We will check labs.  Will place referral for colonoscopy.  Patient Counseling(The following topics were reviewed and/or handout was given):  -Nutrition: Stressed importance of moderation in sodium/caffeine intake, saturated fat and cholesterol, caloric balance, sufficient intake of fresh fruits, vegetables, and fiber.  -Stressed the importance of regular exercise.   -Substance Abuse: Discussed cessation/primary prevention of tobacco, alcohol, or other drug use; driving or other dangerous activities under the influence; availability of treatment for abuse.   -Injury prevention: Discussed safety belts, safety helmets, smoke detector, smoking near bedding or upholstery.   -Sexuality: Discussed sexually transmitted diseases, partner selection, use of condoms, avoidance of unintended pregnancy and contraceptive alternatives.   -Dental health: Discussed importance of regular tooth brushing, flossing, and dental visits.  -Health maintenance and immunizations reviewed. Please refer to Health maintenance section.  Return to care in 1 year for next preventative visit.     Subjective:  HPI:  She has  no acute complaints today.   See A/P for status of chronic conditions.  She had evidence of a possible month ago.  She is having issues with anxiety at that time.  She has been taking Valium 1 mg twice daily.  She is feels like this is sometimes too strong and causes her to sleep for most of the day.  She is interested in exploring alternative options for other options to help manage her anxiety.  She has been on Lexapro and Celexa in the past.  She did not feel like these were particularly effective.  Has not had any depressive symptoms.  Depression screen PHQ 2/9 08/23/2021  Decreased Interest 0  Down, Depressed, Hopeless 0  PHQ - 2 Score 0  Altered sleeping -  Tired, decreased energy -  Change in appetite -  Feeling bad or failure about yourself  -  Trouble concentrating -  Moving slowly or fidgety/restless -  Suicidal thoughts -  PHQ-9 Score -  Difficult doing work/chores -    Health Maintenance Due  Topic Date Due   TETANUS/TDAP  Never done   Zoster Vaccines- Shingrix (1 of 2) Never done   COLONOSCOPY (Pts 45-40yrs Insurance coverage will need to be confirmed)  01/10/2016     ROS: Per HPI, otherwise a complete review of systems was negative.   PMH:  The following were reviewed and entered/updated in epic: Past Medical History:  Diagnosis Date   Fibroid    GERD (gastroesophageal reflux disease)    Muscle weakness of lower extremity 01/17/2015   Osteopenia    Parkinson disease ([place]) 01/17/2015   Patient Active Problem List   Diagnosis Date Noted  DDD (degenerative disc disease), cervical 06/18/2019   Constipation 03/02/2019   Seasonal allergies 03/02/2019   Anxiety 05/07/2018   Fatigue 04/20/2018   Osteoarthritis 04/20/2018   Palpitations 04/20/2018   Cardiomegaly 04/20/2018   Lesion of liver 04/20/2018   Lung nodule 04/20/2018   Muscle weakness of lower extremity 01/17/2015   Parkinson disease ([place]) 01/17/2015   Fibroid uterus 11/13/2012   Benign colonic polyp  10/02/2012   Past Surgical History:  Procedure Laterality Date   FINGER SURGERY     MOUTH SURGERY     TOE SURGERY     TONSILLECTOMY     TUBAL LIGATION     x2    Family History  Problem Relation Age of Onset   Rheum arthritis Mother    Breast cancer Mother        73's   Cancer Father        throat   Breast cancer Sister        53's   Hypertension Brother    Hyperlipidemia Brother    Heart attack Son        Deceased, 62   Healthy Daughter    Parkinsonism Neg Hx     Medications- reviewed and updated Current Outpatient Medications  Medication Sig Dispense Refill   busPIRone (BUSPAR) 5 MG tablet Take 1 tablet (5 mg total) by mouth 2 (two) times daily. 60 tablet 0   Ascorbic Acid (VITAMIN C PO) Take 1 tablet by mouth daily.     b complex vitamins capsule Take 1 capsule by mouth daily.     carbidopa-levodopa (SINEMET IR) 25-250 MG tablet Take 1 tablet by mouth 3 (three) times daily. 270 tablet 2   Cholecalciferol (VITAMIN D) 2000 UNITS CAPS Take 5,000 Units by mouth daily.     diazepam (VALIUM) 2 MG tablet Take 0.5 tablets (1 mg total) by mouth every 12 (twelve) hours as needed (vertigo). 30 tablet 0   Diclofenac Sodium (PENNSAID) 2 % SOLN Place 1-2 Pump onto the skin 4 (four) times daily as needed. 112 g 1   fluticasone (FLONASE) 50 MCG/ACT nasal spray Place 2 sprays into both nostrils daily. 16 mL 0   glucosamine-chondroitin 500-400 MG tablet Take 1 tablet by mouth daily.     ibuprofen (ADVIL,MOTRIN) 200 MG tablet Take 400 mg by mouth every 6 (six) hours as needed for moderate pain.      Magnesium Hydroxide (MAGNESIA PO) Take 1 tablet by mouth daily.     Multiple Vitamin (MULTIVITAMIN) capsule Take 1 capsule by mouth daily.     Omega-3 Fatty Acids (OMEGA 3 PO) Take 1 tablet by mouth daily.     tetrahydrozoline-zinc (VISINE-AC) 0.05-0.25 % ophthalmic solution Place 2 drops into both eyes 4 (four) times daily as needed (allergies).     vitamin B-12 (CYANOCOBALAMIN) 1000 MCG  tablet Take by mouth.     No current facility-administered medications for this visit.    Allergies-reviewed and updated Allergies  Allergen Reactions   Sulfa Antibiotics Other (See Comments)    Childhood allergy     Social History   Socioeconomic History   Marital status: Divorced    Spouse name: Not on file   Number of children: 4   Years of education: B.A.   Highest education level: Not on file  Occupational History   Occupation: SECRETARY  Tobacco Use   Smoking status: Never   Smokeless tobacco: Never  Vaping Use   Vaping Use: Never used  Substance and Sexual Activity  Alcohol use: No    Alcohol/week: 0.0 standard drinks   Drug use: No   Sexual activity: Never  Other Topics Concern   Not on file  Social History Narrative   Patient is right handed.   Patient drinks 2-3 cups of caffeine per day.   Lives alone in a 2 story home.  Has 4 living children.  1 passed away at age 78.     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 Wt Readings from Last 3 Encounters:  09/25/21 117 lb (53.1 kg)  08/23/21 131 lb (59.4 kg)  08/17/21 131 lb (59.4 kg)   Gen: NAD, resting comfortably HEENT: TMs normal bilaterally. OP clear. No thyromegaly noted.  CV: RRR with no murmurs appreciated Pulm: NWOB, CTAB with no crackles, wheezes, or rhonchi GI: Normal bowel sounds present. Soft, Nontender, Nondistended. MSK: no edema, cyanosis, or  clubbing noted Skin: warm, dry Neuro: CN2-12 grossly intact. Strength 5/5 in upper and lower extremities. Reflexes symmetric and intact bilaterally.  Psych: Normal affect and thought content     Loney Domingo M. Jerline Pain, MD 09/25/2021 3:38 PM

## 2021-09-26 ENCOUNTER — Telehealth: Payer: Self-pay

## 2021-09-26 LAB — CBC
HCT: 42.1 % (ref 36.0–46.0)
Hemoglobin: 13.7 g/dL (ref 12.0–15.0)
MCHC: 32.5 g/dL (ref 30.0–36.0)
MCV: 88.7 fl (ref 78.0–100.0)
Platelets: 131 10*3/uL — ABNORMAL LOW (ref 150.0–400.0)
RBC: 4.74 Mil/uL (ref 3.87–5.11)
RDW: 13.3 % (ref 11.5–15.5)
WBC: 5.2 10*3/uL (ref 4.0–10.5)

## 2021-09-26 LAB — LIPID PANEL
Cholesterol: 161 mg/dL (ref 0–200)
HDL: 70.3 mg/dL (ref >39.00)
LDL Cholesterol: 80 mg/dL (ref 0–99)
NonHDL: 90.54
Total CHOL/HDL Ratio: 2
Triglycerides: 53 mg/dL (ref 0.0–149.0)
VLDL: 10.6 mg/dL (ref 0.0–40.0)

## 2021-09-26 LAB — COMPREHENSIVE METABOLIC PANEL
ALT: 10 U/L (ref 0–35)
AST: 15 U/L (ref 0–37)
Albumin: 4.4 g/dL (ref 3.5–5.2)
Alkaline Phosphatase: 40 U/L (ref 39–117)
BUN: 16 mg/dL (ref 6–23)
CO2: 26 mEq/L (ref 19–32)
Calcium: 10.1 mg/dL (ref 8.4–10.5)
Chloride: 104 mEq/L (ref 96–112)
Creatinine, Ser: 0.68 mg/dL (ref 0.40–1.20)
GFR: 87.51 mL/min (ref >60.00)
Glucose, Bld: 79 mg/dL (ref 70–99)
Potassium: 4.1 mEq/L (ref 3.5–5.1)
Sodium: 141 mEq/L (ref 135–145)
Total Bilirubin: 0.6 mg/dL (ref 0.2–1.2)
Total Protein: 7.4 g/dL (ref 6.0–8.3)

## 2021-09-26 LAB — HEMOGLOBIN A1C: Hgb A1c MFr Bld: 5.8 % (ref 4.6–6.5)

## 2021-09-26 LAB — TSH: TSH: 0.9 u[IU]/mL (ref 0.35–5.50)

## 2021-09-26 LAB — VITAMIN B12: Vitamin B-12: >1550 pg/mL — ABNORMAL HIGH (ref 211–911)

## 2021-09-26 NOTE — Telephone Encounter (Signed)
Patient called in wanting at home physical therapy, asked if we can change the referral.

## 2021-09-28 NOTE — Progress Notes (Signed)
Please inform patient of the following:  Labs are all STABLE. Do not need to make any changes to her treatment plan at this time. Would like for her to keep up the good work and we can recheck in a year.   Algis Greenhouse. Jerline Pain, MD 09/28/2021 1:19 PM

## 2021-10-02 ENCOUNTER — Telehealth: Payer: Self-pay | Admitting: Pharmacist

## 2021-10-02 ENCOUNTER — Other Ambulatory Visit: Payer: Self-pay | Admitting: *Deleted

## 2021-10-02 NOTE — Telephone Encounter (Signed)
Please advise 

## 2021-10-02 NOTE — Chronic Care Management (AMB) (Addendum)
Chronic Care Management Pharmacy Assistant   Name: [name] [name]  MRN: [id number] DOB: 22-Sep-1951   Reason for Encounter: Medication Coordination Call    Recent office visits:  09/25/2021 OV (PCP) Vivi Barrack, MD; Had extensive discussion with patient and her daughter regarding her anxiety.  She is doing much better now but feels like her Valium is sometimes too strong.  She has tried Lexapro and citalopram in the past with out significant improvement.  We will start low-dose BuSpar 5 mg twice daily.  They will send me a message in a few weeks via MyChart to let me know how she is doing.  08/23/2021 VV (PCP) Vivi Barrack, MD; Doing much better now.  She took her last dose of Lexapro this morning.  Advised her to not take this reports of neck several days.  She will continue taking Valium as needed.  We will see her back in a few weeks.  She will let me know if her symptoms worsen again.  May consider trial of Remeron in the future.  08/17/2021 VV (PCP) Vivi Barrack, MD; Worsened recently due to self discontinuing Lexapro.  She would like to stop Lexapro completely.  Advised her to take half a pill every other day for the next week and then stop.  She can use the Valium 1 mg twice daily as needed.  They will check in with me next week to let me know how she is progressing.  If she continues to have issues with significant anxiety would consider trial of Remeron or alternative SSRI.  Recent consult visits:  None  Hospital visits:  None in previous 6 months  Medications: Outpatient Encounter Medications as of 10/02/2021  Medication Sig   Ascorbic Acid (VITAMIN C PO) Take 1 tablet by mouth daily.   b complex vitamins capsule Take 1 capsule by mouth daily.   busPIRone (BUSPAR) 5 MG tablet Take 1 tablet (5 mg total) by mouth 2 (two) times daily.   carbidopa-levodopa (SINEMET IR) 25-250 MG tablet Take 1 tablet by mouth 3 (three) times daily.   Cholecalciferol (VITAMIN D) 2000  UNITS CAPS Take 5,000 Units by mouth daily.   diazepam (VALIUM) 2 MG tablet Take 0.5 tablets (1 mg total) by mouth every 12 (twelve) hours as needed (vertigo).   Diclofenac Sodium (PENNSAID) 2 % SOLN Place 1-2 Pump onto the skin 4 (four) times daily as needed.   fluticasone (FLONASE) 50 MCG/ACT nasal spray Place 2 sprays into both nostrils daily.   glucosamine-chondroitin 500-400 MG tablet Take 1 tablet by mouth daily.   ibuprofen (ADVIL,MOTRIN) 200 MG tablet Take 400 mg by mouth every 6 (six) hours as needed for moderate pain.    Magnesium Hydroxide (MAGNESIA PO) Take 1 tablet by mouth daily.   Multiple Vitamin (MULTIVITAMIN) capsule Take 1 capsule by mouth daily.   Omega-3 Fatty Acids (OMEGA 3 PO) Take 1 tablet by mouth daily.   tetrahydrozoline-zinc (VISINE-AC) 0.05-0.25 % ophthalmic solution Place 2 drops into both eyes 4 (four) times daily as needed (allergies).   vitamin B-12 (CYANOCOBALAMIN) 1000 MCG tablet Take by mouth.   [DISCONTINUED] amantadine (SYMMETREL) 100 MG capsule Take 1 capsule (100 mg total) by mouth 2 (two) times daily. (Patient not taking: Reported on 09/21/2019)   No facility-administered encounter medications on file as of 10/02/2021.    Reviewed chart for medication changes ahead of medication coordination call.  No OVs, Consults, or hospital visits since last care coordination call/Pharmacist visit. (If appropriate,  list visit date, provider name)  No medication changes indicated OR if recent visit, treatment plan here.  BP Readings from Last 3 Encounters:  09/25/21 107/73  03/09/21 108/74  03/02/21 122/88    Lab Results  Component Value Date   HGBA1C 5.8 09/25/2021     Patient obtains medications through Vials  90 Days    Patient is due for next adherence delivery on: 10/12/2021. Called patient and reviewed medications and coordinated delivery.  This delivery to include: Buspirone 5 mg twice daily Carbidopa-Levodopa 25-250 mg three times  daily Diazepam 2 mg  1/2 tablet q12hrs prn  Patient declined the following medications: Escitalopram - this medication has been discontinued.  Patient needs refills for Buspirone. Rx refill request sent to team [facility] requesting said refill.  Confirmed delivery date of 10/12/2021, advised patient that pharmacy will contact them the morning of delivery.  April D Calhoun, Interior Pharmacist Assistant [phone]   9 minutes spent in review, coordination, and documentation.  Reviewed by: Beverly Milch, PharmD Clinical Pharmacist [phone]

## 2021-10-02 NOTE — Telephone Encounter (Signed)
It is ok to place a referral to home health for PT.  Algis Greenhouse. Jerline Pain, MD 10/02/2021 2:36 PM

## 2021-10-04 ENCOUNTER — Other Ambulatory Visit: Payer: Self-pay | Admitting: *Deleted

## 2021-10-04 DIAGNOSIS — G20 Parkinson's disease: Secondary | ICD-10-CM

## 2021-10-04 NOTE — Telephone Encounter (Signed)
Referral placed.

## 2021-10-05 ENCOUNTER — Other Ambulatory Visit: Payer: Self-pay | Admitting: Family Medicine

## 2021-10-09 ENCOUNTER — Other Ambulatory Visit: Payer: Self-pay | Admitting: *Deleted

## 2021-10-09 ENCOUNTER — Telehealth: Payer: Self-pay

## 2021-10-09 MED ORDER — BUSPIRONE HCL 5 MG PO TABS: 5.0000 mg | ORAL_TABLET | Freq: Two times a day (BID) | ORAL | 0 refills | Status: DC

## 2021-10-09 NOTE — Telephone Encounter (Signed)
Error

## 2021-11-20 DIAGNOSIS — G20 Parkinson's disease: Secondary | ICD-10-CM | POA: Diagnosis not present

## 2021-12-14 ENCOUNTER — Other Ambulatory Visit: Payer: Self-pay

## 2021-12-14 ENCOUNTER — Ambulatory Visit (INDEPENDENT_AMBULATORY_CARE_PROVIDER_SITE_OTHER): Payer: Medicare HMO

## 2021-12-14 DIAGNOSIS — Z59819 Housing instability, housed unspecified: Secondary | ICD-10-CM

## 2021-12-14 DIAGNOSIS — Z0000 Encounter for general adult medical examination without abnormal findings: Secondary | ICD-10-CM | POA: Diagnosis not present

## 2021-12-14 NOTE — Addendum Note (Signed)
Addended by: Willette Brace on: 12/14/2021 03:45 PM   Modules accepted: Orders
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
## 2021-12-14 NOTE — Patient Instructions (Addendum)
[name] [name] , Thank you for taking time to come for your Medicare Wellness Visit. I appreciate your ongoing commitment to your health goals. Please review the following plan we discussed and let me know if I can assist you in the future.   Screening recommendations/referrals: Colonoscopy: pt will schedule at later date Done 9/14/232 repeat veery year  Mammogram: done 07/12/21 repeat every year  Bone Density: Done 11/19/12  Recommended yearly ophthalmology/optometry visit for glaucoma screening and checkup Recommended yearly dental visit for hygiene and checkup  Vaccinations: Influenza vaccine: Done 09/25/21 repeat every year  Pneumococcal vaccine: done 09/25/21  Tdap vaccine: Done 02/10/15 repeat every 10 years  Shingles vaccine: Shingrix discussed. Please contact your pharmacy for coverage information.    Covid-19:Declined and discussed   Advanced directives: Advance directive discussed with you today. Even though you declined this today please call our office should you change your mind and we can give you the proper paperwork for you to fill out.   Conditions/risks identified: get over the anxiety   Next appointment: Follow up in one year for your annual wellness visit    Preventive Care 65 Years and Older, Female Preventive care refers to lifestyle choices and visits with your health care provider that can promote health and wellness. What does preventive care include? A yearly physical exam. This is also called an annual well check. Dental exams once or twice a year. Routine eye exams. Ask your health care provider how often you should have your eyes checked. Personal lifestyle choices, including: Daily care of your teeth and gums. Regular physical activity. Eating a healthy diet. Avoiding tobacco and drug use. Limiting alcohol use. Practicing safe sex. Taking low-dose aspirin every day. Taking vitamin and mineral supplements as recommended by your health care provider. What  happens during an annual well check? The services and screenings done by your health care provider during your annual well check will depend on your age, overall health, lifestyle risk factors, and family history of disease. Counseling  Your health care provider may ask you questions about your: Alcohol use. Tobacco use. Drug use. Emotional well-being. Home and relationship well-being. Sexual activity. Eating habits. History of falls. Memory and ability to understand (cognition). Work and work Statistician. Reproductive health. Screening  You may have the following tests or measurements: Height, weight, and BMI. Blood pressure. Lipid and cholesterol levels. These may be checked every 5 years, or more frequently if you are over 29 years old. Skin check. Lung cancer screening. You may have this screening every year starting at age 14 if you have a 30-pack-year history of smoking and currently smoke or have quit within the past 15 years. Fecal occult blood test (FOBT) of the stool. You may have this test every year starting at age 28. Flexible sigmoidoscopy or colonoscopy. You may have a sigmoidoscopy every 5 years or a colonoscopy every 10 years starting at age 105. Hepatitis C blood test. Hepatitis B blood test. Sexually transmitted disease (STD) testing. Diabetes screening. This is done by checking your blood sugar (glucose) after you have not eaten for a while (fasting). You may have this done every 1-3 years. Bone density scan. This is done to screen for osteoporosis. You may have this done starting at age 16. Mammogram. This may be done every 1-2 years. Talk to your health care provider about how often you should have regular mammograms. Talk with your health care provider about your test results, treatment options, and if necessary, the need for more  tests. Vaccines  Your health care provider may recommend certain vaccines, such as: Influenza vaccine. This is recommended every  year. Tetanus, diphtheria, and acellular pertussis (Tdap, Td) vaccine. You may need a Td booster every 10 years. Zoster vaccine. You may need this after age 75. Pneumococcal 13-valent conjugate (PCV13) vaccine. One dose is recommended after age 16. Pneumococcal polysaccharide (PPSV23) vaccine. One dose is recommended after age 57. Talk to your health care provider about which screenings and vaccines you need and how often you need them. This information is not intended to replace advice given to you by your health care provider. Make sure you discuss any questions you have with your health care provider. Document Released: 11/10/2015 Document Revised: 07/03/2016 Document Reviewed: 08/15/2015 Elsevier Interactive Patient Education  2017 [place] Prevention in the Home Falls can cause injuries. They can happen to people of all ages. There are many things you can do to make your home safe and to help prevent falls. What can I do on the outside of my home? Regularly fix the edges of walkways and driveways and fix any cracks. Remove anything that might make you trip as you walk through a door, such as a raised step or threshold. Trim any bushes or trees on the path to your home. Use bright outdoor lighting. Clear any walking paths of anything that might make someone trip, such as rocks or tools. Regularly check to see if handrails are loose or broken. Make sure that both sides of any steps have handrails. Any raised decks and porches should have guardrails on the edges. Have any leaves, snow, or ice cleared regularly. Use sand or salt on walking paths during winter. Clean up any spills in your garage right away. This includes oil or grease spills. What can I do in the bathroom? Use night lights. Install grab bars by the toilet and in the tub and shower. Do not use towel bars as grab bars. Use non-skid mats or decals in the tub or shower. If you need to sit down in the shower, use a  plastic, non-slip stool. Keep the floor dry. Clean up any water that spills on the floor as soon as it happens. Remove soap buildup in the tub or shower regularly. Attach bath mats securely with double-sided non-slip rug tape. Do not have throw rugs and other things on the floor that can make you trip. What can I do in the bedroom? Use night lights. Make sure that you have a light by your bed that is easy to reach. Do not use any sheets or blankets that are too big for your bed. They should not hang down onto the floor. Have a firm chair that has side arms. You can use this for support while you get dressed. Do not have throw rugs and other things on the floor that can make you trip. What can I do in the kitchen? Clean up any spills right away. Avoid walking on wet floors. Keep items that you use a lot in easy-to-reach places. If you need to reach something above you, use a strong step stool that has a grab bar. Keep electrical cords out of the way. Do not use floor polish or wax that makes floors slippery. If you must use wax, use non-skid floor wax. Do not have throw rugs and other things on the floor that can make you trip. What can I do with my stairs? Do not leave any items on the stairs. Make sure  that there are handrails on both sides of the stairs and use them. Fix handrails that are broken or loose. Make sure that handrails are as long as the stairways. Check any carpeting to make sure that it is firmly attached to the stairs. Fix any carpet that is loose or worn. Avoid having throw rugs at the top or bottom of the stairs. If you do have throw rugs, attach them to the floor with carpet tape. Make sure that you have a light switch at the top of the stairs and the bottom of the stairs. If you do not have them, ask someone to add them for you. What else can I do to help prevent falls? Wear shoes that: Do not have high heels. Have rubber bottoms. Are comfortable and fit you  well. Are closed at the toe. Do not wear sandals. If you use a stepladder: Make sure that it is fully opened. Do not climb a closed stepladder. Make sure that both sides of the stepladder are locked into place. Ask someone to hold it for you, if possible. Clearly mark and make sure that you can see: Any grab bars or handrails. First and last steps. Where the edge of each step is. Use tools that help you move around (mobility aids) if they are needed. These include: Canes. Walkers. Scooters. Crutches. Turn on the lights when you go into a dark area. Replace any light bulbs as soon as they burn out. Set up your furniture so you have a clear path. Avoid moving your furniture around. If any of your floors are uneven, fix them. If there are any pets around you, be aware of where they are. Review your medicines with your doctor. Some medicines can make you feel dizzy. This can increase your chance of falling. Ask your doctor what other things that you can do to help prevent falls. This information is not intended to replace advice given to you by your health care provider. Make sure you discuss any questions you have with your health care provider. Document Released: 08/10/2009 Document Revised: 03/21/2016 Document Reviewed: 11/18/2014 Elsevier Interactive Patient Education  2017 [employer].

## 2021-12-17 ENCOUNTER — Telehealth: Payer: Self-pay

## 2021-12-17 NOTE — Chronic Care Management (AMB) (Signed)
°  Chronic Care Management   Note  12/17/2021 Name: KATHELYN GOMBOS MRN: [id number] DOB: [birth year]  [name] is a 71 y.o. year old female who is a primary care patient of Jerline Pain, Algis Greenhouse, MD. [name] is currently enrolled in care management services. An additional referral for LCSW  was placed.   Follow up plan: Telephone appointment with care management team member scheduled for:12/20/2021  Noreene Larsson, [place], [place], [place] [address] Direct Dial: [phone] [email or web address]

## 2021-12-20 ENCOUNTER — Ambulatory Visit: Payer: Medicare HMO | Admitting: Licensed Clinical Social Worker

## 2021-12-20 ENCOUNTER — Telehealth: Payer: Self-pay | Admitting: Family Medicine

## 2021-12-20 NOTE — Telephone Encounter (Signed)
See note

## 2021-12-20 NOTE — Chronic Care Management (AMB) (Signed)
° °   Clinical Social Work  Care Management   Phone Outreach    12/20/2021 Name: [name] [name] MRN: [id number] DOB: [birth year]  [name] is a 71 y.o. year old female who is a primary care patient of Vivi Barrack, MD .   Reason for referral: Mental Health Counseling and Resources.    CCM LCSW reached out to patient today by phone to introduce self, assess needs and offer Care Management services and interventions.    Patient unable to keep phone appointment today and requested to reschedule.  Plan:Appointment was rescheduled with CCM LCSW on March 2nd at 3:00  Review of patient status, including review of consultants reports, relevant laboratory and other test results, and collaboration with appropriate care team members and the patient's provider was performed as part of comprehensive patient evaluation and provision of care management services.     Casimer Lanius, LCSW Licensed Clinical Social Worker [facility]  [facility] [place] [phone]

## 2021-12-20 NOTE — Telephone Encounter (Signed)
[facility] called for patient- pt cx pt this week and will resume next week. Reason for cx: new medication. Unknown to therapist.   Therapist wanted to make dr Jerline Pain aware.

## 2021-12-20 NOTE — Patient Instructions (Addendum)
° °  It was a pleasure speaking with you today. I am sorry you were unable to keep your phone appointment today.   per your request your appointment is scheduled March 2nd at 3:00  Casimer Lanius, [place] Licensed Clinical Social Worker [facility]  [facility] [phone]

## 2021-12-27 ENCOUNTER — Telehealth: Payer: Medicare HMO

## 2021-12-31 ENCOUNTER — Telehealth: Payer: Self-pay | Admitting: Pharmacist

## 2021-12-31 NOTE — Progress Notes (Addendum)
? ? ?  Chronic Care Management ?Pharmacy Assistant  ? ?Name: [name] [name]  MRN: [id number] DOB: 12-Sep-1951 ? ?Reason for Encounter: Medication Coordination Call ?  ? ?Recent office visits:  ?None since last medication coordination call ? ?Recent consult visits:  ?11/20/2021 OV (Neurology) Murriel Hopper, MD; We will try starting extended release carbidopa/levodopa to see if this can help with symptoms with less sedating side effects ? ?Hospital visits:  ?None since last medication coordination call ? ?Medications: ?Outpatient Encounter Medications as of 12/31/2021  ?Medication Sig  ? Ascorbic Acid (VITAMIN C PO) Take 1 tablet by mouth daily.  ? b complex vitamins capsule Take 1 capsule by mouth daily.  ? busPIRone (BUSPAR) 5 MG tablet Take 1 tablet (5 mg total) by mouth 2 (two) times daily.  ? carbidopa-levodopa (SINEMET IR) 25-250 MG tablet Take 1 tablet by mouth 3 (three) times daily.  ? Cholecalciferol (VITAMIN D) 2000 UNITS CAPS Take 5,000 Units by mouth daily.  ? diazepam (VALIUM) 2 MG tablet Take 0.5 tablets (1 mg total) by mouth every 12 (twelve) hours as needed (vertigo).  ? fluticasone (FLONASE) 50 MCG/ACT nasal spray Place 2 sprays into both nostrils daily.  ? glucosamine-chondroitin 500-400 MG tablet Take 1 tablet by mouth daily.  ? ibuprofen (ADVIL,MOTRIN) 200 MG tablet Take 400 mg by mouth every 6 (six) hours as needed for moderate pain.   ? Magnesium Hydroxide (MAGNESIA PO) Take 1 tablet by mouth daily.  ? Multiple Vitamin (MULTIVITAMIN) capsule Take 1 capsule by mouth daily.  ? Omega-3 Fatty Acids (OMEGA 3 PO) Take 1 tablet by mouth daily.  ? tetrahydrozoline-zinc (VISINE-AC) 0.05-0.25 % ophthalmic solution Place 2 drops into both eyes 4 (four) times daily as needed (allergies).  ? vitamin B-12 (CYANOCOBALAMIN) 1000 MCG tablet Take by mouth.  ? [DISCONTINUED] amantadine (SYMMETREL) 100 MG capsule Take 1 capsule (100 mg total) by mouth 2 (two) times daily. (Patient not taking: Reported on 09/21/2019)   ? ?No facility-administered encounter medications on file as of 12/31/2021.  ? ?Reviewed chart for medication changes ahead of medication coordination call. ? ?BP Readings from Last 3 Encounters:  ?09/25/21 107/73  ?03/09/21 108/74  ?03/02/21 122/88  ?  ?Lab Results  ?Component Value Date  ? HGBA1C 5.8 09/25/2021  ?  ? ?Patient obtains medications through Vials  90 Days  ? ?Last adherence delivery included:  ?Buspirone 5 mg twice daily ?Carbidopa-Levodopa 25-250 mg three times daily ?Diazepam 2 mg  1/2 tablet q12hrs prn ? ?Patient is due for next adherence delivery on: 01/10/2022. ?Called patient and reviewed medications and coordinated delivery. ? ?This delivery to include: ?Buspirone 5 mg twice daily ?Carbidopa-Levodopa 25-100 mg three times daily ?Diazepam 2 mg  1/2 tablet q12hrs prn ? ?Patient needs refills for Buspirone. ?-Rx refill request sent to [facility]. ? ?Confirmed delivery date of 01/10/2022, advised patient that pharmacy will contact them the morning of delivery.  ? ?Care Gaps: ?Medicare Annual Wellness: Completed 12/14/2021 ?Hemoglobin A1C: 5.8% on 09/25/2021 ?Colonoscopy: Overdue since 01/10/2016 ?Dexa Scan: Completed ?Mammogram: Next due on 07/12/2023 ? ?Future Appointments  ?Date Time Provider [place]  ?12/27/2022  3:15 PM [facility]  ? ? ?April D Calhoun, [place] ?Clinical Pharmacist Assistant ?[phone]  ? ?7 minutes spent in review, coordination, and documentation. ? ?Reviewed by: ?Beverly Milch, PharmD ?Clinical Pharmacist ?[phone] ? ?

## 2022-01-04 ENCOUNTER — Other Ambulatory Visit: Payer: Self-pay | Admitting: Family Medicine

## 2022-01-15 ENCOUNTER — Telehealth: Payer: Self-pay | Admitting: Family Medicine

## 2022-01-15 NOTE — Telephone Encounter (Signed)
Pt's daughter called requesting a PA for home health services. She is asking for a call back to go over the request. Please advise ?

## 2022-01-16 NOTE — Telephone Encounter (Signed)
Ok to please order per Dr Jerline Pain ?

## 2022-01-17 ENCOUNTER — Inpatient Hospital Stay (HOSPITAL_COMMUNITY): Payer: Medicare HMO

## 2022-01-17 ENCOUNTER — Encounter (HOSPITAL_COMMUNITY): Admission: EM | Disposition: A | Discharge status: Skilled Nursing Facility | Payer: Self-pay | Source: Home / Self Care

## 2022-01-17 ENCOUNTER — Other Ambulatory Visit: Payer: Self-pay

## 2022-01-17 ENCOUNTER — Inpatient Hospital Stay (HOSPITAL_BASED_OUTPATIENT_CLINIC_OR_DEPARTMENT_OTHER)
Admission: EM | Admit: 2022-01-17 | Discharge: 2022-02-14 | DRG: 326 | Disposition: A | Discharge status: Skilled Nursing Facility | Payer: Medicare HMO | Attending: General Surgery | Admitting: General Surgery

## 2022-01-17 ENCOUNTER — Ambulatory Visit (INDEPENDENT_AMBULATORY_CARE_PROVIDER_SITE_OTHER): Payer: Medicare HMO | Admitting: Licensed Clinical Social Worker

## 2022-01-17 ENCOUNTER — Encounter (HOSPITAL_BASED_OUTPATIENT_CLINIC_OR_DEPARTMENT_OTHER): Payer: Self-pay | Admitting: Obstetrics and Gynecology

## 2022-01-17 ENCOUNTER — Ambulatory Visit (HOSPITAL_COMMUNITY): Payer: Medicare HMO | Admitting: Anesthesiology

## 2022-01-17 ENCOUNTER — Emergency Department (HOSPITAL_BASED_OUTPATIENT_CLINIC_OR_DEPARTMENT_OTHER): Payer: Medicare HMO

## 2022-01-17 ENCOUNTER — Encounter: Payer: Self-pay | Admitting: Licensed Clinical Social Worker

## 2022-01-17 DIAGNOSIS — K632 Fistula of intestine: Secondary | ICD-10-CM | POA: Diagnosis not present

## 2022-01-17 DIAGNOSIS — J150 Pneumonia due to Klebsiella pneumoniae: Secondary | ICD-10-CM | POA: Diagnosis not present

## 2022-01-17 DIAGNOSIS — K55029 Acute infarction of small intestine, extent unspecified: Secondary | ICD-10-CM | POA: Diagnosis present

## 2022-01-17 DIAGNOSIS — E43 Unspecified severe protein-calorie malnutrition: Secondary | ICD-10-CM | POA: Diagnosis present

## 2022-01-17 DIAGNOSIS — K5989 Other specified functional intestinal disorders: Secondary | ICD-10-CM | POA: Diagnosis not present

## 2022-01-17 DIAGNOSIS — R54 Age-related physical debility: Secondary | ICD-10-CM | POA: Diagnosis present

## 2022-01-17 DIAGNOSIS — K4140 Unilateral femoral hernia, with gangrene, not specified as recurrent: Principal | ICD-10-CM | POA: Diagnosis present

## 2022-01-17 DIAGNOSIS — D1803 Hemangioma of intra-abdominal structures: Secondary | ICD-10-CM | POA: Diagnosis present

## 2022-01-17 DIAGNOSIS — K559 Vascular disorder of intestine, unspecified: Secondary | ICD-10-CM | POA: Diagnosis not present

## 2022-01-17 DIAGNOSIS — Z9889 Other specified postprocedural states: Secondary | ICD-10-CM | POA: Diagnosis not present

## 2022-01-17 DIAGNOSIS — R64 Cachexia: Secondary | ICD-10-CM | POA: Diagnosis present

## 2022-01-17 DIAGNOSIS — J9601 Acute respiratory failure with hypoxia: Secondary | ICD-10-CM | POA: Diagnosis not present

## 2022-01-17 DIAGNOSIS — Z882 Allergy status to sulfonamides status: Secondary | ICD-10-CM

## 2022-01-17 DIAGNOSIS — A419 Sepsis, unspecified organism: Secondary | ICD-10-CM

## 2022-01-17 DIAGNOSIS — K5909 Other constipation: Secondary | ICD-10-CM | POA: Diagnosis present

## 2022-01-17 DIAGNOSIS — Z83438 Family history of other disorder of lipoprotein metabolism and other lipidemia: Secondary | ICD-10-CM

## 2022-01-17 DIAGNOSIS — Z681 Body mass index (BMI) 19 or less, adult: Secondary | ICD-10-CM

## 2022-01-17 DIAGNOSIS — K56609 Unspecified intestinal obstruction, unspecified as to partial versus complete obstruction: Secondary | ICD-10-CM

## 2022-01-17 DIAGNOSIS — F419 Anxiety disorder, unspecified: Secondary | ICD-10-CM

## 2022-01-17 DIAGNOSIS — K460 Unspecified abdominal hernia with obstruction, without gangrene: Secondary | ICD-10-CM | POA: Diagnosis not present

## 2022-01-17 DIAGNOSIS — R531 Weakness: Secondary | ICD-10-CM | POA: Diagnosis not present

## 2022-01-17 DIAGNOSIS — R627 Adult failure to thrive: Secondary | ICD-10-CM | POA: Diagnosis not present

## 2022-01-17 DIAGNOSIS — R6521 Severe sepsis with septic shock: Secondary | ICD-10-CM | POA: Diagnosis not present

## 2022-01-17 DIAGNOSIS — E860 Dehydration: Secondary | ICD-10-CM | POA: Diagnosis present

## 2022-01-17 DIAGNOSIS — D649 Anemia, unspecified: Secondary | ICD-10-CM | POA: Diagnosis present

## 2022-01-17 DIAGNOSIS — Z7401 Bed confinement status: Secondary | ICD-10-CM | POA: Diagnosis not present

## 2022-01-17 DIAGNOSIS — A4159 Other Gram-negative sepsis: Secondary | ICD-10-CM | POA: Diagnosis not present

## 2022-01-17 DIAGNOSIS — Z8249 Family history of ischemic heart disease and other diseases of the circulatory system: Secondary | ICD-10-CM

## 2022-01-17 DIAGNOSIS — R079 Chest pain, unspecified: Secondary | ICD-10-CM | POA: Diagnosis not present

## 2022-01-17 DIAGNOSIS — E871 Hypo-osmolality and hyponatremia: Secondary | ICD-10-CM | POA: Diagnosis not present

## 2022-01-17 DIAGNOSIS — K432 Incisional hernia without obstruction or gangrene: Secondary | ICD-10-CM | POA: Diagnosis not present

## 2022-01-17 DIAGNOSIS — R278 Other lack of coordination: Secondary | ICD-10-CM | POA: Diagnosis not present

## 2022-01-17 DIAGNOSIS — L89152 Pressure ulcer of sacral region, stage 2: Secondary | ICD-10-CM | POA: Diagnosis not present

## 2022-01-17 DIAGNOSIS — Z7189 Other specified counseling: Secondary | ICD-10-CM | POA: Diagnosis not present

## 2022-01-17 DIAGNOSIS — J449 Chronic obstructive pulmonary disease, unspecified: Secondary | ICD-10-CM | POA: Diagnosis not present

## 2022-01-17 DIAGNOSIS — R0781 Pleurodynia: Secondary | ICD-10-CM | POA: Diagnosis not present

## 2022-01-17 DIAGNOSIS — F32A Depression, unspecified: Secondary | ICD-10-CM | POA: Diagnosis present

## 2022-01-17 DIAGNOSIS — Z8261 Family history of arthritis: Secondary | ICD-10-CM

## 2022-01-17 DIAGNOSIS — E872 Acidosis, unspecified: Secondary | ICD-10-CM | POA: Diagnosis not present

## 2022-01-17 DIAGNOSIS — J189 Pneumonia, unspecified organism: Secondary | ICD-10-CM | POA: Diagnosis not present

## 2022-01-17 DIAGNOSIS — K3189 Other diseases of stomach and duodenum: Secondary | ICD-10-CM | POA: Diagnosis not present

## 2022-01-17 DIAGNOSIS — K567 Ileus, unspecified: Secondary | ICD-10-CM | POA: Diagnosis not present

## 2022-01-17 DIAGNOSIS — J690 Pneumonitis due to inhalation of food and vomit: Secondary | ICD-10-CM | POA: Diagnosis not present

## 2022-01-17 DIAGNOSIS — J9690 Respiratory failure, unspecified, unspecified whether with hypoxia or hypercapnia: Secondary | ICD-10-CM | POA: Diagnosis not present

## 2022-01-17 DIAGNOSIS — R000 Tachycardia, unspecified: Secondary | ICD-10-CM | POA: Diagnosis not present

## 2022-01-17 DIAGNOSIS — G4700 Insomnia, unspecified: Secondary | ICD-10-CM | POA: Diagnosis present

## 2022-01-17 DIAGNOSIS — K4090 Unilateral inguinal hernia, without obstruction or gangrene, not specified as recurrent: Secondary | ICD-10-CM | POA: Diagnosis not present

## 2022-01-17 DIAGNOSIS — K4130 Unilateral femoral hernia, with obstruction, without gangrene, not specified as recurrent: Secondary | ICD-10-CM | POA: Diagnosis not present

## 2022-01-17 DIAGNOSIS — Z20822 Contact with and (suspected) exposure to covid-19: Secondary | ICD-10-CM | POA: Diagnosis not present

## 2022-01-17 DIAGNOSIS — M159 Polyosteoarthritis, unspecified: Secondary | ICD-10-CM

## 2022-01-17 DIAGNOSIS — R109 Unspecified abdominal pain: Secondary | ICD-10-CM | POA: Diagnosis not present

## 2022-01-17 DIAGNOSIS — K9189 Other postprocedural complications and disorders of digestive system: Secondary | ICD-10-CM | POA: Diagnosis not present

## 2022-01-17 DIAGNOSIS — G20 Parkinson's disease: Secondary | ICD-10-CM | POA: Diagnosis present

## 2022-01-17 DIAGNOSIS — J9811 Atelectasis: Secondary | ICD-10-CM | POA: Diagnosis not present

## 2022-01-17 DIAGNOSIS — Z79899 Other long term (current) drug therapy: Secondary | ICD-10-CM

## 2022-01-17 DIAGNOSIS — R7989 Other specified abnormal findings of blood chemistry: Secondary | ICD-10-CM | POA: Diagnosis not present

## 2022-01-17 DIAGNOSIS — N3289 Other specified disorders of bladder: Secondary | ICD-10-CM | POA: Diagnosis not present

## 2022-01-17 DIAGNOSIS — R339 Retention of urine, unspecified: Secondary | ICD-10-CM | POA: Diagnosis not present

## 2022-01-17 DIAGNOSIS — K5939 Other megacolon: Secondary | ICD-10-CM | POA: Diagnosis not present

## 2022-01-17 DIAGNOSIS — M6281 Muscle weakness (generalized): Secondary | ICD-10-CM | POA: Diagnosis not present

## 2022-01-17 DIAGNOSIS — Y836 Removal of other organ (partial) (total) as the cause of abnormal reaction of the patient, or of later complication, without mention of misadventure at the time of the procedure: Secondary | ICD-10-CM | POA: Diagnosis not present

## 2022-01-17 DIAGNOSIS — L89151 Pressure ulcer of sacral region, stage 1: Secondary | ICD-10-CM | POA: Diagnosis not present

## 2022-01-17 DIAGNOSIS — Z803 Family history of malignant neoplasm of breast: Secondary | ICD-10-CM

## 2022-01-17 DIAGNOSIS — G20A1 Parkinson's disease without dyskinesia, without mention of fluctuations: Secondary | ICD-10-CM

## 2022-01-17 DIAGNOSIS — R0600 Dyspnea, unspecified: Secondary | ICD-10-CM | POA: Diagnosis not present

## 2022-01-17 DIAGNOSIS — M150 Primary generalized (osteo)arthritis: Secondary | ICD-10-CM

## 2022-01-17 DIAGNOSIS — R1312 Dysphagia, oropharyngeal phase: Secondary | ICD-10-CM | POA: Diagnosis not present

## 2022-01-17 DIAGNOSIS — J90 Pleural effusion, not elsewhere classified: Secondary | ICD-10-CM | POA: Diagnosis not present

## 2022-01-17 DIAGNOSIS — R2689 Other abnormalities of gait and mobility: Secondary | ICD-10-CM | POA: Diagnosis not present

## 2022-01-17 DIAGNOSIS — J984 Other disorders of lung: Secondary | ICD-10-CM | POA: Diagnosis not present

## 2022-01-17 DIAGNOSIS — I4891 Unspecified atrial fibrillation: Secondary | ICD-10-CM | POA: Diagnosis not present

## 2022-01-17 DIAGNOSIS — R1110 Vomiting, unspecified: Secondary | ICD-10-CM | POA: Diagnosis not present

## 2022-01-17 DIAGNOSIS — R0989 Other specified symptoms and signs involving the circulatory and respiratory systems: Secondary | ICD-10-CM | POA: Diagnosis not present

## 2022-01-17 DIAGNOSIS — Z4682 Encounter for fitting and adjustment of non-vascular catheter: Secondary | ICD-10-CM | POA: Diagnosis not present

## 2022-01-17 DIAGNOSIS — J383 Other diseases of vocal cords: Secondary | ICD-10-CM | POA: Diagnosis present

## 2022-01-17 DIAGNOSIS — N281 Cyst of kidney, acquired: Secondary | ICD-10-CM | POA: Diagnosis not present

## 2022-01-17 DIAGNOSIS — K6389 Other specified diseases of intestine: Secondary | ICD-10-CM | POA: Diagnosis not present

## 2022-01-17 DIAGNOSIS — D259 Leiomyoma of uterus, unspecified: Secondary | ICD-10-CM | POA: Diagnosis not present

## 2022-01-17 HISTORY — PX: INGUINAL HERNIA REPAIR: SHX194

## 2022-01-17 LAB — RESP PANEL BY RT-PCR (FLU A&B, COVID) ARPGX2
Influenza A by PCR: NEGATIVE
Influenza B by PCR: NEGATIVE
SARS Coronavirus 2 by RT PCR: NEGATIVE

## 2022-01-17 LAB — COMPREHENSIVE METABOLIC PANEL
ALT: <5 U/L (ref 0–44)
AST: 14 U/L — ABNORMAL LOW (ref 15–41)
Albumin: 4.3 g/dL (ref 3.5–5.0)
Alkaline Phosphatase: 41 U/L (ref 38–126)
Anion gap: 11 (ref 5–15)
BUN: 18 mg/dL (ref 8–23)
CO2: 27 mmol/L (ref 22–32)
Calcium: 10.4 mg/dL — ABNORMAL HIGH (ref 8.9–10.3)
Chloride: 100 mmol/L (ref 98–111)
Creatinine, Ser: 0.63 mg/dL (ref 0.44–1.00)
GFR, Estimated: >60 mL/min (ref >60)
Glucose, Bld: 105 mg/dL — ABNORMAL HIGH (ref 70–99)
Potassium: 3.5 mmol/L (ref 3.5–5.1)
Sodium: 138 mmol/L (ref 135–145)
Total Bilirubin: 1 mg/dL (ref 0.3–1.2)
Total Protein: 7.4 g/dL (ref 6.5–8.1)

## 2022-01-17 LAB — CBC
HCT: 42.5 % (ref 36.0–46.0)
Hemoglobin: 13.9 g/dL (ref 12.0–15.0)
MCH: 29.1 pg (ref 26.0–34.0)
MCHC: 32.7 g/dL (ref 30.0–36.0)
MCV: 88.9 fL (ref 80.0–100.0)
Platelets: 183 10*3/uL (ref 150–400)
RBC: 4.78 MIL/uL (ref 3.87–5.11)
RDW: 13.2 % (ref 11.5–15.5)
WBC: 15.6 10*3/uL — ABNORMAL HIGH (ref 4.0–10.5)
nRBC: 0 % (ref 0.0–0.2)

## 2022-01-17 LAB — LIPASE, BLOOD: Lipase: <10 U/L — ABNORMAL LOW (ref 11–51)

## 2022-01-17 SURGERY — REPAIR, HERNIA, INGUINAL, ADULT
Anesthesia: General | Site: Abdomen | Laterality: Left

## 2022-01-17 MED ORDER — LACTATED RINGERS IV SOLN: INTRAVENOUS | Status: DC | PRN

## 2022-01-17 MED ORDER — BUSPIRONE HCL 5 MG PO TABS
5.0000 mg | ORAL_TABLET | Freq: Two times a day (BID) | ORAL | Status: DC
  Filled 2022-01-17: qty 1

## 2022-01-17 MED ORDER — DEXAMETHASONE SODIUM PHOSPHATE 4 MG/ML IJ SOLN
INTRAMUSCULAR | Status: DC | PRN
  Administered 2022-01-17: 5 mg via INTRAVENOUS

## 2022-01-17 MED ORDER — LIDOCAINE HCL (CARDIAC) PF 50 MG/5ML IV SOSY
PREFILLED_SYRINGE | INTRAVENOUS | Status: DC | PRN
Start: 2022-01-17 — End: 2022-01-17
  Administered 2022-01-17: 60 mg via INTRAVENOUS

## 2022-01-17 MED ORDER — CEFAZOLIN SODIUM-DEXTROSE 2-3 GM-%(50ML) IV SOLR
INTRAVENOUS | Status: DC | PRN
Start: 2022-01-17 — End: 2022-01-17
  Administered 2022-01-17: 2 g via INTRAVENOUS

## 2022-01-17 MED ORDER — METOPROLOL TARTRATE 5 MG/5ML IV SOLN
5.0000 mg | Freq: Four times a day (QID) | INTRAVENOUS | Status: AC | PRN
  Administered 2022-01-24 – 2022-01-27 (×4): 5 mg via INTRAVENOUS
  Filled 2022-01-17 (×4): qty 5

## 2022-01-17 MED ORDER — ONDANSETRON 4 MG PO TBDP
4.0000 mg | ORAL_TABLET | Freq: Four times a day (QID) | ORAL | Status: DC | PRN
Start: 2022-01-17 — End: 2022-02-14
  Filled 2022-01-17: qty 1

## 2022-01-17 MED ORDER — PROPOFOL 10 MG/ML IV BOLUS
INTRAVENOUS | Status: DC | PRN
  Administered 2022-01-17: 70 mg via INTRAVENOUS

## 2022-01-17 MED ORDER — ACETAMINOPHEN 10 MG/ML IV SOLN
INTRAVENOUS | Status: DC | PRN
Start: 2022-01-17 — End: 2022-01-17
  Administered 2022-01-17: 1000 mg via INTRAVENOUS

## 2022-01-17 MED ORDER — SODIUM CHLORIDE 0.9 % IV BOLUS
1000.0000 mL | Freq: Once | INTRAVENOUS | Status: AC
  Administered 2022-01-17: 1000 mL via INTRAVENOUS

## 2022-01-17 MED ORDER — ROCURONIUM BROMIDE 100 MG/10ML IV SOLN
INTRAVENOUS | Status: DC | PRN
  Administered 2022-01-17: 25 mg via INTRAVENOUS
  Administered 2022-01-17: 10 mg via INTRAVENOUS

## 2022-01-17 MED ORDER — ACETAMINOPHEN 10 MG/ML IV SOLN
INTRAVENOUS | Status: AC
  Filled 2022-01-17: qty 100

## 2022-01-17 MED ORDER — PHENYLEPHRINE HCL (PRESSORS) 10 MG/ML IV SOLN
INTRAVENOUS | Status: AC
  Filled 2022-01-17: qty 1

## 2022-01-17 MED ORDER — IOHEXOL 300 MG/ML  SOLN
100.0000 mL | Freq: Once | INTRAMUSCULAR | Status: AC | PRN
Start: 2022-01-17 — End: 2022-01-17
  Administered 2022-01-17: 70 mL via INTRAVENOUS

## 2022-01-17 MED ORDER — BUPIVACAINE-EPINEPHRINE (PF) 0.25% -1:200000 IJ SOLN
INTRAMUSCULAR | Status: DC | PRN
  Administered 2022-01-17: 30 mL

## 2022-01-17 MED ORDER — ACETAMINOPHEN 650 MG RE SUPP: 650.0000 mg | Freq: Four times a day (QID) | RECTAL | Status: DC | PRN

## 2022-01-17 MED ORDER — FENTANYL CITRATE (PF) 250 MCG/5ML IJ SOLN
INTRAMUSCULAR | Status: AC
  Filled 2022-01-17: qty 5

## 2022-01-17 MED ORDER — ASCORBIC ACID 500 MG PO TABS
500.0000 mg | ORAL_TABLET | Freq: Every day | ORAL | Status: DC
  Administered 2022-01-18 – 2022-01-25 (×8): 500 mg via ORAL
  Filled 2022-01-17 (×9): qty 1

## 2022-01-17 MED ORDER — OXYCODONE HCL 5 MG PO TABS
5.0000 mg | ORAL_TABLET | ORAL | Status: DC | PRN
  Filled 2022-01-17: qty 1

## 2022-01-17 MED ORDER — 0.9 % SODIUM CHLORIDE (POUR BTL) OPTIME
TOPICAL | Status: DC | PRN
  Administered 2022-01-17: 1000 mL

## 2022-01-17 MED ORDER — ONDANSETRON HCL 4 MG/2ML IJ SOLN
4.0000 mg | Freq: Four times a day (QID) | INTRAMUSCULAR | Status: DC | PRN
  Administered 2022-01-25 – 2022-02-12 (×3): 4 mg via INTRAVENOUS
  Filled 2022-01-17 (×3): qty 2

## 2022-01-17 MED ORDER — MIDAZOLAM HCL 5 MG/5ML IJ SOLN
INTRAMUSCULAR | Status: DC | PRN
  Administered 2022-01-17 (×2): .5 mg via INTRAVENOUS

## 2022-01-17 MED ORDER — FENTANYL CITRATE (PF) 100 MCG/2ML IJ SOLN: 25.0000 ug | INTRAMUSCULAR | Status: DC | PRN

## 2022-01-17 MED ORDER — MORPHINE SULFATE (PF) 2 MG/ML IV SOLN: 2.0000 mg | INTRAVENOUS | Status: DC | PRN

## 2022-01-17 MED ORDER — DEXTROSE-NACL 5-0.45 % IV SOLN: INTRAVENOUS | Status: AC

## 2022-01-17 MED ORDER — PHENYLEPHRINE HCL-NACL 20-0.9 MG/250ML-% IV SOLN
INTRAVENOUS | Status: DC | PRN
Start: 2022-01-17 — End: 2022-01-17
  Administered 2022-01-17: 50 ug/min via INTRAVENOUS

## 2022-01-17 MED ORDER — FENTANYL CITRATE (PF) 100 MCG/2ML IJ SOLN
INTRAMUSCULAR | Status: DC | PRN
  Administered 2022-01-17: 50 ug via INTRAVENOUS
  Administered 2022-01-17: 25 ug via INTRAVENOUS

## 2022-01-17 MED ORDER — SUGAMMADEX SODIUM 200 MG/2ML IV SOLN
INTRAVENOUS | Status: DC | PRN
  Administered 2022-01-17: 100 mg via INTRAVENOUS

## 2022-01-17 MED ORDER — ENOXAPARIN SODIUM 30 MG/0.3ML IJ SOSY
30.0000 mg | PREFILLED_SYRINGE | INTRAMUSCULAR | Status: DC
  Administered 2022-01-18 – 2022-01-27 (×10): 30 mg via SUBCUTANEOUS
  Filled 2022-01-17 (×11): qty 0.3

## 2022-01-17 MED ORDER — SUCCINYLCHOLINE CHLORIDE 200 MG/10ML IV SOSY
PREFILLED_SYRINGE | INTRAVENOUS | Status: DC | PRN
Start: 2022-01-17 — End: 2022-01-17
  Administered 2022-01-17: 80 mg via INTRAVENOUS

## 2022-01-17 MED ORDER — ONDANSETRON HCL 4 MG/2ML IJ SOLN
INTRAMUSCULAR | Status: DC | PRN
  Administered 2022-01-17: 4 mg via INTRAVENOUS

## 2022-01-17 MED ORDER — PROPOFOL 10 MG/ML IV BOLUS
INTRAVENOUS | Status: AC
  Filled 2022-01-17: qty 20

## 2022-01-17 MED ORDER — MIDAZOLAM HCL 2 MG/2ML IJ SOLN
INTRAMUSCULAR | Status: AC
  Filled 2022-01-17: qty 2

## 2022-01-17 MED ORDER — CARBIDOPA-LEVODOPA 25-250 MG PO TABS
1.0000 | ORAL_TABLET | Freq: Three times a day (TID) | ORAL | Status: DC
  Administered 2022-01-18 – 2022-01-25 (×19): 1 via ORAL
  Filled 2022-01-17 (×31): qty 1

## 2022-01-17 MED ORDER — ACETAMINOPHEN 325 MG PO TABS
650.0000 mg | ORAL_TABLET | Freq: Four times a day (QID) | ORAL | Status: DC | PRN
  Administered 2022-01-19 – 2022-01-25 (×9): 650 mg via ORAL
  Filled 2022-01-17 (×13): qty 2

## 2022-01-17 MED ORDER — HEMOSTATIC AGENTS (NO CHARGE) OPTIME
TOPICAL | Status: DC | PRN
Start: 2022-01-17 — End: 2022-01-17
  Administered 2022-01-17: 1 via TOPICAL

## 2022-01-17 SURGICAL SUPPLY — 61 items
ADH SKN CLS LQ APL DERMABOND (GAUZE/BANDAGES/DRESSINGS) ×1
APL PRP STRL LF DISP 70% ISPRP (MISCELLANEOUS) ×1
APL SKNCLS STERI-STRIP NONHPOA (GAUZE/BANDAGES/DRESSINGS)
BAG COUNTER SPONGE SURGICOUNT (BAG) ×3 IMPLANT
BAG SPNG CNTER NS LX DISP (BAG) ×1
BENZOIN TINCTURE PRP APPL 2/3 (GAUZE/BANDAGES/DRESSINGS) IMPLANT
BLADE CLIPPER SURG (BLADE) IMPLANT
CANISTER SUCT 3000ML PPV (MISCELLANEOUS) ×3 IMPLANT
CELLS DAT CNTRL 66122 CELL SVR (MISCELLANEOUS) IMPLANT
CHLORAPREP W/TINT 26 (MISCELLANEOUS) ×3 IMPLANT
CNTNR URN SCR LID CUP LEK RST (MISCELLANEOUS) IMPLANT
CONT SPEC 4OZ STRL OR WHT (MISCELLANEOUS) ×2
COVER SURGICAL LIGHT HANDLE (MISCELLANEOUS) IMPLANT
DERMABOND ADHESIVE PROPEN (GAUZE/BANDAGES/DRESSINGS) ×1
DERMABOND ADVANCED .7 DNX6 (GAUZE/BANDAGES/DRESSINGS) IMPLANT
DRAIN PENROSE 1/4X12 LTX STRL (WOUND CARE) IMPLANT
DRAPE LAPAROTOMY TRNSV 102X78 (DRAPES) ×3 IMPLANT
DRSG COVADERM 4X6 (GAUZE/BANDAGES/DRESSINGS) ×2 IMPLANT
ELECT CAUTERY BLADE 6.4 (BLADE) ×3 IMPLANT
ELECT REM PT RETURN 9FT ADLT (ELECTROSURGICAL) ×2
ELECTRODE REM PT RTRN 9FT ADLT (ELECTROSURGICAL) ×2 IMPLANT
GAUZE SPONGE 4X4 12PLY STRL (GAUZE/BANDAGES/DRESSINGS) ×2 IMPLANT
GLOVE SURG POLYISO LF SZ7 (GLOVE) ×3 IMPLANT
GLOVE SURG UNDER POLY LF SZ7 (GLOVE) ×3 IMPLANT
GOWN STRL REUS W/ TWL LRG LVL3 (GOWN DISPOSABLE) ×4 IMPLANT
GOWN STRL REUS W/TWL LRG LVL3 (GOWN DISPOSABLE) ×4
HEMOSTAT SNOW SURGICEL 2X4 (HEMOSTASIS) ×1 IMPLANT
KIT BASIN OR (CUSTOM PROCEDURE TRAY) ×3 IMPLANT
KIT TURNOVER KIT B (KITS) ×3 IMPLANT
MESH PHASIX ST 10X15 (Mesh General) ×1 IMPLANT
NDL HYPO 25GX1X1/2 BEV (NEEDLE) IMPLANT
NEEDLE 22X1 1/2 (OR ONLY) (NEEDLE) ×3 IMPLANT
NEEDLE HYPO 25GX1X1/2 BEV (NEEDLE) ×2 IMPLANT
NS IRRIG 1000ML POUR BTL (IV SOLUTION) ×3 IMPLANT
PACK GENERAL/GYN (CUSTOM PROCEDURE TRAY) ×3 IMPLANT
PAD ARMBOARD 7.5X6 YLW CONV (MISCELLANEOUS) ×3 IMPLANT
PENCIL SMOKE EVACUATOR (MISCELLANEOUS) ×2 IMPLANT
RETRACTOR WND ALEXIS 18 MED (MISCELLANEOUS) IMPLANT
RTRCTR WOUND ALEXIS 18CM MED (MISCELLANEOUS)
RTRCTR WOUND ALEXIS 18CM SML (INSTRUMENTS)
SAVER CELL AAL HAEMONETICS (INSTRUMENTS) IMPLANT
SPONGE T-LAP 18X18 ~~LOC~~+RFID (SPONGE) ×1 IMPLANT
STAPLER GUN LINEAR PROX 60 (STAPLE) ×1 IMPLANT
STAPLER PROXIMATE 75MM BLUE (STAPLE) ×1 IMPLANT
STRIP CLOSURE SKIN 1/2X4 (GAUZE/BANDAGES/DRESSINGS) IMPLANT
SUT MNCRL AB 4-0 PS2 18 (SUTURE) ×3 IMPLANT
SUT PDS AB 0 CT1 27 (SUTURE) ×2 IMPLANT
SUT PDS AB 2-0 CT2 27 (SUTURE) ×2 IMPLANT
SUT PROLENE 2 0 CT2 30 (SUTURE) ×6 IMPLANT
SUT SILK 2 0 (SUTURE) ×2
SUT SILK 2-0 18XBRD TIE 12 (SUTURE) IMPLANT
SUT SILK 3 0 SH CR/8 (SUTURE) ×1 IMPLANT
SUT VIC AB 2-0 SH 27 (SUTURE)
SUT VIC AB 2-0 SH 27X BRD (SUTURE) IMPLANT
SUT VIC AB 3-0 SH 18 (SUTURE) ×4 IMPLANT
SUT VIC AB 3-0 SH 27 (SUTURE)
SUT VIC AB 3-0 SH 27XBRD (SUTURE) ×2 IMPLANT
SYR CONTROL 10ML LL (SYRINGE) ×1 IMPLANT
TOWEL GREEN STERILE (TOWEL DISPOSABLE) ×3 IMPLANT
TOWEL GREEN STERILE FF (TOWEL DISPOSABLE) ×3 IMPLANT
TRAY FOLEY W/BAG SLVR 14FR (SET/KITS/TRAYS/PACK) ×1 IMPLANT

## 2022-01-17 NOTE — Op Note (Signed)
Preoperative diagnosis: left inguinal hernia with obstruction ? ?Postoperative diagnosis: left femoral hernia with obstruction and gangrene ? ?Procedure: open femoral hernia repair with absorbable mesh, small bowel resection with anastomosis ? ?Surgeon: Gurney Maxin, M.D. ? ?Asst: none ? ?Anesthesia: general ? ?Indications for procedure: [name] [name] is a 71 y.o. year old female with symptoms of abdominal pain and vomiting. CT confirmed obstructing hernia and she was brought emergently to the operating room. ? ?Description of procedure: The patient was brought into the operative suite. Anesthesia was administered with General endotracheal anesthesia. WHO checklist was applied. The patient was then placed in supine position. The area was prepped and draped in the usual sterile fashion. ? ?Next, an incision was made over the left groin. Cautery was used to dissect down through the subcutaneous tissue and the external oblique was opened. There was no large hernia and it was determined to be femoral in nautre. The inguinal floor was opened and the hernia wa sidentified. Next, the hernia sac was opened and a large amount of old blood removed. There was a hemorrhagic piece of small intestine that was reduced under the inguinal ligament. The piece of intestine appeared ischemic and dead and did not pink back up after multiple minutes of observation. ? ?The portion of intestine was removed with Barcelona resection with 75 mm blue load stapler for anastomosis and 60 mm TX stapler to remove the intestine and enterotomy. 3-0 silks were used to imbricate the stapled edges. The small intestine was reduced into the peritoneal cavity. ? ?The hernia sac was removed and the peritoneum closed with 3-0 vicryl. Surgicel was packed into the pelvic space for hemostasis. The hernia was then repaired using a Phasix ST mesh. The mesh was sutured to Cooper's ligament with a running 2-0 PDS and then transitioned to the inguinal ligament  before the femoral vein. The mesh was then sutured to the conjoint tendon with 2-0 PDS in running fashion. The external oblique was closed with interrupted 3-0 vicryl. The scarpa's fascia was re-approximated with 3-0 vicryl. The skin was closed with 4-0 monocryl in running fashion. ? ?Findings: femoral hernia with obstructed and gangrene small intestine ? ?Specimen: small intestine  ? ?Implant: Phasix ST  ? ?Blood loss: 50 ml ? ?Local anesthesia:  30 ml marcaine  ? ?Complications: none ? ?Gurney Maxin, M.D. ?General, Bariatric, & Minimally Invasive Surgery ?[facility], [place] ? ?

## 2022-01-17 NOTE — Patient Instructions (Signed)
Visit Information  ? ?Thank you for taking time to visit with me today. Please don't hesitate to contact me if I can be of assistance to you before our next scheduled telephone appointment. ? ?Following are the goals we discussed today: managing your anxiety ? ?Amber will call you to schedule a follow up call with me ?Please call the care guide team at [phone] if you need to cancel or reschedule your appointment.  ? ?If you are experiencing a Mental Health or Behavioral Health Crisis or need someone to talk to, please call 1-800-273-TALK (toll free, 24 hour hotline)  ? ?Following is a copy of your full care plan:  ?Care Plan : LCSW Care Plan  ?Updates made by ,  H, LCSW since 01/17/2022 12:00 AM  ?  ? ?Problem: Symptoms of Anxiety   ?  ? ?Goal: Anxiety Symptoms Identified and managed by connecting with therapy   ?Start Date: 01/17/2022  ?This Visit's Progress: On track  ?Priority: High  ?Note:   ?Current Barriers:  ?Disease Management support and education needs related to symptoms of Anxiety ? ?CSW Clinical Goal(s):  ?Patient  will demonstrate a reduction in symptoms related to :symptoms of anxiety  through collaboration with Clinical Social Worker, provider, and care team.  ? ?Interventions: ?1:1 collaboration with primary care provider regarding development and update of comprehensive plan of care as evidenced by provider attestation and co-signature ?Inter-disciplinary care team collaboration (see longitudinal plan of care) ?Evaluation of current treatment plan related to  self management and patient's adherence to plan as established by provide ? ?Mental Health:  (Status: New goal.) ?Evaluation of current treatment plan related to  symptoms of anxiety ?Solution-Focused Strategies employed:  ?Problem Solving /Task Center strategies reviewed ?Discussed referral for psychiatry: and therapy ?Made referral to [facility] for therapy and [facility] for Medication management;   ? ?Task & activities to accomplish goals: ?I have place a referral with [facility] for therapy and [facility] for medication management to assist with managing your mental health needs. They will contact you. Please contact them at [phone] for [facility] and [phone] for [facility] if no one has called you in 2 weeks. ?  ? ? ?Consent to CCM Services: ?Ms. Bearse was given information about Chronic Care Management services including:  ?CCM service includes personalized support from designated clinical staff supervised by her physician, including individualized plan of care and coordination with other care providers ?24/7 contact phone numbers for assistance for urgent and routine care needs. ?Service will only be billed when office clinical staff spend 20 minutes or more in a month to coordinate care. ?Only one practitioner may furnish and bill the service in a calendar month. ?The patient may stop CCM services at any time (effective at the end of the month) by phone call to the office staff. ?The patient will be responsible for cost sharing (co-pay) of up to 20% of the service fee (after annual deductible is met). ? ?Patient agreed to services and verbal consent obtained.  ? ?The patient verbalized understanding of instructions, educational materials, and care plan provided today and agreed to receive a mailed copy of patient instructions, educational materials, and care plan.  ? ? , LCSW ?Licensed Clinical Social Worker /Care Management  ?[facility] ?[phone]  ? ? ?  ?

## 2022-01-17 NOTE — ED Notes (Signed)
Care Handoff/Report given to [facility] at Short Stay at [facility]. ?

## 2022-01-17 NOTE — Anesthesia Procedure Notes (Signed)
Procedure Name: Intubation ?Date/Time: 01/17/2022 8:27 PM ?Performed by: Suzy Bouchard, CRNA ?Pre-anesthesia Checklist: Patient identified, Emergency Drugs available, Suction available, Timeout performed and Patient being monitored ?Patient Re-evaluated:Patient Re-evaluated prior to induction ?Oxygen Delivery Method: Circle system utilized ?Preoxygenation: Pre-oxygenation with 100% oxygen ?Induction Type: IV induction, Rapid sequence and Cricoid Pressure applied ?Laryngoscope Size: Sabra Heck and 2 ?Grade View: Grade I ?Tube type: Oral ?Tube size: 7.0 mm ?Number of attempts: 1 ?Airway Equipment and Method: Stylet ?Placement Confirmation: ETT inserted through vocal cords under direct vision, positive ETCO2 and breath sounds checked- equal and bilateral ?Secured at: 21 cm ?Tube secured with: Tape ?Dental Injury: Teeth and Oropharynx as per pre-operative assessment  ? ? ? ? ?

## 2022-01-17 NOTE — Transfer of Care (Signed)
Immediate Anesthesia Transfer of Care Note ? ?Patient: [name] [name] ? ?Procedure(s) Performed: Left femoral hernia repair with mesh and small bowel resection and anastomosis (Left: Abdomen) ? ?Patient Location: PACU ? ?Anesthesia Type:General ? ?Level of Consciousness: awake, alert  and responds to stimulation ? ?Airway & Oxygen Therapy: Patient Spontanous Breathing and Patient connected to face mask oxygen ? ?Post-op Assessment: Report given to RN and Post -op Vital signs reviewed and stable ? ?Post vital signs: Reviewed and stable.  Denies pain. ? ?Last Vitals:  ?Vitals Value Taken Time  ?BP 133/79 01/17/22 2204  ?Temp    ?Pulse 87 01/17/22 2207  ?Resp 14 01/17/22 2207  ?SpO2 100 % 01/17/22 2207  ?Vitals shown include unvalidated device data. ? ?Last Pain:  ?Vitals:  ? 01/17/22 1952  ?TempSrc: Axillary  ?PainSc: 0-No pain  ?   ? ?  ? ?Complications: No notable events documented. ?

## 2022-01-17 NOTE — Anesthesia Preprocedure Evaluation (Addendum)
Anesthesia Evaluation  ?Patient identified by MRN, date of birth, ID band ?Patient awake ? ? ? ?Reviewed: ?Allergy & Precautions, H&P , NPO status , Patient's Chart, lab work & pertinent test results ? ?Airway ?Mallampati: II ? ?TM Distance: >3 FB ?Neck ROM: Full ? ? ? Dental ?no notable dental hx. ?(+) Edentulous Upper, Edentulous Lower, Dental Advisory Given ?  ?Pulmonary ?neg pulmonary ROS,  ?  ?Pulmonary exam normal ?breath sounds clear to auscultation ? ? ? ? ? ? Cardiovascular ?negative cardio ROS ? ? ?Rhythm:Regular Rate:Normal ? ? ?  ?Neuro/Psych ?Anxiety  Neuromuscular disease   ? GI/Hepatic ?Neg liver ROS, GERD  Medicated,  ?Endo/Other  ?negative endocrine ROS ? Renal/GU ?negative Renal ROS  ?negative genitourinary ?  ?Musculoskeletal ? ?(+) Arthritis , Osteoarthritis,   ? Abdominal ?  ?Peds ? Hematology ?negative hematology ROS ?(+)   ?Anesthesia Other Findings ?Patient states she takes her Levodopa once per day, at night.  Took last night. Has not had today. ? Reproductive/Obstetrics ?negative OB ROS ? ?  ? ? ? ? ? ? ? ? ? ? ? ? ? ?  ?  ? ? ? ? ? ? ?Anesthesia Physical ?Anesthesia Plan ? ?ASA: 2 and emergent ? ?Anesthesia Plan: General  ? ?Post-op Pain Management: Ofirmev IV (intra-op)*  ? ?Induction: Intravenous, Rapid sequence and Cricoid pressure planned ? ?PONV Risk Score and Plan: 4 or greater and Ondansetron, Dexamethasone and Treatment may vary due to age or medical condition ? ?Airway Management Planned: Oral ETT ? ?Additional Equipment:  ? ?Intra-op Plan:  ? ?Post-operative Plan: Extubation in OR ? ?Informed Consent: I have reviewed the patients History and Physical, chart, labs and discussed the procedure including the risks, benefits and alternatives for the proposed anesthesia with the patient or authorized representative who has indicated his/her understanding and acceptance.  ? ? ? ?Dental advisory given ? ?Plan Discussed with: CRNA ? ?Anesthesia Plan  Comments:   ? ? ? ? ? ? ?Anesthesia Quick Evaluation ? ?

## 2022-01-17 NOTE — Chronic Care Management (AMB) (Signed)
?Chronic Care Management  ? Clinical Social Work Note ? ?01/17/2022 ?Name: [name] [name] MRN: [id number] DOB: [birth year] ? ?[name] [name] is a 71 y.o. year old female who is a primary care patient of Jerline Pain, Algis Greenhouse, MD. The CCM team was consulted to assist the patient with chronic disease management and/or care coordination needs related to: Mental Health Counseling and Resources.  ? ?Engaged with patient by telephone for initial visit in response to provider referral for social work chronic care management and care coordination services.  ? ?Consent to Services:  ?The patient was given the following information about Chronic Care Management services today, agreed to services, and gave verbal consent: 1. CCM service includes personalized support from designated clinical staff supervised by the primary care provider, including individualized plan of care and coordination with other care providers 2. 24/7 contact phone numbers for assistance for urgent and routine care needs. 3. Service will only be billed when office clinical staff spend 20 minutes or more in a month to coordinate care. 4. Only one practitioner may furnish and bill the service in a calendar month. 5.The patient may stop CCM services at any time (effective at the end of the month) by phone call to the office staff. 6. The patient will be responsible for cost sharing (co-pay) of up to 20% of the service fee (after annual deductible is met). Patient agreed to services and consent obtained. ? ?Patient agreed to services and consent obtained.  ? ?Summary: Patient was accompanied by her daughter [name] [name] who provided information during this encounter. Patient is in  the Urgent care at this time, daughter assisting her with call  . She continues to experience symptoms of anxiety with taking current medication. Daughter and patient would like to connect with psychiatry  .  See Care Plan below for interventions and patient self-care  actives. ? ?Recommendation: Patient may benefit from, and is in agreement for LCSW to make referral for counseling and medication evaluation.  ? ?Follow up Plan:  ?Patient would like continued follow-up from CCM LCSW.  per patient's request will follow up in 2 to 3 weeks but unable to schedule appointment at this time.  Will call office if needed prior to next encounter. ?Will route chart to Care Guide to reschedule phone appointment   ?  ?Assessment: Review of patient past medical history, allergies, medications, and health status, including review of relevant consultants reports was performed today as part of a comprehensive evaluation and provision of chronic care management and care coordination services.    ? ?SDOH (Social Determinants of Health) assessments and interventions performed:   ? ?Advanced Directives Status: Not addressed in this encounter. ? ?CCM Care Plan ?Conditions to be addressed/monitored: Anxiety; Housing barriers ? ?Care Plan : LCSW Care Plan  ?Updates made by Maurine Cane, LCSW since 01/17/2022 12:00 AM  ?  ? ?Problem: Symptoms of Anxiety   ?  ? ?Goal: Anxiety Symptoms Identified and managed by connecting with therapy   ?Start Date: 01/17/2022  ?This Visit's Progress: On track  ?Priority: High  ?Note:   ?Current Barriers:  ?Disease Management support and education needs related to symptoms of Anxiety ? ?CSW Clinical Goal(s):  ?Patient  will demonstrate a reduction in symptoms related to :symptoms of anxiety  through collaboration with Clinical Social Worker, provider, and care team.  ? ?Interventions: ?1:1 collaboration with primary care provider regarding development and update of comprehensive plan of care as evidenced by provider attestation and co-signature ?Inter-disciplinary care team  collaboration (see longitudinal plan of care) ?Evaluation of current treatment plan related to  self management and patient's adherence to plan as established by provide ? ?Mental Health:  (Status: New  goal.) ?Evaluation of current treatment plan related to  symptoms of anxiety ?Solution-Focused Strategies employed:  ?Problem [place] strategies reviewed ?Discussed referral for psychiatry: and therapy ?Made referral to [facility] for therapy and [facility] for Medication management;  ? ?Task & activities to accomplish goals: ?I have place a referral with [facility] for therapy and [facility] for medication management to assist with managing your mental health needs. They will contact you. Please contact them at [phone] for [facility] and [phone] for [facility] if no one has called you in 2 weeks. ?  ?  ?Casimer Lanius, LCSW ?Licensed Clinical Social Worker [facility]  ?[place] ?[phone]  ? ? ?

## 2022-01-17 NOTE — ED Triage Notes (Signed)
Patient reports x2 days ago she was constipated and her daughter came and gave her an enema. Patient reports she has had significant stomach pain and an episode of emesis.  ?

## 2022-01-17 NOTE — ED Provider Notes (Signed)
?[facility] ?Provider Note ? ? ?CSN: [id number] ?Arrival date & time: 01/17/22  1357 ? ?  ? ?History ? ?Chief Complaint  ?Patient presents with  ? Abdominal Pain  ? ? ?[name] [name] is a 71 y.o. female with a history of chronic constipation, Parkinson's disease, presented emergency department with difficulty with bowel movements and abdominal pain.  Supplement is provided by the patient's daughter at bedside.  They report the patient been constipated for several days.  2 days ago her daughter came over and gave her an enema, and the patient produced "several hard pellets of stool."  But for the past 2 days the patient has felt nauseated and "not right since the enema".  Patient had some vomiting yesterday.  She is not wanting to eat or drink if she feels nauseated.  She denies any history of abdominal surgeries.  She takes multiple laxatives at home for her constipation, including Dulcolax, suppositories, but is not producing a bowel movement.  She is not on any new medications, but did start busperione for depression 3 months ago. ? ?HPI ? ?  ? ?Home Medications ?Prior to Admission medications   ?Medication Sig Start Date End Date Taking? Authorizing Provider  ?Ascorbic Acid (VITAMIN C PO) Take 1 tablet by mouth daily.    [provider]  ?b complex vitamins capsule Take 1 capsule by mouth daily.    [provider]  ?busPIRone (BUSPAR) 5 MG tablet TAKE ONE TABLET BY MOUTH TWICE DAILY 01/04/22   Vivi Barrack, MD  ?carbidopa-levodopa (SINEMET IR) 25-250 MG tablet Take 1 tablet by mouth 3 (three) times daily. 07/10/21   Vivi Barrack, MD  ?Cholecalciferol (VITAMIN D) 2000 UNITS CAPS Take 5,000 Units by mouth daily.    [provider]  ?diazepam (VALIUM) 2 MG tablet Take 0.5 tablets (1 mg total) by mouth every 12 (twelve) hours as needed (vertigo). 09/25/21   Vivi Barrack, MD  ?fluticasone Asencion Islam) 50 MCG/ACT nasal spray Place 2 sprays into both nostrils  daily. 03/02/21   Hughie Closs, PA-C  ?glucosamine-chondroitin 500-400 MG tablet Take 1 tablet by mouth daily.    [provider]  ?ibuprofen (ADVIL,MOTRIN) 200 MG tablet Take 400 mg by mouth every 6 (six) hours as needed for moderate pain.     [provider]  ?Magnesium Hydroxide (MAGNESIA PO) Take 1 tablet by mouth daily.    [provider]  ?Multiple Vitamin (MULTIVITAMIN) capsule Take 1 capsule by mouth daily.    [provider]  ?Omega-3 Fatty Acids (OMEGA 3 PO) Take 1 tablet by mouth daily.    [provider]  ?tetrahydrozoline-zinc (VISINE-AC) 0.05-0.25 % ophthalmic solution Place 2 drops into both eyes 4 (four) times daily as needed (allergies).    [provider]  ?vitamin B-12 (CYANOCOBALAMIN) 1000 MCG tablet Take by mouth.    [provider]  ?amantadine (SYMMETREL) 100 MG capsule Take 1 capsule (100 mg total) by mouth 2 (two) times daily. ?Patient not taking: Reported on 09/21/2019 08/05/19 02/01/20  Vivi Barrack, MD  ?   ? ?Allergies    ?Sulfa antibiotics and Sulfasalazine   ? ?Review of Systems   ?Review of Systems ? ?Physical Exam ?Updated Vital Signs ?BP 97/71 (BP Location: Left Arm)   Pulse 98   Temp 97.9 ?F (36.6 ?C) (Oral)   Resp 16   Ht '5\' 7"'$  (1.702 m)   Wt 43.4 kg   SpO2 99%   BMI 14.99 kg/m?  ?  Physical Exam ?Constitutional:   ?   General: She is not in acute distress. ?   Comments: Thin, frail, chronically ill-appearing  ?HENT:  ?   Head: Normocephalic and atraumatic.  ?Eyes:  ?   Conjunctiva/sclera: Conjunctivae normal.  ?   Pupils: Pupils are equal, round, and reactive to light.  ?Cardiovascular:  ?   Rate and Rhythm: Normal rate and regular rhythm.  ?Pulmonary:  ?   Effort: Pulmonary effort is normal. No respiratory distress.  ?Abdominal:  ?   General: There is no distension.  ?   Tenderness: There is abdominal tenderness in the suprapubic area and left lower quadrant. Negative signs include Murphy's sign.  ?    Comments: Firm nonreducible left inguinal hernia, nontender on exam, without overlying skin changes  ?Skin: ?   General: Skin is warm and dry.  ?Neurological:  ?   General: No focal deficit present.  ?   Mental Status: She is alert. Mental status is at baseline.  ?Psychiatric:     ?   Mood and Affect: Mood normal.     ?   Behavior: Behavior normal.  ? ? ?ED Results / Procedures / Treatments   ?Labs ?(all labs ordered are listed, but only abnormal results are displayed) ?Labs Reviewed  ?LIPASE, BLOOD - Abnormal; Notable for the following components:  ?    Result Value  ? Lipase <10 (*)   ? All other components within normal limits  ?COMPREHENSIVE METABOLIC PANEL - Abnormal; Notable for the following components:  ? Glucose, Bld 105 (*)   ? Calcium 10.4 (*)   ? AST 14 (*)   ? All other components within normal limits  ?CBC - Abnormal; Notable for the following components:  ? WBC 15.6 (*)   ? All other components within normal limits  ?COMPREHENSIVE METABOLIC PANEL - Abnormal; Notable for the following components:  ? Potassium 3.3 (*)   ? Glucose, Bld 119 (*)   ? Total Protein 5.4 (*)   ? Albumin 3.3 (*)   ? Alkaline Phosphatase 36 (*)   ? All other components within normal limits  ?CBC - Abnormal; Notable for the following components:  ? WBC 12.5 (*)   ? All other components within normal limits  ?RESP PANEL BY RT-PCR (FLU A&B, COVID) [id number]  ?SURGICAL PCR SCREEN  ?URINALYSIS, ROUTINE W REFLEX MICROSCOPIC  ?SURGICAL PATHOLOGY  ? ? ?EKG ?None ? ?Radiology ?CT ABDOMEN PELVIS W CONTRAST ? ?Result Date: 01/17/2022 ?CLINICAL DATA:  Bowel obstruction suspected. No bowel movement over the last several days. Enema a yesterday. EXAM: CT ABDOMEN AND PELVIS WITH CONTRAST TECHNIQUE: Multidetector CT imaging of the abdomen and pelvis was performed using the standard protocol following bolus administration of intravenous contrast. RADIATION DOSE REDUCTION: This exam was performed according to the departmental dose-optimization  program which includes automated exposure control, adjustment of the mA and/or kV according to patient size and/or use of iterative reconstruction technique. CONTRAST:  11m OMNIPAQUE IOHEXOL 300 MG/ML  SOLN COMPARISON:  02/01/2020 FINDINGS: Lower chest: Chronic scarring at the left lung base. Hepatobiliary: Multiple hemangiomas scattered throughout the liver as seen on the previous study. The largest in the right lobe measures up to 5 cm in diameter. No new or progressive lesion. No calcified gallstones. Pancreas: Normal Spleen: Normal Adrenals/Urinary Tract: Adrenal glands are normal. Right kidney is normal. 4 cm cyst of the left kidney, slightly enlarged since the previous study. No hydronephrosis. Bladder is normal. Stomach/Bowel: Stomach is distended with fluid. There is  dilated fluid and air-filled small intestine consistent with small bowel obstruction. Level of obstruction is probably in the ileum. I think this may be occurring at a left inguinal hernia as below. Normal appearing appendix. The colon is largely collapsed. Vascular/Lymphatic: Aorta and IVC are normal. No retroperitoneal adenopathy. Reproductive: Uterine enlargement with a dominant leiomyoma measuring 7.5 cm in diameter. No adnexal mass. Other: Small amount of free fluid. Musculoskeletal: Left inguinal hernia that appears to contain a small amount of fluid but also a more hyperdense structure that I think is incarcerated small bowel. This is probably the location of the small bowel obstruction. IMPRESSION: Incarcerated left inguinal small-bowel hernia with high-grade small bowel obstruction. Multiple hemangiomas of the liver as seen chronically. 7-8 cm uterine leiomyoma as seen previously. Electronically Signed   By: Nelson Chimes M.D.   On: 01/17/2022 17:18  ? ?DG Abd Portable 1V ? ?Result Date: 01/17/2022 ?CLINICAL DATA:  NG tube placement EXAM: PORTABLE ABDOMEN - 1 VIEW COMPARISON:  CT 03/19/2022 FINDINGS: Esophageal tube coiled in the  proximal stomach. Atelectasis in the left lung base. Partially visualized gaseous distension of bowel in the upper abdomen IMPRESSION: Esophageal tube is coiled within the fundus of the stomach. Electronically Signed   By: Maudie Mercury

## 2022-01-17 NOTE — H&P (Signed)
? ? ?Reason for Consult:obstruction ?Referring Provider: Myrtie Cruise ? ?[name] [name] is an 71 y.o. female.  ?HPI: 71 yo female with 1 day of worsening abdominal pain and vomiting after 7 days of constipation. She has been taking multiple laxatives without improvement. She complains of constant pain over the left groin bulge. ? ?Past Medical History:  ?Diagnosis Date  ? Fibroid   ? GERD (gastroesophageal reflux disease)   ? Muscle weakness of lower extremity 01/17/2015  ? Osteopenia   ? Parkinson disease ([place]) 01/17/2015  ? ? ?Past Surgical History:  ?Procedure Laterality Date  ? FINGER SURGERY    ? MOUTH SURGERY    ? TOE SURGERY    ? TONSILLECTOMY    ? TUBAL LIGATION    ? x2  ? ? ?Family History  ?Problem Relation Age of Onset  ? Rheum arthritis Mother   ? Breast cancer Mother   ?     96's  ? Cancer Father   ?     throat  ? Breast cancer Sister   ?     46's  ? Hypertension Brother   ? Hyperlipidemia Brother   ? Heart attack Son   ?     Deceased, 14  ? Healthy Daughter   ? Parkinsonism Neg Hx   ? ? ?Social History:  reports that she has never smoked. She has never used smokeless tobacco. She reports that she does not drink alcohol and does not use drugs. ? ?Allergies:  ?Allergies  ?Allergen Reactions  ? Sulfa Antibiotics Other (See Comments)  ?  Childhood allergy   ? Sulfasalazine Other (See Comments)  ?  Childhood allergy   ? ? ?Medications: I have reviewed the patient's current medications. ? ?Results for orders placed or performed during the hospital encounter of 01/17/22 (from the past 48 hour(s))  ?Lipase, blood     Status: Abnormal  ? Collection Time: 01/17/22  2:16 PM  ?Result Value Ref Range  ? Lipase <10 (L) 11 - 51 U/L  ?  Comment: Performed at [facility], [address], [place], [place] [address]  ?Comprehensive metabolic panel     Status: Abnormal  ? Collection Time: 01/17/22  2:16 PM  ?Result Value Ref Range  ? Sodium 138 135 - 145 mmol/L  ? Potassium 3.5 3.5 - 5.1 mmol/L  ?  Chloride 100 98 - 111 mmol/L  ? CO2 27 22 - 32 mmol/L  ? Glucose, Bld 105 (H) 70 - 99 mg/dL  ?  Comment: Glucose reference range applies only to samples taken after fasting for at least 8 hours.  ? BUN 18 8 - 23 mg/dL  ? Creatinine, Ser 0.63 0.44 - 1.00 mg/dL  ? Calcium 10.4 (H) 8.9 - 10.3 mg/dL  ? Total Protein 7.4 6.5 - 8.1 g/dL  ? Albumin 4.3 3.5 - 5.0 g/dL  ? AST 14 (L) 15 - 41 U/L  ? ALT <5 0 - 44 U/L  ? Alkaline Phosphatase 41 38 - 126 U/L  ? Total Bilirubin 1.0 0.3 - 1.2 mg/dL  ? GFR, Estimated >60 >60 mL/min  ?  Comment: (NOTE) ?Calculated using the CKD-EPI Creatinine Equation (2021) ?  ? Anion gap 11 5 - 15  ?  Comment: Performed at [facility], [address] [place], [place] [address]  ?CBC     Status: Abnormal  ? Collection Time: 01/17/22  2:16 PM  ?Result Value Ref Range  ? WBC 15.6 (H) 4.0 - 10.5 K/uL  ?  RBC 4.78 3.87 - 5.11 MIL/uL  ? Hemoglobin 13.9 12.0 - 15.0 g/dL  ? HCT 42.5 36.0 - 46.0 %  ? MCV 88.9 80.0 - 100.0 fL  ? MCH 29.1 26.0 - 34.0 pg  ? MCHC 32.7 30.0 - 36.0 g/dL  ? RDW 13.2 11.5 - 15.5 %  ? Platelets 183 150 - 400 K/uL  ? nRBC 0.0 0.0 - 0.2 %  ?  Comment: Performed at [facility], [address] [place], [place] [address]  ?Resp Panel by RT-PCR (Flu A&B, Covid) Nasopharyngeal Swab     Status: None  ? Collection Time: 01/17/22  6:11 PM  ? Specimen: Nasopharyngeal Swab; Nasopharyngeal(NP) swabs in vial transport medium  ?Result Value Ref Range  ? SARS Coronavirus 2 by RT PCR NEGATIVE NEGATIVE  ?  Comment: (NOTE) ?SARS-CoV-2 target nucleic acids are NOT DETECTED. ? ?The SARS-CoV-2 RNA is generally detectable in upper respiratory ?specimens during the acute phase of infection. The lowest ?concentration of SARS-CoV-2 viral copies this assay can detect is ?138 copies/mL. A negative result does not preclude SARS-Cov-2 ?infection and should not be used as the sole basis for treatment or ?other patient management decisions. A negative result may occur  with  ?improper specimen collection/handling, submission of specimen other ?than nasopharyngeal swab, presence of viral mutation(s) within the ?areas targeted by this assay, and inadequate number of viral ?copies(<138 copies/mL). A negative result must be combined with ?clinical observations, patient history, and epidemiological ?information. The expected result is Negative. ? ?Fact Sheet for Patients:  ?EntrepreneurPulse.com.au ? ?Fact Sheet for Healthcare Providers:  ?IncredibleEmployment.be ? ?This test is no t yet approved or cleared by the [place] FDA and  ?has been authorized for detection and/or diagnosis of SARS-CoV-2 by ?FDA under an Emergency Use Authorization (EUA). This EUA will remain  ?in effect (meaning this test can be used) for the duration of the ?COVID-19 declaration under Section 564(b)(1) of the Act, 21 ?U.S.C.section 360bbb-3(b)(1), unless the authorization is terminated  ?or revoked sooner.  ? ? ?  ? Influenza A by PCR NEGATIVE NEGATIVE  ? Influenza B by PCR NEGATIVE NEGATIVE  ?  Comment: (NOTE) ?The Xpert Xpress SARS-CoV-2/FLU/RSV plus assay is intended as an aid ?in the diagnosis of influenza from Nasopharyngeal swab specimens and ?should not be used as a sole basis for treatment. Nasal washings and ?aspirates are unacceptable for Xpert Xpress SARS-CoV-2/FLU/RSV ?testing. ? ?Fact Sheet for Patients: ?EntrepreneurPulse.com.au ? ?Fact Sheet for Healthcare Providers: ?IncredibleEmployment.be ? ?This test is not yet approved or cleared by the [place] FDA and ?has been authorized for detection and/or diagnosis of SARS-CoV-2 by ?FDA under an Emergency Use Authorization (EUA). This EUA will remain ?in effect (meaning this test can be used) for the duration of the ?COVID-19 declaration under Section 564(b)(1) of the Act, 21 U.S.C. ?section 360bbb-3(b)(1), unless the authorization is terminated or ?revoked. ? ?Performed  at [facility], [address], ?[place], [place] [address] ?  ? ? ?CT ABDOMEN PELVIS W CONTRAST ? ?Result Date: 01/17/2022 ?CLINICAL DATA:  Bowel obstruction suspected. No bowel movement over the last several days. Enema a yesterday. EXAM: CT ABDOMEN AND PELVIS WITH CONTRAST TECHNIQUE: Multidetector CT imaging of the abdomen and pelvis was performed using the standard protocol following bolus administration of intravenous contrast. RADIATION DOSE REDUCTION: This exam was performed according to the departmental dose-optimization program which includes automated exposure control, adjustment of the mA and/or kV according to patient size and/or use of iterative reconstruction technique. CONTRAST:  25m OMNIPAQUE IOHEXOL 300 MG/ML  SOLN COMPARISON:  02/01/2020 FINDINGS: Lower chest: Chronic scarring at the left lung base. Hepatobiliary: Multiple hemangiomas scattered throughout the liver as seen on the previous study. The largest in the right lobe measures up to 5 cm in diameter. No new or progressive lesion. No calcified gallstones. Pancreas: Normal Spleen: Normal Adrenals/Urinary Tract: Adrenal glands are normal. Right kidney is normal. 4 cm cyst of the left kidney, slightly enlarged since the previous study. No hydronephrosis. Bladder is normal. Stomach/Bowel: Stomach is distended with fluid. There is dilated fluid and air-filled small intestine consistent with small bowel obstruction. Level of obstruction is probably in the ileum. I think this may be occurring at a left inguinal hernia as below. Normal appearing appendix. The colon is largely collapsed. Vascular/Lymphatic: Aorta and IVC are normal. No retroperitoneal adenopathy. Reproductive: Uterine enlargement with a dominant leiomyoma measuring 7.5 cm in diameter. No adnexal mass. Other: Small amount of free fluid. Musculoskeletal: Left inguinal hernia that appears to contain a small amount of fluid but also a more hyperdense structure that I  think is incarcerated small bowel. This is probably the location of the small bowel obstruction. IMPRESSION: Incarcerated left inguinal small-bowel hernia with high-grade small bowel obstruction. Multiple

## 2022-01-18 ENCOUNTER — Encounter (HOSPITAL_COMMUNITY): Payer: Self-pay | Admitting: General Surgery

## 2022-01-18 ENCOUNTER — Other Ambulatory Visit: Payer: Self-pay

## 2022-01-18 DIAGNOSIS — G20 Parkinson's disease: Secondary | ICD-10-CM

## 2022-01-18 DIAGNOSIS — M6281 Muscle weakness (generalized): Secondary | ICD-10-CM

## 2022-01-18 LAB — URINALYSIS, ROUTINE W REFLEX MICROSCOPIC
Bacteria, UA: NONE SEEN
Bilirubin Urine: NEGATIVE
Glucose, UA: NEGATIVE mg/dL
Hgb urine dipstick: NEGATIVE
Ketones, ur: 20 mg/dL — AB
Leukocytes,Ua: NEGATIVE
Nitrite: NEGATIVE
Protein, ur: 30 mg/dL — AB
Specific Gravity, Urine: >1.046 — ABNORMAL HIGH (ref 1.005–1.030)
pH: 5 (ref 5.0–8.0)

## 2022-01-18 LAB — SURGICAL PCR SCREEN
MRSA, PCR: NEGATIVE
Staphylococcus aureus: NEGATIVE

## 2022-01-18 LAB — COMPREHENSIVE METABOLIC PANEL
ALT: 7 U/L (ref 0–44)
AST: 17 U/L (ref 15–41)
Albumin: 3.3 g/dL — ABNORMAL LOW (ref 3.5–5.0)
Alkaline Phosphatase: 36 U/L — ABNORMAL LOW (ref 38–126)
Anion gap: 8 (ref 5–15)
BUN: 13 mg/dL (ref 8–23)
CO2: 25 mmol/L (ref 22–32)
Calcium: 9.2 mg/dL (ref 8.9–10.3)
Chloride: 104 mmol/L (ref 98–111)
Creatinine, Ser: 0.63 mg/dL (ref 0.44–1.00)
GFR, Estimated: >60 mL/min (ref >60)
Glucose, Bld: 119 mg/dL — ABNORMAL HIGH (ref 70–99)
Potassium: 3.3 mmol/L — ABNORMAL LOW (ref 3.5–5.1)
Sodium: 137 mmol/L (ref 135–145)
Total Bilirubin: 1.2 mg/dL (ref 0.3–1.2)
Total Protein: 5.4 g/dL — ABNORMAL LOW (ref 6.5–8.1)

## 2022-01-18 LAB — CBC
HCT: 38.1 % (ref 36.0–46.0)
Hemoglobin: 12.4 g/dL (ref 12.0–15.0)
MCH: 29.6 pg (ref 26.0–34.0)
MCHC: 32.5 g/dL (ref 30.0–36.0)
MCV: 90.9 fL (ref 80.0–100.0)
Platelets: 152 10*3/uL (ref 150–400)
RBC: 4.19 MIL/uL (ref 3.87–5.11)
RDW: 13.2 % (ref 11.5–15.5)
WBC: 12.5 10*3/uL — ABNORMAL HIGH (ref 4.0–10.5)
nRBC: 0 % (ref 0.0–0.2)
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
## 2022-01-18 NOTE — Anesthesia Postprocedure Evaluation (Signed)
Anesthesia Post Note ? ?Patient: [name] [name] ? ?Procedure(s) Performed: Left femoral hernia repair with mesh and small bowel resection and anastomosis (Left: Abdomen) ? ?  ? ?Patient location during evaluation: PACU ?Anesthesia Type: General ?Level of consciousness: awake and alert ?Pain management: pain level controlled ?Vital Signs Assessment: post-procedure vital signs reviewed and stable ?Respiratory status: spontaneous breathing, nonlabored ventilation and respiratory function stable ?Cardiovascular status: blood pressure returned to baseline and stable ?Postop Assessment: no apparent nausea or vomiting ?Anesthetic complications: no ? ? ?No notable events documented. ? ?Last Vitals:  ?Vitals:  ? 01/17/22 2331 01/18/22 0408  ?BP: 114/70 123/78  ?Pulse: 86 (!) 114  ?Resp: 16 14  ?Temp: 36.7 ?C 36.7 ?C  ?SpO2: 100% 99%  ?  ?Last Pain:  ?Vitals:  ? 01/18/22 0408  ?TempSrc: Oral  ?PainSc:   ? ? ?  ?  ?  ?  ?  ?  ? ?[name] [name],W. EDMOND ? ? ? ? ?

## 2022-01-18 NOTE — Progress Notes (Signed)
Progress Note: General Surgery Service  ? ?Chief Complaint/Subjective: ?NG is irritating.  Pain okay. ? ?Objective: ?Vital signs in last 24 hours: ?Temp:  [97.6 ?F (36.4 ?C)-99 ?F (37.2 ?C)] 97.9 ?F (36.6 ?C) (03/24 0726) ?Pulse Rate:  [70-114] 98 (03/24 0726) ?Resp:  [11-22] 16 (03/24 0726) ?BP: (97-147)/(70-89) 97/71 (03/24 0726) ?SpO2:  [96 %-100 %] 99 % (03/24 0726) ?Weight:  [43.4 kg-53.1 kg] 43.4 kg (03/23 2331) ?Last BM Date :  (Unknown by patient) ? ?Intake/Output from previous day: ?03/23 0701 - 03/24 0700 ?In: 2230.7 [I.V.:1131.7; IV [id number]:6270] ?Out: 690 [Urine:300; Emesis/NG output:60; Blood:30] ?Intake/Output this shift: ?No intake/output data recorded. ? ?GI: Abd Incision c/d/i ? ?Lab Results: ?CBC  ?Recent Labs  ?  01/17/22 ?1416 01/18/22 ?0112  ?WBC 15.6* 12.5*  ?HGB 13.9 12.4  ?HCT 42.5 38.1  ?PLT 183 152  ? ?BMET ?Recent Labs  ?  01/17/22 ?1416 01/18/22 ?0112  ?NA 138 137  ?K 3.5 3.3*  ?CL 100 104  ?CO2 27 25  ?GLUCOSE 105* 119*  ?BUN 18 13  ?CREATININE 0.63 0.63  ?CALCIUM 10.4* 9.2  ? ?PT/INR ?No results for input(s): LABPROT, INR in the last 72 hours. ?ABG ?No results for input(s): PHART, HCO3 in the last 72 hours. ? ?Invalid input(s): PCO2, PO2 ? ?Anti-infectives: ?Anti-infectives (From admission, onward)  ? ? None  ? ?  ? ? ?Medications: ?Scheduled Meds: ? vitamin C  500 mg Oral Daily  ? busPIRone  5 mg Oral BID  ? carbidopa-levodopa  1 tablet Oral TID  ? enoxaparin (LOVENOX) injection  30 mg Subcutaneous Q24H  ? ?Continuous Infusions: ? dextrose 5 % and 0.45% NaCl 50 mL/hr at 01/18/22 0000  ? ?PRN Meds:.acetaminophen **OR** acetaminophen, metoprolol tartrate, morphine injection, ondansetron **OR** ondansetron (ZOFRAN) IV, oxyCODONE ? ?Assessment/Plan: ?[name] [name] is a 71 year old female s/p left femoral hernia repair with phasix mesh and small bowel resection and anastomosis 01/17/2022, with Dr. Kieth Brightly ? ?Doing well POD1 ?Continue NPO, NG till return of flatus, then work to advance  diet ?Pain control ?Increase activity ? ? LOS: 1 day  ? ?FEN: NPO, IVF, NG ?ID: None ?VTE: SCDs, Lovenox ?Foley: None ?Dispo: Continued care on floor, awaiting return of bowel function ? ? ? ?Felicie Morn, MD ? ?[facility] ?Use [email or web address] to contact on call provider ? ?Daily Billing: ?[phone] - post op ? ? ?

## 2022-01-18 NOTE — Telephone Encounter (Signed)
Order placed for home health for patient  ?

## 2022-01-18 NOTE — Progress Notes (Signed)
Pt's daughter requesting her mom be seen by Neurology & Psychiatry to look at her medicines to see if there is any other medicine to help her. ?Daughter understands that she is here because of emergent surgery but she wants her to live a better life. ?

## 2022-01-19 ENCOUNTER — Inpatient Hospital Stay: Payer: Self-pay

## 2022-01-19 LAB — CBC
HCT: 36.2 % (ref 36.0–46.0)
Hemoglobin: 12 g/dL (ref 12.0–15.0)
MCH: 30.1 pg (ref 26.0–34.0)
MCHC: 33.1 g/dL (ref 30.0–36.0)
MCV: 90.7 fL (ref 80.0–100.0)
Platelets: 141 10*3/uL — ABNORMAL LOW (ref 150–400)
RBC: 3.99 MIL/uL (ref 3.87–5.11)
RDW: 13.2 % (ref 11.5–15.5)
WBC: 10.5 10*3/uL (ref 4.0–10.5)
nRBC: 0 % (ref 0.0–0.2)

## 2022-01-19 LAB — COMPREHENSIVE METABOLIC PANEL
ALT: 7 U/L (ref 0–44)
AST: 16 U/L (ref 15–41)
Albumin: 3.2 g/dL — ABNORMAL LOW (ref 3.5–5.0)
Alkaline Phosphatase: 37 U/L — ABNORMAL LOW (ref 38–126)
Anion gap: 9 (ref 5–15)
BUN: 20 mg/dL (ref 8–23)
CO2: 25 mmol/L (ref 22–32)
Calcium: 9.3 mg/dL (ref 8.9–10.3)
Chloride: 101 mmol/L (ref 98–111)
Creatinine, Ser: 0.75 mg/dL (ref 0.44–1.00)
GFR, Estimated: >60 mL/min (ref >60)
Glucose, Bld: 117 mg/dL — ABNORMAL HIGH (ref 70–99)
Potassium: 3.5 mmol/L (ref 3.5–5.1)
Sodium: 135 mmol/L (ref 135–145)
Total Bilirubin: 1.3 mg/dL — ABNORMAL HIGH (ref 0.3–1.2)
Total Protein: 6 g/dL — ABNORMAL LOW (ref 6.5–8.1)

## 2022-01-19 LAB — PHOSPHORUS: Phosphorus: 2.7 mg/dL (ref 2.5–4.6)

## 2022-01-19 LAB — HEMOGLOBIN A1C
Hgb A1c MFr Bld: 5.4 % (ref 4.8–5.6)
Mean Plasma Glucose: 108.28 mg/dL

## 2022-01-19 LAB — MAGNESIUM: Magnesium: 1.9 mg/dL (ref 1.7–2.4)

## 2022-01-19 MED ORDER — CHLORHEXIDINE GLUCONATE CLOTH 2 % EX PADS
6.0000 | MEDICATED_PAD | Freq: Every day | CUTANEOUS | Status: DC
  Administered 2022-01-19 – 2022-02-14 (×27): 6 via TOPICAL

## 2022-01-19 MED ORDER — TRAVASOL 10 % IV SOLN
INTRAVENOUS | Status: AC
  Filled 2022-01-19: qty 360

## 2022-01-19 MED ORDER — INSULIN ASPART 100 UNIT/ML IJ SOLN: 0.0000 [IU] | Freq: Four times a day (QID) | INTRAMUSCULAR | Status: DC

## 2022-01-19 MED ORDER — DEXTROSE-NACL 5-0.45 % IV SOLN: INTRAVENOUS | Status: AC

## 2022-01-19 MED ORDER — SODIUM CHLORIDE 0.9% FLUSH
10.0000 mL | INTRAVENOUS | Status: DC | PRN
  Administered 2022-02-11: 20 mL

## 2022-01-19 MED ORDER — BUSPIRONE HCL 5 MG PO TABS: 2.5000 mg | ORAL_TABLET | Freq: Three times a day (TID) | ORAL | Status: DC | PRN

## 2022-01-19 MED ORDER — SODIUM CHLORIDE 0.9% FLUSH
10.0000 mL | Freq: Two times a day (BID) | INTRAVENOUS | Status: DC
  Administered 2022-01-19 – 2022-02-03 (×27): 10 mL
  Administered 2022-02-04: 30 mL
  Administered 2022-02-04 – 2022-02-14 (×14): 10 mL

## 2022-01-19 MED ORDER — BUSPIRONE HCL 5 MG PO TABS
2.5000 mg | ORAL_TABLET | Freq: Three times a day (TID) | ORAL | Status: DC | PRN
  Administered 2022-01-19: 1.25 mg via ORAL
  Filled 2022-01-19: qty 1

## 2022-01-19 MED ORDER — BUSPIRONE HCL 5 MG PO TABS: 2.5000 mg | ORAL_TABLET | Freq: Two times a day (BID) | ORAL | Status: DC

## 2022-01-19 MED ORDER — SODIUM CHLORIDE 0.9 % IV BOLUS
500.0000 mL | Freq: Once | INTRAVENOUS | Status: AC
  Administered 2022-01-19: 500 mL via INTRAVENOUS

## 2022-01-19 MED ORDER — POTASSIUM PHOSPHATES 15 MMOLE/5ML IV SOLN
15.0000 mmol | Freq: Once | INTRAVENOUS | Status: AC
  Administered 2022-01-19: 15 mmol via INTRAVENOUS
  Filled 2022-01-19: qty 5

## 2022-01-19 NOTE — Progress Notes (Signed)
Peripherally Inserted Central Catheter Placement ? ?The IV Nurse has discussed with the patient and/or persons authorized to consent for the patient, the purpose of this procedure and the potential benefits and risks involved with this procedure.  The benefits include less needle sticks, lab draws from the catheter, and the patient may be discharged home with the catheter. Risks include, but not limited to, infection, bleeding, blood clot (thrombus formation), and puncture of an artery; nerve damage and irregular heartbeat and possibility to perform a PICC exchange if needed/ordered by physician.  Alternatives to this procedure were also discussed.  Bard Power PICC patient education guide, fact sheet on infection prevention and patient information card has been provided to patient /or left at bedside. Daughter at bedside and agreeable to PICC placement. Al questions answered to patient/daughter satisfaction.   ? ?PICC Placement Documentation  ?PICC Double Lumen 01/19/22 Right Brachial 35 cm 0 cm (Active)  ?Indication for Insertion or Continuance of Line Administration of hyperosmolar/irritating solutions (i.e. TPN, Vancomycin, etc.) 01/19/22 1200  ?Exposed Catheter (cm) 0 cm 01/19/22 1200  ?Site Assessment Clean, Dry, Intact 01/19/22 1200  ?Lumen #1 Status Flushed;Saline locked;Blood return noted 01/19/22 1200  ?Lumen #2 Status Flushed;Saline locked;Blood return noted 01/19/22 1200  ?Dressing Type Securing device;Transparent 01/19/22 1200  ?Dressing Status Antimicrobial disc in place 01/19/22 1200  ?Safety Lock Not Applicable 37/90/24 0973  ?Line Care Connections checked and tightened 01/19/22 1200  ?Line Adjustment (NICU/IV Team Only) No 01/19/22 1200  ?Dressing Intervention New dressing 01/19/22 1200  ?Dressing Change Due 01/26/22 01/19/22 1200  ? ? ? ? ? ?Mickel Baas  Rhyleigh Grassel ?01/19/2022, 12:10 PM ? ?

## 2022-01-19 NOTE — Progress Notes (Signed)
Physical Therapy Evaluation ?Patient Details ?Name: [name] [name] ?MRN: [id number] ?DOB: 08-19-1951 ?Today's Date: 01/19/2022 ? ?History of Present Illness ? 71 yo female with 1 day of worsening abdominal pain and vomiting, s/p femoral hernia repair with SBR 2/2 gangrene on 3/23. Pt with NGT to suction. Noted poor po intake for about 6 months due to living situation. Daughter reports pt  with wt loss of 20-30# within past few months. Significant PMH: Parkinson disease, anxiety  ?Clinical Impression ? PTA, pt lives with her son with autism, who provides min assist for transfers to [facility] and set up for spone bathing. Pt is nonverbal and uses text to communicate. Pt presents with generalized weakness, impaired sitting balance, and pain. Pt able to participate in bed level exercises for strengthening/ROM. Pt requiring moderate assist for bed mobility and transfers to standing using walker. HR up to 133 bpm. Will benefit from post acute rehab to address deficits and maximize functional mobility.  ?   ? ?Recommendations for follow up therapy are one component of a multi-disciplinary discharge planning process, led by the attending physician.  Recommendations may be updated based on patient status, additional functional criteria and insurance authorization. ? ?Follow Up Recommendations Skilled nursing-short term rehab (<3 hours/day) ? ?  ?Assistance Recommended at Discharge Frequent or constant Supervision/Assistance  ?Patient can return home with the following ? A lot of help with walking and/or transfers;A lot of help with bathing/dressing/bathroom ? ?  ?Equipment Recommendations Wheelchair cushion (measurements PT);Wheelchair (measurements PT)  ?Recommendations for Other Services ?    ?  ?Functional Status Assessment Patient has had a recent decline in their functional status and/or demonstrates limited ability to make significant improvements in function in a reasonable and predictable amount of time  ? ?  ?Precautions /  Restrictions Precautions ?Precautions: Fall ?Precaution Comments: watch HR, NGT ?Restrictions ?Weight Bearing Restrictions: No  ? ?  ? ?Mobility ? Bed Mobility ?Overal bed mobility: Needs Assistance ?Bed Mobility: Supine to Sit, Sit to Supine ?  ?  ?Supine to sit: Mod assist ?Sit to supine: Mod assist ?  ?General bed mobility comments: able to initiate moving BLE's towards edge of bed, assist at hips to scoot and trunk to upright. Assist for BLE's back into bed ?  ? ?Transfers ?Overall transfer level: Needs assistance ?  ?Transfers: Sit to/from Stand ?Sit to Stand: Mod assist ?  ?  ?  ?  ?  ?General transfer comment: ModA to stand from edge of bed, heavy posterior lean initially ?  ? ?Ambulation/Gait ?  ?  ?  ?  ?  ?  ?  ?General Gait Details: unable ? ?Stairs ?  ?  ?  ?  ?  ? ?Wheelchair Mobility ?  ? ?Modified Rankin (Stroke Patients Only) ?  ? ?  ? ?Balance Overall balance assessment: Needs assistance ?Sitting-balance support: Bilateral upper extremity supported, Feet supported ?Sitting balance-Leahy Scale: Poor ?Sitting balance - Comments: posterior, left lateral lean ?  ?Standing balance support: During functional activity ?Standing balance-Leahy Scale: Poor ?  ?  ?  ?  ?  ?  ?  ?  ?  ?  ?  ?  ?   ? ? ? ?Pertinent Vitals/Pain Pain Assessment ?Pain Assessment: Faces ?Faces Pain Scale: Hurts little more ?Pain Location: "hernia site," nose, neck ?Pain Descriptors / Indicators: Operative site guarding, Sore, Tender, Grimacing ?Pain Intervention(s): Limited activity within patient's tolerance, Monitored during session  ? ? ?Home Living Family/patient expects to be discharged to:: Private  residence ?Living Arrangements: Children (autistic son lives with pt) ?Available Help at Discharge: Family ?Type of Home: House ?Home Access: Stairs to enter ?Entrance Stairs-Rails: Right ?Entrance Stairs-Number of Steps: 3-4 ?  ?Home Layout: One level ?Home Equipment: Conservation officer, nature (2 wheels);Rollator (4 wheels);Cane - single  point;BSC/3in1 ?Additional Comments: Children assisted with groceries, meals, home mgt  ?  ?Prior Function Prior Level of Function : Needs assist ?  ?  ?  ?  ?  ?  ?Mobility Comments: used rollater for mobility ?ADLs Comments: Set up with sponge  bathing, was able to bathe UB, peri areas, Ind with grooming and toileting with BSC ?  ? ? ?Hand Dominance  ?   ? ?  ?Extremity/Trunk Assessment  ? Upper Extremity Assessment ?Upper Extremity Assessment: Defer to OT evaluation ?  ? ?Lower Extremity Assessment ?Lower Extremity Assessment: Generalized weakness ?  ? ?Cervical / Trunk Assessment ?Cervical / Trunk Assessment: Other exceptions ?Cervical / Trunk Exceptions: cachetic  ?Communication  ? Communication: Other (comment) (currently nonverbal; uses text to communicate)  ?Cognition Arousal/Alertness: Awake/alert ?Behavior During Therapy: [facility] for tasks assessed/performed ?Overall Cognitive Status: Within Functional Limits for tasks assessed ?  ?  ?  ?  ?  ?  ?  ?  ?  ?  ?  ?  ?  ?  ?  ?  ?General Comments: pt's daughter reports no cognitive impairments at baseline ?  ?  ? ?  ?General Comments   ? ?  ?Exercises General Exercises - Lower Extremity ?Ankle Circles/Pumps: Both, 20 reps, Supine ?Long Arc Quad: AAROM, Both, 10 reps, Seated ?Heel Slides: AAROM, Both, 10 reps, Supine ?Hip ABduction/ADduction: AAROM, Both, 10 reps, Supine ?Straight Leg Raises: AAROM, Both, 10 reps, Supine  ? ?Assessment/Plan  ?  ?PT Assessment Patient needs continued PT services  ?PT Problem List Decreased strength;Decreased activity tolerance;Decreased balance;Decreased mobility;Pain ? ?   ?  ?PT Treatment Interventions DME instruction;Gait training;Functional mobility training;Therapeutic activities;Therapeutic exercise;Balance training;Patient/family education   ? ?PT Goals (Current goals can be found in the Care Plan section)  ?Acute Rehab PT Goals ?Patient Stated Goal: agreeable to rehab ?PT Goal Formulation: With patient/family ?Time For Goal  Achievement: 02/02/22 ?Potential to Achieve Goals: Fair ? ?  ?Frequency Min 3X/week ?  ? ? ?Co-evaluation   ?  ?  ?  ?  ? ? ?  ?AM-PAC PT "6 Clicks" Mobility  ?Outcome Measure Help needed turning from your back to your side while in a flat bed without using bedrails?: A Lot ?Help needed moving from lying on your back to sitting on the side of a flat bed without using bedrails?: A Lot ?Help needed moving to and from a bed to a chair (including a wheelchair)?: A Lot ?Help needed standing up from a chair using your arms (e.g., wheelchair or bedside chair)?: A Lot ?Help needed to walk in hospital room?: Total ?Help needed climbing 3-5 steps with a railing? : Total ?6 Click Score: 10 ? ?  ?End of Session   ?Activity Tolerance: Patient tolerated treatment well ?Patient left: in bed;with call bell/phone within reach;with family/visitor present ?Nurse Communication: Mobility status ?PT Visit Diagnosis: Muscle weakness (generalized) (M62.81);Difficulty in walking, not elsewhere classified (R26.2) ?  ? ?Time: 3710-6269 ?PT Time Calculation (min) (ACUTE ONLY): 36 min ? ? ?Charges:   PT Evaluation ?$PT Eval Moderate Complexity: 1 Mod ?PT Treatments ?$Therapeutic Exercise: 8-22 mins ?  ?   ? ? ?Wyona Almas, PT, DPT ?Acute Rehabilitation Services ?Pager [phone] ?Office [phone] ? ? ?  Deno Etienne ?01/19/2022, 4:47 PM ?

## 2022-01-19 NOTE — Progress Notes (Signed)
Pt has been anxious through the night, HR increases with anxiety and/or movement from a base line of 90's-100's to 120's to 140's. Pt stood at the bedside x 2 for 5 minutes with walker and 2 person assist.  Pt then sat in recliner where she currently is.  Pt has a slow shifting gait (hx: Parkinson's) ?

## 2022-01-19 NOTE — Evaluation (Signed)
Occupational Therapy Evaluation ?Patient Details ?Name: [name] [name] ?MRN: [id number] ?DOB: [birth year] ?Today's Date: 01/19/2022 ? ? ?History of Present Illness 71 yo female with 1 day of worsening abdominal pain and vomiting, s/p femoral hernia repair with SBR 2/2 gangrene on 3/23. Pt with NGT to suction. Noted poor po intake for about 6 months due to living situation. Daughter reports pt  with wt loss of 20-30# within past few months.  ? ?Clinical Impression ?  ?Pt presents with decline in function and safety with ADLs and ADL mobility with impaired strength, balance and endurance. PTA pt lived at home and has a special needs/Autistic sone living with her. Pt's son assisted minimally with set up of water for sponge bathing and transfers to [facility] prn. Pt was able to dress, feed, groom and toilet herself. Pt non verbal and PLOF infog provided by pt's daughter who is present. Pt currently requires mod - max A to sit OE< mod A to stand and SPT to chair, mod A with UB ADLs, max - total A with LB ADLs, total A with toileting. Pt would benefit from acute OT services to address impairments to maximize level of function and safety ?HR ?At rest before activity 87 ?Sitting EOB 109 ?Standing/SPT 124  ?O2 SATs remained 96-100% throughout ?   ? ?Recommendations for follow up therapy are one component of a multi-disciplinary discharge planning process, led by the attending physician.  Recommendations may be updated based on patient status, additional functional criteria and insurance authorization.  ? ?Follow Up Recommendations ? Skilled nursing-short term rehab (<3 hours/day)  ?  ?Assistance Recommended at Discharge    ?Patient can return home with the following A lot of help with bathing/dressing/bathroom;A lot of help with walking and/or transfers ? ?  ?Functional Status Assessment ? Patient has had a recent decline in their functional status and demonstrates the ability to make significant improvements in function in a  reasonable and predictable amount of time.  ?Equipment Recommendations ? Other (comment) (TBD at next venue of care)  ?  ?Recommendations for Other Services   ? ? ?  ?Precautions / Restrictions Precautions ?Precautions: Fall ?Precaution Comments: watch HR, NGT ?Restrictions ?Weight Bearing Restrictions: No  ? ?  ? ?Mobility Bed Mobility ?Overal bed mobility: Needs Assistance ?Bed Mobility: Supine to Sit ?  ?  ?Supine to sit: Mod assist, Max assist ?  ?  ?General bed mobility comments: able to initiate moving LEs to EOB, cues to use rail, mod A for LEs off EOB, max A to elevate trunk and to scoot hips forward at EOB ?  ? ?Transfers ?Overall transfer level: Needs assistance ?  ?Transfers: Sit to/from Stand, Bed to chair/wheelchair/BSC ?Sit to Stand: Mod assist ?Stand pivot transfers: Mod assist ?  ?  ?  ?  ?  ?  ? ?  ?Balance Overall balance assessment: Needs assistance ?Sitting-balance support: Bilateral upper extremity supported, Feet supported ?Sitting balance-Leahy Scale: Poor ?  ?  ?Standing balance support: During functional activity ?Standing balance-Leahy Scale: Poor ?  ?  ?  ?  ?  ?  ?  ?  ?  ?  ?  ?  ?   ? ?ADL either performed or assessed with clinical judgement  ? ?ADL Overall ADL's : Needs assistance/impaired ?Eating/Feeding: NPO ?  ?Grooming: Wash/dry hands;Wash/dry face;Sitting;Minimal assistance ?  ?Upper Body Bathing: Moderate assistance;Sitting ?  ?Lower Body Bathing: Maximal assistance;Sitting/lateral leans ?  ?Upper Body Dressing : Moderate assistance;Sitting ?  ?Lower Body Dressing:  Total assistance ?  ?Toilet Transfer: Moderate assistance;Stand-pivot;Cueing for safety;Cueing for sequencing ?Toilet Transfer Details (indicate cue type and reason): simulated to chair ?Toileting- Clothing Manipulation and Hygiene: Total assistance ?  ?  ?  ?Functional mobility during ADLs: Moderate assistance;Cueing for safety;Cueing for sequencing ?   ? ? ? ?Vision Baseline Vision/History: 1 Wears glasses ?Ability  to See in Adequate Light: 0 Adequate ?Patient Visual Report: No change from baseline ?   ?   ?Perception   ?  ?Praxis   ?  ? ?Pertinent Vitals/Pain Pain Assessment ?Pain Assessment: Faces ?Faces Pain Scale: No hurt  ? ? ? ?Hand Dominance Right ?  ?Extremity/Trunk Assessment Upper Extremity Assessment ?Upper Extremity Assessment: Generalized weakness ?  ?Lower Extremity Assessment ?Lower Extremity Assessment: Defer to PT evaluation ?  ?  ?  ?Communication Communication ?Communication: Other (comment) ?  ?Cognition Arousal/Alertness: Awake/alert ?Behavior During Therapy: [facility] for tasks assessed/performed ?Overall Cognitive Status: Within Functional Limits for tasks assessed ?  ?  ?  ?  ?  ?  ?  ?  ?  ?  ?  ?  ?  ?  ?  ?  ?General Comments: pt's daughter reports no cognitive impairments at baseline ?  ?  ?General Comments    ? ?  ?Exercises   ?  ?Shoulder Instructions    ? ? ?Home Living Family/patient expects to be discharged to:: Private residence ?Living Arrangements: Children (Autistic son lives with pt and 2 other children live close by) ?Available Help at Discharge: Family ?Type of Home: House ?Home Access: Stairs to enter ?Entrance Stairs-Number of Steps: 3-4 ?Entrance Stairs-Rails: Right ?Home Layout: One level ?  ?  ?Bathroom Shower/Tub: Tub/shower unit ?  ?Bathroom Toilet: Standard ?  ?  ?Home Equipment: Conservation officer, nature (2 wheels);Rollator (4 wheels);Cane - single point;BSC/3in1 ?  ?Additional Comments: Children assisted with groceries, meals, home mgt ?  ? ?  ?Prior Functioning/Environment Prior Level of Function : Needs assist ?  ?  ?  ?  ?  ?  ?Mobility Comments: used rollater for mobility ?ADLs Comments: Set up with sponge  bathing, was able to bathe UB, peri areas, Ind with grooming and toileting with BSC ?  ? ?  ?  ?OT Problem List: Decreased strength;Impaired balance (sitting and/or standing);Decreased activity tolerance;Decreased coordination;Decreased knowledge of use of DME or AE ?  ?   ?OT  Treatment/Interventions: Self-care/ADL training;Patient/family education;Balance training;Therapeutic activities;Neuromuscular education;DME and/or AE instruction  ?  ?OT Goals(Current goals can be found in the care plan section) Acute Rehab OT Goals ?Patient Stated Goal: pt's daughter states that she would like pt to have rehab after hospital d/c, the return home with increased caregiver assist ?OT Goal Formulation: With patient/family ?Time For Goal Achievement: 02/02/22 ?Potential to Achieve Goals: Good ?ADL Goals ?Pt Will Perform Grooming: with min guard assist;sitting ?Pt Will Perform Upper Body Bathing: with min assist;with min guard assist;sitting ?Pt Will Perform Lower Body Bathing: with mod assist;sitting/lateral leans ?Pt Will Perform Upper Body Dressing: with min assist;with min guard assist;sitting ?Pt Will Transfer to Toilet: with min assist;stand pivot transfer;bedside commode ?Pt Will Perform Toileting - Clothing Manipulation and hygiene: with max assist;with mod assist;sit to/from stand  ?OT Frequency: Min 2X/week ?  ? ?Co-evaluation   ?  ?  ?  ?  ? ?  ?AM-PAC OT "6 Clicks" Daily Activity     ?Outcome Measure Help from another person eating meals?:  (NPO) ?Help from another person taking care of personal grooming?: A Little ?  Help from another person toileting, which includes using toliet, bedpan, or urinal?: Total ?Help from another person bathing (including washing, rinsing, drying)?: A Lot ?Help from another person to put on and taking off regular upper body clothing?: A Little ?Help from another person to put on and taking off regular lower body clothing?: Total ?6 Click Score: 10 ?  ?End of Session Equipment Utilized During Treatment: Gait belt ?Nurse Communication: Mobility status ? ?Activity Tolerance: Patient limited by fatigue ?Patient left: in chair;with call bell/phone within reach;with family/visitor present ? ?OT Visit Diagnosis: Unsteadiness on feet (R26.81);Muscle weakness (generalized)  (M62.81);Other abnormalities of gait and mobility (R26.89)  ?              ?Time: 5183-3582 ?OT Time Calculation (min): 32 min ?Charges:  OT General Charges ?$OT Visit: 1 Visit ?OT Evaluation ?$OT Eval Moderate Comple

## 2022-01-19 NOTE — Progress Notes (Signed)
PHARMACY - TOTAL PARENTERAL NUTRITION CONSULT NOTE  ? ?Indication: Prolonged ileus ? ?Patient Measurements: ?Height: '5\' 7"'$  (170.2 cm) ?Weight: 43.4 kg (95 lb 10.9 oz) ?IBW/kg (Calculated) : 61.6 ?TPN AdjBW (KG): 43.4 ?Body mass index is 14.99 kg/m?. ?Usual Weight: ~115#  ? ?Assessment:  ?71 y.o. F s/p femoral hernia repair with SBR 2/2 gangrene on 3/23. Pt with NGT to suction. Noted poor po intake for about 6 months due to living situation. Daughter reports pt UBW ~115# with wt loss of 20-30# within past few months. No flatus or BM. Will start TPN for ileus in pt with severe PCM upon admission. ? ?Glucose / Insulin: CBGs < 120 - requiring no insulin ?Electrolytes: K 3.5, Phos 2.7, Mg 1.9, others wnl. Pt is at high risk of refeeding due to poor po intake PTA. ?Renal: SCr <1. ?Hepatic: alb 3.2, LFTs wnl ?Intake / Output; MIVF: UOP 475 (unsure if all charted), emesis 160 ?Current MIVF: D51/2NS at 50 ml/hr ?GI Imaging: ?3/23 Incarcerated left inguinal small-bowel hernia with high-grade small ?bowel obstruction. ?GI Surgeries / Procedures:  ?3/23 femoral hernia repair with SBR ? ?Central access: PICC to be placed 3/25 - confirmed with IV team that they can place ?TPN start date: 01/19/22 ? ?Nutritional Goals: ?Goal TPN rate is 60 mL/hr (provides 72 g of protein and 1382 kcals per day) ? ?RD Assessment: ?Estimated Needs ?Total Energy Estimated Needs: 1300-1500 ?Total Protein Estimated Needs: 65-80 grams ?Total Fluid Estimated Needs: >/= 1.5 L ? ?Current Nutrition:  ?NPO ? ?Plan:  ?Start TPN at 30 mL/hr at 1800 - starting at 1/2 goal rate - pt at risk of refeeding ?Kphos 15 mmol x 1 ?Electrolytes in TPN: Na 100 mEq/L, K 62mq/L, Ca 574m/L, Mg 42m62mL, and Phos 142m106mL. Cl:Ac 1:1 ?Add standard MVI and trace elements to TPN ?Add thiamine '100mg'$  daily x 5 days (3/25>>3/29) ?Initiate very Sensitive q6h SSI and adjust as needed  ?Reduce MIVF to 20 mL/hr at 1800 ?Monitor TPN labs daily until stable then on Mon/Thurs ? ?CaroSherlon HandingarmD, BCPS ?Please see amion for complete clinical pharmacist phone list ?01/19/2022,10:44 AM ? ?

## 2022-01-19 NOTE — Progress Notes (Signed)
Pt's daughter was talking to someone on the phone on speaker when I entered the room ? ?Praying over spirits and calling on sacrifices. Felt very seance like ? ?Daughter talked about everyone trying to kill them ? ?Was a very uncomfortable experience to walk in on ? ?Daughter kept trying to interrupt and intervene when I was communicating with the patient ? ?Daughter has not stopped calling the front desk after I told her a difference nurse would come back to hang the TPN ? ?Daughter does not understand what I am saying and repeatedly asking me the same question and other staff members and the front desk ?

## 2022-01-19 NOTE — Progress Notes (Signed)
2 Days Post-Op  ? ?Subjective/Chief Complaint: ?Patient is non-verbal, but communicative ?Indicates minimal pain ?No BM yesterday ?Patient reports no flatus ? ? ?Objective: ?Vital signs in last 24 hours: ?Temp:  [97.9 ?F (36.6 ?C)-99.8 ?F (37.7 ?C)] 97.9 ?F (36.6 ?C) (03/25 0754) ?Pulse Rate:  [99-105] 105 (03/25 0754) ?Resp:  [17-20] 17 (03/25 0754) ?BP: (103-118)/(71-84) 107/73 (03/25 0754) ?SpO2:  [98 %-100 %] 99 % (03/25 0754) ?Last BM Date :  (PTA) ? ?Intake/Output from previous day: ?03/24 0701 - 03/25 0700 ?In: 918.3 [I.V.:918.3] ?Out: 622 [Urine:775; Emesis/NG output:100] ?Intake/Output this shift: ?No intake/output data recorded. ? ?Frail appearing female in NAD ?Abd - mildly distended ?Left inguinal incision - c/d/i ? ?Lab Results:  ?Recent Labs  ?  01/18/22 ?0112 01/19/22 ?2979  ?WBC 12.5* 10.5  ?HGB 12.4 12.0  ?HCT 38.1 36.2  ?PLT 152 141*  ? ?BMET ?Recent Labs  ?  01/18/22 ?0112 01/19/22 ?0436  ?NA 137 135  ?K 3.3* 3.5  ?CL 104 101  ?CO2 25 25  ?GLUCOSE 119* 117*  ?BUN 13 20  ?CREATININE 0.63 0.75  ?CALCIUM 9.2 9.3  ? ?PT/INR ?No results for input(s): LABPROT, INR in the last 72 hours. ?ABG ?No results for input(s): PHART, HCO3 in the last 72 hours. ? ?Invalid input(s): PCO2, PO2 ? ?Studies/Results: ?CT ABDOMEN PELVIS W CONTRAST ? ?Result Date: 01/17/2022 ?CLINICAL DATA:  Bowel obstruction suspected. No bowel movement over the last several days. Enema a yesterday. EXAM: CT ABDOMEN AND PELVIS WITH CONTRAST TECHNIQUE: Multidetector CT imaging of the abdomen and pelvis was performed using the standard protocol following bolus administration of intravenous contrast. RADIATION DOSE REDUCTION: This exam was performed according to the departmental dose-optimization program which includes automated exposure control, adjustment of the mA and/or kV according to patient size and/or use of iterative reconstruction technique. CONTRAST:  64m OMNIPAQUE IOHEXOL 300 MG/ML  SOLN COMPARISON:  02/01/2020 FINDINGS: Lower  chest: Chronic scarring at the left lung base. Hepatobiliary: Multiple hemangiomas scattered throughout the liver as seen on the previous study. The largest in the right lobe measures up to 5 cm in diameter. No new or progressive lesion. No calcified gallstones. Pancreas: Normal Spleen: Normal Adrenals/Urinary Tract: Adrenal glands are normal. Right kidney is normal. 4 cm cyst of the left kidney, slightly enlarged since the previous study. No hydronephrosis. Bladder is normal. Stomach/Bowel: Stomach is distended with fluid. There is dilated fluid and air-filled small intestine consistent with small bowel obstruction. Level of obstruction is probably in the ileum. I think this may be occurring at a left inguinal hernia as below. Normal appearing appendix. The colon is largely collapsed. Vascular/Lymphatic: Aorta and IVC are normal. No retroperitoneal adenopathy. Reproductive: Uterine enlargement with a dominant leiomyoma measuring 7.5 cm in diameter. No adnexal mass. Other: Small amount of free fluid. Musculoskeletal: Left inguinal hernia that appears to contain a small amount of fluid but also a more hyperdense structure that I think is incarcerated small bowel. This is probably the location of the small bowel obstruction. IMPRESSION: Incarcerated left inguinal small-bowel hernia with high-grade small bowel obstruction. Multiple hemangiomas of the liver as seen chronically. 7-8 cm uterine leiomyoma as seen previously. Electronically Signed   By: MNelson ChimesM.D.   On: 01/17/2022 17:18  ? ?DG Abd Portable 1V ? ?Result Date: 01/17/2022 ?CLINICAL DATA:  NG tube placement EXAM: PORTABLE ABDOMEN - 1 VIEW COMPARISON:  CT 03/19/2022 FINDINGS: Esophageal tube coiled in the proximal stomach. Atelectasis in the left lung base. Partially visualized gaseous distension  of bowel in the upper abdomen IMPRESSION: Esophageal tube is coiled within the fundus of the stomach. Electronically Signed   By: Donavan Foil M.D.   On:  01/17/2022 22:40  ? ?[place] EKG SITE RITE ? ?Result Date: 01/19/2022 ?If [employer] not attached, placement could not be confirmed due to current cardiac rhythm.  ? ?Anti-infectives: ?Anti-infectives (From admission, onward)  ? ? None  ? ?  ? ? ?Assessment/Plan: ?Incarcerated left femoral hernia with strangulated small bowel ?S/p emergent left femoral hernia repair with Phasix mesh/ small bowel resection 01/17/22 - Dr. Kieth Brightly ? ?Awaiting return of bowel function ?Protein-calorie malnutrition - severe ?Will place PICC line and initiate TPN today ?PT/OT consults ? ?FEN: NPO, IVF, NG/ TPN ?ID: None ?VTE: SCDs, Lovenox ?Foley: None ?Dispo: Continued care on floor, awaiting return of bowel function ? LOS: 2 days  ? ? ?[name] [name] ?01/19/2022 ? ?

## 2022-01-20 DIAGNOSIS — K4130 Unilateral femoral hernia, with obstruction, without gangrene, not specified as recurrent: Secondary | ICD-10-CM

## 2022-01-20 DIAGNOSIS — R627 Adult failure to thrive: Secondary | ICD-10-CM | POA: Diagnosis present

## 2022-01-20 DIAGNOSIS — L8990 Pressure ulcer of unspecified site, unspecified stage: Secondary | ICD-10-CM | POA: Insufficient documentation

## 2022-01-20 DIAGNOSIS — E43 Unspecified severe protein-calorie malnutrition: Secondary | ICD-10-CM | POA: Insufficient documentation

## 2022-01-20 LAB — MAGNESIUM: Magnesium: 2 mg/dL (ref 1.7–2.4)

## 2022-01-20 LAB — COMPREHENSIVE METABOLIC PANEL
ALT: 7 U/L (ref 0–44)
AST: 13 U/L — ABNORMAL LOW (ref 15–41)
Albumin: 2.7 g/dL — ABNORMAL LOW (ref 3.5–5.0)
Alkaline Phosphatase: 35 U/L — ABNORMAL LOW (ref 38–126)
Anion gap: 7 (ref 5–15)
BUN: 20 mg/dL (ref 8–23)
CO2: 29 mmol/L (ref 22–32)
Calcium: 9.1 mg/dL (ref 8.9–10.3)
Chloride: 99 mmol/L (ref 98–111)
Creatinine, Ser: 0.53 mg/dL (ref 0.44–1.00)
GFR, Estimated: >60 mL/min (ref >60)
Glucose, Bld: 131 mg/dL — ABNORMAL HIGH (ref 70–99)
Potassium: 3.4 mmol/L — ABNORMAL LOW (ref 3.5–5.1)
Sodium: 135 mmol/L (ref 135–145)
Total Bilirubin: 1.4 mg/dL — ABNORMAL HIGH (ref 0.3–1.2)
Total Protein: 5.6 g/dL — ABNORMAL LOW (ref 6.5–8.1)

## 2022-01-20 LAB — GLUCOSE, CAPILLARY
Glucose-Capillary: 133 mg/dL — ABNORMAL HIGH (ref 70–99)
Glucose-Capillary: 127 mg/dL — ABNORMAL HIGH (ref 70–99)
Glucose-Capillary: 119 mg/dL — ABNORMAL HIGH (ref 70–99)
Glucose-Capillary: 125 mg/dL — ABNORMAL HIGH (ref 70–99)
Glucose-Capillary: 130 mg/dL — ABNORMAL HIGH (ref 70–99)

## 2022-01-20 LAB — TRIGLYCERIDES: Triglycerides: 46 mg/dL (ref <150)

## 2022-01-20 LAB — CBC
HCT: 33.5 % — ABNORMAL LOW (ref 36.0–46.0)
Hemoglobin: 11.2 g/dL — ABNORMAL LOW (ref 12.0–15.0)
MCH: 30.2 pg (ref 26.0–34.0)
MCHC: 33.4 g/dL (ref 30.0–36.0)
MCV: 90.3 fL (ref 80.0–100.0)
Platelets: 123 10*3/uL — ABNORMAL LOW (ref 150–400)
RBC: 3.71 MIL/uL — ABNORMAL LOW (ref 3.87–5.11)
RDW: 13 % (ref 11.5–15.5)
WBC: 5.2 10*3/uL (ref 4.0–10.5)
nRBC: 0 % (ref 0.0–0.2)

## 2022-01-20 LAB — PHOSPHORUS: Phosphorus: 2.2 mg/dL — ABNORMAL LOW (ref 2.5–4.6)

## 2022-01-20 MED ORDER — TRAVASOL 10 % IV SOLN
INTRAVENOUS | Status: AC
  Filled 2022-01-20: qty 720

## 2022-01-20 MED ORDER — POTASSIUM PHOSPHATES 15 MMOLE/5ML IV SOLN
20.0000 mmol | Freq: Once | INTRAVENOUS | Status: AC
  Administered 2022-01-20: 20 mmol via INTRAVENOUS
  Filled 2022-01-20: qty 6.67

## 2022-01-20 MED ORDER — DEXTROSE-NACL 5-0.45 % IV SOLN: INTRAVENOUS | Status: DC

## 2022-01-20 MED ORDER — HALOPERIDOL LACTATE 5 MG/ML IJ SOLN: 5.0000 mg | Freq: Four times a day (QID) | INTRAMUSCULAR | Status: DC | PRN

## 2022-01-20 MED ORDER — POTASSIUM PHOSPHATES 15 MMOLE/5ML IV SOLN
15.0000 mmol | Freq: Once | INTRAVENOUS | Status: DC
  Filled 2022-01-20: qty 5

## 2022-01-20 MED ORDER — DIPHENHYDRAMINE HCL 50 MG/ML IJ SOLN
12.5000 mg | Freq: Four times a day (QID) | INTRAMUSCULAR | Status: DC | PRN
  Administered 2022-01-20: 12.5 mg via INTRAVENOUS
  Filled 2022-01-20: qty 1

## 2022-01-20 MED ORDER — SERTRALINE HCL 50 MG PO TABS
25.0000 mg | ORAL_TABLET | Freq: Every day | ORAL | Status: DC
Start: 2022-01-20 — End: 2022-01-22
  Administered 2022-01-22: 25 mg via ORAL
  Filled 2022-01-20 (×3): qty 1
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
## 2022-01-20 NOTE — Progress Notes (Addendum)
Pt sat up on edge of bed for 30+ min, leaning on bedside table.  Assisted with bath at bedside, stood up with walker and two assist for linen change.  Weak but very motivated.  Pt also endorses passing flatus this afternoon. ?

## 2022-01-20 NOTE — Consult Note (Signed)
Initial Consultation Note ? ? ?Patient: [name] [name]:[id number] DOB: 01-11-1951 PCP: Vivi Barrack, MD ?DOA: 01/17/2022 ?DOS: the patient was seen and examined on 01/20/2022 ?Primary service: Edison Pace, Md, MD ? ?Referring physician: [place] - surgery ?Reason for consult: Dead bowel in hernia, s/p repair.  Has prolonged ileus, started on TPN.  Medical management consult requested. ? ? ?Assessment and Plan: ?* Femoral hernia of left side with obstruction ?-Patient presenting with necrotic bowel, incarcerated hernia s/p repair ?-Management per surgery ?-Has NG tube in place ?-On TPN ? ?Failure to thrive in adult ?-Patient with cachexia and frailty that clearly predates current hospitalization ?-This may be associated with Parkinson's, anxiety, other ?-Multidisciplinary care with PT/OT/ST/nutrition evaluations ?-Her family is providing 24/7 caregiver assistance and yet she is requiring more care over time; she may benefit from SNF placement ?-Will request palliative care evaluation for goals of care ? ?Anxiety ?-Previously failed Valium, BuSpar, Lexapro ?-She would likely benefit from Remeron or atypical antipsychotic to improve mood, sleep once no longer on TPN/NPO ?-For now, will start Zoloft ? ?Parkinson disease ([place]) ?-Baseline Parkinson's ?-This appears to be unrelated to her inability to speak ?-Continue Sinemet ? ? ? ? ? ? ?TRH will continue to follow the patient. ? ?HPI: CLARABEL MARION is a 71 y.o. female with past medical history of Parkinson's  and anxiety who presented on 3/23 with abdominal pain.  She was found to have a L inguinal hernia with obstruction and gangrene, s/p open repair and small bowel resection on 3/23.  In discussion with her daughter, the patient has been failing for some time - losing weight, anxiety (not improved with Buspar, previously failed Lexapro), insomnia.  She has Parkinson's and has had stomach problems for some time.  She was found to have necrotic bowel in a hernia and now has  an NG tube.  Her daughter reports that since placement of the NG tube, the patient has been unable to talk.  She was verbal prior to the NG tube.  The patient is eager to get better because of her family.  She previously lived alone but moved in with her son due to ongoing FTT and now is willing to go to SNF for rehab. ? ?Review of Systems: unable to review all systems due to the inability of the patient to answer questions. ?Past Medical History:  ?Diagnosis Date  ? Fibroid   ? GERD (gastroesophageal reflux disease)   ? Muscle weakness of lower extremity 01/17/2015  ? Osteopenia   ? Parkinson disease ([place]) 01/17/2015  ? ?Past Surgical History:  ?Procedure Laterality Date  ? FINGER SURGERY    ? INGUINAL HERNIA REPAIR Left 01/17/2022  ? Procedure: Left femoral hernia repair with mesh and small bowel resection and anastomosis;  Surgeon: Kieth Brightly, Arta Bruce, MD;  Location: [place] [facility];  Service: General;  Laterality: Left;  ? MOUTH SURGERY    ? TOE SURGERY    ? TONSILLECTOMY    ? TUBAL LIGATION    ? x2  ? ?Social History:  reports that she has never smoked. She has never used smokeless tobacco. She reports that she does not drink alcohol and does not use drugs. ? ?Allergies  ?Allergen Reactions  ? Sulfa Antibiotics Other (See Comments)  ?  Childhood allergy   ? ? ?Family History  ?Problem Relation Age of Onset  ? Rheum arthritis Mother   ? Breast cancer Mother   ?     7's  ? Cancer Father   ?  throat  ? Breast cancer Sister   ?     58's  ? Hypertension Brother   ? Hyperlipidemia Brother   ? Heart attack Son   ?     Deceased, 67  ? Healthy Daughter   ? Parkinsonism Neg Hx   ? ? ?Prior to Admission medications   ?Medication Sig Start Date End Date Taking? Authorizing Provider  ?Ascorbic Acid (VITAMIN C PO) Take 1 tablet by mouth daily.   Yes [provider]  ?b complex vitamins capsule Take 1 capsule by mouth daily.   Yes [provider]  ?busPIRone (BUSPAR) 5 MG tablet TAKE ONE TABLET BY MOUTH TWICE  DAILY ?Patient taking differently: Take 5 mg by mouth 2 (two) times daily. 01/04/22  Yes Vivi Barrack, MD  ?Carbidopa-Levodopa ER (SINEMET CR) 25-100 MG tablet controlled release Take 2 tablets by mouth at bedtime. 01/05/22  Yes [provider]  ?Cholecalciferol (VITAMIN D) 2000 UNITS CAPS Take 2,000 Units by mouth daily.   Yes [provider]  ?diazepam (VALIUM) 2 MG tablet Take 0.5 tablets (1 mg total) by mouth every 12 (twelve) hours as needed (vertigo). 09/25/21  Yes Vivi Barrack, MD  ?glucosamine-chondroitin 500-400 MG tablet Take 1 tablet by mouth daily.   Yes [provider]  ?ibuprofen (ADVIL,MOTRIN) 200 MG tablet Take 400 mg by mouth every 6 (six) hours as needed for moderate pain.    Yes [provider]  ?Magnesium Hydroxide (MAGNESIA PO) Take 1 tablet by mouth daily.   Yes [provider]  ?Multiple Vitamin (MULTIVITAMIN) capsule Take 1 capsule by mouth daily.   Yes [provider]  ?Omega-3 Fatty Acids (OMEGA 3 PO) Take 1 tablet by mouth daily.   Yes [provider]  ?vitamin B-12 (CYANOCOBALAMIN) 1000 MCG tablet Take 1,000 mcg by mouth daily.   Yes [provider]  ?carbidopa-levodopa (SINEMET IR) 25-250 MG tablet Take 1 tablet by mouth 3 (three) times daily. ?Patient not taking: Reported on 01/18/2022 07/10/21   Vivi Barrack, MD  ?tetrahydrozoline-zinc (VISINE-AC) 0.05-0.25 % ophthalmic solution Place 2 drops into both eyes 4 (four) times daily as needed (allergies).    [provider]  ?amantadine (SYMMETREL) 100 MG capsule Take 1 capsule (100 mg total) by mouth 2 (two) times daily. ?Patient not taking: Reported on 09/21/2019 08/05/19 02/01/20  Vivi Barrack, MD  ? ? ?Physical Exam: ?Vitals:  ? 01/19/22 2315 01/20/22 0323 01/20/22 0753 01/20/22 1740  ?BP: (!) 129/105 126/81 123/79 112/89  ?Pulse: 79 89 92 (!) 111  ?Resp: '19 16 14 18  '$ ?Temp: 98.2 ?F (36.8 ?C) 97.9 ?F (36.6 ?C) 97.8 ?F (36.6 ?C) 98.6 ?F (37 ?C)   ?TempSrc: Oral Oral Oral Oral  ?SpO2: 94% 100% 100% 99%  ?Weight:      ?Height:      ? ? ?General:  Appears calm and comfortable and is in NAD; she is quite frail and cachectic, chronically ill-appearing ?Eyes:   EOMI, normal lids, iris ?ENT:  grossly normal hearing, lips & tongue, mmm; NG tube in place and draining bilious brown liquid ?Neck:  no LAD, masses or thyromegaly ?Cardiovascular:  RRR, no m/r/g. No LE edema.  ?Respiratory:   CTA bilaterally with no wheezes/rales/rhonchi.  Normal respiratory effort. ?Abdomen:  soft, NT, ND; L inguinal incision is healing well ?Skin:  no rash or induration seen on limited exam ?Musculoskeletal:  decreased tone BUE/BLE, no bony abnormality ?Psychiatric:  blunted and anxious mood and affect, nonverbal, writes (poorly)  to express thoughts ?Neurologic:  unable to effectively evaluate ? ? ?Radiological Exams on Admission: ?Independently reviewed - see discussion in A/P where applicable ? ?[place] EKG SITE RITE ? ?Result Date: 01/19/2022 ?If [employer] not attached, placement could not be confirmed due to current cardiac rhythm.  ? ?EKG: none ? ? ?Labs on Admission: I have personally reviewed the available labs and imaging studies at the time of the admission. ? ?Pertinent labs:   ? ?K+ 3.4 ?Glucose 131 ?Phos 2.2 ?Albumin 2.7 ?Unremarkable CBC ?A1c 5.4 ?COVID/flu negative ? ?Family Communication: Daughter was present throughout evaluation ?Primary team communication: I spoke directly with Dr. Georgette Dover at the time of consult request ? ?Thank you very much for involving [place] in the care of your patient. ? ?Author: ?Karmen Bongo, MD ?01/20/2022 7:19 PM ? ?For on call review [email or web address].  ?

## 2022-01-20 NOTE — Assessment & Plan Note (Addendum)
-  Severely malnourished baseline clearly predates her acute illness. BMI is < 15.  ?-Palliative have been consulted, currently plan is for aggressive measures. ?-Continue TPN ?-Cortrak was placed ?-Speech therapy consulted and following ?

## 2022-01-20 NOTE — Assessment & Plan Note (Addendum)
s/p left femoral hernia repair with mesh and small bowel resection 01/17/2022.  Postoperative fistula now resolved.  CT of the abdomen pelvis 4/5 concerning for enterocutaneous fistula.  Fistula output is decreased.  Plan for possible repeat CT of abdomen tomorrow. ?-Continuing antibiotics of Zosyn ?-Management per surgery ?

## 2022-01-20 NOTE — Progress Notes (Signed)
PHARMACY - TOTAL PARENTERAL NUTRITION CONSULT NOTE  ? ?Indication: Prolonged ileus ? ?Patient Measurements: ?Height: '5\' 7"'$  (170.2 cm) ?Weight: 43.4 kg (95 lb 10.9 oz) ?IBW/kg (Calculated) : 61.6 ?TPN AdjBW (KG): 43.4 ?Body mass index is 14.99 kg/m?. ?Usual Weight: ~115#  ? ?Assessment:  ?71 y.o. F s/p femoral hernia repair with SBR 2/2 gangrene on 3/23. Pt with NGT to suction. Noted poor po intake for about 6 months due to living situation. Daughter reports pt UBW ~115# with wt loss of 20-30# within past few months. No flatus or BM. Will start TPN for ileus in pt with severe PCM upon admission. ? ?Glucose / Insulin: BGs < 140 - requiring no insulin. No CBGs done yet -order in for q6h- asked RN and she said she thinks they were just missed yesterday but she will make sure that they are done going forward. ?Electrolytes: K down slightly to 3.4, Phos also down 2.2, Corr Ca10.1, others wnl. Pt is at high risk of refeeding due to poor po intake PTA. ?Renal: SCr <1 ?Hepatic: alb 2.7, LFTs wnl except tbili slightly elevated to 1.4 ?Intake / Output; MIVF: UOP 230m (not all charted). "Other" outpt appears to be NGT o/p = 800 ml ?Current MIVF: D51/2NS at 20 ml/hr ?GI Imaging: ?3/23 Incarcerated left inguinal small-bowel hernia with high-grade small ?bowel obstruction. ?GI Surgeries / Procedures:  ?3/23 femoral hernia repair with SBR ? ?Central access: PICC to be placed 3/25 - confirmed with IV team that they can place ?TPN start date: 01/19/22 ? ?Nutritional Goals: ?Goal TPN rate is 60 mL/hr (provides 72 g of protein and 1382 kcals per day) ? ?RD Assessment: ?Estimated Needs ?Total Energy Estimated Needs: 1300-1500 ?Total Protein Estimated Needs: 65-80 grams ?Total Fluid Estimated Needs: >/= 1.5 L ? ?Current Nutrition:  ?NPO ?TPN at 387mhr - providing 36 gm protein and 691 kcal ? ?Plan:  ?Increase TPN to goal rate of 60 mL/hr at 1800 ?Kphos 20 mmol x 1 ?Electrolytes in TPN: Increase K to 65 mEq/L and Phos to 20 mmol/L.  Continue Na 100 mEq/L, Ca 51m37mL, Mg 51mE32m. Cl:Ac 1:1 ?Add standard MVI and trace elements to TPN ?Add thiamine '100mg'$  daily x 5 days (3/25>>3/29) ?Initiate very Sensitive q6h SSI and adjust as needed  ?Reduce MIVF to 10 mL/hr at 1800 ?Monitor TPN labs daily until stable then on Mon/Thurs ? ?CaroSherlon HandingarmD, BCPS ?Please see amion for complete clinical pharmacist phone list ?01/20/2022,7:20 AM ? ?

## 2022-01-20 NOTE — Assessment & Plan Note (Addendum)
Patient reports increased rigidity at around 10 PM at night.  Current regimen of Sinemet IR 25- 250 mg 3 times daily with last dose at 1700. ?-Trial of increasing Sinemet to 4 times daily with last dose to be given at 10 PM ?

## 2022-01-20 NOTE — Assessment & Plan Note (Addendum)
Patient had been on buspar '5mg'$  BID and valium '2mg'$  (though this was prn vertigo) in the outpatient setting.   ?- Appreciate psychiatry evaluations. Initiated low dose remeron in order to address anxiety, insomnia, and to stimulate appetite.  Medications ultimately had to be held due to acute respiratory failure. ?-Currently on Seroquel 25 mg nightly.  Increase dose to 50 mg nightly to see if patient able to obtain better sleep.  We will need to continue this medication if helps is 3 doses ordered. ?

## 2022-01-20 NOTE — Progress Notes (Signed)
3 Days Post-Op  ? ?Subjective/Chief Complaint: ?Daughter at bedside, another on phone ?No flatus yet ?Did not get much rest last night ?Family requesting stopping Buspar, since it was causing problems with mental status even prior to admission. ?TNA initiated yesterday ? ?Objective: ?Vital signs in last 24 hours: ?Temp:  [97.8 ?F (36.6 ?C)-99.4 ?F (37.4 ?C)] 97.8 ?F (36.6 ?C) (03/26 0753) ?Pulse Rate:  [79-106] 92 (03/26 0753) ?Resp:  [14-20] 14 (03/26 0753) ?BP: (123-129)/(79-105) 123/79 (03/26 0753) ?SpO2:  [94 %-100 %] 100 % (03/26 0753) ?Last BM Date :  (PTA) ? ?Intake/Output from previous day: ?03/25 0701 - 03/26 0700 ?In: 352.5 [I.V.:352.5] ?Out: 1100 [Urine:250] ?Intake/Output this shift: ?Total I/O ?In: 127.5 [I.V.:127.5] ?Out: -  ? ?Frail appearing female in NAD ?Abd - mildly distended ?Left inguinal incision - c/d/i ? ?Lab Results:  ?Recent Labs  ?  01/19/22 ?0436 01/20/22 ?0130  ?WBC 10.5 5.2  ?HGB 12.0 11.2*  ?HCT 36.2 33.5*  ?PLT 141* 123*  ? ?BMET ?Recent Labs  ?  01/19/22 ?0436 01/20/22 ?0130  ?NA 135 135  ?K 3.5 3.4*  ?CL 101 99  ?CO2 25 29  ?GLUCOSE 117* 131*  ?BUN 20 20  ?CREATININE 0.75 0.53  ?CALCIUM 9.3 9.1  ? ?PT/INR ?No results for input(s): LABPROT, INR in the last 72 hours. ?ABG ?No results for input(s): PHART, HCO3 in the last 72 hours. ? ?Invalid input(s): PCO2, PO2 ? ?Studies/Results: ?[place] EKG SITE RITE ? ?Result Date: 01/19/2022 ?If [employer] not attached, placement could not be confirmed due to current cardiac rhythm.  ? ?Anti-infectives: ?Anti-infectives (From admission, onward)  ? ? None  ? ?  ? ? ?Assessment/Plan: ?Incarcerated left femoral hernia with strangulated small bowel ?S/p emergent left femoral hernia repair with Phasix mesh/ small bowel resection 01/17/22 - Dr. Kieth Brightly ?  ?Awaiting return of bowel function ?Protein-calorie malnutrition - severe ?TPN ?PT/OT consults ?  ?FEN: NPO, IVF, NG/ TPN ?ID: None ?VTE: SCDs, Lovenox ?Foley: None ?Dispo: Continued care on floor,  awaiting return of bowel function ? ?Will consult TRH to help with medical management. ? LOS: 3 days  ? ? ?[name] [name] ?01/20/2022 ? ?

## 2022-01-20 NOTE — [place] FL2 (Signed)
?[place] MEDICAID FL2 LEVEL OF CARE SCREENING TOOL  ?  ? ?IDENTIFICATION  ?Patient Name: ?[name] [name] Birthdate: Oct 31, 1950 Sex: female Admission Date (Current Location): ?01/17/2022  ?[place] and [place] Number: ? [place] ?  Facility and Address:  ?[facility], [place] [address] [place], [place] [address] ?     Provider Number: ?[id number]  ?Attending Physician Name and Address:  ?Ccs, Md, MD ? Relative Name and Phone Number:  ?Matthews-Turner,Melvinie Daughter   [phone] ?   ?Current Level of Care: ?Hospital Recommended Level of Care: ?[place] Prior Approval Number: ?  ? ?Date Approved/Denied: ?  PASRR Number: ?[id number] A ? ?Discharge Plan: ?SNF ?  ? ?Current Diagnoses: ?Patient Active Problem List  ? Diagnosis Date Noted  ? Protein-calorie malnutrition, severe 01/20/2022  ? Pressure injury of skin 01/20/2022  ? Femoral hernia of left side with obstruction 01/17/2022  ? DDD (degenerative disc disease), cervical 06/18/2019  ? Constipation 03/02/2019  ? Seasonal allergies 03/02/2019  ? Anxiety 05/07/2018  ? Fatigue 04/20/2018  ? Osteoarthritis 04/20/2018  ? Palpitations 04/20/2018  ? Cardiomegaly 04/20/2018  ? Lesion of liver 04/20/2018  ? Lung nodule 04/20/2018  ? Muscle weakness of lower extremity 01/17/2015  ? Parkinson disease ([place]) 01/17/2015  ? Fibroid uterus 11/13/2012  ? Benign colonic polyp 10/02/2012  ? ? ?Orientation RESPIRATION BLADDER Height & Weight   ?  ?Self, Time, Situation, Place ? Normal Continent Weight: 95 lb 10.9 oz (43.4 kg) ?Height:  '5\' 7"'$  (170.2 cm)  ?BEHAVIORAL SYMPTOMS/MOOD NEUROLOGICAL BOWEL NUTRITION STATUS  ?    Continent Diet (see discharge summary)  ?AMBULATORY STATUS COMMUNICATION OF NEEDS Skin   ?Total Care Verbally Surgical wounds ?  ?  ?  ?    ?     ?     ? ? ?Personal Care Assistance Level of Assistance  ?Bathing, Feeding, Dressing, Total care Bathing Assistance: Maximum assistance ?Feeding assistance: Limited assistance ?Dressing  Assistance: Maximum assistance ?Total Care Assistance: Maximum assistance  ? ?Functional Limitations Info  ?Sight, Hearing, Speech Sight Info: Adequate ?Hearing Info: Adequate ?Speech Info: Adequate  ? ? ?SPECIAL CARE FACTORS FREQUENCY  ?PT (By licensed PT), OT (By licensed OT)   ?  ?PT Frequency: 5x week ?OT Frequency: 5x week ?  ?  ?  ?   ? ? ?Contractures Contractures Info: Not present  ? ? ?Additional Factors Info  ?Code Status, Allergies, Insulin Sliding Scale Code Status Info: full ?Allergies Info: Sulfa Antibiotics ?  ?Insulin Sliding Scale Info: Novolog, 0-6 units q4.  See discharge summary ?  ?   ? ?Current Medications (01/20/2022):  This is the current hospital active medication list ?Current Facility-Administered Medications  ?Medication Dose Route Frequency Provider Last Rate Last Admin  ? acetaminophen (TYLENOL) tablet 650 mg  650 mg Oral Q6H PRN Kinsinger, Arta Bruce, MD   650 mg at 01/20/22 0825  ? Or  ? acetaminophen (TYLENOL) suppository 650 mg  650 mg Rectal Q6H PRN Kinsinger, Arta Bruce, MD      ? ascorbic acid (VITAMIN C) tablet 500 mg  500 mg Oral Daily Kinsinger, Arta Bruce, MD   500 mg at 01/20/22 0825  ? carbidopa-levodopa (SINEMET IR) 25-250 MG per tablet immediate release 1 tablet  1 tablet Oral TID Kinsinger, Arta Bruce, MD   1 tablet at 01/20/22 0825  ? Chlorhexidine Gluconate Cloth 2 % PADS 6 each  6 each Topical Daily Donnie Mesa, MD   6 each at 01/19/22 1422  ?  dextrose 5 %-0.45 % sodium chloride infusion   Intravenous Continuous Franky Macho, RPH 20 mL/hr at 01/19/22 2318 New Bag at 01/19/22 2318  ? dextrose 5 %-0.45 % sodium chloride infusion   Intravenous Continuous Franky Macho, RPH      ? diphenhydrAMINE (BENADRYL) injection 12.5 mg  12.5 mg Intravenous Q6H PRN Donnie Mesa, MD      ? enoxaparin (LOVENOX) injection 30 mg  30 mg Subcutaneous Q24H Kinsinger, Arta Bruce, MD   30 mg at 01/19/22 2103  ? insulin aspart (novoLOG) injection 0-6 Units  0-6 Units Subcutaneous Q6H  Franky Macho, RPH      ? metoprolol tartrate (LOPRESSOR) injection 5 mg  5 mg Intravenous Q6H PRN Kinsinger, Arta Bruce, MD      ? morphine (PF) 2 MG/ML injection 2 mg  2 mg Intravenous Q2H PRN Kinsinger, Arta Bruce, MD      ? ondansetron (ZOFRAN-ODT) disintegrating tablet 4 mg  4 mg Oral Q6H PRN Kinsinger, Arta Bruce, MD      ? Or  ? ondansetron [facility]) injection 4 mg  4 mg Intravenous Q6H PRN Kinsinger, Arta Bruce, MD      ? oxyCODONE (Oxy IR/ROXICODONE) immediate release tablet 5 mg  5 mg Oral Q4H PRN Kinsinger, Arta Bruce, MD      ? potassium PHOSPHATE 20 mmol in dextrose 5 % 500 mL infusion  20 mmol Intravenous Once Franky Macho, RPH      ? sodium chloride flush (NS) 0.9 % injection 10-40 mL  10-40 mL Intracatheter Q12H Donnie Mesa, MD   10 mL at 01/19/22 2103  ? sodium chloride flush (NS) 0.9 % injection 10-40 mL  10-40 mL Intracatheter PRN Donnie Mesa, MD      ? TPN ADULT (ION)   Intravenous Continuous TPN Franky Macho, RPH 30 mL/hr at 01/19/22 1823 Started During Downtime at 01/19/22 1823  ? TPN ADULT (ION)   Intravenous Continuous TPN Franky Macho, RPH      ? ? ? ?Discharge Medications: ?Please see discharge summary for a list of discharge medications. ? ?Relevant Imaging Results: ? ?Relevant Lab Results: ? ? ?Additional Information ?SSN: [id number].  Pt is not vaccinated for covid. ? ?Joanne Chars, LCSW ? ? ? ? ?

## 2022-01-20 NOTE — Progress Notes (Signed)
Nutrition Follow-up ? ?DOCUMENTATION CODES:  ? ?Severe malnutrition in context of social or environmental circumstances, Underweight ? ?INTERVENTION:  ?TPN per Pharmacy. ? ?Monitor magnesium, potassium, and phosphorus BID for at least 3 days, MD to replete as needed, as pt is at risk for refeeding syndrome given severe malnutrition. ? ?NUTRITION DIAGNOSIS:  ? ?Severe Malnutrition related to social / environmental circumstances as evidenced by severe muscle depletion, severe fat depletion, percent weight loss; ongoing ? ?GOAL:  ? ?Patient will meet greater than or equal to 90% of their needs; progressing ? ?MONITOR:  ? ?Diet advancement, Labs, Weight trends ? ?REASON FOR ASSESSMENT:  ? ?Malnutrition Screening Tool ?  ? ?ASSESSMENT:  ? ?71 y.o. female presented to the ED with abdominal pain with vomiting and constipation for 7 days. PMH includes Parkinson disease and GERD. Pt admitted with incarcerated left inguinal hernia w/ obstructions. ? ?3/23 - Femoral hernia repair w/ small bowel resection 2/2 gangrene; NGT to LIWS ? ?TPN initiated yesterday. Plans to increase TPN rate to goal of 60 ml/hr today at 1800 to provide 1382 kcal and 72 grams of protein. NGT in place to LIS. Continue to monitor potassium, phosphorous, magnesium as pt at risk for refeeding syndrome given severe malnutrition.  ? ?Labs and medications reviewed.  ?Potassium low at 3.4. ?Phosphorous low at 2.2. ?Magnesium WNL at 2.0. ? ?Potassium phosphate ordered being repleted.  ? ?Diet Order:   ?Diet Order   ? ?       ?  Diet NPO time specified Except for: Sips with Meds, Ice Chips  Diet effective now       ?  ? ?  ?  ? ?  ? ? ?EDUCATION NEEDS:  ? ?No education needs have been identified at this time ? ?Skin:  Skin Assessment: Reviewed RN Assessment ?Skin Integrity Issues:: Stage I ?Stage I: Coccyx ? ?Last BM:  Unknown ? ?Height:  ? ?Ht Readings from Last 1 Encounters:  ?01/17/22 '5\' 7"'$  (1.702 m)  ? ? ?Weight:  ? ?Wt Readings from Last 1 Encounters:   ?01/17/22 43.4 kg  ? ? ?Ideal Body Weight:  61.4 kg ? ?BMI:  Body mass index is 14.99 kg/m?. ? ?Estimated Nutritional Needs:  ? ?Kcal:  1300-1500 ? ?Protein:  65-80 grams ? ?Fluid:  >/= 1.5 L ? ?Corrin Parker, MS, RD, LDN ?RD pager number/after hours weekend pager number on Amion. ? ?

## 2022-01-21 ENCOUNTER — Inpatient Hospital Stay (HOSPITAL_COMMUNITY): Payer: Medicare HMO

## 2022-01-21 DIAGNOSIS — R627 Adult failure to thrive: Secondary | ICD-10-CM | POA: Diagnosis not present

## 2022-01-21 DIAGNOSIS — Z7189 Other specified counseling: Secondary | ICD-10-CM

## 2022-01-21 DIAGNOSIS — G20 Parkinson's disease: Secondary | ICD-10-CM

## 2022-01-21 DIAGNOSIS — K4130 Unilateral femoral hernia, with obstruction, without gangrene, not specified as recurrent: Secondary | ICD-10-CM | POA: Diagnosis not present

## 2022-01-21 DIAGNOSIS — F419 Anxiety disorder, unspecified: Secondary | ICD-10-CM | POA: Diagnosis not present

## 2022-01-21 LAB — CBC
HCT: 32.1 % — ABNORMAL LOW (ref 36.0–46.0)
Hemoglobin: 10.4 g/dL — ABNORMAL LOW (ref 12.0–15.0)
MCH: 29.3 pg (ref 26.0–34.0)
MCHC: 32.4 g/dL (ref 30.0–36.0)
MCV: 90.4 fL (ref 80.0–100.0)
Platelets: 148 10*3/uL — ABNORMAL LOW (ref 150–400)
RBC: 3.55 MIL/uL — ABNORMAL LOW (ref 3.87–5.11)
RDW: 12.9 % (ref 11.5–15.5)
WBC: 5 10*3/uL (ref 4.0–10.5)
nRBC: 0.6 % — ABNORMAL HIGH (ref 0.0–0.2)

## 2022-01-21 LAB — COMPREHENSIVE METABOLIC PANEL
ALT: 10 U/L (ref 0–44)
AST: 14 U/L — ABNORMAL LOW (ref 15–41)
Albumin: 2.6 g/dL — ABNORMAL LOW (ref 3.5–5.0)
Alkaline Phosphatase: 30 U/L — ABNORMAL LOW (ref 38–126)
Anion gap: 7 (ref 5–15)
BUN: 16 mg/dL (ref 8–23)
CO2: 30 mmol/L (ref 22–32)
Calcium: 8.9 mg/dL (ref 8.9–10.3)
Chloride: 100 mmol/L (ref 98–111)
Creatinine, Ser: 0.48 mg/dL (ref 0.44–1.00)
GFR, Estimated: >60 mL/min (ref >60)
Glucose, Bld: 122 mg/dL — ABNORMAL HIGH (ref 70–99)
Potassium: 3.6 mmol/L (ref 3.5–5.1)
Sodium: 137 mmol/L (ref 135–145)
Total Bilirubin: 1 mg/dL (ref 0.3–1.2)
Total Protein: 5.8 g/dL — ABNORMAL LOW (ref 6.5–8.1)

## 2022-01-21 LAB — TRIGLYCERIDES: Triglycerides: 46 mg/dL (ref <150)

## 2022-01-21 LAB — GLUCOSE, CAPILLARY
Glucose-Capillary: 123 mg/dL — ABNORMAL HIGH (ref 70–99)
Glucose-Capillary: 130 mg/dL — ABNORMAL HIGH (ref 70–99)
Glucose-Capillary: 131 mg/dL — ABNORMAL HIGH (ref 70–99)
Glucose-Capillary: 138 mg/dL — ABNORMAL HIGH (ref 70–99)
Glucose-Capillary: 139 mg/dL — ABNORMAL HIGH (ref 70–99)
Glucose-Capillary: 139 mg/dL — ABNORMAL HIGH (ref 70–99)

## 2022-01-21 LAB — SURGICAL PATHOLOGY

## 2022-01-21 LAB — MAGNESIUM: Magnesium: 1.7 mg/dL (ref 1.7–2.4)

## 2022-01-21 LAB — PHOSPHORUS: Phosphorus: 2.2 mg/dL — ABNORMAL LOW (ref 2.5–4.6)

## 2022-01-21 MED ORDER — TRAVASOL 10 % IV SOLN
INTRAVENOUS | Status: AC
  Filled 2022-01-21: qty 720

## 2022-01-21 MED ORDER — MELATONIN 3 MG PO TABS
3.0000 mg | ORAL_TABLET | Freq: Every day | ORAL | Status: DC
  Administered 2022-01-21 – 2022-01-28 (×7): 3 mg via ORAL
  Filled 2022-01-21 (×8): qty 1

## 2022-01-21 MED ORDER — QUETIAPINE FUMARATE 25 MG PO TABS
25.0000 mg | ORAL_TABLET | Freq: Every evening | ORAL | Status: DC | PRN
  Administered 2022-01-23 – 2022-01-25 (×2): 25 mg via ORAL
  Filled 2022-01-21 (×2): qty 1

## 2022-01-21 MED ORDER — OXYCODONE HCL 5 MG PO TABS
5.0000 mg | ORAL_TABLET | Freq: Four times a day (QID) | ORAL | Status: DC | PRN
  Administered 2022-01-25 – 2022-01-26 (×2): 5 mg via ORAL
  Filled 2022-01-21 (×3): qty 1

## 2022-01-21 MED ORDER — GLYCERIN (LAXATIVE) 2 G RE SUPP
1.0000 | Freq: Once | RECTAL | Status: AC
  Administered 2022-01-21: 1 via RECTAL
  Filled 2022-01-21: qty 1

## 2022-01-21 NOTE — Progress Notes (Signed)
PT Cancellation Note ? ?Patient Details ?Name: KRYSTAL TEACHEY ?MRN: [id number] ?DOB: May 06, 1951 ? ? ?Cancelled Treatment:    Reason Eval/Treat Not Completed: Other (comment) Nursing director present in room for discussion with pt/pt daughter. RN reports pt has been up to chair today. ? ?Wyona Almas, PT, DPT ?Acute Rehabilitation Services ?Pager [phone] ?Office [phone] ? ? ? ?Deno Etienne ?01/21/2022, 2:52 PM ?

## 2022-01-21 NOTE — Progress Notes (Signed)
?PROGRESS NOTE ? ? ? ?[name] [name]  MHD:[id number] DOB: 09-26-1951 DOA: 01/17/2022 ?PCP: Vivi Barrack, MD ? ? ?Brief Narrative:  ?[name] [name] is a 71 y.o. female with past medical history of Parkinson's  and anxiety who presented on 3/23 with abdominal pain.  She was found to have a L inguinal hernia with obstruction and gangrene, s/p open repair and small bowel resection on 3/23.  In discussion with her daughter, the patient has been failing for some time - losing weight, anxiety (not improved with Buspar, previously failed Lexapro), insomnia.  She has Parkinson's and has had stomach problems for some time.  She was found to have necrotic bowel in a hernia and now has an NG tube.  Her daughter reports that since placement of the NG tube, the patient has been unable to talk.  ? ?Assessment & Plan: ?  ?Principal Problem: ?  Femoral hernia of left side with obstruction ?Active Problems: ?  Parkinson disease ([place]) ?  Anxiety ?  Failure to thrive in adult ? ?* Left femoral hernia with strangulation of small bowel ?-Patient presenting with necrotic bowel, incarcerated hernia status post emergent repair 01/17/22 - Management per surgery ?-NG tube in place ?-On TPN for nutrition ?-Discontinue IV morphine, decrease oxycodone burden per family wishes and attempts to wean patient off of narcotics, patient is agreeable. ?  ?Failure to thrive in adult ?-Profound cachexia, chronic in appearance, poor appetite at baseline per family at bedside ?-Likely exacerbated by Parkinson's, advanced age and comorbid conditions ?-PT OT speech ongoing ?  ?Acute vocal cord dysfunction  ?-Minimally improving over the past few days, continue to follow clinically, supportive care ongoing ? ?Anxiety, uncontrolled ?Insomnia, uncontrolled ?-Previously failed Valium, BuSpar, Lexapro ?-Refusing Zoloft ?-We will attempt melatonin tonight with Seroquel low-dose as needed -may need to pause NG tube for medication ministration ?-Lengthy discussion  with family that given beers criteria patient's age and comorbid she was not a candidate for IV Benadryl or IV benzodiazepines as this will likely only increase her risk for hospital delirium and sundowning.  Patient in agreement to hold his medications ?  ?Parkinson disease ([place]) ?-Baseline Parkinson's ?-Continue home Sinemet ?-Acute vocal cord dysfunction unrelated to chronic Parkinson's diagnosis ? ?DVT prophylaxis: Lovenox ?Code Status: Full ?Family Communication: Daughter at bedside, daughter speaker phone at bedside ? ?Status is: Inpatient ? ?Dispo: The patient is from: Home ?             Anticipated d/c is to: To be determined ?             Anticipated d/c date is: 72+ hours ?             Patient currently not medically stable for discharge ? ?Consultants:  ?Internal medicine ? ?Procedures:  ?Left femoral hernia with obstruction and gangrene repair with absorbable mesh small bowel resection and anastomosis ? ?Antimicrobials:  ?None ? ?Subjective: ?Poor sleep hygiene overnight, despite receiving Benadryl patient had poor sleep quality, up most of the night concerning for some hospital delirium, confusion this morning per family.  Patient remains unable to speak postoperatively but remains in good spirits.  Denies overt nausea vomiting diarrhea headache fevers chills or chest pain. ? ?Objective: ?Vitals:  ? 01/20/22 2000 01/21/22 0000 01/21/22 0500 01/21/22 0504  ?BP: 127/81 (!) 141/86 125/80 125/80  ?Pulse:   (!) 114 (!) 114  ?Resp:   19 19  ?Temp: (!) 97.5 ?F (36.4 ?C) 98.5 ?F (36.9 ?C) (!) 97.5 ?F (36.4 ?C) Marland Kitchen)  97.5 ?F (36.4 ?C)  ?TempSrc: Axillary Axillary Axillary   ?SpO2:      ?Weight:      ?Height:      ? ? ?Intake/Output Summary (Last 24 hours) at 01/21/2022 0724 ?Last data filed at 01/20/2022 2106 ?Gross per 24 hour  ?Intake 127.5 ml  ?Output 1100 ml  ?Net -972.5 ml  ? ?Filed Weights  ? 01/17/22 1751 01/17/22 2331  ?Weight: 53.1 kg 43.4 kg  ? ? ?Examination: ? ?General: Cachectic female resting  comfortably in bed, no acute distress. ?HEENT: NG draining thick dark brown/green fluid. ?Lungs:  Clear to auscultate bilaterally without rhonchi, wheeze, or rales. ?Abdomen: Postsurgical bandage clean dry intact ?Extremities: Without cyanosis, clubbing, edema, or obvious deformity. ? ?Data Reviewed: I have personally reviewed following labs and imaging studies ? ?CBC: ?Recent Labs  ?Lab 01/17/22 ?1416 01/18/22 ?0112 01/19/22 ?0436 01/20/22 ?0130 01/21/22 ?0505  ?WBC 15.6* 12.5* 10.5 5.2 5.0  ?HGB 13.9 12.4 12.0 11.2* 10.4*  ?HCT 42.5 38.1 36.2 33.5* 32.1*  ?MCV 88.9 90.9 90.7 90.3 90.4  ?PLT 183 152 141* 123* 148*  ? ?Basic Metabolic Panel: ?Recent Labs  ?Lab 01/17/22 ?1416 01/18/22 ?0112 01/19/22 ?0436 01/19/22 ?4315 01/20/22 ?0130 01/21/22 ?0505  ?NA 138 137 135  --  135 137  ?K 3.5 3.3* 3.5  --  3.4* 3.6  ?CL 100 104 101  --  99 100  ?CO2 '27 25 25  '$ --  29 30  ?GLUCOSE 105* 119* 117*  --  131* 122*  ?BUN '18 13 20  '$ --  20 16  ?CREATININE 0.63 0.63 0.75  --  0.53 0.48  ?CALCIUM 10.4* 9.2 9.3  --  9.1 8.9  ?MG  --   --   --  1.9 2.0 1.7  ?PHOS  --   --   --  2.7 2.2* 2.2*  ? ?GFR: ?Estimated Creatinine Clearance: 44.2 mL/min (by C-G formula based on SCr of 0.48 mg/dL). ?Liver Function Tests: ?Recent Labs  ?Lab 01/17/22 ?1416 01/18/22 ?0112 01/19/22 ?0436 01/20/22 ?0130 01/21/22 ?0505  ?AST 14* 17 16 13* 14*  ?ALT '5 7 7 7 10  '$ ?ALKPHOS 41 36* 37* 35* 30*  ?BILITOT 1.0 1.2 1.3* 1.4* 1.0  ?PROT 7.4 5.4* 6.0* 5.6* 5.8*  ?ALBUMIN 4.3 3.3* 3.2* 2.7* 2.6*  ? ?Recent Labs  ?Lab 01/17/22 ?1416  ?LIPASE <10*  ? ?No results for input(s): AMMONIA in the last 168 hours. ?Coagulation Profile: ?No results for input(s): INR, PROTIME in the last 168 hours. ?Cardiac Enzymes: ?No results for input(s): CKTOTAL, CKMB, CKMBINDEX, TROPONINI in the last 168 hours. ?BNP (last 3 results) ?No results for input(s): PROBNP in the last 8760 hours. ?HbA1C: ?Recent Labs  ?  01/19/22 ?0436  ?HGBA1C 5.4  ? ?CBG: ?Recent Labs  ?Lab 01/20/22 ?2007  01/20/22 ?2038 01/21/22 ?0008 01/21/22 ?4008 01/21/22 ?6761  ?GLUCAP 125* 130* 139* 131* 139*  ? ?Lipid Profile: ?Recent Labs  ?  01/20/22 ?0130 01/21/22 ?0505  ?TRIG 46 46  ? ?Thyroid Function Tests: ?No results for input(s): TSH, T4TOTAL, FREET4, T3FREE, THYROIDAB in the last 72 hours. ?Anemia Panel: ?No results for input(s): VITAMINB12, FOLATE, FERRITIN, TIBC, IRON, RETICCTPCT in the last 72 hours. ?Sepsis Labs: ?No results for input(s): PROCALCITON, LATICACIDVEN in the last 168 hours. ? ?Recent Results (from the past 240 hour(s))  ?Resp Panel by RT-PCR (Flu A&B, Covid) Nasopharyngeal Swab     Status: None  ? Collection Time: 01/17/22  6:11 PM  ? Specimen: Nasopharyngeal Swab; Nasopharyngeal(NP) swabs in  vial transport medium  ?Result Value Ref Range Status  ? SARS Coronavirus 2 by RT PCR NEGATIVE NEGATIVE Final  ?  Comment: (NOTE) ?SARS-CoV-2 target nucleic acids are NOT DETECTED. ? ?The SARS-CoV-2 RNA is generally detectable in upper respiratory ?specimens during the acute phase of infection. The lowest ?concentration of SARS-CoV-2 viral copies this assay can detect is ?138 copies/mL. A negative result does not preclude SARS-Cov-2 ?infection and should not be used as the sole basis for treatment or ?other patient management decisions. A negative result may occur with  ?improper specimen collection/handling, submission of specimen other ?than nasopharyngeal swab, presence of viral mutation(s) within the ?areas targeted by this assay, and inadequate number of viral ?copies(<138 copies/mL). A negative result must be combined with ?clinical observations, patient history, and epidemiological ?information. The expected result is Negative. ? ?Fact Sheet for Patients:  ?EntrepreneurPulse.com.au ? ?Fact Sheet for Healthcare Providers:  ?IncredibleEmployment.be ? ?This test is no t yet approved or cleared by the [place] FDA and  ?has been authorized for detection and/or diagnosis  of SARS-CoV-2 by ?FDA under an Emergency Use Authorization (EUA). This EUA will remain  ?in effect (meaning this test can be used) for the duration of the ?COVID-19 declaration under Section 564(b)(1) of the Act,

## 2022-01-21 NOTE — Plan of Care (Signed)
?  Problem: Education: ?Goal: Knowledge of General Education information will improve ?Description: Including pain rating scale, medication(s)/side effects and non-pharmacologic comfort measures ?Outcome: Progressing ?  ?Problem: Health Behavior/Discharge Planning: ?Goal: Ability to manage health-related needs will improve ?Outcome: Progressing ?  ?Problem: Clinical Measurements: ?Goal: Ability to maintain clinical measurements within normal limits will improve ?Outcome: Progressing ?Goal: Will remain free from infection ?Outcome: Progressing ?Goal: Diagnostic test results will improve ?Outcome: Progressing ?Goal: Respiratory complications will improve ?Outcome: Progressing ?Goal: Cardiovascular complication will be avoided ?Outcome: Progressing ?  ?Problem: Activity: ?Goal: Risk for activity intolerance will decrease ?Outcome: Progressing ?  ?Problem: Nutrition: ?Goal: Adequate nutrition will be maintained ?Outcome: Progressing ?  ?Problem: Coping: ?Goal: Level of anxiety will decrease ?Outcome: Progressing ?  ?Problem: Safety: ?Goal: Ability to remain free from injury will improve ?Outcome: Progressing ?  ?Problem: Pain Managment: ?Goal: General experience of comfort will improve ?Outcome: Progressing ?  ?Problem: Elimination: ?Goal: Will not experience complications related to bowel motility ?Outcome: Progressing ?Goal: Will not experience complications related to urinary retention ?Outcome: Progressing ?  ?Problem: Skin Integrity: ?Goal: Demonstration of wound healing without infection will improve ?Outcome: Progressing ?  ?Problem: Clinical Measurements: ?Goal: Postoperative complications will be avoided or minimized ?Outcome: Progressing ?  ?

## 2022-01-21 NOTE — Progress Notes (Signed)
Daughter transferred from 4E by stretcher. Daughter at bedside. Bed alarm on.Call light within reach. Pt will not verbally talk. Has texted me info when she has a need. Will continue to monitor. ?

## 2022-01-21 NOTE — Progress Notes (Signed)
PHARMACY - TOTAL PARENTERAL NUTRITION CONSULT NOTE  ? ?Indication: Prolonged ileus ? ?Patient Measurements: ?Height: '5\' 7"'$  (170.2 cm) ?Weight: 43.4 kg (95 lb 10.9 oz) ?IBW/kg (Calculated) : 61.6 ?TPN AdjBW (KG): 43.4 ?Body mass index is 14.99 kg/m?. ?Usual Weight: ~115#  ? ?Assessment:  ?71 y.o. F s/p femoral hernia repair with SBR 2/2 gangrene on 3/23. Pt with NGT to suction. Noted poor po intake for about 6 months due to living situation. Daughter reports pt UBW ~115# with wt loss of 20-30# within past few months. No flatus or BM. Pharmacy consulted to start TPN for ileus in pt with severe PCM upon admission. ? ?Glucose / Insulin: BGs < 140 - requiring no insulin. Electrolytes: Phos 2.2, coCa 10, others wnl. Pt is at high risk of refeeding due to poor po intake PTA. ?Renal: SCr <1 ?Hepatic: alb 2.6, LFTs/T.bili wnl , TG 46 ?Intake / Output; MIVF: UOP 710m ?NGT o/p = 600 ml ?Current MIVF: D51/2NS at 10 ml/hr ?GI Imaging: ?3/23 Incarcerated left inguinal small-bowel hernia with high-grade small ?bowel obstruction. ?GI Surgeries / Procedures:  ?3/23 femoral hernia repair with SBR ? ?Central access: PICC placed 3/25  ?TPN start date: 01/19/22 ? ?Nutritional Goals: ?Goal TPN rate is 60 mL/hr (provides 72 g of protein and 1382 kcals per day) ? ?RD Assessment: ?Estimated Needs ?Total Energy Estimated Needs: 1300-1500 ?Total Protein Estimated Needs: 65-80 grams ?Total Fluid Estimated Needs: >/= 1.5 L ? ?Current Nutrition:  ?NPO ?TPN at 69mhr - providing 72 gm protein and 1382 kcal ? ?Plan:  ?Continue TPN at goal rate of 60 mL/hr at 1800 ?Electrolytes in TPN: Increase K to 70 mEq/L, Mag to 8 mEq/L and Phos to 24 mmol/L. Continue Na 100 mEq/L, Ca 62m34mL, and Cl:Ac 1:1 ?Continue standard MVI and trace elements to TPN ?Continue thiamine '100mg'$  daily x 5 days (3/25>>3/29) ?Continue very Sensitive q6h SSI and adjust as needed  ?Continue MIVF to 10 mL/hr at 1800 ?Monitor TPN labs daily until stable then on Mon/Thurs ? ?CarLuisa HartharmD, BCPS ?Clinical Pharmacist ?01/21/2022 10:55 AM  ? ?Please refer to [facility] pharmacy phone number ? ? ?

## 2022-01-21 NOTE — Care Management Important Message (Signed)
Important Message ? ?Patient Details  ?Name: [name] [name] ?MRN: [id number] ?Date of Birth: November 05, 1950 ? ? ?Medicare Important Message Given:  Yes ? ? ? ? ?Collyns Mcquigg ?01/21/2022, 4:03 PM ?

## 2022-01-21 NOTE — Progress Notes (Addendum)
4 Days Post-Op  ? ?Subjective/Chief Complaint: ?No family at the bedside this morning.  Patient wrote to me that she is feeling a bit better.  No flatus or BM.  Did get up with tech to side of the bed yesterday. ? ?Objective: ?Vital signs in last 24 hours: ?Temp:  [97.5 ?F (36.4 ?C)-98.6 ?F (37 ?C)] 97.6 ?F (36.4 ?C) (03/27 0938) ?Pulse Rate:  [108-114] 108 (03/27 0824) ?Resp:  [18-20] 20 (03/27 0824) ?BP: (112-141)/(74-89) 120/74 (03/27 0824) ?SpO2:  [99 %-100 %] 100 % (03/27 0824) ?Last BM Date :  (prior to admission) ? ?Intake/Output from previous day: ?03/26 0701 - 03/27 0700 ?In: 127.5 [I.V.:127.5] ?Out: 1100 [Urine:500; Emesis/NG output:600] ?Intake/Output this shift: ?No intake/output data recorded. ? ?PE: ?Gen: Frail appearing female in NAD ?Abd - mildly distended, hypoactive BS, appropriately tender, NGT with tan output, 600cc yesterday ?Left inguinal incision - c/d/i ? ?Lab Results:  ?Recent Labs  ?  01/20/22 ?0130 01/21/22 ?0505  ?WBC 5.2 5.0  ?HGB 11.2* 10.4*  ?HCT 33.5* 32.1*  ?PLT 123* 148*  ? ?BMET ?Recent Labs  ?  01/20/22 ?0130 01/21/22 ?0505  ?NA 135 137  ?K 3.4* 3.6  ?CL 99 100  ?CO2 29 30  ?GLUCOSE 131* 122*  ?BUN 20 16  ?CREATININE 0.53 0.48  ?CALCIUM 9.1 8.9  ? ?PT/INR ?No results for input(s): LABPROT, INR in the last 72 hours. ?ABG ?No results for input(s): PHART, HCO3 in the last 72 hours. ? ?Invalid input(s): PCO2, PO2 ? ?Studies/Results: ?[place] EKG SITE RITE ? ?Result Date: 01/19/2022 ?If [employer] not attached, placement could not be confirmed due to current cardiac rhythm.  ? ?Anti-infectives: ?Anti-infectives (From admission, onward)  ? ? None  ? ?  ? ? ?Assessment/Plan: ?POD 4,S/p emergent left femoral hernia repair with Phasix mesh/ small bowel resection 01/17/22 - Dr. Kieth Brightly for incarcerated left femoral hernia  ?-Awaiting return of bowel function ?-Protein-calorie malnutrition - severe, on TPN ?-PT/OT consults ?-continue NGT, check x-ray today as output is quite feculent  appearing still.  Also give glycerin suppository to see if this will help jump start her bowels ?  ?FEN: NPO, IVF, NG/TPN ?ID: None ?VTE: SCDs, Lovenox ?Foley: None ?Dispo: Continued care on floor, awaiting return of bowel function ? ?Parkinson's disease - per medicine ?Anxiety  ?FTT in adult ? LOS: 4 days  ? ? ?[name] [name] ?01/21/2022 ? ?

## 2022-01-21 NOTE — Consult Note (Signed)
? ?                                                                                ?Consultation Note ?Date: 01/21/2022  ? ?Patient Name: [name] [name]  ?DOB: 21-Dec-1950  MRN: [id number]  Age / Sex: 71 y.o., female  ?PCP: Vivi Barrack, MD ?Referring Physician: Edison Pace, Md, MD ? ?Reason for Consultation: Establishing goals of care ? ?HPI/Patient Profile: 71 y.o. female  with past medical history of Parkinson's, anxiety admitted on 01/17/2022 with abdominal pain, vomiting, constipation. ? ?Patient was admitted for obstructing femoral hernia now with post-op ileus. PMT has been consulted to assist with goals of care conversation  ? ?Clinical Assessment and Goals of Care: ? ?I have reviewed medical records including EPIC notes, labs and imaging, received report from RN, assessed the patient and then met at the bedside along with daughter [name] to discuss diagnosis prognosis, GOC, EOL wishes, disposition and options. ? ?I introduced Palliative Medicine as specialized medical care for people living with serious illness. It focuses on providing relief from the symptoms and stress of a serious illness. The goal is to improve quality of life for both the patient and the family. ? ?We discussed a brief life review of the patient and then focused on their current illness.  ? ? ?Medical History Review and Understanding: ?Patient was diagnosed with Parkinson's in 2013 and family describes long history of suffering from symptoms. Anxiety began soon after housing displacement. She is passing gas after hernia repair.  ? ?Social History: ?Patient has two daughters with her son. ? ?Functional and Nutritional State: ?Patient requires 24/7 caregiver support and has been declining over the past three to six months with 20-30lbs of weight loss. ? ? ?Advance Directives: ?A detailed discussion regarding advanced directives was had. ? ? ?Code Status: ?Concepts specific to code status, artifical feeding and hydration, and rehospitalization  were considered and discussed. ? ?Discussion: ?Patient is quite upset today about missing a dose of her carbidopa/levodopa and worsening symptoms of tightness and squeezing in her legs. We discussed with nursing and provided education on the use of NGT to administer medications. Patient's two daughters have been supported by a patient advocate and are appreciate of additional advocacy and support from PMT. They describe patient's quality of life as very poor and with excessive suffering. Patient has trouble sleeping but Benadryl is too strong and Zoloft for anxiety was denied by patient. They hope to improve her function so she can at least enjoy things like look outside the window. She is currently disturbed by symptoms every 20 minutes. They would like to address and review medications on the [facility] with charge RN at this time and request to meet again tomorrow to discuss further.   ? ? ?Discussed the importance of continued conversation with family and the medical providers regarding overall plan of care and treatment options, ensuring decisions are within the context of the patient?s values and GOCs.  ? ?  ? ?SUMMARY OF RECOMMENDATIONS   ?-Continue current care ?-Patient and family value consistent communication, particularly while she is hypophonic ?-Patient's family requests transfer to 6N or another unit ?-Psychosocial and emotional support provided ?-Family  meeting tomorrow 3/28 at 12pm ?-PMT will continue to support ? ?Prognosis:  ?Unable to determine ? ?Discharge Planning: [facility] for rehab with Palliative care service follow-up  ? ?  ? ?Primary Diagnoses: ?Present on Admission: ? Femoral hernia of left side with obstruction ? Failure to thrive in adult ? Parkinson disease ([place]) ? Anxiety ? ? ?I have reviewed the medical record, interviewed the patient and family, and examined the patient. The following aspects are pertinent. ? ?Past Medical History:  ?Diagnosis Date  ? Fibroid   ? GERD  (gastroesophageal reflux disease)   ? Muscle weakness of lower extremity 01/17/2015  ? Osteopenia   ? Parkinson disease ([place]) 01/17/2015  ? ?Social History  ? ?Socioeconomic History  ? Marital status: Divorced  ?  Spouse name: Not on file  ? Number of children: 4  ? Years of education: B.A.  ? Highest education level: Not on file  ?Occupational History  ? Occupation: SECRETARY  ?Tobacco Use  ? Smoking status: Never  ? Smokeless tobacco: Never  ?Vaping Use  ? Vaping Use: Never used  ?Substance and Sexual Activity  ? Alcohol use: No  ?  Alcohol/week: 0.0 standard drinks  ? Drug use: No  ? Sexual activity: Never  ?Other Topics Concern  ? Not on file  ?Social History Narrative  ? Patient is right handed.  ? Patient drinks 2-3 cups of caffeine per day.  ? Lives alone in a 2 story home.  Has 4 living children.  1 passed away at age 70.    ? 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 Highest level of education: college  ? ?Social Determinants of Health  ? ?Financial Resource Strain: Low Risk   ? Difficulty of Paying Living Expenses: Not hard at all  ?Food Insecurity: No Food Insecurity  ? Worried About Charity fundraiser in the Last Year: Never true  ? Ran Out of Food in the Last Year: Never true  ?Transportation Needs: No Transportation Needs  ? Lack of Transportation (Medical): No  ? Lack of Transportation (Non-Medical): No  ?Physical Activity: Unknown  ? Days of Exercise per Week: 0 days  ? Minutes of Exercise per Session: Not on file  ?Stress: No Stress Concern Present  ? Feeling of Stress : Not at all  ?Social Connections: Moderately Isolated  ? Frequency of Communication with Friends and Family: More than three times a week  ? Frequency of Social Gatherings with Friends and Family: Twice a week  ? Attends Religious Services: More than 4 times per year  ? Active Member of Clubs or Organizations: No  ? Attends Archivist Meetings: Never  ? Marital Status: Divorced  ? ?Family History   ?Problem Relation Age of Onset  ? Rheum arthritis Mother   ? Breast cancer Mother   ?     27's  ? Cancer Father   ?     throat  ? Breast cancer Sister   ?     69's  ? Hypertension Brother   ? Hyperlipidemia Brother   ? Heart attack Son   ?     Deceased, 81  ? Healthy Daughter   ? Parkinsonism Neg Hx   ? ?Scheduled Meds: ? vitamin C  500 mg Oral Daily  ? carbidopa-levodopa  1 tablet Oral TID  ? Chlorhexidine Gluconate Cloth  6 each Topical Daily  ? enoxaparin (LOVENOX) injection  30 mg Subcutaneous Q24H  ? Glycerin (  Adult)  1 suppository Rectal Once  ? insulin aspart  0-6 Units Subcutaneous Q6H  ? melatonin  3 mg Oral QHS  ? sertraline  25 mg Oral Daily  ? sodium chloride flush  10-40 mL Intracatheter Q12H  ? ?Continuous Infusions: ? dextrose 5 % and 0.45% NaCl 10 mL/hr at 01/20/22 1925  ? TPN ADULT (ION) 60 mL/hr at 01/20/22 1755  ? TPN ADULT (ION)    ? ?PRN Meds:.acetaminophen **OR** acetaminophen, metoprolol tartrate, ondansetron **OR** ondansetron (ZOFRAN) IV, oxyCODONE, [START ON 01/22/2022] QUEtiapine, sodium chloride flush ?Medications Prior to Admission:  ?Prior to Admission medications   ?Medication Sig Start Date End Date Taking? Authorizing Provider  ?Ascorbic Acid (VITAMIN C PO) Take 1 tablet by mouth daily.   Yes [provider]  ?b complex vitamins capsule Take 1 capsule by mouth daily.   Yes [provider]  ?busPIRone (BUSPAR) 5 MG tablet TAKE ONE TABLET BY MOUTH TWICE DAILY ?Patient taking differently: Take 5 mg by mouth 2 (two) times daily. 01/04/22  Yes Vivi Barrack, MD  ?Carbidopa-Levodopa ER (SINEMET CR) 25-100 MG tablet controlled release Take 2 tablets by mouth at bedtime. 01/05/22  Yes [provider]  ?Cholecalciferol (VITAMIN D) 2000 UNITS CAPS Take 2,000 Units by mouth daily.   Yes [provider]  ?diazepam (VALIUM) 2 MG tablet Take 0.5 tablets (1 mg total) by mouth every 12 (twelve) hours as needed (vertigo). 09/25/21  Yes Vivi Barrack, MD   ?glucosamine-chondroitin 500-400 MG tablet Take 1 tablet by mouth daily.   Yes [provider]  ?ibuprofen (ADVIL,MOTRIN) 200 MG tablet Take 400 mg by mouth every 6 (six) hours as needed for moderate pain

## 2022-01-21 NOTE — Evaluation (Addendum)
Speech Language Pathology Evaluation ?Patient Details ?Name: [name] [name] ?MRN: [id number] ?DOB: 1951/02/12 ?Today's Date: 01/21/2022 ?Time: 1224-4975 ?SLP Time Calculation (min) (ACUTE ONLY): 42 min ? ?Problem List:  ?Patient Active Problem List  ? Diagnosis Date Noted  ? Protein-calorie malnutrition, severe 01/20/2022  ? Pressure injury of skin 01/20/2022  ? Failure to thrive in adult 01/20/2022  ? Femoral hernia of left side with obstruction 01/17/2022  ? DDD (degenerative disc disease), cervical 06/18/2019  ? Constipation 03/02/2019  ? Seasonal allergies 03/02/2019  ? Anxiety 05/07/2018  ? Fatigue 04/20/2018  ? Osteoarthritis 04/20/2018  ? Palpitations 04/20/2018  ? Cardiomegaly 04/20/2018  ? Lesion of liver 04/20/2018  ? Lung nodule 04/20/2018  ? Muscle weakness of lower extremity 01/17/2015  ? Parkinson disease ([place]) 01/17/2015  ? Fibroid uterus 11/13/2012  ? Benign colonic polyp 10/02/2012  ? ?Past Medical History:  ?Past Medical History:  ?Diagnosis Date  ? Fibroid   ? GERD (gastroesophageal reflux disease)   ? Muscle weakness of lower extremity 01/17/2015  ? Osteopenia   ? Parkinson disease ([place]) 01/17/2015  ? ?Past Surgical History:  ?Past Surgical History:  ?Procedure Laterality Date  ? FINGER SURGERY    ? INGUINAL HERNIA REPAIR Left 01/17/2022  ? Procedure: Left femoral hernia repair with mesh and small bowel resection and anastomosis;  Surgeon: Kieth Brightly, Arta Bruce, MD;  Location: [facility];  Service: General;  Laterality: Left;  ? MOUTH SURGERY    ? TOE SURGERY    ? TONSILLECTOMY    ? TUBAL LIGATION    ? x2  ? ?HPI:  ?71 yo female with 1 day of worsening abdominal pain and vomiting, s/p femoral hernia repair with SBR 2/2 gangrene on 3/23. Pt with NGT to suction. Noted poor po intake for about 6 months due to living situation. Daughter reports pt with wt loss of 20-30# within past few months. SLP ordered as pt became acutely nonverbal this admission. Significant PMH: Parkinson's disease (prior course of OP  SLP in 2018 for dysarthria/voice, d/c after all goals were met), anxiety  ? ?Assessment / Plan / Recommendation ?Clinical Impression ?  Pt presents with primary complaints of hypophonia impacting speech intelligibility. She appears to have appropriate receptive/expressive language and mentation is functional throughout eval, although cognition was not more thoroughly challenged in light of pt and daughter without acute concerns. Note that pt has flattening of her L nasolabial fold with asymmetry on the L when she smiles. She says this started since her surgery, but that she attributed it to swelling post-op. Her daughter shares that they had been told she had some increased weakness on her L side since she has been here as well. Dr. Avon Gully and RN were both made aware of these findings. ? ?Pt's communication is most impacted by dysphonia that includes hypophonia and suspect some mild hyperresonance. Impaired resonance could potentially be impacted by presence of large bore NGT, but hypophonia started shortly PTA per pt/family report. They noted an abrupt change in vocal intensity, and pt has since been texting or writing to facilitate communication as her intelligibility is impacted at the word and phrase level. With cues to increase her volume, pt is capable of producing louder phonation, but intelligibility is still reduced, especially with unknown context. The source of her acute change in intelligibility and dysphonia is not entirely clear at this time, but SLP will continue to follow acutely to facilitate communication. Picture board was provided today. Will f/u with additional resources for multimodal communication as  able.  ?   ?SLP Assessment ? SLP Recommendation/Assessment: Patient needs continued [facility] ?SLP Visit Diagnosis: Dysarthria and anarthria (R47.1)  ?  ?Recommendations for follow up therapy are one component of a multi-disciplinary discharge planning process, led by  the attending physician.  Recommendations may be updated based on patient status, additional functional criteria and insurance authorization. ?   ?Follow Up Recommendations ? Skilled nursing-short term rehab (<3 hours/day)  ?  ?Assistance Recommended at Discharge ? Intermittent Supervision/Assistance  ?Functional Status Assessment Patient has had a recent decline in their functional status and demonstrates the ability to make significant improvements in function in a reasonable and predictable amount of time.  ?Frequency and Duration min 2x/week  ?2 weeks ?  ?   ?SLP Evaluation ?Cognition ? Overall Cognitive Status: Within Functional Limits for tasks assessed  ?  ?   ?Comprehension ? Auditory Comprehension ?Overall Auditory Comprehension: Appears within functional limits for tasks assessed ?Visual Recognition/Discrimination ?Discrimination: Within Function Limits  ?  ?Expression Expression ?Primary Mode of Expression: Verbal ?Verbal Expression ?Overall Verbal Expression: Appears within functional limits for tasks assessed   ?Oral / Motor ? Oral Motor/Sensory Function ?Overall Oral Motor/Sensory Function: Mild impairment ?Facial ROM: Reduced left;Suspected CN VII (facial) dysfunction ?Facial Symmetry: Abnormal symmetry left;Suspected CN VII (facial) dysfunction ?Facial Strength: Reduced left;Suspected CN VII (facial) dysfunction ?Lingual ROM: Within Functional Limits ?Lingual Symmetry: Within Functional Limits ?Velum: Impaired right;Impaired left ?Motor Speech ?Overall Motor Speech: Impaired ?Respiration: Within functional limits ?Phonation: Low vocal intensity ?Resonance: Hypernasality (suspect from NGT) ?Intelligibility: Intelligibility reduced ?Phrase: 50-74% accurate ?Motor Planning: Witnin functional limits ?Effective Techniques: Increased vocal intensity   ?        ? ?Osie Bond., M.A. CCC-SLP ?Acute Rehabilitation Services ?Pager [phone] ?Office [phone] ? ?01/21/2022, 6:19 PM ? ?

## 2022-01-21 NOTE — Plan of Care (Signed)
?  Problem: Health Behavior/Discharge Planning: ?Goal: Ability to manage health-related needs will improve ?Outcome: Not Progressing ?  ?Problem: Clinical Measurements: ?Goal: Will remain free from infection ?Outcome: Progressing ?Goal: Diagnostic test results will improve ?Outcome: Progressing ?Goal: Respiratory complications will improve ?Outcome: Progressing ?Goal: Cardiovascular complication will be avoided ?Outcome: Progressing ?  ?Problem: Activity: ?Goal: Risk for activity intolerance will decrease ?Outcome: Progressing ?  ?

## 2022-01-22 DIAGNOSIS — G20 Parkinson's disease: Secondary | ICD-10-CM

## 2022-01-22 DIAGNOSIS — R627 Adult failure to thrive: Secondary | ICD-10-CM | POA: Diagnosis not present

## 2022-01-22 DIAGNOSIS — Z7189 Other specified counseling: Secondary | ICD-10-CM

## 2022-01-22 DIAGNOSIS — F419 Anxiety disorder, unspecified: Secondary | ICD-10-CM

## 2022-01-22 DIAGNOSIS — K4130 Unilateral femoral hernia, with obstruction, without gangrene, not specified as recurrent: Secondary | ICD-10-CM | POA: Diagnosis not present

## 2022-01-22 LAB — COMPREHENSIVE METABOLIC PANEL
ALT: 9 U/L (ref 0–44)
AST: 12 U/L — ABNORMAL LOW (ref 15–41)
Albumin: 2.7 g/dL — ABNORMAL LOW (ref 3.5–5.0)
Alkaline Phosphatase: 34 U/L — ABNORMAL LOW (ref 38–126)
Anion gap: 7 (ref 5–15)
BUN: 19 mg/dL (ref 8–23)
CO2: 30 mmol/L (ref 22–32)
Calcium: 9.3 mg/dL (ref 8.9–10.3)
Chloride: 99 mmol/L (ref 98–111)
Creatinine, Ser: 0.51 mg/dL (ref 0.44–1.00)
GFR, Estimated: >60 mL/min (ref >60)
Glucose, Bld: 129 mg/dL — ABNORMAL HIGH (ref 70–99)
Potassium: 3.9 mmol/L (ref 3.5–5.1)
Sodium: 136 mmol/L (ref 135–145)
Total Bilirubin: 0.8 mg/dL (ref 0.3–1.2)
Total Protein: 5.8 g/dL — ABNORMAL LOW (ref 6.5–8.1)

## 2022-01-22 LAB — GLUCOSE, CAPILLARY
Glucose-Capillary: 124 mg/dL — ABNORMAL HIGH (ref 70–99)
Glucose-Capillary: 126 mg/dL — ABNORMAL HIGH (ref 70–99)
Glucose-Capillary: 128 mg/dL — ABNORMAL HIGH (ref 70–99)
Glucose-Capillary: 130 mg/dL — ABNORMAL HIGH (ref 70–99)

## 2022-01-22 LAB — CBC
HCT: 30.7 % — ABNORMAL LOW (ref 36.0–46.0)
Hemoglobin: 10.1 g/dL — ABNORMAL LOW (ref 12.0–15.0)
MCH: 29.9 pg (ref 26.0–34.0)
MCHC: 32.9 g/dL (ref 30.0–36.0)
MCV: 90.8 fL (ref 80.0–100.0)
Platelets: 153 10*3/uL (ref 150–400)
RBC: 3.38 MIL/uL — ABNORMAL LOW (ref 3.87–5.11)
RDW: 13.2 % (ref 11.5–15.5)
WBC: 6.6 10*3/uL (ref 4.0–10.5)
nRBC: 0 % (ref 0.0–0.2)

## 2022-01-22 LAB — PHOSPHORUS: Phosphorus: 3.5 mg/dL (ref 2.5–4.6)

## 2022-01-22 LAB — MAGNESIUM: Magnesium: 2 mg/dL (ref 1.7–2.4)

## 2022-01-22 MED ORDER — TRAVASOL 10 % IV SOLN
INTRAVENOUS | Status: AC
  Filled 2022-01-22: qty 720

## 2022-01-22 NOTE — Progress Notes (Signed)
Physical Therapy Treatment ?Patient Details ?Name: [name] [name] ?MRN: [id number] ?DOB: 1950/12/18 ?Today's Date: 01/22/2022 ? ? ?History of Present Illness 71 yo female with 1 day of worsening abdominal pain and vomiting, s/p femoral hernia repair with SBR 2/2 gangrene on 3/23. Pt with NGT to suction. Noted poor po intake for about 6 months due to living situation. Daughter reports pt  with wt loss of 20-30# within past few months. Significant PMH: Parkinson disease, anxiety ? ?  ?PT Comments  ? ? Pt received supine and agreeable to session requiring up to mod assist for bed mobility down to min guard to come to standing at EOB. Pt able to march in place at EOB with min guard and take a few side steps toward [facility] with min assist for RW management and cueing. Pt declining up in chair as she and daughter stating she spent time up in the recliner earlier today. Pt with good tolerance for LE therex for increased strength and ROM. Pt continues to use text to communicate with a few instances of pt very quietly verbalizing needs. Pt continues to benefit from skilled PT services to progress toward functional mobility goals.  ?  ?Recommendations for follow up therapy are one component of a multi-disciplinary discharge planning process, led by the attending physician.  Recommendations may be updated based on patient status, additional functional criteria and insurance authorization. ? ?Follow Up Recommendations ? Skilled nursing-short term rehab (<3 hours/day) ?  ?  ?Assistance Recommended at Discharge Frequent or constant Supervision/Assistance  ?Patient can return home with the following A lot of help with walking and/or transfers;A lot of help with bathing/dressing/bathroom ?  ?Equipment Recommendations ? Wheelchair cushion (measurements PT);Wheelchair (measurements PT)  ?  ?Recommendations for Other Services   ? ? ?  ?Precautions / Restrictions Precautions ?Precautions: Fall ?Precaution Comments: watch HR,  NGT ?Restrictions ?Weight Bearing Restrictions: No  ?  ? ?Mobility ? Bed Mobility ?Overal bed mobility: Needs Assistance ?Bed Mobility: Supine to Sit, Sit to Supine ?  ?  ?Supine to sit: Mod assist ?Sit to supine: Mod assist ?  ?General bed mobility comments: able to initiate moving BLE's towards edge of bed, assist at hips to scoot and trunk to upright. Assist for BLE's back into bed ?  ? ?Transfers ?Overall transfer level: Needs assistance ?Equipment used: Rolling walker (2 wheels) ?Transfers: Sit to/from Stand ?Sit to Stand: Min guard ?  ?  ?  ?  ?  ?General transfer comment: pt able to stand with out physical assist, min guard to steady RW, pt with very slow but steady rise to standing ?  ? ?Ambulation/Gait ?Ambulation/Gait assistance: Min assist ?Gait Distance (Feet): 2 Feet ?Assistive device: Rolling walker (2 wheels) ?Gait Pattern/deviations: Step-to pattern ?  ?  ?  ?General Gait Details: side stepping to [facility] ? ? ?Stairs ?  ?  ?  ?  ?  ? ? ?Wheelchair Mobility ?  ? ?Modified Rankin (Stroke Patients Only) ?  ? ? ?  ?Balance Overall balance assessment: Needs assistance ?Sitting-balance support: Bilateral upper extremity supported, Feet supported ?Sitting balance-Leahy Scale: Poor ?Sitting balance - Comments: posterior, left lateral lean ?  ?Standing balance support: During functional activity ?Standing balance-Leahy Scale: Poor ?Standing balance comment: reliance on BUE support on RW ?  ?  ?  ?  ?  ?  ?  ?  ?  ?  ?  ?  ? ?  ?Cognition Arousal/Alertness: Awake/alert ?Behavior During Therapy: [facility] for tasks assessed/performed ?Overall Cognitive  Status: Within Functional Limits for tasks assessed ?  ?  ?  ?  ?  ?  ?  ?  ?  ?  ?  ?  ?  ?  ?  ?  ?  ?  ?  ? ?  ?Exercises General Exercises - Lower Extremity ?Ankle Circles/Pumps: AROM, Both, 20 reps, Supine ?Quad Sets: AROM, Right, Left, 10 reps, Supine ?Hip ABduction/ADduction: AROM, Right, Left, 10 reps, Supine ?Straight Leg Raises: AROM, Right, Left, 20 reps,  Supine ?Hip Flexion/Marching: AROM, Right, Left, 10 reps, Supine, Standing ? ?  ?General Comments General comments (skin integrity, edema, etc.): VSS on RA ?  ?  ? ?Pertinent Vitals/Pain Pain Assessment ?Pain Assessment: Faces ?Faces Pain Scale: Hurts a little bit ?Pain Location: stomach ?Pain Descriptors / Indicators: Pressure, Other (Comment) (bloating) ?Pain Intervention(s): Limited activity within patient's tolerance, Monitored during session, Repositioned  ? ? ?Home Living   ?  ?  ?  ?  ?  ?  ?  ?  ?  ?   ?  ?Prior Function    ?  ?  ?   ? ?PT Goals (current goals can now be found in the care plan section) Acute Rehab PT Goals ?PT Goal Formulation: With patient/family ?Time For Goal Achievement: 02/02/22 ? ?  ?Frequency ? ? ? Min 3X/week ? ? ? ?  ?PT Plan    ? ? ?Co-evaluation   ?  ?  ?  ?  ? ?  ?AM-PAC PT "6 Clicks" Mobility   ?Outcome Measure ? Help needed turning from your back to your side while in a flat bed without using bedrails?: A Lot ?Help needed moving from lying on your back to sitting on the side of a flat bed without using bedrails?: A Lot ?Help needed moving to and from a bed to a chair (including a wheelchair)?: A Lot ?Help needed standing up from a chair using your arms (e.g., wheelchair or bedside chair)?: A Lot ?Help needed to walk in hospital room?: Total ?Help needed climbing 3-5 steps with a railing? : Total ?6 Click Score: 10 ? ?  ?End of Session   ?Activity Tolerance: Patient tolerated treatment well ?Patient left: in bed;with call bell/phone within reach ?Nurse Communication: Mobility status ?PT Visit Diagnosis: Muscle weakness (generalized) (M62.81);Difficulty in walking, not elsewhere classified (R26.2) ?  ? ? ?Time: 1610-9604 ?PT Time Calculation (min) (ACUTE ONLY): 26 min ? ?Charges:  $Therapeutic Exercise: 23-37 mins          ?          ? ?Audry Riles. PTA ?Acute Rehabilitation Services ?Office: [phone] ? ? ? ?Betsey Holiday Pa Tennant ?01/22/2022, 1:57 PM ? ?

## 2022-01-22 NOTE — Progress Notes (Signed)
?PROGRESS NOTE ? ? ? ?[name] [name]  MLY:[id number] DOB: 08-11-1951 DOA: 01/17/2022 ?PCP: Vivi Barrack, MD ? ? ?Brief Narrative:  ?[name] [name] is a 71 y.o. female with past medical history of Parkinson's  and anxiety who presented on 3/23 with abdominal pain.  She was found to have a L inguinal hernia with obstruction and gangrene, s/p open repair and small bowel resection on 3/23.  In discussion with her daughter, the patient has been failing for some time - losing weight, anxiety (not improved with Buspar, previously failed Lexapro), insomnia.  She has Parkinson's and has had stomach problems for some time.  She was found to have necrotic bowel in a hernia and now has an NG tube.  Her daughter reports that since placement of the NG tube, the patient has been unable to talk.  ? ?Assessment & Plan: ?  ?Principal Problem: ?  Femoral hernia of left side with obstruction ?Active Problems: ?  Parkinson disease ([place]) ?  Anxiety ?  Failure to thrive in adult ? ?* Left femoral hernia with strangulation of small bowel ?-Patient presenting with necrotic bowel, incarcerated hernia status post emergent repair 01/17/22 - Management per surgery ?-NG tube in place - tube does not appear to be functioning today (abdomen distended but nontympanic) discussed with surgery for possible need for flushing versus repositioning ?-On TPN for nutrition ?-Discontinue IV morphine, decrease oxycodone burden per family wishes and attempts to wean patient off of narcotics, patient is agreeable. ?  ?Failure to thrive in adult ?-Profound cachexia, chronic in appearance, poor appetite at baseline per family at bedside ?-Likely exacerbated by Parkinson's, advanced age and comorbid conditions ?-PT OT speech ongoing ?  ?Acute vocal cord dysfunction  ?-Minimally improving over the past few days, continue to follow clinically, supportive care ongoing ?-Able to speak in a quiet voice today at bedside for review of systems which is improving since  admission per family ? ?Anxiety, uncontrolled ?Insomnia, uncontrolled ?-Previously failed Valium, BuSpar, Lexapro ?-Refusing Zoloft -now discontinued ?-Continue to offer melatonin nightly scheduled ?-Seroquel low-dose ordered nightly as needed ?-Lengthy discussion with family that given beers criteria patient's age and comorbid she was not a candidate for IV Benadryl or IV benzodiazepines as this will likely only increase her risk for hospital delirium and sundowning.  Patient in agreement to hold his medications ?  ?Parkinson disease ([place]) ?-Baseline Parkinson's ?-Continue home Sinemet ?-Acute vocal cord dysfunction unrelated to chronic Parkinson's diagnosis ? ?DVT prophylaxis: Lovenox ?Code Status: Full ?Family Communication: Daughter at bedside ? ?Status is: Inpatient ? ?Dispo: The patient is from: Home ?             Anticipated d/c is to: To be determined ?             Anticipated d/c date is: 72+ hours ?             Patient currently not medically stable for discharge ? ?Consultants:  ?Internal medicine ? ?Procedures:  ?Left femoral hernia with obstruction and gangrene repair with absorbable mesh small bowel resection and anastomosis ? ?Antimicrobials:  ?None ? ?Subjective: ?Continues to report poor sleep overnight due to anxiety and general discomfort from NG tube and bowel obstruction.  Currently denies any fevers chills chest pain shortness of breath.  Abdominal pain and distention ongoing as above given dysfunctional NG tube this morning. ? ?Objective: ?Vitals:  ? 01/21/22 2133 01/21/22 2359 01/22/22 8127 01/22/22 0754  ?BP: 124/78 115/75 127/80 120/80  ?Pulse: (!) 101 88  96 94  ?Resp: '17 16 17 17  '$ ?Temp: (!) 97.4 ?F (36.3 ?C) (!) 97.4 ?F (36.3 ?C) 98.2 ?F (36.8 ?C) 97.7 ?F (36.5 ?C)  ?TempSrc: Tympanic Tympanic Oral Oral  ?SpO2: 100% 100% 100% 98%  ?Weight:      ?Height:      ? ? ?Intake/Output Summary (Last 24 hours) at 01/22/2022 9242 ?Last data filed at 01/22/2022 6834 ?Gross per 24 hour  ?Intake 2319.98  ml  ?Output 2025 ml  ?Net 294.98 ml  ? ? ?Filed Weights  ? 01/17/22 1751 01/17/22 2331  ?Weight: 53.1 kg 43.4 kg  ? ? ?Examination: ? ?General: Cachectic female resting comfortably in bed, no acute distress. ?HEENT: NG draining thick dark brown/green fluid. ?Lungs:  Clear to auscultate bilaterally without rhonchi, wheeze, or rales. ?Abdomen: Postsurgical bandage clean dry intact, abdomen distended nontympanic but markedly tender diffusely ?Extremities: Without cyanosis, clubbing, edema, or obvious deformity. ? ?Data Reviewed: I have personally reviewed following labs and imaging studies ? ?CBC: ?Recent Labs  ?Lab 01/18/22 ?0112 01/19/22 ?0436 01/20/22 ?0130 01/21/22 ?0505 01/22/22 ?0139  ?WBC 12.5* 10.5 5.2 5.0 6.6  ?HGB 12.4 12.0 11.2* 10.4* 10.1*  ?HCT 38.1 36.2 33.5* 32.1* 30.7*  ?MCV 90.9 90.7 90.3 90.4 90.8  ?PLT 152 141* 123* 148* 153  ? ? ?Basic Metabolic Panel: ?Recent Labs  ?Lab 01/18/22 ?0112 01/19/22 ?0436 01/19/22 ?1962 01/20/22 ?0130 01/21/22 ?0505 01/22/22 ?0139  ?NA 137 135  --  135 137 136  ?K 3.3* 3.5  --  3.4* 3.6 3.9  ?CL 104 101  --  99 100 99  ?CO2 25 25  --  '29 30 30  '$ ?GLUCOSE 119* 117*  --  131* 122* 129*  ?BUN 13 20  --  '20 16 19  '$ ?CREATININE 0.63 0.75  --  0.53 0.48 0.51  ?CALCIUM 9.2 9.3  --  9.1 8.9 9.3  ?MG  --   --  1.9 2.0 1.7 2.0  ?PHOS  --   --  2.7 2.2* 2.2* 3.5  ? ? ?GFR: ?Estimated Creatinine Clearance: 44.2 mL/min (by C-G formula based on SCr of 0.51 mg/dL). ?Liver Function Tests: ?Recent Labs  ?Lab 01/18/22 ?0112 01/19/22 ?0436 01/20/22 ?0130 01/21/22 ?0505 01/22/22 ?0139  ?AST 17 16 13* 14* 12*  ?ALT '7 7 7 10 9  '$ ?ALKPHOS 36* 37* 35* 30* 34*  ?BILITOT 1.2 1.3* 1.4* 1.0 0.8  ?PROT 5.4* 6.0* 5.6* 5.8* 5.8*  ?ALBUMIN 3.3* 3.2* 2.7* 2.6* 2.7*  ? ? ?Recent Labs  ?Lab 01/17/22 ?1416  ?LIPASE <10*  ? ? ?No results for input(s): AMMONIA in the last 168 hours. ?Coagulation Profile: ?No results for input(s): INR, PROTIME in the last 168 hours. ?Cardiac Enzymes: ?No results for input(s):  CKTOTAL, CKMB, CKMBINDEX, TROPONINI in the last 168 hours. ?BNP (last 3 results) ?No results for input(s): PROBNP in the last 8760 hours. ?HbA1C: ?No results for input(s): HGBA1C in the last 72 hours. ? ?CBG: ?Recent Labs  ?Lab 01/21/22 ?0801 01/21/22 ?1243 01/21/22 ?1549 01/22/22 ?0016 01/22/22 ?2297  ?GLUCAP 138* 130* 123* 126* 124*  ? ? ?Lipid Profile: ?Recent Labs  ?  01/20/22 ?0130 01/21/22 ?0505  ?TRIG 46 46  ? ? ?Thyroid Function Tests: ?No results for input(s): TSH, T4TOTAL, FREET4, T3FREE, THYROIDAB in the last 72 hours. ?Anemia Panel: ?No results for input(s): VITAMINB12, FOLATE, FERRITIN, TIBC, IRON, RETICCTPCT in the last 72 hours. ?Sepsis Labs: ?No results for input(s): PROCALCITON, LATICACIDVEN in the last 168 hours. ? ?Recent Results (from the past 240  hour(s))  ?Resp Panel by RT-PCR (Flu A&B, Covid) Nasopharyngeal Swab     Status: None  ? Collection Time: 01/17/22  6:11 PM  ? Specimen: Nasopharyngeal Swab; Nasopharyngeal(NP) swabs in vial transport medium  ?Result Value Ref Range Status  ? SARS Coronavirus 2 by RT PCR NEGATIVE NEGATIVE Final  ?  Comment: (NOTE) ?SARS-CoV-2 target nucleic acids are NOT DETECTED. ? ?The SARS-CoV-2 RNA is generally detectable in upper respiratory ?specimens during the acute phase of infection. The lowest ?concentration of SARS-CoV-2 viral copies this assay can detect is ?138 copies/mL. A negative result does not preclude SARS-Cov-2 ?infection and should not be used as the sole basis for treatment or ?other patient management decisions. A negative result may occur with  ?improper specimen collection/handling, submission of specimen other ?than nasopharyngeal swab, presence of viral mutation(s) within the ?areas targeted by this assay, and inadequate number of viral ?copies(<138 copies/mL). A negative result must be combined with ?clinical observations, patient history, and epidemiological ?information. The expected result is Negative. ? ?Fact Sheet for Patients:   ?EntrepreneurPulse.com.au ? ?Fact Sheet for Healthcare Providers:  ?IncredibleEmployment.be ? ?This test is no t yet approved or cleared by the [place] FDA and  ?has bee

## 2022-01-22 NOTE — Progress Notes (Signed)
PHARMACY - TOTAL PARENTERAL NUTRITION CONSULT NOTE  ? ?Indication: Prolonged ileus ? ?Patient Measurements: ?Height: '5\' 7"'$  (170.2 cm) ?Weight: 43.4 kg (95 lb 10.9 oz) ?IBW/kg (Calculated) : 61.6 ?TPN AdjBW (KG): 43.4 ?Body mass index is 14.99 kg/m?. ?Usual Weight: ~115#  ? ?Assessment:  ?71 y.o. F s/p femoral hernia repair with SBR 2/2 gangrene on 3/23. Pt with NGT to suction. Noted poor po intake for about 6 months due to living situation. Daughter reports pt UBW ~115# with wt loss of 20-30# within past few months. No flatus or BM. Pharmacy consulted to start TPN for ileus in pt with severe PCM upon admission. ? ?Glucose / Insulin: BGs < 140 - requiring no insulin. Electrolytes: coCa 10.3, others wnl. Pt is at high risk of refeeding due to poor po intake PTA. ?Renal: SCr <1 ?Hepatic: alb 2.7, LFTs/T.bili wnl , TG 46 ?Intake / Output; MIVF: UOP 1913m ?NGT o/p = 1000 ml ?Current MIVF: D51/2NS at 10 ml/hr ?GI Imaging: ?3/23 Incarcerated left inguinal small-bowel hernia with high-grade small ?bowel obstruction. ?GI Surgeries / Procedures:  ?3/23 femoral hernia repair with SBR ? ?Central access: PICC placed 3/25  ?TPN start date: 01/19/22 ? ?Nutritional Goals: ?Goal TPN rate is 60 mL/hr (provides 72 g of protein and 1382 kcals per day) ? ?RD Assessment: ?Estimated Needs ?Total Energy Estimated Needs: 1300-1500 ?Total Protein Estimated Needs: 65-80 grams ?Total Fluid Estimated Needs: >/= 1.5 L ? ?Current Nutrition:  ?NPO ?TPN at 621mhr - providing 72 gm protein and 1382 kcal ? ?Plan:  ?Continue TPN at goal rate of 60 mL/hr at 1800 ?Electrolytes in TPN: Decrease Phos to 22 mmol/L. Continue Na 100 mEq/L, K 70 mEq/L, Mag 8 mEq/L, Ca 13m40mL, and Cl:Ac 1:1 ?Continue standard MVI and trace elements to TPN ?Continue thiamine '100mg'$  daily x 5 days (3/25>>3/29) ?Discontinue very Sensitive q6h SSI and cBG q6h as pt has not required any insulin while on goal rate of TPN ?Continue MIVF to 10 mL/hr at 1800 ?Monitor TPN labs daily  until stable then on Mon/Thurs ? ?CarLuisa HartharmD, BCPS ?Clinical Pharmacist ?01/22/2022 8:23 AM  ? ?Please refer to [facility] phone number ? ? ?

## 2022-01-22 NOTE — Progress Notes (Signed)
5 Days Post-Op  ? ?Subjective/Chief Complaint: ?No family at the bedside this morning.  Patient wrote to me that she is feeling a bit better.  Repots small amt flatus and 2 BMs yesterday after suppository. Does endorse increased distention compared to last night but no nausea. Working with OT now. ? ?Objective: ?Vital signs in last 24 hours: ?Temp:  [97.4 ?F (36.3 ?C)-98.8 ?F (37.1 ?C)] 97.7 ?F (36.5 ?C) (03/28 0754) ?Pulse Rate:  [88-107] 94 (03/28 0754) ?Resp:  [16-20] 17 (03/28 0754) ?BP: (115-133)/(75-87) 120/80 (03/28 0754) ?SpO2:  [98 %-100 %] 98 % (03/28 0754) ?Last BM Date :  (pta) ? ?Intake/Output from previous day: ?03/27 0701 - 03/28 0700 ?In: 2320 [I.V.:2320] ?Out: 2025 [Urine:125; Emesis/NG output:1000] ?Intake/Output this shift: ?No intake/output data recorded. ? ?PE: ?Gen: Frail appearing female in NAD ?Abd - mildly distended, hypoactive BS, appropriately tender over L femoral hernia incisions wich is c/d/i, NGT with clear green bilious output that does have some sediment. (1,000cc/24h) ?Left inguinal incision - c/d/i ? ?Lab Results:  ?Recent Labs  ?  01/21/22 ?0505 01/22/22 ?0139  ?WBC 5.0 6.6  ?HGB 10.4* 10.1*  ?HCT 32.1* 30.7*  ?PLT 148* 153  ? ?BMET ?Recent Labs  ?  01/21/22 ?0505 01/22/22 ?0139  ?NA 137 136  ?K 3.6 3.9  ?CL 100 99  ?CO2 30 30  ?GLUCOSE 122* 129*  ?BUN 16 19  ?CREATININE 0.48 0.51  ?CALCIUM 8.9 9.3  ? ?PT/INR ?No results for input(s): LABPROT, INR in the last 72 hours. ?ABG ?No results for input(s): PHART, HCO3 in the last 72 hours. ? ?Invalid input(s): PCO2, PO2 ? ?Studies/Results: ?DG Abd Portable 1V ? ?Result Date: 01/21/2022 ?CLINICAL DATA:  Ileus. History of incarcerated left femoral hernia and status post hernia repair and small bowel resection. EXAM: PORTABLE ABDOMEN - 1 VIEW COMPARISON:  CT abdomen and pelvis 01/17/2022 FINDINGS: Single view of the abdomen demonstrates a feeding tube in the left abdomen which is likely in the stomach body region. Gas-filled loops of small  bowel throughout the abdomen and pelvis. Pelvic calcifications compatible with known fibroid uterus. Limited evaluation for free air on this portable supine view. IMPRESSION: Diffusely dilated loops of small bowel throughout the abdomen and pelvis. Findings are suggestive for a postoperative ileus. Feeding tube in the gastric body region. Electronically Signed   By: Markus Daft M.D.   On: 01/21/2022 13:12   ? ?Anti-infectives: ?Anti-infectives (From admission, onward)  ? ? None  ? ?  ? ? ?Assessment/Plan: ?POD 4,S/p emergent left femoral hernia repair with Phasix mesh/ small bowel resection 01/17/22 - Dr. Kieth Brightly for incarcerated left femoral hernia  ?-Awaiting further return of bowel function; continue NG to LIWS ?-Protein-calorie malnutrition - severe, on TPN ?-PT/OT consults ?-continue NGT, KUB yeterday consistent with ileus. ? ?FEN: NPO, IVF, NG/TPN ?ID: None ?VTE: SCDs, Lovenox ?Foley: None ?Dispo: Continued care on floor, awaiting return of bowel function ? ?Parkinson's disease - per medicine ?Anxiety  ?FTT in adult ? ? LOS: 5 days  ? ? ?[place] ?01/22/2022 ? ?

## 2022-01-22 NOTE — Progress Notes (Signed)
Occupational Therapy Treatment ?Patient Details ?Name: [name] [name] ?MRN: [id number] ?DOB: [birth year] ?Today's Date: 01/22/2022 ? ? ?History of present illness 71 yo female with 1 day of worsening abdominal pain and vomiting, s/p femoral hernia repair with SBR 2/2 gangrene on 3/23. Pt with NGT to suction. Noted poor po intake for about 6 months due to living situation. Daughter reports pt  with wt loss of 20-30# within past few months. Significant PMH: Parkinson disease, anxiety ?  ?OT comments ? Pt was able to complete 2 hygiene tasks with set up followed by UE bathing with set up to min assist and LE bathing with max assist. Pt currently with functional limitations due to the deficits listed below (see OT Problem List).  Pt will benefit from skilled OT to increase their safety and independence with ADL and functional mobility for ADL to facilitate discharge to venue listed below.  ?  ? ?Recommendations for follow up therapy are one component of a multi-disciplinary discharge planning process, led by the attending physician.  Recommendations may be updated based on patient status, additional functional criteria and insurance authorization. ?   ?Follow Up Recommendations ? Skilled nursing-short term rehab (<3 hours/day)  ?  ?Assistance Recommended at Discharge Frequent or constant Supervision/Assistance  ?Patient can return home with the following ? A lot of help with bathing/dressing/bathroom;A lot of help with walking and/or transfers ?  ?Equipment Recommendations ? Other (comment)  ?  ?Recommendations for Other Services   ? ?  ?Precautions / Restrictions Precautions ?Precautions: Fall ?Precaution Comments: watch HR, NGT ?Restrictions ?Weight Bearing Restrictions: No  ? ? ?  ? ?Mobility Bed Mobility ?  ?  ?  ?  ?  ?  ?  ?  ?  ? ?Transfers ?  ?  ?  ?  ?  ?  ?  ?  ?  ?  ?  ?  ?Balance   ?  ?  ?  ?  ?  ?  ?  ?  ?  ?  ?  ?  ?  ?  ?  ?  ?  ?  ?   ? ?ADL either performed or assessed with clinical judgement  ? ?ADL  Overall ADL's : Needs assistance/impaired ?Eating/Feeding: NPO ?  ?Grooming: Wash/dry face;Oral care;Set up;Sitting;Bed level ?  ?Upper Body Bathing: Minimal assistance;Sitting;Bed level ?  ?Lower Body Bathing: Maximal assistance ?  ?Upper Body Dressing : Minimal assistance;Sitting;Bed level ?  ?Lower Body Dressing: Maximal assistance;Bed level ?  ?  ?  ?Toileting- Clothing Manipulation and Hygiene: Maximal assistance ?Toileting - Clothing Manipulation Details (indicate cue type and reason): anterior portion post set up at bed level ?  ?  ?  ?  ?  ? ?Extremity/Trunk Assessment Upper Extremity Assessment ?Upper Extremity Assessment: Generalized weakness ?  ?Lower Extremity Assessment ?Lower Extremity Assessment: Defer to PT evaluation ?  ?  ?  ? ?Vision   ?  ?  ?Perception   ?  ?Praxis   ?  ? ?Cognition Arousal/Alertness: Awake/alert ?Behavior During Therapy: [facility] for tasks assessed/performed ?Overall Cognitive Status: Within Functional Limits for tasks assessed ?  ?  ?  ?  ?  ?  ?  ?  ?  ?  ?  ?  ?  ?  ?  ?  ?  ?  ?  ?   ?Exercises   ? ?  ?Shoulder Instructions   ? ? ?  ?General Comments    ? ? ?Pertinent Vitals/ Pain  Pain Assessment ?Pain Assessment: Faces ?Faces Pain Scale: Hurts a little bit ?Pain Location: pt reported sore because of the bed ?Pain Descriptors / Indicators: Aching, Sore ?Pain Intervention(s): Limited activity within patient's tolerance, Repositioned ? ?Home Living   ?  ?  ?  ?  ?  ?  ?  ?  ?  ?  ?  ?  ?  ?  ?  ?  ?  ?  ? ?  ?Prior Functioning/Environment    ?  ?  ?  ?   ? ?Frequency ? Min 2X/week  ? ? ? ? ?  ?Progress Toward Goals ? ?OT Goals(current goals can now be found in the care plan section) ? Progress towards OT goals: Progressing toward goals ? ?Acute Rehab OT Goals ?Patient Stated Goal: to be comfortable and stronger ?OT Goal Formulation: With patient/family ?Time For Goal Achievement: 02/02/22 ?Potential to Achieve Goals: Good ?ADL Goals ?Pt Will Perform Grooming: with min guard  assist;sitting ?Pt Will Perform Upper Body Bathing: with min assist;with min guard assist;sitting ?Pt Will Perform Lower Body Bathing: with mod assist;sitting/lateral leans ?Pt Will Perform Upper Body Dressing: with min assist;with min guard assist;sitting ?Pt Will Transfer to Toilet: with min assist;stand pivot transfer;bedside commode ?Pt Will Perform Toileting - Clothing Manipulation and hygiene: with max assist;with mod assist;sit to/from stand  ?Plan Discharge plan remains appropriate   ? ?Co-evaluation ? ? ?   ?  ?  ?  ?  ? ?  ?AM-PAC OT "6 Clicks" Daily Activity     ?Outcome Measure ? ? Help from another person eating meals?: Total (NPO) ?Help from another person taking care of personal grooming?: A Little ?Help from another person toileting, which includes using toliet, bedpan, or urinal?: Total ?Help from another person bathing (including washing, rinsing, drying)?: A Lot ?Help from another person to put on and taking off regular upper body clothing?: A Little ?Help from another person to put on and taking off regular lower body clothing?: A Lot ?6 Click Score: 12 ? ?  ?End of Session   ? ?OT Visit Diagnosis: Unsteadiness on feet (R26.81);Muscle weakness (generalized) (M62.81);Other abnormalities of gait and mobility (R26.89) ?  ?Activity Tolerance Patient limited by fatigue ?  ?Patient Left with call bell/phone within reach;with bed alarm set;in bed ?  ?Nurse Communication   ?  ? ?   ? ?Time: 3785-8850 ?OT Time Calculation (min): 52 min ? ?Charges: OT General Charges ?$OT Visit: 1 Visit ?OT Treatments ?$Self Care/Home Management : 38-52 mins ? ?Joeseph Amor OTR/L  ?Acute Rehab Services  ?[phone] office number ?[phone] pager number ? ? ?Joeseph Amor ?01/22/2022, 8:40 AM ?

## 2022-01-22 NOTE — TOC Progression Note (Addendum)
Transition of Care (TOC) - Progression Note  ? ? ?Patient Details  ?Name: [name] [name] ?MRN: [id number] ?Date of Birth: October 28, 1951 ? ?Transition of Care (TOC) CM/SW Contact  ?Emeterio Reeve, LCSW ?Phone Number: ?01/22/2022, 12:41 PM ? ?Clinical Narrative:    ? ?CSW spoke with pts daughters, one in person the other on the phone. CSW gave bed offers. CSW will check back for bed choice. Daughter requested CSW to call [facility] for [facility]. [facility] stated they can accept pt as long as she is off TPN at [facility]. CSW called daughter on phone and left message.  ? ? ? ?Expected Discharge Plan: [place] ?Barriers to Discharge: Continued Medical Work up, SNF Pending bed offer ? ?Expected Discharge Plan and Services ?Expected Discharge Plan: [place] ?In-house Referral: Clinical Social Work ?  ?Post Acute Care Choice: [place] ?Living arrangements for the past 2 months: [place] ?                ?  ?  ?  ?  ?  ?  ?  ?  ?  ?  ? ? ?Social Determinants of Health (SDOH) Interventions ?  ? ?Readmission Risk Interventions ?   ? View : No data to display.  ?  ?  ?  ? ?Emeterio Reeve, LCSW ?Clinical Social Worker ? ?

## 2022-01-22 NOTE — Progress Notes (Signed)
Speech Language Pathology Treatment: Cognitive-Linquistic  ?Patient Details ?Name: [name] [name] ?MRN: [id number] ?DOB: 06/04/1951 ?Today's Date: 01/22/2022 ?Time: 1350-1414 ?SLP Time Calculation (min) (ACUTE ONLY): 24 min ? ?Assessment / Plan / Recommendation ?Clinical Impression ? Pt continues to present with hypophonia, texting a lot of her communication but trying to vocalize some. She says she has been using her communication board and finds it helpful, so SLP assisted in adding some additional items to further personalize it. Unfortunately SLP was not able to find a voice amplifier so far, but pt also seems to be able to phonate a little louder today. SLP provided visual feedback with use of decibel meter so that pt could try to self-regulate her volume. She was able to hit 80 dB but not sustain it there, starting with phoneme /a/ and then moving to word level production. Pt says that her speech sounds similar to how it typically does when she can increase her volume, but she also believes that she is impacted by her dry mouth and limited dentition as her dentures are at home. Attempted to help pt get a decibel meter onto her phone but she was not sure how to get apps. She agreed that her daughter might be able to help her when she returned. Could also provide different visual cues without exact decibel monitoring if she does not want to (or can't) download one of those free programs. Will continue to follow acutely. ? ?  ?HPI HPI: 71 yo female with 1 day of worsening abdominal pain and vomiting, s/p femoral hernia repair with SBR 2/2 gangrene on 3/23. Pt with NGT to suction. Noted poor po intake for about 6 months due to living situation. Daughter reports pt with wt loss of 20-30# within past few months. SLP ordered as pt became acutely nonverbal this admission. Significant PMH: Parkinson's disease (prior course of OP SLP in 2018 for dysarthria/voice, d/c after all goals were met), anxiety ?  ?   ?SLP Plan ?  Continue with current plan of care ? ?  ?  ?Recommendations for follow up therapy are one component of a multi-disciplinary discharge planning process, led by the attending physician.  Recommendations may be updated based on patient status, additional functional criteria and insurance authorization. ?  ? ?Recommendations  ?   ?   ?    ?   ? ? ? ? Follow Up Recommendations: Skilled nursing-short term rehab (<3 hours/day) ?Assistance recommended at discharge: Intermittent Supervision/Assistance ?SLP Visit Diagnosis: Dysarthria and anarthria (R47.1) ?Plan: Continue with current plan of care ? ? ? ? ?  ?  ? ? ? N., M.A. CCC-SLP ?Acute Rehabilitation Services ?Pager [phone] ?Office [phone] ? ? ?01/22/2022, 2:18 PM ?

## 2022-01-22 NOTE — Progress Notes (Signed)
? ?  Palliative Medicine Progress Note ? ?HPI/Patient Profile: 71 y.o. female  with past medical history of Parkinson's, anxiety admitted on 01/17/2022 with abdominal pain, vomiting, constipation. ?  ?Patient was admitted for obstructing femoral hernia now with post-op ileus. PMT has been consulted to assist with goals of care conversation. ? ?Subjective: ?Medical records reviewed. Patient assessed at the bedside and this PA assisted NT with transfer to bedside chair. Patient's daughter is present at the bedside and shares concern regarding patient's pills being crushed and causing choking sensation. Patient continues to experience severe anxiety and has difficulty trusting staff. She is in good spirits during my visit, smiling and joking. ? ?Created space and opportunity for patient and family's thoughts and feelings on her current illness. Patient clearly states "I want to live" and her daughter [name] tells me separately that she understands it may very well be time for hospice soon. However, she supports her mother's wishes at this time and hopes to alleviate as much suffering as possible while prolonging her life as long as possible. She has reviewed the MOST form and patient shares she would like to rest, revisit this another time. ? ?The difference between aggressive medical intervention and comfort care was considered in light of the patient's goals of care. Hospice and Palliative Care services outpatient were explained and offered.  ? ? ?Physical Exam: ?General: frail, elderly AA F ?HEENT: NTG in place ?Cardiac: tachycardic ?Respiratory: normal respiratory effort ?Neuro: A&Ox3 ?Skin: warm and dry ?Psych: anxious ? ?Assessment: ?Parkinson's ?Anxiety  ?Incarcerated femoral hernia s/p hernia repair ?Ileus, improving ?Goals of care conversation ? ?Plan: ?-Continue current care ?-Goal is SNF with outpatient palliative care referral, patient is not ready for hospice or end of life at this time ?-Patient  defers MOST form completion today, will follow up ?-Psychosocial and emotional support provided ?-PMT will continue to support ? ? ? ?Total time: ?I spent 60 minutes in the care of the patient today in the above activities and documenting the encounter. ? ? ?Dorthy Cooler, PA-C ?Palliative Medicine Team ?Team phone # [phone] ? ?Thank you for allowing the Palliative Medicine Team to assist in the care of this patient. Please utilize secure chat with additional questions, if there is no response within 30 minutes please call the above phone number. ? ?Palliative Medicine Team providers are available by phone from 7am to 7pm daily and can be reached through the team cell phone.  ?Should this patient require assistance outside of these hours, please call the patient's attending physician.  ?

## 2022-01-23 DIAGNOSIS — F419 Anxiety disorder, unspecified: Secondary | ICD-10-CM | POA: Diagnosis not present

## 2022-01-23 DIAGNOSIS — Z7189 Other specified counseling: Secondary | ICD-10-CM | POA: Diagnosis not present

## 2022-01-23 DIAGNOSIS — L89151 Pressure ulcer of sacral region, stage 1: Secondary | ICD-10-CM | POA: Diagnosis present

## 2022-01-23 DIAGNOSIS — K4130 Unilateral femoral hernia, with obstruction, without gangrene, not specified as recurrent: Secondary | ICD-10-CM | POA: Diagnosis not present

## 2022-01-23 DIAGNOSIS — R627 Adult failure to thrive: Secondary | ICD-10-CM | POA: Diagnosis not present

## 2022-01-23 MED ORDER — TRAVASOL 10 % IV SOLN
INTRAVENOUS | Status: AC
  Filled 2022-01-23: qty 924

## 2022-01-23 MED ORDER — TRAVASOL 10 % IV SOLN: INTRAVENOUS | Status: DC

## 2022-01-23 MED ORDER — PHENOL 1.4 % MT LIQD
1.0000 | OROMUCOSAL | Status: DC | PRN
  Administered 2022-02-03: 1 via OROMUCOSAL
  Filled 2022-01-23: qty 177

## 2022-01-23 NOTE — Progress Notes (Signed)
?Progress Note ? ?Patient: [name] [name] YKD:[id number] DOB: 11/21/1950  ?DOA: 01/17/2022  DOS: 01/23/2022  ?  ?Brief hospital course: ?THERSEA MANFREDONIA is a 71 y.o. female with past medical history of Parkinson's disease and anxiety who presented on 3/23 with abdominal pain, admitted for incarcerated left femoral hernia requiring open repair and resection of necrotic small bowel 3/23. Postoperative ileus is being treated supportively by primary team with hospitalists consulted for medical management.  ? ?Assessment and Plan: ?* Femoral hernia of left side with obstruction ?s/p resection 01/17/2022.  ?- Management per surgery ? ?Pressure injury of coccygeal region, stage 1 ?POA, offload and optimize nutritional status as much as possible.  ? ?Failure to thrive in adult ?- Severely malnourished baseline clearly predates her acute illness. BMI is < 15.  ?- Palliative is consulted, currently plan is for aggressive measures. Goal is to "get well" back to PLOF. Will pursue SNF placement (if able to liberate from NGT and TPN).  ?- Treating deconditioning with therapy and malnutrition with TPN as above. ? ?Anxiety ?- Has been on buspar '5mg'$  BID, valium '2mg'$  (though this was prn vertigo).  ?- Currently has to be NPO, if this is anticipated to be a prolonged need, may bee psychiatry recommendation Re: depots. ? ?Parkinson disease ([place]) ?No alternative to sinemet, must attempt to administer enterally. No bradykinesia to suggest acute issue. ? ?Subjective: Feeling overall much better, optimistic to get better. Voice is stronger per pt and family. No dyspnea. Throat pain is moderate, requests throat spray. Still significant NG output. No N/V. Abd pain controlled. ? ?Objective: ?Vitals:  ? 01/22/22 2020 01/23/22 0413 01/23/22 0723 01/23/22 1606  ?BP: 123/77 125/77 131/80 128/81  ?Pulse: (!) 103 (!) 102 (!) 109 100  ?Resp: '16 16 16 17  '$ ?Temp: 97.6 ?F (36.4 ?C) (!) 97.3 ?F (36.3 ?C) 98.2 ?F (36.8 ?C) 99.1 ?F (37.3 ?C)  ?TempSrc: Oral  Oral Oral Oral  ?SpO2: 100% 100% 100% 100%  ?Weight:      ?Height:      ? ?Gen: Cachectic 71 y.o. female in no distress ?Pulm: Nonlabored breathing, clear. ?CV: Regular rate and rhythm. No murmur, rub, or gallop. No JVD, no dependent edema. ?GI: Abdomen soft, non-tender, non-distended, with normoactive bowel sounds.  ?Ext: Warm, wasted without deformities ?Skin: No rashes, lesions or ulcers on visualized skin. ?Neuro: Alert and oriented. No focal neurological deficits. ?Psych: Judgement and insight appear fair. Mood euthymic & affect congruent. Behavior is appropriate.   ? ?Data Personally reviewed: ? ?CBC: ?Recent Labs  ?Lab 01/18/22 ?0112 01/19/22 ?0436 01/20/22 ?0130 01/21/22 ?0505 01/22/22 ?0139  ?WBC 12.5* 10.5 5.2 5.0 6.6  ?HGB 12.4 12.0 11.2* 10.4* 10.1*  ?HCT 38.1 36.2 33.5* 32.1* 30.7*  ?MCV 90.9 90.7 90.3 90.4 90.8  ?PLT 152 141* 123* 148* 153  ? ?Basic Metabolic Panel: ?Recent Labs  ?Lab 01/18/22 ?0112 01/19/22 ?0436 01/19/22 ?3976 01/20/22 ?0130 01/21/22 ?0505 01/22/22 ?0139  ?NA 137 135  --  135 137 136  ?K 3.3* 3.5  --  3.4* 3.6 3.9  ?CL 104 101  --  99 100 99  ?CO2 25 25  --  '29 30 30  '$ ?GLUCOSE 119* 117*  --  131* 122* 129*  ?BUN 13 20  --  '20 16 19  '$ ?CREATININE 0.63 0.75  --  0.53 0.48 0.51  ?CALCIUM 9.2 9.3  --  9.1 8.9 9.3  ?MG  --   --  1.9 2.0 1.7 2.0  ?PHOS  --   --  2.7 2.2* 2.2* 3.5  ? ?GFR: ?Estimated Creatinine Clearance: 44.2 mL/min (by C-G formula based on SCr of 0.51 mg/dL). ?Liver Function Tests: ?Recent Labs  ?Lab 01/18/22 ?0112 01/19/22 ?0436 01/20/22 ?0130 01/21/22 ?0505 01/22/22 ?0139  ?AST 17 16 13* 14* 12*  ?ALT '7 7 7 10 9  '$ ?ALKPHOS 36* 37* 35* 30* 34*  ?BILITOT 1.2 1.3* 1.4* 1.0 0.8  ?PROT 5.4* 6.0* 5.6* 5.8* 5.8*  ?ALBUMIN 3.3* 3.2* 2.7* 2.6* 2.7*  ? ?Recent Labs  ?Lab 01/17/22 ?1416  ?LIPASE <10*  ? ?No results for input(s): AMMONIA in the last 168 hours. ?Coagulation Profile: ?No results for input(s): INR, PROTIME in the last 168 hours. ?Cardiac Enzymes: ?No results for  input(s): CKTOTAL, CKMB, CKMBINDEX, TROPONINI in the last 168 hours. ?BNP (last 3 results) ?No results for input(s): PROBNP in the last 8760 hours. ?HbA1C: ?No results for input(s): HGBA1C in the last 72 hours. ?CBG: ?Recent Labs  ?Lab 01/21/22 ?1549 01/22/22 ?0016 01/22/22 ?1610 01/22/22 ?1216 01/22/22 ?1713  ?GLUCAP 123* 126* 124* 128* 130*  ? ?Lipid Profile: ?Recent Labs  ?  01/21/22 ?0505  ?TRIG 46  ? ?Thyroid Function Tests: ?No results for input(s): TSH, T4TOTAL, FREET4, T3FREE, THYROIDAB in the last 72 hours. ?Anemia Panel: ?No results for input(s): VITAMINB12, FOLATE, FERRITIN, TIBC, IRON, RETICCTPCT in the last 72 hours. ?Urine analysis: ?   ?Component Value Date/Time  ? COLORURINE YELLOW 01/18/2022 1125  ? APPEARANCEUR CLEAR 01/18/2022 1125  ? LABSPEC >1.046 (H) 01/18/2022 1125  ? PHURINE 5.0 01/18/2022 1125  ? GLUCOSEU NEGATIVE 01/18/2022 1125  ? HGBUR NEGATIVE 01/18/2022 1125  ? [place] NEGATIVE 01/18/2022 1125  ? KETONESUR 20 (A) 01/18/2022 1125  ? PROTEINUR 30 (A) 01/18/2022 1125  ? UROBILINOGEN 0.2 10/02/2012 1223  ? NITRITE NEGATIVE 01/18/2022 1125  ? LEUKOCYTESUR NEGATIVE 01/18/2022 1125  ? ?Recent Results (from the past 240 hour(s))  ?Resp Panel by RT-PCR (Flu A&B, Covid) Nasopharyngeal Swab     Status: None  ? Collection Time: 01/17/22  6:11 PM  ? Specimen: Nasopharyngeal Swab; Nasopharyngeal(NP) swabs in vial transport medium  ?Result Value Ref Range Status  ? SARS Coronavirus 2 by RT PCR NEGATIVE NEGATIVE Final  ?  Comment: (NOTE) ?SARS-CoV-2 target nucleic acids are NOT DETECTED. ? ?The SARS-CoV-2 RNA is generally detectable in upper respiratory ?specimens during the acute phase of infection. The lowest ?concentration of SARS-CoV-2 viral copies this assay can detect is ?138 copies/mL. A negative result does not preclude SARS-Cov-2 ?infection and should not be used as the sole basis for treatment or ?other patient management decisions. A negative result may occur with  ?improper specimen  collection/handling, submission of specimen other ?than nasopharyngeal swab, presence of viral mutation(s) within the ?areas targeted by this assay, and inadequate number of viral ?copies(<138 copies/mL). A negative result must be combined with ?clinical observations, patient history, and epidemiological ?information. The expected result is Negative. ? ?Fact Sheet for Patients:  ?EntrepreneurPulse.com.au ? ?Fact Sheet for Healthcare Providers:  ?IncredibleEmployment.be ? ?This test is no t yet approved or cleared by the [place] FDA and  ?has been authorized for detection and/or diagnosis of SARS-CoV-2 by ?FDA under an Emergency Use Authorization (EUA). This EUA will remain  ?in effect (meaning this test can be used) for the duration of the ?COVID-19 declaration under Section 564(b)(1) of the Act, 21 ?U.S.C.section 360bbb-3(b)(1), unless the authorization is terminated  ?or revoked sooner.  ? ? ?  ? Influenza A by PCR NEGATIVE NEGATIVE Final  ? Influenza B by  PCR NEGATIVE NEGATIVE Final  ?  Comment: (NOTE) ?The Xpert Xpress SARS-CoV-2/FLU/RSV plus assay is intended as an aid ?in the diagnosis of influenza from Nasopharyngeal swab specimens and ?should not be used as a sole basis for treatment. Nasal washings and ?aspirates are unacceptable for Xpert Xpress SARS-CoV-2/FLU/RSV ?testing. ? ?Fact Sheet for Patients: ?EntrepreneurPulse.com.au ? ?Fact Sheet for Healthcare Providers: ?IncredibleEmployment.be ? ?This test is not yet approved or cleared by the [place] FDA and ?has been authorized for detection and/or diagnosis of SARS-CoV-2 by ?FDA under an Emergency Use Authorization (EUA). This EUA will remain ?in effect (meaning this test can be used) for the duration of the ?COVID-19 declaration under Section 564(b)(1) of the Act, 21 U.S.C. ?section 360bbb-3(b)(1), unless the authorization is terminated or ?revoked. ? ?Performed at [facility], [address] ?[place],  [address] ?  ?Surgical PCR screen     Status: None  ? Collection Time: 01/18/22 11:25 AM  ? Specimen: Nasal Mucosa; Nasal Swab  ?Result Value Ref Range

## 2022-01-23 NOTE — Progress Notes (Signed)
Per Dr. Kae Heller may leave NGT out for now, will monitor patient, keep NPO with sips ?

## 2022-01-23 NOTE — Progress Notes (Addendum)
PHARMACY - TOTAL PARENTERAL NUTRITION CONSULT NOTE  ? ?Indication: Prolonged ileus ? ?Patient Measurements: ?Height: '5\' 7"'$  (170.2 cm) ?Weight: 43.4 kg (95 lb 10.9 oz) ?IBW/kg (Calculated) : 61.6 ?TPN AdjBW (KG): 43.4 ?Body mass index is 14.99 kg/m?. ?Usual Weight: ~115#  ? ?Assessment:  ?71 y.o. F s/p femoral hernia repair with SBR 2/2 gangrene on 3/23. Pt with NGT to suction. Noted poor po intake for about 6 months due to living situation. Daughter reports pt UBW ~115 lbs with wt loss of 20-30 lbs within past few months. No flatus or BM. Pharmacy consulted to start TPN for ileus in pt with severe PCM upon admission. ? ?Glucose / Insulin: BGs < 140 - requiring no insulin.  ?Electrolytes: coCa 10.3, others wnl on 3/28.  ?Renal: SCr <1 ?Hepatic: alb 2.7, LFTs/T.bili wnl , TG 46 ?Intake / Output; MIVF: UOP unmeasured; small amount of flatus and stool charted 3/27 but very small and liquid only ?NGT o/p = 700 ml (thick, bilious) ?Current MIVF: D51/2NS at 10 ml/hr ?GI Imaging: ?3/23 Incarcerated left inguinal small-bowel hernia with high-grade small ?bowel obstruction. ?GI Surgeries / Procedures:  ?3/23 femoral hernia repair with SBR ? ?Central access: PICC placed 3/25  ?TPN start date: 01/19/22 ? ?Nutritional Goals: ?Goal TPN rate is 70 mL/hr (provides 92 g of protein and 1644 kcals per day) ? ?RD Assessment: ?Estimated Needs ?Total Energy Estimated Needs: 1600 - 1800 ?Total Protein Estimated Needs: 80 - 95 grams ?Total Fluid Estimated Needs: >/= 1.6 L ? ?Current Nutrition:  ?NPO ?TPN at 70m/hr - providing 72 gm protein and 1382 kcal ? ?Plan:  ?Increase TPN to new goal rate of 70 mL/hr at 1800 - Adjusted for increased Protein and Kcal goals. ?Electrolytes in TPN:  Adjusted with rate change- Na 86 mEq/L, K 60 mEq/L, Mag 7 mEq/L,  Phos to 20 mmol/L, Ca 4.5 mEq/L, and Cl:Ac 1:1 ?Continue standard MVI and trace elements to TPN ?Continue thiamine '100mg'$  daily x 5 days (3/25>>3/29) ?Monitor TPN labs daily until stable then on  Mon/Thurs ? ?JSloan Leiter PharmD ?Clinical Pharmacist ?Please refer to AThe [facility]for MParachutenumbers ?01/23/2022 8:26 AM  ? ?Please refer to [facility] Centerfor pharmacy phone number ? ? ?

## 2022-01-23 NOTE — Progress Notes (Signed)
? ?  Palliative Medicine Progress Note ? ?HPI/Patient Profile: 71 y.o. female  with past medical history of Parkinson's, anxiety admitted on 01/17/2022 with abdominal pain, vomiting, constipation. ?  ?Patient was admitted for obstructing femoral hernia now with post-op ileus. PMT has been consulted to assist with goals of care conversation. ? ?Subjective: ?Medical records reviewed. Patient assessed at the bedside and resting comfortably. Patient's daughter is present at the bedside. ? ?Patient's daughter shares she has decided on [facility] as her uncle was treated very well there. Remains interested in continued palliative support after discharge. She tells me things are going well in terms of managing patient's anxiety and encouraging trust in staff.  ? ?We discussed Melvinie's concern that patient is reporting more adverse affects after carbidopa/levedopa doses. She feels "yucky" and generally unwell. We discussed possible administration of Zofran. She would also like to consider decreasing dosage or frequency of the medication.  ? ?Questions and concerns addressed. PMT will continue to support holistically. ? ?Physical Exam: ?General: frail, elderly AA F ?HEENT: NTG in place ?Cardiac: tachycardic ?Respiratory: normal respiratory effort ?Neuro: A&Ox3 ?Skin: warm and dry ?Psych: anxious ? ?Assessment: ?Parkinson's ?Anxiety  ?Incarcerated femoral hernia s/p hernia repair ?Ileus, improving ?Goals of care conversation ? ?Plan: ?-Continue current care ?-Patient may benefit from Zofran when taking carbidopa/levodopa  ?-Patient and family would like to go to [facility] with palliative care referral  ?-PMT will support incrementally, goals are clear at this time. Patient and family are aware I am back on service Saturday 4/1 if still admitted ? ?Total time: ?I spent 35 minutes in the care of the patient today in the above activities and documenting the encounter. ? ? ?Dorthy Cooler, PA-C ?Palliative  Medicine Team ?Team phone # [phone] ? ?Thank you for allowing the Palliative Medicine Team to assist in the care of this patient. Please utilize secure chat with additional questions, if there is no response within 30 minutes please call the above phone number. ? ?Palliative Medicine Team providers are available by phone from 7am to 7pm daily and can be reached through the team cell phone.  ?Should this patient require assistance outside of these hours, please call the patient's attending physician.  ?

## 2022-01-23 NOTE — Progress Notes (Signed)
6 Days Post-Op  ? ?Subjective/Chief Complaint: ?No family at the bedside this morning.  Patient communicating me via phone and also talking in a quiet voice. Overall she feels better, reports less pain and distention. Reports a small amt flatus. No BM yesterday but asking me to put her on the bedpan this morning. ? ?Objective: ?Vital signs in last 24 hours: ?Temp:  [97.3 ?F (36.3 ?C)-98.4 ?F (36.9 ?C)] 98.2 ?F (36.8 ?C) (03/29 5053) ?Pulse Rate:  [99-119] 109 (03/29 0723) ?Resp:  [16-18] 16 (03/29 0723) ?BP: (117-131)/(77-86) 131/80 (03/29 0723) ?SpO2:  [99 %-100 %] 100 % (03/29 0723) ?Last BM Date :  (PTA) ? ?Intake/Output from previous day: ?03/28 0701 - 03/29 0700 ?In: 775.8 [I.V.:775.8] ?Out: 700 [Emesis/NG output:700] ?Intake/Output this shift: ?No intake/output data recorded. ? ?PE: ?Gen: Frail appearing female in NAD ?Abd - mildly distention but improved compared to yesterday, appropriately tender over L femoral hernia incisions wich is c/d/i, NGT with thick bilious output 800cc or more in last 24 hours ?Left inguinal incision - c/d/i ? ?Lab Results:  ?Recent Labs  ?  01/21/22 ?0505 01/22/22 ?0139  ?WBC 5.0 6.6  ?HGB 10.4* 10.1*  ?HCT 32.1* 30.7*  ?PLT 148* 153  ? ?BMET ?Recent Labs  ?  01/21/22 ?0505 01/22/22 ?0139  ?NA 137 136  ?K 3.6 3.9  ?CL 100 99  ?CO2 30 30  ?GLUCOSE 122* 129*  ?BUN 16 19  ?CREATININE 0.48 0.51  ?CALCIUM 8.9 9.3  ? ?PT/INR ?No results for input(s): LABPROT, INR in the last 72 hours. ?ABG ?No results for input(s): PHART, HCO3 in the last 72 hours. ? ?Invalid input(s): PCO2, PO2 ? ?Studies/Results: ?DG Abd Portable 1V ? ?Result Date: 01/21/2022 ?CLINICAL DATA:  Ileus. History of incarcerated left femoral hernia and status post hernia repair and small bowel resection. EXAM: PORTABLE ABDOMEN - 1 VIEW COMPARISON:  CT abdomen and pelvis 01/17/2022 FINDINGS: Single view of the abdomen demonstrates a feeding tube in the left abdomen which is likely in the stomach body region. Gas-filled loops  of small bowel throughout the abdomen and pelvis. Pelvic calcifications compatible with known fibroid uterus. Limited evaluation for free air on this portable supine view. IMPRESSION: Diffusely dilated loops of small bowel throughout the abdomen and pelvis. Findings are suggestive for a postoperative ileus. Feeding tube in the gastric body region. Electronically Signed   By: Markus Daft M.D.   On: 01/21/2022 13:12   ? ?Anti-infectives: ?Anti-infectives (From admission, onward)  ? ? None  ? ?  ? ? ?Assessment/Plan: ?POD 4,S/p emergent left femoral hernia repair with Phasix mesh/ small bowel resection 01/17/22 - Dr. Kieth Brightly for incarcerated left femoral hernia  ?- post-op ileus -Awaiting further return of bowel function; continue NG to LIWS; may be able to do clamp trial soon ?-Protein-calorie malnutrition - severe, on TPN ?-PT/OT  ? ?FEN: NPO, IVF, NG/TPN ?ID: None ?VTE: SCDs, Lovenox ?Foley: None ?Dispo: Continued care on floor, awaiting return of bowel function ? ?Parkinson's disease - per medicine ?Anxiety  ?FTT in adult ? ? LOS: 6 days  ? ? ?[place] ?01/23/2022 ? ?

## 2022-01-23 NOTE — Progress Notes (Signed)
Patient and patients daughter [facility]) refused 1600 Carbidopa-levodopa medication   ?

## 2022-01-23 NOTE — TOC Progression Note (Signed)
Transition of Care (TOC) - Progression Note  ? ? ?Patient Details  ?Name: [name] [name] ?MRN: [id number] ?Date of Birth: Mar 03, 1951 ? ?Transition of Care (TOC) CM/SW Contact  ?Coralee Pesa, LCSWA ?Phone Number: ?01/23/2022, 2:12 PM ? ?Clinical Narrative:    ? ?CSW met with pt and daughter at bedside. They were informed that [facility] as long as pt did not have cortrak or TPN. Daughter is agreeable. TOC will continue to follow for medical readiness. ? ?Expected Discharge Plan: [place] ?Barriers to Discharge: Continued Medical Work up, SNF Pending bed offer ? ?Expected Discharge Plan and Services ?Expected Discharge Plan: [place] ?In-house Referral: Clinical Social Work ?  ?Post Acute Care Choice: [place] ?Living arrangements for the past 2 months: [place] ?                ?  ?  ?  ?  ?  ?  ?  ?  ?  ?  ? ? ?Social Determinants of Health (SDOH) Interventions ?  ? ?Readmission Risk Interventions ?   ? View : No data to display.  ?  ?  ?  ? ? ?

## 2022-01-23 NOTE — Assessment & Plan Note (Signed)
POA, offload and optimize nutritional status as much as possible.  ?

## 2022-01-23 NOTE — Progress Notes (Signed)
Physical Therapy Treatment ?Patient Details ?Name: [name] [name] ?MRN: [id number] ?DOB: [birth year] ?Today's Date: 01/23/2022 ? ? ?History of Present Illness 71 yo female with 1 day of worsening abdominal pain and vomiting, s/p femoral hernia repair with SBR 2/2 gangrene on 3/23. Pt with NGT to suction. Noted poor po intake for about 6 months due to living situation. Daughter reports pt  with wt loss of 20-30# within past few months. Significant PMH: Parkinson disease, anxiety ? ?  ?PT Comments  ? ? Pt with increased fatigue this session but continued good participation despite. Pt able to come to EOB with mod assist for LE management and to elevate trunk. Pt able to come to standing without physical assist but requiring min assist to maintain standing as pt having tendency for posterior lean. Multimodal cues needed for righting and balance in standing with pt able to correct but unable to maintain. Pt tolerating increased time in standing this session and able to side step towards [facility] with min assist for RW management. Pt continues to benefit from skilled PT services to progress toward functional mobility goals.  ?  ?Recommendations for follow up therapy are one component of a multi-disciplinary discharge planning process, led by the attending physician.  Recommendations may be updated based on patient status, additional functional criteria and insurance authorization. ? ?Follow Up Recommendations ? Skilled nursing-short term rehab (<3 hours/day) ?  ?  ?Assistance Recommended at Discharge Frequent or constant Supervision/Assistance  ?Patient can return home with the following A lot of help with walking and/or transfers;A lot of help with bathing/dressing/bathroom ?  ?Equipment Recommendations ? Wheelchair cushion (measurements PT);Wheelchair (measurements PT)  ?  ?Recommendations for Other Services   ? ? ?  ?Precautions / Restrictions Precautions ?Precautions: Fall ?Precaution Comments: watch HR,  NGT ?Restrictions ?Weight Bearing Restrictions: No  ?  ? ?Mobility ? Bed Mobility ?Overal bed mobility: Needs Assistance ?Bed Mobility: Supine to Sit, Sit to Supine ?  ?  ?Supine to sit: Mod assist ?Sit to supine: Mod assist ?  ?General bed mobility comments: able to initiate moving BLE's towards edge of bed, assist at hips to scoot and trunk to upright. Assist for BLE's back into bed ?  ? ?Transfers ?Overall transfer level: Needs assistance ?Equipment used: Rolling walker (2 wheels) ?Transfers: Sit to/from Stand ?Sit to Stand: Min assist ?  ?  ?  ?  ?  ?General transfer comment: min assist to power up and maintain standing, pt with posterior lean and unable to maintain balance independently. pt with very slow but steady rise to standing ?  ? ?Ambulation/Gait ?Ambulation/Gait assistance: Min assist ?Gait Distance (Feet): 3 Feet ?Assistive device: Rolling walker (2 wheels) ?Gait Pattern/deviations: Step-to pattern ?  ?  ?  ?General Gait Details: side stepping to [facility] ? ? ?Stairs ?  ?  ?  ?  ?  ? ? ?Wheelchair Mobility ?  ? ?Modified Rankin (Stroke Patients Only) ?  ? ? ?  ?Balance Overall balance assessment: Needs assistance ?Sitting-balance support: Bilateral upper extremity supported, Feet supported ?Sitting balance-Leahy Scale: Poor ?Sitting balance - Comments: posterior, left lateral lean ?  ?Standing balance support: During functional activity ?Standing balance-Leahy Scale: Poor ?Standing balance comment: reliance on BUE support on RW ?  ?  ?  ?  ?  ?  ?  ?  ?  ?  ?  ?  ? ?  ?Cognition Arousal/Alertness: Awake/alert ?Behavior During Therapy: [facility] for tasks assessed/performed ?Overall Cognitive Status: Within Functional Limits for tasks  assessed ?  ?  ?  ?  ?  ?  ?  ?  ?  ?  ?  ?  ?  ?  ?  ?  ?  ?  ?  ? ?  ?Exercises General Exercises - Lower Extremity ?Ankle Circles/Pumps: AROM, Both, 20 reps, Supine ?Quad Sets: AROM, Right, Left, 10 reps, Supine ?Straight Leg Raises: AROM, Right, Left, 20 reps, Supine ?Hip  Flexion/Marching: AROM, Right, Left, 10 reps, Supine ? ?  ?General Comments   ?  ?  ? ?Pertinent Vitals/Pain Pain Assessment ?Pain Assessment: Faces ?Faces Pain Scale: Hurts little more ?Pain Location: stomach, NG tube (nose) ?Pain Descriptors / Indicators: Pressure, Other (Comment) (bloating) ?Pain Intervention(s): Limited activity within patient's tolerance, Monitored during session, Repositioned  ? ? ?Home Living   ?  ?  ?  ?  ?  ?  ?  ?  ?  ?   ?  ?Prior Function    ?  ?  ?   ? ?PT Goals (current goals can now be found in the care plan section) Acute Rehab PT Goals ?PT Goal Formulation: With patient/family ?Time For Goal Achievement: 02/02/22 ? ?  ?Frequency ? ? ? Min 3X/week ? ? ? ?  ?PT Plan    ? ? ?Co-evaluation   ?  ?  ?  ?  ? ?  ?AM-PAC PT "6 Clicks" Mobility   ?Outcome Measure ? Help needed turning from your back to your side while in a flat bed without using bedrails?: A Lot ?Help needed moving from lying on your back to sitting on the side of a flat bed without using bedrails?: A Lot ?Help needed moving to and from a bed to a chair (including a wheelchair)?: A Lot ?Help needed standing up from a chair using your arms (e.g., wheelchair or bedside chair)?: A Lot ?Help needed to walk in hospital room?: Total ?Help needed climbing 3-5 steps with a railing? : Total ?6 Click Score: 10 ? ?  ?End of Session   ?Activity Tolerance: Patient limited by fatigue ?Patient left: in bed;with call bell/phone within reach;with family/visitor present ?Nurse Communication: Mobility status ?PT Visit Diagnosis: Muscle weakness (generalized) (M62.81);Difficulty in walking, not elsewhere classified (R26.2) ?  ? ? ?Time: 7544-9201 ?PT Time Calculation (min) (ACUTE ONLY): 25 min ? ?Charges:  $Therapeutic Exercise: 8-22 mins ?$Therapeutic Activity: 8-22 mins          ?          ? ?Audry Riles. PTA ?Acute Rehabilitation Services ?Office: [phone] ? ? ? ?Betsey Holiday Rhilynn Preyer ?01/23/2022, 3:25 PM ? ?

## 2022-01-23 NOTE — Progress Notes (Signed)
Nutrition Follow-up ? ?DOCUMENTATION CODES:  ? ?Severe malnutrition in context of social or environmental circumstances, Underweight ? ?INTERVENTION:  ?Continue TPN per pharmacy ? ?New measured weight  ? ? ?NUTRITION DIAGNOSIS:  ? ?Severe Malnutrition related to social / environmental circumstances as evidenced by severe muscle depletion, severe fat depletion, percent weight loss. ? ?Ongoing  ? ?GOAL:  ? ?Patient will meet greater than or equal to 90% of their needs ? ?Ongoing; addressing via TPN  ? ?MONITOR:  ? ?Diet advancement, Labs, Weight trends ? ?REASON FOR ASSESSMENT:  ? ?Malnutrition Screening Tool ?  ? ?ASSESSMENT:  ? ?71 y.o. female presented to the ED with abdominal pain with vomiting and constipation for 7 days. PMH includes Parkinson disease and GERD. Pt admitted with incarcerated left inguinal hernia w/ obstructions. ? ?3/23 - Femoral hernia repair w/ small bowel resection 2/2 gangrene; NGT to LIWS ? ?TPN initiated 3/25. Plan to increase TPN to goal rate of 70 ml/hr today at 1800 to provide 1644 kcal and 92 grams of protein.  ? ?Met with pt at bedside. Pt resting, eyes closed most of visit. Pt's daughter in room during visit and also resting in recliner chair. Per pt's daughter, pt tolerating TPN well and denies any nausea or vomiting. Pt's daughter however reports that pt feels really lethargic in the mornings due to the medications she is currently on and that they have been weaning pt off of narcotics. Pt's daughter reports that pt has not had a BM today and that they are awaiting return of bowel function. Observed NG draining in room and appearing thick dark brown/green fluid in canister.  ? ?Discussed with pt's daughter that pt's TPN rate will be increasing this evening to meet pt's estimated nutritional needs and pt's daughter agreeable to this plan. Will continue to monitor diet advancement.  ? ?Reviewed weight history. Last recorded weight was admission weight at 43.4 kg. Dietetic intern to  recommend new measured weight.  ? ?Labs reviewed and include: CBG's: 124-130 x 24 hours, Potassium: 3.9, Phosphorus: 3.5, Magnesium: 2.0 ? ?Medications reviewed and include:  ? vitamin C  500 mg Oral Daily  ? ?Continuous Infusions: ? TPN ADULT (ION) 60 mL/hr at 01/22/22 1740  ? TPN ADULT (ION)    ? ? ?Diet Order:   ?Diet Order   ? ?       ?  Diet NPO time specified Except for: Sips with Meds, Ice Chips  Diet effective now       ?  ? ?  ?  ? ?  ? ? ?EDUCATION NEEDS:  ? ?No education needs have been identified at this time ? ?Skin:  Skin Assessment: Reviewed RN Assessment ?Skin Integrity Issues:: Stage I ?Stage I: Coccyx ? ?Last BM:  3/28 - type 7 ? ?Height:  ? ?Ht Readings from Last 1 Encounters:  ?01/17/22 _0  (1.702 m)  ? ? ?Weight:  ? ?Wt Readings from Last 1 Encounters:  ?01/17/22 43.4 kg  ? ? ?Ideal Body Weight:  61.4 kg ? ?BMI:  Body mass index is 14.99 kg/m?. ? ?Estimated Nutritional Needs:  ? ?Kcal:  1600 - 1800 ? ?Protein:  80 - 95 grams ? ?Fluid:  >/= 1.6 L ? ? ? ?Maryruth Hancock, Dietetic Intern ?01/23/2022 3:23 PM ?

## 2022-01-24 DIAGNOSIS — G20 Parkinson's disease: Secondary | ICD-10-CM | POA: Diagnosis not present

## 2022-01-24 DIAGNOSIS — K4130 Unilateral femoral hernia, with obstruction, without gangrene, not specified as recurrent: Secondary | ICD-10-CM | POA: Diagnosis not present

## 2022-01-24 DIAGNOSIS — R627 Adult failure to thrive: Secondary | ICD-10-CM | POA: Diagnosis not present

## 2022-01-24 DIAGNOSIS — Z7189 Other specified counseling: Secondary | ICD-10-CM | POA: Diagnosis not present

## 2022-01-24 DIAGNOSIS — F419 Anxiety disorder, unspecified: Secondary | ICD-10-CM | POA: Diagnosis not present

## 2022-01-24 LAB — CBC
HCT: 32.4 % — ABNORMAL LOW (ref 36.0–46.0)
Hemoglobin: 10.4 g/dL — ABNORMAL LOW (ref 12.0–15.0)
MCH: 29.5 pg (ref 26.0–34.0)
MCHC: 32.1 g/dL (ref 30.0–36.0)
MCV: 92 fL (ref 80.0–100.0)
Platelets: 225 10*3/uL (ref 150–400)
RBC: 3.52 MIL/uL — ABNORMAL LOW (ref 3.87–5.11)
RDW: 13.6 % (ref 11.5–15.5)
WBC: 10 10*3/uL (ref 4.0–10.5)
nRBC: 0.3 % — ABNORMAL HIGH (ref 0.0–0.2)

## 2022-01-24 LAB — COMPREHENSIVE METABOLIC PANEL
ALT: 10 U/L (ref 0–44)
AST: 18 U/L (ref 15–41)
Albumin: 2.7 g/dL — ABNORMAL LOW (ref 3.5–5.0)
Alkaline Phosphatase: 50 U/L (ref 38–126)
Anion gap: 9 (ref 5–15)
BUN: 23 mg/dL (ref 8–23)
CO2: 28 mmol/L (ref 22–32)
Calcium: 9.5 mg/dL (ref 8.9–10.3)
Chloride: 99 mmol/L (ref 98–111)
Creatinine, Ser: 0.5 mg/dL (ref 0.44–1.00)
GFR, Estimated: >60 mL/min (ref >60)
Glucose, Bld: 121 mg/dL — ABNORMAL HIGH (ref 70–99)
Potassium: 4.4 mmol/L (ref 3.5–5.1)
Sodium: 136 mmol/L (ref 135–145)
Total Bilirubin: 0.5 mg/dL (ref 0.3–1.2)
Total Protein: 6.3 g/dL — ABNORMAL LOW (ref 6.5–8.1)

## 2022-01-24 LAB — PHOSPHORUS: Phosphorus: 3.9 mg/dL (ref 2.5–4.6)

## 2022-01-24 LAB — MAGNESIUM: Magnesium: 2 mg/dL (ref 1.7–2.4)

## 2022-01-24 MED ORDER — BISACODYL 10 MG RE SUPP
10.0000 mg | Freq: Once | RECTAL | Status: AC
  Administered 2022-01-24: 10 mg via RECTAL
  Filled 2022-01-24: qty 1

## 2022-01-24 MED ORDER — SIMETHICONE 80 MG PO CHEW
80.0000 mg | CHEWABLE_TABLET | Freq: Four times a day (QID) | ORAL | Status: DC | PRN
  Administered 2022-01-24: 80 mg via ORAL
  Filled 2022-01-24 (×2): qty 1

## 2022-01-24 MED ORDER — METHOCARBAMOL 500 MG PO TABS
500.0000 mg | ORAL_TABLET | Freq: Four times a day (QID) | ORAL | Status: DC | PRN
  Administered 2022-01-24 – 2022-01-26 (×2): 500 mg via ORAL
  Filled 2022-01-24 (×2): qty 1

## 2022-01-24 MED ORDER — TRAVASOL 10 % IV SOLN
INTRAVENOUS | Status: AC
  Filled 2022-01-24: qty 924

## 2022-01-24 MED ORDER — MIRTAZAPINE 7.5 MG PO TABS
3.7500 mg | ORAL_TABLET | Freq: Every day | ORAL | Status: DC
  Administered 2022-01-24: 3.75 mg via ORAL
  Filled 2022-01-24 (×2): qty 1

## 2022-01-24 NOTE — Consult Note (Signed)
[facility] New Face-to-Face Psychiatric Evaluation ? ? ?Service Date: January 24, 2022 ?LOS:  LOS: 7 days  ? ? ?Assessment  ?[name] [name] is a 71 y.o. female admitted medically for 01/17/2022  3:19 PM for L inguinal hernia with obstruction and gangrene, s/p open repair and small bowel resection on 3/23. She carries the psychiatric diagnoses of Anxiety and has a past medical history of Parkinson's. Psychiatry was consulted for Anxiety by Patrecia Pour MD.  ? ? ?Her current presentation of high anxiety is most consistent with GAD exacerbated by medical issues. She meets criteria for GAD based on her symptoms and the time frame.  Current outpatient psychotropic medications include Buspar, Lexapro, and Valium and historically she has not had a good response to these medications. She was compliant with medications prior to admission as evidenced by report from patients daughter. On initial examination, patient sitting in bed side chair. Please see plan below for detailed recommendations.  ? ?[name] [name] is having significant issues with anxiety, sleep, and appetite due to her medical conditions.  We will start Remeron as this should treat all of her symptoms.  We will keep the Seroquel as PRN so that if her sleep is still disrupted she can get relief since per the daughter it was effective.  ? ? ?Diagnoses:  ?Active Hospital problems: ?Principal Problem: ?  Femoral hernia of left side with obstruction ?Active Problems: ?  Parkinson disease ([place]) ?  Anxiety ?  Failure to thrive in adult ?  Goals of care, counseling/discussion ?  Pressure injury of coccygeal region, stage 1 ?  ? ? ?Plan  ?## Safety and Observation Level:  ?- Based on my clinical evaluation, I estimate the patient to be at low risk of self harm in the current setting ?- At this time, we recommend a routine level of observation. This decision is based on my review of the chart including patient's history and current presentation, interview of the  patient, mental status examination, and consideration of suicide risk including evaluating suicidal ideation, plan, intent, suicidal or self-harm behaviors, risk factors, and protective factors. This judgment is based on our ability to directly address suicide risk, implement suicide prevention strategies and develop a safety plan while the patient is in the clinical setting. Please contact our team if there is a concern that risk level has changed. ? ? ?## Medications:  ?- Change Seroquel 25 mg QHS from scheduled to PRN ?- START Remeron 3.75 QHS for anxiety, sleep, and appetite stimulation ? ? ?## Medical Decision Making Capacity:  ?Not formally assessed ? ?## Further Work-up:  ?-Per Primary Team ? ? ? ?-- most recent EKG on 02/28/2021 had QtC of 434 ?-- Pertinent labwork reviewed earlier this admission includes:  ?CMP(3/23): WNL except  Ca: 10.4,  AST: 14  CBC(3/23): WNL excepts  WBC: 15.6 ? ?CMP(3/30): WNL except  Total Protein: 6.3,  Albumin: 2.7,  CBC(3/30): WNL except  RBC: 3.52,  Hem: 10.4,  HCT: 32.4,  nRBC: 0.3 ? ?## Disposition:  ?-Per Primary Team ? ?## Behavioral / Environmental:  ?- ? ?##Legal Status ? ? ?Thank you for this consult request. Recommendations have been communicated to the primary team.  We will continue to follow at this time.  ? ?Briant Cedar, MD ? ? ?NEW  History  ?Relevant Aspects of Hospital Course:  ?Admitted on 01/17/2022 for L inguinal hernia with obstruction and gangrene, s/p open repair and small bowel resection on 3/23. ? ?Patient Report:  ?  Daughter present in room with pt consent; another family member on the phone also with pt consent.  ? ?Pt reports largest source of anxiety is being displaced from home since Christmas Eve. Has been in 5 hotels, 2 homes, and now the hospital. Daughter provides that anxiety got worse about 3 months before she was displaced. About 1 week ago pt was in the bed on Wednesday and asked for daughter to give her an enema; the next day patient  seemed more confused and pt brought to the ED; was rushed here for surgery. Have not pinpointed with how to help with anxiety previous to this. Patient shares that her tolerance for medication is very strong. The first thing she tried was lexapro (pt feels is habit forming), then valium (pt feels is habit forming). Endorses feeling of lingering "loopy, out of body" experience after taking valium. Had tried buspirone first which pt also felt can't move, stoic, and out of body. Was taking 1/4 of a 5 mg buspirone which made pt feel loopy, out of it, and was since stopped. Benadryl was also a disaster and overly sedating. Someone talked to family about zoloft and family feels that is "not good" due to daughter's research about it being habit forming and putting patients into a stoic stage. Patient and family is concerned about any interaction with Parkinson's disease. Family member on the phone's main concern is SSRI.  ? ?Some debate in family over role seroquel plays in pt's stiffness; generally existed prior to medication started.  But the daughter did report that the Seroquel was effective in helping the patient sleep. ? ?Patient and family deny any history of depressive symptoms. Deny any history of manic symptoms.  Deny any psychotic symptoms.  Deny any abuse or trauma. ? ?Daughter tends to talk over the patient who agrees with what she says. Patient's voice is nearly inaudible. When asked she reports no SI, HI, or AVH. ? ? ? ?Collateral information:  ?Per daughter present in the room ? ?Psychiatric History:  ?Information collected from patient and patients daughter ? ?Family psych history: No known history of diagnosis', substance abuse, or Suicides. ? ? ?Social History:  ? ? ?Tobacco use: None Reported ?Alcohol use: None Reported ?Drug use: None Reported ? ?Family History:  ? ?The patient's family history includes Breast cancer in her mother and sister; Cancer in her father; Healthy in her daughter; Heart attack  in her son; Hyperlipidemia in her brother; Hypertension in her brother; Rheum arthritis in her mother. ? ?Medical History: ?Past Medical History:  ?Diagnosis Date  ?? Fibroid   ?? GERD (gastroesophageal reflux disease)   ?? Muscle weakness of lower extremity 01/17/2015  ?? Osteopenia   ?? Parkinson disease ([place]) 01/17/2015  ? ? ?Surgical History: ?Past Surgical History:  ?Procedure Laterality Date  ?? FINGER SURGERY    ?? INGUINAL HERNIA REPAIR Left 01/17/2022  ? Procedure: Left femoral hernia repair with mesh and small bowel resection and anastomosis;  Surgeon: Kieth Brightly, Arta Bruce, MD;  Location: [facility];  Service: General;  Laterality: Left;  ?? MOUTH SURGERY    ?? TOE SURGERY    ?? TONSILLECTOMY    ?? TUBAL LIGATION    ? x2  ? ? ?Medications:  ? ?Current Facility-Administered Medications:  ??  acetaminophen (TYLENOL) tablet 650 mg, 650 mg, Oral, Q6H PRN, 650 mg at 01/24/22 1242 **OR** acetaminophen (TYLENOL) suppository 650 mg, 650 mg, Rectal, Q6H PRN, Kinsinger, Arta Bruce, MD ??  ascorbic acid (VITAMIN C) tablet 500  mg, 500 mg, Oral, Daily, Kinsinger, Arta Bruce, MD, 500 mg at 01/24/22 1004 ??  carbidopa-levodopa (SINEMET IR) 25-250 MG per tablet immediate release 1 tablet, 1 tablet, Oral, TID, Kinsinger, Arta Bruce, MD, 1 tablet at 01/24/22 1004 ??  Chlorhexidine Gluconate Cloth 2 % PADS 6 each, 6 each, Topical, Daily, Donnie Mesa, MD, 6 each at 01/24/22 1242 ??  dextrose 5 %-0.45 % sodium chloride infusion, , Intravenous, Continuous, Franky Macho, RPH, Last Rate: 10 mL/hr at 01/20/22 1925, Rate Change at 01/20/22 1925 ??  enoxaparin (LOVENOX) injection 30 mg, 30 mg, Subcutaneous, Q24H, Kinsinger, Arta Bruce, MD, 30 mg at 01/23/22 2125 ??  melatonin tablet 3 mg, 3 mg, Oral, QHS, Little Ishikawa, MD, 3 mg at 01/23/22 2125 ??  metoprolol tartrate (LOPRESSOR) injection 5 mg, 5 mg, Intravenous, Q6H PRN, Kinsinger, Arta Bruce, MD, 5 mg at 01/24/22 0414 ??  mirtazapine (REMERON) tablet 3.75 mg, 3.75 mg, Oral,  QHS, Katja Blue, Redgie Grayer, MD ??  ondansetron (ZOFRAN-ODT) disintegrating tablet 4 mg, 4 mg, Oral, Q6H PRN **OR** ondansetron (ZOFRAN) injection 4 mg, 4 mg, Intravenous, Q6H PRN, Kinsinger, Arta Bruce

## 2022-01-24 NOTE — Progress Notes (Signed)
Physical Therapy Treatment ?Patient Details ?Name: [name] [name] ?MRN: [id number] ?DOB: February 04, 1951 ?Today's Date: 01/24/2022 ? ? ?History of Present Illness 71 yo female with 1 day of worsening abdominal pain and vomiting, s/p femoral hernia repair with SBR 2/2 gangrene on 3/23. Pt with NGT to suction. Noted poor po intake for about 6 months due to living situation. Daughter reports pt  with wt loss of 20-30# within past few months. Significant PMH: Parkinson disease, anxiety ? ?  ?PT Comments  ? ? Pt with continued progress this session. Pt able to tolerate standing EOB for ~10 mins with intermittent min a to maintain balance as pt has tendency for posterior lean and very poor/absent ankle/knee/hip righting strategy to correct.  Cues to shift weight over toes and push through RW to maintain upright posture. Pt able to correct with assist but unable to maintain. Mod a to stand pivot transfer from EOB to recliner with hand held assist. Pt continues to benefit from skilled PT services to progress toward functional mobility goals.  ?  ?Recommendations for follow up therapy are one component of a multi-disciplinary discharge planning process, led by the attending physician.  Recommendations may be updated based on patient status, additional functional criteria and insurance authorization. ? ?Follow Up Recommendations ? Skilled nursing-short term rehab (<3 hours/day) ?  ?  ?Assistance Recommended at Discharge Frequent or constant Supervision/Assistance  ?Patient can return home with the following A lot of help with walking and/or transfers;A lot of help with bathing/dressing/bathroom ?  ?Equipment Recommendations ? Wheelchair cushion (measurements PT);Wheelchair (measurements PT)  ?  ?Recommendations for Other Services   ? ? ?  ?Precautions / Restrictions Precautions ?Precautions: Fall ?Precaution Comments: watch HR, NGT ?Restrictions ?Weight Bearing Restrictions: No  ?  ? ?Mobility ? Bed Mobility ?Overal bed mobility:  Needs Assistance ?Bed Mobility: Supine to Sit ?  ?  ?Supine to sit: Min assist ?  ?  ?General bed mobility comments: able to initiate moving BLE's towards edge of bed, min assist at hips to scoot and trunk to upright. ?  ? ?Transfers ?Overall transfer level: Needs assistance ?Equipment used: Rolling walker (2 wheels) ?Transfers: Sit to/from Stand ?Sit to Stand: Min assist ?Stand pivot transfers: Mod assist ?  ?  ?  ?  ?General transfer comment: min assist to power up and maintain standing, pt with posterior lean, able to intermitently maintain balance independently. pt with very slow but steady rise to standing ?  ? ?Ambulation/Gait ?Ambulation/Gait assistance: Min assist ?Gait Distance (Feet): 2 Feet ?Assistive device: Rolling walker (2 wheels) ?Gait Pattern/deviations: Step-to pattern ?  ?  ?  ?General Gait Details: side stepping to [facility] ? ? ?Stairs ?  ?  ?  ?  ?  ? ? ?Wheelchair Mobility ?  ? ?Modified Rankin (Stroke Patients Only) ?  ? ? ?  ?Balance Overall balance assessment: Needs assistance ?Sitting-balance support: Bilateral upper extremity supported, Feet supported ?Sitting balance-Leahy Scale: Poor ?Sitting balance - Comments: posterior, left lateral lean ?  ?Standing balance support: During functional activity ?Standing balance-Leahy Scale: Poor ?Standing balance comment: reliance on BUE support on RW ?  ?  ?  ?  ?  ?  ?  ?  ?  ?  ?  ?  ? ?  ?Cognition Arousal/Alertness: Awake/alert ?Behavior During Therapy: [facility] for tasks assessed/performed ?Overall Cognitive Status: Within Functional Limits for tasks assessed ?  ?  ?  ?  ?  ?  ?  ?  ?  ?  ?  ?  ?  ?  ?  ?  ?  ?  ?  ? ?  ?  Exercises General Exercises - Lower Extremity ?Ankle Circles/Pumps: AROM, Both, 20 reps, Supine ?Long Arc Quad: AROM, Right, Left, 10 reps, Seated ?Hip Flexion/Marching: AROM, Right, Left, 20 reps ? ?  ?General Comments   ?  ?  ? ?Pertinent Vitals/Pain Pain Assessment ?Pain Assessment: No/denies pain ?Pain Descriptors / Indicators:   (bloating)  ? ? ?Home Living   ?  ?  ?  ?  ?  ?  ?  ?  ?  ?   ?  ?Prior Function    ?  ?  ?   ? ?PT Goals (current goals can now be found in the care plan section) Acute Rehab PT Goals ?PT Goal Formulation: With patient/family ?Time For Goal Achievement: 02/02/22 ? ?  ?Frequency ? ? ? Min 3X/week ? ? ? ?  ?PT Plan    ? ? ?Co-evaluation   ?  ?  ?  ?  ? ?  ?AM-PAC PT "6 Clicks" Mobility   ?Outcome Measure ? Help needed turning from your back to your side while in a flat bed without using bedrails?: A Lot ?Help needed moving from lying on your back to sitting on the side of a flat bed without using bedrails?: A Lot ?Help needed moving to and from a bed to a chair (including a wheelchair)?: A Lot ?Help needed standing up from a chair using your arms (e.g., wheelchair or bedside chair)?: A Lot ?Help needed to walk in hospital room?: Total ?Help needed climbing 3-5 steps with a railing? : Total ?6 Click Score: 10 ? ?  ?End of Session Equipment Utilized During Treatment: Gait belt ?Activity Tolerance: Patient limited by fatigue ?Patient left: in chair;with call bell/phone within reach;with family/visitor present ?Nurse Communication: Mobility status ?PT Visit Diagnosis: Muscle weakness (generalized) (M62.81);Difficulty in walking, not elsewhere classified (R26.2) ?  ? ? ?Time: 0867-6195 ?PT Time Calculation (min) (ACUTE ONLY): 26 min ? ?Charges:  $Gait Training: 8-22 mins ?$Therapeutic Activity: 8-22 mins          ?          ? ?Audry Riles. PTA ?Acute Rehabilitation Services ?Office: [phone] ? ? ? ?Betsey Holiday Graysen Depaula ?01/24/2022, 11:01 AM ? ?

## 2022-01-24 NOTE — Progress Notes (Signed)
Daughter wanted a neurology consult for mother regarding Parkinsons, this nurse told her that I will be handing this over to day shift to discuss with primary team this morning and that ordering referrals is beyond the scope of nurses, she  asked to speak on phone with Charge nurse  regarding various concerns,this nurse relayed request to Charge nurse who is unable to go to patient's room at the moment. This nurse came back to room to inform the daughter on the phone about charge nurse unavailability but she wasn't on the phone any more with patient, explained to patient instead. ?

## 2022-01-24 NOTE — Plan of Care (Signed)
?  Problem: Elimination: ?Goal: Will not experience complications related to bowel motility ?Outcome: Not Progressing ?  ?Problem: Nutrition: ?Goal: Adequate nutrition will be maintained ?Outcome: Not Progressing ?  ?Problem: Nutrition: ?Goal: Adequate nutrition will be maintained ?Outcome: Not Progressing ?  ?

## 2022-01-24 NOTE — Progress Notes (Signed)
?Progress Note ? ?Patient: [name] [name] ZOX:[id number] DOB: 1951/03/16  ?DOA: 01/17/2022  DOS: 01/24/2022  ?  ?Brief hospital course: ?[name] [name] is a 71 y.o. female with past medical history of Parkinson's disease and anxiety who presented on 3/23 with abdominal pain, admitted for incarcerated left femoral hernia requiring open repair and resection of necrotic small bowel 3/23. Postoperative ileus is being treated supportively by primary team with hospitalists consulted for medical management. She has declined to take sinemet as prescribed as an outpatient and requests neurology consultation. Hospitalization also complicated by anxiety exacerbated by acute illness for which she and her family request psychiatry consultation.  ? ?Assessment and Plan: ?* Femoral hernia of left side with obstruction ?s/p resection 01/17/2022.  ?- Management per surgery ? ?Pressure injury of coccygeal region, stage 1 ?POA, offload and optimize nutritional status as much as possible.  ? ?Failure to thrive in adult ?- Severely malnourished baseline clearly predates her acute illness. BMI is < 15.  ?- Palliative is consulted, currently plan is for aggressive measures. Goal is to "get well" back to PLOF. Will pursue SNF placement (if able to liberate from NGT and TPN).  ?- Treating deconditioning with therapy and malnutrition with TPN as above. ? ?Anxiety ?- Has been on buspar '5mg'$  BID, valium '2mg'$  (though this was prn vertigo).  ?- Currently has to be NPO, if this is anticipated to be a prolonged need, may bee psychiatry recommendation Re: depots. ? ?Parkinson disease ([place]) ?No alternative to sinemet, must attempt to administer enterally. No bradykinesia to suggest acute issue. ? ?Subjective: Does not phonate today, but types answers onto her phone. She would like neurology and psychiatry consulted as she's anxious and has very many adverse reactions to medications. When she takes sinemet as prescribed she says it actually causes  "overwhelming anxiety and actually more rigidity." Having poor appetite and poor sleep. Took NG out and has had no N/V since that. ? ?Objective: ?Vitals:  ? 01/24/22 0359 01/24/22 0428 01/24/22 0842 01/24/22 1610  ?BP: 117/76  114/71 123/77  ?Pulse: (!) 134 92 (!) 101 (!) 103  ?Resp: '16  18 16  '$ ?Temp: (!) 97.5 ?F (36.4 ?C)  97.7 ?F (36.5 ?C) (!) 97.4 ?F (36.3 ?C)  ?TempSrc: Oral   Oral  ?SpO2: 100%  100% 100%  ?Weight:      ?Height:      ? ?Gen: Cachectic female in no distress ?Pulm: Nonlabored breathing room air. Clear. ?CV: Regular rate and rhythm. No murmur, rub, or gallop. No JVD, no pitting dependent edema. 2+ distal pulses. ?GI: Abdomen soft, modestly tender, non-distended, with normoactive bowel sounds.  ?Ext: Warm, no deformities ?Skin: No new rashes, lesions or ulcers on visualized skin. ?Neuro: Alert and oriented. No verbalization but speech comprehension and expression are intact. No focal neurological deficits. ?Psych: Judgement and insight appear fair. Mood anxious and pleasant & affect congruent. Behavior is appropriate.   ? ?Data Personally reviewed: ? ?CBC: ?Recent Labs  ?Lab 01/19/22 ?0436 01/20/22 ?0130 01/21/22 ?0505 01/22/22 ?0139 01/24/22 ?0411  ?WBC 10.5 5.2 5.0 6.6 10.0  ?HGB 12.0 11.2* 10.4* 10.1* 10.4*  ?HCT 36.2 33.5* 32.1* 30.7* 32.4*  ?MCV 90.7 90.3 90.4 90.8 92.0  ?PLT 141* 123* 148* 153 225  ? ?Basic Metabolic Panel: ?Recent Labs  ?Lab 01/19/22 ?0436 01/19/22 ?8119 01/20/22 ?0130 01/21/22 ?0505 01/22/22 ?0139 01/24/22 ?0400  ?NA 135  --  135 137 136 136  ?K 3.5  --  3.4* 3.6 3.9 4.4  ?CL 101  --  99 100 99 99  ?CO2 25  --  '29 30 30 28  '$ ?GLUCOSE 117*  --  131* 122* 129* 121*  ?BUN 20  --  '20 16 19 23  '$ ?CREATININE 0.75  --  0.53 0.48 0.51 0.50  ?CALCIUM 9.3  --  9.1 8.9 9.3 9.5  ?MG  --  1.9 2.0 1.7 2.0 2.0  ?PHOS  --  2.7 2.2* 2.2* 3.5 3.9  ? ?GFR: ?Estimated Creatinine Clearance: 44.2 mL/min (by C-G formula based on SCr of 0.5 mg/dL). ?Liver Function Tests: ?Recent Labs  ?Lab  01/19/22 ?0436 01/20/22 ?0130 01/21/22 ?0505 01/22/22 ?0139 01/24/22 ?0400  ?AST 16 13* 14* 12* 18  ?ALT '7 7 10 9 10  '$ ?ALKPHOS 37* 35* 30* 34* 50  ?BILITOT 1.3* 1.4* 1.0 0.8 0.5  ?PROT 6.0* 5.6* 5.8* 5.8* 6.3*  ?ALBUMIN 3.2* 2.7* 2.6* 2.7* 2.7*  ? ?No results for input(s): LIPASE, AMYLASE in the last 168 hours. ? ?No results for input(s): AMMONIA in the last 168 hours. ?Coagulation Profile: ?No results for input(s): INR, PROTIME in the last 168 hours. ?Cardiac Enzymes: ?No results for input(s): CKTOTAL, CKMB, CKMBINDEX, TROPONINI in the last 168 hours. ?BNP (last 3 results) ?No results for input(s): PROBNP in the last 8760 hours. ?HbA1C: ?No results for input(s): HGBA1C in the last 72 hours. ?CBG: ?Recent Labs  ?Lab 01/21/22 ?1549 01/22/22 ?0016 01/22/22 ?4782 01/22/22 ?1216 01/22/22 ?1713  ?GLUCAP 123* 126* 124* 128* 130*  ? ?Lipid Profile: ?No results for input(s): CHOL, HDL, LDLCALC, TRIG, CHOLHDL, LDLDIRECT in the last 72 hours. ? ?Thyroid Function Tests: ?No results for input(s): TSH, T4TOTAL, FREET4, T3FREE, THYROIDAB in the last 72 hours. ?Anemia Panel: ?No results for input(s): VITAMINB12, FOLATE, FERRITIN, TIBC, IRON, RETICCTPCT in the last 72 hours. ?Urine analysis: ?   ?Component Value Date/Time  ? COLORURINE YELLOW 01/18/2022 1125  ? APPEARANCEUR CLEAR 01/18/2022 1125  ? LABSPEC >1.046 (H) 01/18/2022 1125  ? PHURINE 5.0 01/18/2022 1125  ? GLUCOSEU NEGATIVE 01/18/2022 1125  ? HGBUR NEGATIVE 01/18/2022 1125  ? [place] NEGATIVE 01/18/2022 1125  ? KETONESUR 20 (A) 01/18/2022 1125  ? PROTEINUR 30 (A) 01/18/2022 1125  ? UROBILINOGEN 0.2 10/02/2012 1223  ? NITRITE NEGATIVE 01/18/2022 1125  ? LEUKOCYTESUR NEGATIVE 01/18/2022 1125  ? ?Recent Results (from the past 240 hour(s))  ?Resp Panel by RT-PCR (Flu A&B, Covid) Nasopharyngeal Swab     Status: None  ? Collection Time: 01/17/22  6:11 PM  ? Specimen: Nasopharyngeal Swab; Nasopharyngeal(NP) swabs in vial transport medium  ?Result Value Ref Range Status  ?  SARS Coronavirus 2 by RT PCR NEGATIVE NEGATIVE Final  ?  Comment: (NOTE) ?SARS-CoV-2 target nucleic acids are NOT DETECTED. ? ?The SARS-CoV-2 RNA is generally detectable in upper respiratory ?specimens during the acute phase of infection. The lowest ?concentration of SARS-CoV-2 viral copies this assay can detect is ?138 copies/mL. A negative result does not preclude SARS-Cov-2 ?infection and should not be used as the sole basis for treatment or ?other patient management decisions. A negative result may occur with  ?improper specimen collection/handling, submission of specimen other ?than nasopharyngeal swab, presence of viral mutation(s) within the ?areas targeted by this assay, and inadequate number of viral ?copies(<138 copies/mL). A negative result must be combined with ?clinical observations, patient history, and epidemiological ?information. The expected result is Negative. ? ?Fact Sheet for Patients:  ?EntrepreneurPulse.com.au ? ?Fact Sheet for Healthcare Providers:  ?IncredibleEmployment.be ? ?This test is no t yet approved or cleared by the [place]  and  ?has been authorized for detection and/or diagnosis of SARS-CoV-2 by ?FDA under an Emergency Use Authorization (EUA). This EUA will remain  ?in effect (meaning this test can be used) for the duration of the ?COVID-19 declaration under Section 564(b)(1) of the Act, 21 ?U.S.C.section 360bbb-3(b)(1), unless the authorization is terminated  ?or revoked sooner.  ? ? ?  ? Influenza A by PCR NEGATIVE NEGATIVE Final  ? Influenza B by PCR NEGATIVE NEGATIVE Final  ?  Comment: (NOTE) ?The Xpert Xpress SARS-CoV-2/FLU/RSV plus assay is intended as an aid ?in the diagnosis of influenza from Nasopharyngeal swab specimens and ?should not be used as a sole basis for treatment. Nasal washings and ?aspirates are unacceptable for Xpert Xpress SARS-CoV-2/FLU/RSV ?testing. ? ?Fact Sheet for  Patients: ?EntrepreneurPulse.com.au ? ?Fact Sheet for Healthcare Providers: ?IncredibleEmployment.be ? ?This test is not yet approved or cleared by the [place] FDA and ?has been authorized for detection and/or diagnosis of

## 2022-01-24 NOTE — Progress Notes (Signed)
7 Days Post-Op  ? ?Subjective/Chief Complaint: ?No family at the bedside this morning.  Patient communicating me via phone and also talking in a quiet voice. Overall she feels better, reports less pain and distention. Reports flatus. No BM yesterday, no BM since suppository about 48 hours ago. Denies belching, nausea, or vomiting with NG tube removed. ? ?Objective: ?Vital signs in last 24 hours: ?Temp:  [97.5 ?F (36.4 ?C)-99.1 ?F (37.3 ?C)] 97.7 ?F (36.5 ?C) (03/30 3785) ?Pulse Rate:  [92-134] 101 (03/30 0842) ?Resp:  [16-18] 18 (03/30 8850) ?BP: (114-128)/(71-81) 114/71 (03/30 2774) ?SpO2:  [100 %] 100 % (03/30 0842) ?Last BM Date : 01/20/22 (post suppository) ? ?Intake/Output from previous day: ?03/29 0701 - 03/30 0700 ?In: 577.5 [I.V.:577.5] ?Out: 800 [Emesis/NG output:800] ?Intake/Output this shift: ?No intake/output data recorded. ? ?PE: ?Gen: Frail appearing female in NAD ?Abd - mild distention stable compared to yesterday, L femoral hernia incision c/d/I, +BS  ? ?Lab Results:  ?Recent Labs  ?  01/22/22 ?0139 01/24/22 ?0411  ?WBC 6.6 10.0  ?HGB 10.1* 10.4*  ?HCT 30.7* 32.4*  ?PLT 153 225  ? ?BMET ?Recent Labs  ?  01/22/22 ?0139 01/24/22 ?0400  ?NA 136 136  ?K 3.9 4.4  ?CL 99 99  ?CO2 30 28  ?GLUCOSE 129* 121*  ?BUN 19 23  ?CREATININE 0.51 0.50  ?CALCIUM 9.3 9.5  ? ?PT/INR ?No results for input(s): LABPROT, INR in the last 72 hours. ?ABG ?No results for input(s): PHART, HCO3 in the last 72 hours. ? ?Invalid input(s): PCO2, PO2 ? ?Studies/Results: ?No results found. ? ?Anti-infectives: ?Anti-infectives (From admission, onward)  ? ? None  ? ?  ? ? ?Assessment/Plan: ?POD 4,S/p emergent left femoral hernia repair with Phasix mesh/ small bowel resection 01/17/22 - Dr. Kieth Brightly for incarcerated left femoral hernia  ?- post-op ileus -Awaiting further return of bowel function; having flatus but no BM  ?- allow CLD, SLP following  ?- give suppository today ?-Protein-calorie malnutrition - severe, on TPN ?-PT/OT  ? ?FEN:  NPO, IVF, NG/TPN ?ID: None ?VTE: SCDs, Lovenox ?Foley: None ?Dispo: Continued care on floor, awaiting return of bowel function ? ?Parkinson's disease - per medicine; psych consult pending  ?Anxiety  ?FTT in adult ? ? LOS: 7 days  ? ? ?[place] ?01/24/2022 ? ?

## 2022-01-24 NOTE — Progress Notes (Signed)
Heart rate is sustained at 130's, patient complaining of leg spasm not in apparent distress at the moment. Gershon Cull NP , and Romana Juniper informed , per MD Kae Heller, may give Lopressor PRN now even though parameters ordered for SBP>180. Daughter on phone with mother updated with care and treatment plan. [place] Camera operator updated. Rechecked [place] post administration now back to green, HR now at 92.  ? ? ? 01/24/22 0359  ?Assess: MEWS Score  ?Temp (!) 97.5 ?F (36.4 ?C)  ?BP 117/76  ?Pulse Rate (!) 134  ?Resp 16  ?SpO2 100 %  ?O2 Device Room Air  ?Assess: MEWS Score  ?MEWS Temp 0  ?MEWS Systolic 0  ?MEWS Pulse 3  ?MEWS RR 0  ?MEWS LOC 0  ?MEWS Score 3  ?MEWS Score Color Yellow  ?Assess: if the MEWS score is Yellow or Red  ?Were vital signs taken at a resting state? Yes  ?Focused Assessment No change from prior assessment  ?Early Detection of Sepsis Score *See Row Information* Low  ?MEWS guidelines implemented *See Row Information* Yes  ?Treat  ?MEWS Interventions Administered prn meds/treatments  ?Escalate  ?MEWS: Escalate Yellow: discuss with charge nurse/RN and consider discussing with provider and RRT  ?Notify: Charge Nurse/RN  ?Name of Charge Nurse/RN Notified [place] RN  ?Date Charge Nurse/RN Notified 01/24/22  ?Time Charge Nurse/RN Notified [phone]  ?Notify: Provider  ?Provider Name/Title james daniel and Romana Juniper  ?Date Provider Notified 01/24/22  ?Time Provider Notified 0400  ?Notification Type Page  ?Notification Reason Critical result  ?Provider response See new orders  ?Date of Provider Response 01/24/22  ?Time of Provider Response [phone]  ?Document  ?Patient Outcome Stabilized after interventions  ? ? ?

## 2022-01-24 NOTE — Progress Notes (Signed)
PHARMACY - TOTAL PARENTERAL NUTRITION CONSULT NOTE  ? ?Indication: Prolonged ileus ? ?Patient Measurements: ?Height: '5\' 7"'$  (170.2 cm) ?Weight: 43.4 kg (95 lb 10.9 oz) ?IBW/kg (Calculated) : 61.6 ?TPN AdjBW (KG): 43.4 ?Body mass index is 14.99 kg/m?. ?Usual Weight: ~115 lbs ? ?Assessment:  ?71 y.o. F s/p femoral hernia repair with SBR 2/2 gangrene on 3/23. Pt with NGT to suction. Noted poor po intake for about 6 months due to living situation. Daughter reports pt UBW ~115 lbs with wt loss of 20-30 lbs within past few months. No flatus or BM. Pharmacy consulted to start TPN for ileus in pt with severe PCM upon admission. ? ?Glucose / Insulin: BGs < 140 - requiring no insulin.  ?Electrolytes: coCa up at 10.5, others wnl   ?Renal: SCr stable ?Hepatic: alb 2.7, LFTs/T.bili wnl , TG 46 ?Intake / Output; MIVF: UOP unmeasured; + flatus, last stool charted 3/27 but very small and liquid only - NGT removed 3/29. ?Current MIVF: D51/2NS at 10 ml/hr ?GI Imaging: ?3/23 Incarcerated left inguinal small-bowel hernia with high-grade small ?bowel obstruction. ?GI Surgeries / Procedures:  ?3/23 femoral hernia repair with SBR ? ?Central access: PICC placed 3/25  ?TPN start date: 01/19/22 ? ?Nutritional Goals: ?Goal TPN rate is 70 mL/hr (provides 92 g of protein and 1644 kcals per day) ? ?RD Assessment: ?Estimated Needs ?Total Energy Estimated Needs: 1600 - 1800 ?Total Protein Estimated Needs: 80 - 95 grams ?Total Fluid Estimated Needs: >/= 1.6 L ? ?Current Nutrition:  ?Clear liquids - started 3/30 ?TPN at 70 ml/hr - providing 100% of needs ? ?Plan:  ?Continue TPN at goal rate of 70 mL/hr at 1800  ?Electrolytes in TPN:  Na 86 mEq/L, K 60 mEq/L, Mag 7 mEq/L,  Phos to 20 mmol/L, Ca 4.5 mEq/L, and Cl:Ac 1:1 ?Continue standard MVI and trace elements to TPN ?Continue thiamine '100mg'$  daily x 5 days (3/25>>3/29) ?Monitor TPN labs daily until stable then on Mon/Thurs ?Monitor oral intake ?*Repeat BMP in AM - watch K trend ? ?Sloan Leiter,  PharmD ?Clinical Pharmacist ?Please refer to [facility] for [place] numbers ?01/24/2022 7:50 AM  ? ?Please refer to [facility] for pharmacy phone number ? ? ?

## 2022-01-24 NOTE — Consult Note (Signed)
NEUROLOGY CONSULTATION NOTE  ? ?Date of service: January 24, 2022 ?Patient Name: [name] [name] ?MRN:  [id number] ?DOB:  1951/03/18 ?Reason for consult: "Parkinson Disease" ?Requesting Provider: Edison Pace Md, MD ? ?History of Present Illness  ?[name] [name] is a 71 y.o. female with a past medical history of Parkinson disease, failure to thrive, and osteoarthritis.  She presented with abdominal pain on 3/23 and was found to have an incarcerated left femoral hernia.  She is currently s/p open repair and resection of necrotic bowel.  She has had a prolonged ileus and is currently on TPN.  Up until yesterday evening she had an NG tube to suction with large output.  On interview this afternoon she has been tolerating NG removal well and is eating a few bites of Jell-O. ? ?The patient's voice is very soft and her daughter is at bedside to help explain what she is saying.  They report that the current dosage of carbidopa/levodopa is too high, making the patient "rigid and lethargic".  They report (and the MAR confirms) that they have been refusing the afternoon dose of Sinemet.  They report that even as an outpatient the prescribed dose of Sinemet is "too powerful", and the patient has only been taking the nighttime dose.  They report having traveled to [place] at one point, at which time a doctor prescribed them Modapar (which is levodopa combined with a different decarboxylase inhibitor).  They feel this medication worked much better for her and state they wish to try to find this medication again. ?  ?ROS  ? ?Constitutional +weight loss, negative for fever/chills  ?HEENT Denies changes in vision and hearing.   ?Respiratory Denies SOB and cough.   ?CV Denies palpitations and CP   ?GI +constipation  ?GU Denies dysuria and urinary frequency.   ?MSK Denies myalgia and joint pain.   ?Skin Denies rash and pruritus.   ?Neurological Denies headache and syncope.   ?   ? ?Past History  ? ?Past Medical History:  ?Diagnosis Date  ??  Fibroid   ?? GERD (gastroesophageal reflux disease)   ?? Muscle weakness of lower extremity 01/17/2015  ?? Osteopenia   ?? Parkinson disease ([place]) 01/17/2015  ? ?Past Surgical History:  ?Procedure Laterality Date  ?? FINGER SURGERY    ?? INGUINAL HERNIA REPAIR Left 01/17/2022  ? Procedure: Left femoral hernia repair with mesh and small bowel resection and anastomosis;  Surgeon: Kieth Brightly, Arta Bruce, MD;  Location: [facility];  Service: General;  Laterality: Left;  ?? MOUTH SURGERY    ?? TOE SURGERY    ?? TONSILLECTOMY    ?? TUBAL LIGATION    ? x2  ? ?Family History  ?Problem Relation Age of Onset  ?? Rheum arthritis Mother   ?? Breast cancer Mother   ?     71's  ?? Cancer Father   ?     throat  ?? Breast cancer Sister   ?     41's  ?? Hypertension Brother   ?? Hyperlipidemia Brother   ?? Heart attack Son   ?     Deceased, 19  ?? Healthy Daughter   ?? Parkinsonism Neg Hx   ? ?Social History  ? ?Socioeconomic History  ?? Marital status: Divorced  ?  Spouse name: Not on file  ?? Number of children: 4  ?? Years of education: B.A.  ?? Highest education level: Not on file  ?Occupational History  ?? Occupation: SECRETARY  ?Tobacco Use  ?? Smoking status:  Never  ?? Smokeless tobacco: Never  ?Vaping Use  ?? Vaping Use: Never used  ?Substance and Sexual Activity  ?? Alcohol use: No  ?  Alcohol/week: 0.0 standard drinks  ?? Drug use: No  ?? Sexual activity: Never  ?Other Topics Concern  ?? Not on file  ?Social History Narrative  ? Patient is right handed.  ? Patient drinks 2-3 cups of caffeine per day.  ? Lives alone in a 2 story home.  Has 4 living children.  1 passed away at age 12.    ? 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 Highest level of education: college  ? ?Social Determinants of Health  ? ?Financial Resource Strain: Low Risk   ?? Difficulty of Paying Living Expenses: Not hard at all  ?Food Insecurity: No Food Insecurity  ?? Worried About Charity fundraiser in the Last Year: Never true  ??  Ran Out of Food in the Last Year: Never true  ?Transportation Needs: No Transportation Needs  ?? Lack of Transportation (Medical): No  ?? Lack of Transportation (Non-Medical): No  ?Physical Activity: Unknown  ?? Days of Exercise per Week: 0 days  ?? Minutes of Exercise per Session: Not on file  ?Stress: No Stress Concern Present  ?? Feeling of Stress : Not at all  ?Social Connections: Moderately Isolated  ?? Frequency of Communication with Friends and Family: More than three times a week  ?? Frequency of Social Gatherings with Friends and Family: Twice a week  ?? Attends Religious Services: More than 4 times per year  ?? Active Member of Clubs or Organizations: No  ?? Attends Archivist Meetings: Never  ?? Marital Status: Divorced  ? ?Allergies  ?Allergen Reactions  ?? Sulfa Antibiotics Other (See Comments)  ?  Childhood allergy   ? ? ?Medications  ? ?Medications Prior to Admission  ?Medication Sig Dispense Refill Last Dose  ?? Ascorbic Acid (VITAMIN C PO) Take 1 tablet by mouth daily.   Past Week  ?? b complex vitamins capsule Take 1 capsule by mouth daily.   Past Week  ?? busPIRone (BUSPAR) 5 MG tablet TAKE ONE TABLET BY MOUTH TWICE DAILY (Patient taking differently: Take 5 mg by mouth 2 (two) times daily.) 180 tablet 0 3.22.23  ?? Carbidopa-Levodopa ER (SINEMET CR) 25-100 MG tablet controlled release Take 2 tablets by mouth at bedtime.   3.22.23  ?? Cholecalciferol (VITAMIN D) 2000 UNITS CAPS Take 2,000 Units by mouth daily.   Past Month  ?? diazepam (VALIUM) 2 MG tablet Take 0.5 tablets (1 mg total) by mouth every 12 (twelve) hours as needed (vertigo). 30 tablet 0 unk  ?? glucosamine-chondroitin 500-400 MG tablet Take 1 tablet by mouth daily.   Past Month  ?? ibuprofen (ADVIL,MOTRIN) 200 MG tablet Take 400 mg by mouth every 6 (six) hours as needed for moderate pain.    Past Week  ?? Magnesium Hydroxide (MAGNESIA PO) Take 1 tablet by mouth daily.   Past Month  ?? Multiple Vitamin (MULTIVITAMIN) capsule  Take 1 capsule by mouth daily.   Past Month  ?? Omega-3 Fatty Acids (OMEGA 3 PO) Take 1 tablet by mouth daily.   Past Month  ?? vitamin B-12 (CYANOCOBALAMIN) 1000 MCG tablet Take 1,000 mcg by mouth daily.   Past Month  ?? carbidopa-levodopa (SINEMET IR) 25-250 MG tablet Take 1 tablet by mouth 3 (three) times daily. (Patient not taking: Reported on 01/18/2022) 270 tablet 2 Not Taking  ??  tetrahydrozoline-zinc (VISINE-AC) 0.05-0.25 % ophthalmic solution Place 2 drops into both eyes 4 (four) times daily as needed (allergies).     ?  ? ?Vitals  ? ?Vitals:  ? 01/23/22 1935 01/24/22 8341 01/24/22 9622 01/24/22 2979  ?BP: 115/78 117/76  114/71  ?Pulse: (!) 101 (!) 134 92 (!) 101  ?Resp: '17 16  18  '$ ?Temp: 98.3 ?F (36.8 ?C) (!) 97.5 ?F (36.4 ?C)  97.7 ?F (36.5 ?C)  ?TempSrc: Oral Oral    ?SpO2: 100% 100%  100%  ?Weight:      ?Height:      ?  ? ?Body mass index is 14.99 kg/m?. ? ?Physical Exam  ? ?General: Lying comfortably in bed; in no acute distress.  ?HENT: Normal oropharynx and mucosa. Normal external appearance of ears and nose.  ?Neck: Supple, no pain or tenderness  ?CV: No JVD. No peripheral edema.  ?Pulmonary: Symmetric Chest rise. Normal respiratory effort.  ?Ext: No cyanosis, edema, or deformity  ?Skin: No rash. Normal palpation of skin.   ? ?Neurologic Examination  ?Mental status/Cognition: Alert, oriented to self, place, month and year ?Speech/language: Fluent, comprehension intact, object naming intact, repetition intact. ?Motor:  ?Muscle bulk: markedly decreased ?Significant rigidity and bradykinesia, no apparent tremor ? ? ?Labs  ? ?CBC:  ?Recent Labs  ?Lab 01/22/22 ?0139 01/24/22 ?0411  ?WBC 6.6 10.0  ?HGB 10.1* 10.4*  ?HCT 30.7* 32.4*  ?MCV 90.8 92.0  ?PLT 153 225  ? ? ?Basic Metabolic Panel:  ?Lab Results  ?Component Value Date  ? NA 136 01/24/2022  ? K 4.4 01/24/2022  ? CO2 28 01/24/2022  ? GLUCOSE 121 (H) 01/24/2022  ? BUN 23 01/24/2022  ? CREATININE 0.50 01/24/2022  ? CALCIUM 9.5 01/24/2022  ? GFRNONAA  >60 01/24/2022  ? GFRAA >60 01/31/2020  ? ?Lipid Panel:  ?Lab Results  ?Component Value Date  ? [place] 80 09/25/2021  ? ?HgbA1c:  ?Lab Results  ?Component Value Date  ? HGBA1C 5.4 01/19/2022  ? ?Urine Drug [place]

## 2022-01-25 ENCOUNTER — Inpatient Hospital Stay (HOSPITAL_COMMUNITY): Payer: Medicare HMO

## 2022-01-25 DIAGNOSIS — Z7189 Other specified counseling: Secondary | ICD-10-CM | POA: Diagnosis not present

## 2022-01-25 DIAGNOSIS — R627 Adult failure to thrive: Secondary | ICD-10-CM | POA: Diagnosis not present

## 2022-01-25 DIAGNOSIS — K4130 Unilateral femoral hernia, with obstruction, without gangrene, not specified as recurrent: Secondary | ICD-10-CM | POA: Diagnosis not present

## 2022-01-25 DIAGNOSIS — F419 Anxiety disorder, unspecified: Secondary | ICD-10-CM | POA: Diagnosis not present

## 2022-01-25 LAB — GLUCOSE, CAPILLARY: Glucose-Capillary: 121 mg/dL — ABNORMAL HIGH (ref 70–99)

## 2022-01-25 LAB — BASIC METABOLIC PANEL
Anion gap: 8 (ref 5–15)
BUN: 27 mg/dL — ABNORMAL HIGH (ref 8–23)
CO2: 28 mmol/L (ref 22–32)
Calcium: 9.5 mg/dL (ref 8.9–10.3)
Chloride: 96 mmol/L — ABNORMAL LOW (ref 98–111)
Creatinine, Ser: 0.74 mg/dL (ref 0.44–1.00)
GFR, Estimated: >60 mL/min (ref >60)
Glucose, Bld: 114 mg/dL — ABNORMAL HIGH (ref 70–99)
Potassium: 4.4 mmol/L (ref 3.5–5.1)
Sodium: 132 mmol/L — ABNORMAL LOW (ref 135–145)

## 2022-01-25 MED ORDER — ENSURE ENLIVE PO LIQD
237.0000 mL | Freq: Two times a day (BID) | ORAL | Status: DC
  Administered 2022-01-25 – 2022-01-27 (×6): 237 mL via ORAL

## 2022-01-25 MED ORDER — ENSURE ENLIVE PO LIQD: 237.0000 mL | Freq: Three times a day (TID) | ORAL | Status: DC

## 2022-01-25 MED ORDER — TRAVASOL 10 % IV SOLN
INTRAVENOUS | Status: AC
  Filled 2022-01-25: qty 924

## 2022-01-25 MED ORDER — POLYETHYLENE GLYCOL 3350 17 G PO PACK
17.0000 g | PACK | Freq: Every day | ORAL | Status: DC
  Administered 2022-01-25: 17 g via ORAL
  Filled 2022-01-25 (×2): qty 1

## 2022-01-25 MED ORDER — MIRTAZAPINE 15 MG PO TABS
7.5000 mg | ORAL_TABLET | Freq: Every day | ORAL | Status: DC
  Filled 2022-01-25 (×2): qty 1

## 2022-01-25 MED ORDER — SENNOSIDES-DOCUSATE SODIUM 8.6-50 MG PO TABS
1.0000 | ORAL_TABLET | Freq: Two times a day (BID) | ORAL | Status: DC
  Administered 2022-01-25 (×2): 1 via ORAL
  Filled 2022-01-25 (×4): qty 1

## 2022-01-25 NOTE — Progress Notes (Signed)
?Progress Note ? ?Patient: [name] [name] WCB:[id number] DOB: January 22, 1951  ?DOA: 01/17/2022  DOS: 01/25/2022  ?  ?Brief hospital course: ?[name] [name] is a 71 y.o. female with past medical history of Parkinson's disease and anxiety who presented on 3/23 with abdominal pain, admitted for incarcerated left femoral hernia requiring open repair and resection of necrotic small bowel 3/23. Postoperative ileus is being treated supportively by primary team with hospitalists consulted for medical management. She has declined to take sinemet as prescribed as an outpatient and requests neurology consultation. Will continue PTA sinemet dosing. Hospitalization also complicated by anxiety exacerbated by acute illness for which she and her family request psychiatry consultation. Remeron has been initiated and will be titrated. ? ?Assessment and Plan: ?* Femoral hernia of left side with obstruction ?s/p resection 01/17/2022.  ?- Management per surgery. ? ?Pressure injury of coccygeal region, stage 1 ?POA, offload and optimize nutritional status as much as possible.  ? ?Failure to thrive in adult ?- Severely malnourished baseline clearly predates her acute illness. BMI is < 15.  ?- Palliative is consulted, currently plan is for aggressive measures. Goal is to "get well" back to PLOF. Will pursue SNF placement once no longer TPN-dependent.   ? ?Anxiety ?- Has been on buspar '5mg'$  BID, valium '2mg'$  (though this was prn vertigo).  ?- Appreciate psychiatry evaluations. Initiated low dose remeron in order to address anxiety, insomnia, and to stimulate appetite. The patient has tolerated lowest dose, will increase to still low dose tonight. ? ?Parkinson disease ([place]) ?No alternative to sinemet, must attempt to administer enterally. Neurology consulted, appreciate their involvement in this patient's care. Will follow recommendations. ? ?Subjective: Did not sleep well last night. Tolerated remeron. ? ?Objective: ?Vitals:  ? 01/25/22 0424  01/25/22 0450 01/25/22 0730 01/25/22 1233  ?BP: 119/74  133/85 124/84  ?Pulse: (!) 108  (!) 112 (!) 116  ?Resp: '18  18 16  '$ ?Temp: 98.1 ?F (36.7 ?C)  98.4 ?F (36.9 ?C)   ?TempSrc: Oral  Oral   ?SpO2: 100%  100% 100%  ?Weight:  46.8 kg    ?Height:      ? ?Gen: Pleasant, anxious cachectic female in no distress ?Pulm: Nonlabored breathing room air. Clear. No NGT ?CV: Regular rate and rhythm. No murmur, rub, or gallop. No JVD, no dependent edema. ?GI: Abdomen soft, appropriately mildly tender, non-distended, with normoactive bowel sounds.  ?Ext: Warm, no deformities ?Skin: No new rashes, lesions or ulcers on visualized skin. ?Neuro: Alert and oriented. No focal neurological deficits. ?Psych: Judgement and insight appear fair. Mood euthymic & affect congruent. Behavior is appropriate.   ? ?Data Personally reviewed: ? ?CBC: ?Recent Labs  ?Lab 01/19/22 ?0436 01/20/22 ?0130 01/21/22 ?0505 01/22/22 ?0139 01/24/22 ?0411  ?WBC 10.5 5.2 5.0 6.6 10.0  ?HGB 12.0 11.2* 10.4* 10.1* 10.4*  ?HCT 36.2 33.5* 32.1* 30.7* 32.4*  ?MCV 90.7 90.3 90.4 90.8 92.0  ?PLT 141* 123* 148* 153 225  ? ?Basic Metabolic Panel: ?Recent Labs  ?Lab 01/19/22 ?6160 01/20/22 ?0130 01/21/22 ?0505 01/22/22 ?0139 01/24/22 ?0400 01/25/22 ?0320  ?NA  --  135 137 136 136 132*  ?K  --  3.4* 3.6 3.9 4.4 4.4  ?CL  --  99 100 99 99 96*  ?CO2  --  '29 30 30 28 28  '$ ?GLUCOSE  --  131* 122* 129* 121* 114*  ?BUN  --  '20 16 19 23 '$ 27*  ?CREATININE  --  0.53 0.48 0.51 0.50 0.74  ?CALCIUM  --  9.1  8.9 9.3 9.5 9.5  ?MG 1.9 2.0 1.7 2.0 2.0  --   ?PHOS 2.7 2.2* 2.2* 3.5 3.9  --   ? ?GFR: ?Estimated Creatinine Clearance: 47.7 mL/min (by C-G formula based on SCr of 0.74 mg/dL). ?Liver Function Tests: ?Recent Labs  ?Lab 01/19/22 ?0436 01/20/22 ?0130 01/21/22 ?0505 01/22/22 ?0139 01/24/22 ?0400  ?AST 16 13* 14* 12* 18  ?ALT '7 7 10 9 10  '$ ?ALKPHOS 37* 35* 30* 34* 50  ?BILITOT 1.3* 1.4* 1.0 0.8 0.5  ?PROT 6.0* 5.6* 5.8* 5.8* 6.3*  ?ALBUMIN 3.2* 2.7* 2.6* 2.7* 2.7*  ? ?No results for  input(s): LIPASE, AMYLASE in the last 168 hours. ? ?No results for input(s): AMMONIA in the last 168 hours. ?Coagulation Profile: ?No results for input(s): INR, PROTIME in the last 168 hours. ?Cardiac Enzymes: ?No results for input(s): CKTOTAL, CKMB, CKMBINDEX, TROPONINI in the last 168 hours. ?BNP (last 3 results) ?No results for input(s): PROBNP in the last 8760 hours. ?HbA1C: ?No results for input(s): HGBA1C in the last 72 hours. ?CBG: ?Recent Labs  ?Lab 01/22/22 ?0016 01/22/22 ?0998 01/22/22 ?1216 01/22/22 ?1713 01/25/22 ?1237  ?GLUCAP 126* 124* 128* 130* 121*  ? ?Lipid Profile: ?No results for input(s): CHOL, HDL, LDLCALC, TRIG, CHOLHDL, LDLDIRECT in the last 72 hours. ? ?Thyroid Function Tests: ?No results for input(s): TSH, T4TOTAL, FREET4, T3FREE, THYROIDAB in the last 72 hours. ?Anemia Panel: ?No results for input(s): VITAMINB12, FOLATE, FERRITIN, TIBC, IRON, RETICCTPCT in the last 72 hours. ?Urine analysis: ?   ?Component Value Date/Time  ? COLORURINE YELLOW 01/18/2022 1125  ? APPEARANCEUR CLEAR 01/18/2022 1125  ? LABSPEC >1.046 (H) 01/18/2022 1125  ? PHURINE 5.0 01/18/2022 1125  ? GLUCOSEU NEGATIVE 01/18/2022 1125  ? HGBUR NEGATIVE 01/18/2022 1125  ? [place] NEGATIVE 01/18/2022 1125  ? KETONESUR 20 (A) 01/18/2022 1125  ? PROTEINUR 30 (A) 01/18/2022 1125  ? UROBILINOGEN 0.2 10/02/2012 1223  ? NITRITE NEGATIVE 01/18/2022 1125  ? LEUKOCYTESUR NEGATIVE 01/18/2022 1125  ? ?Recent Results (from the past 240 hour(s))  ?Resp Panel by RT-PCR (Flu A&B, Covid) Nasopharyngeal Swab     Status: None  ? Collection Time: 01/17/22  6:11 PM  ? Specimen: Nasopharyngeal Swab; Nasopharyngeal(NP) swabs in vial transport medium  ?Result Value Ref Range Status  ? SARS Coronavirus 2 by RT PCR NEGATIVE NEGATIVE Final  ?  Comment: (NOTE) ?SARS-CoV-2 target nucleic acids are NOT DETECTED. ? ?The SARS-CoV-2 RNA is generally detectable in upper respiratory ?specimens during the acute phase of infection. The lowest ?concentration  of SARS-CoV-2 viral copies this assay can detect is ?138 copies/mL. A negative result does not preclude SARS-Cov-2 ?infection and should not be used as the sole basis for treatment or ?other patient management decisions. A negative result may occur with  ?improper specimen collection/handling, submission of specimen other ?than nasopharyngeal swab, presence of viral mutation(s) within the ?areas targeted by this assay, and inadequate number of viral ?copies(<138 copies/mL). A negative result must be combined with ?clinical observations, patient history, and epidemiological ?information. The expected result is Negative. ? ?Fact Sheet for Patients:  ?EntrepreneurPulse.com.au ? ?Fact Sheet for Healthcare Providers:  ?IncredibleEmployment.be ? ?This test is no t yet approved or cleared by the [place] FDA and  ?has been authorized for detection and/or diagnosis of SARS-CoV-2 by ?FDA under an Emergency Use Authorization (EUA). This EUA will remain  ?in effect (meaning this test can be used) for the duration of the ?COVID-19 declaration under Section 564(b)(1) of the Act, 21 ?U.S.C.section 360bbb-3(b)(1), unless the  authorization is terminated  ?or revoked sooner.  ? ? ?  ? Influenza A by PCR NEGATIVE NEGATIVE Final  ? Influenza B by PCR NEGATIVE NEGATIVE Final  ?  Comment: (NOTE) ?The Xpert Xpress SARS-CoV-2/FLU/RSV plus assay is intended as an aid ?in the diagnosis of influenza from Nasopharyngeal swab specimens and ?should not be used as a sole basis for treatment. Nasal washings and ?aspirates are unacceptable for Xpert Xpress SARS-CoV-2/FLU/RSV ?testing. ? ?Fact Sheet for Patients: ?EntrepreneurPulse.com.au ? ?Fact Sheet for Healthcare Providers: ?IncredibleEmployment.be ? ?This test is not yet approved or cleared by the [place] FDA and ?has been authorized for detection and/or diagnosis of SARS-CoV-2 by ?FDA under an Emergency Use  Authorization (EUA). This EUA will remain ?in effect (meaning this test can be used) for the duration of the ?COVID-19 declaration under Section 564(b)(1) of the Act, 21 U.S.C. ?section 360bbb-3(b)(1), unless the auth

## 2022-01-25 NOTE — Progress Notes (Signed)
8 Days Post-Op  ? ?Subjective/Chief Complaint: ?Overall feels better. Reports one episode of emesis yesterday. Small amt flatus but not much. Denies BM and reports baseline constipation prior to surgery. ? ?Objective: ?Vital signs in last 24 hours: ?Temp:  [97.4 ?F (36.3 ?C)-98.4 ?F (36.9 ?C)] 98.4 ?F (36.9 ?C) (03/31 0730) ?Pulse Rate:  [103-112] 112 (03/31 0730) ?Resp:  [16-18] 18 (03/31 0730) ?BP: (119-133)/(74-89) 133/85 (03/31 0730) ?SpO2:  [100 %] 100 % (03/31 0730) ?Weight:  [46.8 kg] 46.8 kg (03/31 0450) ?Last BM Date : 01/24/22 ? ?Intake/Output from previous day: ?03/30 0701 - 03/31 0700 ?In: 2458.3 [I.V.:2458.3] ?Out: -  ?Intake/Output this shift: ?No intake/output data recorded. ? ?PE: ?Gen: Frail appearing female in NAD ?Abd - mild distention stable compared to yesterday, L femoral hernia incision c/d/I, +BS  ? ?Lab Results:  ?Recent Labs  ?  01/24/22 ?0411  ?WBC 10.0  ?HGB 10.4*  ?HCT 32.4*  ?PLT 225  ? ?BMET ?Recent Labs  ?  01/24/22 ?0400 01/25/22 ?0320  ?NA 136 132*  ?K 4.4 4.4  ?CL 99 96*  ?CO2 28 28  ?GLUCOSE 121* 114*  ?BUN 23 27*  ?CREATININE 0.50 0.74  ?CALCIUM 9.5 9.5  ? ?PT/INR ?No results for input(s): LABPROT, INR in the last 72 hours. ?ABG ?No results for input(s): PHART, HCO3 in the last 72 hours. ? ?Invalid input(s): PCO2, PO2 ? ?Studies/Results: ?DG Abd Portable 1V ? ?Result Date: 01/25/2022 ?CLINICAL DATA:  Vomiting. EXAM: PORTABLE ABDOMEN - 1 VIEW COMPARISON:  January 21, 2022. FINDINGS: Grossly stable small bowel dilatation is noted concerning for ileus or possibly distal small bowel obstruction. No colonic dilatation is noted. IMPRESSION: Grossly stable small bowel dilatation is noted concerning for ileus or possible distal small bowel obstruction. Electronically Signed   By: Marijo Conception M.D.   On: 01/25/2022 09:21   ? ?Anti-infectives: ?Anti-infectives (From admission, onward)  ? ? None  ? ?  ? ? ?Assessment/Plan: ?POD 4,S/p emergent left femoral hernia repair with Phasix mesh/  small bowel resection 01/17/22 - Dr. Kieth Brightly for incarcerated left femoral hernia  ?- post-op ileus -Awaiting further return of bowel function; having flatus but no BM  ?- allow FLD/ensure, SLP following; would back off to NPO, sips with meds, if vomits again. Continue TPN. ?- start stool softeners/miralax ?-Protein-calorie malnutrition - severe, on TPN ?-PT/OT  ? ?FEN: FLD, ensure, TPN ?ID: None ?VTE: SCDs, Lovenox ?Foley: None ?Dispo: Continued care on floor, awaiting return of bowel function ? ?Parkinson's disease - per medicine ?FTT in adult ? ? LOS: 8 days  ? ? ?[place] ?01/25/2022 ? ?

## 2022-01-25 NOTE — TOC Progression Note (Signed)
Transition of Care (TOC) - Progression Note  ? ? ?Patient Details  ?Name: [name] [name] ?MRN: [id number] ?Date of Birth: 01/15/1951 ? ?Transition of Care (TOC) CM/SW Contact  ?Coralee Pesa, LCSWA ?Phone Number: ?01/25/2022, 10:28 AM ? ?Clinical Narrative:    ? ?CSW advised pt may be ready to DC to [facility] Monday, as soon as TPN can be discontinued. Pt is currently not managed by [facility] started auth. Pending authorization # is [id number]. TOC will continue to follow for DC needs. ? ?Expected Discharge Plan: [place] ?Barriers to Discharge: Continued Medical Work up, SNF Pending bed offer ? ?Expected Discharge Plan and Services ?Expected Discharge Plan: [place] ?In-house Referral: Clinical Social Work ?  ?Post Acute Care Choice: [place] ?Living arrangements for the past 2 months: [place] ?                ?  ?  ?  ?  ?  ?  ?  ?  ?  ?  ? ? ?Social Determinants of Health (SDOH) Interventions ?  ? ?Readmission Risk Interventions ?   ? View : No data to display.  ?  ?  ?  ? ? ?

## 2022-01-25 NOTE — Progress Notes (Signed)
Patient has not slept well. Daughter states that she has taken about 4-5 small naps for about 15 minutes at at time. Patient is refusing to take Seroquel for sleep,but will allow a stronger dose of Remeron to be given. Patients daughter has also request for Miralax to be ordered.Per daughter patient was straining to have a bowel movement yesterday.  ?

## 2022-01-25 NOTE — Care Management Important Message (Signed)
Important Message ? ?Patient Details  ?Name: [name] [name] ?MRN: [id number] ?Date of Birth: [birth year] ? ? ?Medicare Important Message Given:  Yes ? ? ? ? ?Levada Dy  Khyler Eschmann-Martin ?01/25/2022, 1:35 PM ?

## 2022-01-25 NOTE — Progress Notes (Signed)
Physical Therapy Treatment ?Patient Details ?Name: [name] [name] ?MRN: [id number] ?DOB: [birth year] ?Today's Date: 01/25/2022 ? ? ?History of Present Illness 71 yo female with 1 day of worsening abdominal pain and vomiting, s/p femoral hernia repair with SBR 2/2 gangrene on 3/23. Pt with NGT to suction. Noted poor po intake for about 6 months due to living situation. Daughter reports pt  with wt loss of 20-30# within past few months. Significant PMH: Parkinson disease, anxiety ? ?  ?PT Comments  ? ? Pt OOB in recliner on arrival and agreeable to session requiring mod assist to power up to standing and demonstrating better standing balance this session. Pt with less tendency for posterior lean and able to shift weight through toes to maintain static standing for increased time. Pt unable to progress ambulation or standing marching as pt stating she had just had her PD meds and she feels more rigid and like her feet are stuck to the floor. Pt with continued good tolerance for LE therex for increased ROM and strength.  Current plan remains appropriate to address deficits and maximize functional independence and decrease caregiver burden. Pt continues to benefit from skilled PT services to progress toward functional mobility goals.  ?  ?Recommendations for follow up therapy are one component of a multi-disciplinary discharge planning process, led by the attending physician.  Recommendations may be updated based on patient status, additional functional criteria and insurance authorization. ? ?Follow Up Recommendations ? Skilled nursing-short term rehab (<3 hours/day) ?  ?  ?Assistance Recommended at Discharge Frequent or constant Supervision/Assistance  ?Patient can return home with the following A lot of help with walking and/or transfers;A lot of help with bathing/dressing/bathroom ?  ?Equipment Recommendations ? Wheelchair cushion (measurements PT);Wheelchair (measurements PT)  ?  ?Recommendations for Other Services    ? ? ?  ?Precautions / Restrictions Precautions ?Precautions: Fall ?Precaution Comments: watch HR ?Restrictions ?Weight Bearing Restrictions: No  ?  ? ?Mobility ? Bed Mobility ?  ?  ?  ?  ?  ?  ?  ?General bed mobility comments: OOB in recliner on arrival ?  ? ?Transfers ?Overall transfer level: Needs assistance ?Equipment used: Rolling walker (2 wheels) ?Transfers: Sit to/from Stand ?Sit to Stand: Mod assist ?  ?  ?  ?  ?  ?General transfer comment: mod assist to power up and facilitate anterior weight shift, pt with posterior lean better standing balance this session ?  ? ?Ambulation/Gait ?  ?  ?  ?  ?  ?  ?  ?General Gait Details: unable, stating she just had her PD meds and she feels like her feet are stuck to floor and overall feeling more rigid ? ? ?Stairs ?  ?  ?  ?  ?  ? ? ?Wheelchair Mobility ?  ? ?Modified Rankin (Stroke Patients Only) ?  ? ? ?  ?Balance Overall balance assessment: Needs assistance ?Sitting-balance support: Bilateral upper extremity supported, Feet supported ?Sitting balance-Leahy Scale: Poor ?Sitting balance - Comments: posterior, left lateral lean ?  ?Standing balance support: During functional activity ?Standing balance-Leahy Scale: Poor ?Standing balance comment: reliance on BUE support on RW ?  ?  ?  ?  ?  ?  ?  ?  ?  ?  ?  ?  ? ?  ?Cognition Arousal/Alertness: Awake/alert ?Behavior During Therapy: [facility] for tasks assessed/performed ?Overall Cognitive Status: Within Functional Limits for tasks assessed ?  ?  ?  ?  ?  ?  ?  ?  ?  ?  ?  ?  ?  ?  ?  ?  ?  ?  ?  ? ?  ?  Exercises General Exercises - Lower Extremity ?Ankle Circles/Pumps: AROM, Both, 20 reps, Seated ?Long Arc Quad: AROM, Right, Left, Seated, 20 reps ?Hip ABduction/ADduction: AROM, Both, 10 reps, Seated ?Hip Flexion/Marching: AROM, Right, Left, 20 reps, Seated ?Toe Raises: AROM, Right, Left, 10 reps, Seated ?Heel Raises: AROM, Right, Left, 10 reps, Seated ?Other Exercises ?Other Exercises: pillow squeeze x10 ?Other Exercises:  seated reaching outside BOS x10 ea side, standing reaching outside BOS x5 ea side, seated anterior weight shifting/rocking x20 with push on PTA arms ? ?  ?General Comments   ?  ?  ? ?Pertinent Vitals/Pain Pain Assessment ?Pain Assessment: Faces ?Faces Pain Scale: Hurts a little bit ?Pain Location: stomach ?Pain Descriptors / Indicators: Pressure, Other (Comment) (bloating) ?Pain Intervention(s): Limited activity within patient's tolerance, Monitored during session  ? ? ?Home Living   ?  ?  ?  ?  ?  ?  ?  ?  ?  ?   ?  ?Prior Function    ?  ?  ?   ? ?PT Goals (current goals can now be found in the care plan section) Acute Rehab PT Goals ?PT Goal Formulation: With patient/family ?Time For Goal Achievement: 02/02/22 ? ?  ?Frequency ? ? ? Min 3X/week ? ? ? ?  ?PT Plan    ? ? ?Co-evaluation   ?  ?  ?  ?  ? ?  ?AM-PAC PT "6 Clicks" Mobility   ?Outcome Measure ? Help needed turning from your back to your side while in a flat bed without using bedrails?: A Lot ?Help needed moving from lying on your back to sitting on the side of a flat bed without using bedrails?: A Lot ?Help needed moving to and from a bed to a chair (including a wheelchair)?: A Lot ?Help needed standing up from a chair using your arms (e.g., wheelchair or bedside chair)?: A Lot ?Help needed to walk in hospital room?: Total ?Help needed climbing 3-5 steps with a railing? : Total ?6 Click Score: 10 ? ?  ?End of Session Equipment Utilized During Treatment: Gait belt ?Activity Tolerance: Patient limited by fatigue ?Patient left: in chair;with call bell/phone within reach;with family/visitor present ?Nurse Communication: Mobility status ?PT Visit Diagnosis: Muscle weakness (generalized) (M62.81);Difficulty in walking, not elsewhere classified (R26.2) ?  ? ? ?Time: 5701-7793 ?PT Time Calculation (min) (ACUTE ONLY): 28 min ? ?Charges:  $Therapeutic Exercise: 23-37 mins          ?          ? ?Audry Riles. PTA ?Acute Rehabilitation Services ?Office:  [phone] ? ? ? ?Betsey Holiday Kadyn Guild ?01/25/2022, 11:42 AM ? ?

## 2022-01-25 NOTE — Care Management Important Message (Signed)
Important Message ? ?Patient Details  ?Name: [name] [name] ?MRN: [id number] ?Date of Birth: 04/29/1951 ? ? ?Medicare Important Message Given:  Yes ? ? ? ? ?Levada Dy  Staci Dack-Martin ?01/25/2022, 1:33 PM ?

## 2022-01-25 NOTE — Progress Notes (Signed)
Occupational Therapy Treatment ?Patient Details ?Name: [name] [name] ?MRN: [id number] ?DOB: September 06, 1951 ?Today's Date: 01/25/2022 ? ? ?History of present illness 71 yo female with 1 day of worsening abdominal pain and vomiting, s/p femoral hernia repair with SBR 2/2 gangrene on 3/23. Pt with NGT to suction. Noted poor po intake for about 6 months due to living situation. Daughter reports pt  with wt loss of 20-30# within past few months. Significant PMH: Parkinson disease, anxiety ?  ?OT comments ? Patient up in recliner with daughter attempting to feed self jello and pudding but had difficulty holding onto utensil.  Red foam added to handle and patient was able to bring food to mouth with support at elbow. Patient informed therapist she was beginning to not feel well and asked to return back to bed after meal. Vital signs were checked and blood sugar were within normal ranges.  Acute OT to continue to follow.   ? ?Recommendations for follow up therapy are one component of a multi-disciplinary discharge planning process, led by the attending physician.  Recommendations may be updated based on patient status, additional functional criteria and insurance authorization. ?   ?Follow Up Recommendations ? Skilled nursing-short term rehab (<3 hours/day)  ?  ?Assistance Recommended at Discharge Frequent or constant Supervision/Assistance  ?Patient can return home with the following ? A lot of help with bathing/dressing/bathroom;A lot of help with walking and/or transfers ?  ?Equipment Recommendations ? Other (comment) (TBD)  ?  ?Recommendations for Other Services   ? ?  ?Precautions / Restrictions Precautions ?Precautions: Fall ?Precaution Comments: watch HR ?Restrictions ?Weight Bearing Restrictions: No  ? ? ?  ? ?Mobility Bed Mobility ?Overal bed mobility: Needs Assistance ?Bed Mobility: Sit to Supine ?  ?  ?  ?Sit to supine: Mod assist ?  ?General bed mobility comments: OOB in recliner and assisted back to bed after  patient began stating she wasn't feeling well ?  ? ?Transfers ?Overall transfer level: Needs assistance ?Equipment used: None ?Transfers: Sit to/from Stand, Bed to chair/wheelchair/BSC ?Sit to Stand: Mod assist ?Stand pivot transfers: Mod assist ?  ?  ?  ?  ?General transfer comment: stand pivot transfer back to bed due to patient not feeling well at time of transfer ?  ?  ?Balance Overall balance assessment: Needs assistance ?Sitting-balance support: Bilateral upper extremity supported, Feet supported ?Sitting balance-Leahy Scale: Poor ?Sitting balance - Comments: able to lean forward in chair when feeding self ?  ?  ?  ?  ?  ?  ?  ?  ?  ?  ?  ?  ?  ?  ?  ?   ? ?ADL either performed or assessed with clinical judgement  ? ?ADL Overall ADL's : Needs assistance/impaired ?Eating/Feeding: Minimal assistance;With adaptive utensils;Sitting ?Eating/Feeding Details (indicate cue type and reason): required support at elbow and built up utensil ?Grooming: Dance movement psychotherapist;Set up ?Grooming Details (indicate cue type and reason): able to wipe face after meal ?  ?  ?  ?  ?  ?  ?  ?  ?  ?  ?  ?  ?  ?  ?  ?General ADL Comments: performed self feeding with min assist before patient began stating she felt ill ?  ? ?Extremity/Trunk Assessment   ?  ?  ?  ?  ?  ? ?Vision   ?  ?  ?Perception   ?  ?Praxis   ?  ? ?Cognition Arousal/Alertness: Awake/alert ?Behavior During Therapy: [facility] for tasks assessed/performed ?  Overall Cognitive Status: Within Functional Limits for tasks assessed ?  ?  ?  ?  ?  ?  ?  ?  ?  ?  ?  ?  ?  ?  ?  ?  ?  ?  ?  ?   ?Exercises   ? ?  ?Shoulder Instructions   ? ? ?  ?General Comments Patient stated she was feeilng ill at end of session and was assisted back to bed.  Patients vital signs were normal  ? ? ?Pertinent Vitals/ Pain       Pain Assessment ?Pain Assessment: Faces ?Faces Pain Scale: Hurts a little bit ?Pain Location: stomach ?Pain Descriptors / Indicators: Pressure ?Pain Intervention(s): Limited activity  within patient's tolerance, Monitored during session ? ?Home Living   ?  ?  ?  ?  ?  ?  ?  ?  ?  ?  ?  ?  ?  ?  ?  ?  ?  ?  ? ?  ?Prior Functioning/Environment    ?  ?  ?  ?   ? ?Frequency ? Min 2X/week  ? ? ? ? ?  ?Progress Toward Goals ? ?OT Goals(current goals can now be found in the care plan section) ? Progress towards OT goals: Progressing toward goals ? ?Acute Rehab OT Goals ?Patient Stated Goal: get better ?OT Goal Formulation: With patient/family ?Time For Goal Achievement: 02/02/22 ?Potential to Achieve Goals: Good ?ADL Goals ?Pt Will Perform Grooming: with min guard assist;sitting ?Pt Will Perform Upper Body Bathing: with min assist;with min guard assist;sitting ?Pt Will Perform Lower Body Bathing: with mod assist;sitting/lateral leans ?Pt Will Perform Upper Body Dressing: with min assist;with min guard assist;sitting ?Pt Will Transfer to Toilet: with min assist;stand pivot transfer;bedside commode ?Pt Will Perform Toileting - Clothing Manipulation and hygiene: with max assist;with mod assist;sit to/from stand  ?Plan Discharge plan remains appropriate   ? ?Co-evaluation ? ? ?   ?  ?  ?  ?  ? ?  ?AM-PAC OT "6 Clicks" Daily Activity     ?Outcome Measure ? ? Help from another person eating meals?: A Little ?  ?Help from another person toileting, which includes using toliet, bedpan, or urinal?: Total ?Help from another person bathing (including washing, rinsing, drying)?: A Lot ?Help from another person to put on and taking off regular upper body clothing?: A Little ?Help from another person to put on and taking off regular lower body clothing?: A Lot ?6 Click Score: 11 ? ?  ?End of Session   ? ?OT Visit Diagnosis: Unsteadiness on feet (R26.81);Muscle weakness (generalized) (M62.81);Other abnormalities of gait and mobility (R26.89) ?  ?Activity Tolerance Other (comment) (patient began feeling ill at end of treatment and asked to return to supine) ?  ?Patient Left in bed;with call bell/phone within reach;with  family/visitor present ?  ?Nurse Communication Mobility status ?  ? ?   ? ?Time: 3335-4562 ?OT Time Calculation (min): 32 min ? ?Charges: OT General Charges ?$OT Visit: 1 Visit ?OT Treatments ?$Self Care/Home Management : 23-37 mins ? ?Lodema Hong, OTA ?Acute Rehabilitation Services  ?Pager [phone] ?Office [phone] ? ? ?[place] ?01/25/2022, 1:41 PM ?

## 2022-01-25 NOTE — Consult Note (Signed)
Brief Psychiatric Consult Note ? ?Saw patient today and her daughter and son where present in the room. She reports that she did not sleep well last night.  She reports that she did not have any side effects from starting Remeron yesterday.  Discussed with her that her dose was unlikely to have a positive impact but we wanted to ensure she would not have a negative side effect which she has reported to many medications.  Discussed that we would increase her Remeron tonight and she was agreeable to this.  Discussed with her that she will still have PRN Seroquel as needed. ? ?She reports no SI, HI, or AVH.  ? ? ?-Increase Remeron to 7.5 mg QHS for anxiety, sleep, and appetite stimulation ?-Continue Seroquel 25 mg PRN QHS  ? ? ?We will continue to follow the patient. ? ? ? ?Fatima Sanger MD ?Resident ? ?

## 2022-01-25 NOTE — Progress Notes (Signed)
PHARMACY - TOTAL PARENTERAL NUTRITION CONSULT NOTE  ? ?Indication: Prolonged ileus ? ?Patient Measurements: ?Height: '5\' 7"'$  (170.2 cm) ?Weight: 46.8 kg (103 lb 2.8 oz) ?IBW/kg (Calculated) : 61.6 ?TPN AdjBW (KG): 43.4 ?Body mass index is 16.16 kg/m?. ?Usual Weight: ~115 lbs ? ?Assessment:  ?71 y.o. F s/p femoral hernia repair with SBR 2/2 gangrene on 3/23. Pt with NGT to suction. Noted poor po intake for about 6 months due to living situation. Daughter reports pt UBW ~115 lbs with wt loss of 20-30 lbs within past few months. No flatus or BM. Pharmacy consulted to start TPN for ileus in pt with severe PCM upon admission. ? ?Glucose / Insulin: BGs < 140 - requiring no insulin.  ?Electrolytes: Na trend down 132, coCa up at 10.5, others wnl   ?Renal: SCr stable ?Hepatic: alb 2.7, LFTs/T.bili wnl , TG 46 ?Intake / Output; MIVF: UOP unmeasured; + flatus, last stool charted 3/27 but very small and liquid only - NGT removed 3/29. ?Current MIVF: D51/2NS at 10 ml/hr ?GI Imaging: ?3/23 Incarcerated left inguinal small-bowel hernia with high-grade small bowel obstruction. ?3/27 KUB - suggestive of postoperative ileus ?GI Surgeries / Procedures:  ?3/23 femoral hernia repair with SBR ? ?Central access: PICC placed 3/25  ?TPN start date: 01/19/22 ? ?Nutritional Goals: ?Goal TPN rate is 70 mL/hr (provides 92 g of protein and 1644 kcals per day) ? ?RD Assessment: ?Estimated Needs ?Total Energy Estimated Needs: 1600 - 1800 ?Total Protein Estimated Needs: 80 - 95 grams ?Total Fluid Estimated Needs: >/= 1.6 L ? ?Current Nutrition:  ?Clear liquids - started 3/30 ?TPN at 70 ml/hr - providing 100% of needs ? ?Plan:  ?Continue TPN at goal rate of 70 mL/hr at 1800  ?Electrolytes in TPN:  Na 86 mEq/L, K 60 mEq/L, Mag 7 mEq/L,  Phos to 20 mmol/L, Ca 4.5 mEq/L, and Cl:Ac 1:1 ?Continue standard MVI and trace elements to TPN ?Continue thiamine '100mg'$  daily x 5 days (3/25>>3/29) ?Monitor TPN labs daily until stable then on Mon/Thurs ?Monitor oral  intake/clears  ?Follow-up for progress - consider cycling if BG will allow ? ?Sloan Leiter, PharmD ?Clinical Pharmacist ?Please refer to [facility] for [place] numbers ?01/25/2022 7:50 AM  ? ? ?

## 2022-01-26 ENCOUNTER — Inpatient Hospital Stay (HOSPITAL_COMMUNITY): Payer: Medicare HMO

## 2022-01-26 DIAGNOSIS — K4130 Unilateral femoral hernia, with obstruction, without gangrene, not specified as recurrent: Secondary | ICD-10-CM | POA: Diagnosis not present

## 2022-01-26 DIAGNOSIS — Z7189 Other specified counseling: Secondary | ICD-10-CM | POA: Diagnosis not present

## 2022-01-26 DIAGNOSIS — K460 Unspecified abdominal hernia with obstruction, without gangrene: Secondary | ICD-10-CM

## 2022-01-26 DIAGNOSIS — R627 Adult failure to thrive: Secondary | ICD-10-CM | POA: Diagnosis not present

## 2022-01-26 DIAGNOSIS — G20 Parkinson's disease: Secondary | ICD-10-CM | POA: Diagnosis not present

## 2022-01-26 DIAGNOSIS — F419 Anxiety disorder, unspecified: Secondary | ICD-10-CM | POA: Diagnosis not present

## 2022-01-26 LAB — POCT I-STAT 7, (LYTES, BLD GAS, ICA,H+H)
Acid-base deficit: 5 mmol/L — ABNORMAL HIGH (ref 0.0–2.0)
Bicarbonate: 20.6 mmol/L (ref 20.0–28.0)
Calcium, Ion: 1.24 mmol/L (ref 1.15–1.40)
HCT: 31 % — ABNORMAL LOW (ref 36.0–46.0)
Hemoglobin: 10.5 g/dL — ABNORMAL LOW (ref 12.0–15.0)
O2 Saturation: 88 %
Patient temperature: 101.3
Potassium: 4.5 mmol/L (ref 3.5–5.1)
Sodium: 134 mmol/L — ABNORMAL LOW (ref 135–145)
TCO2: 22 mmol/L (ref 22–32)
pCO2 arterial: 40.9 mmHg (ref 32–48)
pH, Arterial: 7.316 — ABNORMAL LOW (ref 7.35–7.45)
pO2, Arterial: 64 mmHg — ABNORMAL LOW (ref 83–108)

## 2022-01-26 LAB — CBC
HCT: 33.6 % — ABNORMAL LOW (ref 36.0–46.0)
Hemoglobin: 11 g/dL — ABNORMAL LOW (ref 12.0–15.0)
MCH: 30.4 pg (ref 26.0–34.0)
MCHC: 32.7 g/dL (ref 30.0–36.0)
MCV: 92.8 fL (ref 80.0–100.0)
Platelets: 205 10*3/uL (ref 150–400)
RBC: 3.62 MIL/uL — ABNORMAL LOW (ref 3.87–5.11)
RDW: 13.5 % (ref 11.5–15.5)
WBC: 29 10*3/uL — ABNORMAL HIGH (ref 4.0–10.5)
nRBC: 0.1 % (ref 0.0–0.2)

## 2022-01-26 LAB — BASIC METABOLIC PANEL
Anion gap: 8 (ref 5–15)
BUN: 31 mg/dL — ABNORMAL HIGH (ref 8–23)
CO2: 20 mmol/L — ABNORMAL LOW (ref 22–32)
Calcium: 8 mg/dL — ABNORMAL LOW (ref 8.9–10.3)
Chloride: 106 mmol/L (ref 98–111)
Creatinine, Ser: 0.73 mg/dL (ref 0.44–1.00)
GFR, Estimated: >60 mL/min (ref >60)
Glucose, Bld: 115 mg/dL — ABNORMAL HIGH (ref 70–99)
Potassium: 4.5 mmol/L (ref 3.5–5.1)
Sodium: 134 mmol/L — ABNORMAL LOW (ref 135–145)

## 2022-01-26 LAB — LACTIC ACID, PLASMA: Lactic Acid, Venous: 3.6 mmol/L (ref 0.5–1.9)

## 2022-01-26 LAB — GLUCOSE, CAPILLARY
Glucose-Capillary: 109 mg/dL — ABNORMAL HIGH (ref 70–99)
Glucose-Capillary: 117 mg/dL — ABNORMAL HIGH (ref 70–99)
Glucose-Capillary: 123 mg/dL — ABNORMAL HIGH (ref 70–99)
Glucose-Capillary: 127 mg/dL — ABNORMAL HIGH (ref 70–99)
Glucose-Capillary: 129 mg/dL — ABNORMAL HIGH (ref 70–99)
Glucose-Capillary: 134 mg/dL — ABNORMAL HIGH (ref 70–99)

## 2022-01-26 LAB — TROPONIN I (HIGH SENSITIVITY): Troponin I (High Sensitivity): 152 ng/L (ref <18)

## 2022-01-26 MED ORDER — ACETAMINOPHEN 325 MG PO TABS
650.0000 mg | ORAL_TABLET | Freq: Four times a day (QID) | ORAL | Status: DC | PRN
Start: 2022-01-26 — End: 2022-02-04
  Administered 2022-01-29 – 2022-02-03 (×5): 650 mg
  Filled 2022-01-26 (×7): qty 2

## 2022-01-26 MED ORDER — MIDAZOLAM HCL 2 MG/2ML IJ SOLN
1.0000 mg | INTRAMUSCULAR | Status: DC | PRN
  Administered 2022-01-26: 1 mg via INTRAVENOUS
  Filled 2022-01-26 (×2): qty 2

## 2022-01-26 MED ORDER — FENTANYL CITRATE (PF) 100 MCG/2ML IJ SOLN: 25.0000 ug | Freq: Once | INTRAMUSCULAR | Status: DC

## 2022-01-26 MED ORDER — SUCCINYLCHOLINE CHLORIDE 200 MG/10ML IV SOSY
PREFILLED_SYRINGE | INTRAVENOUS | Status: AC
  Filled 2022-01-26: qty 10

## 2022-01-26 MED ORDER — PHENYLEPHRINE 40 MCG/ML (10ML) SYRINGE FOR IV PUSH (FOR BLOOD PRESSURE SUPPORT)
PREFILLED_SYRINGE | INTRAVENOUS | Status: AC
Start: 2022-01-26 — End: 2022-01-26
  Filled 2022-01-26: qty 10

## 2022-01-26 MED ORDER — CARBIDOPA-LEVODOPA 25-250 MG PO TABS
1.0000 | ORAL_TABLET | Freq: Three times a day (TID) | ORAL | Status: DC
  Administered 2022-01-26 – 2022-02-05 (×26): 1
  Filled 2022-01-26 (×32): qty 1

## 2022-01-26 MED ORDER — MIDAZOLAM HCL 2 MG/2ML IJ SOLN
INTRAMUSCULAR | Status: AC
  Administered 2022-01-26: 1 mg via INTRAVENOUS
  Filled 2022-01-26: qty 2

## 2022-01-26 MED ORDER — FENTANYL CITRATE (PF) 100 MCG/2ML IJ SOLN
INTRAMUSCULAR | Status: AC
  Administered 2022-01-26: 100 ug via INTRAVENOUS
  Filled 2022-01-26: qty 2

## 2022-01-26 MED ORDER — SIMETHICONE 80 MG PO CHEW
80.0000 mg | CHEWABLE_TABLET | Freq: Four times a day (QID) | ORAL | Status: DC | PRN
  Filled 2022-01-26: qty 1

## 2022-01-26 MED ORDER — PHENYLEPHRINE 40 MCG/ML (10ML) SYRINGE FOR IV PUSH (FOR BLOOD PRESSURE SUPPORT): 80.0000 ug | PREFILLED_SYRINGE | INTRAVENOUS | Status: DC

## 2022-01-26 MED ORDER — DOCUSATE SODIUM 50 MG/5ML PO LIQD
100.0000 mg | Freq: Two times a day (BID) | ORAL | Status: DC
  Administered 2022-01-26 – 2022-02-04 (×12): 100 mg
  Filled 2022-01-26 (×14): qty 10

## 2022-01-26 MED ORDER — SENNOSIDES-DOCUSATE SODIUM 8.6-50 MG PO TABS
1.0000 | ORAL_TABLET | Freq: Two times a day (BID) | ORAL | Status: DC
  Administered 2022-01-26 – 2022-02-04 (×13): 1
  Filled 2022-01-26 (×13): qty 1

## 2022-01-26 MED ORDER — PHENYLEPHRINE HCL-NACL 20-0.9 MG/250ML-% IV SOLN
25.0000 ug/min | INTRAVENOUS | Status: DC
  Administered 2022-01-26: 25 ug/min via INTRAVENOUS
  Administered 2022-01-27: 90 ug/min via INTRAVENOUS
  Administered 2022-01-27: 40 ug/min via INTRAVENOUS
  Administered 2022-01-27 – 2022-01-28 (×4): 100 ug/min via INTRAVENOUS
  Administered 2022-01-28: 80 ug/min via INTRAVENOUS
  Filled 2022-01-26 (×9): qty 250

## 2022-01-26 MED ORDER — DOCUSATE SODIUM 50 MG/5ML PO LIQD: 100.0000 mg | Freq: Two times a day (BID) | ORAL | Status: DC

## 2022-01-26 MED ORDER — LACTATED RINGERS IV BOLUS
1000.0000 mL | Freq: Once | INTRAVENOUS | Status: AC
  Administered 2022-01-26: 1000 mL via INTRAVENOUS

## 2022-01-26 MED ORDER — FENTANYL BOLUS VIA INFUSION
25.0000 ug | INTRAVENOUS | Status: DC | PRN
  Administered 2022-01-29: 25 ug via INTRAVENOUS
  Filled 2022-01-26: qty 100

## 2022-01-26 MED ORDER — FENTANYL CITRATE (PF) 100 MCG/2ML IJ SOLN: 100.0000 ug | Freq: Once | INTRAMUSCULAR | Status: AC

## 2022-01-26 MED ORDER — PHENYLEPHRINE HCL-NACL 20-0.9 MG/250ML-% IV SOLN
INTRAVENOUS | Status: AC
  Filled 2022-01-26: qty 250

## 2022-01-26 MED ORDER — CHLORHEXIDINE GLUCONATE CLOTH 2 % EX PADS
6.0000 | MEDICATED_PAD | Freq: Every day | CUTANEOUS | Status: DC
  Administered 2022-01-26 – 2022-01-27 (×2): 6 via TOPICAL

## 2022-01-26 MED ORDER — ETOMIDATE 2 MG/ML IV SOLN
20.0000 mg | Freq: Once | INTRAVENOUS | Status: AC
Start: 2022-01-26 — End: 2022-01-26

## 2022-01-26 MED ORDER — KETAMINE HCL 50 MG/5ML IJ SOSY
PREFILLED_SYRINGE | INTRAMUSCULAR | Status: AC
  Filled 2022-01-26: qty 5

## 2022-01-26 MED ORDER — MIDAZOLAM HCL 2 MG/2ML IJ SOLN
1.0000 mg | INTRAMUSCULAR | Status: DC | PRN
  Administered 2022-01-26 – 2022-01-27 (×2): 1 mg via INTRAVENOUS
  Filled 2022-01-26 (×2): qty 2

## 2022-01-26 MED ORDER — OXYCODONE HCL 5 MG PO TABS
5.0000 mg | ORAL_TABLET | Freq: Four times a day (QID) | ORAL | Status: DC | PRN
  Administered 2022-02-01: 5 mg
  Filled 2022-01-26: qty 1

## 2022-01-26 MED ORDER — METOPROLOL TARTRATE 5 MG/5ML IV SOLN: 5.0000 mg | Freq: Once | INTRAVENOUS | Status: DC

## 2022-01-26 MED ORDER — POLYETHYLENE GLYCOL 3350 17 G PO PACK
17.0000 g | PACK | Freq: Every day | ORAL | Status: DC
  Administered 2022-01-28 – 2022-01-29 (×2): 17 g
  Filled 2022-01-26: qty 1

## 2022-01-26 MED ORDER — POLYETHYLENE GLYCOL 3350 17 G PO PACK: 17.0000 g | PACK | Freq: Every day | ORAL | Status: DC

## 2022-01-26 MED ORDER — SODIUM CHLORIDE 0.9 % IV SOLN: 250.0000 mL | INTRAVENOUS | Status: DC

## 2022-01-26 MED ORDER — FENTANYL CITRATE (PF) 100 MCG/2ML IJ SOLN
25.0000 ug | INTRAMUSCULAR | Status: DC | PRN
  Administered 2022-01-26 (×2): 25 ug via INTRAVENOUS
  Filled 2022-01-26 (×2): qty 2

## 2022-01-26 MED ORDER — METHOCARBAMOL 500 MG PO TABS
500.0000 mg | ORAL_TABLET | Freq: Four times a day (QID) | ORAL | Status: DC | PRN
  Administered 2022-02-04 – 2022-02-05 (×2): 500 mg
  Filled 2022-01-26 (×4): qty 1

## 2022-01-26 MED ORDER — SODIUM CHLORIDE 0.9 % IV SOLN
250.0000 mL | INTRAVENOUS | Status: DC
  Administered 2022-01-30: 250 mL via INTRAVENOUS

## 2022-01-26 MED ORDER — TRAVASOL 10 % IV SOLN
INTRAVENOUS | Status: AC
  Filled 2022-01-26: qty 924

## 2022-01-26 MED ORDER — FENTANYL CITRATE (PF) 100 MCG/2ML IJ SOLN
25.0000 ug | INTRAMUSCULAR | Status: DC | PRN
  Administered 2022-01-26: 100 ug via INTRAVENOUS
  Filled 2022-01-26 (×2): qty 2

## 2022-01-26 MED ORDER — ROCURONIUM BROMIDE 10 MG/ML (PF) SYRINGE
PREFILLED_SYRINGE | INTRAVENOUS | Status: AC
  Filled 2022-01-26: qty 10

## 2022-01-26 MED ORDER — ORAL CARE MOUTH RINSE
15.0000 mL | OROMUCOSAL | Status: DC
  Administered 2022-01-26 – 2022-02-01 (×60): 15 mL via OROMUCOSAL

## 2022-01-26 MED ORDER — SODIUM CHLORIDE 0.9 % IV SOLN
2.0000 g | INTRAVENOUS | Status: DC
  Filled 2022-01-26 (×2): qty 20

## 2022-01-26 MED ORDER — ACETAMINOPHEN 650 MG RE SUPP
650.0000 mg | Freq: Four times a day (QID) | RECTAL | Status: DC | PRN
  Administered 2022-01-27 – 2022-01-30 (×2): 650 mg via RECTAL
  Filled 2022-01-26 (×2): qty 1

## 2022-01-26 MED ORDER — QUETIAPINE FUMARATE 25 MG PO TABS
25.0000 mg | ORAL_TABLET | Freq: Every evening | ORAL | Status: DC | PRN
  Administered 2022-01-29 – 2022-01-30 (×2): 25 mg
  Filled 2022-01-26 (×2): qty 1

## 2022-01-26 MED ORDER — LACTATED RINGERS IV SOLN: INTRAVENOUS | Status: AC

## 2022-01-26 MED ORDER — MIRTAZAPINE 15 MG PO TABS
7.5000 mg | ORAL_TABLET | Freq: Every day | ORAL | Status: DC
  Administered 2022-01-26 – 2022-01-27 (×2): 7.5 mg
  Filled 2022-01-26: qty 1

## 2022-01-26 MED ORDER — CHLORHEXIDINE GLUCONATE 0.12% ORAL RINSE (MEDLINE KIT)
15.0000 mL | Freq: Two times a day (BID) | OROMUCOSAL | Status: DC
  Administered 2022-01-26 – 2022-02-01 (×13): 15 mL via OROMUCOSAL

## 2022-01-26 MED ORDER — MIDAZOLAM HCL 2 MG/2ML IJ SOLN: 1.0000 mg | Freq: Once | INTRAMUSCULAR | Status: AC

## 2022-01-26 MED ORDER — ASCORBIC ACID 500 MG PO TABS
500.0000 mg | ORAL_TABLET | Freq: Every day | ORAL | Status: DC
  Administered 2022-01-28 – 2022-02-08 (×11): 500 mg
  Filled 2022-01-26 (×12): qty 1

## 2022-01-26 MED ORDER — ETOMIDATE 2 MG/ML IV SOLN
INTRAVENOUS | Status: AC
  Administered 2022-01-26: 20 mg via INTRAVENOUS
  Filled 2022-01-26: qty 20

## 2022-01-26 MED ORDER — POLYETHYLENE GLYCOL 3350 17 G PO PACK
17.0000 g | PACK | Freq: Every day | ORAL | Status: DC
Start: 2022-01-27 — End: 2022-01-30
  Administered 2022-01-28: 17 g
  Filled 2022-01-26 (×2): qty 1

## 2022-01-26 MED ORDER — FENTANYL 2500MCG IN NS 250ML (10MCG/ML) PREMIX INFUSION
25.0000 ug/h | INTRAVENOUS | Status: DC
  Administered 2022-01-26: 25 ug/h via INTRAVENOUS
  Administered 2022-01-27 – 2022-01-28 (×2): 100 ug/h via INTRAVENOUS
  Filled 2022-01-26 (×3): qty 250

## 2022-01-26 MED ORDER — NOREPINEPHRINE 4 MG/250ML-% IV SOLN
2.0000 ug/min | INTRAVENOUS | Status: DC
  Administered 2022-01-26: 2 ug/min via INTRAVENOUS
  Administered 2022-01-26: 3 ug/min via INTRAVENOUS
  Administered 2022-01-27: 6 ug/min via INTRAVENOUS
  Administered 2022-01-27: 5 ug/min via INTRAVENOUS
  Administered 2022-01-28: 6 ug/min via INTRAVENOUS
  Filled 2022-01-26 (×4): qty 250

## 2022-01-26 NOTE — Consult Note (Signed)
? ?NAME:  [name] [name], MRN:  [id number], DOB:  1951-01-02, LOS: 9 ?ADMISSION DATE:  01/17/2022, CONSULTATION DATE:  01/26/22 ?REFERRING MD:  Vance Gather, MD CHIEF COMPLAINT:  Aspiration, AHRF  ? ?History of Present Illness:  ?71 year old female with Parkinson's disease and anxiety who presented with abdominal pain and admitted for incarcerated left femoral hernia s/p resection of necrotic small bowel 3/23. Hospital course complicated by postop ileus on TPN. She was started on diet earlier this week. Last night had an episode of emesis. PCCM called to bedside this am for worsening hypoxemia, diaphoresis and respiratory distress. CXR with right middle and lower lobe infiltrate concerning for aspiration. She is tachycardic to 150s, normotensive, SpO2 75% on NRB. ? ?Pertinent  Medical History  ?Parkinson's disease and anxiety ? ?Significant Hospital Events: ?Including procedures, antibiotic start and stop dates in addition to other pertinent events   ?3/23 incarcerated left femoral hernia s/p resection of necrotic small bowel  ? ?Interim History / Subjective:  ?As above ? ?Objective   ?Blood pressure 128/79, pulse (!) 169, temperature 99.6 ?F (37.6 ?C), temperature source Oral, resp. rate 18, height '5\' 7"'$  (1.702 m), weight 48.9 kg, SpO2 (!) 79 %. ?   ?   ? ?Intake/Output Summary (Last 24 hours) at 01/26/2022 1016 ?Last data filed at 01/26/2022 [phone] ?Gross per 24 hour  ?Intake 2017.83 ml  ?Output --  ?Net 2017.83 ml  ? ?Filed Weights  ? 01/17/22 2331 01/25/22 0450 01/26/22 0630  ?Weight: 43.4 kg 46.8 kg 48.9 kg  ? ?Physical Exam: ?General: Critically ill-appearing, in respiratory distress, diaphoretic ?HENT: [place], AT, OP clear, MMM, NRB in place, NGT in place ?Eyes: EOMI, no scleral icterus ?Respiratory: Diminished right breath sounds. Left lung air entry No  wheezing or rales ?Cardiovascular: Tachycardic, RR, -M/R/G, no JVD ?GI: BS+, soft, nontender ?Extremities:-Edema,-tenderness ?Neuro: Awake, follows commands, moves  extremities x 4 ? ?Resolved Hospital Problem list   ? ? ?Assessment & Plan:  ? ?Acute hypoxemic respiratory failure secondary to aspiration pneumonia/pneumonitis ?Sepsis secondary to above ?- Arrived on NRB ?- High risk for intubation ?- Will trial HHFNC however mental status borderline and not a candidate for NIV with NGT/aspiration ?- Start ceftriaxone ?- F/u LA ? ?Femoral hernia of left side with obstruction with post-op ileus ?s/p resection 01/17/2022.  ?- Post-op management per surgery ?- NGT to LIWS ?  ?Failure to thrive in adult ?- Severely malnourished baseline clearly predates her acute illness. BMI is < 15.  ?- Palliative involved. Full code confirmed by patient and family. Goal is to "get well" back to PLOF. Will pursue SNF placement once no longer TPN-dependent.   ?  ?Anxiety ?- Has been on buspar '5mg'$  BID, valium '2mg'$  (though this was prn vertigo).  ?- Appreciate psychiatry evaluations: Remeron  ?- Seroquel ? ?Parkinson disease ([place]) ?- Start Sinemet when able ? ?Pressure injury of coccygeal region, stage 1 ?POA, offload and optimize nutritional status as much as possible.  ? ?Best Practice (right click and "Reselect all SmartList Selections" daily)  ? ?Diet/type: NPO and TPN ?DVT prophylaxis: LMWH ?GI prophylaxis: N/A ?Lines: N/A ?Foley:  N/A ?Code Status:  full code ?Last date of multidisciplinary goals of care discussion [4/1] Confirmed full code with family ? ?Labs   ?CBC: ?Recent Labs  ?Lab 01/20/22 ?0130 01/21/22 ?0505 01/22/22 ?0139 01/24/22 ?0411  ?WBC 5.2 5.0 6.6 10.0  ?HGB 11.2* 10.4* 10.1* 10.4*  ?HCT 33.5* 32.1* 30.7* 32.4*  ?MCV 90.3 90.4 90.8 92.0  ?PLT 123*  148* 153 225  ? ? ?Basic Metabolic Panel: ?Recent Labs  ?Lab 01/20/22 ?0130 01/21/22 ?0505 01/22/22 ?0139 01/24/22 ?0400 01/25/22 ?0320  ?NA 135 137 136 136 132*  ?K 3.4* 3.6 3.9 4.4 4.4  ?CL 99 100 99 99 96*  ?CO2 '29 30 30 28 28  '$ ?GLUCOSE 131* 122* 129* 121* 114*  ?BUN '20 16 19 23 '$ 27*  ?CREATININE 0.53 0.48 0.51 0.50 0.74  ?CALCIUM 9.1  8.9 9.3 9.5 9.5  ?MG 2.0 1.7 2.0 2.0  --   ?PHOS 2.2* 2.2* 3.5 3.9  --   ? ?GFR: ?Estimated Creatinine Clearance: 49.8 mL/min (by C-G formula based on SCr of 0.74 mg/dL). ?Recent Labs  ?Lab 01/20/22 ?0130 01/21/22 ?0505 01/22/22 ?0139 01/24/22 ?0411  ?WBC 5.2 5.0 6.6 10.0  ? ? ?Liver Function Tests: ?Recent Labs  ?Lab 01/20/22 ?0130 01/21/22 ?0505 01/22/22 ?0139 01/24/22 ?0400  ?AST 13* 14* 12* 18  ?ALT '7 10 9 10  '$ ?ALKPHOS 35* 30* 34* 50  ?BILITOT 1.4* 1.0 0.8 0.5  ?PROT 5.6* 5.8* 5.8* 6.3*  ?ALBUMIN 2.7* 2.6* 2.7* 2.7*  ? ?No results for input(s): LIPASE, AMYLASE in the last 168 hours. ?No results for input(s): AMMONIA in the last 168 hours. ? ?ABG ?   ?Component Value Date/Time  ? TCO2 27 02/28/2021 0834  ?  ? ?Coagulation Profile: ?No results for input(s): INR, PROTIME in the last 168 hours. ? ?Cardiac Enzymes: ?No results for input(s): CKTOTAL, CKMB, CKMBINDEX, TROPONINI in the last 168 hours. ? ?HbA1C: ?Hgb A1c MFr Bld  ?Date/Time Value Ref Range Status  ?01/19/2022 04:36 AM 5.4 4.8 - 5.6 % Final  ?  Comment:  ?  (NOTE) ?Pre diabetes:          5.7%-6.4% ? ?Diabetes:              >6.4% ? ?Glycemic control for   <7.0% ?adults with diabetes ?  ?09/25/2021 03:20 PM 5.8 4.6 - 6.5 % Final  ?  Comment:  ?  Glycemic Control Guidelines for People with Diabetes:Non Diabetic:  <6%Goal of Therapy: <7%Additional Action Suggested:  >8%   ? ? ?CBG: ?Recent Labs  ?Lab 01/22/22 ?1216 01/22/22 ?1713 01/25/22 ?1237 01/26/22 ?2947 01/26/22 ?0602  ?GLUCAP 128* 130* 121* 117* 129*  ? ? ?Review of Systems:   ?Unable to obtain due to critical illness ? ?Past Medical History:  ?She,  has a past medical history of Fibroid, GERD (gastroesophageal reflux disease), Muscle weakness of lower extremity (01/17/2015), Osteopenia, and Parkinson disease ([place]) (01/17/2015).  ? ?Surgical History:  ? ?Past Surgical History:  ?Procedure Laterality Date  ? FINGER SURGERY    ? INGUINAL HERNIA REPAIR Left 01/17/2022  ? Procedure: Left femoral hernia repair  with mesh and small bowel resection and anastomosis;  Surgeon: Kieth Brightly, Arta Bruce, MD;  Location: [facility];  Service: General;  Laterality: Left;  ? MOUTH SURGERY    ? TOE SURGERY    ? TONSILLECTOMY    ? TUBAL LIGATION    ? x2  ?  ? ?Social History:  ? reports that she has never smoked. She has never used smokeless tobacco. She reports that she does not drink alcohol and does not use drugs.  ? ?Family History:  ?Her family history includes Breast cancer in her mother and sister; Cancer in her father; Healthy in her daughter; Heart attack in her son; Hyperlipidemia in her brother; Hypertension in her brother; Rheum arthritis in her mother. There is no history of Parkinsonism.  ? ?Allergies ?Allergies  ?  Allergen Reactions  ? Sulfa Antibiotics Other (See Comments)  ?  Childhood allergy   ?  ? ?Home Medications  ?Prior to Admission medications   ?Medication Sig Start Date End Date Taking? Authorizing Provider  ?Ascorbic Acid (VITAMIN C PO) Take 1 tablet by mouth daily.   Yes [provider]  ?b complex vitamins capsule Take 1 capsule by mouth daily.   Yes [provider]  ?busPIRone (BUSPAR) 5 MG tablet TAKE ONE TABLET BY MOUTH TWICE DAILY ?Patient taking differently: Take 5 mg by mouth 2 (two) times daily. 01/04/22  Yes Vivi Barrack, MD  ?Carbidopa-Levodopa ER (SINEMET CR) 25-100 MG tablet controlled release Take 2 tablets by mouth at bedtime. 01/05/22  Yes [provider]  ?Cholecalciferol (VITAMIN D) 2000 UNITS CAPS Take 2,000 Units by mouth daily.   Yes [provider]  ?diazepam (VALIUM) 2 MG tablet Take 0.5 tablets (1 mg total) by mouth every 12 (twelve) hours as needed (vertigo). 09/25/21  Yes Vivi Barrack, MD  ?glucosamine-chondroitin 500-400 MG tablet Take 1 tablet by mouth daily.   Yes [provider]  ?ibuprofen (ADVIL,MOTRIN) 200 MG tablet Take 400 mg by mouth every 6 (six) hours as needed for moderate pain.    Yes [provider]  ?Magnesium  Hydroxide (MAGNESIA PO) Take 1 tablet by mouth daily.   Yes [provider]  ?Multiple Vitamin (MULTIVITAMIN) capsule Take 1 capsule by mouth daily.   Yes [provider]  ?Omega-3 Fatty Acids (OME

## 2022-01-26 NOTE — Procedures (Signed)
Central Venous Catheter Insertion Procedure Note ? ?[name] [name]  ?[id number]  ?[birth year] ? ?Date:01/26/22  ?Time:3:28 PM  ? ?Provider Performing:Sahara Fujimoto  ? ?Procedure: Insertion of Non-tunneled Central Venous Catheter(36556) with US guidance (40981)  ? ?Indication(s) ?Medication administration ? ?Consent ?Risks of the procedure as well as the alternatives and risks of each were explained to the patient and/or caregiver.  Consent for the procedure was obtained and is signed in the bedside chart ? ?Anesthesia ?Topical  with 1% lidocaine/ pt on vent with prn versed IV before procedure  ? ?Timeout ?Verified patient identification, verified procedure, site/side was marked, verified correct patient position, special equipment/implants available, medications/allergies/relevant history reviewed, required imaging and test results available. ? ?Sterile Technique ?Maximal sterile technique including full sterile barrier drape, hand hygiene, sterile gown, sterile gloves, mask, hair covering, sterile ultrasound probe cover (if used). ? ?Procedure Description ?Area of catheter insertion was cleaned with chlorhexidine and draped in sterile fashion.  With real-time ultrasound guidance  Easy venous access achieved with and without u/s guidance on both IJ's but unable to thread wire despite help by Dr Arnoldo Lenis so abandoned both of these sites and placed in [facility] position 2nd attempt, no air. Nonpulsatile blood flow and easy flushing noted in all ports.  The catheter was sutured in place and sterile dressing appliedm @ 20 cm ? ?Complications/Tolerance ?None; patient tolerated the procedure well. ?Chest X-ray is ordered to verify placement for   subclavian cannulation.   ?EBL ?Minimal ? ?Specimen(s) ?None ? ? ?Christinia Gully, MD ?Pulmonary and Critical Care Medicine ?[place] ?Cell [phone]  ? ?After 7:00 pm call Elink  [phone]  ? ? ?

## 2022-01-26 NOTE — Progress Notes (Signed)
Patient's daughter requested RN due to patient shaking and discomfort. RN to bedside. Vitals obtained, patient HR in 140's and O2 sats in 70's on room air. 4L [place] applied and MD notified of elevated HR, too soon for dose of PRN metoprolol. Surgery at bedside to evaluate patient, rapid response initiated by this RN and charge RN informed. Surgical team replaced NGT at bedside, bed obtained on 2M11. Report called to receiving RN and transfer initiated.  ?

## 2022-01-26 NOTE — Consult Note (Addendum)
Psychiatry consult was placed for increased anxiety.  RN reported patient was transferred [phone]) for higher level of care.  ? ? Fani was intubated, NP unable to evaluated at this time. Family at bedside.  ?Please re-consult psychiatry as needed.  ?

## 2022-01-26 NOTE — Progress Notes (Signed)
? ?  Palliative Medicine Progress Note ? ?HPI/Patient Profile: 71 y.o. female  with past medical history of Parkinson's, anxiety admitted on 01/17/2022 with abdominal pain, vomiting, constipation. ?  ?Patient was admitted for obstructing femoral hernia now with post-op ileus. PMT has been consulted to assist with goals of care conversation. ? ?Subjective: ?Medical records reviewed including progress notes, labs, imaging. Patient assessed at the bedside, intubated today after rapid response. Patient's daughters are present at the bedside. Relocated to waiting area with patient's 2 brothers, son, and pastor.  ? ?Created space and opportunity for family's thoughts and feelings on patient's current illness. Discussed interval history since our last goc discussion. They are very overwhelmed by patient's sudden respiratory failure and also concerned that NGT was allowed to remain out in the first place. Daughter tells me that she was requesting CXR and although patient appeared calm, "that was from pain meds not from feeling better." They continue to share their commitment to advocating for the patient and documenting events of this hospitalization to ensure best possible care for Ms. Patty. Supported family through prayer, therapeutic listening. They are relying on hope and prayer, requesting I return tomorrow for further discussion. ? ?Questions and concerns addressed. PMT will continue to support holistically. ? ?Physical Exam: ?General: frail, elderly AA F ?Cardiac: tachycardic ?Respiratory: intubated, tachypneic  ?Neuro: sedated ?Skin: warm and dry ? ?Assessment: ?Parkinson's ?Anxiety  ?Incarcerated femoral hernia s/p hernia repair ?Ileus ?Failure to thrive ?Acute hypoxic respiratory failure ?Goals of care conversation ? ?Plan: ?Continue full code/full scope treatment ?Psychosocial and emotional support provided ?Will follow up with family tomorrow as requested ? ? ? ?MDM: High ? ? ?Dorthy Cooler,  PA-C ?Palliative Medicine Team ?Team phone # [phone] ? ?Thank you for allowing the Palliative Medicine Team to assist in the care of this patient. Please utilize secure chat with additional questions, if there is no response within 30 minutes please call the above phone number. ? ?Palliative Medicine Team providers are available by phone from 7am to 7pm daily and can be reached through the team cell phone.  ?Should this patient require assistance outside of these hours, please call the patient's attending physician.  ?

## 2022-01-26 NOTE — Procedures (Signed)
Bronchoscopy Procedure Note ? ?[name] [name]  ?[id number]  ?[birth year] ? ?Date:01/26/22  ?Time:4:51 PM  ? ?Provider Performing:Kasy Iannacone Rodman Pickle, MD ? ?Procedure(s):  Flexible Bronchoscopy (02233) ? ?Indication(s) ?Hypoxemia ?ETT troubleshooting ? ?Consent ?Risks of the procedure as well as the alternatives and risks of each were explained to the patient and/or caregiver.  Consent for the procedure was obtained and is signed in the bedside chart Verbally obtained by daughter at bedside ? ?Anesthesia ?Fentanyl ? ? ?Time Out ?Verified patient identification, verified procedure, site/side was marked, verified correct patient position, special equipment/implants available, medications/allergies/relevant history reviewed, required imaging and test results available. ? ? ?Sterile Technique ?Usual hand hygiene, masks, gowns, and gloves were used ? ? ?Procedure Description ?Bronchoscope advanced through endotracheal tube and into airway.  Airways were examined with end bronchoscopy visualizing tracheobronchial tree. Glidescope demonstrated cuff above vocal cords. Cuff was deflated and advanced until below vocal cords and re-inflated. Bronchoscope was re-introduced and confirmed ETT ABOVE carina. ? ?Findings:  ?As above ? ? ?Complications/Tolerance ?None; patient tolerated the procedure well. ?Chest X-ray reviewed post-procedure with ETT appearing that it is on carina. ? ? ?EBL ?Minimal ? ? ?Specimen(s) ?None ? ?

## 2022-01-26 NOTE — Progress Notes (Signed)
9 Days Post-Op  ? ?Subjective/Chief Complaint: ?Patient has episode of vomiting last night as well as elevated HR. This improved this morning, but upon my arrival she had a pulse in the 160s and sats in the 70s.  BP stable.  She is diaphoretic.  Denies abdominal pain.  Denies current nausea.  CXR and abdominal x-ray completed and revealed a distended stomach and R sided pna.  I placed a 79F NGT in the right nare and immediate returned copious thick bilious drainage.  EKG obtained.  Medical MD evaluated this. ? ?Objective: ?Vital signs in last 24 hours: ?Temp:  [97.7 ?F (36.5 ?C)-99.6 ?F (37.6 ?C)] 99.6 ?F (37.6 ?C) (04/01 0930) ?Pulse Rate:  [97-169] 169 (04/01 0930) ?Resp:  [16-20] 18 (04/01 0930) ?BP: (124-135)/(79-88) 128/79 (04/01 0930) ?SpO2:  [79 %-100 %] 79 % (04/01 0930) ?Weight:  [48.9 kg] 48.9 kg (04/01 0630) ?Last BM Date : 12/28/21 ? ?Intake/Output from previous day: ?03/31 0701 - 04/01 0700 ?In: 2017.8 [P.O.:840; I.V.:1177.8] ?Out: -  ?Intake/Output this shift: ?No intake/output data recorded. ? ?PE: ?Gen: Frail appearing female in respiratory distress ?Heart: regular, but tachy in 160s ?Lungs: diffuse crackles, worse on right than left, but bilateral.  Sats in 70-80s on NRB 15L.   ?Abd: soft, moderate distention, LLQ incision is c/d/I ?Skin: warm and diaphoretic ?Psych: alert but somnolent.  Minimal talking, which is somewhat her baseline. ? ?Lab Results:  ?Recent Labs  ?  01/24/22 ?0411  ?WBC 10.0  ?HGB 10.4*  ?HCT 32.4*  ?PLT 225  ? ?BMET ?Recent Labs  ?  01/24/22 ?0400 01/25/22 ?0320  ?NA 136 132*  ?K 4.4 4.4  ?CL 99 96*  ?CO2 28 28  ?GLUCOSE 121* 114*  ?BUN 23 27*  ?CREATININE 0.50 0.74  ?CALCIUM 9.5 9.5  ? ?PT/INR ?No results for input(s): LABPROT, INR in the last 72 hours. ?ABG ?No results for input(s): PHART, HCO3 in the last 72 hours. ? ?Invalid input(s): PCO2, PO2 ? ?Studies/Results: ?DG CHEST PORT 1 VIEW ? ?Result Date: 01/26/2022 ?CLINICAL DATA:  Chest pain.  Lung crackles.  Abdominal pain.  EXAM: PORTABLE CHEST 1 VIEW COMPARISON:  April 17, 2018 FINDINGS: A right PICC line has been placed in the interval, terminating in the central SVC. The heart, hila, and mediastinum are unremarkable. Skin folds project over the left upper lung. No pneumothorax. Mild opacity is identified in the left retrocardiac region/left base. More significant opacity is identified throughout the right mid lower lung primarily. Air-filled bowel under the right hemidiaphragm is likely colonic. No other abnormalities. IMPRESSION: 1. A right PICC line is in good position. 2. Significant opacity in the right mid and lower lung is worrisome for pneumonia. 3. Opacity in the left retrocardiac region/left base could represent atelectasis or infiltrate. 4. No other acute abnormalities. Electronically Signed   By: Dorise Bullion III M.D.   On: 01/26/2022 10:10  ? ?DG Abd Portable 1V ? ?Result Date: 01/26/2022 ?CLINICAL DATA:  Abdominal pain. EXAM: PORTABLE ABDOMEN - 1 VIEW COMPARISON:  None. FINDINGS: Air-filled loops of small bowel remain. Lucency in the left side of the abdomen may be within a stomach, mildly more distended in the interval. No other interval changes. IMPRESSION: 1. Dilated small bowel loops with a paucity of colonic gas suggest small-bowel obstruction. 2. Increasing air in the left side of the abdomen is likely within a distended stomach, mildly more prominent in the interval. 3. No other changes. Electronically Signed   By: Dorise Bullion III M.D.  On: 01/26/2022 10:12  ? ?DG Abd Portable 1V ? ?Result Date: 01/25/2022 ?CLINICAL DATA:  Vomiting. EXAM: PORTABLE ABDOMEN - 1 VIEW COMPARISON:  January 21, 2022. FINDINGS: Grossly stable small bowel dilatation is noted concerning for ileus or possibly distal small bowel obstruction. No colonic dilatation is noted. IMPRESSION: Grossly stable small bowel dilatation is noted concerning for ileus or possible distal small bowel obstruction. Electronically Signed   By: Marijo Conception  M.D.   On: 01/25/2022 09:21   ? ?Anti-infectives: ?Anti-infectives (From admission, onward)  ? ? Start     Dose/Rate Route Frequency Ordered Stop  ? 01/26/22 1130  cefTRIAXone (ROCEPHIN) 2 g in sodium chloride 0.9 % 100 mL IVPB       ? 2 g ?200 mL/hr over 30 Minutes Intravenous Every 24 hours 01/26/22 1035    ? ?  ? ? ?Assessment/Plan: ?POD 9,S/p emergent left femoral hernia repair with Phasix mesh/ small bowel resection 01/17/22 - Dr. Kieth Brightly for incarcerated left femoral hernia  ?-post op ileus with vomiting overnight and aspiration with acute respiratory decompensation. ?-NGT placed at bedside by myself, confirmation film pending ?-multiple labs pending ?-cont TNA as patient obviously unable to eat at this time ?-daughter at bedside during this event.   ? ?FEN: IVF/TPN ?ID: None ?VTE: SCDs, Lovenox ?Foley: None ?Dispo: ICU ? ?Acute hypoxic respiratory failure secondary to aspiration PNA - appreciate medicine's assistance with this patient and ultimately CCM's assistance.  Patient being moved to the unit for further respiratory management, likely will require intubation and  possible bronchoscopy. ?Tachycardia - lopressor, but likely secondary to above ?Parkinson's disease - per medicine ?FTT in adult ?Anxiety  ? ? LOS: 9 days  ? ? ?Henreitta Cea ?01/26/2022 ? ?

## 2022-01-26 NOTE — Procedures (Signed)
Bronchoscopy Procedure Note ? ?[name] [name]  ?[id number]  ?[birth year] ? ?Date:01/26/22  ?Time:11:09 AM  ? ?Provider Performing:Tinslee Klare Rodman Pickle, MD  ? ?Procedure(s):  Initial Therapeutic Aspiration of Tracheobronchial Tree [phone]) ? ?Indication(s) ?Aspiration ? ?Consent ?Unable to obtain consent due to emergent nature of procedure. ? ?Anesthesia ?Fentanyl, etomidate ? ? ?Time Out ?Verified patient identification, verified procedure, site/side was marked, verified correct patient position, special equipment/implants available, medications/allergies/relevant history reviewed, required imaging and test results available. ? ? ?Sterile Technique ?Usual hand hygiene, masks, gowns, and gloves were used ? ? ?Procedure Description ?Bronchoscope advanced through endotracheal tube and into airway.  Airways were examined down to subsegmental level with findings noted below.   ?Following diagnostic evaluation, Therapeutic aspiration performed in right lung of copious thin frothy secretions ? ?Findings: Normal anatomy and mucosa. Copious thin frothy secretions in right lung aspirated and cleared from airway before removal of bronchoscope. ? ? ?Complications/Tolerance ?None; patient tolerated the procedure well. ?Chest X-ray is not needed post procedure. ? ? ?EBL ?None ? ? ?Specimen(s) ?None ? ?Rodman Pickle, M.D. ?[facility] ?01/26/2022 11:11 AM  ? ?See Amion for personal pager ?For hours between 7 PM to 7 AM, please call Elink for urgent questions ? ? ?

## 2022-01-26 NOTE — Procedures (Signed)
Intubation Procedure Note ? ?[name]  ?[id number]  ?1950-12-19 ? ?Date:01/26/22  ?Time:11:08 AM  ? ?Provider Performing:Sema Stangler Rodman Pickle, MD  ? ? ?Procedure: Intubation (37902) ? ?Indication(s) ?Respiratory Failure ? ?Consent ?Risks of the procedure as well as the alternatives and risks of each were explained to the patient and/or caregiver.  Consent for the procedure was obtained and is signed in the bedside chart ? ? ?Anesthesia ?Etomidate, Versed, and Fentanyl ? ? ?Time Out ?Verified patient identification, verified procedure, site/side was marked, verified correct patient position, special equipment/implants available, medications/allergies/relevant history reviewed, required imaging and test results available. ? ? ?Sterile Technique ?Usual hand hygeine, masks, and gloves were used ? ? ?Procedure Description ?Patient positioned in bed supine.  Sedation given as noted above.  Patient was intubated with endotracheal tube using Glidescope.  View was Grade 1 full glottis .  Number of attempts was 1.  Colorimetric CO2 detector was consistent with tracheal placement. ? ? ?Complications/Tolerance ?None; patient tolerated the procedure well. ?Chest X-ray is ordered to verify placement. ? ? ?EBL ?Minimal ? ? ?Specimen(s) ?None ? ?Rodman Pickle, M.D. ?[facility] ?01/26/2022 11:08 AM  ? ?See Amion for personal pager ?For hours between 7 PM to 7 AM, please call Elink for urgent questions ? ?

## 2022-01-26 NOTE — Progress Notes (Signed)
PROGRESS NOTE ? ?Subjective: ?Overnight events include an episode of vomiting, rising HR, this early morning. HR better with IV metoprolol. Daughter at bedside reports pt complaining of right-sided lower rib pain. Pt is shaking, can't get warm. Did not complain of dyspnea.  ? ?Objective: ?BP 128/79 (BP Location: Left Arm)   Pulse (!) 169   Temp 99.6 ?F (37.6 ?C) (Oral)   Resp 18   Ht '5\' 7"'$  (1.702 m)   Wt 48.9 kg   SpO2 (!) 79%   BMI 16.88 kg/m?   ?Gen: Cachectic elderly female appearing acutely ill ?Pulm: Severely coarse on R > L, tachypneic. Last SpO2 was ok overnight but this morning into 70%'s. ?CV: Regular tachycardia in ~120's, no murmur, no JVD, no edema ?GI: Distended, tender diffusely, no rigidity ?Neuro: Alert and oriented. Moves all extremities. ?Skin: Surgical wounds look ok. ? ?Assessment & Plan: ?[name] [name] appears to have vomiting overnight and is rapidly developing respiratory failure. Labs ordered at time of first encounter this morning are still pending. XR's of abd/chest indicate dense right sided opacity and persistent dilated small bowel. Formal reads pending.  ?- CCM consulted, Dr. Loanne Drilling at bedside. Will move to ICU. I've confirmed full code status, desire for intubation if needed with her daughter and the patient on previous encounters and again today. NRB placed still with hypoxia and distress. May require ETT.  ?- Tachycardic but not hypotensive. ?- NG tube replaced by surgery at bedside at this time as well.  ?- Appreciate the efforts of the entire care team. ? ?[name] Pour, MD ?Pager on amion ?01/26/2022, 10:14 AM   ?

## 2022-01-26 NOTE — Progress Notes (Signed)
Pt on q4 vitals continuously  ?

## 2022-01-26 NOTE — Progress Notes (Signed)
PHARMACY - TOTAL PARENTERAL NUTRITION CONSULT NOTE  ? ?Indication: Prolonged ileus ? ?Patient Measurements: ?Height: '5\' 7"'$  (170.2 cm) ?Weight: 48.9 kg (107 lb 12.9 oz) ?IBW/kg (Calculated) : 61.6 ?TPN AdjBW (KG): 43.4 ?Body mass index is 16.88 kg/m?. ?Usual Weight: ~115 lbs ? ?Assessment:  ?71 y.o. F s/p femoral hernia repair with SBR 2/2 gangrene on 3/23. Pt with NGT to suction. Noted poor po intake for about 6 months due to living situation. Daughter reports pt UBW ~115 lbs with wt loss of 20-30 lbs within past few months. No flatus or BM. Pharmacy consulted to start TPN for ileus in pt with severe PCM upon admission. ? ?4/1 - patient with worsening respiratory failure, moved to the ICU and required intubation ? ?Glucose / Insulin: CBGs controlled on no meds ?Electrolytes: last labs 3/31 - Na trend down 132, Cl down to 96, corrected Ca ~10.3 stable, others wnl   ?Renal: SCr stable WNL, BUN up to 27 ?Hepatic: LFTs / Tbili / TG WNL, albumin 2.7 ?Intake / Output; MIVF: UOP unmeasured; + flatus, last stool charted 3/30 - NGT removed 3/29. ?Current MIVF: D51/2NS at 10 ml/hr ?GI Imaging: ?3/23 Incarcerated left inguinal small-bowel hernia with high-grade small bowel obstruction. ?3/27 KUB - suggestive of postoperative ileus ?GI Surgeries / Procedures:  ?3/23 femoral hernia repair with SBR ? ?Central access: PICC placed 3/25  ?TPN start date: 01/19/22 ? ?Nutritional Goals: ?Goal TPN rate is 70 mL/hr (provides 92 g of protein and 1645 kcals per day) ? ?RD Assessment: ?Estimated Needs ?Total Energy Estimated Needs: 1600 - 1800 ?Total Protein Estimated Needs: 80 - 95 grams ?Total Fluid Estimated Needs: >/= 1.6 L ? ?Current Nutrition:  ?FLD started 3/31 ?Ensure Enlive BID - all doses charted yesterday ?TPN at 70 ml/hr - providing 100% of needs ? ?Plan:  ?Continue TPN at goal rate of 70 mL/hr at 1800  ?Electrolytes in TPN: increase Na to 90 mEq/L, K 60 mEq/L, Mag 7 mEq/L, Phos 20 mmol/L, decrease Ca to 3 mEq/L; Cl:Ac to  2:1 ?Add standard MVI and trace elements to TPN ?S/p thiamine '100mg'$  daily x 7 days (3/25>>3/31) for refeeding ?Monitor standard TPN labs Mon/Thurs and PRN ?Monitor Surgery team plans ?Follow-up for progress - consider cycling soon if BG will allow ? ? ?Arturo Morton, PharmD, BCPS ?Please check AMION for all [place] contact numbers ?Clinical Pharmacist ?01/26/2022 9:38 AM ?

## 2022-01-26 NOTE — Progress Notes (Signed)
RT went to move patients ETT to the center and then pt began to have a cuff leak and desat so RT advanced tube 2cm and still had a cuff leak. RT got RN to call MD. Pt was bronched, ETT not In the right place, advanced tube into the vocal cords and used bronchoscope to confirm placement. Pt currently at 27 at the lip and O2 sats are 94% at this time. RT will continue to monitor.  ?

## 2022-01-26 NOTE — Progress Notes (Signed)
Pt daughters had concerns about care and notified the [facility]. Discussed with patient and daughters the care plan for the night. Everyone was in agreement of the plan.Goal was to get some sleep and control pain better.  ? ?She states a continued right rib pain that they don't know why she is having. Family requested a scan to see if something had changed. On call notified and stated, "she would let the primary rounding doctors know in the morning" Family made aware to discuss with primary in morning. Pt would only take Tylenol in beginning of shift which did nothing for the pain per patient. However continued to refuse Robaxin and Oxy IR. Pt repositioned multiple times for comfort. Pt and family refused Remeron stating they were worried about side effects but did take the Seroquel PRN. ? ?After PRN given, discussed with pt and family if they would like to be woken to do every two hour turns if asleep. Requested the turn at 2200 and 0000, then if awake turns but if sleeping no more turns until 6 am.  ? ?AC saw patient and discussed care with daughters. ? ?After more education on pain control pt did take the Oxy and was able to sleep for several hours with no complaints noted. When pt woke around 2 am she stated terrible cramping in legs and allowed RN to give her Robaxin at this time. Ankle and leg massage given. Side table at bedside with water available, call bell within reach, and bright overhead lights on per patient request. Pt tolerated bedpan and bed pad change at this time. ? ?Pt stated robaxin did decrease the cramping and that her rib pain was down to a 3. States feeling very weak compared to before admission and feeling rigid. Did some light passive/active movements in the bed to loosen muscles. ? ?Around 5 am pt HR increased to 130's. Pt stated feeling "off" but wasn't sure why. Dr. Donne Hazel, MD notified and Lopressor IV PRN modified to give if HR is above 110. Within 15 minutes of IV push HR was 99. Pt  stated feeling better quickly.  ? ?Pt did state rt leg was numb, when discussed realized the leg had "fallen asleep" with positioning. Pt repositioned and legs of bed lowered slightly. Pt then felt the "burning tingling" sensation and stated the leg was feeling better. At this time pt states no nausea or abdominal pain so allowed her to lower head of bed to 20 degrees so she could stretch out more. She stated she was comfortable in this position. Pain meds given, CHG bath given, family notified of all changes in status and medications given. ?

## 2022-01-26 NOTE — Progress Notes (Signed)
Auto generated USG PIV request, pt has PICC line.  Sunday Spillers RN confirms no need for USG PIV at this time. ?

## 2022-01-26 NOTE — Progress Notes (Signed)
PCCM Interval Note ? ? ?After initial intubation CXR was concerning for ETT on carina and ETT was retracted 2 cm.  ? ?Called to bedside for hypoxemia and concern for cuff leak. Please see bronchoscopy note from 16:51 for full details.  ? ?ETT found to be above vocal cords. Under visualization of glidescope, ETT was advanced until cuff was below cords. Measured at 26 cm on upper lip. Bronchoscope inserted and confirmed ETT above carina. Repeat CXR with ETT 0.8 cm above carina. No adjustments made. Patient oxygenation improved. ? ? ?

## 2022-01-27 ENCOUNTER — Inpatient Hospital Stay (HOSPITAL_COMMUNITY): Payer: Medicare HMO

## 2022-01-27 DIAGNOSIS — Z7189 Other specified counseling: Secondary | ICD-10-CM | POA: Diagnosis not present

## 2022-01-27 DIAGNOSIS — K460 Unspecified abdominal hernia with obstruction, without gangrene: Secondary | ICD-10-CM | POA: Diagnosis not present

## 2022-01-27 DIAGNOSIS — R627 Adult failure to thrive: Secondary | ICD-10-CM | POA: Diagnosis not present

## 2022-01-27 DIAGNOSIS — K4130 Unilateral femoral hernia, with obstruction, without gangrene, not specified as recurrent: Secondary | ICD-10-CM | POA: Diagnosis not present

## 2022-01-27 LAB — BASIC METABOLIC PANEL
Anion gap: 4 — ABNORMAL LOW (ref 5–15)
BUN: 29 mg/dL — ABNORMAL HIGH (ref 8–23)
CO2: 23 mmol/L (ref 22–32)
Calcium: 7.9 mg/dL — ABNORMAL LOW (ref 8.9–10.3)
Chloride: 108 mmol/L (ref 98–111)
Creatinine, Ser: 0.62 mg/dL (ref 0.44–1.00)
GFR, Estimated: >60 mL/min (ref >60)
Glucose, Bld: 154 mg/dL — ABNORMAL HIGH (ref 70–99)
Potassium: 5 mmol/L (ref 3.5–5.1)
Sodium: 135 mmol/L (ref 135–145)

## 2022-01-27 LAB — GLUCOSE, CAPILLARY
Glucose-Capillary: 130 mg/dL — ABNORMAL HIGH (ref 70–99)
Glucose-Capillary: 136 mg/dL — ABNORMAL HIGH (ref 70–99)
Glucose-Capillary: 130 mg/dL — ABNORMAL HIGH (ref 70–99)
Glucose-Capillary: 136 mg/dL — ABNORMAL HIGH (ref 70–99)
Glucose-Capillary: 122 mg/dL — ABNORMAL HIGH (ref 70–99)

## 2022-01-27 LAB — CBC
HCT: 29 % — ABNORMAL LOW (ref 36.0–46.0)
Hemoglobin: 9.6 g/dL — ABNORMAL LOW (ref 12.0–15.0)
MCH: 30.6 pg (ref 26.0–34.0)
MCHC: 33.1 g/dL (ref 30.0–36.0)
MCV: 92.4 fL (ref 80.0–100.0)
Platelets: 174 10*3/uL (ref 150–400)
RBC: 3.14 MIL/uL — ABNORMAL LOW (ref 3.87–5.11)
RDW: 13.7 % (ref 11.5–15.5)
WBC: 48.6 10*3/uL — ABNORMAL HIGH (ref 4.0–10.5)
nRBC: 0 % (ref 0.0–0.2)

## 2022-01-27 LAB — LACTIC ACID, PLASMA: Lactic Acid, Venous: 1.9 mmol/L (ref 0.5–1.9)

## 2022-01-27 MED ORDER — PIPERACILLIN-TAZOBACTAM 3.375 G IVPB
3.3750 g | Freq: Three times a day (TID) | INTRAVENOUS | Status: DC
  Administered 2022-01-27 – 2022-02-07 (×33): 3.375 g via INTRAVENOUS
  Filled 2022-01-27 (×36): qty 50

## 2022-01-27 MED ORDER — DILTIAZEM HCL-DEXTROSE 125-5 MG/125ML-% IV SOLN (PREMIX)
5.0000 mg/h | INTRAVENOUS | Status: DC
  Administered 2022-01-27: 10 mg/h via INTRAVENOUS
  Administered 2022-01-27: 5 mg/h via INTRAVENOUS
  Filled 2022-01-27 (×3): qty 125

## 2022-01-27 MED ORDER — METOPROLOL TARTRATE 5 MG/5ML IV SOLN: 5.0000 mg | Freq: Once | INTRAVENOUS | Status: AC

## 2022-01-27 MED ORDER — TRAVASOL 10 % IV SOLN
INTRAVENOUS | Status: AC
  Filled 2022-01-27: qty 924

## 2022-01-27 MED ORDER — METOPROLOL TARTRATE 5 MG/5ML IV SOLN
INTRAVENOUS | Status: AC
Start: 2022-01-27 — End: 2022-01-27
  Administered 2022-01-27: 5 mg
  Filled 2022-01-27: qty 5

## 2022-01-27 MED ORDER — IOHEXOL 300 MG/ML  SOLN
100.0000 mL | Freq: Once | INTRAMUSCULAR | Status: AC | PRN
  Administered 2022-01-27: 100 mL via INTRAVENOUS

## 2022-01-27 NOTE — Progress Notes (Signed)
PHARMACY - TOTAL PARENTERAL NUTRITION CONSULT NOTE  ? ?Indication: Prolonged ileus ? ?Patient Measurements: ?Height: '5\' 7"'$  (170.2 cm) ?Weight: 49.6 kg (109 lb 5.6 oz) ?IBW/kg (Calculated) : 61.6 ?TPN AdjBW (KG): 43.4 ?Body mass index is 17.13 kg/m?. ?Usual Weight: ~115 lbs ? ?Assessment:  ?71 y.o. F s/p femoral hernia repair with SBR 2/2 gangrene on 3/23. Pt with NGT to suction. Noted poor po intake for about 6 months due to living situation. Daughter reports pt UBW ~115 lbs with wt loss of 20-30 lbs within past few months. No flatus or BM. Pharmacy consulted to start TPN for ileus in pt with severe PCM upon admission. ? ?4/01 - patient with worsening respiratory failure, moved to the ICU and required intubation ? ?Glucose / Insulin: CBGs controlled on no meds ?Electrolytes: Na 135, Cl 108, corrected Ca ~8.9 (using alb from 3/30), others wnl   ?Renal: SCr stable WNL, BUN 29 (trending down) ?Hepatic: LFTs / Tbili / TG WNL, albumin 2.7 (3/30) ?Intake / Output; MIVF: UOP unmeasured; + flatus, last stool charted 3/30 - NGT removed 3/29. ?Current MIVF: LR at 100 ml/hr ? ?GI Imaging: ?3/23 Incarcerated left inguinal small-bowel hernia with high-grade small bowel obstruction. ?3/27 KUB - suggestive of postoperative ileus ?GI Surgeries / Procedures:  ?3/23 femoral hernia repair with SBR ? ?Central access: PICC placed 3/25  ?TPN start date: 01/19/22 ? ?Nutritional Goals: ?Goal TPN rate is 70 mL/hr (provides 92 g of protein and 1645 kcals per day) ? ?RD Assessment: ?Estimated Needs ?Total Energy Estimated Needs: 1600 - 1800 ?Total Protein Estimated Needs: 80 - 95 grams ?Total Fluid Estimated Needs: >/= 1.6 L ? ?Current Nutrition:  ?FLD started 3/31 ?Ensure Enlive BID - all doses charted yesterday ?TPN at 70 ml/hr - providing 100% of needs ? ?Plan:  ?Continue TPN at goal rate of 70 mL/hr at 1800  ?Electrolytes in TPN: Na 90 mEq/L, decrease K to 50 mEq/L, Mag 7 mEq/L, Phos 20 mmol/L, Ca 3 mEq/L; Cl:Ac to 2:1 ?Add standard MVI  and trace elements to TPN ?S/p thiamine '100mg'$  daily x 7 days (3/25>>3/31) for refeeding ?Monitor standard TPN labs Mon/Thurs and PRN ?Monitor Surgery team plans ?Follow-up for progress - consider cycling if BG will allow ? ? ?Thank you for allowing pharmacy to be a part of this patient?s care. ? ?Ardyth Harps, PharmD ?Clinical Pharmacist ? ?

## 2022-01-27 NOTE — Progress Notes (Signed)
? ?NAME:  [name] [name], MRN:  [id number], DOB:  07/31/1951, LOS: 10 ?ADMISSION DATE:  01/17/2022, CONSULTATION DATE:  01/26/22 ?REFERRING MD:  Vance Gather, MD CHIEF COMPLAINT:  Aspiration, AHRF  ? ?History of Present Illness:  ?71 year old female with Parkinson's disease and anxiety who presented with abdominal pain and admitted for incarcerated left femoral hernia s/p resection of necrotic small bowel 3/23. Hospital course complicated by postop ileus on TPN. She was started on diet earlier this week. Last night had an episode of emesis. PCCM called to bedside this am for worsening hypoxemia, diaphoresis and respiratory distress. CXR with right middle and lower lobe infiltrate concerning for aspiration. She is tachycardic to 150s, normotensive, SpO2 75% on NRB. ? ?Pertinent  Medical History  ?Parkinson's disease and anxiety ? ?Significant Hospital Events: ?Including procedures, antibiotic start and stop dates in addition to other pertinent events   ?3/23 incarcerated left femoral hernia s/p resection of necrotic small bowel  ?4/1 Transferred to medical ICU for aspiration requiring intubation ?Interim History / Subjective:  ?On vent. Weaned FIO2 to 60% ? ?CCS APP concerned for wound leak - will evaluate with attending this am ? ?Objective   ?Blood pressure 96/69, pulse (!) 123, temperature (!) 100.8 ?F (38.2 ?C), temperature source Bladder, resp. rate 16, height '5\' 7"'$  (1.702 m), weight 49.6 kg, SpO2 98 %. ?CVP:  [9 mmHg-11 mmHg] 11 mmHg  ?Vent Mode: PRVC ?FiO2 (%):  [60 %-100 %] 60 % ?Set Rate:  [18 bmp] 18 bmp ?Vt Set:  [490 mL] 490 mL ?PEEP:  [5 cmH20-12 cmH20] 8 cmH20 ?Plateau Pressure:  [13 cmH20-19 cmH20] 13 cmH20  ? ?Intake/Output Summary (Last 24 hours) at 01/27/2022 0954 ?Last data filed at 01/27/2022 0800 ?Gross per 24 hour  ?Intake 3772.04 ml  ?Output 800 ml  ?Net 2972.04 ml  ? ?Filed Weights  ? 01/25/22 0450 01/26/22 0630 01/27/22 0500  ?Weight: 46.8 kg 48.9 kg 49.6 kg  ? ?Physical Exam: ?General: Critically  ill-appearing, no acute distress ?HENT: [place], AT, ETT in place ?Eyes: EOMI, no scleral icterus ?Lymph: no cervical lymphadenopathy ?Respiratory: Rhonchi bilaterally.  No crackles, wheezing or rales ?Cardiovascular: RRR, -M/R/G, no JVD ?GI: Hypoactive, soft, nontender, left inguinal dressing with drainage ?Extremities:-Edema,-tenderness ?Neuro: Awake and alert, follows commands ?GU: Foley in place ? ?WBC 29>48 ?Resolved Hospital Problem list   ? ? ?Assessment & Plan:  ? ?Septic shock secondary to aspiration pneumonia, abdominal wound leak ?- Surgery following ?- Broaden to Zosyn ?- F/u CT CAP  ?- Wean pressors for MAP goal >65 ?- Trend LA ? ?Acute hypoxemic respiratory failure secondary to aspiration pneumonia/pneumonitis ?- Full vent support. Wean FIO2/PEEP for goal SpO2 88-95% ?- SBT/WUA when able ?- Antibiotics ?- VAP ? ?Femoral hernia of left side with obstruction with post-op ileus ?s/p resection 01/17/2022.  4/2 Surgery concerned for wound leak ?- Post-op management per surgery ?- NGT to LIWS ?  ?Failure to thrive in adult ?- Severely malnourished baseline clearly predates her acute illness. BMI is < 15.  ?- Palliative involved. Full code confirmed by patient and family. Goal is to "get well" back to PLOF. Will pursue SNF placement once no longer TPN-dependent.   ?  ?Anxiety ?- Has been on buspar '5mg'$  BID, valium '2mg'$  (though this was prn vertigo).  ?- Appreciate psychiatry evaluations: Remeron  ?- Seroquel ? ?Parkinson disease ([place]) ?- Start Sinemet when able ? ?Pressure injury of coccygeal region, stage 1 ?POA, offload and optimize nutritional status as much as possible.  ? ?  Best Practice (right click and "Reselect all SmartList Selections" daily)  ? ?Diet/type: NPO and TPN ?DVT prophylaxis: LMWH ?GI prophylaxis: N/A ?Lines: N/A ?Foley:  N/A ?Code Status:  full code ?Last date of multidisciplinary goals of care discussion [4/2] Updated daughter at bedside ? ?Labs   ?CBC: ?Recent Labs  ?Lab 01/21/22 ?0505  01/22/22 ?0139 01/24/22 ?0411 01/26/22 ?1604 01/26/22 ?1616 01/27/22 ?0408  ?WBC 5.0 6.6 10.0 29.0*  --  48.6*  ?HGB 10.4* 10.1* 10.4* 11.0* 10.5* 9.6*  ?HCT 32.1* 30.7* 32.4* 33.6* 31.0* 29.0*  ?MCV 90.4 90.8 92.0 92.8  --  92.4  ?PLT 148* 153 225 205  --  174  ? ? ?Basic Metabolic Panel: ?Recent Labs  ?Lab 01/21/22 ?0505 01/22/22 ?0139 01/24/22 ?0400 01/25/22 ?0320 01/26/22 ?1604 01/26/22 ?1616 01/27/22 ?0408  ?NA 137 136 136 132* 134* 134* 135  ?K 3.6 3.9 4.4 4.4 4.5 4.5 5.0  ?CL 100 99 99 96* 106  --  108  ?CO2 '30 30 28 28 '$ 20*  --  23  ?GLUCOSE 122* 129* 121* 114* 115*  --  154*  ?BUN '16 19 23 '$ 27* 31*  --  29*  ?CREATININE 0.48 0.51 0.50 0.74 0.73  --  0.62  ?CALCIUM 8.9 9.3 9.5 9.5 8.0*  --  7.9*  ?MG 1.7 2.0 2.0  --   --   --   --   ?PHOS 2.2* 3.5 3.9  --   --   --   --   ? ?GFR: ?Estimated Creatinine Clearance: 50.5 mL/min (by C-G formula based on SCr of 0.62 mg/dL). ?Recent Labs  ?Lab 01/22/22 ?0139 01/24/22 ?0411 01/26/22 ?1604 01/27/22 ?0408  ?WBC 6.6 10.0 29.0* 48.6*  ?LATICACIDVEN  --   --  3.6*  --   ? ? ?Liver Function Tests: ?Recent Labs  ?Lab 01/21/22 ?0505 01/22/22 ?0139 01/24/22 ?0400  ?AST 14* 12* 18  ?ALT '10 9 10  '$ ?ALKPHOS 30* 34* 50  ?BILITOT 1.0 0.8 0.5  ?PROT 5.8* 5.8* 6.3*  ?ALBUMIN 2.6* 2.7* 2.7*  ? ?No results for input(s): LIPASE, AMYLASE in the last 168 hours. ?No results for input(s): AMMONIA in the last 168 hours. ? ?ABG ?   ?Component Value Date/Time  ? PHART 7.316 (L) 01/26/2022 1616  ? PCO2ART 40.9 01/26/2022 1616  ? PO2ART 64 (L) 01/26/2022 1616  ? HCO3 20.6 01/26/2022 1616  ? TCO2 22 01/26/2022 1616  ? ACIDBASEDEF 5.0 (H) 01/26/2022 1616  ? O2SAT 88 01/26/2022 1616  ?  ? ?Coagulation Profile: ?No results for input(s): INR, PROTIME in the last 168 hours. ? ?Cardiac Enzymes: ?No results for input(s): CKTOTAL, CKMB, CKMBINDEX, TROPONINI in the last 168 hours. ? ?HbA1C: ?Hgb A1c MFr Bld  ?Date/Time Value Ref Range Status  ?01/19/2022 04:36 AM 5.4 4.8 - 5.6 % Final  ?  Comment:  ?   (NOTE) ?Pre diabetes:          5.7%-6.4% ? ?Diabetes:              >6.4% ? ?Glycemic control for   <7.0% ?adults with diabetes ?  ?09/25/2021 03:20 PM 5.8 4.6 - 6.5 % Final  ?  Comment:  ?  Glycemic Control Guidelines for People with Diabetes:Non Diabetic:  <6%Goal of Therapy: <7%Additional Action Suggested:  >8%   ? ? ?CBG: ?Recent Labs  ?Lab 01/26/22 ?1527 01/26/22 ?2052 01/26/22 ?2341 01/27/22 ?0400 01/27/22 ?0732  ?GLUCAP 109* 127* 134* 130* 136*  ? ? ?Critical care time: 68 min ?  ? ?The patient  is critically ill with multiple organ systems failure and requires high complexity decision making for assessment and support, frequent evaluation and titration of therapies, application of advanced monitoring technologies and extensive interpretation of multiple databases.   ? ?Rodman Pickle, M.D. ?[facility] ?01/27/2022 9:54 AM  ? ?Please see Amion for pager number to reach on-call Pulmonary and Critical Care Team. ? ? ? ?

## 2022-01-27 NOTE — Progress Notes (Signed)
? ?  Palliative Medicine Progress Note ? ?HPI/Patient Profile: 71 y.o. female  with past medical history of Parkinson's, anxiety admitted on 01/17/2022 with abdominal pain, vomiting, constipation. ?  ?Patient was admitted for obstructing femoral hernia now with post-op ileus. PMT has been consulted to assist with goals of care conversation. ? ?Subjective: ?Medical records reviewed including progress notes, labs, imaging. Discussed with RN. Patient assessed at the bedside, remains intubated and hypotensive. Patient's daughter, son, and daughter-in-law are present at the bedside. ? ?We discussed overall poor prognosis and plan to evaluate source of post-op wound infection. Family shares their continued faith that patient will recover from this acute illness. I shared my worry that patient will have a worse quality of life even if she recovers, which family agrees was already poor at baseline. Counseled family that she may not survive this hospitalization and explored what patient would want for herself knowing the best possible outcome is likely a difficult, prolonged recovery with 24/7 care dependence and prolonged suffering. Patient's daughter Waldemar Dickens shares that she relies on God to let her know when it becomes time to fight for patient's comfort rather than patient's life. Spoke with son separately who also feels that patient's would want all possible interventions to be attempted, including additional surgery and CPR. Son is appreciative of realistic prognosis and shares that he will prepare for this likely outcome while continuing to pursue aggressive care. ? ?Questions and concerns addressed. PMT will continue to support holistically. ? ?Physical Exam: ?General: frail, elderly AA F ?Cardiac: tachycardic, afib ?Respiratory: intubated ?Neuro: sedated ?Skin: warm and dry ? ?Assessment: ?Parkinson's ?Anxiety  ?Incarcerated femoral hernia s/p hernia repair ?Ileus ?Failure to thrive ?Acute hypoxic respiratory  failure ?Septic shock ?Aspiration pneumonia ?Goals of care conversation ? ?Plan: ?Continue full code/full scope treatment ?Patient's family is heavily relying on faith and hoping for improvement ?Patient's family would pursue all interventions to prolong patient's life per her wishes, would only consider comfort-focused care if patient states readiness herself ?Psychosocial and emotional support provided ? ? ? ?MDM: High ? ? ?Dorthy Cooler, PA-C ?Palliative Medicine Team ?Team phone # [phone] ? ?Thank you for allowing the Palliative Medicine Team to assist in the care of this patient. Please utilize secure chat with additional questions, if there is no response within 30 minutes please call the above phone number. ? ?Palliative Medicine Team providers are available by phone from 7am to 7pm daily and can be reached through the team cell phone.  ?Should this patient require assistance outside of these hours, please call the patient's attending physician.  ?

## 2022-01-27 NOTE — Progress Notes (Signed)
10 Days Post-Op  ? ?Subjective/Chief Complaint: ?Patient on vent this morning.  Grabbing at my hands.  On 40 mcg of neo. ? ?Objective: ?Vital signs in last 24 hours: ?Temp:  [99.7 ?F (37.6 ?C)-102 ?F (38.9 ?C)] 100.8 ?F (38.2 ?C) (04/02 1000) ?Pulse Rate:  [100-164] 131 (04/02 1000) ?Resp:  [16-41] 21 (04/02 1000) ?BP: (65-125)/(32-104) 121/69 (04/02 1000) ?SpO2:  [83 %-100 %] 97 % (04/02 1000) ?FiO2 (%):  [60 %-100 %] 60 % (04/02 0746) ?Weight:  [49.6 kg] 49.6 kg (04/02 0500) ?Last BM Date : 12/28/21 ? ?Intake/Output from previous day: ?04/01 0701 - 04/02 0700 ?In: 3355.2 [I.V.:3355.2] ?Out: 800 [Urine:800] ?Intake/Output this shift: ?Total I/O ?In: 416.9 [I.V.:416.9] ?Out: -  ? ?PE: ?Gen: Frail appearing female in respiratory distress ?Heart: regular, tachy ?Lungs: on vent ?Abd: soft, moderate distention, LLQ incision with copious foul smelling brown drainage and opened up.  Difficult to tell if this is feculent from anastomotic breakdown vs old infected hematoma.  NGT cannister just changed and minimal output currently. No output from NGT documented in last 24 hours. ? ?Lab Results:  ?Recent Labs  ?  01/26/22 ?6720 01/26/22 ?1616 01/27/22 ?0408  ?WBC 29.0*  --  48.6*  ?HGB 11.0* 10.5* 9.6*  ?HCT 33.6* 31.0* 29.0*  ?PLT 205  --  174  ? ?BMET ?Recent Labs  ?  01/26/22 ?1604 01/26/22 ?1616 01/27/22 ?0408  ?NA 134* 134* 135  ?K 4.5 4.5 5.0  ?CL 106  --  108  ?CO2 20*  --  23  ?GLUCOSE 115*  --  154*  ?BUN 31*  --  29*  ?CREATININE 0.73  --  0.62  ?CALCIUM 8.0*  --  7.9*  ? ?PT/INR ?No results for input(s): LABPROT, INR in the last 72 hours. ?ABG ?Recent Labs  ?  01/26/22 ?1616  ?PHART 7.316*  ?HCO3 20.6  ? ? ?Studies/Results: ?DG CHEST PORT 1 VIEW ? ?Result Date: 01/26/2022 ?CLINICAL DATA:  Follow-up respiratory failure. EXAM: PORTABLE CHEST 1 VIEW COMPARISON:  Earlier today FINDINGS: ET tube has been advanced. The tip is now right arm PICC line tip terminates in the distal SVC. There is a left subclavian central  venous catheter with tip in the cavoatrial junction. Enteric tube tip and side port are well below the level of the GE junction. Unchanged left lower lobe atelectasis. Diffuse airspace disease throughout the right lung is again noted and appears unchanged from previous exam. IMPRESSION: 1. Satisfactory position of support apparatus as described above. 2. No change in aeration to the lungs compared with previous exam. Electronically Signed   By: Kerby Moors M.D.   On: 01/26/2022 17:24  ? ?DG CHEST PORT 1 VIEW ? ?Result Date: 01/26/2022 ?CLINICAL DATA:  Tube placement EXAM: PORTABLE CHEST 1 VIEW COMPARISON:  01/26/2022 at 1107 hours FINDINGS: Interval placement of left subclavian approach central venous catheter with distal tip terminating at the level of the mid right atrium. Interval retraction of the endotracheal tube, now terminating approximately 6.7 cm above the carina. Enteric tube terminates within the stomach. Right-sided PICC line terminates at the level of the mid SVC. Stable heart size. Dense airspace opacity throughout the right lung. Streaky left basilar atelectasis. No pleural effusion or pneumothorax. IMPRESSION: 1. Interval placement of left subclavian approach central venous catheter with distal tip terminating at the level of the mid right atrium. No pneumothorax. 2. Interval retraction of endotracheal tube, now terminating approximately 6.7 cm above the carina. Electronically Signed   By: Davina Poke  D.O.   On: 01/26/2022 16:42  ? ?Portable Chest x-ray ? ?Result Date: 01/26/2022 ?CLINICAL DATA:  Endotracheal tube placement. EXAM: PORTABLE CHEST 1 VIEW COMPARISON:  Study earlier today FINDINGS: An endotracheal tube is identified with tip at the carina-recommend 2-3 cm retraction. An NG tube is noted with tip overlying the proximal mid stomach. A RIGHT PICC line is present with tip overlying the LOWER SVC. Diffuse airspace opacity throughout the majority of the RIGHT lung again noted. LEFT  basilar atelectasis is again noted. There is no evidence pneumothorax or large pleural effusion. IMPRESSION: 1. Endotracheal tube with tip at the carina - recommend 2-3 cm retraction. 2. No significant change in RIGHT lung airspace opacities and LEFT basilar atelectasis. These results will be called to the ordering clinician or representative by the Radiologist Assistant, and communication documented in the PACS or [employer]. Electronically Signed   By: Margarette Canada M.D.   On: 01/26/2022 11:35  ? ?DG CHEST PORT 1 VIEW ? ?Result Date: 01/26/2022 ?CLINICAL DATA:  Chest pain.  Lung crackles.  Abdominal pain. EXAM: PORTABLE CHEST 1 VIEW COMPARISON:  April 17, 2018 FINDINGS: A right PICC line has been placed in the interval, terminating in the central SVC. The heart, hila, and mediastinum are unremarkable. Skin folds project over the left upper lung. No pneumothorax. Mild opacity is identified in the left retrocardiac region/left base. More significant opacity is identified throughout the right mid lower lung primarily. Air-filled bowel under the right hemidiaphragm is likely colonic. No other abnormalities. IMPRESSION: 1. A right PICC line is in good position. 2. Significant opacity in the right mid and lower lung is worrisome for pneumonia. 3. Opacity in the left retrocardiac region/left base could represent atelectasis or infiltrate. 4. No other acute abnormalities. Electronically Signed   By: Dorise Bullion III M.D.   On: 01/26/2022 10:10  ? ?DG Abd Portable 1V ? ?Result Date: 01/26/2022 ?CLINICAL DATA:  NG tube placement. EXAM: PORTABLE ABDOMEN - 1 VIEW COMPARISON:  01/26/2022 and prior studies FINDINGS: An NG tube is noted with tip overlying the proximal to mid stomach. Dilated small bowel loops are again noted. Opacities within the RIGHT lung again noted. IMPRESSION: NG tube with tip overlying the proximal to mid stomach. No other significant change. Electronically Signed   By: Margarette Canada M.D.   On: 01/26/2022  11:33  ? ?DG Abd Portable 1V ? ?Result Date: 01/26/2022 ?CLINICAL DATA:  Abdominal pain. EXAM: PORTABLE ABDOMEN - 1 VIEW COMPARISON:  None. FINDINGS: Air-filled loops of small bowel remain. Lucency in the left side of the abdomen may be within a stomach, mildly more distended in the interval. No other interval changes. IMPRESSION: 1. Dilated small bowel loops with a paucity of colonic gas suggest small-bowel obstruction. 2. Increasing air in the left side of the abdomen is likely within a distended stomach, mildly more prominent in the interval. 3. No other changes. Electronically Signed   By: Dorise Bullion III M.D.   On: 01/26/2022 10:12   ? ?Anti-infectives: ?Anti-infectives (From admission, onward)  ? ? Start     Dose/Rate Route Frequency Ordered Stop  ? 01/27/22 1115  piperacillin-tazobactam (ZOSYN) IVPB 3.375 g       ? 3.375 g ?12.5 mL/hr over 240 Minutes Intravenous Every 8 hours 01/27/22 1015    ? 01/26/22 1130  cefTRIAXone (ROCEPHIN) 2 g in sodium chloride 0.9 % 100 mL IVPB  Status:  Discontinued       ? 2 g ?200 mL/hr over  30 Minutes Intravenous Every 24 hours 01/26/22 1035 01/27/22 1015  ? ?  ? ? ?Assessment/Plan: ?POD 10,S/p emergent left femoral hernia repair with Phasix mesh/ small bowel resection 01/17/22 - Dr. Kieth Brightly for incarcerated left femoral hernia  ?-post op ileus, NGT in place ?-incisional wound infection noted.  Dry packing to be placed currently.  Will order a CT scan today to evaluate anastomosis to determine if this is anastomotic leak and feculent drainage vs if this is infected old hematoma that is evacuating.  If this is anastomotic leak, will need to return to OR which unfortunately for her and everything going on, carries significantly high risks. ?-d/w CCM as well and while down for CT A/P would like to add chest as well. ?-cont TNA as patient obviously unable to eat at this time ?-suspect WBC up secondary to lungs and wound ?-cont zosyn ?-daughter at bedside this morning and plan  and care discussed with her. ? ?FEN: IVF/TPN ?ID: zosyn 4/1 --> ?VTE: SCDs, Lovenox ?Foley: None ?Dispo: ICU ? ?Acute hypoxic respiratory failure secondary to aspiration PNA - appreciate CCM assistance with this

## 2022-01-27 NOTE — Progress Notes (Signed)
Pharmacy Antibiotic Note ? ?[name] [name] is a 71 y.o. female admitted on 01/17/2022 with  inta-abdominal infection .  Pharmacy has been consulted for Zosyn dosing. CrCl 50 ml/min ? ?Plan: ?Zosyn 3.375g IV q8h (4 hour infusion). ?Monitor renal function, clinical status and cultures ? ?Height: '5\' 7"'$  (170.2 cm) ?Weight: 49.6 kg (109 lb 5.6 oz) ?IBW/kg (Calculated) : 61.6 ? ?Temp (24hrs), Avg:100.8 ?F (38.2 ?C), Min:99.7 ?F (37.6 ?C), Max:102 ?F (38.9 ?C) ? ?Recent Labs  ?Lab 01/21/22 ?0505 01/22/22 ?0139 01/24/22 ?0400 01/24/22 ?0411 01/25/22 ?0320 01/26/22 ?1604 01/27/22 ?0408  ?WBC 5.0 6.6  --  10.0  --  29.0* 48.6*  ?CREATININE 0.48 0.51 0.50  --  0.74 0.73 0.62  ?LATICACIDVEN  --   --   --   --   --  3.6*  --   ?  ?Estimated Creatinine Clearance: 50.5 mL/min (by C-G formula based on SCr of 0.62 mg/dL).   ? ?Allergies  ?Allergen Reactions  ? Sulfa Antibiotics Other (See Comments)  ?  Childhood allergy   ? ? ?Thank you for allowing pharmacy to be a part of this patient?s care. ? ?Alanda Slim, PharmD, FCCM ?Clinical Pharmacist ?Please see AMION for all Pharmacists' Contact Phone Numbers ?01/27/2022, 10:26 AM  ? ? ?

## 2022-01-27 NOTE — Progress Notes (Signed)
Pt transported to CT and back to 2M11 without any complications.  ?

## 2022-01-28 ENCOUNTER — Encounter: Payer: Self-pay | Admitting: Family Medicine

## 2022-01-28 ENCOUNTER — Other Ambulatory Visit: Payer: Self-pay | Admitting: Family Medicine

## 2022-01-28 DIAGNOSIS — J690 Pneumonitis due to inhalation of food and vomit: Secondary | ICD-10-CM

## 2022-01-28 DIAGNOSIS — J9601 Acute respiratory failure with hypoxia: Secondary | ICD-10-CM

## 2022-01-28 DIAGNOSIS — A419 Sepsis, unspecified organism: Secondary | ICD-10-CM

## 2022-01-28 DIAGNOSIS — K4130 Unilateral femoral hernia, with obstruction, without gangrene, not specified as recurrent: Secondary | ICD-10-CM | POA: Diagnosis not present

## 2022-01-28 DIAGNOSIS — Z515 Encounter for palliative care: Secondary | ICD-10-CM

## 2022-01-28 DIAGNOSIS — R6521 Severe sepsis with septic shock: Secondary | ICD-10-CM

## 2022-01-28 LAB — BASIC METABOLIC PANEL
Anion gap: 7 (ref 5–15)
BUN: 18 mg/dL (ref 8–23)
CO2: 22 mmol/L (ref 22–32)
Calcium: 7.8 mg/dL — ABNORMAL LOW (ref 8.9–10.3)
Chloride: 102 mmol/L (ref 98–111)
Creatinine, Ser: 0.43 mg/dL — ABNORMAL LOW (ref 0.44–1.00)
GFR, Estimated: >60 mL/min (ref >60)
Glucose, Bld: 211 mg/dL — ABNORMAL HIGH (ref 70–99)
Potassium: 4 mmol/L (ref 3.5–5.1)
Sodium: 131 mmol/L — ABNORMAL LOW (ref 135–145)

## 2022-01-28 LAB — COMPREHENSIVE METABOLIC PANEL
ALT: 5 U/L (ref 0–44)
AST: 15 U/L (ref 15–41)
Albumin: <1.5 g/dL — ABNORMAL LOW (ref 3.5–5.0)
Alkaline Phosphatase: 49 U/L (ref 38–126)
Anion gap: 5 (ref 5–15)
BUN: 20 mg/dL (ref 8–23)
CO2: 20 mmol/L — ABNORMAL LOW (ref 22–32)
Calcium: 7.7 mg/dL — ABNORMAL LOW (ref 8.9–10.3)
Chloride: 106 mmol/L (ref 98–111)
Creatinine, Ser: 0.39 mg/dL — ABNORMAL LOW (ref 0.44–1.00)
GFR, Estimated: >60 mL/min (ref >60)
Glucose, Bld: 133 mg/dL — ABNORMAL HIGH (ref 70–99)
Potassium: 3.9 mmol/L (ref 3.5–5.1)
Sodium: 131 mmol/L — ABNORMAL LOW (ref 135–145)
Total Bilirubin: 0.5 mg/dL (ref 0.3–1.2)
Total Protein: 4.4 g/dL — ABNORMAL LOW (ref 6.5–8.1)

## 2022-01-28 LAB — CULTURE, RESPIRATORY W GRAM STAIN

## 2022-01-28 LAB — CBC
HCT: 24.8 % — ABNORMAL LOW (ref 36.0–46.0)
Hemoglobin: 8.2 g/dL — ABNORMAL LOW (ref 12.0–15.0)
MCH: 30.4 pg (ref 26.0–34.0)
MCHC: 33.1 g/dL (ref 30.0–36.0)
MCV: 91.9 fL (ref 80.0–100.0)
Platelets: 159 10*3/uL (ref 150–400)
RBC: 2.7 MIL/uL — ABNORMAL LOW (ref 3.87–5.11)
RDW: 14.1 % (ref 11.5–15.5)
WBC: 42.9 10*3/uL — ABNORMAL HIGH (ref 4.0–10.5)
nRBC: 0.2 % (ref 0.0–0.2)

## 2022-01-28 LAB — MAGNESIUM
Magnesium: 1.9 mg/dL (ref 1.7–2.4)
Magnesium: 1.8 mg/dL (ref 1.7–2.4)

## 2022-01-28 LAB — GLUCOSE, CAPILLARY
Glucose-Capillary: 123 mg/dL — ABNORMAL HIGH (ref 70–99)
Glucose-Capillary: 116 mg/dL — ABNORMAL HIGH (ref 70–99)
Glucose-Capillary: 124 mg/dL — ABNORMAL HIGH (ref 70–99)
Glucose-Capillary: 95 mg/dL (ref 70–99)
Glucose-Capillary: 99 mg/dL (ref 70–99)

## 2022-01-28 LAB — PHOSPHORUS: Phosphorus: 2.1 mg/dL — ABNORMAL LOW (ref 2.5–4.6)

## 2022-01-28 LAB — TRIGLYCERIDES: Triglycerides: 56 mg/dL (ref <150)

## 2022-01-28 MED ORDER — IPRATROPIUM-ALBUTEROL 0.5-2.5 (3) MG/3ML IN SOLN: 3.0000 mL | Freq: Four times a day (QID) | RESPIRATORY_TRACT | Status: DC | PRN

## 2022-01-28 MED ORDER — NOREPINEPHRINE 4 MG/250ML-% IV SOLN
0.0000 ug/min | INTRAVENOUS | Status: DC
  Administered 2022-01-29: 4 ug/min via INTRAVENOUS
  Filled 2022-01-28: qty 250

## 2022-01-28 MED ORDER — LACTATED RINGERS IV BOLUS
750.0000 mL | Freq: Once | INTRAVENOUS | Status: AC
  Administered 2022-01-28: 750 mL via INTRAVENOUS

## 2022-01-28 MED ORDER — POTASSIUM PHOSPHATES 15 MMOLE/5ML IV SOLN
15.0000 mmol | Freq: Once | INTRAVENOUS | Status: AC
  Administered 2022-01-28: 15 mmol via INTRAVENOUS
  Filled 2022-01-28: qty 5

## 2022-01-28 MED ORDER — AMIODARONE IV BOLUS ONLY 150 MG/100ML
150.0000 mg | Freq: Once | INTRAVENOUS | Status: AC
  Administered 2022-01-28: 150 mg via INTRAVENOUS
  Filled 2022-01-28: qty 100

## 2022-01-28 MED ORDER — PHENYLEPHRINE HCL-NACL 20-0.9 MG/250ML-% IV SOLN: 0.0000 ug/min | INTRAVENOUS | Status: DC

## 2022-01-28 MED ORDER — PANTOPRAZOLE 2 MG/ML SUSPENSION
40.0000 mg | Freq: Every day | ORAL | Status: DC
  Administered 2022-01-28 – 2022-02-07 (×10): 40 mg
  Filled 2022-01-28 (×12): qty 20

## 2022-01-28 MED ORDER — TRAVASOL 10 % IV SOLN
INTRAVENOUS | Status: AC
  Filled 2022-01-28: qty 924

## 2022-01-28 MED ORDER — ENOXAPARIN SODIUM 40 MG/0.4ML IJ SOSY
40.0000 mg | PREFILLED_SYRINGE | INTRAMUSCULAR | Status: DC
Start: 2022-01-28 — End: 2022-02-14
  Administered 2022-01-28 – 2022-02-13 (×17): 40 mg via SUBCUTANEOUS
  Filled 2022-01-28 (×17): qty 0.4

## 2022-01-28 NOTE — Progress Notes (Signed)
Brief Palliative Medicine Progress Note: ? ?Chart reviewed. Noted PMT consult had been discontinued. ? ?Discussed with Salvadore Dom, NP - since goals are clear, no PMT needs at this time.  ? ?Please re-consult if any needs arise. ? ?Thank you for allowing PMT to assist in the care of this patient. ? ?[employer] Tamala Julian, FNP-BC ?Palliative Medicine Team ?Team Phone: [phone] ?NO CHARGE ? ?

## 2022-01-28 NOTE — Progress Notes (Signed)
eLink Physician-Brief Progress Note ?Patient Name: [name] [name] ?DOB: 1951/08/15 ?MRN: [id number] ? ? ?Date of Service ? 01/28/2022  ?HPI/Events of Note ? Back in AFIB with RVR - Ventricular rate episodically up into the 140's. Currently on a small dose of Norepinephrine IV infusion for hemodynamic support.   ?eICU Interventions ? Plan: ?Bolus with Amiodarone 150 mg IV over 10 minutes. ?BMP and Mg++ level STAT.   ? ? ? ?Intervention Category ?Major Interventions: Arrhythmia - evaluation and management ? ?Armanda Forand Cornelia Copa ?01/28/2022, 9:54 PM ?

## 2022-01-28 NOTE — Progress Notes (Signed)
Physical Therapy Treatment ?Patient Details ?Name: [name] [name] ?MRN: [id number] ?DOB: 03/19/1951 ?Today's Date: 01/28/2022 ? ? ?History of Present Illness 71 yo female with 1 day of worsening abdominal pain and vomiting, s/p femoral hernia repair with SBR 2/2 gangrene on 3/23. Pt with NGT to suction. Noted poor po intake for about 6 months due to living situation. Daughter reports pt  with wt loss of 20-30# within past few months. 4/1 Transferred to medical ICU for aspiration requiring intubation. Significant PMH: Parkinson disease, anxiety ? ?  ?PT Comments  ? ? Pt agreeable to therapy, daughters in room encouraging. Pt requiring AAROM today but able to initiate all movements. May need to reevaluate discharge location if pt remains intubated. PT will continue to follow acutely. ?  ?Recommendations for follow up therapy are one component of a multi-disciplinary discharge planning process, led by the attending physician.  Recommendations may be updated based on patient status, additional functional criteria and insurance authorization. ? ?Follow Up Recommendations ? Skilled nursing-short term rehab (<3 hours/day) ?  ?  ?Assistance Recommended at Discharge Frequent or constant Supervision/Assistance  ?Patient can return home with the following A lot of help with walking and/or transfers;A lot of help with bathing/dressing/bathroom ?  ?Equipment Recommendations ? Wheelchair cushion (measurements PT);Wheelchair (measurements PT)  ?  ?Recommendations for Other Services   ? ? ?  ?Precautions / Restrictions Precautions ?Precautions: Fall ?Precaution Comments: watch HR ?Restrictions ?Weight Bearing Restrictions: No  ?  ? ?   ?   ?Cognition Arousal/Alertness: Awake/alert ?Behavior During Therapy: [facility] for tasks assessed/performed ?Overall Cognitive Status: Within Functional Limits for tasks assessed ?  ?  ?  ?  ?  ?  ?  ?  ?  ?  ?  ?  ?  ?  ?  ?  ?  ?  ?  ? ?  ?Exercises General Exercises - Upper Extremity ?Elbow Flexion:  AAROM, Both, 5 reps, Supine ?Elbow Extension: AAROM, Both, 5 reps, Supine ?Wrist Flexion: AAROM, Both, 5 reps ?Wrist Extension: AAROM, Both, 5 reps ?Digit Composite Flexion: AAROM, Both, 5 reps ?Composite Extension: AAROM, Both, 5 reps, Supine ?General Exercises - Lower Extremity ?Ankle Circles/Pumps: AROM, Both, 10 reps, Supine ?Heel Slides: AAROM, Both, 5 reps, Supine ?Hip ABduction/ADduction: Both, AAROM, 5 reps, Supine ?Straight Leg Raises: AAROM, Both, 5 reps, Supine ? ?  ?General Comments General comments (skin integrity, edema, etc.): Pt ventilated, PVRC PEEp 5 FiO2 60%O2, SpO2 96%O2, VSS ?  ?  ? ?Pertinent Vitals/Pain Pain Assessment ?Pain Assessment: Faces ?Faces Pain Scale: Hurts a little bit ?Pain Location: stomach ?Pain Descriptors / Indicators: Pressure, Other (Comment) (bloating) ?Pain Intervention(s): Limited activity within patient's tolerance, Monitored during session, Repositioned  ? ? ? ?PT Goals (current goals can now be found in the care plan section) Acute Rehab PT Goals ?PT Goal Formulation: With patient/family ?Time For Goal Achievement: 02/02/22 ?Potential to Achieve Goals: Fair ?Progress towards PT goals: Not progressing toward goals - comment (recent intubation) ? ?  ?Frequency ? ? ? Min 3X/week ? ? ? ?  ?PT Plan Current plan remains appropriate  ? ? ?   ?AM-PAC PT "6 Clicks" Mobility   ?Outcome Measure ? Help needed turning from your back to your side while in a flat bed without using bedrails?: Total ?Help needed moving from lying on your back to sitting on the side of a flat bed without using bedrails?: Total ?Help needed moving to and from a bed to a chair (including a wheelchair)?: Total ?  Help needed standing up from a chair using your arms (e.g., wheelchair or bedside chair)?: Total ?Help needed to walk in hospital room?: Total ?Help needed climbing 3-5 steps with a railing? : Total ?6 Click Score: 6 ? ?  ?End of Session Equipment Utilized During Treatment: Gait belt ?Activity  Tolerance: Treatment limited secondary to medical complications (Comment) ?Patient left: with call bell/phone within reach;with family/visitor present;in bed ?Nurse Communication: Mobility status ?PT Visit Diagnosis: Muscle weakness (generalized) (M62.81);Difficulty in walking, not elsewhere classified (R26.2) ?  ? ? ?Time: 2763-9432 ?PT Time Calculation (min) (ACUTE ONLY): 18 min ? ?Charges:  $Therapeutic Exercise: 8-22 mins          ?          ? ?Berdene Askari B. Migdalia Dk PT, DPT ?Acute Rehabilitation Services ?Pager [phone] ?Office [phone] ? ? ? ?[place] ?01/28/2022, 10:23 AM ? ?

## 2022-01-28 NOTE — Progress Notes (Signed)
PHARMACY - TOTAL PARENTERAL NUTRITION CONSULT NOTE  ? ?Indication: Prolonged ileus ? ?Patient Measurements: ?Height: '5\' 7"'$  (170.2 cm) ?Weight: 49.6 kg (109 lb 5.6 oz) ?IBW/kg (Calculated) : 61.6 ?TPN AdjBW (KG): 43.4 ?Body mass index is 17.13 kg/m?. ?Usual Weight: ~115 lbs ? ?Assessment:  ?71 y.o. F s/p femoral hernia repair with SBR 2/2 gangrene on 3/23. Pt with NGT to suction. Noted poor po intake for about 6 months due to living situation. Daughter reports pt UBW ~115 lbs with wt loss of 20-30 lbs within past few months. No flatus or BM. Pharmacy consulted to start TPN for ileus in pt with severe PCM upon admission. ? ?4/01 - patient with worsening respiratory failure, moved to the ICU and required intubation ? ?Glucose / Insulin: CBGs controlled on no meds ?Electrolytes: Na down 131, Cl 106, corrected Ca ~8.9 (using alb from 3/30), Phos down 2.1, Mag down 1.9, others wnl   ?Renal: SCr stable WNL, BUN 29 (trending down) ?Hepatic: LFTs / Tbili / TG WNL, albumin 2.7 (3/30) ?Intake / Output; MIVF: UOP unmeasured; + flatus, last stool charted 3/30 - NGT removed 3/29. ?Current MIVF: LR at 100 ml/hr ? ?GI Imaging: ?3/23 Incarcerated left inguinal small-bowel hernia with high-grade small bowel obstruction. ?3/27 KUB - suggestive of postoperative ileus ?GI Surgeries / Procedures:  ?3/23 femoral hernia repair with SBR ? ?Central access: PICC placed 3/25  ?TPN start date: 01/19/22 ? ?Nutritional Goals: ?Goal TPN rate is 70 mL/hr (provides 92 g of protein and 1645 kcals per day) ? ?RD Assessment: ?Estimated Needs ?Total Energy Estimated Needs: 1600 - 1800 ?Total Protein Estimated Needs: 80 - 95 grams ?Total Fluid Estimated Needs: >/= 1.6 L ? ?Current Nutrition:  ?FLD started 3/31 ?Ensure Enlive BID - all doses charted yesterday ?TPN at 70 ml/hr - providing 100% of needs ? ?Plan:  ?Continue TPN at goal rate of 70 mL/hr at 1800  ?Electrolytes in TPN: Na 90 mEq/L, K 50 mEq/L, Mag 7 mEq/L, increase Phos 25 mmol/L (nearing max),  Ca 3 mEq/L; Cl:Ac to 2:1 ?Add standard MVI and trace elements to TPN ?S/p thiamine '100mg'$  daily x 7 days (3/25>>3/31) for refeeding ?Monitor standard TPN labs Mon/Thurs and PRN ?Monitor Surgery team plans ?Follow-up for progress - consider cycling if BG will allow ? ?KPhos 15 mmol IV x1 ?Repeating labs in am ? ?Thank you for allowing pharmacy to be a part of this patient?s care. ? ?Sloan Leiter, PharmD, BCPS, BCCCP ?Clinical Pharmacist ?Please refer to [facility] for [place] numbers ?01/28/2022, 11:40 AM ? ? ? ?

## 2022-01-28 NOTE — Progress Notes (Signed)
Nutrition Follow-up ? ?DOCUMENTATION CODES:  ? ?Severe malnutrition in context of social or environmental circumstances, Underweight ? ?INTERVENTION:  ? ?Continue TPN to meet 100% of estimated needs. ? ?Consider trial of trickle feedings with Vital AF 1.2 at 20 ml/h via NG tube. ? ?NUTRITION DIAGNOSIS:  ? ?Severe Malnutrition related to social / environmental circumstances as evidenced by severe muscle depletion, severe fat depletion, percent weight loss. ? ?Ongoing  ? ?GOAL:  ? ?Patient will meet greater than or equal to 90% of their needs ? ?Met with TPN. ? ?MONITOR:  ? ?Diet advancement, Labs, Weight trends ? ?REASON FOR ASSESSMENT:  ? ?Malnutrition Screening Tool ?  ? ?ASSESSMENT:  ? ?71 y.o. female presented to the ED with abdominal pain with vomiting and constipation for 7 days. PMH includes Parkinson disease and GERD. Pt admitted with incarcerated left inguinal hernia w/ obstructions. ? ?3/23 S/P resection of necrotic small bowel. ?3/25 TPN initiated d/t post-op ileus.  ?3/29 NG tube removed.  ?3/30 Started on clear liquids. ?3/31 Advanced to full liquids. ?4/1 Transferred to the ICU and intubated. NG tube was replaced. ? ?Patient is currently intubated on ventilator support ?MV: 9 L/min ?Temp (24hrs), Avg:99.2 ?F (37.3 ?C), Min:98.5 ?F (36.9 ?C), Max:99.5 ?F (37.5 ?C) ? ? ?No NG tube output documented x 24 hours.  ? ?Patient continues to receive TPN. Currently infusing at 70 ml/h to provide 1645 kcal and 92 gm protein daily.  ? ?Labs reviewed. Na 131, phos 2.1 ?CBG: 116-124 ? ?Medications reviewed and include vitamin C, Colace, Miralax, Senokot-S, fentanyl, Levophed, Neo-synephrine, potassium phosphate. ? ?Admission weight 53.1 kg ?Current weight 49.6 kg ? ? ?Diet Order:   ?Diet Order   ? ?       ?  Diet NPO time specified  Diet effective now       ?  ? ?  ?  ? ?  ? ? ?EDUCATION NEEDS:  ? ?No education needs have been identified at this time ? ?Skin:  Skin Assessment: Reviewed RN Assessment ?Skin Integrity  Issues:: Stage I ?Stage I: coccyx ? ?Last BM:  3/31 type 1 ? ?Height:  ? ?Ht Readings from Last 1 Encounters:  ?01/28/22 $RemoveBe'5\' 7"'hlJWtNPhs$  (1.702 m)  ? ? ?Weight:  ? ?Wt Readings from Last 1 Encounters:  ?01/27/22 49.6 kg  ? ? ?Ideal Body Weight:  61.4 kg ? ?BMI:  Body mass index is 17.13 kg/m?. ? ?Estimated Nutritional Needs:  ? ?Kcal:  1600 - 1800 ? ?Protein:  80 - 95 grams ? ?Fluid:  >/= 1.6 L ? ? ? ?Lucas Mallow RD, LDN, CNSC ?Please refer to [facility] for contact information.                                                       ? ?

## 2022-01-28 NOTE — Progress Notes (Signed)
eLink Physician-Brief Progress Note ?Patient Name: [name] [name] ?DOB: 03/19/1951 ?MRN: [id number] ? ? ?Date of Service ? 01/28/2022  ?HPI/Events of Note ? Hypomagnesemia - Mg++ = 1.8 and Creatinine = 0.43.   ?eICU Interventions ? Will replace Mg++.   ? ? ? ?Intervention Category ?Major Interventions: Electrolyte abnormality - evaluation and management ? ?[place] Delio Cornelia Copa ?01/28/2022, 11:28 PM ?

## 2022-01-28 NOTE — Progress Notes (Addendum)
? ?NAME:  [name] [name], MRN:  [id number], DOB:  12/22/1950, LOS: 68 ?ADMISSION DATE:  01/17/2022, CONSULTATION DATE:  01/26/22 ?REFERRING MD:  Vance Gather, MD CHIEF COMPLAINT:  Aspiration, AHRF  ? ?History of Present Illness:  ?71 year old female with Parkinson's disease and anxiety who presented with abdominal pain and admitted for incarcerated left femoral hernia s/p resection of necrotic small bowel 3/23. Hospital course complicated by postop ileus on TPN. She was started on diet earlier this week. Last night had an episode of emesis. PCCM called to bedside this am for worsening hypoxemia, diaphoresis and respiratory distress. CXR with right middle and lower lobe infiltrate concerning for aspiration. She is tachycardic to 150s, normotensive, SpO2 75% on NRB. ? ?Pertinent  Medical History  ?Parkinson's disease and anxiety ? ?Significant Hospital Events: ?Including procedures, antibiotic start and stop dates in addition to other pertinent events   ?3/23 incarcerated left femoral hernia s/p resection of necrotic small bowel  ?4/1 Transferred to medical ICU for aspiration requiring intubation ?4/2 CT abd completed. Surgery felt fluid finding c/w old hematoma and not anastomotic leak. Recommended on-going wound care, abx and NPO status. Pressors for septic shock. Abx widened for aspiration  ?4/3 weaning pressors and FIO2 ? ?Interim History / Subjective:  ? ?No issues over night  ?Objective   ?Blood pressure 116/62, pulse 94, temperature 98.5 ?F (36.9 ?C), temperature source Oral, resp. rate 18, height '5\' 7"'$  (1.702 m), weight 49.6 kg, SpO2 96 %. ?CVP:  [7 mmHg-11 mmHg] 7 mmHg  ?Vent Mode: PRVC ?FiO2 (%):  [60 %-100 %] 60 % ?Set Rate:  [18 bmp] 18 bmp ?Vt Set:  [490 mL] 490 mL ?PEEP:  [5 cmH20] 5 cmH20 ?Plateau Pressure:  [13 cmH20-19 cmH20] 14 cmH20  ? ?Intake/Output Summary (Last 24 hours) at 01/28/2022 1138 ?Last data filed at 01/28/2022 0900 ?Gross per 24 hour  ?Intake 3758.5 ml  ?Output 1695 ml  ?Net 2063.5 ml  ? ?Filed  Weights  ? 01/25/22 0450 01/26/22 0630 01/27/22 0500  ?Weight: 46.8 kg 48.9 kg 49.6 kg  ? ?Physical Exam: ?General deconditioned 70 yof sedated  ?HENT NCAT orally intubated ?Pulm coarse rhonchi R>L ?Card rrr ?Ad soft dressing intact ?Ext no sig edema ?Neuro sedated ?GU cl yellow  ? ?Resolved Hospital Problem list   ? ? ?Assessment & Plan:  ?Principal Problem: ?  Femoral hernia of left side with obstruction ?Active Problems: ?  Parkinson disease ([place]) ?  Anxiety ?  Failure to thrive in adult ?  Goals of care, counseling/discussion ?  Pressure injury of coccygeal region, stage 1 ?  Incarcerated hernia ?  Aspiration pneumonia ([employer]) ?  Septic shock ([place]) ?  Acute respiratory failure with hypoxia ([place]) ? ? ?Septic shock secondary to aspiration pneumonia +/- abdominal wound leak vs localized wound infection  ?Plan ?Wean off Neo, then norepi ?CVP goal 8-12-->will give 750 ML bolus ?Abx zosyn (widened 4/2: Length of therapy TBD) ?Hold antihypertensives ? ?Acute hypoxemic respiratory failure secondary to aspiration pneumonia/pneumonitis ?Plan ?Cont full vent support ?Wean FIO2 ?PAD protocol RASS goal -2 ?VAP bundle ?Am cxr ?Abx as above ? ?Femoral hernia of left side with obstruction with post-op ileus ?s/p resection 01/17/2022.  4/2 Surgery concerned for wound leak ?Plan ?Cont localized wound care ?Cont abx (zosyn for both aspiration and abd) ?NPO ?NGT drainage w/ LIWS ?  ?Failure to thrive in adult ?- Severely malnourished baseline clearly predates her acute illness. BMI is < 15.  ?- Palliative involved. Full code confirmed  by patient and family. Goal is to "get well" back to PLOF.  ?Plan ?Needs SNF when stable off vent ?  ?Anxiety ?- Has been on buspar '5mg'$  BID, valium '2mg'$  (though this was prn vertigo).  ?Plan ?Seroquel and remeron  ? ?Parkinson disease ([place]) ?Plan ?Sinemet  ? ?Pressure injury of coccygeal region, stage 1 ?POA, ?Plan ?Pressure relief interventions ?Optimize nutrition ? ? ?Best Practice (right click and  "Reselect all SmartList Selections" daily)  ? ?Diet/type: NPO and TPN ?DVT prophylaxis: LMWH ?GI prophylaxis: N/A ?Lines: N/A ?Foley:  N/A ?Code Status:  full code ?Last date of multidisciplinary goals of care discussion [4/2] Updated daughter at bedside ? ?My cct 34 min ? ?Erick Colace ACNP-BC ?[place] ?Pager # [phone] OR # [phone] if no answer ? ? ?

## 2022-01-28 NOTE — Progress Notes (Addendum)
Pt's daughter Waldemar Dickens) at bedside requested that palliative consult be discontinued. Pt's daughter feels like consult is not needed at this time. ?

## 2022-01-28 NOTE — Progress Notes (Addendum)
11 Days Post-Op  ? ?Subjective/Chief Complaint: ?Patient on vent this morning.  Grabbing at my hands.  On 70 mcg of neo and 6 mcg of levo.  Cardizem for a fib rvr yesterday.  Convert back to NSR.  Patient opens eyes to name and interacts with [place]. ? ?Objective: ?Vital signs in last 24 hours: ?Temp:  [99 ?F (37.2 ?C)-100.8 ?F (38.2 ?C)] 99 ?F (37.2 ?C) (04/03 0830) ?Pulse Rate:  [72-176] 84 (04/03 0830) ?Resp:  [16-29] 18 (04/03 0830) ?BP: (68-134)/(49-83) 109/55 (04/03 0830) ?SpO2:  [86 %-100 %] 98 % (04/03 0830) ?FiO2 (%):  [60 %-100 %] 60 % (04/03 0740) ?Last BM Date : 01/25/22 ? ?Intake/Output from previous day: ?04/02 0701 - 04/03 0700 ?In: 4145.6 [I.V.:3958.1; IV Piggyback:187.5] ?Out: 1695 [OEVOJ:5009] ?Intake/Output this shift: ?No intake/output data recorded. ? ?PE: ?Gen: Frail appearing female on vent ?Heart: regular ?Lungs: on vent ?Abd: soft, moderate distention, LLQ incision with copious foul smelling brown drainage still today, but slowing down slightly.   NGT cannister with minimal output currently. No output from NGT documented in last 24 hours. ? ?Lab Results:  ?Recent Labs  ?  01/27/22 ?0408 01/28/22 ?0304  ?WBC 48.6* 42.9*  ?HGB 9.6* 8.2*  ?HCT 29.0* 24.8*  ?PLT 174 159  ? ?BMET ?Recent Labs  ?  01/27/22 ?0408 01/28/22 ?0304  ?NA 135 131*  ?K 5.0 3.9  ?CL 108 106  ?CO2 23 20*  ?GLUCOSE 154* 133*  ?BUN 29* 20  ?CREATININE 0.62 0.39*  ?CALCIUM 7.9* 7.7*  ? ?PT/INR ?No results for input(s): LABPROT, INR in the last 72 hours. ?ABG ?Recent Labs  ?  01/26/22 ?1616  ?PHART 7.316*  ?HCO3 20.6  ? ? ?Studies/Results: ?CT CHEST ABDOMEN PELVIS W CONTRAST ? ?Result Date: 01/27/2022 ?CLINICAL DATA:  Sepsis. Aspiration pneumonia. Evaluate for small bowel anastomotic leak versus draining infected hematoma from left femoral hernia repair site. EXAM: CT CHEST, ABDOMEN, AND PELVIS WITH CONTRAST TECHNIQUE: Multidetector CT imaging of the chest, abdomen and pelvis was performed following the standard protocol during bolus  administration of intravenous contrast. RADIATION DOSE REDUCTION: This exam was performed according to the departmental dose-optimization program which includes automated exposure control, adjustment of the mA and/or kV according to patient size and/or use of iterative reconstruction technique. CONTRAST:  183m OMNIPAQUE IOHEXOL 300 MG/ML  SOLN COMPARISON:  01/17/2022. FINDINGS: CT CHEST FINDINGS Cardiovascular: Mild cardiac enlargement. No pericardial effusion. The main pulmonary artery appears patent. No signs of central obstructing embolus. Mediastinum/Nodes: ETT tip is above the carina. There is a nasogastric tube in place with tip in the proximal body of stomach. No enlarged axillary, supraclavicular, mediastinal, or hilar lymph nodes. Lungs/Pleura: Small right pleural effusion. Large area of dense airspace consolidation with surrounding ground-glass attenuation is noted involving the posterior right upper lobe, superior segment of right lower lobe and posterior mid and basilar right lower lobe. Ground-glass attenuation with mild patchy airspace densities noted within the right middle lobe. Dense airspace disease is also identified within the left lower lobe. Subsegmental atelectasis and ground-glass attenuation noted within the lingula. Musculoskeletal: No chest wall mass or suspicious bone lesions identified. CT ABDOMEN PELVIS FINDINGS Hepatobiliary: Multiple benign liver hemangiomas are again noted involving both lobes of liver. The largest hemangioma is in the periphery of the right lobe measuring 3.8 cm and appears unchanged. No new focal liver abnormality. The gallbladder appears normal for degree of distension. No signs of bile duct dilatation. Pancreas: Unremarkable. No pancreatic ductal dilatation or surrounding inflammatory changes. Spleen:  Normal in size without focal abnormality. Adrenals/Urinary Tract: Normal adrenal glands. The right kidney appears normal. Benign Bosniak class 1 cyst within the  left kidney is unchanged. No follow-up recommended. No nephrolithiasis or hydronephrosis identified bilaterally. Urinary bladder is decompressed around a Foley catheter balloon. Stomach/Bowel: Stomach appears nondistended. The appendix is visualized and appears normal. Complex postoperative anatomy of the small bowel loops identified status post left inguinal hernia repair with small bowel resection and reanastomosis. Evaluation for small bowel pathology, including anastomotic dehiscence, is limited due to lack of oral contrast material. There is a dilated loop of small bowel which leads up to the anastomotic suture chain measuring 4 cm in diameter, image 32/6 and image 44/6. This may represent postoperative ileus or small-bowel obstruction secondary to stenosis at the anastomosis. Vascular/Lymphatic: No significant vascular findings are present. The upper abdominal vascularity appears patent. There is an enlarged fibroid uterus no enlarged abdominal or pelvic lymph nodes. Reproductive: Which appears partially calcified as before. Other: Free fluid identified within the abdomen and pelvis, similar in volume to the previous exam. Poorly defined complex heterogeneous mass in the left inguinal canal is identified measuring 4.0 x 2.9 by 5.7 cm. This is contiguous with peripherally enhancing fluid collections which extend along the fascial planes of the left lower quadrant ventral abdominal wall, image 41/6. There is an open wound within the left lower quadrant ventral abdominal wall which communicates with fistulous communication with the fluid collection tracking between the fascial layers of the left lower quadrant abdominal wall and left inguinal canal, image 105/3. Musculoskeletal: No acute or significant osseous findings. IMPRESSION: 1. Status post left inguinal hernia repair with small bowel resection and reanastomosis. Evaluation for small bowel pathology, including anastomotic dehiscence, is limited due to lack  of oral contrast material. 2. There is a dilated loop of small bowel which leads up to the anastomotic suture chain. This may represent postoperative ileus or small-bowel obstruction secondary to stenosis at the anastomosis. More definitive characterization with repeat CT of the abdomen pelvis with IV and oral contrast material may be helpful to assess the integrity of the anastomosis and differentiate between postop ileus versus obstruction. 3. Complex mass within the left inguinal canal is identified. This may represent postoperative hematoma, abscess or recurrent small bowel herniation. This communicates with fluid collections tracking between the fascial layers of the left lower quadrant ventral abdominal wall. 4. Left lower quadrant cutaneous fistula communicates with fluid tracking within the fascial layers of the left lower quadrant abdominal wall concerning for abscess. 5. Severe multi lobar airspace consolidation with surrounding ground-glass attenuation is identified within both lungs compatible with multifocal pneumonia. 6. Small right pleural effusion. 7. Multiple benign liver hemangiomas. 8. Enlarged fibroid uterus. Electronically Signed   By: Kerby Moors M.D.   On: 01/27/2022 14:21  ? ?DG Chest Port 1 View ? ?Result Date: 01/27/2022 ?CLINICAL DATA:  Acute respiratory failure EXAM: PORTABLE CHEST 1 VIEW COMPARISON:  01/26/2022 FINDINGS: Endotracheal tube with tip approximately 1.6 cm from the carina. Right arm PICC tip in the SVC. Left subclavian line with tip somewhat retracted, approximating the distal SVC. Enteric tube tip and side port are below the GE junction. Redemonstrated diffuse airspace disease throughout the right lung, which appears slightly improved compared to the prior exam. Left lower lobe atelectasis with suspected trace left pleural effusion, similar to prior. No acute osseous abnormality. IMPRESSION: 1. Endotracheal tube tip approximately 1.6 cm from the carina; consider slight  retraction (1-2 cm). 2. Slight retraction of  the left subclavian line, with tip approximating the distal SVC. 3. Slightly improved diffuse airspace opacities throughout the right lung. Electronically [employer]

## 2022-01-28 NOTE — Progress Notes (Signed)
SLP Cancellation Note ? ?Patient Details ?Name: [name] [name] ?MRN: [id number] ?DOB: 11-10-1950 ? ? ?Cancelled treatment:       Reason Eval/Treat Not Completed: Patient not medically ready ? ? ?[place] Pizzimenti, Katherene Ponto ?01/28/2022, 8:02 AM ?

## 2022-01-29 ENCOUNTER — Inpatient Hospital Stay (HOSPITAL_COMMUNITY): Payer: Medicare HMO

## 2022-01-29 DIAGNOSIS — J9601 Acute respiratory failure with hypoxia: Secondary | ICD-10-CM | POA: Diagnosis not present

## 2022-01-29 DIAGNOSIS — K4130 Unilateral femoral hernia, with obstruction, without gangrene, not specified as recurrent: Secondary | ICD-10-CM | POA: Diagnosis not present

## 2022-01-29 DIAGNOSIS — J690 Pneumonitis due to inhalation of food and vomit: Secondary | ICD-10-CM | POA: Diagnosis not present

## 2022-01-29 DIAGNOSIS — R339 Retention of urine, unspecified: Secondary | ICD-10-CM

## 2022-01-29 LAB — CBC
HCT: 21.6 % — ABNORMAL LOW (ref 36.0–46.0)
Hemoglobin: 7.4 g/dL — ABNORMAL LOW (ref 12.0–15.0)
MCH: 31 pg (ref 26.0–34.0)
MCHC: 34.3 g/dL (ref 30.0–36.0)
MCV: 90.4 fL (ref 80.0–100.0)
Platelets: 140 10*3/uL — ABNORMAL LOW (ref 150–400)
RBC: 2.39 MIL/uL — ABNORMAL LOW (ref 3.87–5.11)
RDW: 14.2 % (ref 11.5–15.5)
WBC: 29.3 10*3/uL — ABNORMAL HIGH (ref 4.0–10.5)
nRBC: 0.4 % — ABNORMAL HIGH (ref 0.0–0.2)

## 2022-01-29 LAB — BASIC METABOLIC PANEL
Anion gap: 6 (ref 5–15)
BUN: 18 mg/dL (ref 8–23)
CO2: 23 mmol/L (ref 22–32)
Calcium: 8.1 mg/dL — ABNORMAL LOW (ref 8.9–10.3)
Chloride: 104 mmol/L (ref 98–111)
Creatinine, Ser: 0.46 mg/dL (ref 0.44–1.00)
GFR, Estimated: >60 mL/min (ref >60)
Glucose, Bld: 113 mg/dL — ABNORMAL HIGH (ref 70–99)
Potassium: 3.9 mmol/L (ref 3.5–5.1)
Sodium: 133 mmol/L — ABNORMAL LOW (ref 135–145)

## 2022-01-29 LAB — GLUCOSE, CAPILLARY
Glucose-Capillary: 116 mg/dL — ABNORMAL HIGH (ref 70–99)
Glucose-Capillary: 99 mg/dL (ref 70–99)
Glucose-Capillary: 103 mg/dL — ABNORMAL HIGH (ref 70–99)
Glucose-Capillary: 101 mg/dL — ABNORMAL HIGH (ref 70–99)
Glucose-Capillary: 103 mg/dL — ABNORMAL HIGH (ref 70–99)
Glucose-Capillary: 114 mg/dL — ABNORMAL HIGH (ref 70–99)

## 2022-01-29 LAB — PHOSPHORUS: Phosphorus: 2.8 mg/dL (ref 2.5–4.6)

## 2022-01-29 LAB — MAGNESIUM: Magnesium: 2 mg/dL (ref 1.7–2.4)

## 2022-01-29 MED ORDER — MELATONIN 3 MG PO TABS
3.0000 mg | ORAL_TABLET | Freq: Every day | ORAL | Status: DC
  Administered 2022-01-29 – 2022-02-07 (×9): 3 mg
  Filled 2022-01-29 (×9): qty 1

## 2022-01-29 MED ORDER — MAGNESIUM SULFATE IN D5W 1-5 GM/100ML-% IV SOLN
1.0000 g | Freq: Once | INTRAVENOUS | Status: AC
  Administered 2022-01-29: 1 g via INTRAVENOUS
  Filled 2022-01-29: qty 100

## 2022-01-29 MED ORDER — VITAL AF 1.2 CAL PO LIQD
1000.0000 mL | ORAL | Status: DC
  Administered 2022-01-29: 1000 mL

## 2022-01-29 MED ORDER — MIDAZOLAM HCL 2 MG/2ML IJ SOLN: 0.5000 mg | INTRAMUSCULAR | Status: DC | PRN

## 2022-01-29 MED ORDER — POTASSIUM CHLORIDE 20 MEQ PO PACK
40.0000 meq | PACK | Freq: Once | ORAL | Status: AC
  Administered 2022-01-29: 40 meq
  Filled 2022-01-29: qty 2

## 2022-01-29 MED ORDER — TRACE MINERALS CU-MN-SE-ZN 300-55-60-3000 MCG/ML IV SOLN
INTRAVENOUS | Status: AC
  Filled 2022-01-29: qty 616

## 2022-01-29 MED ORDER — DOCUSATE SODIUM 50 MG/5ML PO LIQD: 100.0000 mg | Freq: Two times a day (BID) | ORAL | Status: DC

## 2022-01-29 MED ORDER — FUROSEMIDE 10 MG/ML IJ SOLN
40.0000 mg | Freq: Once | INTRAMUSCULAR | Status: AC
  Administered 2022-01-29: 40 mg via INTRAVENOUS
  Filled 2022-01-29: qty 4

## 2022-01-29 MED ORDER — FENTANYL 2500MCG IN NS 250ML (10MCG/ML) PREMIX INFUSION
25.0000 ug/h | INTRAVENOUS | Status: DC
  Administered 2022-01-30: 25 ug/h via INTRAVENOUS
  Administered 2022-01-30 – 2022-01-31 (×2): 50 ug/h via INTRAVENOUS
  Filled 2022-01-29 (×2): qty 250

## 2022-01-29 MED ORDER — POTASSIUM CHLORIDE 20 MEQ PO PACK
20.0000 meq | PACK | Freq: Once | ORAL | Status: AC
  Administered 2022-01-29: 20 meq
  Filled 2022-01-29: qty 1

## 2022-01-29 MED ORDER — POLYETHYLENE GLYCOL 3350 17 G PO PACK: 17.0000 g | PACK | Freq: Every day | ORAL | Status: DC

## 2022-01-29 MED ORDER — FENTANYL CITRATE (PF) 100 MCG/2ML IJ SOLN
12.5000 ug | INTRAMUSCULAR | Status: DC | PRN
  Administered 2022-01-31 – 2022-02-01 (×2): 12.5 ug via INTRAVENOUS

## 2022-01-29 MED ORDER — VITAL HIGH PROTEIN PO LIQD: 1000.0000 mL | ORAL | Status: DC

## 2022-01-29 NOTE — Progress Notes (Signed)
SLP Cancellation Note ? ?Patient Details ?Name: [name] [name] ?MRN: [id number] ?DOB: 02/05/1951 ? ? ?Cancelled treatment:       Reason Eval/Treat Not Completed: Medical issues which prohibited therapy;Other (comment) (patient continues to be intubated. SLP will follow for readiness.) ? ? ?Sonia Baller, MA, CCC-SLP ?Speech Therapy ? ?

## 2022-01-29 NOTE — Progress Notes (Signed)
? ?NAME:  [name] [name], MRN:  [id number], DOB:  04/22/1951, LOS: 12 ?ADMISSION DATE:  01/17/2022, CONSULTATION DATE:  01/26/22 ?REFERRING MD:  Vance Gather, MD CHIEF COMPLAINT:  Aspiration, AHRF  ? ?History of Present Illness:  ?71 year old female with Parkinson's disease and anxiety who presented with abdominal pain and admitted for incarcerated left femoral hernia s/p resection of necrotic small bowel 3/23. Hospital course complicated by postop ileus on TPN. She was started on diet earlier this week. Last night had an episode of emesis. PCCM called to bedside this am for worsening hypoxemia, diaphoresis and respiratory distress. CXR with right middle and lower lobe infiltrate concerning for aspiration. She is tachycardic to 150s, normotensive, SpO2 75% on NRB. ? ?Pertinent  Medical History  ?Parkinson's disease and anxiety ? ?Significant Hospital Events: ?Including procedures, antibiotic start and stop dates in addition to other pertinent events   ?3/23 incarcerated left femoral hernia s/p resection of necrotic small bowel  ?4/1 Transferred to medical ICU for aspiration requiring intubation ?4/2 CT abd completed. Surgery felt fluid finding c/w old hematoma and not anastomotic leak. Recommended on-going wound care, abx and NPO status. Pressors for septic shock. Abx widened for aspiration  ?4/3 weaning pressors and FIO2 ?4/4 off pressors. Got lasix. Surg starting trickle feeds. Attempting weaning but sedation a barrier so fent gtt stopped  ? ?Interim History / Subjective:  ?Off pressors ? ?Objective   ?Blood pressure 114/84, pulse (Abnormal) 119, temperature (Abnormal) 100.6 ?F (38.1 ?C), temperature source Axillary, resp. rate 18, height '5\' 7"'$  (1.702 m), weight 57.6 kg, SpO2 98 %. ?   ?Vent Mode: PRVC ?FiO2 (%):  [40 %-60 %] 40 % ?Set Rate:  [18 bmp] 18 bmp ?Vt Set:  [490 mL] 490 mL ?PEEP:  [5 cmH20] 5 cmH20 ?Plateau Pressure:  [19 cmH20-22 cmH20] 19 cmH20  ? ?Intake/Output Summary (Last 24 hours) at 01/29/2022  1017 ?Last data filed at 01/29/2022 1000 ?Gross per 24 hour  ?Intake 3751.51 ml  ?Output 1575 ml  ?Net 2176.51 ml  ? ?Filed Weights  ? 01/26/22 0630 01/27/22 0500 01/29/22 0500  ?Weight: 48.9 kg 49.6 kg 57.6 kg  ? ?Physical Exam: ?General deconditioned. Weak. Now off pressors. Attempted SBT->volumes good but kept triggering apnea alarm  ?HENT NCAT no JVD temporal wasting ?Pulm crackles bases. No accessory use on SBT but as mentioned sedation limiting factor ?Card rrr ?Abd soft, dressing intact ?Gu clear yellow  ?Neuro very weak but interactive  ? ?Resolved Hospital Problem list   ? ? ?Assessment & Plan:  ?Principal Problem: ?  Femoral hernia of left side with obstruction ?Active Problems: ?  Parkinson disease ([place]) ?  Anxiety ?  Failure to thrive in adult ?  Goals of care, counseling/discussion ?  Pressure injury of coccygeal region, stage 1 ?  Incarcerated hernia ?  Aspiration pneumonia ([place]) ?  Septic shock ([place]) ?  Acute respiratory failure with hypoxia ([place]) ?  Urinary retention ? ? ?Septic shock secondary to aspiration pneumonia (Klebsiella oxytoca)  +/- abdominal wound leak vs localized wound infection  ?Off pressors  ?Plan ?Keep even  ?Hold antihypertensives ?Abx (day 3 zosyn. Length or rx TBD) ? ? ?Acute hypoxemic respiratory failure secondary to aspiration pneumonia/pneumonitis ?Plan ?Lasix x1 ?Decrease RASS goal to 0 ?Daily SBT and PSV as tolerated. Hope extubate soon when MS will support ?Am cxr ?VAP bundle ?Abx as above  ? ? ?Femoral hernia of left side with obstruction with post-op ileus ?s/p resection 01/17/2022.  4/2 Surgery concerned  for wound leak ?Plan ?Cont localized wound care ?Zosyn for aspiration and possible abd ?Surg trying trickle feeds ?  ?Failure to thrive in adult ?- Severely malnourished baseline clearly predates her acute illness. BMI is < 15.  ?- Palliative involved. Full code confirmed by patient and family. Goal is to "get well" back to PLOF.  ?Plan ?Needs SNF when better ?  ?Anxiety ?-  Has been on buspar '5mg'$  BID, valium '2mg'$  (though this was prn vertigo).  ?Plan ?Seroquel ->stopped remeron per family request  ? ?Parkinson disease ([place]) ?Plan ?Sinemet  ? ?Pressure injury of coccygeal region, stage 1 ?POA, ?Plan ?Pressure relief interventions ?Maximize nutrition  ? ?Urinary retention ?Plan ?May need foley back ? ?Best Practice (right click and "Reselect all SmartList Selections" daily)  ? ?Diet/type: tubefeeds and TPN ?DVT prophylaxis:  ?GI prophylaxis: N/A ?Lines: N/A ?Foley:  N/A->may need to be replaced  ?Code Status:  full code ?Last date of multidisciplinary goals of care discussion [4/2] Updated daughter at bedside ? ?My cct 34 min ? ?Erick Colace ACNP-BC ?[place] ?Pager # [phone] OR # [phone] if no answer ? ? ?

## 2022-01-29 NOTE — Progress Notes (Signed)
Occupational Therapy Treatment ?Patient Details ?Name: [name] [name] ?MRN: [id number] ?DOB: Dec 31, 1950 ?Today's Date: 01/29/2022 ? ? ?History of present illness 70 yo female with 1 day of worsening abdominal pain and vomiting, s/p femoral hernia repair with SBR 2/2 gangrene on 3/23. Pt with NGT to suction. Noted poor po intake for about 6 months due to living situation. Daughter reports pt  with wt loss of 20-30# within past few months. 4/1 Transferred to medical ICU for aspiration requiring intubation. Significant PMH: Parkinson disease, anxiety ?  ?OT comments ? Pt seen for first OT session since transfer to ICU. Pt with intermittent lethargy but much improved with weaning sedation. Family at bedside and supportive in pt's care. Emphasis on fine motor coordination, ability to express needs (pt preference for attempting notepad and pen), and AROM vs light resistance exercises (HEP, level 1 theraband and squeeze ball provided). Pt/family hopeful to be weaned from vent soon. Will continue to follow acutely. ? ?HR 120s with exercises ?SpO2 WFL on 40% FiO2 PRVC  ? ?Recommendations for follow up therapy are one component of a multi-disciplinary discharge planning process, led by the attending physician.  Recommendations may be updated based on patient status, additional functional criteria and insurance authorization. ?   ?Follow Up Recommendations ? Skilled nursing-short term rehab (<3 hours/day)  ?  ?Assistance Recommended at Discharge Frequent or constant Supervision/Assistance  ?Patient can return home with the following ? A lot of help with bathing/dressing/bathroom;A lot of help with walking and/or transfers ?  ?Equipment Recommendations ? Other (comment) (TBD pending progress)  ?  ?Recommendations for Other Services   ? ?  ?Precautions / Restrictions Precautions ?Precautions: Fall ?Precaution Comments: watch HR, vent ?Restrictions ?Weight Bearing Restrictions: No  ? ? ?  ? ?Mobility Bed Mobility ?  ?  ?  ?  ?  ?  ?   ?  ?  ? ?Transfers ?  ?  ?  ?  ?  ?  ?  ?  ?  ?  ?  ?  ?Balance   ?  ?  ?  ?  ?  ?  ?  ?  ?  ?  ?  ?  ?  ?  ?  ?  ?  ?  ?   ? ?ADL either performed or assessed with clinical judgement  ? ?ADL Overall ADL's : Needs assistance/impaired ?  ?  ?  ?  ?  ?  ?  ?  ?  ?  ?  ?  ?  ?  ?  ?  ?  ?  ?  ?General ADL Comments: Emphasis on ability to express needs while intubated with pt opting for writing on notepad. Emphasis on fine motor coordination, AROM of BUE transitioning to theraband use (handout provdied) and squeeze ball with family education at bedside ?  ? ?Extremity/Trunk Assessment Upper Extremity Assessment ?Upper Extremity Assessment: Generalized weakness ?  ?Lower Extremity Assessment ?Lower Extremity Assessment: Defer to PT evaluation ?  ?  ?  ? ?Vision   ?Vision Assessment?: No apparent visual deficits ?  ?Perception   ?  ?Praxis   ?  ? ?Cognition Arousal/Alertness: Awake/alert, Lethargic, Suspect due to medications ?Behavior During Therapy: Flat affect ?Overall Cognitive Status: Difficult to assess ?  ?  ?  ?  ?  ?  ?  ?  ?  ?  ?  ?  ?  ?  ?  ?  ?General Comments: flat affect, following directions relatively well but  perseverates on writing on notepad (illegible at times); attempted letter board, communication board and pt declined to use phone text to communicate. did demo some humor tossing notepad in direction of family members. intermittently lethargic - noted weaning sedation ?  ?  ?   ?Exercises Exercises: Other exercises ?Other Exercises ?Other Exercises: squeeze ball x 5 ?Other Exercises: horizontal abduction and elbow flexion with yellow theraband 2 reps for demo ? ?  ?Shoulder Instructions   ? ? ?  ?General Comments Pt's daughter and sister at bedside  ? ? ?Pertinent Vitals/ Pain       Pain Assessment ?Pain Assessment: Faces ?Faces Pain Scale: Hurts a little bit ?Pain Location: L UE ?Pain Descriptors / Indicators: Grimacing ?Pain Intervention(s): Repositioned ? ?Home Living   ?  ?  ?  ?  ?  ?  ?  ?   ?  ?  ?  ?  ?  ?  ?  ?  ?  ?  ? ?  ?Prior Functioning/Environment    ?  ?  ?  ?   ? ?Frequency ? Min 2X/week  ? ? ? ? ?  ?Progress Toward Goals ? ?OT Goals(current goals can now be found in the care plan section) ? Progress towards OT goals: Not progressing toward goals - comment (recently intubated, transfer to ICU) ? ?Acute Rehab OT Goals ?Patient Stated Goal: family and pt hopeful for weaning from vent, increasing strength and alertness ?OT Goal Formulation: With patient/family ?Time For Goal Achievement: 02/02/22 ?Potential to Achieve Goals: Good ?ADL Goals ?Pt Will Perform Grooming: with min guard assist;sitting ?Pt Will Perform Upper Body Bathing: with min assist;with min guard assist;sitting ?Pt Will Perform Lower Body Bathing: with mod assist;sitting/lateral leans ?Pt Will Perform Upper Body Dressing: with min assist;with min guard assist;sitting ?Pt Will Transfer to Toilet: with min assist;stand pivot transfer;bedside commode ?Pt Will Perform Toileting - Clothing Manipulation and hygiene: with max assist;with mod assist;sit to/from stand  ?Plan Discharge plan remains appropriate   ? ?Co-evaluation ? ? ?   ?  ?  ?  ?  ? ?  ?AM-PAC OT "6 Clicks" Daily Activity     ?Outcome Measure ? ? Help from another person eating meals?: A Lot ?Help from another person taking care of personal grooming?: A Lot ?Help from another person toileting, which includes using toliet, bedpan, or urinal?: Total ?Help from another person bathing (including washing, rinsing, drying)?: A Lot ?Help from another person to put on and taking off regular upper body clothing?: A Lot ?Help from another person to put on and taking off regular lower body clothing?: A Lot ?6 Click Score: 11 ? ?  ?End of Session Equipment Utilized During Treatment: Other (comment) (vent) ? ?OT Visit Diagnosis: Unsteadiness on feet (R26.81);Muscle weakness (generalized) (M62.81);Other abnormalities of gait and mobility (R26.89) ?  ?Activity Tolerance Patient  tolerated treatment well ?  ?Patient Left in bed;with call bell/phone within reach;with family/visitor present ?  ?Nurse Communication Mobility status ?  ? ?   ? ?Time: 9381-8299 ?OT Time Calculation (min): 35 min ? ?Charges: OT General Charges ?$OT Visit: 1 Visit ?OT Treatments ?$Therapeutic Activity: 8-22 mins ?$Therapeutic Exercise: 8-22 mins ? ?Malachy Chamber, OTR/L ?Acute Rehab Services ?Office: [phone]  ? ?Layla Maw ?01/29/2022, 11:47 AM ?

## 2022-01-29 NOTE — Progress Notes (Signed)
eLink Physician-Brief Progress Note ?Patient Name: [name] [name] ?DOB: November 17, 1950 ?MRN: [id number] ? ? ?Date of Service ? 01/29/2022  ?HPI/Events of Note ? Called by radiology - ETT in R mainstem bronchus.   ?eICU Interventions ? Plan: ?Withdraw ETT 6 cm and secure. ?Repeat portable CXR post reposition of ETT.   ? ? ? ?Intervention Category ?Major Interventions: Respiratory failure - evaluation and management ? ?Kaysan Peixoto Cornelia Copa ?01/29/2022, 5:19 AM ?

## 2022-01-29 NOTE — Progress Notes (Signed)
eLink Physician-Brief Progress Note ?Patient Name: [name] [name] ?DOB: [birth year] ?MRN: [id number] ? ? ?Date of Service ? 01/29/2022  ?HPI/Events of Note ? Nursing concerned about HR = 141. Patient is on a Cardizem IV infusion d/t AFIB. Currently in Sinus Tachycardia with HR = 122. K+ = 3.9, Mg++ = 2.0 and Creatinine = 0.46.  ?eICU Interventions ? Plan: ?Replace K+ and Mg++.  ? ? ? ?Intervention Category ?Major Interventions: Arrhythmia - evaluation and management ? ?Maranda Marte Cornelia Copa ?01/29/2022, 9:00 PM ?

## 2022-01-29 NOTE — Progress Notes (Signed)
PHARMACY - TOTAL PARENTERAL NUTRITION CONSULT NOTE  ? ?Indication: Prolonged ileus ? ?Patient Measurements: ?Height: '5\' 7"'$  (170.2 cm) ?Weight: 57.6 kg (126 lb 15.8 oz) ?IBW/kg (Calculated) : 61.6 ?TPN AdjBW (KG): 43.4 ?Body mass index is 19.89 kg/m?. ?Usual Weight: ~115 lbs ? ?Assessment:  ?71 y.o. F s/p femoral hernia repair with SBR 2/2 gangrene on 3/23. Pt with NGT to suction. Noted poor po intake for about 6 months due to living situation. Daughter reports pt UBW ~115 lbs with wt loss of 20-30 lbs within past few months. No flatus or BM. Pharmacy consulted to start TPN for ileus in pt with severe PCM upon admission. ? ?4/01 - patient with worsening respiratory failure, moved to the ICU and required intubation ? ?Glucose / Insulin: CBGs controlled on no meds ?Electrolytes: Na 133, K 3.9 and getting Lasix today, corrected Ca ~10.1, Phos 2.8, Mag 2- getting Lasix today, others wnl   ?Renal: SCr stable WNL, BUN 18 (trending down) ?Hepatic: LFTs / Tbili / TG WNL, albumin 2.7 (3/30) ?Intake / Output; MIVF: UOP 1.7 ml/kg/hr; + flatus, last stool charted 3/30 - NGT removed 3/29. Net +11L (off LR since 4/2 AM) ? ?GI Imaging: ?3/23 Incarcerated left inguinal small-bowel hernia with high-grade small bowel obstruction. ?3/27 KUB - suggestive of postoperative ileus ?GI Surgeries / Procedures:  ?3/23 femoral hernia repair with SBR ? ?Central access: PICC placed 3/25  ?TPN start date: 01/19/22 ? ?Nutritional Goals: ?Goal concentrated TPN rate is 55 mL/hr (provides 92 g of protein and 1645 kcals per day) ? ?RD Assessment: ?Estimated Needs ?Total Energy Estimated Needs: 1600 - 1800 ?Total Protein Estimated Needs: 80 - 95 grams ?Total Fluid Estimated Needs: >/= 1.6 L ? ?Current Nutrition:  ?Vital AF 1.2 - attempting trickle at 20 mL/hr on 4/4 ?TPN at 70 >> 55 ml/hr (concentrated)- providing 100% of needs ? ?Plan:  ?Concentrated TPN to help with volume- new rate of 55 mL/hr at 1800 (at goal) ?Electrolytes in TPN: *Adjusted with  concentration of TPN to keep amounts stable in TPN - Na 115 mEq/L, K 65 mEq/L, Ca 4 mEq/L Mag 8 mEq/L, increase Phos 25 mmol/L (nearing max); Cl:Ac to 1:1 ?Add standard MVI and trace elements to TPN ?S/p thiamine '100mg'$  daily x 7 days (3/25>>3/31) for refeeding ?Monitor standard TPN labs Mon/Thurs and PRN ?Monitor Surgery team plans ?Follow-up for progress - follow-up tolerance of trickle feeds; consider cycling if BG will allow if unable to tolerate enterals ? ?KCl 40 mEq x1 and Magnesium 1g due to Lasix dose today ?Repeating labs in am ? ?Thank you for allowing pharmacy to be a part of this patient?s care. ? ?Sloan Leiter, PharmD, BCPS, BCCCP ?Clinical Pharmacist ?Please refer to [facility] for [place] numbers ?01/29/2022, 7:30 AM ? ? ? ?

## 2022-01-29 NOTE — Progress Notes (Signed)
12 Days Post-Op  ? ?Subjective/Chief Complaint: ?Patient on vent this morning.  Relaxed this morning.  I&O cath last night with 1000cc of urine.  Bladder scan shortly pending.  A fib again overnight and given amio. ? ?Objective: ?Vital signs in last 24 hours: ?Temp:  [98.3 ?F (36.8 ?C)-100.6 ?F (38.1 ?C)] 100.6 ?F (38.1 ?C) (04/04 0800) ?Pulse Rate:  [84-142] 99 (04/04 0815) ?Resp:  [14-26] 18 (04/04 0815) ?BP: (68-130)/(53-93) 84/70 (04/04 0815) ?SpO2:  [93 %-100 %] 99 % (04/04 0815) ?FiO2 (%):  [40 %-60 %] 40 % (04/04 0800) ?Weight:  [57.6 kg] 57.6 kg (04/04 0500) ?Last BM Date : 01/25/22 ? ?Intake/Output from previous day: ?04/03 0701 - 04/04 0700 ?In: 4248.1 [I.V.:2975.4; IV Piggyback:1272.7] ?Out: 2375 [Urine:2375] ?Intake/Output this shift: ?Total I/O ?In: 132.4 [I.V.:79.9; NG/GT:40; IV Piggyback:12.4] ?Out: -  ? ?PE: ?Gen: Frail appearing female on vent ?Heart: regular ?Lungs: on vent ?Abd: soft, less distention, LLQ incision with decreasing foul smelling brown drainage.   NGT cannister with no output currently. ? ?Lab Results:  ?Recent Labs  ?  01/28/22 ?0304 01/29/22 ?0405  ?WBC 42.9* 29.3*  ?HGB 8.2* 7.4*  ?HCT 24.8* 21.6*  ?PLT 159 140*  ? ?BMET ?Recent Labs  ?  01/28/22 ?2149 01/29/22 ?0405  ?NA 131* 133*  ?K 4.0 3.9  ?CL 102 104  ?CO2 22 23  ?GLUCOSE 211* 113*  ?BUN 18 18  ?CREATININE 0.43* 0.46  ?CALCIUM 7.8* 8.1*  ? ?PT/INR ?No results for input(s): LABPROT, INR in the last 72 hours. ?ABG ?Recent Labs  ?  01/26/22 ?1616  ?PHART 7.316*  ?HCO3 20.6  ? ? ?Studies/Results: ?CT CHEST ABDOMEN PELVIS W CONTRAST ? ?Result Date: 01/27/2022 ?CLINICAL DATA:  Sepsis. Aspiration pneumonia. Evaluate for small bowel anastomotic leak versus draining infected hematoma from left femoral hernia repair site. EXAM: CT CHEST, ABDOMEN, AND PELVIS WITH CONTRAST TECHNIQUE: Multidetector CT imaging of the chest, abdomen and pelvis was performed following the standard protocol during bolus administration of intravenous contrast.  RADIATION DOSE REDUCTION: This exam was performed according to the departmental dose-optimization program which includes automated exposure control, adjustment of the mA and/or kV according to patient size and/or use of iterative reconstruction technique. CONTRAST:  136m OMNIPAQUE IOHEXOL 300 MG/ML  SOLN COMPARISON:  01/17/2022. FINDINGS: CT CHEST FINDINGS Cardiovascular: Mild cardiac enlargement. No pericardial effusion. The main pulmonary artery appears patent. No signs of central obstructing embolus. Mediastinum/Nodes: ETT tip is above the carina. There is a nasogastric tube in place with tip in the proximal body of stomach. No enlarged axillary, supraclavicular, mediastinal, or hilar lymph nodes. Lungs/Pleura: Small right pleural effusion. Large area of dense airspace consolidation with surrounding ground-glass attenuation is noted involving the posterior right upper lobe, superior segment of right lower lobe and posterior mid and basilar right lower lobe. Ground-glass attenuation with mild patchy airspace densities noted within the right middle lobe. Dense airspace disease is also identified within the left lower lobe. Subsegmental atelectasis and ground-glass attenuation noted within the lingula. Musculoskeletal: No chest wall mass or suspicious bone lesions identified. CT ABDOMEN PELVIS FINDINGS Hepatobiliary: Multiple benign liver hemangiomas are again noted involving both lobes of liver. The largest hemangioma is in the periphery of the right lobe measuring 3.8 cm and appears unchanged. No new focal liver abnormality. The gallbladder appears normal for degree of distension. No signs of bile duct dilatation. Pancreas: Unremarkable. No pancreatic ductal dilatation or surrounding inflammatory changes. Spleen: Normal in size without focal abnormality. Adrenals/Urinary Tract: Normal adrenal glands. The  right kidney appears normal. Benign Bosniak class 1 cyst within the left kidney is unchanged. No follow-up  recommended. No nephrolithiasis or hydronephrosis identified bilaterally. Urinary bladder is decompressed around a Foley catheter balloon. Stomach/Bowel: Stomach appears nondistended. The appendix is visualized and appears normal. Complex postoperative anatomy of the small bowel loops identified status post left inguinal hernia repair with small bowel resection and reanastomosis. Evaluation for small bowel pathology, including anastomotic dehiscence, is limited due to lack of oral contrast material. There is a dilated loop of small bowel which leads up to the anastomotic suture chain measuring 4 cm in diameter, image 32/6 and image 44/6. This may represent postoperative ileus or small-bowel obstruction secondary to stenosis at the anastomosis. Vascular/Lymphatic: No significant vascular findings are present. The upper abdominal vascularity appears patent. There is an enlarged fibroid uterus no enlarged abdominal or pelvic lymph nodes. Reproductive: Which appears partially calcified as before. Other: Free fluid identified within the abdomen and pelvis, similar in volume to the previous exam. Poorly defined complex heterogeneous mass in the left inguinal canal is identified measuring 4.0 x 2.9 by 5.7 cm. This is contiguous with peripherally enhancing fluid collections which extend along the fascial planes of the left lower quadrant ventral abdominal wall, image 41/6. There is an open wound within the left lower quadrant ventral abdominal wall which communicates with fistulous communication with the fluid collection tracking between the fascial layers of the left lower quadrant abdominal wall and left inguinal canal, image 105/3. Musculoskeletal: No acute or significant osseous findings. IMPRESSION: 1. Status post left inguinal hernia repair with small bowel resection and reanastomosis. Evaluation for small bowel pathology, including anastomotic dehiscence, is limited due to lack of oral contrast material. 2. There is  a dilated loop of small bowel which leads up to the anastomotic suture chain. This may represent postoperative ileus or small-bowel obstruction secondary to stenosis at the anastomosis. More definitive characterization with repeat CT of the abdomen pelvis with IV and oral contrast material may be helpful to assess the integrity of the anastomosis and differentiate between postop ileus versus obstruction. 3. Complex mass within the left inguinal canal is identified. This may represent postoperative hematoma, abscess or recurrent small bowel herniation. This communicates with fluid collections tracking between the fascial layers of the left lower quadrant ventral abdominal wall. 4. Left lower quadrant cutaneous fistula communicates with fluid tracking within the fascial layers of the left lower quadrant abdominal wall concerning for abscess. 5. Severe multi lobar airspace consolidation with surrounding ground-glass attenuation is identified within both lungs compatible with multifocal pneumonia. 6. Small right pleural effusion. 7. Multiple benign liver hemangiomas. 8. Enlarged fibroid uterus. Electronically Signed   By: Kerby Moors M.D.   On: 01/27/2022 14:21  ? ?DG CHEST PORT 1 VIEW ? ?Result Date: 01/29/2022 ?CLINICAL DATA:  Endotracheal tube placement. EXAM: PORTABLE CHEST 1 VIEW COMPARISON:  Radiographs earlier the same date and 01/27/2022. CT 01/27/2022. FINDINGS: 0551 hours. Endotracheal tube has been pulled back with its tip 2 cm above the carina. Enteric tube appears unchanged, projecting below the diaphragm. Left subclavian central venous catheter and right arm PICC appear unchanged. The heart size and mediastinal contours are stable. There are unchanged right greater than left airspace opacities. There is a small right pleural effusion. No evidence pneumothorax or acute osseous abnormality. IMPRESSION: Interval repositioning of the endotracheal tube, tip 2 cm above the carina. No other significant changes.  Electronically Signed   By: Richardean Sale M.D.   On: 01/29/2022 07:58  ? ?  DG Chest Port 1 View ? ?Result Date: 01/29/2022 ?CLINICAL DATA:  Aspiration pneumonia. EXAM: PORTABLE CHEST 1 VIEW COMPARISON:  P

## 2022-01-29 NOTE — Progress Notes (Addendum)
Nutrition Follow-up ? ?DOCUMENTATION CODES:  ? ?Severe malnutrition in context of social or environmental circumstances, Underweight ? ?INTERVENTION:  ? ?Continue TPN to meet 100% of estimated needs. ? ?Begin trial of trickle feedings with Vital AF 1.2 at 20 ml/h via NG tube. As tolerance established, recommend advance by 10 ml every 4 hours to goal rate of 60 ml/h to provide 1728 kcal, 108 gm protein, 1168 ml free water daily. ? ?NUTRITION DIAGNOSIS:  ? ?Severe Malnutrition related to social / environmental circumstances as evidenced by severe muscle depletion, severe fat depletion, percent weight loss. ? ?Ongoing  ? ?GOAL:  ? ?Patient will meet greater than or equal to 90% of their needs ? ?Met with TPN. ? ?MONITOR:  ? ?Diet advancement, Labs, Weight trends ? ?REASON FOR ASSESSMENT:  ? ?Malnutrition Screening Tool ?  ? ?ASSESSMENT:  ? ?71 y.o. female presented to the ED with abdominal pain with vomiting and constipation for 7 days. PMH includes Parkinson disease and GERD. Pt admitted with incarcerated left inguinal hernia w/ obstructions. ? ?3/23 S/P resection of necrotic small bowel. ?3/25 TPN initiated d/t post-op ileus.  ?3/29 NG tube removed.  ?3/30 Started on clear liquids. ?3/31 Advanced to full liquids. ?4/1 Transferred to the ICU and intubated. NG tube was replaced. ? ?Discussed patient in ICU rounds and with RN today. ?Received consult for trickle tube feedings, not to exceed 20 ml/h. ?NG tube in place.  ? ?Patient remains intubated on ventilator support ?MV: 8.2 L/min ?Temp (24hrs), Avg:98.9 ?F (37.2 ?C), Min:98.3 ?F (36.8 ?C), Max:100.6 ?F (38.1 ?C) ? ? ?Patient continues to receive TPN. Currently infusing at 70 ml/h to provide 1645 kcal and 92 gm protein daily.  ? ?Labs reviewed. Na 133 ?CBG: 116-99 ? ?Medications reviewed and include vitamin C, Colace, Miralax, Senokot-S, fentanyl, mag sulfate. ? ?Admission weight 53.1 kg ?Current weight 57.6 kg ? ? ?Diet Order:   ?Diet Order   ? ?       ?  Diet NPO  time specified  Diet effective now       ?  ? ?  ?  ? ?  ? ? ?EDUCATION NEEDS:  ? ?No education needs have been identified at this time ? ?Skin:  Skin Assessment: Reviewed RN Assessment ?Skin Integrity Issues:: Stage I ?Stage I: coccyx ? ?Last BM:  3/31 type 1 ? ?Height:  ? ?Ht Readings from Last 1 Encounters:  ?01/28/22 $RemoveBe'5\' 7"'wbZyiVbiO$  (1.702 m)  ? ? ?Weight:  ? ?Wt Readings from Last 1 Encounters:  ?01/29/22 57.6 kg  ? ? ?Ideal Body Weight:  61.4 kg ? ?BMI:  Body mass index is 19.89 kg/m?. ? ?Estimated Nutritional Needs:  ? ?Kcal:  1600 - 1800 ? ?Protein:  80 - 95 grams ? ?Fluid:  >/= 1.6 L ? ? ? ?Lucas Mallow RD, LDN, CNSC ?Please refer to Amion for contact information.                                                       ? ?

## 2022-01-29 NOTE — Progress Notes (Signed)
Critical care attending attestation note: ?I agree with the Advanced Practitioner's note, impression, and recommendations as outlined. I have taken an independent interval history, reviewed the chart and examined the patient. The following reflects my medical decision making and independent critical care time ? ? ?Synopsis of assessment and plan: ?71 year old female with Parkinson disease and dementia here with incarcerated femoral hernia, complicated with necrotic small bowel status post laparotomy and resection of bowel, course was complicated with acute hypoxic respiratory failure in the setting of aspiration pneumonia ? ?Patient did not tolerate a spontaneous breathing trial due to apnea ?Came off of vasopressors ?Spiked Tmax of 100.6 ?  ?Physical exam: ?General: Critically ill-appearing female, orally intubated ?HEENT: [place]/AT, eyes anicteric.  ETT and OGT in place ?Neuro: Opens eyes with vocal stimuli, intermittently following few commands ?Chest: Coarse breath sounds, no wheezes or rhonchi ?Heart: Regular rate and rhythm, no murmurs or gallops ?Abdomen: There is some discharge around surgical wound ?Skin: No rash ? ?Labs and images were reviewed ? ?Assessment and plan: ?Septic shock in the setting of aspiration pneumonia with Klebsiella ?Probable abdominal wound infection ?Acute hypoxic respiratory failure ?Incarcerated left-sided femoral hernia with small bowel necrosis s/p laparotomy and resection ?Postop ileus ?Hyponatremia ?Lactic acidosis ?Stage I decubitus ulcer on coccyx, POA ? ?Patient is off vasopressors now ?She did not tolerate a spontaneous breathing trial due to apnea ?Respiratory culture is growing Klebsiella oxytoca ?White count is trending down, she spiked low-grade fever with 100.6 ?Patient is currently on IV Zosyn ?General surgery is following ?Continue aggressive electrolyte supplement ?Wound care is following ?Monitor electrolytes ? ? ?CRITICAL CARE ?Performed by: Jacky Kindle ? ? ?Total  independent critical care time: 36 minutes ? ?Critical care time was exclusive of separately billable procedures and treating other patients. ? ?Critical care was necessary to treat or prevent imminent or life-threatening deterioration. ? ? ?Critical care was time spent personally by me on the following activities: development of treatment plan with patient and/or surrogate as well as nursing, discussions with consultants, evaluation of patient's response to treatment, examination of patient, obtaining history from patient or surrogate, ordering and performing treatments and interventions, ordering and review of laboratory studies, ordering and review of radiographic studies, pulse oximetry, re-evaluation of patient's condition and participation in multidisciplinary rounds. ? ?Jacky Kindle MD ?[facility] ?See Amion for pager ?If no response to pager, please call [phone] until 7pm ?After 7pm, Please call E-link [phone] ? ?01/29/2022, 1:06 PM ? ? ?

## 2022-01-29 NOTE — Progress Notes (Signed)
RT note. ?Patient started on SBT 8/5 40%, no labored breathing noted. RT will continue to monitor. ?

## 2022-01-30 ENCOUNTER — Inpatient Hospital Stay (HOSPITAL_COMMUNITY): Payer: Medicare HMO

## 2022-01-30 DIAGNOSIS — K4130 Unilateral femoral hernia, with obstruction, without gangrene, not specified as recurrent: Secondary | ICD-10-CM | POA: Diagnosis not present

## 2022-01-30 LAB — BASIC METABOLIC PANEL
Anion gap: 7 (ref 5–15)
BUN: 20 mg/dL (ref 8–23)
CO2: 25 mmol/L (ref 22–32)
Calcium: 8.4 mg/dL — ABNORMAL LOW (ref 8.9–10.3)
Chloride: 104 mmol/L (ref 98–111)
Creatinine, Ser: 0.49 mg/dL (ref 0.44–1.00)
GFR, Estimated: >60 mL/min (ref >60)
Glucose, Bld: 108 mg/dL — ABNORMAL HIGH (ref 70–99)
Potassium: 3.9 mmol/L (ref 3.5–5.1)
Sodium: 136 mmol/L (ref 135–145)

## 2022-01-30 LAB — CBC
HCT: 24.6 % — ABNORMAL LOW (ref 36.0–46.0)
Hemoglobin: 8.3 g/dL — ABNORMAL LOW (ref 12.0–15.0)
MCH: 30.2 pg (ref 26.0–34.0)
MCHC: 33.7 g/dL (ref 30.0–36.0)
MCV: 89.5 fL (ref 80.0–100.0)
Platelets: 186 10*3/uL (ref 150–400)
RBC: 2.75 MIL/uL — ABNORMAL LOW (ref 3.87–5.11)
RDW: 14.1 % (ref 11.5–15.5)
WBC: 21.4 10*3/uL — ABNORMAL HIGH (ref 4.0–10.5)
nRBC: 0.1 % (ref 0.0–0.2)

## 2022-01-30 LAB — GLUCOSE, CAPILLARY
Glucose-Capillary: 117 mg/dL — ABNORMAL HIGH (ref 70–99)
Glucose-Capillary: 107 mg/dL — ABNORMAL HIGH (ref 70–99)
Glucose-Capillary: 112 mg/dL — ABNORMAL HIGH (ref 70–99)
Glucose-Capillary: 108 mg/dL — ABNORMAL HIGH (ref 70–99)
Glucose-Capillary: 95 mg/dL (ref 70–99)
Glucose-Capillary: 108 mg/dL — ABNORMAL HIGH (ref 70–99)

## 2022-01-30 LAB — PHOSPHORUS: Phosphorus: 3.1 mg/dL (ref 2.5–4.6)

## 2022-01-30 LAB — MAGNESIUM: Magnesium: 2 mg/dL (ref 1.7–2.4)

## 2022-01-30 MED ORDER — TRACE MINERALS CU-MN-SE-ZN 300-55-60-3000 MCG/ML IV SOLN
INTRAVENOUS | Status: AC
  Filled 2022-01-30: qty 616

## 2022-01-30 MED ORDER — SODIUM CHLORIDE 0.9 % IV SOLN
100.0000 mg | INTRAVENOUS | Status: DC
  Administered 2022-01-30 – 2022-02-01 (×2): 100 mg via INTRAVENOUS
  Filled 2022-01-30: qty 5
  Filled 2022-01-30: qty 10
  Filled 2022-01-30: qty 5

## 2022-01-30 MED ORDER — QUETIAPINE FUMARATE 25 MG PO TABS
25.0000 mg | ORAL_TABLET | Freq: Two times a day (BID) | ORAL | Status: DC
  Administered 2022-01-31 – 2022-02-05 (×11): 25 mg
  Filled 2022-01-30 (×12): qty 1

## 2022-01-30 MED ORDER — POTASSIUM CHLORIDE 20 MEQ PO PACK: 40.0000 meq | PACK | Freq: Once | ORAL | Status: DC

## 2022-01-30 MED ORDER — IOHEXOL 9 MG/ML PO SOLN
ORAL | Status: AC
  Administered 2022-01-30: 500 mL
  Filled 2022-01-30: qty 1000

## 2022-01-30 NOTE — Progress Notes (Signed)
? ?NAME:  [name] [name], MRN:  [id number], DOB:  [birth year], LOS: 35 ?ADMISSION DATE:  01/17/2022, CONSULTATION DATE:  01/26/22 ?REFERRING MD:  Vance Gather, MD CHIEF COMPLAINT:  Aspiration, AHRF  ? ?History of Present Illness:  ?71 year old female with Parkinson's disease and anxiety who presented with abdominal pain and admitted for incarcerated left femoral hernia s/p resection of necrotic small bowel 3/23. Hospital course complicated by postop ileus on TPN. She was started on diet earlier this week. Last night had an episode of emesis. PCCM called to bedside this am for worsening hypoxemia, diaphoresis and respiratory distress. CXR with right middle and lower lobe infiltrate concerning for aspiration. She is tachycardic to 150s, normotensive, SpO2 75% on NRB. ? ?Pertinent  Medical History  ?Parkinson's disease and anxiety ? ?Significant Hospital Events: ?Including procedures, antibiotic start and stop dates in addition to other pertinent events   ?3/23 incarcerated left femoral hernia s/p resection of necrotic small bowel  ?4/1 Transferred to medical ICU for aspiration requiring intubation ?4/2 CT abd completed. Surgery felt fluid finding c/w old hematoma and not anastomotic leak. Recommended on-going wound care, abx and NPO status. Pressors for septic shock. Abx widened for aspiration  ?4/3 weaning pressors and FIO2 ?4/4 off pressors. Got lasix. Surg starting trickle feeds. Attempting weaning but sedation a barrier so fent gtt stopped  ? ?Interim History / Subjective:  ? ?Patient spiked fever of 101.6F in past 24 hours ?She has increased drainage from surgical wounds concerning for possible bowel leak ?CT Abdomen with Oral contrast ordered ? ?Patient's daughter at bedside. ? ?Objective   ?Blood pressure 128/73, pulse (!) 116, temperature (!) 101.7 ?F (38.7 ?C), temperature source Oral, resp. rate (!) 23, height '5\' 7"'$  (1.702 m), weight 57.6 kg, SpO2 99 %. ?   ?Vent Mode: PSV;CPAP ?FiO2 (%):  [30 %-40 %] 30 % ?Set  Rate:  [18 bmp] 18 bmp ?Vt Set:  [490 mL] 490 mL ?PEEP:  [5 cmH20] 5 cmH20 ?Pressure Support:  [8 cmH20-10 cmH20] 10 cmH20 ?Plateau Pressure:  [17 cmH20-18 cmH20] 18 cmH20  ? ?Intake/Output Summary (Last 24 hours) at 01/30/2022 0846 ?Last data filed at 01/30/2022 0600 ?Gross per 24 hour  ?Intake 2180.48 ml  ?Output 865 ml  ?Net 1315.48 ml  ? ?Filed Weights  ? 01/26/22 0630 01/27/22 0500 01/29/22 0500  ?Weight: 48.9 kg 49.6 kg 57.6 kg  ? ?Physical Exam: ?General: cachectic, elderly woman, no acute distress, intubated ?HENT: [place]/AT, sclera anicteric, OG in place ?Lungs: course breath sounds on right. No wheezing or rhonchi. ?Cardiovascular: tachycardic, no mrumurs ?Abdomen: soft, mildly distended, absent bowel sounds, LLQ incision with increased drainage ?Extremities: warm, no edema ?Neuro: sedated ?GU: n/a ? ?Resolved Hospital Problem list   ?Shock ? ?Assessment & Plan:  ? ?Sepsis secondary to aspiration pneumonia (Klebsiella oxytoca) and abdominal wound leak vs localized wound infection  ?- Off pressors 4/3 ?- Continue zosyn for abdominal coverage, day 4 ?- Add empiric fungal coverage with micafungin ?- Checking CT abdomen with oral contrast today ? ?Acute hypoxemic respiratory failure secondary to aspiration pneumonia/pneumonitis ?- continue ventilator support until abdominal issues are sorted out ?- VAP bundle ?- Daily SBT  ?- abx as above ? ?Femoral hernia of left side with obstruction with post-op ileus ?- s/p resection 01/17/2022.  4/2 Surgery concerned for wound leak ?- Cont localized wound care ?- Zosyn for aspiration and possible abd ?- Follow up abdominal CT today ?  ?Failure to thrive in adult ?- Severely malnourished baseline clearly  predates her acute illness. BMI is < 15.  ?- Palliative involved. Full code confirmed by patient and family. Goal is to "get well" back to PLOF.  ?Plan ?Needs SNF when better ?  ?Anxiety ?- Has been on buspar '5mg'$  BID, valium '2mg'$  (though this was prn vertigo).  ?Plan ?- Seroquel  '25mg'$  BID ?- stopped remeron per family request  ? ?Parkinson disease ([place]) ?- continue Sinemet  ? ?Pressure injury of coccygeal region, stage 1 ?POA, ?Plan ?Pressure relief interventions ?Maximize nutrition  ? ?Urinary retention ?Plan ?Continue to monitor ? ?Best Practice (right click and "Reselect all SmartList Selections" daily)  ? ?Diet/type: TPN ?DVT prophylaxis: lovenox ?GI prophylaxis: N/A ?Lines: N/A ?Foley:  N/A->may need to be replaced  ?Code Status:  full code ?Last date of multidisciplinary goals of care discussion [4/5] Updated daughter at bedside ? ?My cct 40 min ? ?Freda Jackson, MD ?[facility] ?Office: [phone] ? ? ?See Amion for personal pager ?PCCM on call pager [phone] until 7pm. ?Please call Elink 7p-7a. [phone] ? ? ? ?

## 2022-01-30 NOTE — Progress Notes (Signed)
RT note. ?Patient flipped to SBT 10/5 30% sat 100%, no labored breathing noted. RT will continue to monitor.  ?

## 2022-01-30 NOTE — Progress Notes (Signed)
Daughter is at bedside, Pt is resting with eyes closed resp. even non-labored. No distress present.  Daughter States that as long as patient is currently resting, please allow her to continue to rest and that she can be checked and changed, should she need it, later on in the morning since it took her so long to finally fall asleep.  ?

## 2022-01-30 NOTE — Progress Notes (Addendum)
13 Days Post-Op  ? ?Subjective/Chief Complaint: ?Patient on vent this morning.  Writes she has some pain at her incision.  Has been febrile overnight up to 101.7, tachycardic between 120-140 in ST, not a fib.   ? ?Objective: ?Vital signs in last 24 hours: ?Temp:  [97.9 ?F (36.6 ?C)-101.7 ?F (38.7 ?C)] 101.7 ?F (38.7 ?C) (04/05 7616) ?Pulse Rate:  [103-141] 116 (04/05 0716) ?Resp:  [12-36] 23 (04/05 0716) ?BP: (86-154)/(64-102) 128/73 (04/05 0600) ?SpO2:  [87 %-100 %] 99 % (04/05 0716) ?FiO2 (%):  [30 %-40 %] 30 % (04/05 0716) ?Last BM Date : 01/25/22 ? ?Intake/Output from previous day: ?04/04 0701 - 04/05 0700 ?In: 2135.2 [I.V.:1609.7; NG/GT:285; IV Piggyback:240.6] ?Out: 515 [Urine:450; Emesis/NG output:65] ?Intake/Output this shift: ?No intake/output data recorded. ? ?PE: ?Gen: Frail appearing female on vent ?Heart: regular ?Lungs: on vent, lungs more clear than previously ?Abd: soft, but increasing abdominal distention, LLQ incision with what appears to look like thin bilious type output possibly.   NGT in place with TFs at 20cc/hr.  These were stopped at bedside. ? ?Lab Results:  ?Recent Labs  ?  01/28/22 ?0304 01/29/22 ?0405  ?WBC 42.9* 29.3*  ?HGB 8.2* 7.4*  ?HCT 24.8* 21.6*  ?PLT 159 140*  ? ?BMET ?Recent Labs  ?  01/28/22 ?2149 01/29/22 ?0405  ?NA 131* 133*  ?K 4.0 3.9  ?CL 102 104  ?CO2 22 23  ?GLUCOSE 211* 113*  ?BUN 18 18  ?CREATININE 0.43* 0.46  ?CALCIUM 7.8* 8.1*  ? ?PT/INR ?No results for input(s): LABPROT, INR in the last 72 hours. ?ABG ?No results for input(s): PHART, HCO3 in the last 72 hours. ? ?Invalid input(s): PCO2, PO2 ? ? ?Studies/Results: ?DG Chest Port 1 View ? ?Result Date: 01/30/2022 ?CLINICAL DATA:  Multifocal pneumonia, follow-up, ventilator dependent. EXAM: PORTABLE CHEST 1 VIEW COMPARISON:  Portable chest yesterday at 5:51 a.m. FINDINGS: 3:55 a.m., 01/30/2022. ETT tip is 1.7 cm from the carina and could be withdrawn 1-2 cm for better positioning. Right PICC tip is at the level of the  superior cavoatrial junction with left subclavian line interval removal. NGT enters the stomach with the proximal side hole at the hiatus with interval pullback, could be advanced further in 5-8 cm for better positioning. Patchy dense consolidation of the right upper and both lower lung fields is again noted and slightly denser and more confluent today than yesterday, but no new area of opacity seen. The left mid and upper lung fields remain clear. There is no pneumothorax. There are minimal pleural effusions. Stable mediastinum with normal cardiac size. IMPRESSION: 1. ETT could be withdrawn 1-2 cm for better positioning, NGT could be advanced 5-8 cm for optimal placement. 2. Interval removal left subclavian line. No pneumothorax. Right PICC unchanged. 3. Right-greater-than-left air pace disease, denser and more confluent today but no new infiltrate is seen. Minimal pleural effusions. Electronically Signed   By: Telford Nab M.D.   On: 01/30/2022 06:15  ? ?DG CHEST PORT 1 VIEW ? ?Result Date: 01/29/2022 ?CLINICAL DATA:  Endotracheal tube placement. EXAM: PORTABLE CHEST 1 VIEW COMPARISON:  Radiographs earlier the same date and 01/27/2022. CT 01/27/2022. FINDINGS: 0551 hours. Endotracheal tube has been pulled back with its tip 2 cm above the carina. Enteric tube appears unchanged, projecting below the diaphragm. Left subclavian central venous catheter and right arm PICC appear unchanged. The heart size and mediastinal contours are stable. There are unchanged right greater than left airspace opacities. There is a small right pleural effusion. No evidence  pneumothorax or acute osseous abnormality. IMPRESSION: Interval repositioning of the endotracheal tube, tip 2 cm above the carina. No other significant changes. Electronically Signed   By: Richardean Sale M.D.   On: 01/29/2022 07:58  ? ?DG Chest Port 1 View ? ?Result Date: 01/29/2022 ?CLINICAL DATA:  Aspiration pneumonia. EXAM: PORTABLE CHEST 1 VIEW COMPARISON:  Portable  chest and CTA chest both 01/27/2022. FINDINGS: 3:52 a.m., 01/29/2022.  There are multiple overlying monitor wires. ETT tip is now 2 cm down the right main bronchus and should be withdrawn 6-7 cm to a mid tracheal positioning. Enteric tube is directed to the left with tip in the body of stomach well below the hiatus. Left subclavian line tip is just below the cavoatrial junction level with right PICC tip in the distal SVC. Dense patchy airspace disease in the right upper and lower and left lower lung fields persists unchanged with background COPD changes. The left mid and upper lung remain clear. Small right pleural effusion is unchanged. No pneumothorax. IMPRESSION: 1. Right main bronchus intubation. Recommend withdrawing the tube 6-7 cm to a mid tracheal position. 2. No noteworthy interval change in the bilateral airspace disease and right pleural effusion. 3. Results phoned to the ICU and given to Dr. Emmit Alexanders at 5:18 a.m., 01/29/2022, with acknowledgement of results. Electronically Signed   By: Telford Nab M.D.   On: 01/29/2022 05:17   ? ?Anti-infectives: ?Anti-infectives (From admission, onward)  ? ? Start     Dose/Rate Route Frequency Ordered Stop  ? 01/27/22 1115  piperacillin-tazobactam (ZOSYN) IVPB 3.375 g       ? 3.375 g ?12.5 mL/hr over 240 Minutes Intravenous Every 8 hours 01/27/22 1015    ? 01/26/22 1130  cefTRIAXone (ROCEPHIN) 2 g in sodium chloride 0.9 % 100 mL IVPB  Status:  Discontinued       ? 2 g ?200 mL/hr over 30 Minutes Intravenous Every 24 hours 01/26/22 1035 01/27/22 1015  ? ?  ? ? ?Assessment/Plan: ?POD 13,S/p emergent left femoral hernia repair with Phasix mesh/ small bowel resection 01/17/22 - Dr. Kieth Brightly for incarcerated left femoral hernia  ?-stop TFs due to abdominal distention, despite still having good BS ?-incisional drainage noted to look more serous with bilious tinge to it.  No more foul odor. ?-get new CT of abp/pel today with oral contrast to assess for anastomotic  leak. ?-cont TNA  ?-cont zosyn, new fevers and new/worsening tachycardia ?-daughter at bedside this morning and plan and care discussed with her. ?-labs pending for today ? ?FEN: IVF/TPN/NGT ?ID: zosyn 4/1 --> ?VTE: SCDs, Lovenox ?Foley: insert 4/4 ?Dispo: ICU ? ?Urinary retention - maintain foley for now 850cc documented yesterday ?Acute hypoxic respiratory failure secondary to aspiration PNA - appreciate CCM assistance with this patient.  WBC down to 29K today ?Tachycardia/A fib RVR - likely secondary to all of above.  Now in new sinus tach overnight and not a fib.   ?Parkinson's disease - per medicine, psych ?FTT in adult ?Anxiety  ?Anemia - hgb 7.4 this am, no evidence of bleeding ?Severe protein calorie malnutrition - TNA, albumin <1.5 and BMI 17 ? ? LOS: 13 days  ? ? ?Henreitta Cea ?01/30/2022 ? ?

## 2022-01-30 NOTE — Progress Notes (Signed)
PHARMACY - TOTAL PARENTERAL NUTRITION CONSULT NOTE  ? ?Indication: Prolonged ileus ? ?Patient Measurements: ?Height: '5\' 7"'$  (170.2 cm) ?Weight: 57.6 kg (126 lb 15.8 oz) ?IBW/kg (Calculated) : 61.6 ?TPN AdjBW (KG): 43.4 ?Body mass index is 19.89 kg/m?. ?Usual Weight: ~115 lbs ? ?Assessment:  ?71 y.o. F s/p femoral hernia repair with SBR 2/2 gangrene on 3/23. Pt with NGT to suction. Noted poor po intake for about 6 months due to living situation. Daughter reports pt UBW ~115 lbs with wt loss of 20-30 lbs within past few months. No flatus or BM. Pharmacy consulted to start TPN for ileus in pt with severe PCM upon admission. ? ?4/01 - patient with worsening respiratory failure, moved to the ICU and required intubation ?4/4 - Unable to tolerate trickle feeds due to abdominal distension ? ?Glucose / Insulin: CBGs controlled on no meds ?Electrolytes: Na 133, K 3.9 (goal >4), corrected Ca ~10.4 (Ca x Phos 32), Mg 2 (goal >2), others wnl   ?Renal: SCr stable WNL, BUN 18 (trending down) ?Hepatic: LFTs / Tbili / TG WNL, albumin 2.7 (3/30) ?Intake / Output; MIVF: UOP 1.7 ml/kg/hr; + flatus, last stool charted 3/30 - NGT removed 3/29. Net +13L  ? ?GI Imaging: ?3/23 Incarcerated left inguinal small-bowel hernia with high-grade small bowel obstruction. ?3/27 KUB - suggestive of postoperative ileus ?GI Surgeries / Procedures:  ?3/23 femoral hernia repair with SBR ? ?Central access: PICC placed 3/25  ?TPN start date: 01/19/22 ? ?Nutritional Goals: ?Goal concentrated TPN rate is 55 mL/hr (provides 92 g of protein and 1645 kcals per day) ? ?RD Assessment: ?Estimated Needs ?Total Energy Estimated Needs: 1600 - 1800 ?Total Protein Estimated Needs: 80 - 95 grams ?Total Fluid Estimated Needs: >/= 1.6 L ? ?Current Nutrition:  ?NPO (Did not tolerate trickle feeds 4/4 due to abdominal distension) ?TPN at 70 >> 55 ml/hr (concentrated)- providing 100% of needs ? ?Plan:  ?Continue concentrated TPN to help with volume- new rate of 55 mL/hr at 1800  (at goal) ?Electrolytes in TPN: *Adjusted with concentration of TPN to keep amounts stable in TPN - reduce Na 110 mEq/L, increase K 70 mEq/L, reduce Ca 2 mEq/L, Mag 8 mEq/L, increase Phos 25 mmol/L (nearing max); Cl:Ac to 1:1 ?Add standard MVI and trace elements to TPN ?S/p thiamine '100mg'$  daily x 7 days (3/25>>3/31) for refeeding ?Monitor standard TPN labs Mon/Thurs and PRN ?Monitor Surgery team plans ?Follow-up for progress - follow-up tolerance of trickle feeds; consider cycling if BG will allow if unable to tolerate enterals ? ?KCl 40 per tube x1 - increased in TPN as well.  ? ?Thank you for allowing pharmacy to be a part of this patient?s care. ? ?Sloan Leiter, PharmD, BCPS, BCCCP ?Clinical Pharmacist ?Please refer to [facility] for [place] numbers ?01/30/2022, 7:42 AM ? ? ? ?

## 2022-01-30 NOTE — Progress Notes (Signed)
Physical Therapy Treatment ?Patient Details ?Name: [name] [name] ?MRN: [id number] ?DOB: [birth year] ?Today's Date: 01/30/2022 ? ? ?History of Present Illness 71 yo female with 1 day of worsening abdominal pain and vomiting, s/p femoral hernia repair with SBR 2/2 gangrene on 3/23. Pt with NGT to suction. Noted poor po intake for about 6 months due to living situation. Daughter reports pt  with wt loss of 20-30# within past few months. 4/1 Transferred to medical ICU for aspiration requiring intubation. Significant PMH: Parkinson disease, anxiety ? ?  ?PT Comments  ? ? Patient participating well with in bed therex this session eager to perform more than prescribed per PT, but limited to in bed per RN.  Patient on weaning mode and vitals stable.  Daughter in room initially and supportive.  Continue to recommend STSNF at d/c.  PT will continue to follow.    ?Recommendations for follow up therapy are one component of a multi-disciplinary discharge planning process, led by the attending physician.  Recommendations may be updated based on patient status, additional functional criteria and insurance authorization. ? ?Follow Up Recommendations ? Skilled nursing-short term rehab (<3 hours/day) ?  ?  ?Assistance Recommended at Discharge Frequent or constant Supervision/Assistance  ?Patient can return home with the following A lot of help with walking and/or transfers;A lot of help with bathing/dressing/bathroom ?  ?Equipment Recommendations ? Wheelchair cushion (measurements PT);Wheelchair (measurements PT)  ?  ?Recommendations for Other Services   ? ? ?  ?Precautions / Restrictions Precautions ?Precautions: Fall ?Precaution Comments: watch HR, vent ?Restrictions ?Weight Bearing Restrictions: No  ?  ? ?Mobility ? Bed Mobility ?  ?  ?  ?  ?Supine to sit: Mod assist, +2 for physical assistance, HOB elevated ?  ?  ?General bed mobility comments: pulling up to long sit with bed rails x 10 for strengthening; otherwise mobility deferred  per RN ?  ? ?Transfers ?  ?  ?  ?  ?  ?  ?  ?  ?  ?  ?  ? ?Ambulation/Gait ?  ?  ?  ?  ?  ?  ?  ?  ? ? ?Stairs ?  ?  ?  ?  ?  ? ? ?Wheelchair Mobility ?  ? ?Modified Rankin (Stroke Patients Only) ?  ? ? ?  ?Balance   ?  ?  ?  ?  ?  ?  ?  ?  ?  ?  ?  ?  ?  ?  ?  ?  ?  ?  ?  ? ?  ?Cognition Arousal/Alertness: Awake/alert ?  ?Overall Cognitive Status: Difficult to assess ?  ?  ?  ?  ?  ?  ?  ?  ?  ?  ?  ?  ?  ?  ?  ?  ?General Comments: writing to communicate and daughter trying to decipher.  Patient follows commands well ?  ?  ? ?  ?Exercises Other Exercises ?Other Exercises: UE bicep curls and shoulder press with water bottle for resistance x 10 each ?Other Exercises: AP's, heel slides, hip abduction, SAQ all x 10 ?Other Exercises: pulling up to long sit mod A of 2 with bed rails x 10 ? ?  ?General Comments General comments (skin integrity, edema, etc.): on ps/cpap 40%, peep 8; VSS ?  ?  ? ?Pertinent Vitals/Pain Pain Assessment ?Pain Assessment: Faces ?Pain Score: 0-No pain  ? ? ?Home Living   ?  ?  ?  ?  ?  ?  ?  ?  ?  ?   ?  ?  Prior Function    ?  ?  ?   ? ?PT Goals (current goals can now be found in the care plan section) Progress towards PT goals: Not progressing toward goals - comment (limited per RN and intubated) ? ?  ?Frequency ? ? ? Min 3X/week ? ? ? ?  ?PT Plan Current plan remains appropriate  ? ? ?Co-evaluation   ?  ?  ?  ?  ? ?  ?AM-PAC PT "6 Clicks" Mobility   ?Outcome Measure ? Help needed turning from your back to your side while in a flat bed without using bedrails?: Total ?Help needed moving from lying on your back to sitting on the side of a flat bed without using bedrails?: Total ?Help needed moving to and from a bed to a chair (including a wheelchair)?: Total ?Help needed standing up from a chair using your arms (e.g., wheelchair or bedside chair)?: Total ?Help needed to walk in hospital room?: Total ?  ?6 Click Score: 5 ? ?  ?End of Session   ?Activity Tolerance: Patient tolerated treatment  well ?Patient left: in bed;with call bell/phone within reach ?  ?PT Visit Diagnosis: Muscle weakness (generalized) (M62.81);Difficulty in walking, not elsewhere classified (R26.2) ?  ? ? ?Time: 7322-0254 ?PT Time Calculation (min) (ACUTE ONLY): 15 min ? ?Charges:  $Therapeutic Exercise: 8-22 mins          ?          ? ?Magda Kiel, PT ?Acute Rehabilitation Services ?Y[facility]:[phone] ?Office:[phone] ?01/30/2022 ? ? ? ?Reginia Naas ?01/30/2022, 4:37 PM ? ?

## 2022-01-30 NOTE — Progress Notes (Addendum)
RT note. ?Patient transported to CT and back on full support. No complications noted. RT will continue to monitor. Patient placed on full support PRVC at this time  ?

## 2022-01-31 ENCOUNTER — Inpatient Hospital Stay (HOSPITAL_COMMUNITY): Payer: Medicare HMO

## 2022-01-31 DIAGNOSIS — K4130 Unilateral femoral hernia, with obstruction, without gangrene, not specified as recurrent: Secondary | ICD-10-CM | POA: Diagnosis not present

## 2022-01-31 LAB — COMPREHENSIVE METABOLIC PANEL
ALT: 24 U/L (ref 0–44)
AST: 24 U/L (ref 15–41)
Albumin: <1.5 g/dL — ABNORMAL LOW (ref 3.5–5.0)
Alkaline Phosphatase: 95 U/L (ref 38–126)
Anion gap: 8 (ref 5–15)
BUN: 18 mg/dL (ref 8–23)
CO2: 25 mmol/L (ref 22–32)
Calcium: 8.3 mg/dL — ABNORMAL LOW (ref 8.9–10.3)
Chloride: 101 mmol/L (ref 98–111)
Creatinine, Ser: 0.43 mg/dL — ABNORMAL LOW (ref 0.44–1.00)
GFR, Estimated: >60 mL/min (ref >60)
Glucose, Bld: 108 mg/dL — ABNORMAL HIGH (ref 70–99)
Potassium: 4 mmol/L (ref 3.5–5.1)
Sodium: 134 mmol/L — ABNORMAL LOW (ref 135–145)
Total Bilirubin: 0.6 mg/dL (ref 0.3–1.2)
Total Protein: 5.1 g/dL — ABNORMAL LOW (ref 6.5–8.1)

## 2022-01-31 LAB — CBC
HCT: 23.3 % — ABNORMAL LOW (ref 36.0–46.0)
Hemoglobin: 7.9 g/dL — ABNORMAL LOW (ref 12.0–15.0)
MCH: 30 pg (ref 26.0–34.0)
MCHC: 33.9 g/dL (ref 30.0–36.0)
MCV: 88.6 fL (ref 80.0–100.0)
Platelets: 193 10*3/uL (ref 150–400)
RBC: 2.63 MIL/uL — ABNORMAL LOW (ref 3.87–5.11)
RDW: 13.9 % (ref 11.5–15.5)
WBC: 20.6 10*3/uL — ABNORMAL HIGH (ref 4.0–10.5)
nRBC: 0 % (ref 0.0–0.2)

## 2022-01-31 LAB — PHOSPHORUS: Phosphorus: 3.3 mg/dL (ref 2.5–4.6)

## 2022-01-31 LAB — GLUCOSE, CAPILLARY
Glucose-Capillary: 107 mg/dL — ABNORMAL HIGH (ref 70–99)
Glucose-Capillary: 111 mg/dL — ABNORMAL HIGH (ref 70–99)
Glucose-Capillary: 116 mg/dL — ABNORMAL HIGH (ref 70–99)
Glucose-Capillary: 120 mg/dL — ABNORMAL HIGH (ref 70–99)
Glucose-Capillary: 129 mg/dL — ABNORMAL HIGH (ref 70–99)

## 2022-01-31 LAB — MAGNESIUM: Magnesium: 1.8 mg/dL (ref 1.7–2.4)

## 2022-01-31 MED ORDER — POTASSIUM CHLORIDE 20 MEQ PO PACK: 20.0000 meq | PACK | Freq: Once | ORAL | Status: DC

## 2022-01-31 MED ORDER — MAGNESIUM SULFATE 2 GM/50ML IV SOLN
2.0000 g | Freq: Once | INTRAVENOUS | Status: AC
  Administered 2022-01-31: 2 g via INTRAVENOUS
  Filled 2022-01-31: qty 50

## 2022-01-31 MED ORDER — POTASSIUM CHLORIDE 10 MEQ/100ML IV SOLN: 10.0000 meq | INTRAVENOUS | Status: DC

## 2022-01-31 MED ORDER — TRACE MINERALS CU-MN-SE-ZN 300-55-60-3000 MCG/ML IV SOLN
INTRAVENOUS | Status: AC
  Filled 2022-01-31: qty 616

## 2022-01-31 MED ORDER — POTASSIUM CHLORIDE 10 MEQ/50ML IV SOLN
10.0000 meq | INTRAVENOUS | Status: AC
  Administered 2022-01-31 (×2): 10 meq via INTRAVENOUS
  Filled 2022-01-31 (×2): qty 50

## 2022-01-31 NOTE — Progress Notes (Addendum)
14 Days Post-Op  ? ?Subjective/Chief Complaint: ?Patient on vent this morning.  Writes she has some pain in her back.  No decision yet on surgery.  No family at bedside currently ? ?Objective: ?Vital signs in last 24 hours: ?Temp:  [97.9 ?F (36.6 ?C)-101 ?F (38.3 ?C)] 98.1 ?F (36.7 ?C) (04/06 0802) ?Pulse Rate:  [96-127] 111 (04/06 0600) ?Resp:  [10-33] 16 (04/06 0600) ?BP: (83-149)/(59-87) 108/66 (04/06 0600) ?SpO2:  [94 %-100 %] 100 % (04/06 0600) ?FiO2 (%):  [30 %-40 %] 40 % (04/06 0802) ?Last BM Date : 01/30/22 ? ?Intake/Output from previous day: ?04/05 0701 - 04/06 0700 ?In: 1802.8 [I.V.:1533.7; IV Piggyback:269.1] ?Out: 1120 [Urine:1100; Emesis/NG output:20] ?Intake/Output this shift: ?No intake/output data recorded. ? ?PE: ?Gen: Frail appearing female on vent ?Heart: regular ?Lungs: on vent ?Abd: more tense today, but increasing abdominal distention, LLQ incision with bilious output.   NGT in place with no output. ? ?Lab Results:  ?Recent Labs  ?  01/30/22 ?0828 01/31/22 ?0615  ?WBC 21.4* 20.6*  ?HGB 8.3* 7.9*  ?HCT 24.6* 23.3*  ?PLT 186 193  ? ?BMET ?Recent Labs  ?  01/30/22 ?0828 01/31/22 ?0615  ?NA 136 134*  ?K 3.9 4.0  ?CL 104 101  ?CO2 25 25  ?GLUCOSE 108* 108*  ?BUN 20 18  ?CREATININE 0.49 0.43*  ?CALCIUM 8.4* 8.3*  ? ?PT/INR ?No results for input(s): LABPROT, INR in the last 72 hours. ?ABG ?No results for input(s): PHART, HCO3 in the last 72 hours. ? ?Invalid input(s): PCO2, PO2 ? ? ?Studies/Results: ?CT ABDOMEN PELVIS WO CONTRAST ? ?Result Date: 01/30/2022 ?CLINICAL DATA:  Recent left femoral hernia repair with small bowel resection, evaluate for anastomotic leak EXAM: CT ABDOMEN AND PELVIS WITHOUT CONTRAST TECHNIQUE: Multidetector CT imaging of the abdomen and pelvis was performed following the standard protocol without IV contrast. Oral contrast was administered. RADIATION DOSE REDUCTION: This exam was performed according to the departmental dose-optimization program which includes automated exposure  control, adjustment of the mA and/or kV according to patient size and/or use of iterative reconstruction technique. COMPARISON:  01/27/2022 FINDINGS: Lower chest: Large infiltrates are seen in both lower lung fields, more so on the right side suggesting atelectasis/pneumonia. Heart is enlarged in size. There is small to moderate right pleural effusion with interval increase. Hepatobiliary: Liver is enlarged measuring 20.3 cm in length. There are few scattered low-density lesions of varying sizes in the liver which are not fully evaluated in this noncontrast study. There is no dilation of bile ducts. Gallbladder is difficult to visualize. Pancreas: No focal abnormality is seen. Spleen: Unremarkable. Adrenals/Urinary Tract: Adrenals are unremarkable. There is no hydronephrosis. There is possible cyst in the anterior midportion of left kidney. There are no demonstrable renal or ureteral stones. Urinary bladder is distended. Stomach/Bowel: Tip of enteric tube is seen at the level of lower thoracic esophagus close to the gastroesophageal junction. Stomach is not distended. There is mild dilation of few small bowel loops. Surgical staples are seen in the small bowel loops in the lower mid abdomen. There is 5.9 x 3.3 cm focal fluid collection adjacent to the surgical staples. There is contrast in the lumen of multiple small bowel loops. Contrast has not entered the colon. There is evidence of small amount of contrast in the region of an open wound in the skin in the left inguinal region. This finding was not seen in the previous study. Findings suggest possible extravasation of contrast from small bowel loop. Exact source of this leak  is not clearly identified. There are partly decompressed small-bowel loops immediately behind the left rectus muscle. There is no demonstrable extravasation of contrast into the peritoneal cavity. Appendix is not distinctly seen. There is no significant wall thickening in colon.  Vascular/Lymphatic: Unremarkable. Reproductive: Uterus is enlarged with calcified fibroids largest measuring a proximally 7.3 cm in diameter in the left side of the fundus. Other: There is no demonstrable pneumoperitoneum. There is diffuse edema in the subcutaneous plane suggesting anasarca. There is 3.7 x 3.5 cm complex structure in the region of left femoral canal possibly postoperative hematoma/seroma. Musculoskeletal: Degenerative changes are noted in the lumbar spine more severe at L5-S1 level. IMPRESSION: There is an open wound in the skin in the left inguinal region. There is interval appearance of high density material in this area, possibly extravasated oral contrast suggesting possible enterocutaneous fistula. Exact source of this extravasation is not clearly identified. There are few partly decompressed and partly dilated small bowel loops in the left lower quadrant of abdomen. Surgical staples are seen in the small bowel loops in the lower mid abdomen. There is 5.9 x 3.3 cm fluid density adjacent to the anastomotic site, possibly resolving hematoma or seroma. There are no pockets of air within this fluid collection. There is 3.7 x 3.5 cm complex structure in the region of left femoral canal possibly postoperative hematoma/seroma. There is no evidence of intestinal obstruction or pneumoperitoneum. There is no hydronephrosis. Large infiltrates are seen in both lower lung fields, more so on the right side suggesting multifocal pneumonia. Small to moderate right pleural effusion with interval increase in size. There is diffuse edema in subcutaneous plane suggesting anasarca. There are multiple low-density lesions of varying sizes in the liver which are not fully evaluated in this noncontrast study. Tip of enteric tube is noted in the area of lower thoracic esophagus close to the gastroesophageal junction. Enteric tube should be advanced 10 cm to place the tip within stomach. Enlarged uterus with fibroids.  Other findings as described in the body of the report. Electronically Signed   By: Elmer Picker M.D.   On: 01/30/2022 12:43  ? ?DG Chest Port 1 View ? ?Result Date: 01/30/2022 ?CLINICAL DATA:  Multifocal pneumonia, follow-up, ventilator dependent. EXAM: PORTABLE CHEST 1 VIEW COMPARISON:  Portable chest yesterday at 5:51 a.m. FINDINGS: 3:55 a.m., 01/30/2022. ETT tip is 1.7 cm from the carina and could be withdrawn 1-2 cm for better positioning. Right PICC tip is at the level of the superior cavoatrial junction with left subclavian line interval removal. NGT enters the stomach with the proximal side hole at the hiatus with interval pullback, could be advanced further in 5-8 cm for better positioning. Patchy dense consolidation of the right upper and both lower lung fields is again noted and slightly denser and more confluent today than yesterday, but no new area of opacity seen. The left mid and upper lung fields remain clear. There is no pneumothorax. There are minimal pleural effusions. Stable mediastinum with normal cardiac size. IMPRESSION: 1. ETT could be withdrawn 1-2 cm for better positioning, NGT could be advanced 5-8 cm for optimal placement. 2. Interval removal left subclavian line. No pneumothorax. Right PICC unchanged. 3. Right-greater-than-left air pace disease, denser and more confluent today but no new infiltrate is seen. Minimal pleural effusions. Electronically Signed   By: Telford Nab M.D.   On: 01/30/2022 06:15   ? ?Anti-infectives: ?Anti-infectives (From admission, onward)  ? ? Start     Dose/Rate Route Frequency Ordered Stop  ?  01/30/22 1000  micafungin (MYCAMINE) 100 mg in sodium chloride 0.9 % 100 mL IVPB       ? 100 mg ?110 mL/hr over 1 Hours Intravenous Every 24 hours 01/30/22 0849    ? 01/27/22 1115  piperacillin-tazobactam (ZOSYN) IVPB 3.375 g       ? 3.375 g ?12.5 mL/hr over 240 Minutes Intravenous Every 8 hours 01/27/22 1015    ? 01/26/22 1130  cefTRIAXone (ROCEPHIN) 2 g in sodium  chloride 0.9 % 100 mL IVPB  Status:  Discontinued       ? 2 g ?200 mL/hr over 30 Minutes Intravenous Every 24 hours 01/26/22 1035 01/27/22 1015  ? ?  ? ? ?Assessment/Plan: ?POD 14,S/p emergent left femoral hernia re

## 2022-01-31 NOTE — Progress Notes (Signed)
SLP Cancellation Note ? ?Patient Details ?Name: ALONNAH LAMPKINS ?MRN: [id number] ?DOB: [birth year] ? ? ?Cancelled treatment:       Reason Eval/Treat Not Completed: Medical issues which prohibited therapy (remains on vent). Will continue to follow as able.  ? ? ? ?Osie Bond., M.A. CCC-SLP ?Acute Rehabilitation Services ?Office [phone] ? ?Secure chat preferred ? ?01/31/2022, 7:20 AM ?

## 2022-01-31 NOTE — Consult Note (Addendum)
WOC Nurse Consult Note: ?Surgical team following for assessment and plan of care.  Requested to apply Eakin pouch to left groin wound to attempt to contain drainage from a fistula. No pseudostoma visible at this time. ?Left lower abd near groin fold with full thickness post-op wound; 4X2X1cm, red and moist with large amt green drainage. Applied small Eakin pouch over the location. Supplies ordered to the room for staff nurse use.  ?Dressing procedure/placement/frequency: Topical treatment orders provided for bedside nurses to perform as follows: Bedside nurse; please change the Eakin pouch Kellie Simmering # [phone]) to left groin PRN when leaking. If it has large amt drainage, then connect to medium wall suction. ?[place] team will check weekly to determine if a change is needed at that time to the plan of care. ?Julien Girt MSN, RN, [place], [place], CNS ?[phone]  ?  ?

## 2022-01-31 NOTE — Progress Notes (Signed)
I met with the patient's family at bedside today, including some of the patient's sisters as well as the oldest daughter [name] [name].  Dr. Brantley Stage has already had a long discussion with them yesterday regarding her situation as well as options of surgical vs non-surgical management.  We again went over these options today and allowed the family plenty of space and time to ask questions regarding management for each situation and possible outcomes.  The patient is overall "improving" today and certainly no worse.  The family has elected to continue with non-operative management at this time.  We did discuss that it is reasonable (and at family's request) to repeat a CT scan mid to later next week to make sure things appear stable.  For now she has an Eakin's pouch in place over her incision, on IV abx therapy, NPO, on TNA.  Family was very appreciative and receptive to our conversation.  We did discuss that if something acutely changes that plans can be altered at this time if needed.  They thanked me for my time and explanation.  Continue current management for now. ? ?Henreitta Cea ?3:24 PM  ?01/31/2022 ? ?

## 2022-01-31 NOTE — Progress Notes (Signed)
? ?NAME:  [name] [name], MRN:  [id number], DOB:  1951-10-17, LOS: 68 ?ADMISSION DATE:  01/17/2022, CONSULTATION DATE:  01/26/22 ?REFERRING MD:  Vance Gather, MD CHIEF COMPLAINT:  Aspiration, AHRF  ? ?History of Present Illness:  ?71 year old female with Parkinson's disease and anxiety who presented with abdominal pain and admitted for incarcerated left femoral hernia s/p resection of necrotic small bowel 3/23. Hospital course complicated by postop ileus on TPN. She was started on diet earlier this week. Last night had an episode of emesis. PCCM called to bedside this am for worsening hypoxemia, diaphoresis and respiratory distress. CXR with right middle and lower lobe infiltrate concerning for aspiration. She is tachycardic to 150s, normotensive, SpO2 75% on NRB. ? ?Pertinent  Medical History  ?Parkinson's disease and anxiety ? ?Significant Hospital Events: ?Including procedures, antibiotic start and stop dates in addition to other pertinent events   ?3/23 incarcerated left femoral hernia s/p resection of necrotic small bowel  ?4/1 Transferred to medical ICU for aspiration requiring intubation ?4/2 CT abd completed. Surgery felt fluid finding c/w old hematoma and not anastomotic leak. Recommended on-going wound care, abx and NPO status. Pressors for septic shock. Abx widened for aspiration  ?4/3 weaning pressors and FIO2 ?4/4 off pressors. Got lasix. Surg starting trickle feeds. Attempting weaning but sedation a barrier so fent gtt stopped  ?4/5 CT abdomen showing EC fistula, micafungin started ? ?Interim History / Subjective:  ? ?Patient spiked fever of 101F in past 24 hours ?CT abdomen with oral contrast shows EC fistula ? ?Patient updated with 24 hours events and scan results. We communicated as she wrote her thoughts down. She comprehends the current situation well.  ? ?Objective   ?Blood pressure 108/66, pulse (!) 111, temperature 98.1 ?F (36.7 ?C), temperature source Oral, resp. rate 16, height '5\' 7"'$  (1.702 m),  weight 57.6 kg, SpO2 100 %. ?   ?Vent Mode: PRVC ?FiO2 (%):  [30 %-40 %] 40 % ?Set Rate:  [18 bmp] 18 bmp ?Vt Set:  [490 mL] 490 mL ?PEEP:  [5 cmH20] 5 cmH20 ?Pressure Support:  [8 cmH20] 8 cmH20 ?Plateau Pressure:  [18 cmH20-20 cmH20] 18 cmH20  ? ?Intake/Output Summary (Last 24 hours) at 01/31/2022 0847 ?Last data filed at 01/31/2022 0600 ?Gross per 24 hour  ?Intake 1657.8 ml  ?Output 1115 ml  ?Net 542.8 ml  ? ?Filed Weights  ? 01/26/22 0630 01/27/22 0500 01/29/22 0500  ?Weight: 48.9 kg 49.6 kg 57.6 kg  ? ?Physical Exam: ?General: cachectic, elderly woman, no acute distress, intubated ?HENT: [place]/AT, sclera anicteric, OG in place ?Lungs: course breath sounds on right. No wheezing or rhonchi. ?Cardiovascular: tachycardic, no mrumurs ?Abdomen: soft, mildly distended, absent bowel sounds, LLQ incision with increased drainage ?Extremities: warm, no edema ?Neuro: sedated ?GU: n/a ? ?Resolved Hospital Problem list   ?Shock ? ?Assessment & Plan:  ? ?Sepsis secondary to aspiration pneumonia (Klebsiella oxytoca) and enterocutaneous fistula ?- Off pressors 4/3 ?- Continue zosyn for abdominal coverage, day 5 ?- Continue micafungin, day 2 ? ?Acute hypoxemic respiratory failure secondary to aspiration pneumonia/pneumonitis ?- continue ventilator support until abdominal issues are sorted out ?- If no plans for surgery in near future, will work on extubation ?- VAP bundle ?- Daily SBT  ?- abx as above ? ?Femoral hernia of left side with obstruction with post-op ileus ?- s/p resection 01/17/2022 ?- Cont localized wound care ?  ?Failure to thrive in adult ?- Severely malnourished baseline clearly predates her acute illness. BMI is < 15.  ?-  Palliative has been released by the family. ?Plan ?Needs SNF when better ?  ?Anxiety ?- Has been on buspar '5mg'$  BID, valium '2mg'$  (though this was prn vertigo).  ?Plan ?- Seroquel '25mg'$  BID ?- stopped remeron per family request  ? ?Parkinson disease ([place]) ?- continue Sinemet  ? ?Pressure injury of  coccygeal region, stage 1 ?POA, ?Plan ?Pressure relief interventions ?Maximize nutrition  ? ?Urinary retention ?Plan ?Continue to monitor ? ?Best Practice (right click and "Reselect all SmartList Selections" daily)  ? ?Diet/type: TPN ?DVT prophylaxis: lovenox ?GI prophylaxis: N/A ?Lines: N/A ?Foley:  N/A->may need to be replaced  ?Code Status:  full code ?Last date of multidisciplinary goals of care discussion [4/5] Updated daughter at bedside ? ?My cct 35 min ? ?Freda Jackson, MD ?[facility] ?Office: [phone] ? ? ?See Amion for personal pager ?PCCM on call pager [phone] until 7pm. ?Please call Elink 7p-7a. [phone] ? ? ? ?

## 2022-01-31 NOTE — TOC Progression Note (Signed)
Transition of Care (TOC) - Progression Note  ? ? ?Patient Details  ?Name: [name] [name] ?MRN: [id number] ?Date of Birth: 08/21/1951 ? ?Transition of Care (TOC) CM/SW Contact  ?Joanne Chars, LCSW ?Phone Number: ?01/31/2022, 8:23 AM ? ?Clinical Narrative:   Pt not medically ready for SNF.  TOC will continue to follow.   ? ? ? ?Expected Discharge Plan: [place] ?Barriers to Discharge: Continued Medical Work up, SNF Pending bed offer ? ?Expected Discharge Plan and Services ?Expected Discharge Plan: [place] ?In-house Referral: Clinical Social Work ?  ?Post Acute Care Choice: [place] ?Living arrangements for the past 2 months: [place] ?                ?  ?  ?  ?  ?  ?  ?  ?  ?  ?  ? ? ?Social Determinants of Health (SDOH) Interventions ?  ? ?Readmission Risk Interventions ?   ? View : No data to display.  ?  ?  ?  ? ? ?

## 2022-01-31 NOTE — Progress Notes (Signed)
Physical Therapy Treatment ?Patient Details ?Name: [name] [name] ?MRN: [id number] ?DOB: [birth year] ?Today's Date: 01/31/2022 ? ? ?History of Present Illness 71 yo female with 1 day of worsening abdominal pain and vomiting, s/p femoral hernia repair with SBR 2/2 gangrene on 3/23. Pt with NGT to suction. Noted poor po intake for about 6 months due to living situation. Daughter reports pt  with wt loss of 20-30# within past few months. 4/1 Transferred to medical ICU for aspiration requiring intubation. Significant PMH: Parkinson disease, anxiety ? ?  ?PT Comments  ? ? Pt able to tolerate sitting EOB x 8 minutes with assistive and with good participation. Continue to feel pt will need significant rehab at DC.    ?Recommendations for follow up therapy are one component of a multi-disciplinary discharge planning process, led by the attending physician.  Recommendations may be updated based on patient status, additional functional criteria and insurance authorization. ? ?Follow Up Recommendations ? Skilled nursing-short term rehab (<3 hours/day) ?  ?  ?Assistance Recommended at Discharge Frequent or constant Supervision/Assistance  ?Patient can return home with the following Two people to help with walking and/or transfers;A lot of help with bathing/dressing/bathroom ?  ?Equipment Recommendations ? Wheelchair cushion (measurements PT);Wheelchair (measurements PT)  ?  ?Recommendations for Other Services   ? ? ?  ?Precautions / Restrictions Precautions ?Precautions: Fall ?Precaution Comments: watch HR, vent ?Restrictions ?Weight Bearing Restrictions: No  ?  ? ?Mobility ? Bed Mobility ?Overal bed mobility: Needs Assistance ?Bed Mobility: Sit to Supine, Supine to Sit ?  ?  ?Supine to sit: Max assist, +2 for safety/equipment, HOB elevated ?Sit to supine: +2 for physical assistance, Max assist ?  ?General bed mobility comments: Pt assisting to bring legs off of bed. Assist to elevate trunk into sitting and bring hips to EOB.  Assist to lower trunk and bring legs back up into bed. ?  ? ?Transfers ?  ?  ?  ?  ?  ?  ?  ?  ?  ?  ?  ? ?Ambulation/Gait ?  ?  ?  ?  ?  ?  ?  ?  ? ? ?Stairs ?  ?  ?  ?  ?  ? ? ?Wheelchair Mobility ?  ? ?Modified Rankin (Stroke Patients Only) ?  ? ? ?  ?Balance Overall balance assessment: Needs assistance ?Sitting-balance support: Bilateral upper extremity supported, Feet supported ?Sitting balance-Leahy Scale: Poor ?Sitting balance - Comments: Pt sat EOB x 8 minutes with min to min guard assist ?  ?  ?  ?  ?  ?  ?  ?  ?  ?  ?  ?  ?  ?  ?  ?  ? ?  ?Cognition Arousal/Alertness: Awake/alert ?Behavior During Therapy: Flat affect ?Overall Cognitive Status: Difficult to assess ?  ?  ?  ?  ?  ?  ?  ?  ?  ?  ?  ?  ?  ?  ?  ?  ?General Comments: Pt following commands well ?  ?  ? ?  ?Exercises General Exercises - Lower Extremity ?Long Arc Quad: AROM, Seated, Both, 10 reps ? ?  ?General Comments   ?  ?  ? ?Pertinent Vitals/Pain Pain Assessment ?Pain Assessment: Faces ?Faces Pain Scale: Hurts a little bit ?Pain Location: abdomen ?Pain Descriptors / Indicators: Grimacing ?Pain Intervention(s): Repositioned, Limited activity within patient's tolerance  ? ? ?Home Living   ?  ?  ?  ?  ?  ?  ?  ?  ?  ?   ?  ?  Prior Function    ?  ?  ?   ? ?PT Goals (current goals can now be found in the care plan section) Acute Rehab PT Goals ?PT Goal Formulation: With patient ?Time For Goal Achievement: 02/14/22 ?Potential to Achieve Goals: Fair ?Progress towards PT goals: Goals downgraded-see care plan ? ?  ?Frequency ? ? ? Min 3X/week ? ? ? ?  ?PT Plan Current plan remains appropriate  ? ? ?Co-evaluation   ?  ?  ?  ?  ? ?  ?AM-PAC PT "6 Clicks" Mobility   ?Outcome Measure ? Help needed turning from your back to your side while in a flat bed without using bedrails?: A Lot ?Help needed moving from lying on your back to sitting on the side of a flat bed without using bedrails?: A Lot ?Help needed moving to and from a bed to a chair (including a  wheelchair)?: Total ?Help needed standing up from a chair using your arms (e.g., wheelchair or bedside chair)?: Total ?Help needed to walk in hospital room?: Total ?Help needed climbing 3-5 steps with a railing? : Total ?6 Click Score: 8 ? ?  ?End of Session   ?Activity Tolerance: Patient tolerated treatment well ?Patient left: in bed;with call bell/phone within reach;with family/visitor present ?Nurse Communication: Mobility status ?PT Visit Diagnosis: Muscle weakness (generalized) (M62.81);Difficulty in walking, not elsewhere classified (R26.2) ?  ? ? ?Time: 1039-1100 ?PT Time Calculation (min) (ACUTE ONLY): 21 min ? ?Charges:  $Therapeutic Activity: 8-22 mins          ?          ? ?[facility] PT ?Acute Rehabilitation Services ?Pager [phone] ?Office [phone] ? ? ? ?Shary Decamp [facility] ?01/31/2022, 11:34 AM ? ?

## 2022-01-31 NOTE — Progress Notes (Addendum)
PHARMACY - TOTAL PARENTERAL NUTRITION CONSULT NOTE  ? ?Indication: Prolonged ileus ? ?Patient Measurements: ?Height: '5\' 7"'$  (170.2 cm) ?Weight: 57.6 kg (126 lb 15.8 oz) ?IBW/kg (Calculated) : 61.6 ?TPN AdjBW (KG): 43.4 ?Body mass index is 19.89 kg/m?. ?Usual Weight: ~115 lbs ? ?Assessment:  ?71 y.o. F s/p femoral hernia repair with SBR 2/2 gangrene on 3/23. Pt with NGT to suction. Noted poor po intake for about 6 months due to living situation. Daughter reports pt UBW ~115 lbs with wt loss of 20-30 lbs within past few months. No flatus or BM. Pharmacy consulted to start TPN for ileus in pt with severe PCM upon admission. ? ?4/01 - patient with worsening respiratory failure, moved to the ICU and required intubation ?4/4 - Unable to tolerate trickle feeds due to abdominal distension ? ?Glucose / Insulin: CBGs controlled on no meds - low side.  ?Electrolytes: Na 134, K 4 (goal >4), corrected Ca ~10.3 (Ca x Phos 32), Mg 1.8 (goal >2), others wnl   ?Renal: SCr stable WNL, BUN 18 (trending down) ?Hepatic: LFTs / Tbili / TG WNL, albumin 2.7 (3/30) ?Intake / Output; MIVF: UOP 1.7 ml/kg/hr; + flatus, last stool charted 3/30 - NGT removed 3/29. Net +13L  ? ?GI Imaging: ?3/23 Incarcerated left inguinal small-bowel hernia with high-grade small bowel obstruction. ?3/27 KUB - suggestive of postoperative ileus ?GI Surgeries / Procedures:  ?3/23 femoral hernia repair with SBR ? ?Central access: PICC placed 3/25  ?TPN start date: 01/19/22 ? ?Nutritional Goals: ?Goal concentrated TPN rate is 55 mL/hr (provides 92 g of protein and 1657 kcals per day) ? ?RD Assessment: ?Estimated Needs ?Total Energy Estimated Needs: 1600 - 1800 ?Total Protein Estimated Needs: 80 - 95 grams ?Total Fluid Estimated Needs: >/= 1.6 L ? ?Current Nutrition:  ?NPO (Did not tolerate trickle feeds 4/4 due to abdominal distension) -- Now strict NPO and not receiving po medications ?TPN at 70 >> 55 ml/hr (concentrated)- providing 100% of needs ? ?Plan:  ?Continue  concentrated TPN at goal rate of 55 mL/hr  ?- adjusted down lipid to allow for more dextrose  ?Electrolytes in TPN: Na 112 mEq/L, K 70 mEq/L, reduce Ca 2 mEq/L, Mag 10 mEq/L, increase Phos 25 mmol/L (nearing max); Cl:Ac to 1:1 ?Add standard MVI and trace elements to TPN ?S/p thiamine '100mg'$  daily x 7 days (3/25>>3/31) for refeeding ?Monitor standard TPN labs Mon/Thurs and PRN ?Monitor Surgery team plans ?Follow-up for progress - follow-up tolerance of trickle feeds; consider cycling if BG will allow if unable to tolerate enterals ? ?Magnesium 2g IV x1. ?KCl 10 mEq IV x2.  ? ?Thank you for allowing pharmacy to be a part of this patient?s care. ? ?Sloan Leiter, PharmD, BCPS, BCCCP ?Clinical Pharmacist ?Please refer to [facility] for [place] numbers ?01/31/2022, 7:23 AM ? ? ? ?

## 2022-02-01 DIAGNOSIS — K4130 Unilateral femoral hernia, with obstruction, without gangrene, not specified as recurrent: Secondary | ICD-10-CM | POA: Diagnosis not present

## 2022-02-01 LAB — CBC
HCT: 22.6 % — ABNORMAL LOW (ref 36.0–46.0)
Hemoglobin: 7.5 g/dL — ABNORMAL LOW (ref 12.0–15.0)
MCH: 30 pg (ref 26.0–34.0)
MCHC: 33.2 g/dL (ref 30.0–36.0)
MCV: 90.4 fL (ref 80.0–100.0)
Platelets: 227 10*3/uL (ref 150–400)
RBC: 2.5 MIL/uL — ABNORMAL LOW (ref 3.87–5.11)
RDW: 14.1 % (ref 11.5–15.5)
WBC: 14 10*3/uL — ABNORMAL HIGH (ref 4.0–10.5)
nRBC: 0.3 % — ABNORMAL HIGH (ref 0.0–0.2)

## 2022-02-01 LAB — GLUCOSE, CAPILLARY
Glucose-Capillary: 101 mg/dL — ABNORMAL HIGH (ref 70–99)
Glucose-Capillary: 105 mg/dL — ABNORMAL HIGH (ref 70–99)
Glucose-Capillary: 109 mg/dL — ABNORMAL HIGH (ref 70–99)
Glucose-Capillary: 114 mg/dL — ABNORMAL HIGH (ref 70–99)
Glucose-Capillary: 118 mg/dL — ABNORMAL HIGH (ref 70–99)

## 2022-02-01 LAB — MAGNESIUM: Magnesium: 2.1 mg/dL (ref 1.7–2.4)

## 2022-02-01 LAB — BASIC METABOLIC PANEL
Anion gap: 5 (ref 5–15)
BUN: 20 mg/dL (ref 8–23)
CO2: 27 mmol/L (ref 22–32)
Calcium: 8.1 mg/dL — ABNORMAL LOW (ref 8.9–10.3)
Chloride: 103 mmol/L (ref 98–111)
Creatinine, Ser: 0.47 mg/dL (ref 0.44–1.00)
GFR, Estimated: >60 mL/min (ref >60)
Glucose, Bld: 93 mg/dL (ref 70–99)
Potassium: 4 mmol/L (ref 3.5–5.1)
Sodium: 135 mmol/L (ref 135–145)

## 2022-02-01 LAB — PHOSPHORUS: Phosphorus: 3.5 mg/dL (ref 2.5–4.6)

## 2022-02-01 MED ORDER — HYDROCORTISONE 1 % EX CREA
TOPICAL_CREAM | Freq: Every day | CUTANEOUS | Status: DC
Start: 2022-02-01 — End: 2022-02-14
  Filled 2022-02-01: qty 28

## 2022-02-01 MED ORDER — ORAL CARE MOUTH RINSE
15.0000 mL | Freq: Two times a day (BID) | OROMUCOSAL | Status: DC
  Administered 2022-02-01 – 2022-02-14 (×22): 15 mL via OROMUCOSAL

## 2022-02-01 MED ORDER — HYDROCODONE-ACETAMINOPHEN 5-325 MG PO TABS
1.0000 | ORAL_TABLET | Freq: Four times a day (QID) | ORAL | Status: DC | PRN
  Administered 2022-02-04 (×3): 2
  Filled 2022-02-01 (×3): qty 2

## 2022-02-01 MED ORDER — BISACODYL 10 MG RE SUPP
10.0000 mg | Freq: Every day | RECTAL | Status: DC | PRN
  Administered 2022-02-01: 10 mg via RECTAL
  Filled 2022-02-01: qty 1

## 2022-02-01 MED ORDER — HYDROCODONE-ACETAMINOPHEN 5-325 MG PO TABS
1.0000 | ORAL_TABLET | Freq: Four times a day (QID) | ORAL | Status: DC | PRN
  Administered 2022-02-01: 2 via ORAL
  Filled 2022-02-01: qty 2

## 2022-02-01 MED ORDER — TRACE MINERALS CU-MN-SE-ZN 300-55-60-3000 MCG/ML IV SOLN
INTRAVENOUS | Status: AC
  Filled 2022-02-01: qty 616

## 2022-02-01 NOTE — Progress Notes (Signed)
Patient ID: [name] [name], female   DOB: [birth year], 71 y.o.   MRN: [id number] ?[facility] Progress Note:   15 Days Post-Op  ?Subjective: ?Mental status is obtunded on ventilator.  Complaints none. ?Objective: ?Vital signs in last 24 hours: ?Temp:  [97.6 ?F (36.4 ?C)-99.1 ?F (37.3 ?C)] 98.1 ?F (36.7 ?C) (04/07 1116) ?Pulse Rate:  [80-119] 105 (04/07 0930) ?Resp:  [14-33] 17 (04/07 0930) ?BP: (84-138)/(57-78) 90/59 (04/07 1041) ?SpO2:  [97 %-100 %] 100 % (04/07 0930) ?FiO2 (%):  [40 %] 40 % (04/07 1041) ?Weight:  [52.2 kg] 52.2 kg (04/07 0500) ? ?Intake/Output from previous day: ?04/06 0701 - 04/07 0700 ?In: 1909.2 [I.V.:1627.6; NG/GT:100; IV Piggyback:181.6] ?Out: 950 [Urine:950] ?Intake/Output this shift: ?Total I/O ?In: 151.1 [I.V.:126.2; IV Piggyback:24.9] ?Out: -  ? ?Physical Exam: Work of breathing is on vent.  Egan's pouch on LLQ enteric fistula ? ?Lab Results:  ?Results for orders placed or performed during the hospital encounter of 01/17/22 (from the past 48 hour(s))  ?Glucose, capillary     Status: Abnormal  ? Collection Time: 01/30/22  3:11 PM  ?Result Value Ref Range  ? Glucose-Capillary 108 (H) 70 - 99 mg/dL  ?  Comment: Glucose reference range applies only to samples taken after fasting for at least 8 hours.  ?Glucose, capillary     Status: None  ? Collection Time: 01/30/22  7:50 PM  ?Result Value Ref Range  ? Glucose-Capillary 95 70 - 99 mg/dL  ?  Comment: Glucose reference range applies only to samples taken after fasting for at least 8 hours.  ?Glucose, capillary     Status: Abnormal  ? Collection Time: 01/30/22 11:23 PM  ?Result Value Ref Range  ? Glucose-Capillary 108 (H) 70 - 99 mg/dL  ?  Comment: Glucose reference range applies only to samples taken after fasting for at least 8 hours.  ?Glucose, capillary     Status: Abnormal  ? Collection Time: 01/31/22  3:11 AM  ?Result Value Ref Range  ? Glucose-Capillary 129 (H) 70 - 99 mg/dL  ?  Comment: Glucose reference range applies only to  samples taken after fasting for at least 8 hours.  ?Comprehensive metabolic panel     Status: Abnormal  ? Collection Time: 01/31/22  6:15 AM  ?Result Value Ref Range  ? Sodium 134 (L) 135 - 145 mmol/L  ? Potassium 4.0 3.5 - 5.1 mmol/L  ? Chloride 101 98 - 111 mmol/L  ? CO2 25 22 - 32 mmol/L  ? Glucose, Bld 108 (H) 70 - 99 mg/dL  ?  Comment: Glucose reference range applies only to samples taken after fasting for at least 8 hours.  ? BUN 18 8 - 23 mg/dL  ? Creatinine, Ser 0.43 (L) 0.44 - 1.00 mg/dL  ? Calcium 8.3 (L) 8.9 - 10.3 mg/dL  ? Total Protein 5.1 (L) 6.5 - 8.1 g/dL  ? Albumin <1.5 (L) 3.5 - 5.0 g/dL  ? AST 24 15 - 41 U/L  ? ALT 24 0 - 44 U/L  ? Alkaline Phosphatase 95 38 - 126 U/L  ? Total Bilirubin 0.6 0.3 - 1.2 mg/dL  ? GFR, Estimated >60 >60 mL/min  ?  Comment: (NOTE) ?Calculated using the CKD-EPI Creatinine Equation (2021) ?  ? Anion gap 8 5 - 15  ?  Comment: Performed at [facility], [place] [address] [place], [place] [address]  ?Magnesium     Status: None  ? Collection Time: 01/31/22  6:15 AM  ?Result Value  Ref Range  ? Magnesium 1.8 1.7 - 2.4 mg/dL  ?  Comment: Performed at [facility], [place] [address] [place], [place] [address]  ?Phosphorus     Status: None  ? Collection Time: 01/31/22  6:15 AM  ?Result Value Ref Range  ? Phosphorus 3.3 2.5 - 4.6 mg/dL  ?  Comment: Performed at [facility], [place] [address], [place], [place] [address]  ?CBC     Status: Abnormal  ? Collection Time: 01/31/22  6:15 AM  ?Result Value Ref Range  ? WBC 20.6 (H) 4.0 - 10.5 K/uL  ? RBC 2.63 (L) 3.87 - 5.11 MIL/uL  ? Hemoglobin 7.9 (L) 12.0 - 15.0 g/dL  ? HCT 23.3 (L) 36.0 - 46.0 %  ? MCV 88.6 80.0 - 100.0 fL  ? MCH 30.0 26.0 - 34.0 pg  ? MCHC 33.9 30.0 - 36.0 g/dL  ? RDW 13.9 11.5 - 15.5 %  ? Platelets 193 150 - 400 K/uL  ? nRBC 0.0 0.0 - 0.2 %  ?  Comment: Performed at [facility], [place] [address] [place],  [address]  ?Glucose, capillary     Status: Abnormal  ? Collection Time: 01/31/22  7:57 AM   ?Result Value Ref Range  ? Glucose-Capillary 120 (H) 70 - 99 mg/dL  ?  Comment: Glucose reference range applies only to samples taken after fasting for at least 8 hours.  ?Glucose, capillary     Status: Abnormal  ? Collection Time: 01/31/22  4:21 PM  ?Result Value Ref Range  ? Glucose-Capillary 116 (H) 70 - 99 mg/dL  ?  Comment: Glucose reference range applies only to samples taken after fasting for at least 8 hours.  ?Glucose, capillary     Status: Abnormal  ? Collection Time: 01/31/22  7:33 PM  ?Result Value Ref Range  ? Glucose-Capillary 111 (H) 70 - 99 mg/dL  ?  Comment: Glucose reference range applies only to samples taken after fasting for at least 8 hours.  ?Glucose, capillary     Status: Abnormal  ? Collection Time: 01/31/22 11:34 PM  ?Result Value Ref Range  ? Glucose-Capillary 107 (H) 70 - 99 mg/dL  ?  Comment: Glucose reference range applies only to samples taken after fasting for at least 8 hours.  ?Glucose, capillary     Status: Abnormal  ? Collection Time: 02/01/22  3:37 AM  ?Result Value Ref Range  ? Glucose-Capillary 109 (H) 70 - 99 mg/dL  ?  Comment: Glucose reference range applies only to samples taken after fasting for at least 8 hours.  ?Basic metabolic panel     Status: Abnormal  ? Collection Time: 02/01/22  4:17 AM  ?Result Value Ref Range  ? Sodium 135 135 - 145 mmol/L  ? Potassium 4.0 3.5 - 5.1 mmol/L  ? Chloride 103 98 - 111 mmol/L  ? CO2 27 22 - 32 mmol/L  ? Glucose, Bld 93 70 - 99 mg/dL  ?  Comment: Glucose reference range applies only to samples taken after fasting for at least 8 hours.  ? BUN 20 8 - 23 mg/dL  ? Creatinine, Ser 0.47 0.44 - 1.00 mg/dL  ? Calcium 8.1 (L) 8.9 - 10.3 mg/dL  ? GFR, Estimated >60 >60 mL/min  ?  Comment: (NOTE) ?Calculated using the CKD-EPI Creatinine Equation (2021) ?  ? Anion gap 5 5 - 15  ?  Comment: Performed at [facility], [place] [address], [place],  [address]  ?  Glucose, capillary     Status: Abnormal  ? Collection Time: 02/01/22  7:26 AM   ?Result Value Ref Range  ? Glucose-Capillary 118 (H) 70 - 99 mg/dL  ?  Comment: Glucose reference range applies only to samples taken after fasting for at least 8 hours.  ?CBC     Status: Abnormal  ? Collection Time: 02/01/22  7:50 AM  ?Result Value Ref Range  ? WBC 14.0 (H) 4.0 - 10.5 K/uL  ? RBC 2.50 (L) 3.87 - 5.11 MIL/uL  ? Hemoglobin 7.5 (L) 12.0 - 15.0 g/dL  ? HCT 22.6 (L) 36.0 - 46.0 %  ? MCV 90.4 80.0 - 100.0 fL  ? MCH 30.0 26.0 - 34.0 pg  ? MCHC 33.2 30.0 - 36.0 g/dL  ? RDW 14.1 11.5 - 15.5 %  ? Platelets 227 150 - 400 K/uL  ? nRBC 0.3 (H) 0.0 - 0.2 %  ?  Comment: Performed at [facility], [place] [address] [place], [place] [address]  ?Magnesium     Status: None  ? Collection Time: 02/01/22  7:50 AM  ?Result Value Ref Range  ? Magnesium 2.1 1.7 - 2.4 mg/dL  ?  Comment: Performed at [facility], [place] [address], [place], [place] [address]  ?Phosphorus     Status: None  ? Collection Time: 02/01/22  7:50 AM  ?Result Value Ref Range  ? Phosphorus 3.5 2.5 - 4.6 mg/dL  ?  Comment: Performed at [facility], [place] [address] [place], [place] [address]  ? ? ?Radiology/Results: ?CT ABDOMEN PELVIS WO CONTRAST ? ?Result Date: 01/30/2022 ?CLINICAL DATA:  Recent left femoral hernia repair with small bowel resection, evaluate for anastomotic leak EXAM: CT ABDOMEN AND PELVIS WITHOUT CONTRAST TECHNIQUE: Multidetector CT imaging of the abdomen and pelvis was performed following the standard protocol without IV contrast. Oral contrast was administered. RADIATION DOSE REDUCTION: This exam was performed according to the departmental dose-optimization program which includes automated exposure control, adjustment of the mA and/or kV according to patient size and/or use of iterative reconstruction technique. COMPARISON:  01/27/2022 FINDINGS: Lower chest: Large infiltrates are seen in both lower lung fields, more so on the right side suggesting atelectasis/pneumonia. Heart is enlarged in size. There is small to  moderate right pleural effusion with interval increase. Hepatobiliary: Liver is enlarged measuring 20.3 cm in length. There are few scattered low-density lesions of varying sizes in the liver which are not f

## 2022-02-01 NOTE — Progress Notes (Addendum)
Nutrition Follow-up ? ?DOCUMENTATION CODES:  ? ?Severe malnutrition in context of social or environmental circumstances, Underweight ? ?INTERVENTION:  ? ?Continue TPN to meet 100% of estimated needs. ? ?Hold off on trickle feedings d/t EC fistula. ? ?NUTRITION DIAGNOSIS:  ? ?Severe Malnutrition related to social / environmental circumstances as evidenced by severe muscle depletion, severe fat depletion, percent weight loss. ? ?Ongoing  ? ?GOAL:  ? ?Patient will meet greater than or equal to 90% of their needs ? ?Met with TPN. ? ?MONITOR:  ? ?Diet advancement, Labs, Weight trends ? ?REASON FOR ASSESSMENT:  ? ?Malnutrition Screening Tool ?  ? ?ASSESSMENT:  ? ?71 y.o. female presented to the ED with abdominal pain with vomiting and constipation for 7 days. PMH includes Parkinson disease and GERD. Pt admitted with incarcerated left inguinal hernia w/ obstructions. ? ?3/23 S/P resection of necrotic small bowel. ?3/25 TPN initiated d/t post-op ileus.  ?3/29 NG tube removed.  ?3/30 Started on clear liquids. ?3/31 Advanced to full liquids. ?4/1 Transferred to the ICU and intubated. NG tube was replaced. ?4/4 Trial of trickle feedings: Vital AF 1.2 at 20 ml/h.  ?4/5 Trickle feedings stopped d/t abdominal distention; CT scan showed EC fistula. ? ?Discussed patient in ICU rounds and with RN today. ?NG tube in place to [facility] with no output x 24 hours. ? ?Patient remains intubated on ventilator support ?MV: 7.5 L/min ?Temp (24hrs), Avg:98.4 ?F (36.9 ?C), Min:97.6 ?F (36.4 ?C), Max:99.1 ?F (37.3 ?C) ? ? ?TPN is currently infusing at 55 ml/h to provide 1675 kcal and 92 gm protein daily.  ? ?Labs reviewed.  ?CBG: [phone] ? ?Medications reviewed and include vitamin C, fentanyl. ? ?Admission weight 53.1 kg ?Current weight 52.2 kg ? ? ?Diet Order:   ?Diet Order   ? ?       ?  Diet NPO time specified  Diet effective now       ?  ? ?  ?  ? ?  ? ? ?EDUCATION NEEDS:  ? ?No education needs have been identified at this time ? ?Skin:  Skin  Assessment: Reviewed RN Assessment ?Skin Integrity Issues:: Stage II ?Stage I: - ?Stage II: sacrum ? ?Last BM:  4/6 type 6 ? ?Height:  ? ?Ht Readings from Last 1 Encounters:  ?01/28/22 5' 7" (1.702 m)  ? ? ?Weight:  ? ?Wt Readings from Last 1 Encounters:  ?02/01/22 52.2 kg  ? ? ?Ideal Body Weight:  61.4 kg ? ?BMI:  Body mass index is 18.02 kg/m?. ? ?Estimated Nutritional Needs:  ? ?Kcal:  1600 - 1800 ? ?Protein:  80 - 95 grams ? ?Fluid:  >/= 1.6 L ? ? ? ?Lucas Mallow RD, LDN, CNSC ?Please refer to [facility] for contact information.                                                       ? ?

## 2022-02-01 NOTE — Progress Notes (Signed)
SLP Cancellation Note ? ?Patient Details ?Name: [name] [name] ?MRN: [id number] ?DOB: [birth year] ? ? ?Cancelled treatment:       Reason Eval/Treat Not Completed: Medical issues which prohibited therapy. Pt remains intubated. Due to multiple cancels with pt still on vent, will sign off for now. Please reorder when able to resume SLP services.  ? ? ? ?Osie Bond., M.A. CCC-SLP ?Acute Rehabilitation Services ?Office [phone] ? ?Secure chat preferred ? ?02/01/2022, 7:23 AM ?

## 2022-02-01 NOTE — Procedures (Signed)
Extubation Procedure Note ? ?Patient Details:   ?Name: [name] [name] ?DOB: [birth year] ?MRN: [id number] ?  ?Airway Documentation:  ?  ?Vent end date: 02/01/22 Vent end time: 1536  ? ?Evaluation ? O2 sats: stable throughout ?Complications: No apparent complications ?Patient did tolerate procedure well. ?Bilateral Breath Sounds: Clear, Diminished ?  ?Yes ? ?Pt was extubated and placed on 3 L [place]. Cuff leak was present prior to extubation and no stridor post. Pt is stable at this time. Rt will monitor.  ? ?Ronaldo Miyamoto ?02/01/2022, 3:37 PM ? ?

## 2022-02-01 NOTE — Progress Notes (Signed)
PHARMACY - TOTAL PARENTERAL NUTRITION CONSULT NOTE ? ?Indication: Prolonged ileus with new EC fistula on 01/31/22 ? ?Patient Measurements: ?Height: '5\' 7"'$  (170.2 cm) ?Weight: 52.2 kg (115 lb 1.3 oz) ?IBW/kg (Calculated) : 61.6 ?TPN AdjBW (KG): 43.4 ?Body mass index is 18.02 kg/m?. ?Usual Weight: ~115 lbs ? ?Assessment:  ?37 YOF s/p femoral hernia repair with SBR 2/2 gangrene on 3/23.  Noted poor PO intake for about 6 months due to living situation. Daughter reports pt UBW ~115 lbs with wt loss of 20-30 lbs within past few months. Pharmacy consulted to manage TPN for ileus in pt with severe PCM upon admission. ? ?4/1 - patient with worsening respiratory failure, moved to the ICU and intubated ? ?Glucose / Insulin: CBGs controlled on no meds - low side.  ?Electrolytes: all WNL (Na low normal) ?Renal: SCr < 1, BUN WNL ?Hepatic: LFTs / tbili / TG WNL, albumin < 1.5 ?Intake / Output; MIVF: UOP 0.8 ml/kg/hr, + flatus, LBM 4/5. Net +13L  ? ?GI Imaging: ?3/23 Incarcerated L inguinal small-bowel hernia with high-grade SBO ?3/27 KUB - suggestive of postoperative ileus ?4/6 CT - possible EC fistula, hematoma vs seroma, anasarca, liver lesions ?GI Surgeries / Procedures:  ?3/23 femoral hernia repair with SBR ? ?Central access: PICC placed 01/19/22 ?TPN start date: 01/19/22 ? ?Nutritional Goals: RD Estimated Needs ?Total Energy Estimated Needs: 1600 - 1800 ?Total Protein Estimated Needs: 80 - 95 grams ?Total Fluid Estimated Needs: >/= 1.6 L ? ?Current Nutrition:  ?NPO (did not tolerate trickle feeds 4/4 d/t abd distension) ?TPN ? ?Plan:  ?Continue concentrated TPN at goal rate of 55 ml/hr, providing 92g AA, 244g CHO and 48g ILE for a total of 1676 kCal, meeting 100% of needs ?Electrolytes in TPN: increase Na 169mq/L, increase K 748m/L, Ca 29m55mL, Mag 43m5m, Phos 25mm31m, Cl:Ac 1:1 ?Add standard MVI and trace elements to TPN ?Standard TPN labs on Mon and Thurs ?Consider cycling if BG allows; f/u Surgery plans ? ?Cutberto Winfree D. Taronda Comacho,Mina MarblearmD, BCPS, BCCCP ?02/01/2022, 8:32 AM ? ?

## 2022-02-01 NOTE — Progress Notes (Signed)
? ?NAME:  [name] [name], MRN:  [id number], DOB:  October 02, 1951, LOS: 53 ?ADMISSION DATE:  01/17/2022, CONSULTATION DATE:  01/26/22 ?REFERRING MD:  Vance Gather, MD CHIEF COMPLAINT:  Aspiration, AHRF  ? ?History of Present Illness:  ?71 year old female with Parkinson's disease and anxiety who presented with abdominal pain and admitted for incarcerated left femoral hernia s/p resection of necrotic small bowel 3/23. Hospital course complicated by postop ileus on TPN. She was started on diet earlier this week. Last night had an episode of emesis. PCCM called to bedside this am for worsening hypoxemia, diaphoresis and respiratory distress. CXR with right middle and lower lobe infiltrate concerning for aspiration. She is tachycardic to 150s, normotensive, SpO2 75% on NRB. ? ?Pertinent  Medical History  ?Parkinson's disease and anxiety ? ?Significant Hospital Events: ?Including procedures, antibiotic start and stop dates in addition to other pertinent events   ?3/23 incarcerated left femoral hernia s/p resection of necrotic small bowel  ?4/1 Transferred to medical ICU for aspiration requiring intubation ?4/2 CT abd completed. Surgery felt fluid finding c/w old hematoma and not anastomotic leak. Recommended on-going wound care, abx and NPO status. Pressors for septic shock. Abx widened for aspiration  ?4/3 weaning pressors and FIO2 ?4/4 off pressors. Got lasix. Surg starting trickle feeds. Attempting weaning but sedation a barrier so fent gtt stopped  ?4/5 CT abdomen showing EC fistula, micafungin started ? ?Interim History / Subjective:  ? ?Still febrile. Appears comfortable on vent.  ? ?Objective   ?Blood pressure 112/67, pulse (!) 105, temperature 97.6 ?F (36.4 ?C), temperature source Oral, resp. rate 17, height '5\' 7"'$  (1.702 m), weight 52.2 kg, SpO2 100 %. ?   ?Vent Mode: PSV;CPAP ?FiO2 (%):  [40 %] 40 % ?Set Rate:  [18 bmp] 18 bmp ?Vt Set:  [490 mL] 490 mL ?PEEP:  [5 cmH20] 5 cmH20 ?Pressure Support:  [10 cmH20] 10  cmH20 ?Plateau Pressure:  [15 cmH20-17 cmH20] 15 cmH20  ? ?Intake/Output Summary (Last 24 hours) at 02/01/2022 0955 ?Last data filed at 02/01/2022 0900 ?Gross per 24 hour  ?Intake 2060.26 ml  ?Output 950 ml  ?Net 1110.26 ml  ? ?Filed Weights  ? 01/27/22 0500 01/29/22 0500 02/01/22 0500  ?Weight: 49.6 kg 57.6 kg 52.2 kg  ? ?Physical Exam: ?General: elderly female, thin, cachectic  ?HENT: NCAT, tracking  ?Lungs: BL vented breaths  ?Cardiovascular: RRR, s1 s2 ?Abdomen: mildly distended  ?Extremities: no edema  ?Neuro: sedated, comfortable on vent ? ?Resolved Hospital Problem list   ?Shock ? ?Assessment & Plan:  ? ?Sepsis secondary to aspiration pneumonia (Klebsiella oxytoca) and enterocutaneous fistula ?P: ?Continue zosyn  ?Stop micafungin ? ?Acute hypoxemic respiratory failure secondary to aspiration pneumonia/pneumonitis ?- continue abx  ?- Sat SBT ?- extubate today  ? ?Femoral hernia of left side with obstruction with post-op ileus ?- s/p resection 01/17/2022 ?- wound care  ?  ?Failure to thrive in adult ?- Severely malnourished baseline clearly predates her acute illness. BMI is < 15.  ?- Palliative has been released by the family. ?Plan ?Possibly needs SNF  ?  ?Anxiety ?- Has been on buspar '5mg'$  BID, valium '2mg'$  (though this was prn vertigo).  ?Plan ?Seroquel BID  ? ?Parkinson disease ([employer]) ?- continue sinemet  ? ?Pressure injury of coccygeal region, stage 1 ?POA, ?Plan ?Pressure relief  ? ?Urinary retention ?Plan ?Follow post extubation and sedation  ? ?Best Practice (right click and "Reselect all SmartList Selections" daily)  ? ?Diet/type: TPN ?DVT prophylaxis: lovenox ?GI prophylaxis: N/A ?  Lines: N/A ?Foley:  N/A->may need to be replaced  ?Code Status:  full code ?Last date of multidisciplinary goals of care discussion [4/5] Updated daughter at bedside ? ? ?This patient is critically ill with multiple organ system failure; which, requires frequent high complexity decision making, assessment, support, evaluation, and  titration of therapies. This was completed through the application of advanced monitoring technologies and extensive interpretation of multiple databases. During this encounter critical care time was devoted to patient care services described in this note for 34 minutes. ? ?Garner Nash, DO ?[facility] ?02/01/2022 9:55 AM   ? ? ?

## 2022-02-02 ENCOUNTER — Inpatient Hospital Stay (HOSPITAL_COMMUNITY): Payer: Medicare HMO

## 2022-02-02 LAB — GLUCOSE, CAPILLARY
Glucose-Capillary: 106 mg/dL — ABNORMAL HIGH (ref 70–99)
Glucose-Capillary: 90 mg/dL (ref 70–99)
Glucose-Capillary: 107 mg/dL — ABNORMAL HIGH (ref 70–99)
Glucose-Capillary: 114 mg/dL — ABNORMAL HIGH (ref 70–99)
Glucose-Capillary: 107 mg/dL — ABNORMAL HIGH (ref 70–99)
Glucose-Capillary: 100 mg/dL — ABNORMAL HIGH (ref 70–99)

## 2022-02-02 LAB — BASIC METABOLIC PANEL
Anion gap: 5 (ref 5–15)
BUN: 16 mg/dL (ref 8–23)
CO2: 25 mmol/L (ref 22–32)
Calcium: 8.4 mg/dL — ABNORMAL LOW (ref 8.9–10.3)
Chloride: 104 mmol/L (ref 98–111)
Creatinine, Ser: 0.43 mg/dL — ABNORMAL LOW (ref 0.44–1.00)
GFR, Estimated: >60 mL/min (ref >60)
Glucose, Bld: 122 mg/dL — ABNORMAL HIGH (ref 70–99)
Potassium: 3.9 mmol/L (ref 3.5–5.1)
Sodium: 134 mmol/L — ABNORMAL LOW (ref 135–145)

## 2022-02-02 LAB — CBC
HCT: 26.4 % — ABNORMAL LOW (ref 36.0–46.0)
Hemoglobin: 8.5 g/dL — ABNORMAL LOW (ref 12.0–15.0)
MCH: 29.2 pg (ref 26.0–34.0)
MCHC: 32.2 g/dL (ref 30.0–36.0)
MCV: 90.7 fL (ref 80.0–100.0)
Platelets: 305 10*3/uL (ref 150–400)
RBC: 2.91 MIL/uL — ABNORMAL LOW (ref 3.87–5.11)
RDW: 13.8 % (ref 11.5–15.5)
WBC: 14.2 10*3/uL — ABNORMAL HIGH (ref 4.0–10.5)
nRBC: 0.1 % (ref 0.0–0.2)

## 2022-02-02 MED ORDER — OXYCODONE HCL 5 MG PO TABS
5.0000 mg | ORAL_TABLET | Freq: Four times a day (QID) | ORAL | Status: DC | PRN
  Administered 2022-02-02 – 2022-02-04 (×3): 5 mg
  Filled 2022-02-02 (×3): qty 1

## 2022-02-02 MED ORDER — TRACE MINERALS CU-MN-SE-ZN 300-55-60-3000 MCG/ML IV SOLN
INTRAVENOUS | Status: AC
  Filled 2022-02-02: qty 616

## 2022-02-02 NOTE — Progress Notes (Signed)
?16 Days Post-Op  ? ?Chief Complaint/Subjective: ?Complained of right lower chest pain this morning, family concerned about more dyskinesis. ? ?Objective: ?Vital signs in last 24 hours: ?Temp:  [97.2 ?F (36.2 ?C)-98.5 ?F (36.9 ?C)] 98.5 ?F (36.9 ?C) (04/08 1136) ?Pulse Rate:  [91-112] 94 (04/08 1200) ?Resp:  [17-31] 27 (04/08 1200) ?BP: (92-125)/(56-78) 98/62 (04/08 1200) ?SpO2:  [97 %-100 %] 97 % (04/08 1200) ?Last BM Date : 02/02/22 ?Intake/Output from previous day: ?04/07 0701 - 04/08 0700 ?In: 1772.7 [I.V.:1474.9; IV Piggyback:297.8] ?Out: 4580 [Urine:1350; Emesis/NG output:50] ?Intake/Output this shift: ?Total I/O ?In: 285 [I.V.:285] ?Out: -  ? ?PE: ?Gen: NAD ?Resp: nonlabored ?Card: RRR ?Abd: soft, LLQ pouch with yellow thin fluid draining ? ?Lab Results:  ?Recent Labs  ?  02/01/22 ?0750 02/02/22 ?1019  ?WBC 14.0* 14.2*  ?HGB 7.5* 8.5*  ?HCT 22.6* 26.4*  ?PLT 227 305  ? ?BMET ?Recent Labs  ?  02/01/22 ?0417 02/02/22 ?1019  ?NA 135 134*  ?K 4.0 3.9  ?CL 103 104  ?CO2 27 25  ?GLUCOSE 93 122*  ?BUN 20 16  ?CREATININE 0.47 0.43*  ?CALCIUM 8.1* 8.4*  ? ?PT/INR ?No results for input(s): LABPROT, INR in the last 72 hours. ?CMP  ?   ?Component Value Date/Time  ? NA 134 (L) 02/02/2022 1019  ? K 3.9 02/02/2022 1019  ? CL 104 02/02/2022 1019  ? CO2 25 02/02/2022 1019  ? GLUCOSE 122 (H) 02/02/2022 1019  ? BUN 16 02/02/2022 1019  ? CREATININE 0.43 (L) 02/02/2022 1019  ? CREATININE 0.79 06/03/2018 1103  ? CALCIUM 8.4 (L) 02/02/2022 1019  ? PROT 5.1 (L) 01/31/2022 0615  ? ALBUMIN <1.5 (L) 01/31/2022 0615  ? AST 24 01/31/2022 0615  ? AST 17 06/03/2018 1103  ? ALT 24 01/31/2022 0615  ? ALT 11 06/03/2018 1103  ? ALKPHOS 95 01/31/2022 0615  ? BILITOT 0.6 01/31/2022 0615  ? BILITOT 0.5 06/03/2018 1103  ? GFRNONAA >60 02/02/2022 1019  ? GFRNONAA >60 06/03/2018 1103  ? GFRAA >60 01/31/2020 2048  ? GFRAA >60 06/03/2018 1103  ? ?Lipase  ?   ?Component Value Date/Time  ? LIPASE <10 (L) 01/17/2022 1416  ? ? ?Studies/Results: ?DG Abd 1  View ? ?Result Date: 01/31/2022 ?CLINICAL DATA:  Nasogastric tube placement. EXAM: ABDOMEN - 1 VIEW COMPARISON:  Prior today FINDINGS: Nasogastric tube has been advanced since previous study, with distal tip now overlying the gastric antrum. IMPRESSION: Nasogastric tube tip now overlies the gastric antrum. Electronically Signed   By: Marlaine Hind M.D.   On: 01/31/2022 19:03   ? ?Anti-infectives: ?Anti-infectives (From admission, onward)  ? ? Start     Dose/Rate Route Frequency Ordered Stop  ? 01/30/22 1000  micafungin (MYCAMINE) 100 mg in sodium chloride 0.9 % 100 mL IVPB  Status:  Discontinued       ? 100 mg ?110 mL/hr over 1 Hours Intravenous Every 24 hours 01/30/22 0849 02/01/22 1145  ? 01/27/22 1115  piperacillin-tazobactam (ZOSYN) IVPB 3.375 g       ? 3.375 g ?12.5 mL/hr over 240 Minutes Intravenous Every 8 hours 01/27/22 1015    ? 01/26/22 1130  cefTRIAXone (ROCEPHIN) 2 g in sodium chloride 0.9 % 100 mL IVPB  Status:  Discontinued       ? 2 g ?200 mL/hr over 30 Minutes Intravenous Every 24 hours 01/26/22 1035 01/27/22 1015  ? ?  ? ? ?Assessment/Plan ? s/p Procedure(s): ?Left femoral hernia repair with mesh and small bowel resection and  anastomosis 01/17/2022  ?-WBC stable, Eakin's pouch with thin yellow fluid ?-Chest XR today ? ?FEN - ice chips, TPN ?VTE - scds, lovenox ?ID - zosyn 4/1--> ?Disposition - ICU ? ? LOS: 16 days  ? ?I reviewed last 24 h vitals and pain scores, last 48 h intake and output, last 24 h labs and trends, and last 24 h imaging results. ? ?This care required moderate level of medical decision making.  ? ?Arta Bruce Sonnet Rizor ?[facility] ?02/02/2022, 2:43 PM ?Please see Amion for pager number during day hours 7:00am-4:30pm or 7:00am -11:30am on weekends ? ? ?

## 2022-02-02 NOTE — Progress Notes (Signed)
PHARMACY - TOTAL PARENTERAL NUTRITION CONSULT NOTE ? ?Indication: Prolonged ileus with new EC fistula on 01/30/22 ? ?Patient Measurements: ?Height: '5\' 7"'$  (170.2 cm) ?Weight: 52.2 kg (115 lb 1.3 oz) ?IBW/kg (Calculated) : 61.6 ?TPN AdjBW (KG): 43.4 ?Body mass index is 18.02 kg/m?. ?Usual Weight: ~115 lbs ? ?Assessment:  ?34 YOF s/p femoral hernia repair with SBR 2/2 gangrene on 3/23.  Noted poor PO intake for about 6 months due to living situation. Daughter reports pt UBW ~115 lbs with wt loss of 20-30 lbs within past few months. Pharmacy consulted to manage TPN for ileus in pt with severe PCM upon admission. ? ?Glucose / Insulin: CBGs controlled on no meds - low side.  ?Electrolytes: all WNL (Na low normal) on 4/7 ?Renal: SCr < 1, BUN WNL ?Hepatic: LFTs / tbili / TG WNL, albumin < 1.5 ?Intake / Output; MIVF: UOP 1.1 ml/kg/hr, NG 18m, LBM 4/7. Net +13L  ? ?GI Imaging: ?3/23 Incarcerated L inguinal small-bowel hernia with high-grade SBO ?3/27 KUB - suggestive of postoperative ileus ?4/5 CT - possible EC fistula, hematoma vs seroma, anasarca, liver lesions ?GI Surgeries / Procedures:  ?3/23 femoral hernia repair with SBR ? ?Central access: PICC placed 01/19/22 ?TPN start date: 01/19/22 ? ?Nutritional Goals: RD Estimated Needs ?Total Energy Estimated Needs: 1600 - 1800 ?Total Protein Estimated Needs: 80 - 95 grams ?Total Fluid Estimated Needs: >/= 1.6 L ? ?Current Nutrition:  ?NPO (did not tolerate trickle feeds 4/4 d/t abd distension) ?TPN ? ?Plan:  ?Continue concentrated TPN at goal rate of 55 ml/hr, providing 92g AA, 244g CHO and 48g ILE for a total of 1676 kCal, meeting 100% of needs ?Electrolytes in TPN: Na 1266m/L, K 7583mL, Ca 2mE58m, Mag 10mE41m Phos 25mmo81m Cl:Ac 1:1 ?Add standard MVI and trace elements to TPN ?Standard TPN labs on Mon and Thurs ?Consider cycling if BG allows and once out of the ICU  ? ?Ramey Schiff D. Gottfried Standish, Mina MarblemD, BCPS, BCCCP ?02/02/2022, 7:01 AM ? ?

## 2022-02-02 NOTE — Progress Notes (Signed)
PCCM: ? ?Patient doing well post extubation.  ?PCCM will sign off.  ?Please call with any questions.  ? ?Garner Nash, DO ? [facility] ?02/02/2022 7:30 AM   ? ? ?

## 2022-02-03 LAB — CBC
HCT: 26.2 % — ABNORMAL LOW (ref 36.0–46.0)
Hemoglobin: 8.5 g/dL — ABNORMAL LOW (ref 12.0–15.0)
MCH: 29.4 pg (ref 26.0–34.0)
MCHC: 32.4 g/dL (ref 30.0–36.0)
MCV: 90.7 fL (ref 80.0–100.0)
Platelets: 338 10*3/uL (ref 150–400)
RBC: 2.89 MIL/uL — ABNORMAL LOW (ref 3.87–5.11)
RDW: 13.8 % (ref 11.5–15.5)
WBC: 14.1 10*3/uL — ABNORMAL HIGH (ref 4.0–10.5)
nRBC: 0.1 % (ref 0.0–0.2)

## 2022-02-03 LAB — BASIC METABOLIC PANEL WITH GFR
Anion gap: 4 — ABNORMAL LOW (ref 5–15)
BUN: 15 mg/dL (ref 8–23)
CO2: 26 mmol/L (ref 22–32)
Calcium: 8.5 mg/dL — ABNORMAL LOW (ref 8.9–10.3)
Chloride: 107 mmol/L (ref 98–111)
Creatinine, Ser: 0.49 mg/dL (ref 0.44–1.00)
GFR, Estimated: >60 mL/min
Glucose, Bld: 113 mg/dL — ABNORMAL HIGH (ref 70–99)
Potassium: 3.9 mmol/L (ref 3.5–5.1)
Sodium: 137 mmol/L (ref 135–145)

## 2022-02-03 LAB — GLUCOSE, CAPILLARY
Glucose-Capillary: 117 mg/dL — ABNORMAL HIGH (ref 70–99)
Glucose-Capillary: 108 mg/dL — ABNORMAL HIGH (ref 70–99)
Glucose-Capillary: 104 mg/dL — ABNORMAL HIGH (ref 70–99)
Glucose-Capillary: 101 mg/dL — ABNORMAL HIGH (ref 70–99)

## 2022-02-03 MED ORDER — TRACE MINERALS CU-MN-SE-ZN 300-55-60-3000 MCG/ML IV SOLN
INTRAVENOUS | Status: AC
  Filled 2022-02-03: qty 612.27

## 2022-02-03 MED ORDER — DM-GUAIFENESIN ER 30-600 MG PO TB12: 1.0000 | ORAL_TABLET | Freq: Two times a day (BID) | ORAL | Status: DC

## 2022-02-03 MED ORDER — GUAIFENESIN-DM 100-10 MG/5ML PO SYRP
15.0000 mL | ORAL_SOLUTION | Freq: Two times a day (BID) | ORAL | Status: DC
  Administered 2022-02-03 – 2022-02-08 (×10): 15 mL
  Filled 2022-02-03 (×9): qty 20
  Filled 2022-02-03: qty 15

## 2022-02-03 NOTE — Progress Notes (Addendum)
?17 Days Post-Op  ? ?Chief Complaint/Subjective: ?Feeling bloated.  Think NG may have slid out a little but functioning well ? ?Objective: ?Vital signs in last 24 hours: ?Temp:  [97.7 ?F (36.5 ?C)-98.5 ?F (36.9 ?C)] 98 ?F (36.7 ?C) (04/09 3235) ?Pulse Rate:  [89-120] 109 (04/09 1000) ?Resp:  [17-30] 30 (04/09 1000) ?BP: (86-121)/(62-105) 97/83 (04/09 1000) ?SpO2:  [97 %-99 %] 99 % (04/09 1000) ?Last BM Date : 02/02/22 ?Intake/Output from previous day: ?04/08 0701 - 04/09 0700 ?In: 2222.3 [I.V.:2178.5; IV Piggyback:43.9] ?Out: 2025 [Urine:1900] ?Intake/Output this shift: ?No intake/output data recorded. ? ?PE: ?Gen: NAD ?Resp: nonlabored ?Card: RRR ?Abd: soft, LLQ pouch with yellow/green thin fluid draining ? ?Lab Results:  ?Recent Labs  ?  02/02/22 ?1019 02/03/22 ?0130  ?WBC 14.2* 14.1*  ?HGB 8.5* 8.5*  ?HCT 26.4* 26.2*  ?PLT 305 338  ? ? ?BMET ?Recent Labs  ?  02/02/22 ?1019 02/03/22 ?0130  ?NA 134* 137  ?K 3.9 3.9  ?CL 104 107  ?CO2 25 26  ?GLUCOSE 122* 113*  ?BUN 16 15  ?CREATININE 0.43* 0.49  ?CALCIUM 8.4* 8.5*  ? ? ?PT/INR ?No results for input(s): LABPROT, INR in the last 72 hours. ?CMP  ?   ?Component Value Date/Time  ? NA 137 02/03/2022 0130  ? K 3.9 02/03/2022 0130  ? CL 107 02/03/2022 0130  ? CO2 26 02/03/2022 0130  ? GLUCOSE 113 (H) 02/03/2022 0130  ? BUN 15 02/03/2022 0130  ? CREATININE 0.49 02/03/2022 0130  ? CREATININE 0.79 06/03/2018 1103  ? CALCIUM 8.5 (L) 02/03/2022 0130  ? PROT 5.1 (L) 01/31/2022 0615  ? ALBUMIN <1.5 (L) 01/31/2022 0615  ? AST 24 01/31/2022 0615  ? AST 17 06/03/2018 1103  ? ALT 24 01/31/2022 0615  ? ALT 11 06/03/2018 1103  ? ALKPHOS 95 01/31/2022 0615  ? BILITOT 0.6 01/31/2022 0615  ? BILITOT 0.5 06/03/2018 1103  ? GFRNONAA >60 02/03/2022 0130  ? GFRNONAA >60 06/03/2018 1103  ? GFRAA >60 01/31/2020 2048  ? GFRAA >60 06/03/2018 1103  ? ?Lipase  ?   ?Component Value Date/Time  ? LIPASE <10 (L) 01/17/2022 1416  ? ? ?Studies/Results: ?DG Chest Port 1 View ? ?Result Date:  02/02/2022 ?CLINICAL DATA:  Dyspnea EXAM: PORTABLE CHEST 1 VIEW COMPARISON:  Chest x-ray 01/30/2022, CT chest 01/27/2022. FINDINGS: Interval removal of an endotracheal tube. Right PICC with tip overlying the superior cavoatrial junction. Enteric tube coursing below the hemidiaphragm with tip and side port collimated off view. The heart and mediastinal contours are unchanged. Persistent right lung interstitial and airspace opacities as well as retrocardiac airspace opacity. No pulmonary edema. Trace fluid within the minor fissure. No pneumothorax. No acute osseous abnormality. IMPRESSION: 1. Persistent right lung interstiti Followup PA and lateral chest X-ray is recommended in 3-4 weeks following therapy to ensure resolution and exclude underlying malignancy. al and airspace opacities as well as retrocardiac airspace opacity. 2. Trace loculated pleural effusion within the minor fissure. Electronically Signed   By: Iven Finn M.D.   On: 02/02/2022 15:12   ? ?Anti-infectives: ?Anti-infectives (From admission, onward)  ? ? Start     Dose/Rate Route Frequency Ordered Stop  ? 01/30/22 1000  micafungin (MYCAMINE) 100 mg in sodium chloride 0.9 % 100 mL IVPB  Status:  Discontinued       ? 100 mg ?110 mL/hr over 1 Hours Intravenous Every 24 hours 01/30/22 0849 02/01/22 1145  ? 01/27/22 1115  piperacillin-tazobactam (ZOSYN) IVPB 3.375 g       ?  3.375 g ?12.5 mL/hr over 240 Minutes Intravenous Every 8 hours 01/27/22 1015    ? 01/26/22 1130  cefTRIAXone (ROCEPHIN) 2 g in sodium chloride 0.9 % 100 mL IVPB  Status:  Discontinued       ? 2 g ?200 mL/hr over 30 Minutes Intravenous Every 24 hours 01/26/22 1035 01/27/22 1015  ? ?  ? ? ?Assessment/Plan ? s/p Procedure(s): ?Left femoral hernia repair with mesh and small bowel resection and anastomosis 01/17/2022  ?-WBC stable at 14, Eakin's pouch with thin yellow fluid ?-Chest XR 4/8 ?1. Persistent right lung interstiti Followup PA and lateral chest ?X-ray is recommended in 3-4 weeks  following therapy to ensure ?resolution and exclude underlying malignancy. al and airspace ?opacities as well as retrocardiac airspace opacity. ?2. Trace loculated pleural effusion within the minor fissure. ? ?FEN - ice chips, TPN, NG LIWS ?VTE - scds, lovenox ?ID - zosyn 4/1--> ?Disposition - Okay for 6N today ? ? LOS: 17 days  ? ?I reviewed last 24 h vitals and pain scores, last 48 h intake and output, last 24 h labs and trends, and last 24 h imaging results. ? ?Daily Billing: ?[phone] - post op ? ?[name] [name] ?[facility] ?02/03/2022, 10:42 AM ?Please see Amion for pager number during day hours 7:00am-4:30pm or 7:00am -11:30am on weekends ? ? ?

## 2022-02-03 NOTE — Progress Notes (Signed)
PHARMACY - TOTAL PARENTERAL NUTRITION CONSULT NOTE ? ?Indication: Prolonged ileus with new EC fistula on 01/30/22 ? ?Patient Measurements: ?Height: '5\' 7"'$  (170.2 cm) ?Weight: 52.2 kg (115 lb 1.3 oz) ?IBW/kg (Calculated) : 61.6 ?TPN AdjBW (KG): 43.4 ?Body mass index is 18.02 kg/m?. ?Usual Weight: ~115 lbs ? ?Assessment:  ?42 YOF s/p femoral hernia repair with SBR 2/2 gangrene on 3/23.  Noted poor PO intake for about 6 months due to living situation. Daughter reports pt UBW ~115 lbs with wt loss of 20-30 lbs within past few months. Pharmacy consulted to manage TPN for ileus in pt with severe PCM upon admission. ? ?TPN order not charted 4/8 PM but is infusing per RN. ? ?Glucose / Insulin: CBGs low normal, not on SSI ?Electrolytes: K slightly low at 3.9 (goal >/= 4 for ileus), CoCa high normal at 10.5, others WNL ?Renal: SCr < 1, BUN WNL ?Hepatic: LFTs / tbili / TG WNL, albumin < 1.5 ?Intake / Output; MIVF: UOP 1 ml/kg/hr, NG 41m, LBM 4/8. Net +14L  ? ?GI Imaging: ?3/23 Incarcerated L inguinal small-bowel hernia with high-grade SBO ?3/27 KUB - suggestive of postoperative ileus ?4/5 CT - possible EC fistula, hematoma vs seroma, anasarca, liver lesions ?GI Surgeries / Procedures:  ?3/23 femoral hernia repair with SBR ? ?Central access: PICC placed 01/19/22 ?TPN start date: 01/19/22 ? ?Nutritional Goals: RD Estimated Needs ?Total Energy Estimated Needs: 1600 - 1800 ?Total Protein Estimated Needs: 80 - 95 grams ?Total Fluid Estimated Needs: >/= 1.6 L ? ?Current Nutrition:  ?NPO (did not tolerate trickle feeds 4/4 d/t abd distension) ?TPN ? ?Plan:  ?Trial cyclic TPN over 20 hrs (36-69 ml/hr, GIR < 5 mcg/kg/min).  TPN providing 92g AA and 1665 kCal, meeting 100% of needs ?Electrolytes in TPN: Na 1288m/L, increase K 8080mL, Ca 2mE73m for now, Mag 10mE89m Phos 25mmo73m Cl:Ac 1:1 ?Add standard MVI and trace elements to TPN ?Standard TPN labs on Mon and Thurs ?Monitor CBGs and reduce TPN cycle as able (CBGs at  0800/1500/1700) ? ?[name] [name] D. Theo Reither, [name] [name], BCPS, BCCCP ?02/03/2022, 7:16 AM ? ? ?

## 2022-02-04 ENCOUNTER — Inpatient Hospital Stay (HOSPITAL_COMMUNITY): Payer: Medicare HMO

## 2022-02-04 LAB — COMPREHENSIVE METABOLIC PANEL
ALT: 11 U/L (ref 0–44)
AST: 32 U/L (ref 15–41)
Albumin: 1.7 g/dL — ABNORMAL LOW (ref 3.5–5.0)
Alkaline Phosphatase: 65 U/L (ref 38–126)
Anion gap: 5 (ref 5–15)
BUN: 15 mg/dL (ref 8–23)
CO2: 25 mmol/L (ref 22–32)
Calcium: 8.3 mg/dL — ABNORMAL LOW (ref 8.9–10.3)
Chloride: 105 mmol/L (ref 98–111)
Creatinine, Ser: 0.44 mg/dL (ref 0.44–1.00)
GFR, Estimated: >60 mL/min (ref >60)
Glucose, Bld: 112 mg/dL — ABNORMAL HIGH (ref 70–99)
Potassium: 4.2 mmol/L (ref 3.5–5.1)
Sodium: 135 mmol/L (ref 135–145)
Total Bilirubin: 0.6 mg/dL (ref 0.3–1.2)
Total Protein: 5.6 g/dL — ABNORMAL LOW (ref 6.5–8.1)

## 2022-02-04 LAB — CBC
HCT: 26.3 % — ABNORMAL LOW (ref 36.0–46.0)
Hemoglobin: 8.4 g/dL — ABNORMAL LOW (ref 12.0–15.0)
MCH: 29 pg (ref 26.0–34.0)
MCHC: 31.9 g/dL (ref 30.0–36.0)
MCV: 90.7 fL (ref 80.0–100.0)
Platelets: 375 10*3/uL (ref 150–400)
RBC: 2.9 MIL/uL — ABNORMAL LOW (ref 3.87–5.11)
RDW: 13.8 % (ref 11.5–15.5)
WBC: 15.8 10*3/uL — ABNORMAL HIGH (ref 4.0–10.5)
nRBC: 0 % (ref 0.0–0.2)

## 2022-02-04 LAB — TRIGLYCERIDES: Triglycerides: 58 mg/dL (ref <150)

## 2022-02-04 LAB — PHOSPHORUS: Phosphorus: 3.3 mg/dL (ref 2.5–4.6)

## 2022-02-04 LAB — GLUCOSE, CAPILLARY
Glucose-Capillary: 98 mg/dL (ref 70–99)
Glucose-Capillary: 77 mg/dL (ref 70–99)
Glucose-Capillary: 105 mg/dL — ABNORMAL HIGH (ref 70–99)

## 2022-02-04 LAB — MAGNESIUM: Magnesium: 2 mg/dL (ref 1.7–2.4)

## 2022-02-04 MED ORDER — OXYCODONE HCL 5 MG PO TABS
5.0000 mg | ORAL_TABLET | ORAL | Status: DC | PRN
  Administered 2022-02-05 – 2022-02-07 (×9): 5 mg
  Filled 2022-02-04 (×11): qty 1

## 2022-02-04 MED ORDER — LIDOCAINE 5 % EX PTCH
1.0000 | MEDICATED_PATCH | CUTANEOUS | Status: DC
  Administered 2022-02-04 – 2022-02-14 (×11): 1 via TRANSDERMAL
  Filled 2022-02-04 (×11): qty 1

## 2022-02-04 MED ORDER — ACETAMINOPHEN 160 MG/5ML PO SOLN
650.0000 mg | Freq: Four times a day (QID) | ORAL | Status: DC
  Administered 2022-02-04 – 2022-02-08 (×14): 650 mg
  Filled 2022-02-04 (×15): qty 20.3

## 2022-02-04 MED ORDER — DOCUSATE SODIUM 50 MG/5ML PO LIQD: 100.0000 mg | Freq: Two times a day (BID) | ORAL | Status: DC | PRN

## 2022-02-04 MED ORDER — OXYCODONE HCL 5 MG PO TABS
5.0000 mg | ORAL_TABLET | ORAL | Status: DC | PRN
Start: 2022-02-04 — End: 2022-02-04

## 2022-02-04 MED ORDER — HYDROMORPHONE HCL 1 MG/ML IJ SOLN
0.5000 mg | INTRAMUSCULAR | Status: DC | PRN
Start: 2022-02-04 — End: 2022-02-05
  Administered 2022-02-04 – 2022-02-05 (×3): 0.5 mg via INTRAVENOUS
  Filled 2022-02-04 (×3): qty 0.5

## 2022-02-04 MED ORDER — TRACE MINERALS CU-MN-SE-ZN 300-55-60-3000 MCG/ML IV SOLN
INTRAVENOUS | Status: AC
  Filled 2022-02-04: qty 612.27

## 2022-02-04 NOTE — Progress Notes (Signed)
?   02/04/22 1630  ?Clinical Encounter Type  ?Visited With Patient and family together  ?Visit Type Initial;Other (Comment);Spiritual support ?(Advanced Directive)  ?Referral From Chaplain  ?Consult/Referral To Chaplain  ? ?Chaplain responded to a request from Harrie Foreman to visit with patient.  ?I visited with patient and her daughter they were interested in the Advanced Directive process.  ?I went over the paperwork and answered several of their questions pertaining to the particulars of the AD. Patient expressed tiredness and as soon as we prayed she relaxed and went to sleep.  ? ?Danice Goltz  ?Chaplain  ?[facility]  ?[phone] ?

## 2022-02-04 NOTE — Progress Notes (Signed)
OT Cancellation Note ? ?Patient Details ?Name: [name] [name] ?MRN: [id number] ?DOB: [birth year] ? ? ?Cancelled Treatment:    Reason Eval/Treat Not Completed: Pain limiting ability to participate (attempted x 2) ? ?[name] ?02/04/2022, 2:53 PM ?Nestor Lewandowsky, OTR/L ?Acute Rehabilitation Services ?Pager: [phone] ?Office: [phone]  ?

## 2022-02-04 NOTE — Procedures (Signed)
Cortrak ? ?Person Inserting Tube:  Asencion Islam, RD ?Tube Type:  Cortrak - 43 inches ?Tube Size:  10 ?Tube Location:  Left nare ?Secured by: Dorann Lodge ?Technique Used to Measure Tube Placement:  Marking at nare/corner of mouth ?Cortrak Secured At:  69 cm ? ?Cortrak Tube Team Note: ? ?Consult received to place a Cortrak feeding tube.  ? ?X-ray is required, abdominal x-ray has been ordered by the Cortrak team. Please confirm tube placement before using the Cortrak tube.  ? ?If the tube becomes dislodged please keep the tube and contact the Cortrak team at [email or web address] (password [email or web address]) for replacement.  ?If after hours and replacement cannot be delayed, place a NG tube and confirm placement with an abdominal x-ray.  ? ? ?Lockie Pares., RD, LDN, CNSC ?See AMiON for contact information  ? ? ?

## 2022-02-04 NOTE — Progress Notes (Signed)
PT Cancellation Note ? ?Patient Details ?Name: [name] [name] ?MRN: [id number] ?DOB: 11/15/1950 ? ? ?Cancelled Treatment:    Reason Eval/Treat Not Completed: Pain limiting ability to participate.  Follow up with nursing to get pt seen as her pain permits. ? ? ?Ramond Dial ?02/04/2022, 1:12 PM ? ?Mee Hives, PT PhD ?Acute Rehab Dept. Number: [facility] [phone] and [place] [phone] ? ?

## 2022-02-04 NOTE — Progress Notes (Signed)
PHARMACY - TOTAL PARENTERAL NUTRITION CONSULT NOTE ? ?Indication: Prolonged ileus with new EC fistula on 01/30/22 ? ?Patient Measurements: ?Height: '5\' 7"'$  (170.2 cm) ?Weight: 48.4 kg (106 lb 11.2 oz) ?IBW/kg (Calculated) : 61.6 ?TPN AdjBW (KG): 48.4 ?Body mass index is 16.71 kg/m?. ?Usual Weight: ~115 lbs ? ?Assessment:  ?53 YOF s/p femoral hernia repair with SBR 2/2 gangrene on 3/23.  Noted poor PO intake for about 6 months due to living situation. Daughter reports pt UBW ~115 lbs with wt loss of 20-30 lbs within past few months. Pharmacy consulted to manage TPN for ileus in pt with severe PCM upon admission. ? ?TPN order not charted 4/8 PM but is infusing per RN. ? ?Glucose / Insulin: CBGs low normal, not on SSI ?Electrolytes: K improved to 4.2 (goal >/= 4 for ileus), CoCa 10.1, others WNL ?Renal: SCr < 1, BUN WNL ?Hepatic: LFTs / tbili / TG WNL, albumin < 1.5 ?Intake / Output; MIVF: UOP 2 ml/kg/hr, NG 54m, LBM 4/8. Net +14L  ? ?GI Imaging: ?3/23 Incarcerated L inguinal small-bowel hernia with high-grade SBO ?3/27 KUB - suggestive of postoperative ileus ?4/5 CT - possible EC fistula, hematoma vs seroma, anasarca, liver lesions ?GI Surgeries / Procedures:  ?3/23 femoral hernia repair with SBR ? ?Central access: PICC placed 01/19/22 ?TPN start date: 01/19/22 ? ?Nutritional Goals: RD Estimated Needs ?Total Energy Estimated Needs: 1600 - 1800 ?Total Protein Estimated Needs: 80 - 95 grams ?Total Fluid Estimated Needs: >/= 1.6 L ? ?Current Nutrition:  ?NPO (did not tolerate trickle feeds 4/04 d/t abd distension) ?TPN ? ?Plan:  ?Continue trial cyclic TPN over 20 hrs (36-69 ml/hr, GIR < 5 mcg/kg/min). TPN providing 92g AA and 1665 kCal, meeting 100% of needs ?Electrolytes in TPN: Na 1271m/L, increase K 8021mL, Ca 2mE25m for now, Mag 10mE74m Phos 25mmo37m Cl:Ac 1:1 ?Add standard MVI and trace elements to TPN ?Standard TPN labs on Mon and Thurs ?Monitor CBGs and reduce TPN cycle as able (CBGs at 0800/1500/1700) ? ? ?Thank  you for allowing pharmacy to be a part of this patient?s care. ? ?AdrienArdyth HarpsmD ?Clinical Pharmacist ? ? ? ?

## 2022-02-04 NOTE — TOC Progression Note (Addendum)
Transition of Care (TOC) - Progression Note  ? ? ?Patient Details  ?Name: [name] [name] ?MRN: [id number] ?Date of Birth: [birth year] ? ?Transition of Care (TOC) CM/SW Contact  ?Joanne Chars, LCSW ?Phone Number: ?02/04/2022, 1:39 PM ? ?Clinical Narrative:   CSW spoke with pt daughter Melvinie who had questions about pt still being able to go to [facility] for rehab and also about help with HCPOA.  She was also asking about options for [facility] aide services after SNF.  Pt does want to complete the POA but asked that CSW not call chaplain for a few hours as she is tired. ? ?CSW communicated with Michelle/Ashton who asked for updated clinicals to reconsider for SNF admit.  Clinicals sent.  ? ?1500: Message from Michelle/Ashton.  Due to multiple wound care pts at the facility, she will need to see if they can still accept pt with her wounds closer to the time of DC. ? ?CSW also spoke with Harrie Foreman regarding request for [facility] and she will follow up.   ? ? ? ?Expected Discharge Plan: [place] ?Barriers to Discharge: Continued Medical Work up, SNF Pending bed offer ? ?Expected Discharge Plan and Services ?Expected Discharge Plan: [place] ?In-house Referral: Clinical Social Work ?  ?Post Acute Care Choice: [place] ?Living arrangements for the past 2 months: [place] ?                ?  ?  ?  ?  ?  ?  ?  ?  ?  ?  ? ? ?Social Determinants of Health (SDOH) Interventions ?  ? ?Readmission Risk Interventions ?   ? View : No data to display.  ?  ?  ?  ? ? ?

## 2022-02-04 NOTE — Progress Notes (Signed)
[facility] ?Progress Note ? ?18 Days Post-Op  ?Subjective: ?CC-  ?Only complaint is right rib pain this morning. Denies SOB. Feels a little bloated but denies n/v. Passing a little flatus and had a BM yesterday. ?WBC up 15.8 from 14.1, afebrile. ?No output recorded from fistula and there is not a lot of output in the bag. ? ?Objective: ?Vital signs in last 24 hours: ?Temp:  [97.8 ?F (36.6 ?C)-98.8 ?F (37.1 ?C)] 98.2 ?F (36.8 ?C) (04/10 5643) ?Pulse Rate:  [94-110] 94 (04/10 0837) ?Resp:  [15-29] 20 (04/10 0447) ?BP: (98-120)/(66-82) 99/73 (04/10 3295) ?SpO2:  [97 %-100 %] 100 % (04/10 0837) ?Weight:  [48.4 kg] 48.4 kg (04/10 0422) ?Last BM Date : 02/04/22 ? ?Intake/Output from previous day: ?04/09 0701 - 04/10 0700 ?In: 777.7 [I.V.:630.3; IV Piggyback:147.5] ?Out: 1884 [Urine:1700; Emesis/NG output:50] ?Intake/Output this shift: ?No intake/output data recorded. ? ?PE: ?Gen:  Alert, NAD ?Abd: distended but soft, nontender, pouch over LLQ with thin yellow fluid in bag ? ?Lab Results:  ?Recent Labs  ?  02/03/22 ?0130 02/04/22 ?0430  ?WBC 14.1* 15.8*  ?HGB 8.5* 8.4*  ?HCT 26.2* 26.3*  ?PLT 338 375  ? ?BMET ?Recent Labs  ?  02/03/22 ?0130 02/04/22 ?0430  ?NA 137 135  ?K 3.9 4.2  ?CL 107 105  ?CO2 26 25  ?GLUCOSE 113* 112*  ?BUN 15 15  ?CREATININE 0.49 0.44  ?CALCIUM 8.5* 8.3*  ? ?PT/INR ?No results for input(s): LABPROT, INR in the last 72 hours. ?CMP  ?   ?Component Value Date/Time  ? NA 135 02/04/2022 0430  ? K 4.2 02/04/2022 0430  ? CL 105 02/04/2022 0430  ? CO2 25 02/04/2022 0430  ? GLUCOSE 112 (H) 02/04/2022 0430  ? BUN 15 02/04/2022 0430  ? CREATININE 0.44 02/04/2022 0430  ? CREATININE 0.79 06/03/2018 1103  ? CALCIUM 8.3 (L) 02/04/2022 0430  ? PROT 5.6 (L) 02/04/2022 0430  ? ALBUMIN 1.7 (L) 02/04/2022 0430  ? AST 32 02/04/2022 0430  ? AST 17 06/03/2018 1103  ? ALT 11 02/04/2022 0430  ? ALT 11 06/03/2018 1103  ? ALKPHOS 65 02/04/2022 0430  ? BILITOT 0.6 02/04/2022 0430  ? BILITOT 0.5 06/03/2018 1103  ?  GFRNONAA >60 02/04/2022 0430  ? GFRNONAA >60 06/03/2018 1103  ? GFRAA >60 01/31/2020 2048  ? GFRAA >60 06/03/2018 1103  ? ?Lipase  ?   ?Component Value Date/Time  ? LIPASE <10 (L) 01/17/2022 1416  ? ? ? ? ? ?Studies/Results: ?DG CHEST PORT 1 VIEW ? ?Result Date: 02/04/2022 ?CLINICAL DATA:  Right rib pain, tear tube placement EXAM: PORTABLE CHEST 1 VIEW COMPARISON:  02/02/2022 chest radiograph. FINDINGS: Right PICC terminates at the cavoatrial junction. Enteric tube terminates in the region of the distal stomach. Stable cardiomediastinal silhouette with normal heart size. No pneumothorax. No pleural effusion. Patchy right parahilar lung consolidation is minimally improved. Minimal platelike scarring versus atelectasis in the left mid lung. IMPRESSION: Patchy right parahilar lung consolidation, minimally improved. Minimal platelike scarring versus atelectasis in the left mid lung. Enteric tube terminates in the region of the distal stomach. Electronically Signed   By: Ilona Sorrel M.D.   On: 02/04/2022 09:03  ? ?DG Chest Port 1 View ? ?Result Date: 02/02/2022 ?CLINICAL DATA:  Dyspnea EXAM: PORTABLE CHEST 1 VIEW COMPARISON:  Chest x-ray 01/30/2022, CT chest 01/27/2022. FINDINGS: Interval removal of an endotracheal tube. Right PICC with tip overlying the superior cavoatrial junction. Enteric tube coursing below the hemidiaphragm with tip and side port  collimated off view. The heart and mediastinal contours are unchanged. Persistent right lung interstitial and airspace opacities as well as retrocardiac airspace opacity. No pulmonary edema. Trace fluid within the minor fissure. No pneumothorax. No acute osseous abnormality. IMPRESSION: 1. Persistent right lung interstiti Followup PA and lateral chest X-ray is recommended in 3-4 weeks following therapy to ensure resolution and exclude underlying malignancy. al and airspace opacities as well as retrocardiac airspace opacity. 2. Trace loculated pleural effusion within the minor  fissure. Electronically Signed   By: Iven Finn M.D.   On: 02/02/2022 15:12  ? ?DG Abd Portable 1V ? ?Result Date: 02/04/2022 ?CLINICAL DATA:  Enteric tube placement, right rib pain EXAM: PORTABLE ABDOMEN - 1 VIEW COMPARISON:  01/31/2022 abdominal radiograph FINDINGS: Enteric tube courses through the stomach and terminates in the right abdomen in the region of the pylorus. No evidence of pneumatosis or pneumoperitoneum or dilated small bowel loops in the visualized upper abdomen. Patchy right perihilar lung opacities partially visualized. IMPRESSION: Enteric tube courses through the stomach and terminates in the right abdomen in the region of the pylorus. Please see concurrent chest radiograph report for details in the lungs. Electronically Signed   By: Ilona Sorrel M.D.   On: 02/04/2022 09:09   ? ?Anti-infectives: ?Anti-infectives (From admission, onward)  ? ? Start     Dose/Rate Route Frequency Ordered Stop  ? 01/30/22 1000  micafungin (MYCAMINE) 100 mg in sodium chloride 0.9 % 100 mL IVPB  Status:  Discontinued       ? 100 mg ?110 mL/hr over 1 Hours Intravenous Every 24 hours 01/30/22 0849 02/01/22 1145  ? 01/27/22 1115  piperacillin-tazobactam (ZOSYN) IVPB 3.375 g       ? 3.375 g ?12.5 mL/hr over 240 Minutes Intravenous Every 8 hours 01/27/22 1015    ? 01/26/22 1130  cefTRIAXone (ROCEPHIN) 2 g in sodium chloride 0.9 % 100 mL IVPB  Status:  Discontinued       ? 2 g ?200 mL/hr over 30 Minutes Intravenous Every 24 hours 01/26/22 1035 01/27/22 1015  ? ?  ? ? ? ?Assessment/Plan ?POD#18,S/p emergent left femoral hernia repair with Phasix mesh/ small bowel resection 01/17/22 - Dr. Kieth Brightly for incarcerated left femoral hernia.  Now with EC fistula through incision ?- EC fistula output appears fairly low. Continue Eakins pouch ?- WBC up 15.8, VSS. Continue to monitor, consider repeat CT scan this week if WBC remains elevated ?-cont TNA  ?-cont zosyn ?-she is having some bowel function and does not look super dilated  on xray this morning. d/c NG ?  ?FEN: IVF/TPN/ice chips ?ID: zosyn 4/1 --> ?VTE: SCDs, Lovenox ?  ?Urinary retention - foley out and voiding ?Acute hypoxic respiratory failure secondary to aspiration PNA - appreciate CCM assistance, improved, they have signed off ?Tachycardia/A fib RVR - likely secondary to all of above. ?Parkinson's disease - per medicine, psych, ok for med through NGT at this point ?FTT in adult ?Anxiety  ?Anemia - hgb 8.4, stable ?Severe protein calorie malnutrition - TNA, albumin 1.7 and BMI 17 ? ? ? LOS: 18 days  ? ? ?Wellington [place], PA-C ?[facility] ?02/04/2022, 10:52 AM ?Please see Amion for pager number during day hours 7:00am-4:30pm ? ?

## 2022-02-04 NOTE — Consult Note (Addendum)
[facility] Nurse wound follow up ?Surgical team following for assessment and plan of care.  Eakin pouch to left groin wound is intact with good seal, 50cc yellow liquid from a fistula. No pseudostoma visible at this time. ?Left lower abd near groin fold with full thickness post-op wound has decreased in depth; 5X.3X.3cm, red and moist. ?Applied small Eakin pouch over the location. 3 sets of pouches at the bedside  for staff nurse use.  ?Topical treatment orders provided for bedside nurses to perform as follows: Bedside nurse; please change the small Eakin pouch Kellie Simmering # [phone]) to left groin PRN when leaking. Trace pattern at the bedside, cut out and apply. If it has large amt drainage, then connect to medium wall suction. ?Julien Girt MSN, RN, [place], [place], CNS ?[phone]  ?

## 2022-02-04 NOTE — Progress Notes (Signed)
PT Cancellation Note ? ?Patient Details ?Name: [name] [name] ?MRN: [id number] ?DOB: [birth year] ? ? ?Cancelled Treatment:    Reason Eval/Treat Not Completed: Pain limiting ability to participate;Fatigue/lethargy limiting ability to participate.  Another unsuccessful attempt made with family in the room. ? ? ?Ramond Dial ?02/04/2022, 4:43 PM ? ?Mee Hives, PT PhD ?Acute Rehab Dept. Number: [facility] [phone] and [place] [phone] ? ?

## 2022-02-04 NOTE — Progress Notes (Signed)
Passed to oncoming nurse that patient is having right rib pain 8/10. This is new as of 0700 this morning. No CP, difficulty breathing, or SOB. Daughter requesting a CXR because patient had similar pain in ribs when she had pneumonia. Day shift nurse to contact day MD regarding CXR request.  ?

## 2022-02-05 ENCOUNTER — Inpatient Hospital Stay (HOSPITAL_COMMUNITY): Payer: Medicare HMO

## 2022-02-05 DIAGNOSIS — J9601 Acute respiratory failure with hypoxia: Secondary | ICD-10-CM | POA: Diagnosis not present

## 2022-02-05 DIAGNOSIS — F419 Anxiety disorder, unspecified: Secondary | ICD-10-CM | POA: Diagnosis not present

## 2022-02-05 DIAGNOSIS — R6521 Severe sepsis with septic shock: Secondary | ICD-10-CM

## 2022-02-05 DIAGNOSIS — J690 Pneumonitis due to inhalation of food and vomit: Secondary | ICD-10-CM | POA: Diagnosis not present

## 2022-02-05 DIAGNOSIS — D649 Anemia, unspecified: Secondary | ICD-10-CM

## 2022-02-05 DIAGNOSIS — A419 Sepsis, unspecified organism: Secondary | ICD-10-CM

## 2022-02-05 DIAGNOSIS — K4130 Unilateral femoral hernia, with obstruction, without gangrene, not specified as recurrent: Secondary | ICD-10-CM | POA: Diagnosis not present

## 2022-02-05 DIAGNOSIS — K632 Fistula of intestine: Secondary | ICD-10-CM | POA: Diagnosis present

## 2022-02-05 DIAGNOSIS — E860 Dehydration: Secondary | ICD-10-CM

## 2022-02-05 DIAGNOSIS — L89151 Pressure ulcer of sacral region, stage 1: Secondary | ICD-10-CM

## 2022-02-05 LAB — BASIC METABOLIC PANEL
Anion gap: 5 (ref 5–15)
BUN: 18 mg/dL (ref 8–23)
CO2: 26 mmol/L (ref 22–32)
Calcium: 8.4 mg/dL — ABNORMAL LOW (ref 8.9–10.3)
Chloride: 105 mmol/L (ref 98–111)
Creatinine, Ser: 0.49 mg/dL (ref 0.44–1.00)
GFR, Estimated: >60 mL/min (ref >60)
Glucose, Bld: 110 mg/dL — ABNORMAL HIGH (ref 70–99)
Potassium: 4.2 mmol/L (ref 3.5–5.1)
Sodium: 136 mmol/L (ref 135–145)

## 2022-02-05 LAB — CBC
HCT: 27.1 % — ABNORMAL LOW (ref 36.0–46.0)
Hemoglobin: 8.8 g/dL — ABNORMAL LOW (ref 12.0–15.0)
MCH: 29.8 pg (ref 26.0–34.0)
MCHC: 32.5 g/dL (ref 30.0–36.0)
MCV: 91.9 fL (ref 80.0–100.0)
Platelets: 416 10*3/uL — ABNORMAL HIGH (ref 150–400)
RBC: 2.95 MIL/uL — ABNORMAL LOW (ref 3.87–5.11)
RDW: 13.9 % (ref 11.5–15.5)
WBC: 15.3 10*3/uL — ABNORMAL HIGH (ref 4.0–10.5)
nRBC: 0 % (ref 0.0–0.2)

## 2022-02-05 LAB — D-DIMER, QUANTITATIVE: D-Dimer, Quant: 4.48 ug/mL-FEU — ABNORMAL HIGH (ref 0.00–0.50)

## 2022-02-05 LAB — GLUCOSE, CAPILLARY
Glucose-Capillary: 101 mg/dL — ABNORMAL HIGH (ref 70–99)
Glucose-Capillary: 106 mg/dL — ABNORMAL HIGH (ref 70–99)
Glucose-Capillary: 113 mg/dL — ABNORMAL HIGH (ref 70–99)
Glucose-Capillary: 118 mg/dL — ABNORMAL HIGH (ref 70–99)
Glucose-Capillary: 118 mg/dL — ABNORMAL HIGH (ref 70–99)
Glucose-Capillary: 138 mg/dL — ABNORMAL HIGH (ref 70–99)
Glucose-Capillary: 70 mg/dL (ref 70–99)

## 2022-02-05 MED ORDER — MAGIC MOUTHWASH W/LIDOCAINE
2.0000 mL | Freq: Three times a day (TID) | ORAL | Status: DC | PRN
  Administered 2022-02-05: 2 mL via ORAL
  Filled 2022-02-05 (×2): qty 5

## 2022-02-05 MED ORDER — SODIUM CHLORIDE 0.9 % IV SOLN: INTRAVENOUS | Status: DC

## 2022-02-05 MED ORDER — QUETIAPINE FUMARATE 25 MG PO TABS
50.0000 mg | ORAL_TABLET | Freq: Every day | ORAL | Status: AC
  Administered 2022-02-05 – 2022-02-07 (×3): 50 mg
  Filled 2022-02-05 (×3): qty 2

## 2022-02-05 MED ORDER — SODIUM CHLORIDE 0.9 % IV SOLN
INTRAVENOUS | Status: AC
Start: 2022-02-05 — End: 2022-02-05

## 2022-02-05 MED ORDER — CARBIDOPA-LEVODOPA 25-250 MG PO TABS
1.0000 | ORAL_TABLET | Freq: Four times a day (QID) | ORAL | Status: DC
  Administered 2022-02-05 – 2022-02-08 (×11): 1
  Filled 2022-02-05 (×13): qty 1

## 2022-02-05 MED ORDER — QUETIAPINE FUMARATE 25 MG PO TABS: 25.0000 mg | ORAL_TABLET | Freq: Every evening | ORAL | Status: DC | PRN

## 2022-02-05 MED ORDER — QUETIAPINE FUMARATE 25 MG PO TABS: 25.0000 mg | ORAL_TABLET | Freq: Every day | ORAL | Status: DC

## 2022-02-05 MED ORDER — HYDROMORPHONE HCL 1 MG/ML IJ SOLN
0.2500 mg | INTRAMUSCULAR | Status: DC | PRN
Start: 2022-02-05 — End: 2022-02-14
  Administered 2022-02-05 – 2022-02-07 (×2): 0.25 mg via INTRAVENOUS
  Filled 2022-02-05 (×2): qty 0.5

## 2022-02-05 MED ORDER — TRACE MINERALS CU-MN-SE-ZN 300-55-60-3000 MCG/ML IV SOLN
INTRAVENOUS | Status: AC
  Filled 2022-02-05: qty 612.27

## 2022-02-05 MED ORDER — QUETIAPINE FUMARATE 25 MG PO TABS: 50.0000 mg | ORAL_TABLET | Freq: Every day | ORAL | Status: DC

## 2022-02-05 MED ORDER — SODIUM CHLORIDE 0.9 % IV SOLN: INTRAVENOUS | Status: AC

## 2022-02-05 NOTE — Progress Notes (Signed)
PHARMACY - TOTAL PARENTERAL NUTRITION CONSULT NOTE ? ?Indication: Prolonged ileus with new EC fistula on 01/30/22 ? ?Patient Measurements: ?Height: '5\' 7"'$  (170.2 cm) ?Weight: 48.4 kg (106 lb 11.2 oz) ?IBW/kg (Calculated) : 61.6 ?TPN AdjBW (KG): 48.4 ?Body mass index is 16.71 kg/m?. ?Usual Weight: ~115 lbs ? ?Assessment:  ?42 YOF s/p femoral hernia repair with SBR 2/2 gangrene on 3/23.  Noted poor PO intake for about 6 months due to living situation. Daughter reports pt UBW ~115 lbs with wt loss of 20-30 lbs within past few months. Pharmacy consulted to manage TPN for ileus in pt with severe PCM upon admission. ? ?Glucose / Insulin: CBG 77 off TPN, 100-110s on TPN.  Not on SSI. ?Electrolytes: all WNL ?Renal: SCr < 1, BUN WNL ?Hepatic: LFTs / tbili / TG WNL, albumin 1.7 ?Intake / Output; MIVF: UOP 0.4 ml/kg/hr, NG 30m, LBM 4/10. Net +14L  ? ?GI Imaging: ?3/23 Incarcerated L inguinal small-bowel hernia with high-grade SBO ?3/27 KUB - suggestive of postoperative ileus ?4/5 CT - possible EC fistula, hematoma vs seroma, anasarca, liver lesions ?GI Surgeries / Procedures:  ?3/23 femoral hernia repair with SBR ? ?Central access: PICC placed 01/19/22 ?TPN start date: 01/19/22 ? ?Nutritional Goals: RD Estimated Needs ?Total Energy Estimated Needs: 1600 - 1800 ?Total Protein Estimated Needs: 80 - 95 grams ?Total Fluid Estimated Needs: >/= 1.6 L ? ?Current Nutrition:  ?NPO (did not tolerate trickle feeds 4/04 d/t abd distension) ?TPN ? ?Plan:  ?Continue trial cyclic TPN over 20 hrs (36-69 ml/hr, GIR < 5 mcg/kg/min) ?TPN providing 92g AA and 1665 kCal, meeting 100% of needs ?Electrolytes in TPN: Na 1270m/L, K 8024mL, Ca 2mE14m, Mag 10mE6m Phos 25mmo33m Cl:Ac 1:1 ?Add standard MVI and trace elements to TPN ?Standard TPN labs on Mon and Thurs ?Monitor CBGs and reduce TPN cycle in AM if CBG acceptable off TPN for one more check (CBGs at 0800/[id number] D. Khiara Shuping, Mina MarblemD, BCPS, BCCCP ?02/05/2022, 7:14 AM ? ? ? ? ?

## 2022-02-05 NOTE — Evaluation (Signed)
Clinical/Bedside Swallow Evaluation ?Patient Details  ?Name: [name] [name] ?MRN: [id number] ?Date of Birth: [birth year] ? ?Today's Date: 02/05/2022 ?Time: SLP Start Time (ACUTE ONLY): 8101 SLP Stop Time (ACUTE ONLY): 1110 ?SLP Time Calculation (min) (ACUTE ONLY): 36 min ? ?Past Medical History:  ?Past Medical History:  ?Diagnosis Date  ? Fibroid   ? GERD (gastroesophageal reflux disease)   ? Muscle weakness of lower extremity 01/17/2015  ? Osteopenia   ? Parkinson disease ([facility]) 01/17/2015  ? ?Past Surgical History:  ?Past Surgical History:  ?Procedure Laterality Date  ? FINGER SURGERY    ? INGUINAL HERNIA REPAIR Left 01/17/2022  ? Procedure: Left femoral hernia repair with mesh and small bowel resection and anastomosis;  Surgeon: Kieth Brightly, Arta Bruce, MD;  Location: [facility];  Service: General;  Laterality: Left;  ? MOUTH SURGERY    ? TOE SURGERY    ? TONSILLECTOMY    ? TUBAL LIGATION    ? x2  ? ?HPI:  ?71 yo female with 1 day of worsening abdominal pain and vomiting, s/p femoral hernia repair with SBR 2/2 gangrene on 3/23. Noted poor po intake for about 6 months due to living situation. Daughter reports pt with wt loss of 20-30# within past few months. SLP initially ordered as pt became acutely more dysarthric, hypophonic this admission. She was then intubated after an episode of emesis with concern for aspiration PNA. ETT 4/1-4/7. Significant PMH: Parkinson's disease (prior course of OP SLP in 2018 for dysarthria/voice, d/c after all goals were met), anxiety  ?  ?Assessment / Plan / Recommendation  ?Clinical Impression ? Pt's voice is hoarse, but subjectively a little more audible compared to when she was last seen by this SLP prior to intubation. Mildly asymmetric nasolabial flattening noted but stable compared to earlier evaluation as well. She has multiple swallows with ice chips that she attributes to not having her teeth and therefore having to swallow a little bit at a time since she can't chew them. When taking  small spoonfuls of water she coughed only once, but overall she is hesitant to trying much PO despite encouragement from SLP and reinforcement from RN that it was okay to try. Would encourage ice chips after oral care to facilitate use of swallowing musculature and provide moisture to the oral cavity and pharynx. Will f/u for potential to complete MBS, which would likely be beneficial considering her h/o PD as well as more acute risk factors including prolonged intubation and deconditioning. ?SLP Visit Diagnosis: Dysphagia, unspecified (R13.10) ?   ?Aspiration Risk ? Mild aspiration risk  ?  ?Diet Recommendation Ice chips PRN after oral care  ? ?Medication Administration: Via alternative means  ?  ?Other  Recommendations Oral Care Recommendations: Oral care QID;Oral care prior to ice chip/H20   ? ?Recommendations for follow up therapy are one component of a multi-disciplinary discharge planning process, led by the attending physician.  Recommendations may be updated based on patient status, additional functional criteria and insurance authorization. ? ?Follow up Recommendations Skilled nursing-short term rehab (<3 hours/day)  ? ? ?  ?Assistance Recommended at Discharge Intermittent Supervision/Assistance  ?Functional Status Assessment Patient has had a recent decline in their functional status and demonstrates the ability to make significant improvements in function in a reasonable and predictable amount of time.  ?Frequency and Duration min 2x/week  ?2 weeks ?  ?   ? ?Prognosis Prognosis for Safe Diet Advancement: Good  ? ?  ? ?Swallow Study   ?General HPI: 71 yo  female with 1 day of worsening abdominal pain and vomiting, s/p femoral hernia repair with SBR 2/2 gangrene on 3/23. Noted poor po intake for about 6 months due to living situation. Daughter reports pt with wt loss of 20-30# within past few months. SLP initially ordered as pt became acutely more dysarthric, hypophonic this admission. She was then intubated  after an episode of emesis with concern for aspiration PNA. ETT 4/1-4/7. Significant PMH: Parkinson's disease (prior course of OP SLP in 2018 for dysarthria/voice, d/c after all goals were met), anxiety ?Type of Study: Bedside Swallow Evaluation ?Previous Swallow Assessment: none in chart ?Diet Prior to this Study: NPO;NG Tube ?Temperature Spikes Noted: No ?Respiratory Status: Room air ?History of Recent Intubation: Yes ?Length of Intubations (days): 6 days ?Date extubated: 02/01/22 ?Behavior/Cognition: Alert;Cooperative;Pleasant mood ?Oral Cavity Assessment: Within Functional Limits ?Oral Care Completed by SLP: No ?Oral Cavity - Dentition: Edentulous ?Vision: Functional for self-feeding ?Self-Feeding Abilities: Able to feed self ?Patient Positioning: Upright in bed ?Baseline Vocal Quality: Not observed ?Volitional Cough: Weak ?Volitional Swallow: Able to elicit  ?  ?Oral/Motor/Sensory Function Overall Oral Motor/Sensory Function: Mild impairment (baseline compared to earlier this admission) ?Facial ROM: Reduced left;Suspected CN VII (facial) dysfunction ?Facial Symmetry: Abnormal symmetry left;Suspected CN VII (facial) dysfunction ?Facial Strength: Reduced left;Suspected CN VII (facial) dysfunction ?Lingual ROM: Within Functional Limits ?Lingual Symmetry: Within Functional Limits   ?Ice Chips Ice chips: Impaired ?Presentation: Self Fed;Spoon ?Pharyngeal Phase Impairments: Multiple swallows   ?Thin Liquid Thin Liquid: Impaired ?Presentation: Spoon ?Pharyngeal  Phase Impairments: Cough - Immediate  ?  ?Nectar Thick Nectar Thick Liquid: Not tested   ?Honey Thick Honey Thick Liquid: Not tested   ?Puree Puree: Not tested   ?Solid ? ? ?  Solid: Not tested  ? ?  ? ?Osie Bond., M.A. CCC-SLP ?Acute Rehabilitation Services ?Office [phone] ? ?Secure chat preferred ? ?02/05/2022,11:23 AM ? ? ? ?

## 2022-02-05 NOTE — Assessment & Plan Note (Addendum)
Patient was noted to be tachycardic with poor skin turgor on physical exam. ?-Give 515m normal saline IV fluids. ? ?

## 2022-02-05 NOTE — Progress Notes (Signed)
Patient's daughter Allina Riches is requesting a call back tonight from [place] at [phone]. ? ?Both daughters are having strong concerns regarding mother's care. ? ?Tonight's order for CT of chest regarding elevated D-Dimer has daughter's extremely concerned and is now requesting a call back tonight from Administrator. ? ?Will contact AC and relay message. ? ?AC will contact daughter Randa Evens at [phone] who is legal guardian. ? ?

## 2022-02-05 NOTE — Progress Notes (Signed)
[facility] ?Progress Note ? ?19 Days Post-Op  ?Subjective: ?Minimal output from fistula. Patient continues to report pain along the right costal margin. Afebrile. Having bowel movements. Fistula output remains minimal. ? ?Objective: ?Vital signs in last 24 hours: ?Temp:  [97.8 ?F (36.6 ?C)-98.2 ?F (36.8 ?C)] 97.8 ?F (36.6 ?C) (04/11 0756) ?Pulse Rate:  [90-109] 99 (04/11 0756) ?Resp:  [16-19] 16 (04/11 0409) ?BP: (87-106)/(59-71) 98/66 (04/11 0756) ?SpO2:  [98 %-100 %] 98 % (04/11 0756) ?Last BM Date : 02/05/22 ? ?Intake/Output from previous day: ?04/10 0701 - 04/11 0700 ?In: 815.2 [I.V.:665.2; IV Piggyback:150] ?Out: 500 [Urine:450] ?Intake/Output this shift: ?No intake/output data recorded. ? ?PE: ?Gen:  Alert, NAD ?Resp: normal work of breathing, point tenderness to palpation along the right inferior chest wall, no palpable abnormalities. ?Abd: mildly distended but soft, nontender, pouch over LLQ with thin bilious fluid in bag. ? ?Lab Results:  ?Recent Labs  ?  02/04/22 ?0430 02/05/22 ?0450  ?WBC 15.8* 15.3*  ?HGB 8.4* 8.8*  ?HCT 26.3* 27.1*  ?PLT 375 416*  ? ?BMET ?Recent Labs  ?  02/04/22 ?0430 02/05/22 ?0450  ?NA 135 136  ?K 4.2 4.2  ?CL 105 105  ?CO2 25 26  ?GLUCOSE 112* 110*  ?BUN 15 18  ?CREATININE 0.44 0.49  ?CALCIUM 8.3* 8.4*  ? ?PT/INR ?No results for input(s): LABPROT, INR in the last 72 hours. ?CMP  ?   ?Component Value Date/Time  ? NA 136 02/05/2022 0450  ? K 4.2 02/05/2022 0450  ? CL 105 02/05/2022 0450  ? CO2 26 02/05/2022 0450  ? GLUCOSE 110 (H) 02/05/2022 0450  ? BUN 18 02/05/2022 0450  ? CREATININE 0.49 02/05/2022 0450  ? CREATININE 0.79 06/03/2018 1103  ? CALCIUM 8.4 (L) 02/05/2022 0450  ? PROT 5.6 (L) 02/04/2022 0430  ? ALBUMIN 1.7 (L) 02/04/2022 0430  ? AST 32 02/04/2022 0430  ? AST 17 06/03/2018 1103  ? ALT 11 02/04/2022 0430  ? ALT 11 06/03/2018 1103  ? ALKPHOS 65 02/04/2022 0430  ? BILITOT 0.6 02/04/2022 0430  ? BILITOT 0.5 06/03/2018 1103  ? GFRNONAA >60 02/05/2022 0450  ?  GFRNONAA >60 06/03/2018 1103  ? GFRAA >60 01/31/2020 2048  ? GFRAA >60 06/03/2018 1103  ? ?Lipase  ?   ?Component Value Date/Time  ? LIPASE <10 (L) 01/17/2022 1416  ? ? ? ? ? ?Studies/Results: ?DG CHEST PORT 1 VIEW ? ?Result Date: 02/04/2022 ?CLINICAL DATA:  Right rib pain, tear tube placement EXAM: PORTABLE CHEST 1 VIEW COMPARISON:  02/02/2022 chest radiograph. FINDINGS: Right PICC terminates at the cavoatrial junction. Enteric tube terminates in the region of the distal stomach. Stable cardiomediastinal silhouette with normal heart size. No pneumothorax. No pleural effusion. Patchy right parahilar lung consolidation is minimally improved. Minimal platelike scarring versus atelectasis in the left mid lung. IMPRESSION: Patchy right parahilar lung consolidation, minimally improved. Minimal platelike scarring versus atelectasis in the left mid lung. Enteric tube terminates in the region of the distal stomach. Electronically Signed   By: Ilona Sorrel M.D.   On: 02/04/2022 09:03  ? ?DG Abd Portable 1V ? ?Result Date: 02/04/2022 ?CLINICAL DATA:  Feeding tube placement. EXAM: PORTABLE ABDOMEN - 1 VIEW COMPARISON:  Abdominal x-ray 02/04/2022. FINDINGS: Feeding tube tip terminates in the region of the pylorus. Bowel-gas pattern is nonobstructive. There is gaseous distention of the colon. IMPRESSION: 1. Feeding tube tip terminates at the region of the pylorus. Electronically Signed   By: Ronney Asters M.D.   On: 02/04/2022  19:10  ? ?DG Abd Portable 1V ? ?Result Date: 02/04/2022 ?CLINICAL DATA:  Enteric tube placement, right rib pain EXAM: PORTABLE ABDOMEN - 1 VIEW COMPARISON:  01/31/2022 abdominal radiograph FINDINGS: Enteric tube courses through the stomach and terminates in the right abdomen in the region of the pylorus. No evidence of pneumatosis or pneumoperitoneum or dilated small bowel loops in the visualized upper abdomen. Patchy right perihilar lung opacities partially visualized. IMPRESSION: Enteric tube courses  through the stomach and terminates in the right abdomen in the region of the pylorus. Please see concurrent chest radiograph report for details in the lungs. Electronically Signed   By: Ilona Sorrel M.D.   On: 02/04/2022 09:09   ? ?Anti-infectives: ?Anti-infectives (From admission, onward)  ? ? Start     Dose/Rate Route Frequency Ordered Stop  ? 01/30/22 1000  micafungin (MYCAMINE) 100 mg in sodium chloride 0.9 % 100 mL IVPB  Status:  Discontinued       ? 100 mg ?110 mL/hr over 1 Hours Intravenous Every 24 hours 01/30/22 0849 02/01/22 1145  ? 01/27/22 1115  piperacillin-tazobactam (ZOSYN) IVPB 3.375 g       ? 3.375 g ?12.5 mL/hr over 240 Minutes Intravenous Every 8 hours 01/27/22 1015    ? 01/26/22 1130  cefTRIAXone (ROCEPHIN) 2 g in sodium chloride 0.9 % 100 mL IVPB  Status:  Discontinued       ? 2 g ?200 mL/hr over 30 Minutes Intravenous Every 24 hours 01/26/22 1035 01/27/22 1015  ? ?  ? ? ? ?Assessment/Plan ?POD#18,S/p emergent left femoral hernia repair with Phasix mesh/ small bowel resection 01/17/22 - Dr. Kieth Brightly for incarcerated left femoral hernia.  Now with EC fistula through incision ?- EC fistula output is low. Continue Eakins pouch ?- WBC stable but elevated at 15. Will rescan tomorrow (day 7 from last scan) if persistent leukocytosis to evaluate for undrained intraabdominal collections. ?-cont TNA  ?-cont zosyn ?-NG removed and replaced with a cortrak for med administration. Remain NPO. ?  ?FEN: IVF/TPN/ice chips ?ID: zosyn 4/1 --> ?VTE: SCDs, Lovenox ?  ?Urinary retention - foley out and voiding ?Acute hypoxic respiratory failure secondary to aspiration PNA - improved. Most recent CXR shows improving right pulmonary infiltrates. ?Tachycardia/A fib RVR - improved ?Parkinson's disease - per medicine, psych, ok for med through NGT at this point ?FTT in adult ?Anxiety  ?Anemia - hgb stable ?Severe protein calorie malnutrition - TNA, albumin 1.7 and BMI 17 ? ?Consult hospitalist today for assistance with  medical comorbidities. ? ? ? LOS: 19 days  ? ? ?Dwan Bolt, MD ?[facility] ?02/05/2022, 11:11 AM ?Please see Amion for pager number during day hours 7:00am-4:30pm ? ?

## 2022-02-05 NOTE — Progress Notes (Signed)
Physical Therapy Treatment ?Patient Details ?Name: [name] [name] ?MRN: [id number] ?DOB: 04-Dec-1950 ?Today's Date: 02/05/2022 ? ? ?History of Present Illness Pt is a 71 y.o. female admitted 01/17/22 with worsening abdominal pain and vomiting; daughter reports poor intake for ~6 months due to living situation, with 20-30# weight loss. S/p L femoral hernia repair with small bowel resection secondary to gangrene 3/23. Transfer to MICU 4/1 secondary to aspiration requiring intubation; ETT 4/1-4/7. PMH includes Parkinson's disease, anxiety. ?  ?PT Comments  ? ? Pt slowly progressing with mobility. Pt anxious regarding mobility, requiring mod-maxA+2 for bed mobility and standing trials; demonstrates good participation in supine and seated BLE therex. Pt remains limited by generalized weakness, decreased activity tolerance, poor balance strategies/postural reactions and impaired cognition. Continue to recommend SNF-level therapies to maximize functional mobility and independence prior to return home. ?   ?Recommendations for follow up therapy are one component of a multi-disciplinary discharge planning process, led by the attending physician.  Recommendations may be updated based on patient status, additional functional criteria and insurance authorization. ? ?Follow Up Recommendations ? Skilled nursing-short term rehab (<3 hours/day) ?  ?  ?Assistance Recommended at Discharge Frequent or constant Supervision/Assistance  ?Patient can return home with the following Two people to help with walking and/or transfers;A lot of help with bathing/dressing/bathroom;Assistance with feeding;Help with stairs or ramp for entrance;Assist for transportation ?  ?Equipment Recommendations ? Wheelchair cushion (measurements PT);Wheelchair (measurements PT);Hospital bed;Hoyer lift ?  ?Recommendations for Other Services   ? ? ?  ?Precautions / Restrictions Precautions ?Precautions: Fall;Other (comment) ?Precaution Comments: cortrak, L abdominal  drain, watch HR ?Restrictions ?Weight Bearing Restrictions: No  ?  ? ?Mobility ? Bed Mobility ?Overal bed mobility: Needs Assistance ?Bed Mobility: Supine to Sit, Sit to Supine ?  ?  ?Supine to sit: Mod assist, +2 for physical assistance, HOB elevated ?Sit to supine: Max assist, +2 for physical assistance ?  ?General bed mobility comments: step by step cues for sequencing, pt able to assist with BUE support on rails, modA for BLE management, min-modA for trunk elevation, increased time and effort to achieve upright posture ?  ? ?Transfers ?Overall transfer level: Needs assistance ?Equipment used: 2 person hand held assist ?Transfers: Sit to/from Stand ?Sit to Stand: Mod assist, +2 physical assistance, From elevated surface ?  ?  ?  ?  ?  ?General transfer comment: modA+2 for trunk elevation, blocked bilateral feet from sliding due to knee hyperextension against EOB; heavy posterior bias and difficulty with anterior weight translation, unable to take steps; maxA for eccentric control to sit ?  ? ?Ambulation/Gait ?  ?  ?  ?  ?  ?  ?  ?  ? ? ?Stairs ?  ?  ?  ?  ?  ? ? ?Wheelchair Mobility ?  ? ?Modified Rankin (Stroke Patients Only) ?  ? ? ?  ?Balance Overall balance assessment: Needs assistance ?Sitting-balance support: Feet supported, Single extremity supported ?Sitting balance-Leahy Scale: Fair ?Sitting balance - Comments: initially reliant on UE support and external assist, progressing to static sitting without UE support and min guard ?  ?Standing balance support: Bilateral upper extremity supported ?Standing balance-Leahy Scale: Poor ?Standing balance comment: reliant on BUE support and external assist; posterior bias ?  ?  ?  ?  ?  ?  ?  ?  ?  ?  ?  ?  ? ?  ?Cognition Arousal/Alertness: Awake/alert, Lethargic ?Behavior During Therapy: Flat affect ?Overall Cognitive Status: Difficult to assess ?  ?  ?  ?  ?  ?  ?  ?  ?  ?  ?  ?  ?  ?  ?  ?  ?  General Comments: increased time to follow commands, anxious regarding  mobility but responds well to encouragement and step by step instructions. cues to keep eyes open and speak up. pt trying to demo some humor ?  ?  ? ?  ?Exercises General Exercises - Lower Extremity ?Ankle Circles/Pumps: AROM, Both, Supine ?Long Arc Quad: AROM, Both, Seated ?Heel Slides: AROM, Both, Supine ?Hip ABduction/ADduction: AROM, Both, Supine ?Straight Leg Raises: AROM, Both, Supine ? ?  ?General Comments General comments (skin integrity, edema, etc.): HR 110s, SpO2 99% on RA; supine BP 98/66, chair position BP 103/79, sitting EOB BP 120/77, return to supine BP 104/68 ?  ?  ? ?Pertinent Vitals/Pain Pain Assessment ?Pain Assessment: No/denies pain ?Pain Intervention(s): Monitored during session  ? ? ?Home Living   ?  ?  ?  ?  ?  ?  ?  ?  ?  ?   ?  ?Prior Function    ?  ?  ?   ? ?PT Goals (current goals can now be found in the care plan section) Progress towards PT goals: Progressing toward goals (slowly) ? ?  ?Frequency ? ? ? Min 3X/week ? ? ? ?  ?PT Plan Current plan remains appropriate  ? ? ?Co-evaluation   ?  ?  ?  ?  ? ?  ?AM-PAC PT "6 Clicks" Mobility   ?Outcome Measure ? Help needed turning from your back to your side while in a flat bed without using bedrails?: A Lot ?Help needed moving from lying on your back to sitting on the side of a flat bed without using bedrails?: A Lot ?Help needed moving to and from a bed to a chair (including a wheelchair)?: Total ?Help needed standing up from a chair using your arms (e.g., wheelchair or bedside chair)?: Total ?Help needed to walk in hospital room?: Total ?Help needed climbing 3-5 steps with a railing? : Total ?6 Click Score: 8 ? ?  ?End of Session Equipment Utilized During Treatment: Gait belt ?Activity Tolerance: Patient tolerated treatment well;Patient limited by fatigue ?Patient left: with call bell/phone within reach;in bed;with bed alarm set;with SCD's reapplied (in modified chair position) ?Nurse Communication: Mobility status ?PT Visit Diagnosis:  Muscle weakness (generalized) (M62.81);Difficulty in walking, not elsewhere classified (R26.2) ?  ? ? ?Time: 7017-7939 ?PT Time Calculation (min) (ACUTE ONLY): 29 min ? ?Charges:  $Therapeutic Exercise: 8-22 mins          ?          ? ?Mabeline Caras, PT, DPT ?Acute Rehabilitation Services  ?Pager [phone] ?Office [phone] ? ?Derry Lory ?02/05/2022, 10:33 AM ? ?

## 2022-02-05 NOTE — Progress Notes (Signed)
Occupational Therapy Treatment ?Patient Details ?Name: [name] [name] ?MRN: [id number] ?DOB: [birth year] ?Today's Date: 02/05/2022 ? ? ?History of present illness Pt is a 71 y.o. female admitted 01/17/22 with worsening abdominal pain and vomiting; daughter reports poor intake for ~6 months due to living situation, with 20-30# weight loss. S/p L femoral hernia repair with small bowel resection secondary to gangrene 3/23. Transfer to MICU 4/1 secondary to aspiration requiring intubation; ETT 4/1-4/7. PMH includes Parkinson's disease, anxiety. ?  ?OT comments ? Pt educated in benefits of therapy/mobilizing and responded favorable to starting with bed level exercises and progressing to sitting EOB and eventually standing with +2 mod assist at EOB. VSS throughout session. Pt fatigued at end of session, stating she was sleepy. Left pt positioned in with bed in semi-chair position. Continues to be appropriate for SNF level rehab.    ? ?Recommendations for follow up therapy are one component of a multi-disciplinary discharge planning process, led by the attending physician.  Recommendations may be updated based on patient status, additional functional criteria and insurance authorization. ?   ?Follow Up Recommendations ? Skilled nursing-short term rehab (<3 hours/day)  ?  ?Assistance Recommended at Discharge Frequent or constant Supervision/Assistance  ?Patient can return home with the following ? A lot of help with bathing/dressing/bathroom;A lot of help with walking and/or transfers ?  ?Equipment Recommendations ? Other (comment) (defer to next venue)  ?  ?Recommendations for Other Services   ? ?  ?Precautions / Restrictions Precautions ?Precautions: Fall;Other (comment) ?Precaution Comments: cortrak, L abdominal drain, watch HR ?Restrictions ?Weight Bearing Restrictions: No  ? ? ?  ? ?Mobility Bed Mobility ?Overal bed mobility: Needs Assistance ?Bed Mobility: Supine to Sit, Sit to Supine ?  ?  ?Supine to sit: Mod assist, +2  for physical assistance, HOB elevated ?Sit to supine: Max assist, +2 for physical assistance ?  ?General bed mobility comments: step by step cues for sequencing, pt able to assist with BUE support on rails, modA for BLE management, min-modA for trunk elevation, increased time and effort to achieve upright posture ?  ? ?Transfers ?Overall transfer level: Needs assistance ?Equipment used: 2 person hand held assist ?Transfers: Sit to/from Stand ?Sit to Stand: Mod assist, +2 physical assistance, From elevated surface ?  ?  ?  ?  ?  ?General transfer comment: modA+2 for trunk elevation, blocked bilateral feet from sliding due to knee hyperextension against EOB; heavy posterior bias and difficulty with anterior weight translation, unable to take steps; maxA for eccentric control to sit ?  ?  ?Balance Overall balance assessment: Needs assistance ?Sitting-balance support: Feet supported, Single extremity supported ?Sitting balance-Leahy Scale: Fair ?Sitting balance - Comments: initially reliant on UE support and external assist, progressing to static sitting without UE support and min guard ?  ?Standing balance support: Bilateral upper extremity supported ?Standing balance-Leahy Scale: Poor ?Standing balance comment: reliant on BUE support and external assist; posterior bias ?  ?  ?  ?  ?  ?  ?  ?  ?  ?  ?  ?   ? ?ADL either performed or assessed with clinical judgement  ? ?ADL Overall ADL's : Needs assistance/impaired ?  ?  ?  ?  ?Upper Body Bathing: Total assistance;Sitting ?Upper Body Bathing Details (indicate cue type and reason): washed pt's back in sitting ?  ?  ?  ?  ?Lower Body Dressing: Total assistance;Bed level ?Lower Body Dressing Details (indicate cue type and reason): for socks ?  ?  ?  ?  ?  ?  ?  ?  ?  ? ?  Extremity/Trunk Assessment   ?  ?  ?  ?  ?  ? ?Vision   ?Additional Comments: wears glasses, asked for them during session ?  ?Perception   ?  ?Praxis   ?  ? ?Cognition Arousal/Alertness:  Awake/alert ?Behavior During Therapy: Flat affect ?Overall Cognitive Status: Impaired/Different from baseline ?  ?  ?  ?  ?  ?  ?  ?  ?  ?  ?  ?  ?  ?  ?  ?  ?General Comments: increased time to follow commands, anxious regarding mobility but responds well to encouragement and step by step instructions. cues to keep eyes open and speak up. pt trying to demo some humor ?  ?  ?   ?Exercises Exercises: General Upper Extremity ?General Exercises - Upper Extremity ?Shoulder Flexion: AROM, Both, 10 reps, Supine ?Elbow Flexion: Strengthening, Both, 10 reps, Supine ?Elbow Extension: Strengthening, Both, 10 reps, Supine ?Digit Composite Flexion: Squeeze ball, Both, 5 reps ? ?  ?Shoulder Instructions   ? ? ?  ?General Comments HR 110s, SpO2 99% on RA; supine BP 98/66, chair position BP 103/79, sitting EOB BP 120/77, return to supine BP 104/68  ? ? ?Pertinent Vitals/ Pain       Pain Assessment ?Pain Assessment: No/denies pain ?Pain Location: premedicated ?Pain Intervention(s): Monitored during session ? ?Home Living   ?  ?  ?  ?  ?  ?  ?  ?  ?  ?  ?  ?  ?  ?  ?  ?  ?  ?  ? ?  ?Prior Functioning/Environment    ?  ?  ?  ?   ? ?Frequency ? Min 2X/week  ? ? ? ? ?  ?Progress Toward Goals ? ?OT Goals(current goals can now be found in the care plan section) ? Progress towards OT goals: Progressing toward goals ? ?Acute Rehab OT Goals ?OT Goal Formulation: With patient/family ?Time For Goal Achievement: 02/19/22 ?Potential to Achieve Goals: Good ?ADL Goals ?Pt Will Perform Grooming: with min assist;sitting ?Pt Will Perform Upper Body Bathing: with min assist;sitting ?Pt Will Perform Lower Body Bathing: with mod assist;sit to/from stand ?Pt Will Perform Upper Body Dressing: with mod assist;sitting ?Pt Will Transfer to Toilet: with mod assist;stand pivot transfer;bedside commode ?Pt Will Perform Toileting - Clothing Manipulation and hygiene: with mod assist;sitting/lateral leans  ?Plan Discharge plan remains appropriate    ? ?Co-evaluation ? ? ?   ?  ?  ?  ?  ? ?  ?AM-PAC OT "6 Clicks" Daily Activity     ?Outcome Measure ? ? Help from another person eating meals?: Total ?Help from another person taking care of personal grooming?: A Lot ?Help from another person toileting, which includes using toliet, bedpan, or urinal?: Total ?Help from another person bathing (including washing, rinsing, drying)?: Total ?Help from another person to put on and taking off regular upper body clothing?: A Lot ?Help from another person to put on and taking off regular lower body clothing?: Total ?6 Click Score: 8 ? ?  ?End of Session   ? ?OT Visit Diagnosis: Unsteadiness on feet (R26.81);Muscle weakness (generalized) (M62.81);Other abnormalities of gait and mobility (R26.89) ?  ?Activity Tolerance Patient tolerated treatment well ?  ?Patient Left in bed;with call bell/phone within reach;with bed alarm set ?  ?Nurse Communication Mobility status ?  ? ?   ? ?Time: 0928-1000 ?OT Time Calculation (min): 32 min ? ?Charges: OT General Charges ?$OT Visit: 1 Visit ?OT Treatments ?$Therapeutic  Activity: 8-22 mins ? ?[name] [name], OTR/L ?Acute Rehabilitation Services ?Pager: [phone] ?Office: [phone]  ?[name] [name] ?02/05/2022, 11:41 AM ?

## 2022-02-05 NOTE — Assessment & Plan Note (Signed)
Hemoglobin relatively stable at 8.8 g/dl. ?-continue to monitor ?

## 2022-02-05 NOTE — Progress Notes (Addendum)
Consultation Progress Note ? ? ?Patient: [name] [name] DHR:[id number] DOB: 08/29/1951 DOA: 01/17/2022 ?DOS: the patient was seen and examined on 02/05/2022 ?Primary service: Edison Pace, Md, MD ? ?Brief hospital course: ?[name] [name] is a 71 y.o. female with past medical history of Parkinson's disease and anxiety who presented on 3/23 with abdominal pain, admitted for incarcerated left femoral hernia requiring open repair and resection of necrotic small bowel 3/23.  Hospital course complicated by postoperative ileus started on TPN.  PCCM consulted on 4/1 after patient reportedly had emesis the night prior due to worsening hypoxemia and respiratory distress.  Chest x-ray significant for concern for aspiration pneumonia for which patient ultimately required intubation.  Antibiotics have been widened to treat for aspiration in addition to the patient being placed on pressors 4/2.  Pressors were able to be weaned off 4/4.  CT scan of the abdomen from 4/5 noted EC fistula for which micafungin was started.   ? 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  Postoperative fistula now resolved.  CT of the abdomen pelvis 4/5 concerning for enterocutaneous fistula.  Fistula output is decreased.  Plan for possible repeat CT of abdomen tomorrow. ?-Continuing antibiotics of Zosyn ?-Management per surgery ? ?Septic shock secondary to acute respiratory failure with hypoxia (HCC) secondary to aspiration pneumonia ?Resolving.  Patient required intubation due to acute respiratory failure with hypoxia on 4/1 after having episode of vomiting the night before.  X-ray imaging concerning for aspiration pneumonia.  Patient was under the care of PCCM at that time.  Placed on expanded antibiotics including micafungin.  Extubated on 4/7.  Most recent chest x-ray on 4/10 showing improving infiltrates.  Currently O2  saturations maintained on room air.  She is noted to be tachycardic with white blood cell count still elevated at 15. ?-Continue Robitussin-DM ?-Flutter valve ?-Check D-dimer given tachycardia although patient has been on DVT prophylaxis   ?-DuoNebs as needed for shortness of breath/wheezing ?-Continue Zosyn ? ?Parkinson disease ([place]) ?Patient reports increased rigidity at around 10 PM at night.  Current regimen of Sinemet IR 25- 250 mg 3 times daily with last dose at 1700. ?-Trial of increasing Sinemet to 4 times daily with last dose to be given at 10 PM ? ?Failure to thrive in adult ?-Severely malnourished baseline clearly predates her acute illness. BMI is < 15.  ?-Palliative have been consulted, currently plan is for aggressive measures. ?-Continue TPN ?-Cortrak was placed ?-Speech therapy consulted and following ? ?Anxiety ?Patient had been on buspar '5mg'$  BID and valium '2mg'$  (though this was prn vertigo) in the outpatient setting.   ?- Appreciate psychiatry evaluations. Initiated low dose remeron in order to address anxiety, insomnia, and to stimulate appetite.  Medications ultimately had to be held due to acute respiratory failure. ?-Currently on Seroquel 25 mg nightly.  Increase dose to 50 mg nightly to see if patient able to obtain better sleep.  We will need to continue this medication if helps is 3 doses ordered. ? ?Normocytic anemia ?Hemoglobin relatively stable at 8.8 g/dl. ?-continue to monitor ? ?Dehydration ?Patient was noted to be tachycardic with poor skin turgor on physical exam. ?-Give 532m normal saline IV fluids. ? ? ?Pressure injury of coccygeal region, stage 1 ?POA, offload and optimize nutritional status as much as possible.  ? ? ? ? ? ? ? ?TRH will continue to follow the patient. ? ?Subjective: Patient's daughter  is present at bedside to help with additional history.  She reports that the patient is still having right-sided pain which has been most notable in the morning when she wakes up  rating it as a 9 out of 10 on the pain scale.  Patient complains of difficulty taking a deep breath in due to pain on the right side as well as some tenderness to palpation there whenever she is not taking pain medications.  Patient still reports having mostly dry cough.  Daughter asked if Seroquel dose can be increased as she is not sleeping at night, but reported that the Seroquel 25 mg dose seemed to help.  If the additional dose was made as needed the patient's not asking for it so it needed to be scheduled.  Lastly, the patient reportedly complains of getting more rigid usually around 10 PM at night.  Her last dose of Sinemet is usually at 5 PM and wonders if an additional dose can be given around 10 PM. ? ?Physical Exam: ?Vitals:  ? 02/04/22 1529 02/04/22 2300 02/05/22 0409 02/05/22 0756  ?BP: 106/71 (!) 87/59 (!) 88/66 98/66  ?Pulse: (!) 109 100 90 99  ?Resp:  19 16   ?Temp: 98.1 ?F (36.7 ?C) 98.2 ?F (36.8 ?C) 98 ?F (36.7 ?C) 97.8 ?F (36.6 ?C)  ?TempSrc: Oral Oral Oral Oral  ?SpO2: 100% 99% 100% 98%  ?Weight:      ?Height:      ? ?Constitutional: Cachectic elderly female currently in no distress ?Eyes: PERRL, lids and conjunctivae normal ?ENMT: Mucous membranes are dry [place] present of the left nare. ?Neck: normal, supple, no masses, no thyromegaly ?Respiratory: Decreased overall aeration with some intermittent rales noted of the right lung field.   ?Cardiovascular: Tachycardic.  No tenderness palpation of the right lower chest wall at this time. ?Abdomen: no tenderness, fistula drain present of the left lower abdomen. ?Musculoskeletal: no clubbing / cyanosis. No joint deformity upper and lower extremities.   ?Skin: Poor skin turgor. ?Neurologic: CN 2-12 grossly intact.  Strength 4/5.  Weak voice. ?Psychiatric: Normal judgment and insight. Alert and oriented x 3. Normal mood.  ? ?Data Reviewed: ? ?Reviewed labs and imaging as noted above. ? ?Family Communication: Daughter updated at bedside ? ?Time  spent: 45 minutes. ? ?Author: ?Norval Morton, MD ?02/05/2022 10:49 AM ? ?For on call review [email or web address].  ?

## 2022-02-05 NOTE — Progress Notes (Addendum)
Message from pt's nurse asking if PICC is power injectable. Yes. She voiced understanding. ?2330: Reviewed orders, noted chest CT angio ordered. PIV started for CT. ?

## 2022-02-05 NOTE — Progress Notes (Signed)
Patient report persistent right-sided chest pain.  Check D-dimer which was noted to be elevated at 4.48.  Discussed imaging with Dr. Bobbye Morton and orders placed for CT angiogram of the chest as well as CT abdomen pelvis with IV and oral contrast.  TRH floor coverage to follow-up on CT imaging. ?

## 2022-02-05 NOTE — Progress Notes (Signed)
Nutrition Follow-up ? ?DOCUMENTATION CODES:  ?Severe malnutrition in context of social or environmental circumstances, Underweight ? ?INTERVENTION:  ?Continue TPN to meet 100% of estimated needs. ? ?NUTRITION DIAGNOSIS:  ?Severe Malnutrition related to social / environmental circumstances as evidenced by severe muscle depletion, severe fat depletion, percent weight loss. ?Ongoing  ? ?GOAL:  ?Patient will meet greater than or equal to 90% of their needs ?Met with TPN. ? ?MONITOR:  ?Diet advancement, Labs, Weight trends ? ?REASON FOR ASSESSMENT:  ?Malnutrition Screening Tool ?  ? ?ASSESSMENT:  ?71 y.o. female presented to the ED with abdominal pain with vomiting and constipation for 7 days. PMH includes Parkinson disease and GERD. Pt admitted with incarcerated left inguinal hernia w/ obstructions. ? ?3/23 S/P resection of necrotic small bowel. ?3/25 TPN initiated d/t post-op ileus.  ?3/29 NG tube removed.  ?3/30 Started on clear liquids. ?3/31 Advanced to full liquids. ?4/1 Transferred to the ICU and intubated. NG tube was replaced. ?4/4 Trial of trickle feedings: Vital AF 1.2 at 20 ml/h.  ?4/5 Trickle feedings stopped d/t abdominal distention; CT scan showed EC fistula. ?4/7 extubated ?4/10 NGT replaced with Cortrak (tip at region of the pylorus per xray) ? ?Discussed pt with PA -- no plan to begin TF via Cortrak at this time d/t EC fistula through incision. Cortrak was placed for medication administration given it is more comfortable than previous NGT. Note pt is having bowel movements and has had minimal output from fistula.  ? ?Patient continues to receive TPN. Currently receiving cyclic TPN over 20 hrs (36-69 ml/hr, GIR < 5 mcg/kg/min) ?TPN providing 92g AA and 1665 kCal, meeting 100% of needs ? ?UOP: 419ml x24 hours ?EC Fistula: 17ml x24 hours ?I/O: +14.4L since admit  ? ?Admission weight 53.1 kg ?Current weight 48.4 kg ? ?Medications: ? vitamin C  500 mg Per Tube Daily  ? carbidopa-levodopa  1 tablet Per Tube  TID  ? pantoprazole sodium  40 mg Per Tube Daily  ? ?Labs: ?Recent Labs  ?Lab 01/31/22 ?0615 02/01/22 ?0417 02/01/22 ?0750 02/02/22 ?1019 02/03/22 ?0130 02/04/22 ?0430 02/05/22 ?0450  ?NA 134*   < >  --    < > 137 135 136  ?K 4.0   < >  --    < > 3.9 4.2 4.2  ?CL 101   < >  --    < > 107 105 105  ?CO2 25   < >  --    < > $R'26 25 26  'Pr$ ?BUN 18   < >  --    < > $R'15 15 18  'dp$ ?CREATININE 0.43*   < >  --    < > 0.49 0.44 0.49  ?CALCIUM 8.3*   < >  --    < > 8.5* 8.3* 8.4*  ?MG 1.8  --  2.1  --   --  2.0  --   ?PHOS 3.3  --  3.5  --   --  3.3  --   ?GLUCOSE 108*   < >  --    < > 113* 112* 110*  ? < > = values in this interval not displayed.  ?CBGs: 77-118 x24 hours ?Hgb 8.8  ? ?Diet Order:   ?Diet Order   ? ?       ?  Diet NPO time specified Except for: Ice Chips  Diet effective now       ?  ? ?  ?  ? ?  ? ?EDUCATION NEEDS:  ?No  education needs have been identified at this time ? ?Skin:  Skin Assessment: Reviewed RN Assessment ?Skin Integrity Issues:: Stage II ?Stage I: - ?Stage II: sacrum ? ?Last BM:  4/11 type 7 ? ?Height:  ?Ht Readings from Last 1 Encounters:  ?02/03/22 $RemoveBe'5\' 7"'[id number]$  (1.702 m)  ? ?Weight:  ?Wt Readings from Last 1 Encounters:  ?02/04/22 48.4 kg  ? ?Ideal Body Weight:  61.4 kg ? ?BMI:  Body mass index is 16.71 kg/m?. ? ?Estimated Nutritional Needs:  ?Kcal:  1600 - 1800 ?Protein:  80 - 95 grams ?Fluid:  >/= 1.6 L ? ? ?Theone Stanley., MS, RD, LDN (she/her/hers) ?RD pager number and weekend/on-call pager number located in [place].                                               ? ?

## 2022-02-05 NOTE — Assessment & Plan Note (Addendum)
Resolving.  Patient required intubation due to acute respiratory failure with hypoxia on 4/1 after having episode of vomiting the night before.  X-ray imaging concerning for aspiration pneumonia.  Patient was under the care of PCCM at that time.  Placed on expanded antibiotics including micafungin.  Extubated on 4/7.  Most recent chest x-ray on 4/10 showing improving infiltrates.  Currently O2 saturations maintained on room air.  She is noted to be tachycardic with white blood cell count still elevated at 15. ?-Continue Robitussin-DM ?-Flutter valve ?-Check D-dimer given tachycardia although patient has been on DVT prophylaxis   ?-DuoNebs as needed for shortness of breath/wheezing ?-Continue Zosyn ?

## 2022-02-05 NOTE — Plan of Care (Signed)
?  Problem: Elimination: ?Goal: Will not experience complications related to bowel motility ?Outcome: Progressing ?  ?Problem: Safety: ?Goal: Ability to remain free from injury will improve ?Outcome: Progressing ?  ?Problem: Skin Integrity: ?Goal: Risk for impaired skin integrity will decrease ?Outcome: Progressing ?  ?Problem: Education: ?Goal: Required Educational Video(s) ?Outcome: Progressing ?  ?

## 2022-02-06 DIAGNOSIS — K4130 Unilateral femoral hernia, with obstruction, without gangrene, not specified as recurrent: Secondary | ICD-10-CM | POA: Diagnosis not present

## 2022-02-06 LAB — CBC
HCT: 25.9 % — ABNORMAL LOW (ref 36.0–46.0)
Hemoglobin: 8.2 g/dL — ABNORMAL LOW (ref 12.0–15.0)
MCH: 29.2 pg (ref 26.0–34.0)
MCHC: 31.7 g/dL (ref 30.0–36.0)
MCV: 92.2 fL (ref 80.0–100.0)
Platelets: 398 10*3/uL (ref 150–400)
RBC: 2.81 MIL/uL — ABNORMAL LOW (ref 3.87–5.11)
RDW: 13.8 % (ref 11.5–15.5)
WBC: 13.4 10*3/uL — ABNORMAL HIGH (ref 4.0–10.5)
nRBC: 0 % (ref 0.0–0.2)

## 2022-02-06 LAB — BASIC METABOLIC PANEL
Anion gap: 6 (ref 5–15)
BUN: 18 mg/dL (ref 8–23)
CO2: 22 mmol/L (ref 22–32)
Calcium: 8 mg/dL — ABNORMAL LOW (ref 8.9–10.3)
Chloride: 105 mmol/L (ref 98–111)
Creatinine, Ser: 0.44 mg/dL (ref 0.44–1.00)
GFR, Estimated: >60 mL/min (ref >60)
Glucose, Bld: 100 mg/dL — ABNORMAL HIGH (ref 70–99)
Potassium: 4.1 mmol/L (ref 3.5–5.1)
Sodium: 133 mmol/L — ABNORMAL LOW (ref 135–145)

## 2022-02-06 LAB — GLUCOSE, CAPILLARY
Glucose-Capillary: 115 mg/dL — ABNORMAL HIGH (ref 70–99)
Glucose-Capillary: 105 mg/dL — ABNORMAL HIGH (ref 70–99)
Glucose-Capillary: 132 mg/dL — ABNORMAL HIGH (ref 70–99)
Glucose-Capillary: 85 mg/dL (ref 70–99)

## 2022-02-06 MED ORDER — TRACE MINERALS CU-MN-SE-ZN 300-55-60-3000 MCG/ML IV SOLN
INTRAVENOUS | Status: DC
  Filled 2022-02-06: qty 611.04

## 2022-02-06 MED ORDER — TRACE MINERALS CU-MN-SE-ZN 300-55-60-3000 MCG/ML IV SOLN: INTRAVENOUS | Status: DC

## 2022-02-06 MED ORDER — IOHEXOL 350 MG/ML SOLN
100.0000 mL | Freq: Once | INTRAVENOUS | Status: AC | PRN
  Administered 2022-02-06: 100 mL via INTRAVENOUS

## 2022-02-06 MED ORDER — TRACE MINERALS CU-MN-SE-ZN 300-55-60-3000 MCG/ML IV SOLN
INTRAVENOUS | Status: AC
  Filled 2022-02-06: qty 611.07

## 2022-02-06 NOTE — Progress Notes (Signed)
Physical Therapy Treatment ?Patient Details ?Name: [name] [name] ?MRN: [id number] ?DOB: 22-Jul-1951 ?Today's Date: 02/06/2022 ? ? ?History of Present Illness Pt is a 71 y.o. female admitted 01/17/22 with worsening abdominal pain and vomiting; daughter reports poor intake for ~6 months due to living situation, with 20-30# weight loss. S/p L femoral hernia repair with small bowel resection secondary to gangrene 3/23. Transfer to MICU 4/1 secondary to aspiration requiring intubation; ETT 4/1-4/7. PMH includes Parkinson's disease, anxiety. ?  ?PT Comments  ? ? Pt progressing with mobility, improved affect and interaction today. Today's session focused on OOB mobility, pt tolerated step pivot transfer with min-modA+2, quick to fatigue. Pt remains limited by generalized weakness, decreased activity tolerance, poor balance strategies/postural reactions and impaired cognition. Continue to recommend SNF-level therapies to maximize functional mobility and independence prior to return home. ?   ?Recommendations for follow up therapy are one component of a multi-disciplinary discharge planning process, led by the attending physician.  Recommendations may be updated based on patient status, additional functional criteria and insurance authorization. ? ?Follow Up Recommendations ? Skilled nursing-short term rehab (<3 hours/day) - would consider AIR if activity tolerance improves ?  ?  ?Assistance Recommended at Discharge Frequent or constant Supervision/Assistance  ?Patient can return home with the following A lot of help with bathing/dressing/bathroom;Assistance with feeding;Help with stairs or ramp for entrance;Assist for transportation;A lot of help with walking and/or transfers ?  ?Equipment Recommendations ? Wheelchair cushion (measurements PT);Wheelchair (measurements PT);Hospital bed; TBD-lift equipment  ?  ?Recommendations for Other Services   ? ? ?  ?Precautions / Restrictions Precautions ?Precautions: Fall;Other  (comment) ?Precaution Comments: cortrak, L abdominal drain; anxious with mobility ?Restrictions ?Weight Bearing Restrictions: No  ?  ? ?Mobility ? Bed Mobility ?Overal bed mobility: Needs Assistance ?Bed Mobility: Supine to Sit ?  ?  ?Supine to sit: Mod assist, +2 for safety/equipment, HOB elevated ?  ?  ?General bed mobility comments: pt seems to do well getting "warmed up" with BUE/BLE therex in bed before initiating bed mobility; improved initiation of full body movement towards EOB, pt requiring minA for scooting hips, modA for trunk elevation with heavy use of bed rail ?  ? ?Transfers ?Overall transfer level: Needs assistance ?Equipment used: Rolling walker (2 wheels) ?Transfers: Sit to/from Stand ?Sit to Stand: Mod assist, +2 physical assistance ?  ?Step pivot transfers: Min assist, +2 physical assistance, +2 safety/equipment ?  ?  ?  ?General transfer comment: heavy modA for trunk elevation to RW, increased time and effort achieve fully upright posture and achieve anterior weight translation with mod cues; minA+2 for pivotal steps to recliner, increased time with repeated verbal cues for sequencing; additional sit<>stand from recliner with modA+2, pt with increased fatigue ?  ? ?Ambulation/Gait ?  ?  ?  ?  ?  ?  ?  ?  ? ? ?Stairs ?  ?  ?  ?  ?  ? ? ?Wheelchair Mobility ?  ? ?Modified Rankin (Stroke Patients Only) ?  ? ? ?  ?Balance Overall balance assessment: Needs assistance ?Sitting-balance support: No upper extremity supported, Feet supported ?Sitting balance-Leahy Scale: Fair ?Sitting balance - Comments: improved time to gain static sitting balance EOB, requiring modA for scooting hips to EOB ?  ?Standing balance support: Bilateral upper extremity supported, Reliant on assistive device for balance ?Standing balance-Leahy Scale: Poor ?Standing balance comment: reliant on BUE support and external assist; posterior bias ?  ?  ?  ?  ?  ?  ?  ?  ?  ?  ?  ?  ? ?  ?  Cognition Arousal/Alertness:  Awake/alert ?Behavior During Therapy: [facility] for tasks assessed/performed, Flat affect ?Overall Cognitive Status: Impaired/Different from baseline ?  ?  ?  ?  ?  ?  ?  ?  ?  ?  ?  ?  ?  ?  ?  ?  ?General Comments: pt with improved affect and interaction this session, speech quality slightly stronger, more conversational, although this fading some with fatigue; increased time to follow commands, anxious regarding mobility but responds well to encouragement and step by step instructions ?  ?  ? ?  ?Exercises General Exercises - Lower Extremity ?Ankle Circles/Pumps: AROM, Both, Supine ?Heel Slides: AROM, Both, Supine ?Hip ABduction/ADduction: AROM, Both, Supine ?Straight Leg Raises: AROM, Both, Supine ? ?  ?General Comments General comments (skin integrity, edema, etc.): Pt's friend Fraser Din) present and supportive, pt seemed happy to have her visiting. Pt c/o feeling hot with sitting EOB, post-standing transfer BP 113/73; HR noted up to 113 ?  ?  ? ?Pertinent Vitals/Pain Pain Assessment ?Pain Assessment: No/denies pain ?Pain Intervention(s): Monitored during session  ? ? ?Home Living   ?  ?  ?  ?  ?  ?  ?  ?  ?  ?   ?  ?Prior Function    ?  ?  ?   ? ?PT Goals (current goals can now be found in the care plan section) Progress towards PT goals: Progressing toward goals ? ?  ?Frequency ? ? ? Min 3X/week ? ? ? ?  ?PT Plan Current plan remains appropriate  ? ? ?Co-evaluation   ?  ?  ?  ?  ? ?  ?AM-PAC PT "6 Clicks" Mobility   ?Outcome Measure ? Help needed turning from your back to your side while in a flat bed without using bedrails?: A Lot ?Help needed moving from lying on your back to sitting on the side of a flat bed without using bedrails?: A Lot ?Help needed moving to and from a bed to a chair (including a wheelchair)?: A Lot ?Help needed standing up from a chair using your arms (e.g., wheelchair or bedside chair)?: A Lot ?Help needed to walk in hospital room?: Total ?Help needed climbing 3-5 steps with a railing? :  Total ?6 Click Score: 10 ? ?  ?End of Session Equipment Utilized During Treatment: Gait belt ?Activity Tolerance: Patient tolerated treatment well;Patient limited by fatigue ?Patient left: in chair;with call bell/phone within reach;with chair alarm set;with family/visitor present ?Nurse Communication: Mobility status ?PT Visit Diagnosis: Muscle weakness (generalized) (M62.81);Difficulty in walking, not elsewhere classified (R26.2) ?  ? ? ?Time: 1157-2620 ?PT Time Calculation (min) (ACUTE ONLY): 27 min ? ?Charges:  $Therapeutic Activity: 8-22 mins          ?          ? ?Mabeline Caras, PT, DPT ?Acute Rehabilitation Services  ?Pager [phone] ?Office [phone] ? ?Derry Lory ?02/06/2022, 12:56 PM ? ?

## 2022-02-06 NOTE — TOC Progression Note (Signed)
Transition of Care (TOC) - Progression Note  ? ? ?Patient Details  ?Name: DESHAWNDA ACREY ?MRN: [id number] ?Date of Birth: 08-14-1951 ? ?Transition of Care (TOC) CM/SW Contact  ?Joanne Chars, LCSW ?Phone Number: ?02/06/2022, 3:39 PM ? ?Clinical Narrative:   CSW met with pt and daughter Melvinie to discuss possibility of LTAC, discussed that hospitalist may be getting involved and will need to see if MDs are recommending this.  They are both open to this possibility.  ? ? ? ?Expected Discharge Plan: [place] (LTAC) ?Barriers to Discharge: Continued Medical Work up, SNF Pending bed offer ? ?Expected Discharge Plan and Services ?Expected Discharge Plan: [place] (LTAC) ?In-house Referral: Clinical Social Work ?  ?Post Acute Care Choice: [place] ?Living arrangements for the past 2 months: [place] ?                ?  ?  ?  ?  ?  ?  ?  ?  ?  ?  ? ? ?Social Determinants of Health (SDOH) Interventions ?  ? ?Readmission Risk Interventions ?   ? View : No data to display.  ?  ?  ?  ? ? ?

## 2022-02-06 NOTE — Progress Notes (Signed)
Patient's daughter Randa Evens is requesting MD to contact her at [phone] to go over the results of the CT scan. ?

## 2022-02-06 NOTE — Progress Notes (Signed)
PHARMACY - TOTAL PARENTERAL NUTRITION CONSULT NOTE ? ?Indication: Prolonged ileus with new EC fistula on 01/30/22 ? ?Patient Measurements: ?Height: '5\' 7"'$  (170.2 cm) ?Weight: 48.4 kg (106 lb 11.2 oz) ?IBW/kg (Calculated) : 61.6 ?TPN AdjBW (KG): 48.4 ?Body mass index is 16.71 kg/m?. ?Usual Weight: ~115 lbs ? ?Assessment:  ?[name] s/p femoral hernia repair with SBR 2/2 gangrene on 3/23.  Noted poor PO intake for about 6 months due to living situation. Daughter reports pt UBW ~115 lbs with wt loss of 20-30 lbs within past few months. Pharmacy consulted to manage TPN for ileus in pt with severe PCM upon admission. ? ?Glucose / Insulin: CBG 70s off TPN, 100-130s on TPN.  Not on SSI. ?Electrolytes: low Na, others WNL ?Renal: SCr < 1, BUN WNL ?Hepatic: LFTs / tbili / TG WNL, albumin 1.7 ?Intake / Output; MIVF: UOP 0.3 ml/kg/hr, LBM 4/11. Net +13L  ? ?GI Imaging: ?3/23 Incarcerated L inguinal small-bowel hernia with high-grade SBO ?3/27 KUB - suggestive of postoperative ileus ?4/5 CT - possible EC fistula, hematoma vs seroma, anasarca, liver lesions ?4/12 CT: post-op obstruction vs ileus, large hepatic hemangioma, L inguinal region increased in size but could be post-op changes, multi fibroid uterus, negative for PE ?GI Surgeries / Procedures:  ?3/23 femoral hernia repair with SBR ? ?Central access: PICC placed 01/19/22 ?TPN start date: 01/19/22 ? ?Nutritional Goals: RD Estimated Needs ?Total Energy Estimated Needs: 1600 - 1800 ?Total Protein Estimated Needs: 80 - 95 grams ?Total Fluid Estimated Needs: >/= 1.6 L ? ?Current Nutrition:  ?NPO (did not tolerate trickle feeds 4/04 d/t abd distension) ?TPN ? ?Plan:  ?Continue cyclic TPN over 20 hrs (36-21m/hr, GIR < 5 mcg/kg/min) ?TPN providing 92g AA and 1670 kCal, meeting 100% of needs ?No plan to cycle TPN further for now given low normal CBGs off of TPN ?Electrolytes in TPN: increase Na 1553m/L, K 8066mL, Ca 2mE47m, Mag 10mE12m Phos 25mmo75m Cl:Ac 1:1 ?Add standard MVI and  trace elements to TPN ?Continue twice daily CBGs to monitor for hypoglycemia off TPN (CBGs at 1500/1700) ?Standard TPN labs on Mon and Thurs ? ?[name] [name] D. [name] [name], [name] [name], BCPS, BCCCP ?02/06/2022, 7:29 AM ? ?

## 2022-02-06 NOTE — Progress Notes (Signed)
Occupational Therapy Treatment ?Patient Details ?Name: [name] [name] ?MRN: [id number] ?DOB: 06/11/1951 ?Today's Date: 02/06/2022 ? ? ?History of present illness Pt is a 71 y.o. female admitted 01/17/22 with worsening abdominal pain and vomiting; daughter reports poor intake for ~6 months due to living situation, with 20-30# weight loss. S/p L femoral hernia repair with small bowel resection secondary to gangrene 3/23. Transfer to MICU 4/1 secondary to aspiration requiring intubation; ETT 4/1-4/7. PMH includes Parkinson's disease, anxiety. ?  ?OT comments ? Pt readily willing to work with therapy, but remains fearful of falling and fatigues easily. VSS throughout session. Completed warm up exercises in bed. Achieved sitting EOB with min assist and increased time. Stood with RW and 2 person mod assist and transferred to chair with +2 min assist. Pt seemed pleased with her progress, wanted friend to take her picture in the chair. Pt may be appropriate for AIR as her endurance improves.   ? ?Recommendations for follow up therapy are one component of a multi-disciplinary discharge planning process, led by the attending physician.  Recommendations may be updated based on patient status, additional functional criteria and insurance authorization. ?   ?Follow Up Recommendations ? Skilled nursing-short term rehab (<3 hours/day)  ?  ?Assistance Recommended at Discharge Frequent or constant Supervision/Assistance  ?Patient can return home with the following ? A lot of help with bathing/dressing/bathroom;A lot of help with walking and/or transfers ?  ?Equipment Recommendations ? Other (comment) (defer to next venue)  ?  ?Recommendations for Other Services   ? ?  ?Precautions / Restrictions Precautions ?Precautions: Fall;Other (comment) ?Precaution Comments: cortrak, L abdominal drain; anxious with mobility ?Restrictions ?Weight Bearing Restrictions: No  ? ? ?  ? ?Mobility Bed Mobility ?Overal bed mobility: Needs Assistance ?Bed  Mobility: Supine to Sit ?  ?  ?Supine to sit: Mod assist, +2 for safety/equipment ?  ?  ?General bed mobility comments: min assist with use of bed pad to scoot hips to EOB ?  ? ?Transfers ?Overall transfer level: Needs assistance ?Equipment used: Rolling walker (2 wheels) ?Transfers: Sit to/from Stand ?Sit to Stand: Mod assist, +2 physical assistance ?  ?  ?Step pivot transfers: Min assist, +2 physical assistance, +2 safety/equipment ?  ?  ?General transfer comment: heavy modA for trunk elevation to RW, increased time and effort achieve fully upright posture and achieve anterior weight translation with mod cues; minA+2 for pivotal steps to recliner, increased time with repeated verbal cues for sequencing; additional sit<>stand from recliner with modA+2, pt with increased fatigue ?  ?  ?Balance Overall balance assessment: Needs assistance ?Sitting-balance support: No upper extremity supported, Feet supported ?Sitting balance-Leahy Scale: Fair ?Sitting balance - Comments: improved time to gain static sitting balance EOB, requiring modA for scooting hips to EOB ?  ?Standing balance support: Bilateral upper extremity supported, Reliant on assistive device for balance ?Standing balance-Leahy Scale: Poor ?Standing balance comment: reliant on BUE support and external assist; posterior bias ?  ?  ?  ?  ?  ?  ?  ?  ?  ?  ?  ?   ? ?ADL either performed or assessed with clinical judgement  ? ?ADL Overall ADL's : Needs assistance/impaired ?  ?  ?  ?  ?  ?  ?  ?  ?  ?  ?Lower Body Dressing: Total assistance;Bed level ?Lower Body Dressing Details (indicate cue type and reason): for socks ?Toilet Transfer: +2 for physical assistance;Minimal assistance;Stand-pivot;Rolling walker (2 wheels) ?Toilet Transfer Details (indicate cue type  and reason): simulated to chair ?  ?  ?  ?  ?  ?  ?  ? ?Extremity/Trunk Assessment   ?  ?  ?  ?  ?  ? ?Vision   ?  ?  ?Perception   ?  ?Praxis   ?  ? ?Cognition Arousal/Alertness: Awake/alert ?Behavior  During Therapy: [facility] for tasks assessed/performed, Flat affect ?Overall Cognitive Status: Impaired/Different from baseline ?Area of Impairment: Following commands, Problem solving ?  ?  ?  ?  ?  ?  ?  ?  ?  ?  ?  ?Following Commands: Follows one step commands with increased time ?  ?  ?Problem Solving: Slow processing, Decreased initiation, Requires verbal cues ?  ?  ?  ?   ?Exercises General Exercises - Upper Extremity ?Shoulder Flexion: AROM, Both, 10 reps, Supine ? ?  ?Shoulder Instructions   ? ? ?  ?General Comments Pt's friend Fraser Din) present and supportive, pt seemed happy to have her visiting. Pt c/o feeling hot with sitting EOB, post-standing transfer BP 113/73; HR noted up to 113  ? ? ?Pertinent Vitals/ Pain       Pain Assessment ?Pain Assessment: Faces ?Faces Pain Scale: No hurt ? ?Home Living   ?  ?  ?  ?  ?  ?  ?  ?  ?  ?  ?  ?  ?  ?  ?  ?  ?  ?  ? ?  ?Prior Functioning/Environment    ?  ?  ?  ?   ? ?Frequency ? Min 2X/week  ? ? ? ? ?  ?Progress Toward Goals ? ?OT Goals(current goals can now be found in the care plan section) ? Progress towards OT goals: Progressing toward goals ? ?Acute Rehab OT Goals ?OT Goal Formulation: With patient/family ?Time For Goal Achievement: 02/19/22 ?Potential to Achieve Goals: Good  ?Plan Discharge plan remains appropriate   ? ?Co-evaluation ? ? ?   ?  ?  ?  ?  ? ?  ?AM-PAC OT "6 Clicks" Daily Activity     ?Outcome Measure ? ? Help from another person eating meals?: Total ?Help from another person taking care of personal grooming?: A Little ?Help from another person toileting, which includes using toliet, bedpan, or urinal?: Total ?Help from another person bathing (including washing, rinsing, drying)?: A Lot ?Help from another person to put on and taking off regular upper body clothing?: A Lot ?Help from another person to put on and taking off regular lower body clothing?: Total ?6 Click Score: 10 ? ?  ?End of Session Equipment Utilized During Treatment: Rolling walker (2  wheels);Gait belt ? ?OT Visit Diagnosis: Unsteadiness on feet (R26.81);Muscle weakness (generalized) (M62.81);Other abnormalities of gait and mobility (R26.89) ?  ?Activity Tolerance Patient tolerated treatment well ?  ?Patient Left in chair;with call bell/phone within reach;with chair alarm set;with family/visitor present ?  ?Nurse Communication Mobility status;Other (comment) (needs purewick) ?  ? ?   ? ?Time: 5701-7793 ?OT Time Calculation (min): 27 min ? ?Charges: OT General Charges ?$OT Visit: 1 Visit ?OT Treatments ?$Therapeutic Activity: 8-22 mins ?Nestor Lewandowsky, OTR/L ?Acute Rehabilitation Services ?Pager: [phone] ?Office: [phone]  ? ?[name] ?02/06/2022, 1:49 PM ?

## 2022-02-06 NOTE — TOC Progression Note (Signed)
Transition of Care (TOC) - Progression Note  ? ? ?Patient Details  ?Name: SORCHA ROTUNNO ?MRN: [id number] ?Date of Birth: Dec 31, 1950 ? ?Transition of Care (TOC) CM/SW Contact  ?Joanne Chars, LCSW ?Phone Number: ?02/06/2022, 3:41 PM ? ?Clinical Narrative:   CSW spoke with pt in room, daughter Melvinie by phone, discussed [facility]  Daughter would like to research before making decision.  ? ? ? ?Expected Discharge Plan: [place] (LTAC) ?Barriers to Discharge: Continued Medical Work up, SNF Pending bed offer ? ?Expected Discharge Plan and Services ?Expected Discharge Plan: [place] (LTAC) ?In-house Referral: Clinical Social Work ?  ?Post Acute Care Choice: [place] ?Living arrangements for the past 2 months: [place] ?                ?  ?  ?  ?  ?  ?  ?  ?  ?  ?  ? ? ?Social Determinants of Health (SDOH) Interventions ?  ? ?Readmission Risk Interventions ?   ? View : No data to display.  ?  ?  ?  ? ? ?

## 2022-02-06 NOTE — Progress Notes (Signed)
On call MD notified regarding STAT CT scan results are now available. ?

## 2022-02-06 NOTE — Progress Notes (Signed)
Speech Language Pathology Treatment: Dysphagia  ?Patient Details ?Name: [name] [name] ?MRN: [id number] ?DOB: Mar 22, 1951 ?Today's Date: 02/06/2022 ?Time: 1962-2297 ?SLP Time Calculation (min) (ACUTE ONLY): 18 min ? ?Assessment / Plan / Recommendation ?Clinical Impression ? Pt was seen for dysphagia treatment with her friend pt. Pt was seen immediately after PT and OT, and she was seated upright in recliner upon SLP's arrival. Vocal intensity was reduced and this impacted speech intelligibility, but pt reported that it is improved compared to yesterday. Pt's friend stated that her voice is softer than at baseline. Pt demonstrated coughing with ice chips, but tolerated thin liquids and purees without overt s/sx of aspiration. Pt reported that she is still slightly fearful of having difficulty swallowing. Secondary swallows were noted with a larger bolus of puree, suggesting possible residue. Considering prolonged intubation, and parkinson's disease, a modified barium swallow study is recommended to further assess swallow function. Pt was initially amenable to this and it was therefore scheduled with radiology, but within 5 minutes pt stated that she has done too much today, is too exhausted to complete the study today, and would therefore prefer that it be completed tomorrow. Pt may continue to have ice chips after oral care until the study is completed.  ?  ?HPI HPI: 71 yo female with 1 day of worsening abdominal pain and vomiting, s/p femoral hernia repair with SBR 2/2 gangrene on 3/23. Noted poor po intake for about 6 months due to living situation. Daughter reports pt with wt loss of 20-30# within past few months. SLP initially ordered as pt became acutely more dysarthric, hypophonic this admission. She was then intubated after an episode of emesis with concern for aspiration PNA. ETT 4/1-4/7. Significant PMH: Parkinson's disease (prior course of OP SLP in 2018 for dysarthria/voice, d/c after all goals were met),  anxiety ?  ?   ?SLP Plan ? Continue with current plan of care ? ?  ?  ?Recommendations for follow up therapy are one component of a multi-disciplinary discharge planning process, led by the attending physician.  Recommendations may be updated based on patient status, additional functional criteria and insurance authorization. ?  ? ?Recommendations  ?Diet recommendations: NPO (ice chips allowed) ?Medication Administration: Via alternative means  ?   ?    ?   ? ? ? ? Oral Care Recommendations: Oral care QID;Oral care prior to ice chip/H20 ?Follow Up Recommendations: Skilled nursing-short term rehab (<3 hours/day) ?Assistance recommended at discharge: Intermittent Supervision/Assistance ?SLP Visit Diagnosis: Dysphagia, unspecified (R13.10) ?Plan: Continue with current plan of care ? ? ? ? ?  ?  ? ? I. Hardin Negus, [place], CCC-SLP ?Acute Rehabilitation Services ?Office number [phone] ?Pager [phone] ? ?Horton Marshall ? ?02/06/2022, 11:57 AM ?

## 2022-02-06 NOTE — Progress Notes (Signed)
Consultation Progress Note ? ? ?Patient: [name] [name] CVE:[id number] DOB: 08-16-1951 DOA: 01/17/2022 ?DOS: the patient was seen and examined on 02/06/2022 ?Primary service: Edison Pace, Md, MD ? ?Brief hospital course: ?[name] [name] is a 71 y.o. female with past medical history of Parkinson's disease and anxiety who presented on 3/23 with abdominal pain, admitted for incarcerated left femoral hernia requiring open repair and resection of necrotic small bowel 3/23.  Hospital course complicated by postoperative ileus started on TPN.  ?ICU on 4/1 after patient reportedly had emesis the night prior due to worsening hypoxemia and respiratory distress.  Chest x-ray significant for concern for aspiration pneumonia for which patient ultimately required intubation.  Antibiotics have been widened to treat for aspiration in addition to the patient being placed on pressors 4/2.  Pressors were able to be weaned off 4/4.  CT scan of the abdomen from 4/5 noted EC fistula for which micafungin was started.   ? ?Assessment and Plan: ? ?Femoral hernia of left side with obstruction enterocutaneous fistula ?s/p left femoral hernia repair with mesh and small bowel resection 01/17/2022.   ?Postoperative fistula now resolved.  CT of the abdomen pelvis 4/5 concerning for enterocutaneous fistula.  Fistula output is decreased.  Repeat CT scan with improvement. ?-Continuing antibiotics of Zosyn ?-Management per surgery ?-Currently remains on TPN.  She can possibly be challenged with NG tube feeding today. ?-Electrolytes are stable.  Pain is managed. ?-Complains of right costochondral pain, CT scan with liver hemangioma, no concerns for abscess or trauma. ? ?Septic shock secondary to acute respiratory failure with hypoxia (HCC) secondary to aspiration pneumonia ?-Clinically resolved.  Repeat CT scan with improvement of infiltrates. ?-Continue Robitussin-DM ?-Flutter valve ?-CTA negative for pulmonary embolism. ?-DuoNebs as needed for shortness of  breath/wheezing ?-Continue Zosyn ?-Aggressive mobility. ? ?Parkinson disease ([place]) ?Patient reports increased rigidity at around 10 PM at night.  Current regimen of Sinemet IR 25- 250 mg 3 times daily with last dose at 1700. ?-Trial of increasing Sinemet to 4 times daily with last dose to be given at 10 PM.  Stable today. ? ?Failure to thrive in adult ?-Severely malnourished baseline clearly predates her acute illness. BMI is < 15.  ?-Palliative have been consulted, currently plan is for aggressive measures. ?-Continue TPN ?-Cortrak was placed, can possibly start on enteral feeding. ?-Speech therapy following. ? ?Anxiety ?Patient had been on buspar '5mg'$  BID and valium '2mg'$  (though this was prn vertigo) in the outpatient setting.   ?-Currently on Seroquel 25 mg nightly.  Increase dose to 50 mg nightly  ? ?Normocytic anemia ?Hemoglobin relatively stable . ?-continue to monitor ? ?Pressure injury of coccygeal region, stage 1 ?POA, offload and optimize nutritional status as much as possible.  ? ? ? ? ? ? ? ?TRH will continue to follow the patient along with you.  ? ?Subjective: Patient seen and examined.  She tells me her main concern is right lower chest wall pain.  Patient is alert awake.  She is able to explain me her symptoms.  Patient tells me she walked yesterday and people clapped at her. ?Tolerating TPN. ?She thinks she can swallow but she knows she has limitation because of Parkinson's. ?Denies any abdominal pain or flank pain.  Pain is mostly concentrated on the right lower chest wall. ?Patient thinks she can use higher doses of hydrocodone. ?Last BM 2 days ago as per patient. ?Minimal output at fistula. ? ?Physical Exam: ?Vitals:  ? 02/05/22 0756 02/05/22 1340 02/05/22 2116 02/06/22 0712  ?  BP: 98/66 97/72 (!) 100/58 120/76  ?Pulse: 99  86 (!) 109  ?Resp:   16 17  ?Temp: 97.8 ?F (36.6 ?C) 97.8 ?F (36.6 ?C) 98.3 ?F (36.8 ?C) 98.2 ?F (36.8 ?C)  ?TempSrc: Oral Axillary Oral Oral  ?SpO2: 98%  100% 100%  ?Weight:       ?Height:      ? ?General: Looks fairly comfortable frail debilitated.  Chronically sick looking lady.  On room air.  Cachectic. ?Cardiovascular: S1-S2 normal.  Regular rate rhythm. ?Respiratory: Bilateral clear.  No reproducible tenderness lower chest wall. ?Gastrointestinal: Soft.  Nontender.  No localized tenderness.  Bowel sound present.  Left lower quadrant patch looks clean and dry., ?Ext: No cyanosis or edema.  No swelling. ?Neuro: Alert and oriented.  Mostly awake, interactive. ?Musculoskeletal: No deformities. ? ? ?Data Reviewed: ? ?Reviewed labs and imaging as noted above. ? ?Family Communication: none  ? ?Time spent: 35 minutes  ? ?Author: ?Barb Merino, MD ?02/06/2022 7:50 AM ? ?For on call review [email or web address].  ?

## 2022-02-06 NOTE — Plan of Care (Signed)
  Problem: Clinical Measurements: Goal: Ability to maintain clinical measurements within normal limits will improve Outcome: Progressing Goal: Respiratory complications will improve Outcome: Progressing   

## 2022-02-06 NOTE — Consult Note (Addendum)
? ?  [facility] CM Inpatient Consult ? ? ?02/06/2022 ? ?[name] ?1950/12/11 ?[id number] ? ?[employer] [ACO] Patient: [employer] ? ?Primary Care Provider:  Vivi Barrack, MD, [place] [facility], is an embedded provider with a Chronic Care Management team and program, and is listed for the transition of care follow up and appointments. Patient was active with CCM LCSW and Embedded Pharmacist showing in the Care Team prior to admission noted. ? ?Patient was screened for long length of stay recent transfer from ICU.  Reviewed for Embedded practice service needs for chronic care management and disposition needs,  Patient is being recommended for a skilled nursing facility level of care verses LTACH at transition.  ? ?Plan:  Continue to follow for post hospital needs. With current disposition, patient TOC needs will be met at a skilled nursing facility level of care. ? ?Please contact for further questions, ? ?Natividad Brood, RN BSN CCM ?[facility] Liaison ? [phone] business mobile phone ?Toll free office [phone]  ?Fax number: [phone] ?[email or web address] ? ? ? ? ? ?

## 2022-02-07 ENCOUNTER — Inpatient Hospital Stay (HOSPITAL_COMMUNITY): Payer: Medicare HMO

## 2022-02-07 DIAGNOSIS — K4130 Unilateral femoral hernia, with obstruction, without gangrene, not specified as recurrent: Secondary | ICD-10-CM | POA: Diagnosis not present

## 2022-02-07 LAB — COMPREHENSIVE METABOLIC PANEL
ALT: 22 U/L (ref 0–44)
AST: 31 U/L (ref 15–41)
Albumin: 1.8 g/dL — ABNORMAL LOW (ref 3.5–5.0)
Alkaline Phosphatase: 56 U/L (ref 38–126)
Anion gap: 4 — ABNORMAL LOW (ref 5–15)
BUN: 15 mg/dL (ref 8–23)
CO2: 25 mmol/L (ref 22–32)
Calcium: 8.4 mg/dL — ABNORMAL LOW (ref 8.9–10.3)
Chloride: 108 mmol/L (ref 98–111)
Creatinine, Ser: 0.44 mg/dL (ref 0.44–1.00)
GFR, Estimated: >60 mL/min (ref >60)
Glucose, Bld: 109 mg/dL — ABNORMAL HIGH (ref 70–99)
Potassium: 4 mmol/L (ref 3.5–5.1)
Sodium: 137 mmol/L (ref 135–145)
Total Bilirubin: 0.3 mg/dL (ref 0.3–1.2)
Total Protein: 5.8 g/dL — ABNORMAL LOW (ref 6.5–8.1)

## 2022-02-07 LAB — GLUCOSE, CAPILLARY
Glucose-Capillary: 105 mg/dL — ABNORMAL HIGH (ref 70–99)
Glucose-Capillary: 123 mg/dL — ABNORMAL HIGH (ref 70–99)
Glucose-Capillary: 85 mg/dL (ref 70–99)

## 2022-02-07 LAB — MAGNESIUM: Magnesium: 1.9 mg/dL (ref 1.7–2.4)

## 2022-02-07 LAB — PHOSPHORUS: Phosphorus: 3.2 mg/dL (ref 2.5–4.6)

## 2022-02-07 MED ORDER — TRACE MINERALS CU-MN-SE-ZN 300-55-60-3000 MCG/ML IV SOLN
INTRAVENOUS | Status: AC
  Filled 2022-02-07: qty 611.07

## 2022-02-07 NOTE — Progress Notes (Signed)
PHARMACY - TOTAL PARENTERAL NUTRITION CONSULT NOTE ? ?Indication: Prolonged ileus with new EC fistula on 01/30/22 ? ?Patient Measurements: ?Height: '5\' 7"'$  (170.2 cm) ?Weight: 48.4 kg (106 lb 11.2 oz) ?IBW/kg (Calculated) : 61.6 ?TPN AdjBW (KG): 48.4 ?Body mass index is 16.71 kg/m?. ?Usual Weight: ~115 lbs ? ?Assessment:  ?12 YOF s/p femoral hernia repair with SBR 2/2 gangrene on 3/23.  Noted poor PO intake for about 6 months due to living situation. Daughter reports pt UBW ~115 lbs with wt loss of 20-30 lbs within past few months. Pharmacy consulted to manage TPN for ileus in pt with severe PCM upon admission. ? ?Glucose / Insulin: CBG up to 80s off TPN, 100-130s on TPN.  Not on SSI. ?Electrolytes: all WNL except slightly low Mag at 1.9 (goal >/= 2) ?Renal: SCr < 1, BUN WNL ?Hepatic: LFTs / tbili / TG WNL, albumin 1.8 ?Intake / Output; MIVF: UOP not charted, LBM 4/12. Net +13L  ? ?GI Imaging: ?3/23 Incarcerated L inguinal small-bowel hernia with high-grade SBO ?3/27 KUB - suggestive of postoperative ileus ?4/5 CT - possible EC fistula, hematoma vs seroma, anasarca, liver lesions ?4/12 CT: post-op obstruction vs ileus, large hepatic hemangioma, L inguinal region increased in size but could be post-op changes, multi fibroid uterus, negative for PE ?GI Surgeries / Procedures:  ?3/23 femoral hernia repair with SBR ? ?Central access: PICC placed 01/19/22 ?TPN start date: 01/19/22 ? ?Nutritional Goals: RD Estimated Needs ?Total Energy Estimated Needs: 1600 - 1800 ?Total Protein Estimated Needs: 80 - 95 grams ?Total Fluid Estimated Needs: >/= 1.6 L ? ?Current Nutrition:  ?NPO (did not tolerate trickle feeds 4/04 d/t abd distension) ?TPN ? ?Plan:  ?Continue cyclic TPN over 20 hrs (36-33m/hr, GIR < 5 mcg/kg/min) ?TPN providing 92g AA and 1670 kCal, meeting 100% of needs ?Electrolytes in TPN: Na 1571m/L, K 8037mL, Ca 2mE61m, increase Mag 13mE65m Phos 25mmo40m Cl:Ac 1:1 ?Add standard MVI and trace elements to TPN ?Continue  twice daily CBGs to monitor for hypoglycemia off TPN (CBGs at 1500/1700) ?If CBGs off TPN remain > 80 in next several days, could consider reducing TPN cycle ?Standard TPN labs on Mon and Thurs ? ?[name] [name] D. [name] [name], [name] [name], BCPS, BCCCP ?02/07/2022, 7:09 AM ? ?

## 2022-02-07 NOTE — Progress Notes (Addendum)
Modified Barium Swallow Progress Note ? ?Patient Details  ?Name: [name] [name] ?MRN: [id number] ?Date of Birth: 05-20-1951 ? ?Today's Date: 02/07/2022 ? ?Modified Barium Swallow completed.  Full report located under Chart Review in the Imaging Section. ? ?Brief recommendations include the following: ? ?Clinical Impression ? Pt exhibited mild oropharyngeal dysphagia with brief aspiraiton during study. Oral phase marked by lingual residue with thin and mechancical soft, piecemeal transit pattern and delayed transit. Her dentures/implants are ill fitting therefore not donned and pt used tongue and gums to mash the soft cereal bar texture with mild delays and left sided residual. Her hyolaryngeal excursion was within normal range and epiglottic deflection was complete although late which led to penetration with thin teaspoon (PAS 3) and brief trace aspiration with cup (PAS 6). Pt was verbally and visually educated to perform chin tuck which consistently resulted in no penetration or flash (PAS 2) throughout subsequent trials. Vallecular residue was intermittent and minimal with puree, thin and spontaneous swallow cleared. Esophagus was scanned and although not diagnosed during MBS, revealed residue in distal esophgus. Several trials there appeared to be slower transit through upper esophagus. At times she spontaneously kept larynx elevated after swallow Caryl Ada) perhaps to assist in upper esophageal clearance, Recommend pt initiate Dys 2 texture, thin liquids and chin tuck strategy with all liquids. Pills whole in puree and full assist initially to recall and implement strategy. Pt did well performing chin tuck during study. ST to follow up. ? ? ?Addendum: Notified by PA-C that pt's diet was changed to clear liquids due to GI issues ?  ? ? ?Swallow Evaluation Recommendations ? ?   ? ? SLP Diet Recommendations: Dysphagia 2 (Fine chop) solids;Thin liquid ? ? Liquid Administration via: Cup;No straw ? ? Medication  Administration: Whole meds with puree ? ? Supervision: Patient able to self feed;Full supervision/cueing for compensatory strategies (initial full supervision for strategies) ? ? Compensations: Slow rate;Small sips/bites;Clear throat intermittently;Chin tuck;Other (Comment) (chin tuck with liquids) ? ? Postural Changes: Seated upright at 90 degrees;Remain semi-upright after after feeds/meals (Comment) ? ? Oral Care Recommendations: Oral care BID ? ?   ? ? ? ?Houston Siren ?02/07/2022,11:42 AM ?

## 2022-02-07 NOTE — Progress Notes (Signed)
Consultation Progress Note ? ? ?Patient: [name] [name] YBW:[id number] DOB: 24-Aug-1951 DOA: 01/17/2022 ?DOS: the patient was seen and examined on 02/07/2022 ?Primary service: Edison Pace, Md, MD ? ?Brief hospital course: ?[name] [name] is a 71 y.o. female with past medical history of Parkinson's disease and anxiety who presented on 3/23 with abdominal pain, admitted for incarcerated left femoral hernia requiring open repair and resection of necrotic small bowel 3/23.  Hospital course complicated by postoperative ileus started on TPN.  ?ICU on 4/1 after patient reportedly had emesis the night prior due to worsening hypoxemia and respiratory distress.  Chest x-ray significant for concern for aspiration pneumonia for which patient ultimately required intubation.  Antibiotics have been widened to treat for aspiration in addition to the patient being placed on pressors 4/2.  Pressors were able to be weaned off 4/4.  CT scan of the abdomen from 4/5 noted EC fistula for which micafungin was started.   ? ?Assessment and Plan: ? ?Femoral hernia of left side with obstruction, intestinal gangrene and enterocutaneous fistula ?s/p left femoral hernia repair with mesh and small bowel resection 01/17/2022.   ?Enterocutaneous fistula with drain. ? Fistula output is decreased.  Repeat CT scan with improvement. ?-Continuing antibiotics of Zosyn, will treat with 21 days of therapy. ?-Management per surgery ?-Currently remains on TPN.  Expect long-term TPN for now. ?-Electrolytes are stable.  Pain is managed. ?-Complains of right costochondral pain, CT scan with liver hemangioma, no concerns for abscess or trauma.  Resolved today. ? ?Septic shock secondary to acute respiratory failure with hypoxia (HCC) secondary to aspiration pneumonia ?-Clinically resolved.  Repeat CT scan with improvement of infiltrates. ?-Continue Robitussin-DM ?-Flutter valve ?-CTA negative for pulmonary embolism. ?-DuoNebs as needed for shortness of  breath/wheezing ?-Continue Zosyn ?-Aggressive mobility. ?-Repeat a speech swallow evaluation today. ? ?Parkinson disease ([place]) ?Patient reports increased rigidity at around 10 PM at night.  Current regimen of Sinemet IR 25- 250 mg 3 times daily with last dose at 1700. ?-Trial of increasing Sinemet to 4 times daily with last dose to be given at 10 PM.  Stable today. ? ?Failure to thrive in adult ?-Severely malnourished baseline clearly predates her acute illness. BMI is < 15.  ?-Palliative have been consulted, currently plan is for aggressive measures. ?-Continue TPN ?-Cortrak was placed, can possibly start on enteral feeding and medications. ?-Speech therapy following.  Modified barium swallow today. ? ?Anxiety ?Patient had been on buspar '5mg'$  BID and valium '2mg'$  (though this was prn vertigo) in the outpatient setting.   ?-Currently on Seroquel 25 mg nightly.  Increase dose to 50 mg nightly with improvement of symptoms. ? ?Normocytic anemia ?Hemoglobin relatively stable . ?-continue to monitor ? ?Pressure injury of coccygeal region, stage 1 ?POA, offload and optimize nutritional status as much as possible.  ? ? ? ? ? ? ? ?TRH will continue to follow the patient along with you.  ? ?Subjective: Patient seen and examined.  No family at the bedside.  Pleasant.  Denies any right costochondral pain today.  Denies any nausea or vomiting. ?She will happy to have telemetry leads and NG tube come off. ? ?Physical Exam: ?Vitals:  ? 02/06/22 1916 02/07/22 0544 02/07/22 0759 02/07/22 0800  ?BP: (!) 89/63 110/78    ?Pulse: 87 100    ?Resp: '20 19  18  '$ ?Temp: 97.6 ?F (36.4 ?C) 97.8 ?F (36.6 ?C) 98.4 ?F (36.9 ?C)   ?TempSrc: Oral Oral Oral   ?SpO2: 99% 100% 100%   ?Weight:      ?  Height:      ? ?General: Looks fairly comfortable.  Frail debilitated and cachectic.  On room air. ?Cardiovascular: S1-S2 normal.  Regular rate rhythm. ?Respiratory: Bilateral clear.  No added sounds.  On room air today. ?Gastrointestinal: Soft.  Nontender.   Left groin Eakins patch with feculent drainage on the suction canister. ?Ext: No edema or swelling.  No cyanosis. ?Neuro: Alert and oriented.  Pleasant to conversation.  Generalized weakness but equal strength on all extremities. ?Musculoskeletal: No deformities. ? ? ? ? ?Data Reviewed: ? ?Reviewed labs and imaging as noted above. ? ?Family Communication: none  ? ?Time spent: 35 minutes  ? ?Author: ?Barb Merino, MD ?02/07/2022 10:58 AM ? ?For on call review [email or web address].  ?

## 2022-02-07 NOTE — Progress Notes (Signed)
Speech Language Pathology Treatment: Dysphagia  ?Patient Details ?Name: [name] [name] ?MRN: [id number] ?DOB: 1951-06-09 ?Today's Date: 02/07/2022 ?Time: [id number] ?SLP Time Calculation (min) (ACUTE ONLY): 18 min ? ?Assessment / Plan / Recommendation ?Clinical Impression ? Pt seen for dysphagia treatment with friend present. SLP explained goal/procedure of MBS and pt is agreeable for test today at 12:00. Rearranged in bed and oral care provided for dryness on lips. She consumed single ice chips independently with adequate oral manipulation and transit and no s/sx aspiration with limited po trial. MBS today at 12:00 due to increase risk factors of Parkinson's, 6 day intubation and pna.  ?  ?HPI HPI: 71 yo female with 1 day of worsening abdominal pain and vomiting, s/p femoral hernia repair with SBR 2/2 gangrene on 3/23. Noted poor po intake for about 6 months due to living situation. Daughter reports pt with wt loss of 20-30# within past few months. SLP initially ordered as pt became acutely more dysarthric, hypophonic this admission. She was then intubated after an episode of emesis with concern for aspiration PNA. ETT 4/1-4/7. Significant PMH: Parkinson's disease (prior course of OP SLP in 2018 for dysarthria/voice, d/c after all goals were met), anxiety ?  ?   ?SLP Plan ? MBS ? ?  ?  ?Recommendations for follow up therapy are one component of a multi-disciplinary discharge planning process, led by the attending physician.  Recommendations may be updated based on patient status, additional functional criteria and insurance authorization. ?  ? ?Recommendations  ?Diet recommendations: Other(comment) (ice chips) ?Liquids provided via: Teaspoon (ice chips) ?Medication Administration: Via alternative means  ?   ?    ?   ? ? ? ? Oral Care Recommendations: Oral care QID ?Follow Up Recommendations: Skilled nursing-short term rehab (<3 hours/day) ?Assistance recommended at discharge: Intermittent Supervision/Assistance ?SLP  Visit Diagnosis: Dysphagia, unspecified (R13.10) ?Plan: MBS ? ? ? ? ?  ?  ? ? ?Houston Siren ? ?02/07/2022, 9:18 AM ?

## 2022-02-07 NOTE — Progress Notes (Signed)
[facility] ?Progress Note ? ?21 Days Post-Op  ?Subjective: ?Minimal output from fistula - 200 ml in wall cannister this am. Having right chest wall pain still but thinks it is improving some with pain medications. Having bowel movements. No nausea or emesis. No other complaints. ? ?Objective: ?Vital signs in last 24 hours: ?Temp:  [97.6 ?F (36.4 ?C)-98.4 ?F (36.9 ?C)] 98.4 ?F (36.9 ?C) (04/13 0759) ?Pulse Rate:  [87-100] 100 (04/13 0544) ?Resp:  [18-20] 18 (04/13 0800) ?BP: (88-110)/(60-78) 110/78 (04/13 0544) ?SpO2:  [98 %-100 %] 100 % (04/13 0759) ?Last BM Date : 02/05/22 ? ?Intake/Output from previous day: ?04/12 0701 - 04/13 0700 ?In: 244.2 [IV Piggyback:244.2] ?Out: -  ?Intake/Output this shift: ?No intake/output data recorded. ? ?PE: ?Gen:  Alert, NAD ?Resp: normal work of breathing, point tenderness to palpation along the right inferior chest wall, lidocaine patch in place, no palpable abnormalities. ?Abd: coretrak in place. nondistended, soft, nontender, pouch over LLQ with thin bilious fluid in bag, 200 ml in wall cannister ? ?Lab Results:  ?Recent Labs  ?  02/05/22 ?0450 02/06/22 ?0320  ?WBC 15.3* 13.4*  ?HGB 8.8* 8.2*  ?HCT 27.1* 25.9*  ?PLT 416* 398  ? ? ?BMET ?Recent Labs  ?  02/06/22 ?0320 02/07/22 ?0354  ?NA 133* 137  ?K 4.1 4.0  ?CL 105 108  ?CO2 22 25  ?GLUCOSE 100* 109*  ?BUN 18 15  ?CREATININE 0.44 0.44  ?CALCIUM 8.0* 8.4*  ? ? ?PT/INR ?No results for input(s): LABPROT, INR in the last 72 hours. ?CMP  ?   ?Component Value Date/Time  ? NA 137 02/07/2022 0354  ? K 4.0 02/07/2022 0354  ? CL 108 02/07/2022 0354  ? CO2 25 02/07/2022 0354  ? GLUCOSE 109 (H) 02/07/2022 0354  ? BUN 15 02/07/2022 0354  ? CREATININE 0.44 02/07/2022 0354  ? CREATININE 0.79 06/03/2018 1103  ? CALCIUM 8.4 (L) 02/07/2022 0354  ? PROT 5.8 (L) 02/07/2022 0354  ? ALBUMIN 1.8 (L) 02/07/2022 0354  ? AST 31 02/07/2022 0354  ? AST 17 06/03/2018 1103  ? ALT 22 02/07/2022 0354  ? ALT 11 06/03/2018 1103  ? ALKPHOS 56  02/07/2022 0354  ? BILITOT 0.3 02/07/2022 0354  ? BILITOT 0.5 06/03/2018 1103  ? GFRNONAA >60 02/07/2022 0354  ? GFRNONAA >60 06/03/2018 1103  ? GFRAA >60 01/31/2020 2048  ? GFRAA >60 06/03/2018 1103  ? ?Lipase  ?   ?Component Value Date/Time  ? LIPASE <10 (L) 01/17/2022 1416  ? ? ? ? ? ?Studies/Results: ?CT Angio Chest Pulmonary Embolism (PE) W or WO Contrast ? ?Result Date: 02/06/2022 ?CLINICAL DATA:  Elevated D-dimer and abdominal pain. EXAM: CT ANGIOGRAPHY CHEST WITH CONTRAST TECHNIQUE: Multidetector CT imaging of the chest was performed using the standard protocol during bolus administration of intravenous contrast. Multiplanar CT image reconstructions and MIPs were obtained to evaluate the vascular anatomy. RADIATION DOSE REDUCTION: This exam was performed according to the departmental dose-optimization program which includes automated exposure control, adjustment of the mA and/or kV according to patient size and/or use of iterative reconstruction technique. CONTRAST:  131m OMNIPAQUE IOHEXOL 350 MG/ML SOLN COMPARISON:  January 27, 2022 FINDINGS: Cardiovascular: Satisfactory opacification of the pulmonary arteries to the segmental level. No evidence of pulmonary embolism. Normal heart size. No pericardial effusion. Mediastinum/Nodes: A nasogastric tube is in place and properly positioned. No enlarged mediastinal, hilar, or axillary lymph nodes. Thyroid gland, trachea, and esophagus demonstrate no significant findings. Lungs/Pleura: A large area of dense airspace consolidation is  again seen within the posteromedial aspect of the right upper lobe, right middle lobe and superior segment of the right lower lobe. This is mildly decreased in size when compared to the prior study. Mild to moderate severity left basilar atelectasis is noted. This is decreased in severity when compared to the prior exam. There is a small to moderate size right pleural effusion which is increased in size when compared to the prior study. No  pneumothorax is identified. Upper Abdomen: No acute abnormality. Musculoskeletal: No chest wall abnormality. No acute or significant osseous findings. Review of the MIP images confirms the above findings. IMPRESSION: 1. No evidence of acute pulmonary embolism. 2. Marked severity right-sided multifocal infiltrate, mildly decreased in size when compared to the prior study. 3. Mild to moderate severity left basilar atelectasis, decreased in severity when compared to the prior study. 4. Small to moderate size right pleural effusion, increased in size when compared to the prior exam. Electronically Signed   By: Virgina Norfolk M.D.   On: 02/06/2022 00:33  ? ?CT ABDOMEN PELVIS W CONTRAST ? ?Result Date: 02/06/2022 ?CLINICAL DATA:  Abdominal pain. EXAM: CT ABDOMEN AND PELVIS WITH CONTRAST TECHNIQUE: Multidetector CT imaging of the abdomen and pelvis was performed using the standard protocol following bolus administration of intravenous contrast. RADIATION DOSE REDUCTION: This exam was performed according to the departmental dose-optimization program which includes automated exposure control, adjustment of the mA and/or kV according to patient size and/or use of iterative reconstruction technique. CONTRAST:  13m OMNIPAQUE IOHEXOL 350 MG/ML SOLN COMPARISON:  January 30, 2022 FINDINGS: Lower chest: Moderate to marked severity right lower lobe infiltrate is seen. Mild to moderate severity posterior left basilar atelectasis is noted. There is a small to moderate sized right pleural effusion. Hepatobiliary: A 4.2 cm x 1.1 cm x 4.9 cm lobulated area of parenchymal low attenuation, and surrounding nodular enhancement, is seen within the lateral aspect of the right lobe of the liver. Pancreas: Unremarkable. No pancreatic ductal dilatation or surrounding inflammatory changes. Spleen: Normal in size without focal abnormality. Adrenals/Urinary Tract: Adrenal glands are unremarkable. Kidneys are normal in size, without renal calculi or  hydronephrosis. A 3.8 cm diameter simple cyst is seen within the anterior aspect of the mid left kidney. No additional follow-up imaging is recommended. The urinary bladder is markedly distended and otherwise unremarkable. Stomach/Bowel: A nasogastric tube is in place. Stomach is within normal limits. Surgically anastomosed bowel is seen within the anterior aspect of the pelvis, along the midline. A short segment of partially decompressed, thickened small bowel is seen (axial CT images 56 through 66, CT series 6). Multiple dilated, fluid-filled loops of small bowel are seen within the mid and lower left abdomen (maximum small bowel diameter of approximately 3.5 cm). A clear transition zone is not identified. Vascular/Lymphatic: No significant vascular findings are present. No enlarged abdominal or pelvic lymph nodes. Reproductive: An enlarged multi fibroid uterus is seen. The bilateral adnexa are unremarkable. Other: A 4.2 cm x 4.3 cm complex area of fluid and soft tissue attenuation is seen within the left inguinal region. This is increased in size when compared to the prior exam. No abdominopelvic ascites. Musculoskeletal: Degenerative changes seen within the lumbar spine at the level of L5-S1. IMPRESSION: 1. Postoperative changes, as described above with additional findings consistent with a postoperative obstruction versus postoperative ileus. 2. Small to moderate sized right pleural effusion. 3. Right lower lobe infiltrate with posterior left basilar atelectasis. 4. Findings likely consistent with a large hepatic hemangioma. Correlation  with non emergent hepatic ultrasound is recommended. 5. Complex appearing area within the left inguinal region, which is increased in size when compared to the prior study and may represent postoperative changes. 6. Enlarged multi fibroid uterus. Electronically Signed   By: Virgina Norfolk M.D.   On: 02/06/2022 00:44   ? ?Anti-infectives: ?Anti-infectives (From admission,  onward)  ? ? Start     Dose/Rate Route Frequency Ordered Stop  ? 01/30/22 1000  micafungin (MYCAMINE) 100 mg in sodium chloride 0.9 % 100 mL IVPB  Status:  Discontinued       ? 100 mg ?110 mL/hr over 1 Hours

## 2022-02-07 NOTE — Progress Notes (Signed)
[facility] ?Progress Note ? ?21 Days Post-Op  ?Subjective: ?CT PE yesterday negative for PE, shows improving right pulmonary infiltrates. No fluid collections on abdominal CT, although there are inflammatory changes in left groin at site of known fistula. Patient still has right rib pain but no new complaints. Fistula output remains minimal. ? ?Objective: ?Vital signs in last 24 hours: ?Temp:  [97.6 ?F (36.4 ?C)-98.4 ?F (36.9 ?C)] 98.4 ?F (36.9 ?C) (04/13 0759) ?Pulse Rate:  [87-100] 100 (04/13 0544) ?Resp:  [18-20] 18 (04/13 0800) ?BP: (88-110)/(60-78) 110/78 (04/13 0544) ?SpO2:  [98 %-100 %] 100 % (04/13 0759) ?Last BM Date : 02/05/22 ? ?Intake/Output from previous day: ?04/12 0701 - 04/13 0700 ?In: 244.2 [IV Piggyback:244.2] ?Out: -  ?Intake/Output this shift: ?Total I/O ?In: -  ?Out: 500 [Urine:500] ? ?PE: ?Gen:  Alert, NAD ?Resp: normal work of breathing, point tenderness to palpation along the right inferior chest wall, no palpable abnormalities. ?Abd: mildly distended but soft, nontender, pouch over LLQ with thin bilious fluid in bag. ? ?Lab Results:  ?Recent Labs  ?  02/05/22 ?0450 02/06/22 ?0320  ?WBC 15.3* 13.4*  ?HGB 8.8* 8.2*  ?HCT 27.1* 25.9*  ?PLT 416* 398  ? ?BMET ?Recent Labs  ?  02/06/22 ?0320 02/07/22 ?0354  ?NA 133* 137  ?K 4.1 4.0  ?CL 105 108  ?CO2 22 25  ?GLUCOSE 100* 109*  ?BUN 18 15  ?CREATININE 0.44 0.44  ?CALCIUM 8.0* 8.4*  ? ?PT/INR ?No results for input(s): LABPROT, INR in the last 72 hours. ?CMP  ?   ?Component Value Date/Time  ? NA 137 02/07/2022 0354  ? K 4.0 02/07/2022 0354  ? CL 108 02/07/2022 0354  ? CO2 25 02/07/2022 0354  ? GLUCOSE 109 (H) 02/07/2022 0354  ? BUN 15 02/07/2022 0354  ? CREATININE 0.44 02/07/2022 0354  ? CREATININE 0.79 06/03/2018 1103  ? CALCIUM 8.4 (L) 02/07/2022 0354  ? PROT 5.8 (L) 02/07/2022 0354  ? ALBUMIN 1.8 (L) 02/07/2022 0354  ? AST 31 02/07/2022 0354  ? AST 17 06/03/2018 1103  ? ALT 22 02/07/2022 0354  ? ALT 11 06/03/2018 1103  ? ALKPHOS 56  02/07/2022 0354  ? BILITOT 0.3 02/07/2022 0354  ? BILITOT 0.5 06/03/2018 1103  ? GFRNONAA >60 02/07/2022 0354  ? GFRNONAA >60 06/03/2018 1103  ? GFRAA >60 01/31/2020 2048  ? GFRAA >60 06/03/2018 1103  ? ?Lipase  ?   ?Component Value Date/Time  ? LIPASE <10 (L) 01/17/2022 1416  ? ? ? ? ? ?Studies/Results: ?CT Angio Chest Pulmonary Embolism (PE) W or WO Contrast ? ?Result Date: 02/06/2022 ?CLINICAL DATA:  Elevated D-dimer and abdominal pain. EXAM: CT ANGIOGRAPHY CHEST WITH CONTRAST TECHNIQUE: Multidetector CT imaging of the chest was performed using the standard protocol during bolus administration of intravenous contrast. Multiplanar CT image reconstructions and MIPs were obtained to evaluate the vascular anatomy. RADIATION DOSE REDUCTION: This exam was performed according to the departmental dose-optimization program which includes automated exposure control, adjustment of the mA and/or kV according to patient size and/or use of iterative reconstruction technique. CONTRAST:  190m OMNIPAQUE IOHEXOL 350 MG/ML SOLN COMPARISON:  January 27, 2022 FINDINGS: Cardiovascular: Satisfactory opacification of the pulmonary arteries to the segmental level. No evidence of pulmonary embolism. Normal heart size. No pericardial effusion. Mediastinum/Nodes: A nasogastric tube is in place and properly positioned. No enlarged mediastinal, hilar, or axillary lymph nodes. Thyroid gland, trachea, and esophagus demonstrate no significant findings. Lungs/Pleura: A large area of dense airspace consolidation is again  seen within the posteromedial aspect of the right upper lobe, right middle lobe and superior segment of the right lower lobe. This is mildly decreased in size when compared to the prior study. Mild to moderate severity left basilar atelectasis is noted. This is decreased in severity when compared to the prior exam. There is a small to moderate size right pleural effusion which is increased in size when compared to the prior study. No  pneumothorax is identified. Upper Abdomen: No acute abnormality. Musculoskeletal: No chest wall abnormality. No acute or significant osseous findings. Review of the MIP images confirms the above findings. IMPRESSION: 1. No evidence of acute pulmonary embolism. 2. Marked severity right-sided multifocal infiltrate, mildly decreased in size when compared to the prior study. 3. Mild to moderate severity left basilar atelectasis, decreased in severity when compared to the prior study. 4. Small to moderate size right pleural effusion, increased in size when compared to the prior exam. Electronically Signed   By: Virgina Norfolk M.D.   On: 02/06/2022 00:33  ? ?CT ABDOMEN PELVIS W CONTRAST ? ?Result Date: 02/06/2022 ?CLINICAL DATA:  Abdominal pain. EXAM: CT ABDOMEN AND PELVIS WITH CONTRAST TECHNIQUE: Multidetector CT imaging of the abdomen and pelvis was performed using the standard protocol following bolus administration of intravenous contrast. RADIATION DOSE REDUCTION: This exam was performed according to the departmental dose-optimization program which includes automated exposure control, adjustment of the mA and/or kV according to patient size and/or use of iterative reconstruction technique. CONTRAST:  115m OMNIPAQUE IOHEXOL 350 MG/ML SOLN COMPARISON:  January 30, 2022 FINDINGS: Lower chest: Moderate to marked severity right lower lobe infiltrate is seen. Mild to moderate severity posterior left basilar atelectasis is noted. There is a small to moderate sized right pleural effusion. Hepatobiliary: A 4.2 cm x 1.1 cm x 4.9 cm lobulated area of parenchymal low attenuation, and surrounding nodular enhancement, is seen within the lateral aspect of the right lobe of the liver. Pancreas: Unremarkable. No pancreatic ductal dilatation or surrounding inflammatory changes. Spleen: Normal in size without focal abnormality. Adrenals/Urinary Tract: Adrenal glands are unremarkable. Kidneys are normal in size, without renal calculi or  hydronephrosis. A 3.8 cm diameter simple cyst is seen within the anterior aspect of the mid left kidney. No additional follow-up imaging is recommended. The urinary bladder is markedly distended and otherwise unremarkable. Stomach/Bowel: A nasogastric tube is in place. Stomach is within normal limits. Surgically anastomosed bowel is seen within the anterior aspect of the pelvis, along the midline. A short segment of partially decompressed, thickened small bowel is seen (axial CT images 56 through 66, CT series 6). Multiple dilated, fluid-filled loops of small bowel are seen within the mid and lower left abdomen (maximum small bowel diameter of approximately 3.5 cm). A clear transition zone is not identified. Vascular/Lymphatic: No significant vascular findings are present. No enlarged abdominal or pelvic lymph nodes. Reproductive: An enlarged multi fibroid uterus is seen. The bilateral adnexa are unremarkable. Other: A 4.2 cm x 4.3 cm complex area of fluid and soft tissue attenuation is seen within the left inguinal region. This is increased in size when compared to the prior exam. No abdominopelvic ascites. Musculoskeletal: Degenerative changes seen within the lumbar spine at the level of L5-S1. IMPRESSION: 1. Postoperative changes, as described above with additional findings consistent with a postoperative obstruction versus postoperative ileus. 2. Small to moderate sized right pleural effusion. 3. Right lower lobe infiltrate with posterior left basilar atelectasis. 4. Findings likely consistent with a large hepatic hemangioma. Correlation with  non emergent hepatic ultrasound is recommended. 5. Complex appearing area within the left inguinal region, which is increased in size when compared to the prior study and may represent postoperative changes. 6. Enlarged multi fibroid uterus. Electronically Signed   By: Virgina Norfolk M.D.   On: 02/06/2022 00:44   ? ?Anti-infectives: ?Anti-infectives (From admission,  onward)  ? ? Start     Dose/Rate Route Frequency Ordered Stop  ? 01/30/22 1000  micafungin (MYCAMINE) 100 mg in sodium chloride 0.9 % 100 mL IVPB  Status:  Discontinued       ? 100 mg ?110 mL/hr over 1 H

## 2022-02-07 NOTE — TOC Progression Note (Signed)
Transition of Care (TOC) - Progression Note  ? ? ?Patient Details  ?Name: YMANI PORCHER ?MRN: [id number] ?Date of Birth: Jun 03, 1951 ? ?Transition of Care (TOC) CM/SW Contact  ?Joanne Chars, LCSW ?Phone Number: ?02/07/2022, 3:06 PM ? ?Clinical Narrative:    ?CSW spoke with pt daughter Melvinie who confirms that they would like to choose [facility]. ? ? ?Expected Discharge Plan: [facility] ([facility]) ?Barriers to Discharge: Continued Medical Work up, SNF Pending bed offer ? ?Expected Discharge Plan and Services ?Expected Discharge Plan: [facility] ([facility]) ?In-house Referral: Clinical Social Work ?  ?Post Acute Care Choice: [facility] ?Living arrangements for the past 2 months: [place] ?                ?  ?  ?  ?  ?  ?  ?  ?  ?  ?  ? ? ?Social Determinants of Health (SDOH) Interventions ?  ? ?Readmission Risk Interventions ?   ? View : No data to display.  ?  ?  ?  ? ? ?

## 2022-02-07 NOTE — Progress Notes (Signed)
No output from eakins pouch this shift 7a-7p ?

## 2022-02-08 DIAGNOSIS — K4130 Unilateral femoral hernia, with obstruction, without gangrene, not specified as recurrent: Secondary | ICD-10-CM | POA: Diagnosis not present

## 2022-02-08 LAB — GLUCOSE, CAPILLARY
Glucose-Capillary: 119 mg/dL — ABNORMAL HIGH (ref 70–99)
Glucose-Capillary: 83 mg/dL (ref 70–99)
Glucose-Capillary: 84 mg/dL (ref 70–99)

## 2022-02-08 MED ORDER — BOOST / RESOURCE BREEZE PO LIQD CUSTOM
1.0000 | Freq: Three times a day (TID) | ORAL | Status: DC
  Administered 2022-02-08: 1 via ORAL

## 2022-02-08 MED ORDER — GUAIFENESIN-DM 100-10 MG/5ML PO SYRP
15.0000 mL | ORAL_SOLUTION | Freq: Two times a day (BID) | ORAL | Status: DC
  Administered 2022-02-08 – 2022-02-14 (×12): 15 mL via ORAL
  Filled 2022-02-08 (×13): qty 20

## 2022-02-08 MED ORDER — OXYCODONE HCL 5 MG PO TABS
5.0000 mg | ORAL_TABLET | ORAL | Status: DC | PRN
  Administered 2022-02-09 – 2022-02-11 (×4): 5 mg via ORAL
  Filled 2022-02-08 (×6): qty 1

## 2022-02-08 MED ORDER — SIMETHICONE 80 MG PO CHEW: 80.0000 mg | CHEWABLE_TABLET | Freq: Four times a day (QID) | ORAL | Status: DC | PRN

## 2022-02-08 MED ORDER — ASCORBIC ACID 500 MG PO TABS
500.0000 mg | ORAL_TABLET | Freq: Every day | ORAL | Status: DC
  Administered 2022-02-09 – 2022-02-14 (×6): 500 mg via ORAL
  Filled 2022-02-08 (×6): qty 1

## 2022-02-08 MED ORDER — ACETAMINOPHEN 160 MG/5ML PO SOLN
650.0000 mg | Freq: Four times a day (QID) | ORAL | Status: DC
  Administered 2022-02-08 – 2022-02-14 (×23): 650 mg via ORAL
  Filled 2022-02-08 (×24): qty 20.3

## 2022-02-08 MED ORDER — TRACE MINERALS CU-MN-SE-ZN 300-55-60-3000 MCG/ML IV SOLN
INTRAVENOUS | Status: AC
  Filled 2022-02-08: qty 611.07

## 2022-02-08 MED ORDER — ENSURE ENLIVE PO LIQD
237.0000 mL | Freq: Three times a day (TID) | ORAL | Status: DC
  Administered 2022-02-08 – 2022-02-14 (×16): 237 mL via ORAL

## 2022-02-08 MED ORDER — METHOCARBAMOL 500 MG PO TABS
500.0000 mg | ORAL_TABLET | Freq: Four times a day (QID) | ORAL | Status: DC | PRN
  Administered 2022-02-11: 500 mg via ORAL
  Filled 2022-02-08: qty 1

## 2022-02-08 MED ORDER — PANTOPRAZOLE SODIUM 40 MG PO TBEC
40.0000 mg | DELAYED_RELEASE_TABLET | Freq: Every day | ORAL | Status: DC
Start: 2022-02-08 — End: 2022-02-14
  Administered 2022-02-08 – 2022-02-14 (×7): 40 mg via ORAL
  Filled 2022-02-08 (×7): qty 1

## 2022-02-08 MED ORDER — DOCUSATE SODIUM 50 MG/5ML PO LIQD: 100.0000 mg | Freq: Two times a day (BID) | ORAL | Status: DC | PRN

## 2022-02-08 MED ORDER — MELATONIN 3 MG PO TABS
3.0000 mg | ORAL_TABLET | Freq: Every day | ORAL | Status: DC
Start: 2022-02-08 — End: 2022-02-14
  Administered 2022-02-08 – 2022-02-13 (×6): 3 mg via ORAL
  Filled 2022-02-08 (×6): qty 1

## 2022-02-08 MED ORDER — BUSPIRONE HCL 5 MG PO TABS
5.0000 mg | ORAL_TABLET | Freq: Two times a day (BID) | ORAL | Status: DC
Start: 2022-02-08 — End: 2022-02-09
  Administered 2022-02-08 (×2): 5 mg via ORAL
  Filled 2022-02-08 (×3): qty 1

## 2022-02-08 MED ORDER — CARBIDOPA-LEVODOPA 25-250 MG PO TABS
1.0000 | ORAL_TABLET | Freq: Four times a day (QID) | ORAL | Status: DC
  Administered 2022-02-08 – 2022-02-14 (×25): 1 via ORAL
  Filled 2022-02-08 (×28): qty 1

## 2022-02-08 NOTE — Progress Notes (Signed)
Consultation Progress Note ? ? ?Patient: [name] [name] ZOX:[id number] DOB: 1951/08/07 DOA: 01/17/2022 ?DOS: the patient was seen and examined on 02/08/2022 ?Primary service: Edison Pace, Md, MD ? ?Brief hospital course: ?[name] [name] is a 71 y.o. female with past medical history of Parkinson's disease and anxiety who presented on 3/23 with abdominal pain, admitted for incarcerated left femoral hernia requiring open repair and resection of necrotic small bowel 3/23.  Hospital course complicated by postoperative ileus started on TPN.  ?ICU on 4/1 after patient reportedly had emesis the night prior due to worsening hypoxemia and respiratory distress.  Chest x-ray significant for concern for aspiration pneumonia for which patient ultimately required intubation.  Antibiotics have been widened to treat for aspiration in addition to the patient being placed on pressors 4/2.  Pressors were able to be weaned off 4/4.  CT scan of the abdomen from 4/5 noted EC fistula . ? ?Assessment and Plan: ? ?Femoral hernia of left side with obstruction, intestinal gangrene and enterocutaneous fistula ?Tolerating clears, change medications to by mouth.  NG tube can probably come out. ?Anticipate long-term TPN.  Refer to LTAC. ?Completed antibiotic therapy. ?Rest of the management as per surgery. ? ?Septic shock secondary to acute respiratory failure with hypoxia (HCC) secondary to aspiration pneumonia ?Clinically improved.  Completed antibiotic therapy.  Aspiration precautions.  Respiratory therapy and chest.  Therapy. ? ?Parkinson disease ([place]) ?Patient reports increased rigidity at around 10 PM at night.  Current regimen of Sinemet IR 25- 250 mg 3 times daily with last dose at 1700. ?-Trial of increasing Sinemet to 4 times daily with last dose to be given at 10 PM.  Symptoms improved. ? ?Failure to thrive in adult ?-Severely malnourished baseline clearly predates her acute illness. BMI is < 15.  ?-Palliative have been consulted, currently plan  is for aggressive measures. ?-Continue TPN ? ? ?Anxiety ?Patient had been on buspar '5mg'$  BID and valium '2mg'$  (though this was prn vertigo) in the outpatient setting.   ?-Given Seroquel at night.  Will resume home doses of BuSpar. ? ?Normocytic anemia ?Hemoglobin relatively stable . ?-continue to monitor ? ?Pressure injury of coccygeal region, stage 1 ?POA, offload and optimize nutritional status as much as possible.  ? ? ? ? ? ? ? ?TRH will continue to follow the patient along with you.  ? ?Subjective: Patient seen and examined.  Denies any pain.  Denies any nausea vomiting.  NG tube is still in place. ?Her caretaker at the bedside. ?Patient had more than 10 episodes of loose watery stool overnight.  Minimal drain on the suction canister. ? ?Physical Exam: ?Vitals:  ? 02/07/22 1357 02/07/22 1941 02/08/22 0741 02/08/22 1043  ?BP: 106/70 106/66 111/73   ?Pulse: 98 99 (!) 105   ?Resp: '17 18 17   '$ ?Temp: 98 ?F (36.7 ?C) 98 ?F (36.7 ?C) 98.1 ?F (36.7 ?C)   ?TempSrc: Oral Oral Oral   ?SpO2: 100% 100% 100%   ?Weight:    11.9 kg  ?Height:      ? ?General: Looks fairly comfortable.  Frail debilitated and cachectic.  On room air. ?Cardiovascular: S1-S2 normal.  Regular rate rhythm. ?Respiratory: Bilateral clear.  No added sounds.   ?Gastrointestinal: Soft.  Nontender.  Left groin Eakins patch with feculent drainage on the suction canister. ?Ext: No edema or swelling.  No cyanosis. ?Neuro: Alert and oriented.  Pleasant to conversation.  Generalized weakness but equal strength on all extremities. ?Musculoskeletal: No deformities. ? ? ? ? ?Data Reviewed: ? ?  Reviewed labs and imaging as noted above. ? ?Family Communication: none  ? ?Time spent: 35 minutes  ? ?Author: ?Barb Merino, MD ?02/08/2022 10:47 AM ? ?For on call review [email or web address].  ?

## 2022-02-08 NOTE — Plan of Care (Signed)
  Problem: Education: Goal: Knowledge of General Education information will improve Description: Including pain rating scale, medication(s)/side effects and non-pharmacologic comfort measures Outcome: Progressing   Problem: Clinical Measurements: Goal: Ability to maintain clinical measurements within normal limits will improve Outcome: Progressing Goal: Will remain free from infection Outcome: Progressing   

## 2022-02-08 NOTE — Progress Notes (Signed)
[facility] ?Progress Note ? ?22 Days Post-Op  ?Subjective: ?Very scant output from fistula overnight. No acute changes. Patient has no acute complaints this morning. ? ?Objective: ?Vital signs in last 24 hours: ?Temp:  [97.8 ?F (36.6 ?C)-98.1 ?F (36.7 ?C)] 97.8 ?F (36.6 ?C) (04/14 1353) ?Pulse Rate:  [99-105] 100 (04/14 1353) ?Resp:  [17-18] 18 (04/14 1353) ?BP: (102-111)/(64-73) 102/64 (04/14 1353) ?SpO2:  [100 %] 100 % (04/14 1353) ?Weight:  [11.9 kg] 11.9 kg (04/14 1043) ?Last BM Date : 02/07/22 ? ?Intake/Output from previous day: ?04/13 0701 - 04/14 0700 ?In: 0  ?Out: 500 [Urine:500] ?Intake/Output this shift: ?No intake/output data recorded. ? ?PE: ?Gen:  Alert, NAD ?Resp: normal work of breathing ?Abd: coretrak in place. nondistended, soft, nontender, pouch over LLQ with scant purulent fluid in bag ? ?Lab Results:  ?Recent Labs  ?  02/06/22 ?0320  ?WBC 13.4*  ?HGB 8.2*  ?HCT 25.9*  ?PLT 398  ? ?BMET ?Recent Labs  ?  02/06/22 ?0320 02/07/22 ?0354  ?NA 133* 137  ?K 4.1 4.0  ?CL 105 108  ?CO2 22 25  ?GLUCOSE 100* 109*  ?BUN 18 15  ?CREATININE 0.44 0.44  ?CALCIUM 8.0* 8.4*  ? ?PT/INR ?No results for input(s): LABPROT, INR in the last 72 hours. ?CMP  ?   ?Component Value Date/Time  ? NA 137 02/07/2022 0354  ? K 4.0 02/07/2022 0354  ? CL 108 02/07/2022 0354  ? CO2 25 02/07/2022 0354  ? GLUCOSE 109 (H) 02/07/2022 0354  ? BUN 15 02/07/2022 0354  ? CREATININE 0.44 02/07/2022 0354  ? CREATININE 0.79 06/03/2018 1103  ? CALCIUM 8.4 (L) 02/07/2022 0354  ? PROT 5.8 (L) 02/07/2022 0354  ? ALBUMIN 1.8 (L) 02/07/2022 0354  ? AST 31 02/07/2022 0354  ? AST 17 06/03/2018 1103  ? ALT 22 02/07/2022 0354  ? ALT 11 06/03/2018 1103  ? ALKPHOS 56 02/07/2022 0354  ? BILITOT 0.3 02/07/2022 0354  ? BILITOT 0.5 06/03/2018 1103  ? GFRNONAA >60 02/07/2022 0354  ? GFRNONAA >60 06/03/2018 1103  ? GFRAA >60 01/31/2020 2048  ? GFRAA >60 06/03/2018 1103  ? ?Lipase  ?   ?Component Value Date/Time  ? LIPASE <10 (L) 01/17/2022 1416   ? ? ? ? ? ?Studies/Results: ?DG Swallowing Func-Speech Pathology ? ?Result Date: 02/07/2022 ?Table formatting from the original result was not included. Objective Swallowing Evaluation: Type of Study: MBS-Modified Barium Swallow Study  Patient Details Name: [name] [name] MRN: [id number] Date of Birth: 1951-01-06 Today's Date: 02/07/2022 Time: SLP Start Time (ACUTE ONLY): 1004 -SLP Stop Time (ACUTE ONLY): 1022 SLP Time Calculation (min) (ACUTE ONLY): 18 min Past Medical History: Past Medical History: Diagnosis Date  Fibroid   GERD (gastroesophageal reflux disease)   Muscle weakness of lower extremity 01/17/2015  Osteopenia   Parkinson disease ([place]) 01/17/2015 Past Surgical History: Past Surgical History: Procedure Laterality Date  FINGER SURGERY    INGUINAL HERNIA REPAIR Left 01/17/2022  Procedure: Left femoral hernia repair with mesh and small bowel resection and anastomosis;  Surgeon: Kinsinger, Arta Bruce, MD;  Location: [facility];  Service: General;  Laterality: Left;  MOUTH SURGERY    TOE SURGERY    TONSILLECTOMY    TUBAL LIGATION    x2 HPI: 71 yo female with 1 day of worsening abdominal pain and vomiting, s/p femoral hernia repair with SBR 2/2 gangrene on 3/23. Noted poor po intake for about 6 months due to living situation. Daughter reports pt with wt loss of 20-30# within  past few months. SLP initially ordered as pt became acutely more dysarthric, hypophonic this admission. She was then intubated after an episode of emesis with concern for aspiration PNA. ETT 4/1-4/7. Significant PMH: Parkinson's disease (prior course of OP SLP in 2018 for dysarthria/voice, d/c after all goals were met), anxiety  Subjective: pt feels overwhelmed  Recommendations for follow up therapy are one component of a multi-disciplinary discharge planning process, led by the attending physician.  Recommendations may be updated based on patient status, additional functional criteria and insurance authorization. Assessment / Plan / Recommendation    02/07/2022  11:15 AM Clinical Impressions Clinical Impression Pt exhibited mild oropharyngeal dysphagia with brief aspiraiton during study. Oral phase marked by lingual residue with thin and mechancical soft, piecemeal transit pattern and delayed transit. Her dentures/implants are ill fitting therefore not donned and pt used tongue and gums to mash the soft cereal bar texture with mild delays and left sided residual. Her hyolaryngeal excursion was within normal range and epiglottic deflection was complete although late which led to penetration with thin teaspoon (PAS 3) and brief trace aspiration with cup (PAS 6). Pt was verbally and visually educated to perform chin tuck which consistently resulted in no penetration or flash (PAS 2) throughout subsequent trials. Vallecular residue was intermittent and minimal with puree, thin and spontaneous swallow cleared. Esophagus was scanned and although not diagnosed during MBS, revealed residue in distal esophgus. Several trials there appeared to be slower transit through upper esophagus. At times she spontaneously kept larynx elevated after swallow Caryl Ada) perhaps to assist in upper esophageal clearance, Recommend pt initiate Dys 2 texture, thin liquids and chin tuck strategy with all liquids. Pills whole in puree and full assist initially to recall and implement strategy. Pt did well performing chin tuck during study. ST to follow up. SLP Visit Diagnosis Dysphagia, oropharyngeal phase (R13.12) Impact on safety and function Mild aspiration risk;Moderate aspiration risk     02/07/2022  11:15 AM Treatment Recommendations Treatment Recommendations Therapy as outlined in treatment plan below     02/07/2022  11:15 AM Prognosis Prognosis for Safe Diet Advancement Good   02/07/2022  11:15 AM Diet Recommendations SLP Diet Recommendations Dysphagia 2 (Fine chop) solids;Thin liquid Liquid Administration via Cup;No straw Medication Administration Whole meds with puree Compensations  Slow rate;Small sips/bites;Clear throat intermittently;Chin tuck;Other (Comment) Postural Changes Seated upright at 90 degrees;Remain semi-upright after after feeds/meals (Comment)     02/07/2022  11:15 AM Other Recommendations Oral Care Recommendations Oral care BID Follow Up Recommendations Acute inpatient rehab (3hours/day) Assistance recommended at discharge Intermittent Supervision/Assistance Functional Status Assessment Patient has had a recent decline in their functional status and demonstrates the ability to make significant improvements in function in a reasonable and predictable amount of time.   02/07/2022  11:15 AM Frequency and Duration  Speech Therapy Frequency (ACUTE ONLY) min 2x/week Treatment Duration 2 weeks     02/07/2022  11:15 AM Oral Phase Oral Phase Impaired Oral - Thin Teaspoon WFL Oral - Thin Cup Lingual/palatal residue Oral - Thin Straw Lingual/palatal residue Oral - Puree Piecemeal swallowing Oral - Mech Soft Other (Comment);Delayed oral transit;Lingual/palatal residue    02/07/2022  11:15 AM Pharyngeal Phase Pharyngeal Phase Impaired Pharyngeal- Thin Teaspoon Penetration/Aspiration during swallow;Pharyngeal residue - valleculae Pharyngeal Material enters airway, remains ABOVE vocal cords and not ejected out Pharyngeal- Thin Cup Penetration/Aspiration during swallow;Pharyngeal residue - valleculae;Compensatory strategies attempted (with notebox);Other (Comment) Pharyngeal Material enters airway, passes BELOW cords then ejected out;Material enters airway, remains ABOVE vocal cords then  ejected out;Material enters airway, remains ABOVE vocal cords and not ejected out Pharyngeal- Thin Straw Penetration/Aspiration during swallow Pharyngeal Material enters airway, remains ABOVE vocal cords then ejected out Pharyngeal- Puree Pharyngeal residue - valleculae Pharyngeal- Mechanical Soft Pharyngeal residue - valleculae    02/07/2022  11:15 AM Cervical Esophageal Phase  Cervical Esophageal Phase WFL  Houston Siren 02/07/2022, 11:42 AM                     ? ?Anti-infectives: ?Anti-infectives (From admission, onward)  ? ? Start     Dose/Rate Route Frequency Ordered Stop  ? 01/30/22 1000  micafungin (

## 2022-02-08 NOTE — Progress Notes (Signed)
PHARMACY - TOTAL PARENTERAL NUTRITION CONSULT NOTE ? ?Indication: Prolonged ileus with new EC fistula on 01/30/22 ? ?Patient Measurements: ?Height: '5\' 7"'$  (170.2 cm) ?Weight: 48.4 kg (106 lb 11.2 oz) ?IBW/kg (Calculated) : 61.6 ?TPN AdjBW (KG): 48.4 ?Body mass index is 16.71 kg/m?. ?Usual Weight: ~115 lbs ? ?Assessment:  ?40 YOF s/p femoral hernia repair with SBR 2/2 gangrene on 3/23.  Noted poor PO intake for about 6 months due to living situation. Daughter reports pt UBW ~115 lbs with wt loss of 20-30 lbs within past few months. Pharmacy consulted to manage TPN for ileus in pt with severe PCM upon admission. ? ?Glucose / Insulin: CBG up to 80s off TPN, 100-130s on TPN.  Not on SSI. ?Electrolytes: all WNL except slightly low Mag at 1.9 (goal >/= 2) ?Renal: SCr < 1, BUN WNL ?Hepatic: LFTs / tbili / TG WNL, albumin 1.8 ?Intake / Output; MIVF: UOP not charted, LBM 4/13. Net +11.4L  ? ?GI Imaging: ?3/23 Incarcerated L inguinal small-bowel hernia with high-grade SBO ?3/27 KUB - suggestive of postoperative ileus ?4/5 CT - possible EC fistula, hematoma vs seroma, anasarca, liver lesions ?4/12 CT: post-op obstruction vs ileus, large hepatic hemangioma, L inguinal region increased in size but could be post-op changes, multi fibroid uterus, negative for PE ?GI Surgeries / Procedures:  ?3/23 femoral hernia repair with SBR ? ?Central access: PICC placed 01/19/22 ?TPN start date: 01/19/22 ? ?Nutritional Goals: RD Estimated Needs ?Total Energy Estimated Needs: 1600 - 1800 ?Total Protein Estimated Needs: 80 - 95 grams ?Total Fluid Estimated Needs: >/= 1.6 L ? ?Current Nutrition:  ?CLD - Boost TID added 4/14 (did not tolerate trickle feeds 4/04 d/t abd distension) ?TPN ? ?Plan:  ?Continue cyclic TPN over 20 hrs (36-32m/hr, GIR < 5 mcg/kg/min) ?TPN providing 92g AA and 1670 kCal, meeting 100% of needs ?Electrolytes in TPN: Na 157m/L, K 8082mL, Ca 2mE24m, increase Mag 13mE55mon 4/13, Phos 25mmo74m Cl:Ac 1:1 ?Add standard MVI and  trace elements to TPN ?Continue twice daily CBGs to monitor for hypoglycemia off TPN (CBGs at 1500/1700) ?If CBGs off TPN remain > 80 in next several days, could consider reducing TPN cycle ?Standard TPN labs on Mon and Thurs ? ?Thank you for involving pharmacy in this patient's care. ? ?JennifRenold GentamD, BCPS ?Clinical Pharmacist ?Clinical phone for 02/08/2022 until 3p is [phone] ?02/08/2022 7:08 AM ? ?**Pharmacist phone directory can be found on [email or web address] [facility]

## 2022-02-08 NOTE — Progress Notes (Signed)
Speech Language Pathology Treatment: Dysphagia  ?Patient Details ?Name: [name] [name] ?MRN: [id number] ?DOB: [birth year] ?Today's Date: 02/08/2022 ?Time: 1045-1100 ?SLP Time Calculation (min) (ACUTE ONLY): 15 min ? ?Assessment / Plan / Recommendation ?Clinical Impression ? Pt sitting upright and drinking juice via straw and independently tucking chin when therapist arrived. Pt's friend, Fraser Din stated she tucked each time with broth and drinks this morning and did not cough. Today there was no coughing, throat clearing or wet vocal quality although SLP cued to cough as additional protective measure and encouraged this during all subsequent meals. She was sitting upright but therapist increased [place] bringing shoulders to more upright position to obtain a fuller range of motion with her chin tuck and advised to make sure this is her positioning for future meals/med. She continued to tuck chin independently throughout. Will continue to follow for safety and diet advancement when able from GI perspective.  ?  ?HPI HPI: 71 yo female with 1 day of worsening abdominal pain and vomiting, s/p femoral hernia repair with SBR 2/2 gangrene on 3/23. Noted poor po intake for about 6 months due to living situation. Daughter reports pt with wt loss of 20-30# within past few months. SLP initially ordered as pt became acutely more dysarthric, hypophonic this admission. She was then intubated after an episode of emesis with concern for aspiration PNA. ETT 4/1-4/7. Significant PMH: Parkinson's disease (prior course of OP SLP in 2018 for dysarthria/voice, d/c after all goals were met), anxiety ?  ?   ?SLP Plan ? Continue with current plan of care ? ?  ?  ?Recommendations for follow up therapy are one component of a multi-disciplinary discharge planning process, led by the attending physician.  Recommendations may be updated based on patient status, additional functional criteria and insurance authorization. ?  ? ?Recommendations  ?Diet  recommendations: Thin liquid;Other(comment) (clear liquids per MD) ?Liquids provided via: Cup;Straw ?Medication Administration: Whole meds with puree ?Supervision: Patient able to self feed ?Compensations: Slow rate;Small sips/bites;Chin tuck ?Postural Changes and/or Swallow Maneuvers: Seated upright 90 degrees  ?   ?    ?   ? ? ? ? Oral Care Recommendations: Oral care BID ?Follow Up Recommendations: Acute inpatient rehab (3hours/day) (versus home health?) ?Assistance recommended at discharge: Frequent or constant Supervision/Assistance ?SLP Visit Diagnosis: Dysphagia, oropharyngeal phase (R13.12) ?Plan: Continue with current plan of care ? ? ? ? ?  ?  ? ? ?Houston Siren ? ?02/08/2022, 11:16 AM ?

## 2022-02-08 NOTE — Progress Notes (Signed)
Physical Therapy Treatment ?Patient Details ?Name: [name] [name] ?MRN: [id number] ?DOB: [birth year] ?Today's Date: 02/08/2022 ? ? ?History of Present Illness Pt is a 71 y.o. female admitted 01/17/22 with worsening abdominal pain and vomiting; daughter reports poor intake for ~6 months due to living situation, with 20-30# weight loss. S/p L femoral hernia repair with small bowel resection secondary to gangrene 3/23. Transfer to MICU 4/1 secondary to aspiration requiring intubation; ETT 4/1-4/7. PMH includes Parkinson's disease, anxiety. ? ?  ?PT Comments  ? ? Pt admitted with above diagnosis. Pt was able to stand to [place] x 3 x with min assist and cues. Pt felt confident standing in [place] and was moved to chair in [place]. Pt also performed some exercises. Pt happy to be up.   Pt currently with functional limitations due to balance and endurance deficits. Pt will benefit from skilled PT to increase their independence and safety with mobility to allow discharge to the venue listed below.      ?Recommendations for follow up therapy are one component of a multi-disciplinary discharge planning process, led by the attending physician.  Recommendations may be updated based on patient status, additional functional criteria and insurance authorization. ? ?Follow Up Recommendations ? Skilled nursing-short term rehab (<3 hours/day) ?  ?  ?Assistance Recommended at Discharge Frequent or constant Supervision/Assistance  ?Patient can return home with the following A lot of help with bathing/dressing/bathroom;Assistance with feeding;Help with stairs or ramp for entrance;Assist for transportation;A lot of help with walking and/or transfers ?  ?Equipment Recommendations ? Wheelchair cushion (measurements PT);Wheelchair (measurements PT);Hospital bed  ?  ?Recommendations for Other Services   ? ? ?  ?Precautions / Restrictions Precautions ?Precautions: Fall;Other (comment) ?Precaution Comments: cortrak, L abdominal drain to suction;  anxious with mobility ?Restrictions ?Weight Bearing Restrictions: No  ?  ? ?Mobility ? Bed Mobility ?Overal bed mobility: Needs Assistance ?Bed Mobility: Supine to Sit ?  ?  ?Supine to sit: Min assist ?  ?  ?General bed mobility comments: Slight assist to come to EOB with elevation of trunk assist; min assist with use of bed pad to scoot hips to EOB ?  ? ?Transfers ?Overall transfer level: Needs assistance ?  ?Transfers: Sit to/from Stand ?Sit to Stand: Min assist, +2 safety/equipment, From elevated surface ?  ?  ?  ?  ?  ?General transfer comment: Min assist for trunk elevation to RW, increased time and effort achieve fully upright posture and achieve anterior weight translation with min cues; pt sat on Stedy and was moved to the chair.  Stood again and pt did work on [employer] shifting.  Pt even dancing a little. She was very happy to get to chair. ?Transfer via Lift Equipment: Stedy ? ?Ambulation/Gait ?  ?  ?  ?  ?  ?  ?  ?  ? ? ?Stairs ?  ?  ?  ?  ?  ? ? ?Wheelchair Mobility ?  ? ?Modified Rankin (Stroke Patients Only) ?  ? ? ?  ?Balance Overall balance assessment: Needs assistance ?Sitting-balance support: No upper extremity supported, Feet supported ?Sitting balance-Leahy Scale: Fair ?Sitting balance - Comments: can sit EOB unsupported ?  ?Standing balance support: Bilateral upper extremity supported, Reliant on assistive device for balance ?Standing balance-Leahy Scale: Poor ?Standing balance comment: reliant on BUE support and external assist; posterior bias ?  ?  ?  ?  ?  ?  ?  ?  ?  ?  ?  ?  ? ?  ?Cognition  Arousal/Alertness: Awake/alert ?Behavior During Therapy: [facility] for tasks assessed/performed, Flat affect ?Overall Cognitive Status: Impaired/Different from baseline ?Area of Impairment: Following commands, Problem solving ?  ?  ?  ?  ?  ?  ?  ?  ?  ?  ?  ?Following Commands: Follows one step commands with increased time ?  ?  ?Problem Solving: Slow processing, Decreased initiation, Requires verbal  cues ?General Comments: pt with improved affect and interaction this session, speech quality difficult to understand; increased time to follow commands, anxious regarding mobility but responds well to encouragement and step by step instructions ?  ?  ? ?  ?Exercises General Exercises - Lower Extremity ?Ankle Circles/Pumps: AROM, Both, Supine, 5 reps ?Quad Sets: AROM, Right, Left, 10 reps, Supine ?Long Arc Quad: AROM, Both, Seated, 15 reps ? ?  ?General Comments   ?  ?  ? ?Pertinent Vitals/Pain Pain Assessment ?Pain Assessment: Faces ?Faces Pain Scale: Hurts little more ?Pain Location: general ?Pain Descriptors / Indicators: Dull ?Pain Intervention(s): Limited activity within patient's tolerance, Monitored during session, Repositioned  ? ? ?Home Living   ?  ?  ?  ?  ?  ?  ?  ?  ?  ?   ?  ?Prior Function    ?  ?  ?   ? ?PT Goals (current goals can now be found in the care plan section) Acute Rehab PT Goals ?Patient Stated Goal: agreeable to rehab ?Progress towards PT goals: Progressing toward goals ? ?  ?Frequency ? ? ? Min 3X/week ? ? ? ?  ?PT Plan Current plan remains appropriate  ? ? ?Co-evaluation   ?  ?  ?  ?  ? ?  ?AM-PAC PT "6 Clicks" Mobility   ?Outcome Measure ? Help needed turning from your back to your side while in a flat bed without using bedrails?: A Little ?Help needed moving from lying on your back to sitting on the side of a flat bed without using bedrails?: A Little ?Help needed moving to and from a bed to a chair (including a wheelchair)?: Total ?Help needed standing up from a chair using your arms (e.g., wheelchair or bedside chair)?: A Little ?Help needed to walk in hospital room?: Total ?Help needed climbing 3-5 steps with a railing? : Total ?6 Click Score: 12 ? ?  ?End of Session Equipment Utilized During Treatment: Gait belt ?Activity Tolerance: Patient tolerated treatment well;Patient limited by fatigue ?Patient left: in chair;with call bell/phone within reach;with chair alarm set;with  family/visitor present ?Nurse Communication: Mobility status (use Stedy to get pt back) ?PT Visit Diagnosis: Muscle weakness (generalized) (M62.81);Difficulty in walking, not elsewhere classified (R26.2) ?  ? ? ?Time: 0881-1031 ?PT Time Calculation (min) (ACUTE ONLY): 19 min ? ?Charges:  $Therapeutic Activity: 8-22 mins          ?          ? ?Adaysha Dubinsky M,PT ?Acute Rehab Services ?[phone] ?[phone] (pager)  ? ? ?Zeno Hickel F Markus Casten ?02/08/2022, 2:08 PM ? ?

## 2022-02-08 NOTE — Progress Notes (Signed)
Nutrition Follow-up ? ?DOCUMENTATION CODES:  ?Severe malnutrition in context of social or environmental circumstances, Underweight ? ?INTERVENTION:  ?-continue TPN per Pharmacy ?-discontinue Boost Breeze ?-Ensure Enlive po TID, each supplement provides 350 kcal and 20 grams of protein. ? ?NUTRITION DIAGNOSIS:  ?Severe Malnutrition related to social / environmental circumstances as evidenced by severe muscle depletion, severe fat depletion, percent weight loss. ?Ongoing  ? ?GOAL:  ?Patient will meet greater than or equal to 90% of their needs ?Met with TPN. ? ?MONITOR:  ?Diet advancement, Labs, Weight trends ? ?REASON FOR ASSESSMENT:  ?Malnutrition Screening Tool ?  ? ?ASSESSMENT:  ?71 y.o. female presented to [name] with abdominal pain with vomiting and constipation for 7 days. PMH includes Parkinson disease and GERD. Pt admitted with incarcerated left inguinal hernia w/ obstructions. ? ?3/23 S/P resection of necrotic small bowel. ?3/25 TPN initiated d/t post-op ileus.  ?3/29 NG tube removed.  ?3/30 Started on clear liquids. ?3/31 Advanced to full liquids. ?4/1 Transferred to [name] ICU and intubated. NG tube was replaced. ?4/4 Trial of trickle feedings: Vital AF 1.2 at 20 ml/h.  ?4/5 Trickle feedings stopped d/t abdominal distention; CT scan showed EC fistula. ?4/7 extubated ?4/10 NGT replaced with Cortrak (tip at region of [name] pylorus per xray) ?4/13 diet advanced to clear liquids ?4/14 diet advanced to dysphagia 2 with thin liquids and cortrak removed  ? ?Per MD, pt tolerating clears, so medications have been changed to PO and Cortrak has been discontinued. Anticipating long-term TPN, so pt being referred to [facility]. SLP following; diet just advanced to dysphagia 2 with thin liquids. SLP following. Will order ONS to supplement PO intake.  ? ?Patient continues to receive TPN. Currently receiving cyclic TPN over 20 hrs (36-72 ml/hr, GIR < 5 mcg/kg/min) ?TPN providing 92g AA and 1670 kCal, meeting 100% of needs ? ?UOP:  567ml x24 hours ?EC Fistula: none documented x24 hours ?I/O: +13.8L since admit  ? ?Medications: vitamin C, protonix  ?Labs: ?Recent Labs  ?Lab 02/04/22 ?0430 02/05/22 ?0450 02/06/22 ?0320 02/07/22 ?0354  ?NA 135 136 133* 137  ?K 4.2 4.2 4.1 4.0  ?CL 105 105 105 108  ?CO2 $Rem'25 26 22 25  'LQZK$ ?BUN $Re'15 18 18 15  'XMk$ ?CREATININE 0.44 0.49 0.44 0.44  ?CALCIUM 8.3* 8.4* 8.0* 8.4*  ?MG 2.0  --   --  1.9  ?PHOS 3.3  --   --  3.2  ?GLUCOSE 112* 110* 100* 109*  ?CBGs: 85-123 x24 hours ? ?Diet Order:   ?Diet Order   ? ?       ?  DIET DYS 2 Room service appropriate? Yes; Fluid consistency: Thin  Diet effective now       ?  ? ?  ?  ? ?  ? ?EDUCATION NEEDS:  ?No education needs have been identified at this time ? ?Skin:  Skin Assessment: Reviewed RN Assessment ?Skin Integrity Issues:: Stage II ?Stage I: - ?Stage II: sacrum ? ?Last BM:  4/13 type 7 ? ?Height:  ?Ht Readings from Last 1 Encounters:  ?02/03/22 $RemoveBe'5\' 7"'YbFsCMgfw$  (1.702 m)  ? ?Weight:  ?Wt Readings from Last 1 Encounters:  ?02/08/22 11.9 kg  ? ?Ideal Body Weight:  61.4 kg ? ?BMI:  Body mass index is 4.11 kg/m?. ? ?Estimated Nutritional Needs:  ?Kcal:  1600 - 1800 ?Protein:  80 - 95 grams ?Fluid:  >/= 1.6 L ? ? ?Theone Stanley., MS, RD, LDN (she/her/hers) ?RD pager number and weekend/on-call pager number located in [place].                                               ? ?

## 2022-02-09 DIAGNOSIS — K4130 Unilateral femoral hernia, with obstruction, without gangrene, not specified as recurrent: Secondary | ICD-10-CM | POA: Diagnosis not present

## 2022-02-09 LAB — CBC
HCT: 27.9 % — ABNORMAL LOW (ref 36.0–46.0)
Hemoglobin: 9 g/dL — ABNORMAL LOW (ref 12.0–15.0)
MCH: 29.6 pg (ref 26.0–34.0)
MCHC: 32.3 g/dL (ref 30.0–36.0)
MCV: 91.8 fL (ref 80.0–100.0)
Platelets: 463 10*3/uL — ABNORMAL HIGH (ref 150–400)
RBC: 3.04 MIL/uL — ABNORMAL LOW (ref 3.87–5.11)
RDW: 14.6 % (ref 11.5–15.5)
WBC: 8.8 10*3/uL (ref 4.0–10.5)
nRBC: 0 % (ref 0.0–0.2)

## 2022-02-09 LAB — BASIC METABOLIC PANEL
Anion gap: 4 — ABNORMAL LOW (ref 5–15)
BUN: 19 mg/dL (ref 8–23)
CO2: 24 mmol/L (ref 22–32)
Calcium: 8.6 mg/dL — ABNORMAL LOW (ref 8.9–10.3)
Chloride: 107 mmol/L (ref 98–111)
Creatinine, Ser: 0.41 mg/dL — ABNORMAL LOW (ref 0.44–1.00)
GFR, Estimated: >60 mL/min (ref >60)
Glucose, Bld: 94 mg/dL (ref 70–99)
Potassium: 4.1 mmol/L (ref 3.5–5.1)
Sodium: 135 mmol/L (ref 135–145)

## 2022-02-09 LAB — GLUCOSE, CAPILLARY
Glucose-Capillary: 87 mg/dL (ref 70–99)
Glucose-Capillary: 106 mg/dL — ABNORMAL HIGH (ref 70–99)

## 2022-02-09 MED ORDER — TRACE MINERALS CU-MN-SE-ZN 300-55-60-3000 MCG/ML IV SOLN
INTRAVENOUS | Status: DC
  Filled 2022-02-09: qty 611.07

## 2022-02-09 MED ORDER — QUETIAPINE FUMARATE 25 MG PO TABS
25.0000 mg | ORAL_TABLET | Freq: Every day | ORAL | Status: DC
  Administered 2022-02-09 – 2022-02-13 (×5): 25 mg via ORAL
  Filled 2022-02-09 (×5): qty 1

## 2022-02-09 MED ORDER — TRACE MINERALS CU-MN-SE-ZN 300-55-60-3000 MCG/ML IV SOLN
INTRAVENOUS | Status: DC
  Filled 2022-02-09: qty 611.04

## 2022-02-09 NOTE — Progress Notes (Signed)
PHARMACY - TOTAL PARENTERAL NUTRITION CONSULT NOTE ? ?Indication: Prolonged ileus with new EC fistula on 01/30/22 ? ?Patient Measurements: ?Height: '5\' 7"'$  (170.2 cm) ?Weight: 47.7 kg (105 lb 2.6 oz) ?IBW/kg (Calculated) : 61.6 ?TPN AdjBW (KG): 48.4 ?Body mass index is 16.47 kg/m?. ?Usual Weight: ~115 lbs ? ?Assessment:  ?[name] [name] s/p femoral hernia repair with SBR 2/2 gangrene on 3/23.  Noted poor PO intake for about 6 months due to living situation. Daughter reports pt UBW ~115 lbs with wt loss of 20-30 lbs within past few months. Pharmacy consulted to manage TPN for ileus in pt with severe PCM upon admission. ? ?Glucose / Insulin: CBG 80s off TPN x3d, 100-130s on TPN.  Not on SSI. ?Electrolytes: all WNL except slightly low Mag at 1.9 (goal >/= 2) and CoCa ~10.4 ?Renal: SCr < 1, BUN WNL ?Hepatic: LFTs / tbili / TG WNL, albumin 1.8 ?Intake / Output; MIVF: UOP charting not accurate, LBM 4/14. Fistula output low. Net +9.2L  ? ?GI Imaging: ?3/23 Incarcerated L inguinal small-bowel hernia with high-grade SBO ?3/27 KUB - suggestive of postoperative ileus ?4/5 CT - possible EC fistula, hematoma vs seroma, anasarca, liver lesions ?4/12 CT: post-op obstruction vs ileus, large hepatic hemangioma, L inguinal region increased in size but could be post-op changes, multi fibroid uterus, negative for PE ?GI Surgeries / Procedures:  ?3/23 femoral hernia repair with SBR ? ?Central access: PICC placed 01/19/22 ?TPN start date: 01/19/22 ? ?Nutritional Goals: RD Estimated Needs ?Total Energy Estimated Needs: 1600 - 1800 ?Total Protein Estimated Needs: 80 - 95 grams ?Total Fluid Estimated Needs: >/= 1.6 L ? ?Current Nutrition:  ?Dys 2 - Ensure TID - 1 Boost + 2 Ensure charted as given 4/14 ?TPN ? ?Plan:  ?Trial cyclic TPN over 18 hrs (40-57m/hr, GIR 2.52-5.03 mcg/kg/min) ?TPN providing 92g AA and 1670 kCal, meeting 100% of needs ?Electrolytes in TPN: Na 1514m/L, K 8028mL, decr Ca 1mE37m, increase Mag 13mE69mon 4/13, Phos 25mmo84m Cl:Ac  1:1 ?Add standard MVI and trace elements to TPN ?Continue twice daily CBGs to monitor for hypoglycemia off TPN (CBGs at 1300/1700) ?If CBGs off TPN remain > 80 in next several days, could consider reducing TPN cycle further ?Standard TPN labs on Mon and Thurs ? ?Thank you for involving pharmacy in this patient's care. ? ?JennifRenold GentamD, BCPS ?Clinical Pharmacist ?Clinical phone for 02/09/2022 until 3p is [phone] ?02/09/2022 7:14 AM ? ?**Pharmacist phone directory can be found on [email or web address] under [email or web address]

## 2022-02-09 NOTE — Progress Notes (Signed)
Consultation Progress Note ? ? ?Patient: [name] [name] WLN:[id number] DOB: Mar 23, 1951 DOA: 01/17/2022 ?DOS: the patient was seen and examined on 02/09/2022 ?Primary service: Edison Pace, Md, MD ? ?Brief hospital course: ?History of Parkinson's admitted with incarcerated gangrenous left femoral hernia  ?Open repair and anastomosis 3/23  ?Aspiration pneumonia, hypoxemia and intubation, improved now.   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   ? ?Assessment and Plan: ? ?Femoral hernia of left side with obstruction, intestinal gangrene and enterocutaneous fistula ?Tolerating dysphagia diet. ?Currently on TPN.  Plan to continue today.  Possibly can come off TPN if able to keep up with oral nutrition. ?Completed antibiotic therapy. ?As per surgery. ? ?Septic shock secondary to acute respiratory failure with hypoxia (HCC) secondary to aspiration pneumonia ?Clinically improved.  Completed antibiotic therapy.  Aspiration precautions.  Respiratory therapy and chest.  Therapy. ? ?Parkinson disease ([place]) ?-Trial of increasing Sinemet to 4 times daily with last dose to be given at 10 PM.  Symptoms improved. ? ?Failure to thrive in adult ?-Severely malnourished baseline clearly predates her acute illness. BMI is < 15.  ?-Palliative have been consulted, currently plan is for aggressive measures. ?-Continue TPN until adequate oral intake. ? ?Anxiety ?Patient had been on buspar '5mg'$  BID and valium '2mg'$  (though this was prn vertigo) in the outpatient setting.  ?Family reported hallucination with the BuSpar and no relief of anxiety with Valium. ?Patient reported good symptom control with Seroquel at night.  Discontinue Valium and BuSpar. ?Seroquel 25 mg daily at night, will prescribe on discharge.  Melatonin 3 mg at night. ? ? ? ? ? ? ?TRH will continue to follow the patient along with you.  ? ?Subjective: Seen and examined.  No overnight events.  Patient is pleasant and comfortable.  Denies any abdominal pain. ?She did eat some dinner  last night and denies any nausea vomiting. ?Minimal output through the fistula. ?Still had loose bowel movement but lesser since last 24 hours.  4 episodes. ? ?Physical Exam: ?Vitals:  ? 02/08/22 2107 02/09/22 7408 02/09/22 0556 02/09/22 0721  ?BP: 95/61 109/70  111/65  ?Pulse: 94 96  87  ?Resp: '18 16  14  '$ ?Temp: 98.1 ?F (36.7 ?C) 98.3 ?F (36.8 ?C)  (!) 97.4 ?F (36.3 ?C)  ?TempSrc: Oral Oral  Oral  ?SpO2: 100% 100%  100%  ?Weight:   47.7 kg   ?Height:      ? ?Looks comfortable.  On room air.  Frail and cachectic. ?Lungs bilateral clear. ?Abdomen soft nontender.  Bowel sound present. ?Left groin pouch with minimum feculent material. ? ? ? ? ?Data Reviewed: ? ?Reviewed labs and imaging as noted above. ? ?Family Communication: 2 daughters on the phone. ? ?Time spent: 35 minutes  ? ?Author: ?Barb Merino, MD ?02/09/2022 10:56 AM ? ?For on call review [email or web address].  ?

## 2022-02-09 NOTE — Progress Notes (Signed)
? ? ?Assessment & Plan: ?POD#22 - S/p emergent left femoral hernia repair with Phasix mesh/ small bowel resection 01/17/22 - Dr. Kieth Brightly for incarcerated left femoral hernia.  Now with EC fistula through incision ?- EC fistula output 40 cc overnight on D2 diet - continue to monitor ?- continue TPN for now, until patient has adequate oral intake if ECF output remains low ?- does not need further abx from surgical standpoint ? ?      Armandina Gemma, MD ?      [facility] ?      Office: [phone] ? ? ?Chief Complaint: ?Incarcerated femoral hernia, repair complicated by enterocutaneous fistula formation ? ?Subjective: ?Patient in bed, nursing in room.  No complaints.  Tolerated dysphagia diet.  Some increase in fistula output overnight. ? ?Objective: ?Vital signs in last 24 hours: ?Temp:  [97.4 ?F (36.3 ?C)-98.3 ?F (36.8 ?C)] 97.4 ?F (36.3 ?C) (04/15 0071) ?Pulse Rate:  [87-100] 87 (04/15 0721) ?Resp:  [14-18] 14 (04/15 0721) ?BP: (95-111)/(61-70) 111/65 (04/15 0721) ?SpO2:  [100 %] 100 % (04/15 0721) ?Weight:  [11.9 kg-47.7 kg] 47.7 kg (04/15 0556) ?Last BM Date : 02/08/22 ? ?Intake/Output from previous day: ?04/14 0701 - 04/15 0700 ?In: 250.9 [P.O.:240; I.V.:10.9] ?Out: 340 [Urine:300; Drains:40] ?Intake/Output this shift: ?No intake/output data recorded. ? ?Physical Exam: ?HEENT - sclerae clear, mucous membranes moist ?Neck - soft ?Abdomen - pouch in left groin with yellow/gold output ? ?Lab Results:  ?Recent Labs  ?  02/09/22 ?0306  ?WBC 8.8  ?HGB 9.0*  ?HCT 27.9*  ?PLT 463*  ? ?BMET ?Recent Labs  ?  02/07/22 ?0354 02/09/22 ?0306  ?NA 137 135  ?K 4.0 4.1  ?CL 108 107  ?CO2 25 24  ?GLUCOSE 109* 94  ?BUN 15 19  ?CREATININE 0.44 0.41*  ?CALCIUM 8.4* 8.6*  ? ?PT/INR ?No results for input(s): LABPROT, INR in the last 72 hours. ?Comprehensive Metabolic Panel: ?   ?Component Value Date/Time  ? NA 135 02/09/2022 0306  ? NA 137 02/07/2022 0354  ? K 4.1 02/09/2022 0306  ? K 4.0 02/07/2022 0354  ? CL 107  02/09/2022 0306  ? CL 108 02/07/2022 0354  ? CO2 24 02/09/2022 0306  ? CO2 25 02/07/2022 0354  ? BUN 19 02/09/2022 0306  ? BUN 15 02/07/2022 0354  ? CREATININE 0.41 (L) 02/09/2022 0306  ? CREATININE 0.44 02/07/2022 0354  ? CREATININE 0.79 06/03/2018 1103  ? CREATININE 0.70 04/21/2018 1345  ? GLUCOSE 94 02/09/2022 0306  ? GLUCOSE 109 (H) 02/07/2022 0354  ? CALCIUM 8.6 (L) 02/09/2022 0306  ? CALCIUM 8.4 (L) 02/07/2022 0354  ? AST 31 02/07/2022 0354  ? AST 32 02/04/2022 0430  ? AST 17 06/03/2018 1103  ? AST 21 04/21/2018 1345  ? ALT 22 02/07/2022 0354  ? ALT 11 02/04/2022 0430  ? ALT 11 06/03/2018 1103  ? ALT 18 04/21/2018 1345  ? ALKPHOS 56 02/07/2022 0354  ? ALKPHOS 65 02/04/2022 0430  ? BILITOT 0.3 02/07/2022 0354  ? BILITOT 0.6 02/04/2022 0430  ? BILITOT 0.5 06/03/2018 1103  ? BILITOT 0.8 04/21/2018 1345  ? PROT 5.8 (L) 02/07/2022 0354  ? PROT 5.6 (L) 02/04/2022 0430  ? ALBUMIN 1.8 (L) 02/07/2022 0354  ? ALBUMIN 1.7 (L) 02/04/2022 0430  ? ? ?Studies/Results: ?DG Swallowing Func-Speech Pathology ? ?Result Date: 02/07/2022 ?Table formatting from the original result was not included. Objective Swallowing Evaluation: Type of Study: MBS-Modified Barium Swallow Study  Patient Details Name: [name] [name]  MRN: [id number] Date of Birth: Jun 01, 1951 Today's Date: 02/07/2022 Time: SLP Start Time (ACUTE ONLY): 1004 -SLP Stop Time (ACUTE ONLY): 1022 SLP Time Calculation (min) (ACUTE ONLY): 18 min Past Medical History: Past Medical History: Diagnosis Date  Fibroid   GERD (gastroesophageal reflux disease)   Muscle weakness of lower extremity 01/17/2015  Osteopenia   Parkinson disease ([place]) 01/17/2015 Past Surgical History: Past Surgical History: Procedure Laterality Date  FINGER SURGERY    INGUINAL HERNIA REPAIR Left 01/17/2022  Procedure: Left femoral hernia repair with mesh and small bowel resection and anastomosis;  Surgeon: Kinsinger, Arta Bruce, MD;  Location: [facility];  Service: General;  Laterality: Left;  MOUTH SURGERY    TOE  SURGERY    TONSILLECTOMY    TUBAL LIGATION    x2 HPI: 71 yo female with 1 day of worsening abdominal pain and vomiting, s/p femoral hernia repair with SBR 2/2 gangrene on 3/23. Noted poor po intake for about 6 months due to living situation. Daughter reports pt with wt loss of 20-30# within past few months. SLP initially ordered as pt became acutely more dysarthric, hypophonic this admission. She was then intubated after an episode of emesis with concern for aspiration PNA. ETT 4/1-4/7. Significant PMH: Parkinson's disease (prior course of OP SLP in 2018 for dysarthria/voice, d/c after all goals were met), anxiety  Subjective: pt feels overwhelmed  Recommendations for follow up therapy are one component of a multi-disciplinary discharge planning process, led by the attending physician.  Recommendations may be updated based on patient status, additional functional criteria and insurance authorization. Assessment / Plan / Recommendation   02/07/2022  11:15 AM Clinical Impressions Clinical Impression Pt exhibited mild oropharyngeal dysphagia with brief aspiraiton during study. Oral phase marked by lingual residue with thin and mechancical soft, piecemeal transit pattern and delayed transit. Her dentures/implants are ill fitting therefore not donned and pt used tongue and gums to mash the soft cereal bar texture with mild delays and left sided residual. Her hyolaryngeal excursion was within normal range and epiglottic deflection was complete although late which led to penetration with thin teaspoon (PAS 3) and brief trace aspiration with cup (PAS 6). Pt was verbally and visually educated to perform chin tuck which consistently resulted in no penetration or flash (PAS 2) throughout subsequent trials. Vallecular residue was intermittent and minimal with puree, thin and spontaneous swallow cleared. Esophagus was scanned and although not diagnosed during MBS, revealed residue in distal esophgus. Several trials there appeared  to be slower transit through upper esophagus. At times she spontaneously kept larynx elevated after swallow Caryl Ada) perhaps to assist in upper esophageal clearance, Recommend pt initiate Dys 2 texture, thin liquids and chin tuck strategy with all liquids. Pills whole in puree and full assist initially to recall and implement strategy. Pt did well performing chin tuck during study. ST to follow up. SLP Visit Diagnosis Dysphagia, oropharyngeal phase (R13.12) Impact on safety and function Mild aspiration risk;Moderate aspiration risk     02/07/2022  11:15 AM Treatment Recommendations Treatment Recommendations Therapy as outlined in treatment plan below     02/07/2022  11:15 AM Prognosis Prognosis for Safe Diet Advancement Good   02/07/2022  11:15 AM Diet Recommendations SLP Diet Recommendations Dysphagia 2 (Fine chop) solids;Thin liquid Liquid Administration via Cup;No straw Medication Administration Whole meds with puree Compensations Slow rate;Small sips/bites;Clear throat intermittently;Chin tuck;Other (Comment) Postural Changes Seated upright at 90 degrees;Remain semi-upright after after feeds/meals (Comment)     02/07/2022  11:15 AM Other Recommendations Oral Care  Recommendations Oral care BID Follow Up Recommendations Acute inpatient rehab (3hours/day) Assistance recommended at discharge Intermittent Supervision/Assistance Functional Status Assessment Patient has had a recent decline in their functional status and demonstrates the ability to make significant improvements in function in a reasonable and predictable amount of time.   02/07/2022  11:15 AM Frequency and Duration  Speech Therapy Frequency (ACUTE ONLY) min 2x/week Treatment Duration 2 weeks     02/07/2022  11:15 AM Oral Phase Oral Phase Impaired Oral - Thin Teaspoon WFL Oral - Thin Cup Lingual/palatal residue Oral - Thin Straw Lingual/palatal residue Oral - Puree Piecemeal swallowing Oral - Mech Soft Other (Comment);Delayed oral transit;Lingual/palatal  residue    02/07/2022  11:15 AM Pharyngeal Phase Pharyngeal Phase Impaired Pharyngeal- Thin Teaspoon Penetration/Aspiration during swallow;Pharyngeal residue - valleculae Pharyngeal Material enters airway,

## 2022-02-10 DIAGNOSIS — K4130 Unilateral femoral hernia, with obstruction, without gangrene, not specified as recurrent: Secondary | ICD-10-CM | POA: Diagnosis not present

## 2022-02-10 MED ORDER — ADULT MULTIVITAMIN W/MINERALS CH
1.0000 | ORAL_TABLET | Freq: Every day | ORAL | Status: DC
  Administered 2022-02-11 – 2022-02-14 (×4): 1 via ORAL
  Filled 2022-02-10 (×4): qty 1

## 2022-02-10 NOTE — Progress Notes (Signed)
? ? ?  Assessment & Plan: ?POD#23 - S/p emergent left femoral hernia repair with Phasix mesh/ small bowel resection 01/17/22 - Dr. Kieth Brightly; EC fistula ?- EC fistula output 5 cc overnight on D2 diet per nurse this AM ?- discontinue TNA this evening, encourage po intake ?- discharge planning, wound care ? ?      Armandina Gemma, MD ?      [facility] ?      Office: [phone] ? ? ?Chief Complaint: ?Enterocutaneous fistula left groin ? ?Subjective: ?Patient in bed, comfortable.  Tolerating diet. ? ?Objective: ?Vital signs in last 24 hours: ?Temp:  [97.6 ?F (36.4 ?C)-97.9 ?F (36.6 ?C)] 97.6 ?F (36.4 ?C) (04/16 8338) ?Pulse Rate:  [91-98] 98 (04/16 0817) ?Resp:  [14] 14 (04/16 0817) ?BP: (103-124)/(69-79) 103/69 (04/16 2505) ?SpO2:  [97 %-100 %] 100 % (04/16 0817) ?Last BM Date : 02/09/22 ? ?Intake/Output from previous day: ?04/15 0701 - 04/16 0700 ?In: 874.8 [I.V.:874.8] ?Out: 5 [Drains:5] ?Intake/Output this shift: ?No intake/output data recorded. ? ?Physical Exam: ?HEENT - sclerae clear, mucous membranes moist ?Abdomen - small yellow/gold output in Eakin's pouch overnight ?Neuro - alert & oriented, no focal deficits ? ?Lab Results:  ?Recent Labs  ?  02/09/22 ?0306  ?WBC 8.8  ?HGB 9.0*  ?HCT 27.9*  ?PLT 463*  ? ?BMET ?Recent Labs  ?  02/09/22 ?3976  ?NA 135  ?K 4.1  ?CL 107  ?CO2 24  ?GLUCOSE 94  ?BUN 19  ?CREATININE 0.41*  ?CALCIUM 8.6*  ? ?PT/INR ?No results for input(s): LABPROT, INR in the last 72 hours. ?Comprehensive Metabolic Panel: ?   ?Component Value Date/Time  ? NA 135 02/09/2022 0306  ? NA 137 02/07/2022 0354  ? K 4.1 02/09/2022 0306  ? K 4.0 02/07/2022 0354  ? CL 107 02/09/2022 0306  ? CL 108 02/07/2022 0354  ? CO2 24 02/09/2022 0306  ? CO2 25 02/07/2022 0354  ? BUN 19 02/09/2022 0306  ? BUN 15 02/07/2022 0354  ? CREATININE 0.41 (L) 02/09/2022 0306  ? CREATININE 0.44 02/07/2022 0354  ? CREATININE 0.79 06/03/2018 1103  ? CREATININE 0.70 04/21/2018 1345  ? GLUCOSE 94 02/09/2022 0306  ?  GLUCOSE 109 (H) 02/07/2022 0354  ? CALCIUM 8.6 (L) 02/09/2022 0306  ? CALCIUM 8.4 (L) 02/07/2022 0354  ? AST 31 02/07/2022 0354  ? AST 32 02/04/2022 0430  ? AST 17 06/03/2018 1103  ? AST 21 04/21/2018 1345  ? ALT 22 02/07/2022 0354  ? ALT 11 02/04/2022 0430  ? ALT 11 06/03/2018 1103  ? ALT 18 04/21/2018 1345  ? ALKPHOS 56 02/07/2022 0354  ? ALKPHOS 65 02/04/2022 0430  ? BILITOT 0.3 02/07/2022 0354  ? BILITOT 0.6 02/04/2022 0430  ? BILITOT 0.5 06/03/2018 1103  ? BILITOT 0.8 04/21/2018 1345  ? PROT 5.8 (L) 02/07/2022 0354  ? PROT 5.6 (L) 02/04/2022 0430  ? ALBUMIN 1.8 (L) 02/07/2022 0354  ? ALBUMIN 1.7 (L) 02/04/2022 0430  ? ? ?Studies/Results: ?No results found. ? ? ? ?Armandina Gemma ?02/10/2022 ? ? Patient ID: [name] [name], female   DOB: 1951-03-27, 71 y.o.   MRN: [id number] ? ?

## 2022-02-10 NOTE — Progress Notes (Signed)
Consultation Progress Note ? ? ?Patient: [name] [name] KXF:[id number] DOB: 03-16-1951 DOA: 01/17/2022 ?DOS: the patient was seen and examined on 02/10/2022 ?Primary service: Edison Pace, Md, MD ? ?Brief hospital course: ?History of Parkinson's admitted with incarcerated gangrenous left femoral hernia  ?Open repair and anastomosis 3/23  ?Aspiration pneumonia, hypoxemia and intubation, improved now.   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   ? ?Assessment and Plan: ? ?Femoral hernia of left side with obstruction, intestinal gangrene and enterocutaneous fistula ?Tolerating dysphagia 2 diet.  Currently on TPN.  Surgery planning to wean off TPN today. ?We will ask speech therapy if patient is able to have regular consistency meal.  Surgery is okay for regular diet.  Completed antibiotic therapy. ? ?Septic shock secondary to acute respiratory failure with hypoxia (HCC) secondary to aspiration pneumonia ?Clinically improved.  Completed antibiotic therapy.  Aspiration precautions.  Respiratory therapy and chest.  Therapy. ? ?Parkinson disease ([place]) ?-Trial of increasing Sinemet to 4 times daily with last dose to be given at 10 PM.  Symptoms improved. ? ?Failure to thrive in adult ?-Severely malnourished baseline clearly predates her acute illness. BMI is < 15.  ?-Palliative have been consulted, currently plan is for aggressive measures. ?-Encourage oral supplements and regular diet. ? ?Anxiety ?Family reported hallucination with the BuSpar and no relief of anxiety with Valium. ?Patient reported good symptom control with Seroquel at night.  Discontinue Valium and BuSpar. ?Seroquel 25 mg daily at night, will prescribe on discharge.  Melatonin 3 mg at night. ? ?Continue to work with PT OT.  She can potentially go to skilled nursing facility if able to come off TPN. ? ? ? ? ? ? ?TRH will continue to follow the patient along with you.  ? ?Subjective: Patient seen and examined.  No overnight events.  Nursing reported no overnight  events.  Patient reported that she is comfortable.  Had some difficulty keeping her head up because of neck muscle weakness but she will continue to work on it. ? ?Physical Exam: ?Vitals:  ? 02/09/22 1552 02/09/22 2055 02/10/22 0448 02/10/22 0817  ?BP: 118/69 110/71 124/79 103/69  ?Pulse: 98 93 91 98  ?Resp:    14  ?Temp: 97.9 ?F (36.6 ?C) 97.8 ?F (36.6 ?C) 97.9 ?F (36.6 ?C) 97.6 ?F (36.4 ?C)  ?TempSrc: Oral Oral Axillary Oral  ?SpO2: 100% 99% 97% 100%  ?Weight:      ?Height:      ? ?Looks comfortable.  On room air.  Frail and cachectic. ?Lungs bilateral clear. ?Abdomen soft nontender.  Bowel sound present. ?Left groin pouch with trace feculent material. ? ? ? ? ?Data Reviewed: ? ?Reviewed labs and imaging as noted above. ? ?Family Communication: 2 daughters on the phone 4/15. ? ?Time spent: 35 minutes  ? ?Author: ?Barb Merino, MD ?02/10/2022 11:43 AM ? ?For on call review [email or web address]  ?

## 2022-02-10 NOTE — Progress Notes (Signed)
PHARMACY - TOTAL PARENTERAL NUTRITION CONSULT NOTE ? ?Indication: Prolonged ileus with new EC fistula on 01/30/22 ? ?Patient Measurements: ?Height: '5\' 7"'$  (170.2 cm) ?Weight: 47.7 kg (105 lb 2.6 oz) ?IBW/kg (Calculated) : 61.6 ?TPN AdjBW (KG): 48.4 ?Body mass index is 16.47 kg/m?. ?Usual Weight: ~115 lbs ? ?Assessment:  ?52 YOF s/p femoral hernia repair with SBR 2/2 gangrene on 3/23.  Noted poor PO intake for about 6 months due to living situation. Daughter reports pt UBW ~115 lbs with wt loss of 20-30 lbs within past few months. Pharmacy consulted to manage TPN for ileus in pt with severe PCM upon admission. ? ?Glucose / Insulin: CBG 80s off TPN x4d, 100-130s on TPN.  Not on SSI. ?Electrolytes: all WNL except slightly low Mag at 1.9 (goal >/= 2) and CoCa ~10.4 ?Renal: SCr < 1, BUN WNL ?Hepatic: LFTs / tbili / TG WNL, albumin 1.8 ?Intake / Output; MIVF: UOP not charted, LBM 4/14. Fistula output 78m. Net +7.5L  ? ?GI Imaging: ?3/23 Incarcerated L inguinal small-bowel hernia with high-grade SBO ?3/27 KUB - suggestive of postoperative ileus ?4/5 CT - possible EC fistula, hematoma vs seroma, anasarca, liver lesions ?4/12 CT: post-op obstruction vs ileus, large hepatic hemangioma, L inguinal region increased in size but could be post-op changes, multi fibroid uterus, negative for PE ?GI Surgeries / Procedures:  ?3/23 femoral hernia repair with SBR ? ?Central access: PICC placed 01/19/22 ?TPN start date: 01/19/22 ? ?Nutritional Goals: RD Estimated Needs ?Total Energy Estimated Needs: 1600 - 1800 ?Total Protein Estimated Needs: 80 - 95 grams ?Total Fluid Estimated Needs: >/= 1.6 L ? ?Current Nutrition:  ?Dys 2 -  ?Ensure TID - 1 of 3 charted as given ?TPN ? ?Plan:  ?Wean TPN per Surgery ?Spoke with RN - decrease rate of TPN to 40 ml/hr until it runs out ?Add multivitamin PO daily ? ?Thank you for involving pharmacy in this patient's care. ? ?JRenold Genta PharmD, BCPS ?Clinical Pharmacist ?Clinical phone for 02/10/2022 until  3p is [phone] ?02/10/2022 7:24 AM ? ?**Pharmacist phone directory can be found on [facility] listed under [email or web address]* ? ?

## 2022-02-11 ENCOUNTER — Telehealth: Payer: Self-pay

## 2022-02-11 DIAGNOSIS — K4130 Unilateral femoral hernia, with obstruction, without gangrene, not specified as recurrent: Secondary | ICD-10-CM | POA: Diagnosis not present

## 2022-02-11 LAB — GLUCOSE, CAPILLARY: Glucose-Capillary: 78 mg/dL (ref 70–99)

## 2022-02-11 MED ORDER — COVID-19 MRNA VACC (MODERNA) 100 MCG/0.5ML IM SUSP
0.5000 mL | Freq: Once | INTRAMUSCULAR | Status: DC
  Filled 2022-02-11: qty 0.5

## 2022-02-11 MED ORDER — QUETIAPINE FUMARATE 25 MG PO TABS: 25.0000 mg | ORAL_TABLET | Freq: Every day | ORAL | 0 refills | Status: DC

## 2022-02-11 MED ORDER — COVID-19MRNA BIVAL VAC MODERNA 50 MCG/0.5ML IM SUSP
0.5000 mL | Freq: Once | INTRAMUSCULAR | Status: DC
  Filled 2022-02-11: qty 0.5

## 2022-02-11 MED ORDER — CARBIDOPA-LEVODOPA 25-250 MG PO TABS: 1.0000 | ORAL_TABLET | Freq: Four times a day (QID) | ORAL | 0 refills | Status: AC

## 2022-02-11 MED ORDER — PANTOPRAZOLE SODIUM 40 MG PO TBEC: 40.0000 mg | DELAYED_RELEASE_TABLET | Freq: Every day | ORAL | 0 refills | Status: DC

## 2022-02-11 MED ORDER — MELATONIN 3 MG PO TABS: 3.0000 mg | ORAL_TABLET | Freq: Every day | ORAL | 0 refills | Status: AC

## 2022-02-11 NOTE — Progress Notes (Signed)
25 Days Post-Op  ? ?Subjective/Chief Complaint: ?Feels great, tol diet having flatus, not anything out of pouch ? ? ?Objective: ?Vital signs in last 24 hours: ?Temp:  [98.2 ?F (36.8 ?C)-98.4 ?F (36.9 ?C)] 98.2 ?F (36.8 ?C) (04/17 6967) ?Pulse Rate:  [88-97] 97 (04/17 0953) ?Resp:  [16-17] 17 (04/17 0953) ?BP: (98-116)/(64-73) 98/69 (04/17 8938) ?SpO2:  [99 %-100 %] 100 % (04/17 0953) ?Weight:  [47.4 kg] 47.4 kg (04/17 0500) ?Last BM Date : 02/09/22 ? ?Intake/Output from previous day: ?No intake/output data recorded. ?Intake/Output this shift: ?Total I/O ?In: 20 [I.V.:20] ?Out: -  ? ?HEENT - sclerae clear, mucous membranes moist ?Abdomen - soft nt nothing in eakins ?Neuro - alert & oriented ?  ?  ? ?Lab Results:  ?Recent Labs  ?  02/09/22 ?0306  ?WBC 8.8  ?HGB 9.0*  ?HCT 27.9*  ?PLT 463*  ? ?BMET ?Recent Labs  ?  02/09/22 ?1017  ?NA 135  ?K 4.1  ?CL 107  ?CO2 24  ?GLUCOSE 94  ?BUN 19  ?CREATININE 0.41*  ?CALCIUM 8.6*  ? ?PT/INR ?No results for input(s): LABPROT, INR in the last 72 hours. ?ABG ?No results for input(s): PHART, HCO3 in the last 72 hours. ? ?Invalid input(s): PCO2, PO2 ? ?Studies/Results: ?No results found. ? ?Anti-infectives: ?Anti-infectives (From admission, onward)  ? ? Start     Dose/Rate Route Frequency Ordered Stop  ? 01/30/22 1000  micafungin (MYCAMINE) 100 mg in sodium chloride 0.9 % 100 mL IVPB  Status:  Discontinued       ? 100 mg ?110 mL/hr over 1 Hours Intravenous Every 24 hours 01/30/22 0849 02/01/22 1145  ? 01/27/22 1115  piperacillin-tazobactam (ZOSYN) IVPB 3.375 g  Status:  Discontinued       ? 3.375 g ?12.5 mL/hr over 240 Minutes Intravenous Every 8 hours 01/27/22 1015 02/07/22 1141  ? 01/26/22 1130  cefTRIAXone (ROCEPHIN) 2 g in sodium chloride 0.9 % 100 mL IVPB  Status:  Discontinued       ? 2 g ?200 mL/hr over 30 Minutes Intravenous Every 24 hours 01/26/22 1035 01/27/22 1015  ? ?  ? ? ?Assessment/Plan: ?POD# 24 S/p emergent left femoral hernia repair with Phasix mesh/ small bowel  resection 01/17/22 - Dr. Kieth Brightly for incarcerated left femoral hernia.  Now with EC fistula through incision ?- ec fistula with no output on diet, monitor ?-I will repeat ct tomorrow prior to any additional changes ?-hopefully ready for snf by mid week ?- does not need further abx from surgical standpoint ?FEN: TPN, D2 diet ?ID: Abx completed ?VTE: SCDs, Lovenox ?  ?Urinary retention - foley out and voiding ?Acute hypoxic respiratory failure secondary to aspiration PNA - improved. Most recent CXR shows improving right pulmonary infiltrates. ?Tachycardia/A fib RVR - improved ?Parkinson's disease - per medicine, psych ?FTT in adult ?Anxiety  ?Anemia - hgb stable ?Severe protein calorie malnutrition  ?  ?  ? ?Rolm Bookbinder ?02/11/2022 ? ?

## 2022-02-11 NOTE — Progress Notes (Signed)
3005 - Assessment.  Pt refused pain Rx.  Pt requesting help with eating meals when they arrive.  Pt repositioned. ?1000 - Rx.  Pt denying pain and advising that she does not need pain Rx. ?55 - Family member arrived to visit.  Pt repositioned. ?1100 - Report given to RN.  All questions answered. ?

## 2022-02-11 NOTE — Progress Notes (Signed)
Consultation Progress Note ? ? ?Patient: [name] [name] BDZ:[id number] DOB: [birth year] DOA: 01/17/2022 ?DOS: the patient was seen and examined on 02/11/2022 ?Primary service: Edison Pace, Md, MD ? ?Brief hospital course: ?History of Parkinson's admitted with incarcerated gangrenous left femoral hernia  ?Open repair and anastomosis 3/23  ?Aspiration pneumonia, hypoxemia and intubation, improved now.   ?Persistent enterocutaneous fistula treated with TPN and now off on regular diet.  ? ?Assessment and Plan: ? ?Obstructed left-sided femoral hernia, prolonged ileus with complications.  Enterocutaneous fistula.   ?Surgically improving as per surgery.  Currently on dysphagia 2 diet.  will ask speech therapy if she is able to advance diet.  Completed antibiotic therapy. ? ?Septic shock secondary to acute respiratory failure with hypoxia (HCC) secondary to aspiration pneumonia ?Clinically improved.  Completed antibiotic therapy.  Aspiration precautions.  Respiratory therapy and chest.  Therapy. ? ?Parkinson disease ([place]) ?-Trial of increasing Sinemet to 4 times daily with last dose to be given at 10 PM.  Symptoms improved. ? ?Failure to thrive in adult ?-Severely malnourished baseline clearly predates her acute illness. BMI is < 15.  ?-Palliative have been consulted, currently plan is for aggressive measures. ?-Encourage oral supplements and regular diet. ? ?Anxiety ?Family reported hallucination with the BuSpar and no relief of anxiety with Valium. ?Patient reported good symptom control with Seroquel at night.  Discontinue Valium and BuSpar. ?Seroquel 25 mg daily at night, will prescribe on discharge.  Melatonin 3 mg at night. ? ?Continue to work with PT OT.  She can potentially go to skilled nursing facility if able to tolerate regular diet.  ?We will continue to follow up for medical management . Will prescribe medications on discharge to SNF.  ? ? ? ? ? ? ?TRH will continue to follow the patient along with you.  ? ?Subjective:  Patient seen and examined.  No overnight events.  Patient tells me that she is doing pretty well.  She tells me she ate about half of the meal and denies any nausea vomiting or abdominal pain.  No more right costochondral pain.  ? ?Physical Exam: ?Vitals:  ? 02/10/22 1951 02/11/22 0500 02/11/22 0506 02/11/22 0953  ?BP: 116/67  109/64 98/69  ?Pulse: 95  88 97  ?Resp:   16 17  ?Temp: 98.2 ?F (36.8 ?C)  98.3 ?F (36.8 ?C) 98.2 ?F (36.8 ?C)  ?TempSrc: Oral   Oral  ?SpO2: 100%   100%  ?Weight:  47.4 kg    ?Height:      ? ?General: thin and cachectic, frail, comfortable on room air. ?Alert and oriented.  Chronically sick looking but comfortable today.  Interactive.  Generalized weakness. ?Cardiovascular: S1-S2 normal.  Regular rate rhythm. ?Respiratory: Bilateral clear.  No added sounds. ?Gastrointestinal: Soft.  Nontender.  Eikens pouch left groin , no drainage ?Ext: no deformity ? ? ? ?Data Reviewed: ? ?Reviewed labs and imaging as noted above. ? ?Family Communication: daughter on the phone. I updated that she can go to SNF ? ? ?Author: ?Barb Merino, MD ?02/11/2022 11:01 AM ? ?For on call review [email or web address].  ?

## 2022-02-11 NOTE — Progress Notes (Signed)
Physical Therapy Treatment ?Patient Details ?Name: [name] [name] ?MRN: [id number] ?DOB: Sep 08, 1951 ?Today's Date: 02/11/2022 ? ? ?History of Present Illness Pt is a 71 y.o. female admitted 01/17/22 with worsening abdominal pain and vomiting; daughter reports poor intake for ~6 months due to living situation, with 20-30# weight loss. S/p L femoral hernia repair with small bowel resection secondary to gangrene 3/23. Transfer to MICU 4/1 secondary to aspiration requiring intubation; ETT 4/1-4/7. PMH includes Parkinson's disease, anxiety. ? ?  ?PT Comments  ? ? Pt admitted with above diagnosis. Pt was able to ambulate with RW today with +2 for chair follow with min assist and cues. Pt did much better today and was able to walk as she was not connected to suction. Played gospel music and pt was very happy with this ssession.  Friend present and encouraging pt. Pt currently with functional limitations due to balance and endurance deficits. Pt will benefit from skilled PT to increase their independence and safety with mobility to allow discharge to the venue listed below.      ?Recommendations for follow up therapy are one component of a multi-disciplinary discharge planning process, led by the attending physician.  Recommendations may be updated based on patient status, additional functional criteria and insurance authorization. ? ?Follow Up Recommendations ? Skilled nursing-short term rehab (<3 hours/day) ?  ?  ?Assistance Recommended at Discharge Frequent or constant Supervision/Assistance  ?Patient can return home with the following A lot of help with bathing/dressing/bathroom;Assistance with feeding;Help with stairs or ramp for entrance;Assist for transportation;A lot of help with walking and/or transfers ?  ?Equipment Recommendations ? Wheelchair cushion (measurements PT);Wheelchair (measurements PT);Hospital bed  ?  ?Recommendations for Other Services   ? ? ?  ?Precautions / Restrictions Precautions ?Precautions:  Fall;Other (comment) ?Precaution Comments: cortrak, L abdominal drain to suctionintermittently; anxious with mobility ?Restrictions ?Weight Bearing Restrictions: No  ?  ? ?Mobility ? Bed Mobility ?Overal bed mobility: Needs Assistance ?Bed Mobility: Supine to Sit ?  ?  ?Supine to sit: Min guard ?  ?  ?General bed mobility comments: Slight assist to come to EOB with elevation of trunk assist ?  ? ?Transfers ?Overall transfer level: Needs assistance ?Equipment used: Rolling walker (2 wheels) ?Transfers: Sit to/from Stand ?Sit to Stand: Min guard, From elevated surface, +2 safety/equipment ?Stand pivot transfers: Total assist ?  ?  ?  ?  ?General transfer comment: [place]  assist for trunk elevation to [facility], increased time and effort achieve fully upright posture and achieve anterior weight translation with min cues; pt sat on Stedy and was moved to the chair.  Pt able to sway hips back and forth in second stand and played gospel music that pt liked.  Stood again to [employer] for walk. ?Transfer via Lift Equipment: Stedy ? ?Ambulation/Gait ?Ambulation/Gait assistance: Min assist ?Gait Distance (Feet): 40 Feet ?Assistive device: Rolling walker (2 wheels) ?Gait Pattern/deviations: Step-to pattern, Drifts right/left, Narrow base of support ?  ?Gait velocity interpretation: <1.31 ft/sec, indicative of household ambulator ?  ?General Gait Details: Pt was able to ambulate with RW with cues to stay close to rW, assist to steer RW and some steadying assist as well.  Progressed ambulation to hallway wtih chair follow for safety. Did fatigue but great improvement. Pt takes short steps and tried to cue to take bigger steps. ? ? ?Stairs ?  ?  ?  ?  ?  ? ? ?Wheelchair Mobility ?  ? ?Modified Rankin (Stroke Patients Only) ?  ? ? ?  ?  Balance Overall balance assessment: Needs assistance ?Sitting-balance support: No upper extremity supported, Feet supported ?Sitting balance-Leahy Scale: Fair ?Sitting balance - Comments: can sit EOB  unsupported ?  ?Standing balance support: Bilateral upper extremity supported, Reliant on assistive device for balance ?Standing balance-Leahy Scale: Poor ?Standing balance comment: reliant on BUE support and external assist; posterior bias ?  ?  ?  ?  ?  ?  ?  ?  ?  ?  ?  ?  ? ?  ?Cognition Arousal/Alertness: Awake/alert ?Behavior During Therapy: [facility] for tasks assessed/performed, Flat affect ?Overall Cognitive Status: Impaired/Different from baseline ?Area of Impairment: Following commands, Problem solving ?  ?  ?  ?  ?  ?  ?  ?  ?  ?  ?  ?Following Commands: Follows one step commands with increased time ?  ?  ?Problem Solving: Slow processing, Decreased initiation, Requires verbal cues ?General Comments: pt with improved affect and interaction this session, speech quality difficult to understand; increased time to follow commands, anxious regarding mobility but responds well to encouragement and step by step instructions ?  ?  ? ?  ?Exercises General Exercises - Lower Extremity ?Ankle Circles/Pumps: AROM, Both, Supine, 5 reps ?Quad Sets: AROM, Right, Left, 10 reps, Supine ?Long Arc Quad: AROM, Both, Seated, 15 reps ?Heel Slides: AROM, Both, Supine ?Hip Flexion/Marching: AROM, Right, Left, 20 reps, Seated ? ?  ?General Comments   ?  ?  ? ?Pertinent Vitals/Pain Pain Assessment ?Pain Assessment: Faces ?Faces Pain Scale: Hurts a little bit ?Pain Location: bottom ?Pain Descriptors / Indicators: Constant ?Pain Intervention(s): Limited activity within patient's tolerance, Monitored during session, Repositioned  ? ? ?Home Living   ?  ?  ?  ?  ?  ?  ?  ?  ?  ?   ?  ?Prior Function    ?  ?  ?   ? ?PT Goals (current goals can now be found in the care plan section) Acute Rehab PT Goals ?Patient Stated Goal: agreeable to rehab ?Progress towards PT goals: Progressing toward goals ? ?  ?Frequency ? ? ? Min 3X/week ? ? ? ?  ?PT Plan Current plan remains appropriate  ? ? ?Co-evaluation   ?  ?  ?  ?  ? ?  ?AM-PAC PT "6 Clicks"  Mobility   ?Outcome Measure ? Help needed turning from your back to your side while in a flat bed without using bedrails?: A Little ?Help needed moving from lying on your back to sitting on the side of a flat bed without using bedrails?: A Little ?Help needed moving to and from a bed to a chair (including a wheelchair)?: Total ?Help needed standing up from a chair using your arms (e.g., wheelchair or bedside chair)?: A Little ?Help needed to walk in hospital room?: A Lot ?Help needed climbing 3-5 steps with a railing? : Total ?6 Click Score: 13 ? ?  ?End of Session Equipment Utilized During Treatment: Gait belt ?Activity Tolerance: Patient tolerated treatment well;Patient limited by fatigue ?Patient left: in chair;with call bell/phone within reach;with chair alarm set;with family/visitor present ?Nurse Communication: Mobility status (use Stedy to get pt back) ?PT Visit Diagnosis: Muscle weakness (generalized) (M62.81);Difficulty in walking, not elsewhere classified (R26.2) ?  ? ? ?Time: 8676-1950 ?PT Time Calculation (min) (ACUTE ONLY): 25 min ? ?Charges:  $Gait Training: 8-22 mins ?$Therapeutic Exercise: 8-22 mins          ?          ? ?  Azreal Stthomas M,PT ?Acute Rehab Services ?[phone] ?[phone] (pager)  ? ? ?Delancey Moraes F Kimberle Stanfill ?02/11/2022, 3:52 PM ? ?

## 2022-02-11 NOTE — Progress Notes (Signed)
Speech Language Pathology Treatment: Dysphagia  ?Patient Details ?Name: [name] [name] ?MRN: [id number] ?DOB: 23-Apr-1951 ?Today's Date: 02/11/2022 ?Time: 7106-2694 ?SLP Time Calculation (min) (ACUTE ONLY): 15 min ? ?Assessment / Plan / Recommendation ?Clinical Impression ? Pt's diet had been upgraded to Dys 2 from clear liquids per MD based on therapists texture recommendations once cleared from GI standpoint. Pt's implants do not fit since her surgery and she was unable to effectively masticate the solid graham cracker texture. She was independently tucking her chin with straw sips thin liquids consistently. Recommend continue Dys 2 (fine chopped/minced), thin with chin tuck, pills whole with puree. Will follow up once more for effectiveness with texture and continued use of compensatory strategy.  ?  ?HPI HPI: 71 yo female with 1 day of worsening abdominal pain and vomiting, s/p femoral hernia repair with SBR 2/2 gangrene on 3/23. Noted poor po intake for about 6 months due to living situation. Daughter reports pt with wt loss of 20-30# within past few months. SLP initially ordered as pt became acutely more dysarthric, hypophonic this admission. She was then intubated after an episode of emesis with concern for aspiration PNA. ETT 4/1-4/7. Significant PMH: Parkinson's disease (prior course of OP SLP in 2018 for dysarthria/voice, d/c after all goals were met), anxiety ?  ?   ?SLP Plan ? Continue with current plan of care ? ?  ?  ?Recommendations for follow up therapy are one component of a multi-disciplinary discharge planning process, led by the attending physician.  Recommendations may be updated based on patient status, additional functional criteria and insurance authorization. ?  ? ?Recommendations  ?Diet recommendations: Dysphagia 2 (fine chop);Thin liquid ?Liquids provided via: Cup;Straw ?Medication Administration: Whole meds with puree ?Supervision: Patient able to self feed ?Compensations: Slow rate;Small  sips/bites;Chin tuck (chin tuck with liquids) ?Postural Changes and/or Swallow Maneuvers: Seated upright 90 degrees  ?   ?    ?   ? ? ? ? Oral Care Recommendations: Oral care BID ?Follow Up Recommendations: Skilled nursing-short term rehab (<3 hours/day) ?Assistance recommended at discharge: Intermittent Supervision/Assistance ?SLP Visit Diagnosis: Dysphagia, oropharyngeal phase (R13.12) ?Plan: Continue with current plan of care ? ? ? ? ?  ?  ? ? ?Houston Siren ? ?02/11/2022, 11:19 AM ?

## 2022-02-11 NOTE — Progress Notes (Incomplete)
Pt. Refused covid-19 vac ?

## 2022-02-11 NOTE — Consult Note (Signed)
[facility] Nurse wound follow up ?Patient receiving care in [facility] 5N3. ?The small Eakin pouch to the left groin is intact, without much drainage, no signs of impending leakage or washout of barrier. Pouch not changed. ? ?I did set the patient up so she could eat her breakfast. ? ?2 small Eakin pouches are present in the room for future use. Orders for the bedside nurse are in the chart. ? ?I am signing off the [place] team. ? ?Please reconsult if additional needs are identified. ? ?[place] nurse will not follow at this time.  Please re-consult the [place] team if needed. ? ?Val Riles, RN, MSN, CWOCN, CNS-BC, pager [phone]  ?

## 2022-02-11 NOTE — Chronic Care Management (AMB) (Signed)
?  Care Management  ? ?Note ? ?02/11/2022 ?Name: MARCHETA HORSEY MRN: [id number] DOB: [birth year] ? ?ILIANI VEJAR is a 71 y.o. year old female who is a primary care patient of Vivi Barrack, MD and is actively engaged with the care management team. I reached out to [name] by phone today to assist with re-scheduling an initial visit with the Licensed Clinical Social Worker ? ?Follow up plan: ?Telephone appointment with care management team member scheduled for:02/14/2022 ? ?Noreene Larsson, RMA ?Care Guide, Embedded Care Coordination ?[facility]  [facility]  ?[place], [place] [address] ?Direct Dial: [phone] ?[email or web address] ?[email or web address]: [email or web address]   ?

## 2022-02-12 ENCOUNTER — Inpatient Hospital Stay (HOSPITAL_COMMUNITY): Payer: Medicare HMO

## 2022-02-12 DIAGNOSIS — K4130 Unilateral femoral hernia, with obstruction, without gangrene, not specified as recurrent: Secondary | ICD-10-CM | POA: Diagnosis not present

## 2022-02-12 MED ORDER — MEDIHONEY WOUND/BURN DRESSING EX PSTE
1.0000 "application " | PASTE | Freq: Every day | CUTANEOUS | Status: DC
  Administered 2022-02-12 – 2022-02-14 (×3): 1 via TOPICAL
  Filled 2022-02-12: qty 44

## 2022-02-12 MED ORDER — IOHEXOL 9 MG/ML PO SOLN
ORAL | Status: AC
  Administered 2022-02-12: 500 mL
  Filled 2022-02-12: qty 1000

## 2022-02-12 MED ORDER — IOHEXOL 300 MG/ML  SOLN
100.0000 mL | Freq: Once | INTRAMUSCULAR | Status: AC | PRN
  Administered 2022-02-12: 100 mL via INTRAVENOUS

## 2022-02-12 NOTE — Progress Notes (Signed)
?PROGRESS NOTE ? ? ? ?[name] [name]  IWL:[id number] DOB: [birth year] DOA: 01/17/2022 ?PCP: Vivi Barrack, MD  ? ? ?Brief Narrative:  ?Medical comanagement with complicated hospitalization, improving now. ? ? ?Assessment & Plan: ?  ?Incarcerated left-sided femoral hernia, prolonged ileus, enterocutaneous fistula: ?Surgery, prolonged TPN, now tolerating diet.  Repeat CT scan as per surgery today. ?Dysphagia 2 diet due to poor dentition otherwise has normal bowel function today. ? ?Septic shock: Improved. ?Aspiration pneumonia: Improved. ?Parkinson's disease: Changed Sinemet 4 times daily with improved symptoms. ?Failure to thrive, cachexia: Chronic but is stable.  Nutritional supplements to continue. ?Anxiety: Reported hallucination of the BuSpar and no relief with Valium.  Symptom control with Seroquel and melatonin.  Prescribed. ? ?Pressure Injury 01/31/22 Sacrum Stage 2 -  Partial thickness loss of dermis presenting as a shallow open injury with a red, pink wound bed without slough. (Active)  ?01/31/22 2200  ?Location: Sacrum  ?Location Orientation:   ?Staging: Stage 2 -  Partial thickness loss of dermis presenting as a shallow open injury with a red, pink wound bed without slough.  ?Wound Description (Comments):   ?Present on Admission:   ? ?  ? ?I have prescribed her Seroquel and melatonin, changes done for Parkinson's medications.  She is stable to transfer to a skilled nursing facility from medical side if surgically stable. ? ? ?DVT prophylaxis: enoxaparin (LOVENOX) injection 40 mg Start: 01/28/22 2200 ?SCDs Start: 01/17/22 2335 ? ? ?Code Status: Full code ?Family Communication: None today. ?Disposition Plan: Status is: Inpatient ?Remains inpatient appropriate because: Disposition pending ?  ? ? ?Consultants:  ?TRH ? ?Procedures:  ?Multiple procedures as above ? ?Antimicrobials:  ?Multiple antibiotics completed ? ? ?Subjective: ?Patient seen and examined.  She is in great spirits.  Denies any chest pain  shortness of breath.  Denies any nausea vomiting.  She tells me that she has been eating this much for [name] time.  Still has some loose stool, small frequency. ? ?Objective: ?Vitals:  ? 02/11/22 1423 02/11/22 2200 02/12/22 0442 02/12/22 0736  ?BP: 103/73 (!) 87/58 (!) 83/58 109/68  ?Pulse: (!) 104 100 97 93  ?Resp: '18 16 14 17  '$ ?Temp: 98 ?F (36.7 ?C) 97.7 ?F (36.5 ?C) 97.6 ?F (36.4 ?C) 97.7 ?F (36.5 ?C)  ?TempSrc: Oral Axillary Axillary Oral  ?SpO2: 100% 100% 100% 98%  ?Weight:      ?Height:      ? ? ?Intake/Output Summary (Last 24 hours) at 02/12/2022 1141 ?Last data filed at 02/11/2022 1900 ?Gross per 24 hour  ?Intake --  ?Output 15 ml  ?Net -15 ml  ? ?Filed Weights  ? 02/08/22 1043 02/09/22 0556 02/11/22 0500  ?Weight: 11.9 kg 47.7 kg 47.4 kg  ? ? ?Examination: ? ?General exam: Frail and debilitated.  Cachectic. ?Respiratory system: Clear to auscultation. Respiratory effort normal.  No added sounds. ?Cardiovascular system: S1 & S2 heard, RRR.  ?Gastrointestinal system: Soft.  Nontender.  Left inguinal Eakin's patch with no drainage.   ? ? ?Data Reviewed: I have personally reviewed following labs and imaging studies ? ?CBC: ?Recent Labs  ?Lab 02/06/22 ?0320 02/09/22 ?0306  ?WBC 13.4* 8.8  ?HGB 8.2* 9.0*  ?HCT 25.9* 27.9*  ?MCV 92.2 91.8  ?PLT 398 463*  ? ?Basic Metabolic Panel: ?Recent Labs  ?Lab 02/06/22 ?0320 02/07/22 ?0354 02/09/22 ?0306  ?NA 133* 137 135  ?K 4.1 4.0 4.1  ?CL 105 108 107  ?CO2 '22 25 24  '$ ?GLUCOSE 100* 109* 94  ?BUN 18 15  19  ?CREATININE 0.44 0.44 0.41*  ?CALCIUM 8.0* 8.4* 8.6*  ?MG  --  1.9  --   ?PHOS  --  3.2  --   ? ?GFR: ?Estimated Creatinine Clearance: 48.3 mL/min (A) (by C-G formula based on SCr of 0.41 mg/dL (L)). ?Liver Function Tests: ?Recent Labs  ?Lab 02/07/22 ?0354  ?AST 31  ?ALT 22  ?ALKPHOS 56  ?BILITOT 0.3  ?PROT 5.8*  ?ALBUMIN 1.8*  ? ?No results for input(s): LIPASE, AMYLASE in the last 168 hours. ?No results for input(s): AMMONIA in the last 168 hours. ?Coagulation Profile: ?No  results for input(s): INR, PROTIME in the last 168 hours. ?Cardiac Enzymes: ?No results for input(s): CKTOTAL, CKMB, CKMBINDEX, TROPONINI in the last 168 hours. ?BNP (last 3 results) ?No results for input(s): PROBNP in the last 8760 hours. ?HbA1C: ?No results for input(s): HGBA1C in the last 72 hours. ?CBG: ?Recent Labs  ?Lab 02/08/22 ?1502 02/08/22 ?1657 02/09/22 ?1609 02/09/22 ?2010 02/11/22 ?1150  ?GLUCAP 83 84 87 106* 78  ? ?Lipid Profile: ?No results for input(s): CHOL, HDL, LDLCALC, TRIG, CHOLHDL, LDLDIRECT in the last 72 hours. ?Thyroid Function Tests: ?No results for input(s): TSH, T4TOTAL, FREET4, T3FREE, THYROIDAB in the last 72 hours. ?Anemia Panel: ?No results for input(s): VITAMINB12, FOLATE, FERRITIN, TIBC, IRON, RETICCTPCT in the last 72 hours. ?Sepsis Labs: ?No results for input(s): PROCALCITON, LATICACIDVEN in the last 168 hours. ? ?No results found for this or any previous visit (from the past 240 hour(s)).  ? ? ? ? ? ?Radiology Studies: ?No results found. ? ? ? ? ? ?Scheduled Meds: ? acetaminophen (TYLENOL) oral liquid 160 mg/5 mL  650 mg Oral QID  ? vitamin C  500 mg Oral Daily  ? carbidopa-levodopa  1 tablet Oral QID  ? Chlorhexidine Gluconate Cloth  6 each Topical Daily  ? COVID-19 mRNA bivalent vaccine (Moderna)  0.5 mL Intramuscular Once  ? enoxaparin (LOVENOX) injection  40 mg Subcutaneous Q24H  ? feeding supplement  237 mL Oral TID BM  ? guaiFENesin-dextromethorphan  15 mL Oral BID  ? hydrocortisone cream   Topical Daily  ? lidocaine  1 patch Transdermal Q24H  ? mouth rinse  15 mL Mouth Rinse BID  ? melatonin  3 mg Oral QHS  ? multivitamin with minerals  1 tablet Oral Daily  ? pantoprazole  40 mg Oral Daily  ? QUEtiapine  25 mg Oral QHS  ? sodium chloride flush  10-40 mL Intracatheter Q12H  ? ?Continuous Infusions: ? sodium chloride Stopped (02/07/22 1522)  ? sodium chloride Stopped (02/08/22 1438)  ? ? ? LOS: 26 days  ? ? ?Time spent: 25 minutes ? ? ? ?Barb Merino, MD ?[facility] ?Pager [phone] ? ?

## 2022-02-12 NOTE — Progress Notes (Signed)
[facility] ?Progress Note ? ?26 Days Post-Op  ?Subjective: ?CC-  ?Drinking contrast for CT scan. States that she ate most of her breakfast this morning and drank an Ensure. Feeling full after contrast. Mild nausea, no emesis. BM yesterday. ?Little to no output from fistula ? ?Objective: ?Vital signs in last 24 hours: ?Temp:  [97.6 ?F (36.4 ?C)-98 ?F (36.7 ?C)] 97.7 ?F (36.5 ?C) (04/18 0736) ?Pulse Rate:  [93-104] 93 (04/18 0736) ?Resp:  [14-18] 17 (04/18 0736) ?BP: (83-109)/(58-73) 109/68 (04/18 0736) ?SpO2:  [98 %-100 %] 98 % (04/18 0736) ?Last BM Date : 02/11/22 ? ?Intake/Output from previous day: ?04/17 0701 - 04/18 0700 ?In: 20 [I.V.:20] ?Out: 15 [Drains:15] ?Intake/Output this shift: ?No intake/output data recorded. ? ?PE: ?Gen:  Alert, NAD ?Resp: normal work of breathing ?Abd: soft, nondistended, nontender, pouch over LLQ with scant purulent fluid in bag ? ?Lab Results:  ?No results for input(s): WBC, HGB, HCT, PLT in the last 72 hours. ?BMET ?No results for input(s): NA, K, CL, CO2, GLUCOSE, BUN, CREATININE, CALCIUM in the last 72 hours. ?PT/INR ?No results for input(s): LABPROT, INR in the last 72 hours. ?CMP  ?   ?Component Value Date/Time  ? NA 135 02/09/2022 0306  ? K 4.1 02/09/2022 0306  ? CL 107 02/09/2022 0306  ? CO2 24 02/09/2022 0306  ? GLUCOSE 94 02/09/2022 0306  ? BUN 19 02/09/2022 0306  ? CREATININE 0.41 (L) 02/09/2022 0306  ? CREATININE 0.79 06/03/2018 1103  ? CALCIUM 8.6 (L) 02/09/2022 0306  ? PROT 5.8 (L) 02/07/2022 0354  ? ALBUMIN 1.8 (L) 02/07/2022 0354  ? AST 31 02/07/2022 0354  ? AST 17 06/03/2018 1103  ? ALT 22 02/07/2022 0354  ? ALT 11 06/03/2018 1103  ? ALKPHOS 56 02/07/2022 0354  ? BILITOT 0.3 02/07/2022 0354  ? BILITOT 0.5 06/03/2018 1103  ? GFRNONAA >60 02/09/2022 0306  ? GFRNONAA >60 06/03/2018 1103  ? GFRAA >60 01/31/2020 2048  ? GFRAA >60 06/03/2018 1103  ? ?Lipase  ?   ?Component Value Date/Time  ? LIPASE <10 (L) 01/17/2022 1416  ? ? ? ? ? ?Studies/Results: ?No  results found. ? ?Anti-infectives: ?Anti-infectives (From admission, onward)  ? ? Start     Dose/Rate Route Frequency Ordered Stop  ? 01/30/22 1000  micafungin (MYCAMINE) 100 mg in sodium chloride 0.9 % 100 mL IVPB  Status:  Discontinued       ? 100 mg ?110 mL/hr over 1 Hours Intravenous Every 24 hours 01/30/22 0849 02/01/22 1145  ? 01/27/22 1115  piperacillin-tazobactam (ZOSYN) IVPB 3.375 g  Status:  Discontinued       ? 3.375 g ?12.5 mL/hr over 240 Minutes Intravenous Every 8 hours 01/27/22 1015 02/07/22 1141  ? 01/26/22 1130  cefTRIAXone (ROCEPHIN) 2 g in sodium chloride 0.9 % 100 mL IVPB  Status:  Discontinued       ? 2 g ?200 mL/hr over 30 Minutes Intravenous Every 24 hours 01/26/22 1035 01/27/22 1015  ? ?  ? ? ? ?Assessment/Plan ?POD# 25 S/p emergent left femoral hernia repair with Phasix mesh/ small bowel resection 01/17/22 - Dr. Kieth Brightly for incarcerated left femoral hernia.  Now with EC fistula through incision ?- ec fistula with little to no output on diet, monitor ?- Repeat CT scan today. Pending no complications patient will be ready for discharge to SNF  ? ?FEN:  D2 diet ?ID: Abx completed ?VTE: SCDs, Lovenox ?  ?Urinary retention - foley out and voiding ?Acute hypoxic respiratory failure  secondary to aspiration PNA - improved, completed abx ?Tachycardia/A fib RVR - improved ?Parkinson's disease - per medicine, psych ?FTT in adult ?Anxiety  ?Anemia - hgb stable on 4/15 ?Severe protein calorie malnutrition  ? ? LOS: 26 days  ? ? ?Wellington Hampshire, PA-C ?[facility] ?02/12/2022, 10:15 AM ?Please see Amion for pager number during day hours 7:00am-4:30pm ? ?

## 2022-02-12 NOTE — TOC Progression Note (Signed)
Transition of Care (TOC) - Progression Note  ? ? ?Patient Details  ?Name: [name] [name] ?MRN: [id number] ?Date of Birth: 02-07-1951 ? ?Transition of Care (TOC) CM/SW Contact  ?Joanne Chars, LCSW ?Phone Number: ?02/12/2022, 10:42 AM ? ?Clinical Narrative:   [facility] make bed offer, pt informed and does accept the offer.  Sharyn Lull will start insurance auth request.  ? ? ? ?Expected Discharge Plan: [place] ([facility]) ?Barriers to Discharge: Continued Medical Work up, SNF Pending bed offer ? ?Expected Discharge Plan and Services ?Expected Discharge Plan: [place] ([facility]) ?In-house Referral: Clinical Social Work ?  ?Post Acute Care Choice: [place] ?Living arrangements for the past 2 months: [place] ?                ?  ?  ?  ?  ?  ?  ?  ?  ?  ?  ? ? ?Social Determinants of Health (SDOH) Interventions ?  ? ?Readmission Risk Interventions ?   ? View : No data to display.  ?  ?  ?  ? ? ?

## 2022-02-12 NOTE — Plan of Care (Signed)
?  Problem: Clinical Measurements: ?Goal: Ability to maintain clinical measurements within normal limits will improve ?Outcome: Progressing ?Goal: Will remain free from infection ?Outcome: Progressing ?Goal: Diagnostic test results will improve ?Outcome: Progressing ?Goal: Respiratory complications will improve ?Outcome: Progressing ?Goal: Cardiovascular complication will be avoided ?Outcome: Progressing ?  ?Problem: Activity: ?Goal: Risk for activity intolerance will decrease ?Outcome: Progressing ?  ?Problem: Nutrition: ?Goal: Adequate nutrition will be maintained ?Outcome: Progressing ?  ?Problem: Coping: ?Goal: Level of anxiety will decrease ?Outcome: Progressing ?  ?Problem: Elimination: ?Goal: Will not experience complications related to bowel motility ?Outcome: Progressing ?Goal: Will not experience complications related to urinary retention ?Outcome: Progressing ?  ?Problem: Pain Managment: ?Goal: General experience of comfort will improve ?Outcome: Progressing ?  ?Problem: Safety: ?Goal: Ability to remain free from injury will improve ?Outcome: Progressing ?  ?Problem: Skin Integrity: ?Goal: Risk for impaired skin integrity will decrease ?Outcome: Progressing ?  ?Problem: Education: ?Goal: Required Educational Video(s) ?Outcome: Progressing ?  ?Problem: Clinical Measurements: ?Goal: Postoperative complications will be avoided or minimized ?Outcome: Progressing ?  ?Problem: Skin Integrity: ?Goal: Demonstration of wound healing without infection will improve ?Outcome: Progressing ?  ?

## 2022-02-12 NOTE — Progress Notes (Addendum)
0930: Patient started drinking contrast dye ?1045: CT called to inform patient has almost finished first bottle of contrast. Patient drinks slow, nausea medicine given to prevent any N&V. ?Per CT will push CT scan to 1130 and continue to have her drink. If patient gets nausea, pt can stop drinking.  ? ?1100: Patient started second contrast bottle ?

## 2022-02-12 NOTE — Progress Notes (Signed)
[place], PA paged about patients PICC being removed. Wait till CT scan has been done and resulted before removal. PA will place a note in that reflects this. ?

## 2022-02-12 NOTE — Consult Note (Signed)
WOC Nurse Consult Note: ?Patient receiving care in [facility] 5N03 ?Reason for Consult: Worsening stage 2 on coccyx ?Wound type: Evolving stage 2 on the coccyx ?Pressure Injury POA: No ?Measurement: 2.5 cm x 2 cm x 0.2 cm ?Wound bed: 100% yellow ?Drainage (amount, consistency, odor) sanguinous on sacral foam ?Periwound: intact ?Dressing procedure/placement/frequency: ?Cleanse the wound with NS. Pat dry then apply a nickel thick layer of MediHoney directly to the wound or onto a dressing. Make certain that the dressing covers the entire wound base, but not in contact with the peri-wound skin, then cover with gauze and secure with sacral foam dressing. Change dressing daily.  ? ?Order and place patient on standard size air mattress.  ? ?Monitor the wound area(s) for worsening of condition such as: ?Signs/symptoms of infection, increase in size, development of or worsening of odor, ?development of pain, or increased pain at the affected locations.   ?Notify the medical team if any of these develop. ? ?Thank you for the consult. [facility] nurse will follow weekly.  ?Please re-consult the [facility] team if needed. ? ?Cathlean Marseilles. Tamala Julian, MSN, RN, CMSRN, AGCNS, WTA ?Wound Treatment Associate ?Pager [phone]   ? ? ? ?  ? ? ?  ?

## 2022-02-12 NOTE — Progress Notes (Signed)
Occupational Therapy Treatment ?Patient Details ?Name: [name] [name] ?MRN: [id number] ?DOB: [birth year] ?Today's Date: 02/12/2022 ? ? ?History of present illness Pt is a 71 y.o. female admitted 01/17/22 with worsening abdominal pain and vomiting; daughter reports poor intake for ~6 months due to living situation, with 20-30# weight loss. S/p L femoral hernia repair with small bowel resection secondary to gangrene 3/23. Transfer to MICU 4/1 secondary to aspiration requiring intubation; ETT 4/1-4/7. PMH includes Parkinson's disease, anxiety. ?  ?OT comments ? Patient received in supine and agreeable to OT session but decline getting to EOB due to scheduled CT scan. Patient was setup for grooming in bed with HOB up and assistance with opening toothpaste only and increased time to complete. Patient instructed in AROM for BUE shoulders and strengthening with level one therapy band for BUE elbow flexion and extension. Acute OT to continue to follow.   ? ?Recommendations for follow up therapy are one component of a multi-disciplinary discharge planning process, led by the attending physician.  Recommendations may be updated based on patient status, additional functional criteria and insurance authorization. ?   ?Follow Up Recommendations ? Skilled nursing-short term rehab (<3 hours/day)  ?  ?Assistance Recommended at Discharge Frequent or constant Supervision/Assistance  ?Patient can return home with the following ? A lot of help with bathing/dressing/bathroom;A lot of help with walking and/or transfers ?  ?Equipment Recommendations ? Other (comment) (TBD)  ?  ?Recommendations for Other Services   ? ?  ?Precautions / Restrictions Precautions ?Precautions: Fall;Other (comment) ?Precaution Comments: cortrak, L abdominal drain to suctionintermittently; anxious with mobility ?Restrictions ?Weight Bearing Restrictions: No  ? ? ?  ? ?Mobility Bed Mobility ?Overal bed mobility: Needs Assistance ?Bed Mobility: Rolling ?Rolling: Min  assist ?  ?  ?  ?  ?General bed mobility comments: rolled to left to position pillow to relieve pressure off bottom ?  ? ?Transfers ?  ?  ?  ?  ?  ?  ?  ?  ?  ?  ?  ?  ?Balance   ?  ?  ?  ?  ?  ?  ?  ?  ?  ?  ?  ?  ?  ?  ?  ?  ?  ?  ?   ? ?ADL either performed or assessed with clinical judgement  ? ?ADL Overall ADL's : Needs assistance/impaired ?  ?  ?Grooming: Wash/dry hands;Wash/dry face;Oral care;Minimal assistance;Bed level ?Grooming Details (indicate cue type and reason): required assistance with opening toothpaste ?  ?  ?  ?  ?  ?  ?  ?  ?  ?  ?  ?  ?  ?  ?  ?General ADL Comments: performed grooming at bed level due to patient declining getting to EOB due to CT scan scheduled ?  ? ?Extremity/Trunk Assessment   ?  ?  ?  ?  ?  ? ?Vision   ?  ?  ?Perception   ?  ?Praxis   ?  ? ?Cognition Arousal/Alertness: Awake/alert ?Behavior During Therapy: [facility] for tasks assessed/performed, Flat affect ?Overall Cognitive Status: Impaired/Different from baseline ?Area of Impairment: Following commands, Problem solving ?  ?  ?  ?  ?  ?  ?  ?  ?  ?  ?  ?Following Commands: Follows one step commands with increased time ?  ?  ?Problem Solving: Slow processing, Decreased initiation, Requires verbal cues ?General Comments: followed directions with increased time ?  ?  ?   ?  Exercises General Exercises - Upper Extremity ?Shoulder Flexion: AROM, Both, 10 reps, Supine ?Shoulder ABduction: AROM, Both, 10 reps, Supine ?Elbow Flexion: Strengthening, 10 reps, Both, Supine, Theraband ?Theraband Level (Elbow Flexion): Level 1 (Yellow) ?Elbow Extension: Strengthening, Both, 10 reps, Supine, Theraband ?Theraband Level (Elbow Extension): Level 1 (Yellow) ? ?  ?Shoulder Instructions   ? ? ?  ?General Comments    ? ? ?Pertinent Vitals/ Pain       Pain Assessment ?Pain Assessment: Faces ?Faces Pain Scale: Hurts a little bit ?Pain Location: bottom ?Pain Descriptors / Indicators: Constant ?Pain Intervention(s): Monitored during session,  Repositioned ? ?Home Living   ?  ?  ?  ?  ?  ?  ?  ?  ?  ?  ?  ?  ?  ?  ?  ?  ?  ?  ? ?  ?Prior Functioning/Environment    ?  ?  ?  ?   ? ?Frequency ? Min 2X/week  ? ? ? ? ?  ?Progress Toward Goals ? ?OT Goals(current goals can now be found in the care plan section) ? Progress towards OT goals: Progressing toward goals ? ?Acute Rehab OT Goals ?Patient Stated Goal: get stronger ?OT Goal Formulation: With patient/family ?Time For Goal Achievement: 02/19/22 ?Potential to Achieve Goals: Good ?ADL Goals ?Pt Will Perform Grooming: with min assist;sitting ?Pt Will Perform Upper Body Bathing: with min assist;sitting ?Pt Will Perform Lower Body Bathing: with mod assist;sit to/from stand ?Pt Will Perform Upper Body Dressing: with mod assist;sitting ?Pt Will Transfer to Toilet: with mod assist;stand pivot transfer;bedside commode ?Pt Will Perform Toileting - Clothing Manipulation and hygiene: with mod assist;sitting/lateral leans  ?Plan Discharge plan remains appropriate   ? ?Co-evaluation ? ? ?   ?  ?  ?  ?  ? ?  ?AM-PAC OT "6 Clicks" Daily Activity     ?Outcome Measure ? ? Help from another person eating meals?: A Little ?Help from another person taking care of personal grooming?: A Little ?Help from another person toileting, which includes using toliet, bedpan, or urinal?: Total ?Help from another person bathing (including washing, rinsing, drying)?: A Lot ?Help from another person to put on and taking off regular upper body clothing?: A Lot ?Help from another person to put on and taking off regular lower body clothing?: Total ?6 Click Score: 12 ? ?  ?End of Session   ? ?OT Visit Diagnosis: Unsteadiness on feet (R26.81);Muscle weakness (generalized) (M62.81);Other abnormalities of gait and mobility (R26.89) ?  ?Activity Tolerance Patient tolerated treatment well ?  ?Patient Left in bed;with call bell/phone within reach;with bed alarm set ?  ?Nurse Communication Mobility status ?  ? ?   ? ?Time: 0981-1914 ?OT Time  Calculation (min): 30 min ? ?Charges: OT General Charges ?$OT Visit: 1 Visit ?OT Treatments ?$Self Care/Home Management : 8-22 mins ?$Therapeutic Exercise: 8-22 mins ? ?Lodema Hong, OTA ?Acute Rehabilitation Services  ?Pager [phone] ?Office [phone] ? ? ?[facility] ?02/12/2022, 1:10 PM ?

## 2022-02-13 DIAGNOSIS — K4130 Unilateral femoral hernia, with obstruction, without gangrene, not specified as recurrent: Secondary | ICD-10-CM | POA: Diagnosis not present

## 2022-02-13 LAB — GLUCOSE, CAPILLARY: Glucose-Capillary: 96 mg/dL (ref 70–99)

## 2022-02-13 NOTE — Progress Notes (Signed)
?PROGRESS NOTE ? ? ? ?[name] [name]  HWE:[id number] DOB: [birth year] DOA: 01/17/2022 ?PCP: Vivi Barrack, MD  ? ? ?Brief Narrative:  ?Medical comanagement with complicated hospitalization, improving now.  Waiting for skilled nursing facility bed placement. ? ? ?Assessment & Plan: ?  ?Incarcerated left-sided femoral hernia, prolonged ileus, enterocutaneous fistula: ?Surgery, prolonged TPN, now tolerating diet.  Repeat CT scan as per surgery today. ?Dysphagia 2 diet due to poor dentition otherwise has normal bowel function today. ? ?Septic shock: Improved. ?Aspiration pneumonia: Improved. ?Parkinson's disease: Changed Sinemet 4 times daily with improved symptoms. ?Failure to thrive, cachexia: Chronic but is stable.  Nutritional supplements to continue. ?Anxiety: Reported hallucination of the BuSpar and no relief with Valium.  Symptom control with Seroquel and melatonin.  Prescribed. ? ?Pressure Injury 01/31/22 Sacrum Stage 2 -  Partial thickness loss of dermis presenting as a shallow open injury with a red, pink wound bed without slough. (Active)  ?01/31/22 2200  ?Location: Sacrum  ?Location Orientation:   ?Staging: Stage 2 -  Partial thickness loss of dermis presenting as a shallow open injury with a red, pink wound bed without slough.  ?Wound Description (Comments):   ?Present on Admission:   ? ?  ? ?I have prescribed her Seroquel and melatonin, changes done for Parkinson's medications.  She is stable to transfer to a skilled nursing facility from medical side if surgically stable. ?Discussed with surgical service, will sign off as she is only pending to transfer to skilled rehab. ? ? ?DVT prophylaxis: enoxaparin (LOVENOX) injection 40 mg Start: 01/28/22 2200 ?SCDs Start: 01/17/22 2335 ? ? ?Code Status: Full code ?Family Communication: None today. ?Disposition Plan: Status is: Inpatient ?Remains inpatient appropriate because: Waiting for bed. ?  ? ? ?Consultants:  ?TRH ? ?Procedures:  ?Multiple procedures as  above ? ?Antimicrobials:  ?Multiple antibiotics completed ? ? ?Subjective: ? ?Patient seen and examined.  No overnight events.  She is very grateful that she is about to get out of the hospital after a month. ? ?Objective: ?Vitals:  ? 02/12/22 0736 02/12/22 1429 02/12/22 1939 02/13/22 1104  ?BP: 109/68 1'00/60 92/67 92/73 '$  ?Pulse: 93 88 100   ?Resp: '17 17 18 15  '$ ?Temp: 97.7 ?F (36.5 ?C) 98.2 ?F (36.8 ?C) 98.1 ?F (36.7 ?C)   ?TempSrc: Oral Oral Oral   ?SpO2: 98% 99% 98%   ?Weight:      ?Height:      ? ?No intake or output data in the 24 hours ending 02/13/22 1356 ? ?Filed Weights  ? 02/08/22 1043 02/09/22 0556 02/11/22 0500  ?Weight: 11.9 kg 47.7 kg 47.4 kg  ? ? ?Examination: ? ?General: Frail and debilitated.  Chronically sick looking.  Not in any distress.  Cachectic. ?Cardiovascular: S1-S2 normal.  Regular rate rhythm. ?Respiratory: Bilateral clear.  No added sounds. ?Gastrointestinal: Soft.  Nontender.  Left lower quadrant, small dressing applied.  Nontender.  No drainage. ? ? ? ? ?Data Reviewed: I have personally reviewed following labs and imaging studies ? ?CBC: ?Recent Labs  ?Lab 02/09/22 ?0306  ?WBC 8.8  ?HGB 9.0*  ?HCT 27.9*  ?MCV 91.8  ?PLT 463*  ? ? ?Basic Metabolic Panel: ?Recent Labs  ?Lab 02/07/22 ?0354 02/09/22 ?0306  ?NA 137 135  ?K 4.0 4.1  ?CL 108 107  ?CO2 25 24  ?GLUCOSE 109* 94  ?BUN 15 19  ?CREATININE 0.44 0.41*  ?CALCIUM 8.4* 8.6*  ?MG 1.9  --   ?PHOS 3.2  --   ? ? ?GFR: ?Estimated  Creatinine Clearance: 48.3 mL/min (A) (by C-G formula based on SCr of 0.41 mg/dL (L)). ?Liver Function Tests: ?Recent Labs  ?Lab 02/07/22 ?0354  ?AST 31  ?ALT 22  ?ALKPHOS 56  ?BILITOT 0.3  ?PROT 5.8*  ?ALBUMIN 1.8*  ? ? ?No results for input(s): LIPASE, AMYLASE in the last 168 hours. ?No results for input(s): AMMONIA in the last 168 hours. ?Coagulation Profile: ?No results for input(s): INR, PROTIME in the last 168 hours. ?Cardiac Enzymes: ?No results for input(s): CKTOTAL, CKMB, CKMBINDEX, TROPONINI in the last 168  hours. ?BNP (last 3 results) ?No results for input(s): PROBNP in the last 8760 hours. ?HbA1C: ?No results for input(s): HGBA1C in the last 72 hours. ?CBG: ?Recent Labs  ?Lab 02/08/22 ?1502 02/08/22 ?1657 02/09/22 ?1609 02/09/22 ?2010 02/11/22 ?1150  ?GLUCAP 83 84 87 106* 78  ? ? ?Lipid Profile: ?No results for input(s): CHOL, HDL, LDLCALC, TRIG, CHOLHDL, LDLDIRECT in the last 72 hours. ?Thyroid Function Tests: ?No results for input(s): TSH, T4TOTAL, FREET4, T3FREE, THYROIDAB in the last 72 hours. ?Anemia Panel: ?No results for input(s): VITAMINB12, FOLATE, FERRITIN, TIBC, IRON, RETICCTPCT in the last 72 hours. ?Sepsis Labs: ?No results for input(s): PROCALCITON, LATICACIDVEN in the last 168 hours. ? ?No results found for this or any previous visit (from the past 240 hour(s)).  ? ? ? ? ? ?Radiology Studies: ?CT ABDOMEN PELVIS W CONTRAST ? ?Result Date: 02/12/2022 ?CLINICAL DATA:  Postoperative evaluation in a 71 year old female found to have incarcerated hernia by report on previous imaging. EXAM: CT ABDOMEN AND PELVIS WITH CONTRAST TECHNIQUE: Multidetector CT imaging of the abdomen and pelvis was performed using the standard protocol following bolus administration of intravenous contrast. RADIATION DOSE REDUCTION: This exam was performed according to the departmental dose-optimization program which includes automated exposure control, adjustment of the mA and/or kV according to patient size and/or use of iterative reconstruction technique. CONTRAST:  118m OMNIPAQUE IOHEXOL 300 MG/ML  SOLN COMPARISON:  February 06, 2022. FINDINGS: Lower chest: Small RIGHT-sided pleural effusion. LEFT lower lobe scarring atelectasis. Hepatobiliary: Hepatic hemangiomata as before in both the LEFT and RIGHT hepatic lobe. Portal vein is patent. No pericholecystic stranding. No biliary duct distension. Pancreas: Normal, without mass, inflammation or ductal dilatation. Spleen: Normal. Adrenals/Urinary Tract: Adrenal glands are normal.  Symmetric renal enhancement. No hydronephrosis. No perinephric stranding. No perivesical stranding. No suspicious renal lesion. LEFT-sided renal cyst measuring up to 3.4 cm for which no follow-up is recommended. Stomach/Bowel: Contrast media within the rectum. No perirectal stranding. Scattered stool and gas throughout the colon. The appendix is normal. Ingested oral contrast media is dilute within the stomach passing into the small bowel. There are intermittent areas of small bowel dilation. Contrast has not passed into the LEFT lower quadrant adjacent to the site of hernia repair. Moderately dilated jejunal segments show contrast beyond the site of dilation and do not show discrete transition. Remaining small bowel loops are gas-filled and fluid-filled with distension of proximal ileum and or distal jejunum in the upper abdomen distension up to 3.7 cm, transition near the central abdomen though with gas-filled loops beyond this level and signs of small bowel anastomosis in the RIGHT lower quadrant. There is also a relative transition at the site of the small bowel anastomosis. At the site of LEFT femoral hernia surgery there is a 3.8 x 2.4 cm area of soft tissue density previously measuring up to 4.1 x 4.3 cm. Dressing material overlies the site along the LEFT inguinal crease. Vascular/Lymphatic: Aorta with smooth contours. IVC  with smooth contours. No aneurysmal dilation of the abdominal aorta. There is no gastrohepatic or hepatoduodenal ligament lymphadenopathy. No retroperitoneal or mesenteric lymphadenopathy. No pelvic sidewall lymphadenopathy. Reproductive: Large LEFT uterine fibroid up to 7.5 cm showing some calcification similar to prior imaging. Other: No ascites.  No free air. Musculoskeletal: Osteopenia. No acute or destructive bone process. Mild spinal degenerative changes. IMPRESSION: 1. At the site of LEFT femoral hernia surgery there is a 3.8 x 2.4 cm area of soft tissue density previously measuring  up to 4.1 x 4.3 cm. Likely resolving postoperative changes/hematoma, attention on follow-up. 2. Intermittent areas of bowel dilation likely reflecting postoperative ileus. There is relative transition in the [facility]

## 2022-02-13 NOTE — Progress Notes (Addendum)
Spoke with pt. Daughter Ellanora via phone about pending discharge concerns. Doc notified ?

## 2022-02-13 NOTE — Progress Notes (Signed)
Physical Therapy Treatment ?Patient Details ?Name: [name] [name] ?MRN: [id number] ?DOB: [birth year] ?Today's Date: 02/13/2022 ? ? ?History of Present Illness Pt is a 71 y.o. female admitted 01/17/22 with worsening abdominal pain and vomiting; daughter reports poor intake for ~6 months due to living situation, with 20-30# weight loss. S/p L femoral hernia repair with small bowel resection secondary to gangrene 3/23. Transfer to MICU 4/1 secondary to aspiration requiring intubation; ETT 4/1-4/7. PMH includes Parkinson's disease, anxiety. ? ?  ?PT Comments  ? ? Pt received supine and agreeable to session with steady progress towards acute goals. Pt able to come to sitting EOB with min assist to elevate trunk, and min assist to come to standing from elevated EOB. Pt needing assist to power up and to steady on rise. Pt able to ambulate in room with min assist and static stand with RW at window for ~5 mins without hands on assist and maintain balance. Cues throughout for posture, RW proximity and to increase step length. Pt able to correct but unable to maintain. Current plan remains appropriate to address deficits and maximize functional independence and decrease caregiver burden. Pt continues to benefit from skilled PT services to progress toward functional mobility goals.  ?  ?Recommendations for follow up therapy are one component of a multi-disciplinary discharge planning process, led by the attending physician.  Recommendations may be updated based on patient status, additional functional criteria and insurance authorization. ? ?Follow Up Recommendations ? Skilled nursing-short term rehab (<3 hours/day) ?  ?  ?Assistance Recommended at Discharge Frequent or constant Supervision/Assistance  ?Patient can return home with the following A lot of help with bathing/dressing/bathroom;Assistance with feeding;Help with stairs or ramp for entrance;Assist for transportation;A lot of help with walking and/or transfers ?   ?Equipment Recommendations ? Wheelchair cushion (measurements PT);Wheelchair (measurements PT);Hospital bed  ?  ?Recommendations for Other Services   ? ? ?  ?Precautions / Restrictions Precautions ?Precautions: Fall ?Precaution Comments: L abdominal drain to suctionintermittently; anxious with mobility ?Restrictions ?Weight Bearing Restrictions: No  ?  ? ?Mobility ? Bed Mobility ?Overal bed mobility: Needs Assistance ?Bed Mobility: Supine to Sit ?Rolling: Min assist ?  ?  ?  ?  ?General bed mobility comments: min a elevate trunk on rise ?  ? ?Transfers ?Overall transfer level: Needs assistance ?Equipment used: Rolling walker (2 wheels) ?Transfers: Sit to/from Stand ?Sit to Stand: From elevated surface, Min assist ?  ?  ?  ?  ?  ?General transfer comment: light min a to power up to standing and steady on rise from EOB ?  ? ?Ambulation/Gait ?Ambulation/Gait assistance: Min assist ?Gait Distance (Feet): 25 Feet ?Assistive device: Rolling walker (2 wheels) ?Gait Pattern/deviations: Step-to pattern, Drifts right/left, Narrow base of support ?  ?  ?  ?General Gait Details: Pt was able to ambulate with RW with cues to stay close to RW. Pt takes short steps and tried to cue to take bigger steps. ? ? ?Stairs ?  ?  ?  ?  ?  ? ? ?Wheelchair Mobility ?  ? ?Modified Rankin (Stroke Patients Only) ?  ? ? ?  ?Balance Overall balance assessment: Needs assistance ?Sitting-balance support: No upper extremity supported, Feet supported ?Sitting balance-Leahy Scale: Fair ?Sitting balance - Comments: can sit EOB unsupported ?  ?Standing balance support: Bilateral upper extremity supported, Reliant on assistive device for balance ?Standing balance-Leahy Scale: Poor ?Standing balance comment: reliant on BUE support; posterior bias ?  ?  ?  ?  ?  ?  ?  ?  ?  ?  ?  ?  ? ?  ?  Cognition Arousal/Alertness: Awake/alert ?Behavior During Therapy: [facility] for tasks assessed/performed, Flat affect ?Overall Cognitive Status: Impaired/Different from  baseline ?Area of Impairment: Following commands, Problem solving ?  ?  ?  ?  ?  ?  ?  ?  ?  ?  ?  ?Following Commands: Follows one step commands with increased time ?  ?  ?Problem Solving: Slow processing, Decreased initiation, Requires verbal cues ?General Comments: followed directions with increased time ?  ?  ? ?  ?Exercises   ? ?  ?General Comments   ?  ?  ? ?Pertinent Vitals/Pain Pain Assessment ?Pain Assessment: Faces ?Faces Pain Scale: Hurts a little bit ?Pain Location: bottom ?Pain Descriptors / Indicators: Constant ?Pain Intervention(s): Limited activity within patient's tolerance, Monitored during session, Repositioned  ? ? ?Home Living   ?  ?  ?  ?  ?  ?  ?  ?  ?  ?   ?  ?Prior Function    ?  ?  ?   ? ?PT Goals (current goals can now be found in the care plan section) Acute Rehab PT Goals ?PT Goal Formulation: With patient ?Time For Goal Achievement: 02/14/22 ? ?  ?Frequency ? ? ? Min 3X/week ? ? ? ?  ?PT Plan Current plan remains appropriate  ? ? ?Co-evaluation   ?  ?  ?  ?  ? ?  ?AM-PAC PT "6 Clicks" Mobility   ?Outcome Measure ? Help needed turning from your back to your side while in a flat bed without using bedrails?: A Little ?Help needed moving from lying on your back to sitting on the side of a flat bed without using bedrails?: A Little ?Help needed moving to and from a bed to a chair (including a wheelchair)?: Total ?Help needed standing up from a chair using your arms (e.g., wheelchair or bedside chair)?: A Little ?Help needed to walk in hospital room?: A Lot ?Help needed climbing 3-5 steps with a railing? : Total ?6 Click Score: 13 ? ?  ?End of Session Equipment Utilized During Treatment: Gait belt ?Activity Tolerance: Patient tolerated treatment well ?Patient left: in chair;with call bell/phone within reach;with chair alarm set;with family/visitor present ?Nurse Communication: Mobility status ?PT Visit Diagnosis: Muscle weakness (generalized) (M62.81);Difficulty in walking, not elsewhere  classified (R26.2) ?  ? ? ?Time: 3875-6433 ?PT Time Calculation (min) (ACUTE ONLY): 24 min ? ?Charges:  $Gait Training: 23-37 mins          ?          ? ?Audry Riles. PTA ?Acute Rehabilitation Services ?Office: [phone] ? ? ? ?Betsey Holiday Rakin Lemelle ?02/13/2022, 3:38 PM ? ?

## 2022-02-13 NOTE — Plan of Care (Signed)
?  Problem: Clinical Measurements: Goal: Cardiovascular complication will be avoided Outcome: Progressing   Problem: Activity: Goal: Risk for activity intolerance will decrease Outcome: Progressing   Problem: Nutrition: Goal: Adequate nutrition will be maintained Outcome: Progressing   Problem: Coping: Goal: Level of anxiety will decrease Outcome: Progressing   Problem: Elimination: Goal: Will not experience complications related to bowel motility Outcome: Progressing   

## 2022-02-13 NOTE — Progress Notes (Addendum)
[facility] ?Progress Note ? ?27 Days Post-Op  ?Subjective: ?CC-  ?No new complaints. Passing flatus and having bowel movements. No nausea or emesis. Minimal to no fistula output ? ?Daughter is on speaker phone during visit ? ?Objective: ?Vital signs in last 24 hours: ?Temp:  [98.1 ?F (36.7 ?C)-98.2 ?F (36.8 ?C)] 98.1 ?F (36.7 ?C) (04/18 1939) ?Pulse Rate:  [88-100] 100 (04/18 1939) ?Resp:  [17-18] 18 (04/18 1939) ?BP: (92-100)/(60-67) 92/67 (04/18 1939) ?SpO2:  [98 %-99 %] 98 % (04/18 1939) ?Last BM Date : 02/12/22 ? ?Intake/Output from previous day: ?No intake/output data recorded. ?Intake/Output this shift: ?No intake/output data recorded. ? ?PE: ?Gen:  Alert, NAD ?Resp: normal work of breathing ?Abd: soft, nondistended, nontender, dry dressing over LLQ fistula with scant fluid on dressing and very minimal drainage on inspection. No surrounding erythema or induration. ? ? ? ? ?Lab Results:  ?No results for input(s): WBC, HGB, HCT, PLT in the last 72 hours. ?BMET ?No results for input(s): NA, K, CL, CO2, GLUCOSE, BUN, CREATININE, CALCIUM in the last 72 hours. ?PT/INR ?No results for input(s): LABPROT, INR in the last 72 hours. ?CMP  ?   ?Component Value Date/Time  ? NA 135 02/09/2022 0306  ? K 4.1 02/09/2022 0306  ? CL 107 02/09/2022 0306  ? CO2 24 02/09/2022 0306  ? GLUCOSE 94 02/09/2022 0306  ? BUN 19 02/09/2022 0306  ? CREATININE 0.41 (L) 02/09/2022 0306  ? CREATININE 0.79 06/03/2018 1103  ? CALCIUM 8.6 (L) 02/09/2022 0306  ? PROT 5.8 (L) 02/07/2022 0354  ? ALBUMIN 1.8 (L) 02/07/2022 0354  ? AST 31 02/07/2022 0354  ? AST 17 06/03/2018 1103  ? ALT 22 02/07/2022 0354  ? ALT 11 06/03/2018 1103  ? ALKPHOS 56 02/07/2022 0354  ? BILITOT 0.3 02/07/2022 0354  ? BILITOT 0.5 06/03/2018 1103  ? GFRNONAA >60 02/09/2022 0306  ? GFRNONAA >60 06/03/2018 1103  ? GFRAA >60 01/31/2020 2048  ? GFRAA >60 06/03/2018 1103  ? ?Lipase  ?   ?Component Value Date/Time  ? LIPASE <10 (L) 01/17/2022 1416   ? ? ? ? ? ?Studies/Results: ?CT ABDOMEN PELVIS W CONTRAST ? ?Result Date: 02/12/2022 ?CLINICAL DATA:  Postoperative evaluation in a 71 year old female found to have incarcerated hernia by report on previous imaging. EXAM: CT ABDOMEN AND PELVIS WITH CONTRAST TECHNIQUE: Multidetector CT imaging of the abdomen and pelvis was performed using the standard protocol following bolus administration of intravenous contrast. RADIATION DOSE REDUCTION: This exam was performed according to the departmental dose-optimization program which includes automated exposure control, adjustment of the mA and/or kV according to patient size and/or use of iterative reconstruction technique. CONTRAST:  163m OMNIPAQUE IOHEXOL 300 MG/ML  SOLN COMPARISON:  February 06, 2022. FINDINGS: Lower chest: Small RIGHT-sided pleural effusion. LEFT lower lobe scarring atelectasis. Hepatobiliary: Hepatic hemangiomata as before in both the LEFT and RIGHT hepatic lobe. Portal vein is patent. No pericholecystic stranding. No biliary duct distension. Pancreas: Normal, without mass, inflammation or ductal dilatation. Spleen: Normal. Adrenals/Urinary Tract: Adrenal glands are normal. Symmetric renal enhancement. No hydronephrosis. No perinephric stranding. No perivesical stranding. No suspicious renal lesion. LEFT-sided renal cyst measuring up to 3.4 cm for which no follow-up is recommended. Stomach/Bowel: Contrast media within the rectum. No perirectal stranding. Scattered stool and gas throughout the colon. The appendix is normal. Ingested oral contrast media is dilute within the stomach passing into the small bowel. There are intermittent areas of small bowel dilation. Contrast has not passed into the LEFT lower  quadrant adjacent to the site of hernia repair. Moderately dilated jejunal segments show contrast beyond the site of dilation and do not show discrete transition. Remaining small bowel loops are gas-filled and fluid-filled with distension of proximal  ileum and or distal jejunum in the upper abdomen distension up to 3.7 cm, transition near the central abdomen though with gas-filled loops beyond this level and signs of small bowel anastomosis in the RIGHT lower quadrant. There is also a relative transition at the site of the small bowel anastomosis. At the site of LEFT femoral hernia surgery there is a 3.8 x 2.4 cm area of soft tissue density previously measuring up to 4.1 x 4.3 cm. Dressing material overlies the site along the LEFT inguinal crease. Vascular/Lymphatic: Aorta with smooth contours. IVC with smooth contours. No aneurysmal dilation of the abdominal aorta. There is no gastrohepatic or hepatoduodenal ligament lymphadenopathy. No retroperitoneal or mesenteric lymphadenopathy. No pelvic sidewall lymphadenopathy. Reproductive: Large LEFT uterine fibroid up to 7.5 cm showing some calcification similar to prior imaging. Other: No ascites.  No free air. Musculoskeletal: Osteopenia. No acute or destructive bone process. Mild spinal degenerative changes. IMPRESSION: 1. At the site of LEFT femoral hernia surgery there is a 3.8 x 2.4 cm area of soft tissue density previously measuring up to 4.1 x 4.3 cm. Likely resolving postoperative changes/hematoma, attention on follow-up. 2. Intermittent areas of bowel dilation likely reflecting postoperative ileus. There is relative transition in the RIGHT lower quadrant adjacent to small bowel anastomosis but with gas-filled loops beyond this level and with extensive stool remaining in the colon. Consider attention on follow-up with bedside small-bowel evaluation as warranted for further evaluation. 3. Small RIGHT-sided pleural effusion. 4. Hepatic hemangiomata as before. 5. Large LEFT uterine fibroid up to 7.5 cm showing some calcification similar to prior imaging. Electronically Signed   By: Zetta Bills M.D.   On: 02/12/2022 12:41   ? ?Anti-infectives: ?Anti-infectives (From admission, onward)  ? ? Start     Dose/Rate  Route Frequency Ordered Stop  ? 01/30/22 1000  micafungin (MYCAMINE) 100 mg in sodium chloride 0.9 % 100 mL IVPB  Status:  Discontinued       ? 100 mg ?110 mL/hr over 1 Hours Intravenous Every 24 hours 01/30/22 0849 02/01/22 1145  ? 01/27/22 1115  piperacillin-tazobactam (ZOSYN) IVPB 3.375 g  Status:  Discontinued       ? 3.375 g ?12.5 mL/hr over 240 Minutes Intravenous Every 8 hours 01/27/22 1015 02/07/22 1141  ? 01/26/22 1130  cefTRIAXone (ROCEPHIN) 2 g in sodium chloride 0.9 % 100 mL IVPB  Status:  Discontinued       ? 2 g ?200 mL/hr over 30 Minutes Intravenous Every 24 hours 01/26/22 1035 01/27/22 1015  ? ?  ? ? ? ?Assessment/Plan ?POD# 26 S/p emergent left femoral hernia repair with Phasix mesh/ small bowel resection 01/17/22 - Dr. Kieth Brightly for incarcerated left femoral hernia.  Now with EC fistula through incision ?- ec fistula with little to no output on diet, monitor ?- Repeat CT scan 4/18 - improving soft tissue density at hernial surgery site. Findings of post op ileus - she is having good bowel function.  ?- Eakins pouch removed yesterday due to declining output and with only scant fluid on bandage this am. Lynann Beaver can be replaced if output increases but looks to be improving and can continue dry dressing changes ?- surgically stable for dc to SNF. Daughter requests call from case management when update about insurance Josem Kaufmann is available ? ?  FEN:  D2 diet ?ID: Abx completed ?VTE: SCDs, Lovenox ?  ?Urinary retention - foley out and voiding ?Acute hypoxic respiratory failure secondary to aspiration PNA - improved, completed abx ?Tachycardia/A fib RVR - improved ?Parkinson's disease - per medicine, psych ?FTT in adult ?Anxiety  ?Anemia - hgb stable on 4/15 ?Severe protein calorie malnutrition  ? ? LOS: 27 days  ? ? ?Winferd Humphrey, PA-C ?[facility] ?02/13/2022, 10:12 AM ?Please see Amion for pager number during day hours 7:00am-4:30pm ? ?

## 2022-02-14 ENCOUNTER — Encounter: Payer: Self-pay | Admitting: Licensed Clinical Social Worker

## 2022-02-14 ENCOUNTER — Ambulatory Visit (INDEPENDENT_AMBULATORY_CARE_PROVIDER_SITE_OTHER): Payer: Medicare HMO | Admitting: Licensed Clinical Social Worker

## 2022-02-14 DIAGNOSIS — F330 Major depressive disorder, recurrent, mild: Secondary | ICD-10-CM | POA: Diagnosis not present

## 2022-02-14 DIAGNOSIS — M159 Polyosteoarthritis, unspecified: Secondary | ICD-10-CM

## 2022-02-14 DIAGNOSIS — L89150 Pressure ulcer of sacral region, unstageable: Secondary | ICD-10-CM | POA: Diagnosis not present

## 2022-02-14 DIAGNOSIS — R6521 Severe sepsis with septic shock: Secondary | ICD-10-CM | POA: Diagnosis not present

## 2022-02-14 DIAGNOSIS — K4131 Unilateral femoral hernia, with obstruction, without gangrene, recurrent: Secondary | ICD-10-CM | POA: Diagnosis not present

## 2022-02-14 DIAGNOSIS — Z79899 Other long term (current) drug therapy: Secondary | ICD-10-CM | POA: Diagnosis not present

## 2022-02-14 DIAGNOSIS — X58XXXA Exposure to other specified factors, initial encounter: Secondary | ICD-10-CM | POA: Diagnosis not present

## 2022-02-14 DIAGNOSIS — K632 Fistula of intestine: Secondary | ICD-10-CM | POA: Diagnosis not present

## 2022-02-14 DIAGNOSIS — F411 Generalized anxiety disorder: Secondary | ICD-10-CM | POA: Diagnosis not present

## 2022-02-14 DIAGNOSIS — R627 Adult failure to thrive: Secondary | ICD-10-CM | POA: Diagnosis not present

## 2022-02-14 DIAGNOSIS — R1312 Dysphagia, oropharyngeal phase: Secondary | ICD-10-CM | POA: Diagnosis not present

## 2022-02-14 DIAGNOSIS — Z139 Encounter for screening, unspecified: Secondary | ICD-10-CM

## 2022-02-14 DIAGNOSIS — R278 Other lack of coordination: Secondary | ICD-10-CM | POA: Diagnosis not present

## 2022-02-14 DIAGNOSIS — G20 Parkinson's disease: Secondary | ICD-10-CM

## 2022-02-14 DIAGNOSIS — D508 Other iron deficiency anemias: Secondary | ICD-10-CM | POA: Diagnosis not present

## 2022-02-14 DIAGNOSIS — E43 Unspecified severe protein-calorie malnutrition: Secondary | ICD-10-CM | POA: Diagnosis not present

## 2022-02-14 DIAGNOSIS — R42 Dizziness and giddiness: Secondary | ICD-10-CM | POA: Diagnosis not present

## 2022-02-14 DIAGNOSIS — M1990 Unspecified osteoarthritis, unspecified site: Secondary | ICD-10-CM | POA: Diagnosis not present

## 2022-02-14 DIAGNOSIS — M6281 Muscle weakness (generalized): Secondary | ICD-10-CM | POA: Diagnosis not present

## 2022-02-14 DIAGNOSIS — J9601 Acute respiratory failure with hypoxia: Secondary | ICD-10-CM | POA: Diagnosis not present

## 2022-02-14 DIAGNOSIS — L89153 Pressure ulcer of sacral region, stage 3: Secondary | ICD-10-CM | POA: Diagnosis not present

## 2022-02-14 DIAGNOSIS — F419 Anxiety disorder, unspecified: Secondary | ICD-10-CM | POA: Diagnosis not present

## 2022-02-14 DIAGNOSIS — T8189XA Other complications of procedures, not elsewhere classified, initial encounter: Secondary | ICD-10-CM | POA: Diagnosis not present

## 2022-02-14 DIAGNOSIS — R531 Weakness: Secondary | ICD-10-CM | POA: Diagnosis not present

## 2022-02-14 DIAGNOSIS — I10 Essential (primary) hypertension: Secondary | ICD-10-CM | POA: Diagnosis not present

## 2022-02-14 DIAGNOSIS — K460 Unspecified abdominal hernia with obstruction, without gangrene: Secondary | ICD-10-CM | POA: Diagnosis not present

## 2022-02-14 DIAGNOSIS — L89151 Pressure ulcer of sacral region, stage 1: Secondary | ICD-10-CM | POA: Diagnosis not present

## 2022-02-14 DIAGNOSIS — T8131XA Disruption of external operation (surgical) wound, not elsewhere classified, initial encounter: Secondary | ICD-10-CM | POA: Diagnosis not present

## 2022-02-14 DIAGNOSIS — R2689 Other abnormalities of gait and mobility: Secondary | ICD-10-CM | POA: Diagnosis not present

## 2022-02-14 DIAGNOSIS — M542 Cervicalgia: Secondary | ICD-10-CM | POA: Diagnosis not present

## 2022-02-14 DIAGNOSIS — J690 Pneumonitis due to inhalation of food and vomit: Secondary | ICD-10-CM | POA: Diagnosis not present

## 2022-02-14 DIAGNOSIS — Z7401 Bed confinement status: Secondary | ICD-10-CM | POA: Diagnosis not present

## 2022-02-14 LAB — CREATININE, SERUM
Creatinine, Ser: 0.53 mg/dL (ref 0.44–1.00)
GFR, Estimated: >60 mL/min (ref >60)

## 2022-02-14 LAB — GLUCOSE, CAPILLARY: Glucose-Capillary: 93 mg/dL (ref 70–99)

## 2022-02-14 MED ORDER — ENSURE ENLIVE PO LIQD: 237.0000 mL | Freq: Three times a day (TID) | ORAL | 12 refills | Status: AC

## 2022-02-14 MED ORDER — LIDOCAINE 5 % EX PTCH: 1.0000 | MEDICATED_PATCH | CUTANEOUS | 0 refills | Status: AC

## 2022-02-14 MED ORDER — MEDIHONEY WOUND/BURN DRESSING EX PSTE: 1.0000 "application " | PASTE | Freq: Every day | CUTANEOUS | Status: DC

## 2022-02-14 MED ORDER — GUAIFENESIN-DM 100-10 MG/5ML PO SYRP: 15.0000 mL | ORAL_SOLUTION | Freq: Two times a day (BID) | ORAL | 0 refills | Status: DC

## 2022-02-14 MED ORDER — HYDROCORTISONE 1 % EX CREA: TOPICAL_CREAM | Freq: Every day | CUTANEOUS | 0 refills | Status: AC

## 2022-02-14 MED ORDER — ACETAMINOPHEN 160 MG/5ML PO SOLN: 650.0000 mg | Freq: Four times a day (QID) | ORAL | 0 refills | Status: DC | PRN

## 2022-02-14 NOTE — Chronic Care Management (AMB) (Signed)
?Chronic Care Management  ? Clinical Social Work Note ? ?02/14/2022 ?Name: [name] [name] MRN: [id number] DOB: 1951/02/19 ? ?[name] [name] is a 71 y.o. year old female who is a primary care patient of [name] [name], [name] Greenhouse, MD. The CCM team was consulted to assist the patient with chronic disease management and/or care coordination needs related to: [employer] , Level of Care Concerns, and Mental Health Counseling and Resources.  ? ?Collaboration with patient's daughter [name] [name]  for follow up visit in response to provider referral for social work chronic care management and care coordination services.  ? ?Consent to Services:  ?The patient was given information about Chronic Care Management services, agreed to services, and gave verbal consent prior to initiation of services.  Please see initial visit note for detailed documentation.  ? ?Patient agreed to services and consent obtained.  ? ?Summary: Patient was accompanied by her daughter while in the hospital room, who provided information during this encounter. She is being discharged to rehab for about 3 weeks, would like information on additional support for senior housing, transportation and in-home support. Has applied for Medicaid which is currently pending .  See Care Plan below for interventions and patient self-care actives. ? ?Follow up Plan: Patient's caregiver would like continued follow-up from CCM LCSW .  per caregiver's request CCM LCSW will follow up in 1 week with patient and daughter to discuss ongoing needs.  They will call the office if needed prior to next encounter. ?  ?Assessment: Review of patient past medical history, allergies, medications, and health status, including review of relevant consultants reports was performed today as part of a comprehensive evaluation and provision of chronic care management and care coordination services.    ? ?SDOH (Social Determinants of Health) assessments and interventions performed:  ?SDOH  Interventions   ? ?Flowsheet Row Most Recent Value  ?SDOH Interventions   ?SDOH Interventions for the Following Domains Transportation  ?Housing Interventions --  [referral to care guide,  anticipated need with health decline]  ?Transportation Interventions Other (Comment)  ? ?  ?  ? ?Advanced Directives Status: Not addressed in this encounter. ? ?CCM Care Plan ?Conditions to be addressed/monitored: Anxiety; Transportation and Housing barriers ? ?Care Plan : LCSW Care Plan  ?Updates made by [name] Cane, LCSW since 02/14/2022 12:00 AM  ?  ? ?Problem: Quality of Life   ?  ? ?Goal: Quality of Life enhanced   ?Start Date: 01/17/2022  ?This Visit's Progress: On track  ?Recent Progress: On track  ?Priority: High  ?Note:   ?Current Barriers:  ?Disease Management support and education needs related to symptoms of Anxiety ?Housing  and Transportation ? ?CSW Clinical Goal(s):  ?Patient  will demonstrate a reduction in symptoms related to :symptoms of anxiety  through collaboration with Clinical Social Worker, provider, and care team.  ? ?Interventions: ?1:1 collaboration with primary care provider regarding development and update of comprehensive plan of care as evidenced by provider attestation and co-signature ?Inter-disciplinary care team collaboration (see longitudinal plan of care) ?Evaluation of current treatment plan related to  self management and patient's adherence to plan as established by provide ? ?Mental Health:  (Status: Condition stable. Not addressed this visit.) ?Evaluation of current treatment plan related to  symptoms of anxiety ?Solution-Focused Strategies employed:  ?Problem [place] strategies reviewed ? ?Social Determinants of Health in Patient with Anxiety and Parkinson Disease:  (Status: New goal.) ?SDOH assessments completed: Housing  and Transportation ?Evaluation of current treatment plan related  to SDOH ?Made referral to Care guide ?  ?Task & activities to accomplish goals: ?I have  placed a referral to the community resource care guide they will call you with resources  ?Or send via e-mail as you requested for housing and transportation resources ?  ?  ?[name] Lanius, LCSW ?Licensed Clinical Social Worker [name]  ?[name] [name] ?[phone]  ? ?

## 2022-02-14 NOTE — Discharge Summary (Signed)
[facility] ?Discharge Summary  ? ?Patient ID: ?[name] [name] ?MRN: [id number] ?DOB/AGE: 1951/06/26 71 y.o. ? ?Admit date: 01/17/2022 ?Discharge date: 02/14/2022 ? ?Admitting Diagnosis: ?incarcerated left inguinal hernia with obstruction ? ?Discharge Diagnosis ?Patient Active Problem List  ? Diagnosis Date Noted  ? Enterocutaneous fistula 02/05/2022  ? Dehydration 02/05/2022  ? Normocytic anemia 02/05/2022  ? Urinary retention 01/29/2022  ? Aspiration pneumonia ([place]) 01/28/2022  ? Septic shock ([place]) 01/28/2022  ? Septic shock secondary to acute respiratory failure with hypoxia (HCC) secondary to aspiration pneumonia 01/28/2022  ? Incarcerated hernia   ? Pressure injury of coccygeal region, stage 1 01/23/2022  ? Goals of care, counseling/discussion   ? Protein-calorie malnutrition, severe 01/20/2022  ? Pressure injury of skin 01/20/2022  ? Failure to thrive in adult 01/20/2022  ? Femoral hernia of left side with obstruction enterocutaneous fistula 01/17/2022  ? DDD (degenerative disc disease), cervical 06/18/2019  ? Constipation 03/02/2019  ? Seasonal allergies 03/02/2019  ? Anxiety 05/07/2018  ? Fatigue 04/20/2018  ? Osteoarthritis 04/20/2018  ? Palpitations 04/20/2018  ? Cardiomegaly 04/20/2018  ? Lesion of liver 04/20/2018  ? Lung nodule 04/20/2018  ? Muscle weakness of lower extremity 01/17/2015  ? Parkinson disease ([place]) 01/17/2015  ? Fibroid uterus 11/13/2012  ? Benign colonic polyp 10/02/2012  ? ? ?Consultants ?CCM ?Hospitalist ? ?Imaging: ?CT ABDOMEN PELVIS W CONTRAST ? ?Result Date: 02/12/2022 ?CLINICAL DATA:  Postoperative evaluation in a 71 year old female found to have incarcerated hernia by report on previous imaging. EXAM: CT ABDOMEN AND PELVIS WITH CONTRAST TECHNIQUE: Multidetector CT imaging of the abdomen and pelvis was performed using the standard protocol following bolus administration of intravenous contrast. RADIATION DOSE REDUCTION: This exam was performed according to the  departmental dose-optimization program which includes automated exposure control, adjustment of the mA and/or kV according to patient size and/or use of iterative reconstruction technique. CONTRAST:  170m OMNIPAQUE IOHEXOL 300 MG/ML  SOLN COMPARISON:  February 06, 2022. FINDINGS: Lower chest: Small RIGHT-sided pleural effusion. LEFT lower lobe scarring atelectasis. Hepatobiliary: Hepatic hemangiomata as before in both the LEFT and RIGHT hepatic lobe. Portal vein is patent. No pericholecystic stranding. No biliary duct distension. Pancreas: Normal, without mass, inflammation or ductal dilatation. Spleen: Normal. Adrenals/Urinary Tract: Adrenal glands are normal. Symmetric renal enhancement. No hydronephrosis. No perinephric stranding. No perivesical stranding. No suspicious renal lesion. LEFT-sided renal cyst measuring up to 3.4 cm for which no follow-up is recommended. Stomach/Bowel: Contrast media within the rectum. No perirectal stranding. Scattered stool and gas throughout the colon. The appendix is normal. Ingested oral contrast media is dilute within the stomach passing into the small bowel. There are intermittent areas of small bowel dilation. Contrast has not passed into the LEFT lower quadrant adjacent to the site of hernia repair. Moderately dilated jejunal segments show contrast beyond the site of dilation and do not show discrete transition. Remaining small bowel loops are gas-filled and fluid-filled with distension of proximal ileum and or distal jejunum in the upper abdomen distension up to 3.7 cm, transition near the central abdomen though with gas-filled loops beyond this level and signs of small bowel anastomosis in the RIGHT lower quadrant. There is also a relative transition at the site of the small bowel anastomosis. At the site of LEFT femoral hernia surgery there is a 3.8 x 2.4 cm area of soft tissue density previously measuring up to 4.1 x 4.3 cm. Dressing material overlies the site along the  LEFT inguinal crease. Vascular/Lymphatic: Aorta with smooth contours. IVC with  smooth contours. No aneurysmal dilation of the abdominal aorta. There is no gastrohepatic or hepatoduodenal ligament lymphadenopathy. No retroperitoneal or mesenteric lymphadenopathy. No pelvic sidewall lymphadenopathy. Reproductive: Large LEFT uterine fibroid up to 7.5 cm showing some calcification similar to prior imaging. Other: No ascites.  No free air. Musculoskeletal: Osteopenia. No acute or destructive bone process. Mild spinal degenerative changes. IMPRESSION: 1. At the site of LEFT femoral hernia surgery there is a 3.8 x 2.4 cm area of soft tissue density previously measuring up to 4.1 x 4.3 cm. Likely resolving postoperative changes/hematoma, attention on follow-up. 2. Intermittent areas of bowel dilation likely reflecting postoperative ileus. There is relative transition in the RIGHT lower quadrant adjacent to small bowel anastomosis but with gas-filled loops beyond this level and with extensive stool remaining in the colon. Consider attention on follow-up with bedside small-bowel evaluation as warranted for further evaluation. 3. Small RIGHT-sided pleural effusion. 4. Hepatic hemangiomata as before. 5. Large LEFT uterine fibroid up to 7.5 cm showing some calcification similar to prior imaging. Electronically Signed   By: Zetta Bills M.D.   On: 02/12/2022 12:41   ? ?Procedures ?Dr. Kieth Brightly (01/17/2022) - open femoral hernia repair with absorbable mesh, small bowel resection with anastomosis ? ?Hospital Course:  ?[name] [name] is a 71yo female who presented to [facility] 01/17/22 with 1 day of worsening abdominal pain and vomiting after 7 days of constipation.  Workup showed incarcerated left inguinal hernia with obstruction.  Patient was admitted and taken to the operating room for open femoral hernia repair with absorbable mesh, small bowel resection with anastomosis. Tolerated procedure well and was transferred to the floor.   Hospitalist service was consulted for assistance with her multiple medical problems. Patient did have a complicated postoperative course including Acute hypoxic respiratory failure secondary to aspiration pneumonia requiring intubation and antibiotics. Critical care medicine team was consulted for assistance with management. She also developed an enterocutaneous fistula through her operative incision site in the left groin. She was made NPO and started on TPN for nutrition. After several days the fistula had minimal output therefore diet was advanced as tolerated and TPN was stopped. Output from fistula remained negligible.  ?Patient worked with therapies during this admission who recommended SNF when medically stable for discharge. On 01/27/22, the patient was voiding well, tolerating dysphagia 2 diet, ambulating well, pain well controlled, vital signs stable and felt stable for discharge to SNF.  Patient will follow up as below and knows to call with questions or concerns.   ? ?I have personally reviewed the patients medication history on the [place] controlled substance database.  ?the chart. ? ?Physical Exam: ?Gen:  Alert, NAD ?Resp: normal work of breathing ?Abd: soft, nondistended, nontender, dry dressing over LLQ fistula with scant fluid on dressing and very minimal drainage on inspection, no surrounding erythema or induration. ? ?Allergies as of 02/14/2022   ? ?   Reactions  ? Sulfa Antibiotics Other (See Comments)  ? Childhood allergy   ? ?  ? ?  ?Medication List  ?  ? ?STOP taking these medications   ? ?busPIRone 5 MG tablet ?Commonly known as: BUSPAR ?  ?diazepam 2 MG tablet ?Commonly known as: Valium ?  ?ibuprofen 200 MG tablet ?Commonly known as: ADVIL ?  ?MAGNESIA PO ?  ? ?  ? ?TAKE these medications   ? ?acetaminophen 160 MG/5ML solution ?Commonly known as: TYLENOL ?Take 20.3 mLs (650 mg total) by mouth every 6 (six) hours as needed for mild pain. ?  ?  b complex vitamins capsule ?Take 1 capsule by mouth  daily. ?  ?carbidopa-levodopa 25-250 MG tablet ?Commonly known as: SINEMET IR ?Take 1 tablet by mouth 4 (four) times daily. ?What changed:  ?when to take this ?Another medication with the same name was removed. Continue t

## 2022-02-14 NOTE — Progress Notes (Signed)
Discharge summary packet/pertinent documents provided to PTAR. Pt d/c to [facility] as ordered. Pt remains alert/oriented in no apparent distress. Daughter at bedside. No complaints. ?

## 2022-02-14 NOTE — Progress Notes (Signed)
Report given to [facility] staff nurse at [facility], all questions and concerns were fully answered. ?

## 2022-02-14 NOTE — Patient Instructions (Addendum)
Visit Information ? ?Thank you for taking time for you and your daughter to speak with me today. Please don't hesitate to contact me if I can be of assistance to you before our next scheduled telephone appointment. ? ?Following are the goals we discussed today: community resources ?Task & activities to accomplish goals: ?I have placed a referral to the community resource care guide they will call you with resources  ?Or send via e-mail as you requested for housing and transportation resources ?  ?Our next appointment is by telephone on April 27th at 12:00 ? ?Please call the care guide team at [phone] if you need to cancel or reschedule your appointment.  ? ?If you are experiencing a Mental Health or [place] or need someone to talk to, please call 1-800-273-TALK (toll free, 24 hour hotline)  ? ?The patient verbalized understanding of instructions, educational materials, and care plan provided today and agreed to receive a mailed copy of patient instructions, educational materials, and care plan.  ? ?Casimer Lanius, LCSW ?Licensed Clinical Social Worker [facility] [employer]  ?[place] ?[phone]  ?

## 2022-02-14 NOTE — Progress Notes (Signed)
I have personally reviewed this encounter including the documentation in this note and have collaborated with the care management provider regarding care management and care coordination activities to include development and update of the comprehensive care plan. I am certifying that I agree with the content of this note and encounter as supervising physician.    

## 2022-02-14 NOTE — TOC Transition Note (Signed)
Transition of Care (TOC) - CM/SW Discharge Note ? ? ?Patient Details  ?Name: [name] [name] ?MRN: [id number] ?Date of Birth: 1951-06-20 ? ?Transition of Care [facility]) CM/SW Contact:  ?Joanne Chars, LCSW ?Phone Number: ?02/14/2022, 11:30 AM ? ? ?Clinical Narrative:   Pt discharging to [name]. RN call [phone] for report.  ? ? ? ?Final next level of care: [place] ?Barriers to Discharge: Barriers Resolved ? ? ?Patient Goals and CMS Choice ?Patient states their goals for this hospitalization and ongoing recovery are:: mobility ?CMS Medicare.gov Compare Post Acute Care list provided to:: Patient Represenative (must comment) ?Choice offered to / list presented to : Adult Children ? ?Discharge Placement ?  ?           ?Patient chooses bed at: [facility] ?Patient to be transferred to facility by: PT[facility] ?Name of family member notified: daugher Melvinie ?Patient and family notified of of transfer: 02/14/22 ? ?Discharge Plan and Services ?In-house Referral: Clinical Social Work ?  ?Post Acute Care Choice: [place]          ?  ?  ?  ?  ?  ?  ?  ?  ?  ?  ? ?Social Determinants of Health (SDOH) Interventions ?  ? ? ?Readmission Risk Interventions ?   ? View : No data to display.  ?  ?  ?  ? ? ? ? ? ?

## 2022-02-14 NOTE — Care Management Important Message (Signed)
Important Message ? ?Patient Details  ?Name: [name] [name] ?MRN: [id number] ?Date of Birth: [birth year] ? ? ?Medicare Important Message Given:  Yes ? ? ? ? ?Erek Kowal ?02/14/2022, 1:51 PM ?

## 2022-02-14 NOTE — Discharge Instructions (Signed)
Change dry dressing to left groin wound daily and as needed for saturation ?

## 2022-02-14 NOTE — Progress Notes (Signed)
Speech Language Pathology Treatment: Dysphagia  ?Patient Details ?Name: [name] [name] ?MRN: [id number] ?DOB: 17-Oct-1951 ?Today's Date: 02/14/2022 ?Time: 2703-5009 ?SLP Time Calculation (min) (ACUTE ONLY): 11 min ? ?Assessment / Plan / Recommendation ?Clinical Impression ? Pt stated she is being discharged today and seems to be somewhat anxious with mildly increased work of breathing. He independently stated need to tuck chin but required mod cues initially to implement fading to min-none. There were no indications of airway intrusion with thin or solids and pt able to masticate Dys 2 texture swiftly and effectively. Pt cannot upgrade from Dys 2 at this time due to implants/dentures not fitting properly. Pt set to discharge today and encouraged her to continue with chin tuck with liquids while continuing to recover and eventually she will be able to fade it out. No further ST needed.   ?HPI HPI: 71 yo female with 1 day of worsening abdominal pain and vomiting, s/p femoral hernia repair with SBR 2/2 gangrene on 3/23. Noted poor po intake for about 6 months due to living situation. Daughter reports pt with wt loss of 20-30# within past few months. SLP initially ordered as pt became acutely more dysarthric, hypophonic this admission. She was then intubated after an episode of emesis with concern for aspiration PNA. ETT 4/1-4/7. Significant PMH: Parkinson's disease (prior course of OP SLP in 2018 for dysarthria/voice, d/c after all goals were met), anxiety ?  ?   ?SLP Plan ? All goals met;Discharge SLP treatment due to (comment) ? ?  ?  ?Recommendations for follow up therapy are one component of a multi-disciplinary discharge planning process, led by the attending physician.  Recommendations may be updated based on patient status, additional functional criteria and insurance authorization. ?  ? ?Recommendations  ?Diet recommendations: Dysphagia 2 (fine chop);Thin liquid ?Liquids provided via: Cup;Straw ?Medication  Administration: Whole meds with puree ?Supervision: Patient able to self feed ?Compensations: Slow rate;Small sips/bites;Chin tuck ?Postural Changes and/or Swallow Maneuvers: Seated upright 90 degrees  ?   ?    ?   ? ? ? ? Oral Care Recommendations: Oral care BID ?Follow Up Recommendations:  (TBD) ?Assistance recommended at discharge: Intermittent Supervision/Assistance ?SLP Visit Diagnosis: Dysphagia, oropharyngeal phase (R13.12) ?Plan: All goals met;Discharge SLP treatment due to (comment) ? ? ? ? ?  ?  ? ? ?Houston Siren ? ?02/14/2022, 9:57 AM ?

## 2022-02-15 ENCOUNTER — Encounter (HOSPITAL_COMMUNITY): Payer: Self-pay | Admitting: General Surgery

## 2022-02-15 DIAGNOSIS — J690 Pneumonitis due to inhalation of food and vomit: Secondary | ICD-10-CM | POA: Diagnosis not present

## 2022-02-15 DIAGNOSIS — K632 Fistula of intestine: Secondary | ICD-10-CM | POA: Diagnosis not present

## 2022-02-15 DIAGNOSIS — G20 Parkinson's disease: Secondary | ICD-10-CM | POA: Diagnosis not present

## 2022-02-15 DIAGNOSIS — K4131 Unilateral femoral hernia, with obstruction, without gangrene, recurrent: Secondary | ICD-10-CM | POA: Diagnosis not present

## 2022-02-15 DIAGNOSIS — F411 Generalized anxiety disorder: Secondary | ICD-10-CM | POA: Diagnosis not present

## 2022-02-15 DIAGNOSIS — R6521 Severe sepsis with septic shock: Secondary | ICD-10-CM | POA: Diagnosis not present

## 2022-02-15 DIAGNOSIS — J9601 Acute respiratory failure with hypoxia: Secondary | ICD-10-CM | POA: Diagnosis not present

## 2022-02-15 DIAGNOSIS — R627 Adult failure to thrive: Secondary | ICD-10-CM | POA: Diagnosis not present

## 2022-02-18 ENCOUNTER — Telehealth: Payer: Self-pay | Admitting: *Deleted

## 2022-02-18 NOTE — Telephone Encounter (Signed)
? ?  Telephone encounter was:  Successful.  ?02/18/2022 ?Name: [name] MRN: [id number] DOB: [birth year] ? ?[name] [name] is a 71 y.o. year old female who is a primary care patient of Vivi Barrack, MD . The community resource team was consulted for assistance with patient not interested in housing information as they have fixed her patio but would like me to email the transportation information ? ?Care guide performed the following interventions: Patient provided with information about care guide support team and interviewed to confirm resource needs ?Discussed resources to assist with transportation . ? ?Follow Up Plan:  No further follow up planned at this time. The patient has been provided with needed resources. ? ?Lovett Sox -Selinda Eon ?Care Guide , Embedded Care Coordination ?[place], Care Management  ?[phone] ?[address] , [place] [address] ?Email : [email or web address] ?  ? ? ?

## 2022-02-19 DIAGNOSIS — L89150 Pressure ulcer of sacral region, unstageable: Secondary | ICD-10-CM | POA: Diagnosis not present

## 2022-02-19 DIAGNOSIS — T8189XA Other complications of procedures, not elsewhere classified, initial encounter: Secondary | ICD-10-CM | POA: Diagnosis not present

## 2022-02-20 DIAGNOSIS — J9601 Acute respiratory failure with hypoxia: Secondary | ICD-10-CM | POA: Diagnosis not present

## 2022-02-20 DIAGNOSIS — K4131 Unilateral femoral hernia, with obstruction, without gangrene, recurrent: Secondary | ICD-10-CM | POA: Diagnosis not present

## 2022-02-20 DIAGNOSIS — J690 Pneumonitis due to inhalation of food and vomit: Secondary | ICD-10-CM | POA: Diagnosis not present

## 2022-02-20 DIAGNOSIS — R627 Adult failure to thrive: Secondary | ICD-10-CM | POA: Diagnosis not present

## 2022-02-20 DIAGNOSIS — G20 Parkinson's disease: Secondary | ICD-10-CM | POA: Diagnosis not present

## 2022-02-20 DIAGNOSIS — R6521 Severe sepsis with septic shock: Secondary | ICD-10-CM | POA: Diagnosis not present

## 2022-02-20 DIAGNOSIS — K632 Fistula of intestine: Secondary | ICD-10-CM | POA: Diagnosis not present

## 2022-02-20 DIAGNOSIS — F411 Generalized anxiety disorder: Secondary | ICD-10-CM | POA: Diagnosis not present

## 2022-02-21 ENCOUNTER — Telehealth: Payer: Medicare HMO

## 2022-02-21 ENCOUNTER — Inpatient Hospital Stay: Payer: Medicare HMO | Admitting: Family Medicine

## 2022-02-21 DIAGNOSIS — J690 Pneumonitis due to inhalation of food and vomit: Secondary | ICD-10-CM | POA: Diagnosis not present

## 2022-02-21 DIAGNOSIS — R6521 Severe sepsis with septic shock: Secondary | ICD-10-CM | POA: Diagnosis not present

## 2022-02-21 DIAGNOSIS — G20 Parkinson's disease: Secondary | ICD-10-CM | POA: Diagnosis not present

## 2022-02-21 DIAGNOSIS — D508 Other iron deficiency anemias: Secondary | ICD-10-CM | POA: Diagnosis not present

## 2022-02-21 DIAGNOSIS — R627 Adult failure to thrive: Secondary | ICD-10-CM | POA: Diagnosis not present

## 2022-02-21 DIAGNOSIS — K632 Fistula of intestine: Secondary | ICD-10-CM | POA: Diagnosis not present

## 2022-02-21 DIAGNOSIS — K4131 Unilateral femoral hernia, with obstruction, without gangrene, recurrent: Secondary | ICD-10-CM | POA: Diagnosis not present

## 2022-02-21 DIAGNOSIS — F411 Generalized anxiety disorder: Secondary | ICD-10-CM | POA: Diagnosis not present

## 2022-02-21 DIAGNOSIS — J9601 Acute respiratory failure with hypoxia: Secondary | ICD-10-CM | POA: Diagnosis not present

## 2022-02-22 ENCOUNTER — Telehealth: Payer: Self-pay | Admitting: Family Medicine

## 2022-02-22 NOTE — Telephone Encounter (Signed)
Pt's daughter has requested that [facility] "follow" pt into "the home" for palliative care. ? ?[facility] would like to know if Dr. Jerline Pain wants to be supervising MD for palliative care at the residence. ? ?Please call back with information: ?[phone], option 2 ?

## 2022-02-22 NOTE — Telephone Encounter (Signed)
Yes we can sign off on her orders from palliative care. ? ?Algis Greenhouse. Jerline Pain, MD ?02/22/2022 10:14 AM  ? ?

## 2022-02-22 NOTE — Telephone Encounter (Signed)
Called and lm for [employer].  ?

## 2022-02-22 NOTE — Telephone Encounter (Signed)
See below

## 2022-02-22 NOTE — Telephone Encounter (Signed)
[facility] called back. ? ?FO Rep relayed that "Dr. Jerline Pain will sign off" ? ?[facility] representative expressed understanding and requested to be called back if there is anything else [facility] needs to tell the care provider. ?

## 2022-02-25 ENCOUNTER — Telehealth: Payer: Self-pay

## 2022-02-25 DIAGNOSIS — R627 Adult failure to thrive: Secondary | ICD-10-CM | POA: Diagnosis not present

## 2022-02-25 DIAGNOSIS — K4131 Unilateral femoral hernia, with obstruction, without gangrene, recurrent: Secondary | ICD-10-CM | POA: Diagnosis not present

## 2022-02-25 DIAGNOSIS — R6521 Severe sepsis with septic shock: Secondary | ICD-10-CM | POA: Diagnosis not present

## 2022-02-25 DIAGNOSIS — F330 Major depressive disorder, recurrent, mild: Secondary | ICD-10-CM | POA: Diagnosis not present

## 2022-02-25 DIAGNOSIS — J9601 Acute respiratory failure with hypoxia: Secondary | ICD-10-CM | POA: Diagnosis not present

## 2022-02-25 DIAGNOSIS — D508 Other iron deficiency anemias: Secondary | ICD-10-CM | POA: Diagnosis not present

## 2022-02-25 DIAGNOSIS — F411 Generalized anxiety disorder: Secondary | ICD-10-CM | POA: Diagnosis not present

## 2022-02-25 DIAGNOSIS — K632 Fistula of intestine: Secondary | ICD-10-CM | POA: Diagnosis not present

## 2022-02-25 DIAGNOSIS — I10 Essential (primary) hypertension: Secondary | ICD-10-CM | POA: Diagnosis not present

## 2022-02-25 DIAGNOSIS — G20 Parkinson's disease: Secondary | ICD-10-CM | POA: Diagnosis not present

## 2022-02-25 DIAGNOSIS — J690 Pneumonitis due to inhalation of food and vomit: Secondary | ICD-10-CM | POA: Diagnosis not present

## 2022-02-25 NOTE — Telephone Encounter (Signed)
Attempted to contact patient's daughter Threasa Beards to schedule a Palliative Care consult appointment. No answer phone rang and then hung up.  ?

## 2022-02-28 ENCOUNTER — Telehealth: Payer: Self-pay

## 2022-02-28 ENCOUNTER — Encounter: Payer: Self-pay | Admitting: Licensed Clinical Social Worker

## 2022-02-28 ENCOUNTER — Ambulatory Visit (INDEPENDENT_AMBULATORY_CARE_PROVIDER_SITE_OTHER): Payer: Medicare HMO | Admitting: Licensed Clinical Social Worker

## 2022-02-28 DIAGNOSIS — G20 Parkinson's disease: Secondary | ICD-10-CM

## 2022-02-28 DIAGNOSIS — G20A1 Parkinson's disease without dyskinesia, without mention of fluctuations: Secondary | ICD-10-CM

## 2022-02-28 DIAGNOSIS — F419 Anxiety disorder, unspecified: Secondary | ICD-10-CM

## 2022-02-28 DIAGNOSIS — M159 Polyosteoarthritis, unspecified: Secondary | ICD-10-CM

## 2022-02-28 DIAGNOSIS — M150 Primary generalized (osteo)arthritis: Secondary | ICD-10-CM

## 2022-02-28 DIAGNOSIS — R2689 Other abnormalities of gait and mobility: Secondary | ICD-10-CM | POA: Diagnosis not present

## 2022-02-28 DIAGNOSIS — R278 Other lack of coordination: Secondary | ICD-10-CM | POA: Diagnosis not present

## 2022-02-28 NOTE — Patient Instructions (Signed)
Visit Information ? ?Thank you for taking time to visit with me today. Please don't hesitate to contact me if I can be of assistance to you before our next scheduled telephone appointment. ? ?Following are the goals we discussed today:  ?Task & activities to accomplish goals: ?Call [place] for [place] program [phone] ?Contact me as soon as you are approved for Medicaid ? ?Casimer Lanius, LCSW ?Licensed Clinical Social Worker [facility] [employer]  ?[place]  ?[phone]  ? ?Our next appointment is by telephone on June 8th at 1:15 ? ?Please call the care guide team at [phone] if you need to cancel or reschedule your appointment.  ? ?If you are experiencing a Mental Health or [place] or need someone to talk to, please call 1-800-273-TALK (toll free, 24 hour hotline)  ? ?The patient verbalized understanding of instructions, educational materials, and care plan provided today and agreed to receive a mailed copy of patient instructions, educational materials, and care plan.  ? ? ?

## 2022-02-28 NOTE — Chronic Care Management (AMB) (Signed)
?Chronic Care Management  ? Clinical Social Work Note ? ?02/28/2022 ?Name: [name] [name] MRN: [id number] DOB: 08-Sep-1951 ? ?[name] [name] is a 71 y.o. year old female who is a primary care patient of Jerline Pain, Algis Greenhouse, MD. The CCM team was consulted to assist the patient with chronic disease management and/or care coordination needs related to: Level of Care Concerns.  ? ?Collaboration with patient's daughter  for follow up visit in response to provider referral for social work chronic care management and care coordination services.  ? ?Consent to Services:  ?The patient was given information about Chronic Care Management services, agreed to services, and gave verbal consent prior to initiation of services.  Please see initial visit note for detailed documentation.  ? ?Patient's daughter agreed to services and consent obtained.  ? ?Summary: Patient's daughter [name] [name] provided all information during this encounter. ?Patient remains at rehab, goal is to get her home with home support, she has applied for Medicaid ( currently pending), LCSW will assist with personal care services once Medicaid is approved  .  See Care Plan below for interventions and patient self-care actives. ? ?Recommendation: Patient may benefit from, and daughter is in agreement to start process for CAPS program with will provide additional home support if patient is approved.  ? ?Follow up Plan: Patient's caregiver would like continued follow-up from CCM LCSW .  per caregiver's request CCM LCSW will follow up in 30 days.  They will call the office if needed prior to next encounter. ?  ?Assessment: Review of patient past medical history, allergies, medications, and health status, including review of relevant consultants reports was performed today as part of a comprehensive evaluation and provision of chronic care management and care coordination services.    ? ?SDOH (Social Determinants of Health) assessments and interventions performed:    ? ?Advanced Directives Status: Not addressed in this encounter. ? ?CCM Care Plan ?Conditions to be addressed/monitored: ; Level of care concerns ? ?Care Plan : LCSW Care Plan  ?Updates made by Maurine Cane, LCSW since 02/28/2022 12:00 AM  ?  ? ?Problem: Quality of Life   ?  ? ?Goal: Quality of Life enhanced   ?Start Date: 01/17/2022  ?This Visit's Progress: On track  ?Recent Progress: On track  ?Priority: High  ?Note:   ?Current Barriers:  ?Level of Care Concerns:Inability to perform ADL's independently ? ?CSW Clinical Goal(s):  ?Patient  will demonstrate a reduction in symptoms related to :symptoms of anxiety  through collaboration with Clinical Social Worker, provider, and care team.  ? ?Interventions: ?1:1 collaboration with primary care provider regarding development and update of comprehensive plan of care as evidenced by provider attestation and co-signature ?Inter-disciplinary care team collaboration (see longitudinal plan of care) ?Evaluation of current treatment plan related to  self management and patient's adherence to plan as established by provide ? ?Level of Care Concerns in a patient with Anxiety:  (Status: New goal.) ?Current level of care: home, alone and temp at SNF ?Evaluation of patient's unmet needs in current living environment ?ADL's ?Community Alternative Program  (CAP) provided information ?Personal Care Services [facility]) :(Medicaid pending will call LCSW once approved) ?  ?Mental Health:  (Status: Condition stable. Not addressed this visit.) ?Evaluation of current treatment plan related to  symptoms of anxiety ?Solution-Focused Strategies employed:  ?Problem [place] strategies reviewed ? ?Social Determinants of Health in Patient with Anxiety and Parkinson Disease:  (Status: New goal. Goal Met. No Needs Identified this visit) ?SDOH assessments  completed: Housing  and Transportation ?Evaluation of current treatment plan related to SDOH ?Made referral to Care guide ?  ?Task &  activities to accomplish goals: ?Call [place] for [place] program [phone] ?Contact me as soon as you are approved for Medicaid ?  ?  ?Casimer Lanius, LCSW ?Licensed Clinical Social Worker [employer] [employer]  ?[place] ?[phone]  ?

## 2022-02-28 NOTE — Telephone Encounter (Signed)
Spoke with patient's daughter Threasa Beards regarding Palliative Care services. She states patient is currently at [facility]. Email sent to referral intake to advise she is not home yet. ?

## 2022-03-01 ENCOUNTER — Encounter: Payer: Self-pay | Admitting: Family Medicine

## 2022-03-01 ENCOUNTER — Ambulatory Visit (INDEPENDENT_AMBULATORY_CARE_PROVIDER_SITE_OTHER): Payer: Medicare HMO | Admitting: Family Medicine

## 2022-03-01 VITALS — BP 104/72 | HR 95 | Temp 98.0°F | Wt 101.2 lb

## 2022-03-01 DIAGNOSIS — K460 Unspecified abdominal hernia with obstruction, without gangrene: Secondary | ICD-10-CM

## 2022-03-01 DIAGNOSIS — J690 Pneumonitis due to inhalation of food and vomit: Secondary | ICD-10-CM | POA: Diagnosis not present

## 2022-03-01 DIAGNOSIS — F419 Anxiety disorder, unspecified: Secondary | ICD-10-CM | POA: Diagnosis not present

## 2022-03-01 DIAGNOSIS — M542 Cervicalgia: Secondary | ICD-10-CM | POA: Diagnosis not present

## 2022-03-01 DIAGNOSIS — R2689 Other abnormalities of gait and mobility: Secondary | ICD-10-CM | POA: Diagnosis not present

## 2022-03-01 DIAGNOSIS — R627 Adult failure to thrive: Secondary | ICD-10-CM | POA: Diagnosis not present

## 2022-03-01 DIAGNOSIS — G20 Parkinson's disease: Secondary | ICD-10-CM | POA: Diagnosis not present

## 2022-03-01 DIAGNOSIS — E43 Unspecified severe protein-calorie malnutrition: Secondary | ICD-10-CM

## 2022-03-01 DIAGNOSIS — L89151 Pressure ulcer of sacral region, stage 1: Secondary | ICD-10-CM | POA: Diagnosis not present

## 2022-03-01 DIAGNOSIS — K632 Fistula of intestine: Secondary | ICD-10-CM | POA: Diagnosis not present

## 2022-03-01 DIAGNOSIS — K4131 Unilateral femoral hernia, with obstruction, without gangrene, recurrent: Secondary | ICD-10-CM | POA: Diagnosis not present

## 2022-03-01 DIAGNOSIS — D508 Other iron deficiency anemias: Secondary | ICD-10-CM | POA: Diagnosis not present

## 2022-03-01 DIAGNOSIS — R278 Other lack of coordination: Secondary | ICD-10-CM | POA: Diagnosis not present

## 2022-03-01 DIAGNOSIS — F411 Generalized anxiety disorder: Secondary | ICD-10-CM | POA: Diagnosis not present

## 2022-03-01 MED ORDER — KETOROLAC TROMETHAMINE 60 MG/2ML IM SOLN
60.0000 mg | Freq: Once | INTRAMUSCULAR | Status: AC
  Administered 2022-03-01: 60 mg via INTRAMUSCULAR

## 2022-03-01 MED ORDER — HYDROXYZINE HCL 10 MG PO TABS: 10.0000 mg | ORAL_TABLET | Freq: Three times a day (TID) | ORAL | 3 refills | Status: DC | PRN

## 2022-03-01 NOTE — Assessment & Plan Note (Signed)
Patient is down about 16 pounds since previous to her hospitalization.  Discussed importance of high-calorie and high-protein diet. ?

## 2022-03-01 NOTE — Assessment & Plan Note (Addendum)
Still has significant mount of issues with occasional anxiety.  We will add on hydroxyzine 10 mg 3 times daily as needed.  They will follow-up with me in about a month or 2 and we can adjust the dose as needed.  We discussed potential side effects. ?

## 2022-03-01 NOTE — Assessment & Plan Note (Signed)
We will place referral to wound care center.  She is currently getting wound care at her SNF. ?

## 2022-03-01 NOTE — Assessment & Plan Note (Signed)
Slowly healing.  Not much output currently.  We will place referral to wound care center. ?

## 2022-03-01 NOTE — Progress Notes (Signed)
? ?Chronic Care Management ?Pharmacy Note ? ?03/06/2022 ?Name:  [name] [name] MRN:  [id number] DOB:  [birth year] ? ?Summary: ?Patient still in SNF.  She should be coming home next weekend and we have scheduled her meds to be delivered sometime around the end of next week. ? ?Plan: ?FU 6 months ? ? ?Subjective: ?[name] [name] is an 71 y.o. year old female who is a primary patient of Jerline Pain, Algis Greenhouse, MD.  The CCM team was consulted for assistance with disease management and care coordination needs.   ? ?Engaged with patient by telephone for follow up visit in response to provider referral for pharmacy case management and/or care coordination services.  ? ?Consent to Services:  ?The patient was given the following information about Chronic Care Management services today, agreed to services, and gave verbal consent: 1. CCM service includes personalized support from designated clinical staff supervised by the primary care provider, including individualized plan of care and coordination with other care providers 2. 24/7 contact phone numbers for assistance for urgent and routine care needs. 3. Service will only be billed when office clinical staff spend 20 minutes or more in a month to coordinate care. 4. Only one practitioner may furnish and bill the service in a calendar month. 5.The patient may stop CCM services at any time (effective at the end of the month) by phone call to the office staff. 6. The patient will be responsible for cost sharing (co-pay) of up to 20% of the service fee (after annual deductible is met). Patient agreed to services and consent obtained. ? ?Patient Care Team: ?Vivi Barrack, MD as PCP - General (Family Medicine) ?Kathrynn Ducking, MD (Inactive) as Consulting Physician (Neurology) ?Volanda Napoleon, MD as Consulting Physician (Oncology) ?Edythe Clarity, [facility] (Pharmacist) ?Maurine Cane, LCSW as Education officer, museum (Licensed Holiday representative) ? ?Recent office visits:  ?None since  last medication coordination call ?  ?Recent consult visits:  ?11/20/2021 OV (Neurology) Murriel Hopper, MD; We will try starting extended release carbidopa/levodopa to see if this can help with symptoms with less sedating side effects ?  ?Hospital visits:  ?None since last medication coordination call ? ? ?Objective: ? ?Lab Results  ?Component Value Date  ? CREATININE 0.53 02/14/2022  ? BUN 19 02/09/2022  ? GFR 87.51 09/25/2021  ? GFRNONAA >60 02/14/2022  ? GFRAA >60 01/31/2020  ? NA 135 02/09/2022  ? K 4.1 02/09/2022  ? CALCIUM 8.6 (L) 02/09/2022  ? CO2 24 02/09/2022  ? GLUCOSE 94 02/09/2022  ? ? ?Lab Results  ?Component Value Date/Time  ? HGBA1C 5.4 01/19/2022 04:36 AM  ? HGBA1C 5.8 09/25/2021 03:20 PM  ? GFR 87.51 09/25/2021 03:20 PM  ? GFR 103.67 08/05/2019 03:12 PM  ?  ?Last diabetic Eye exam: No results found for: HMDIABEYEEXA  ?Last diabetic Foot exam: No results found for: HMDIABFOOTEX  ? ?Lab Results  ?Component Value Date  ? CHOL 161 09/25/2021  ? HDL 70.30 09/25/2021  ? [place] 80 09/25/2021  ? TRIG 58 02/04/2022  ? CHOLHDL 2 09/25/2021  ? ? ? ?  Latest Ref Rng & Units 02/07/2022  ?  3:54 AM 02/04/2022  ?  4:30 AM 01/31/2022  ?  6:15 AM  ?Hepatic Function  ?Total Protein 6.5 - 8.1 g/dL 5.8   5.6   5.1    ?Albumin 3.5 - 5.0 g/dL 1.8   1.7   <1.5    ?AST 15 - 41 U/L 31   32  24    ?ALT 0 - 44 U/L _0 ?Alk Phosphatase 38 - 126 U/L 56   65   95    ?Total Bilirubin 0.3 - 1.2 mg/dL 0.3   0.6   0.6    ? ? ?Lab Results  ?Component Value Date/Time  ? TSH 0.90 09/25/2021 03:20 PM  ? TSH 0.51 08/05/2019 03:12 PM  ? ? ? ?  Latest Ref Rng & Units 02/09/2022  ?  3:06 AM 02/06/2022  ?  3:20 AM 02/05/2022  ?  4:50 AM  ?CBC  ?WBC 4.0 - 10.5 K/uL 8.8   13.4   15.3    ?Hemoglobin 12.0 - 15.0 g/dL 9.0   8.2   8.8    ?Hematocrit 36.0 - 46.0 % 27.9   25.9   27.1    ?Platelets 150 - 400 K/uL 463   398   416    ? ? ?Lab Results  ?Component Value Date/Time  ? VD25OH 37.66 08/05/2019 03:12 PM  ? ? ?Clinical ASCVD: No  ?The  10-year ASCVD risk score (Arnett DK, et al., 2019) is: 15.8% ?  Values used to calculate the score: ?    Age: 28 years ?    Sex: Female ?    Is Non-Hispanic African American: Yes ?    Diabetic: No ?    Tobacco smoker: Yes ?    Systolic Blood Pressure: 858 mmHg ?    Is BP treated: No ?    HDL Cholesterol: 70.3 mg/dL ?    Total Cholesterol: 161 mg/dL   ? ? ?  12/14/2021  ?  3:09 PM 08/23/2021  ?  3:35 PM 08/17/2021  ?  3:28 PM  ?Depression screen PHQ 2/9  ?Decreased Interest 0 0 0  ?Down, Depressed, Hopeless 0 0 0  ?PHQ - 2 Score 0 0 0  ?  ? ?Social History  ? ?Tobacco Use  ?Smoking Status Never  ?Smokeless Tobacco Never  ? ?BP Readings from Last 3 Encounters:  ?03/01/22 104/72  ?02/14/22 (!) 90/55  ?09/25/21 107/73  ? ?Pulse Readings from Last 3 Encounters:  ?03/01/22 95  ?02/14/22 82  ?09/25/21 95  ? ?Wt Readings from Last 3 Encounters:  ?03/01/22 101 lb 3.2 oz (45.9 kg)  ?02/11/22 104 lb 8 oz (47.4 kg)  ?09/25/21 117 lb (53.1 kg)  ? ?BMI Readings from Last 3 Encounters:  ?03/01/22 15.85 kg/m?  ?02/11/22 16.37 kg/m?  ?09/25/21 18.32 kg/m?  ? ? ?Assessment/Interventions: Review of patient past medical history, allergies, medications, health status, including review of consultants reports, laboratory and other test data, was performed as part of comprehensive evaluation and provision of chronic care management services.  ? ?SDOH:  (Social Determinants of Health) assessments and interventions performed: Yes ? ?Financial Resource Strain: Low Risk   ? Difficulty of Paying Living Expenses: Not hard at all  ? ? ?SDOH Screenings  ? ?Alcohol Screen: Not on file  ?Depression (PHQ2-9): Low Risk   ? PHQ-2 Score: 0  ?Financial Resource Strain: Low Risk   ? Difficulty of Paying Living Expenses: Not hard at all  ?Food Insecurity: No Food Insecurity  ? Worried About Charity fundraiser in the Last Year: Never true  ? Ran Out of Food in the Last Year: Never true  ?Housing: Low Risk   ? Last Housing Risk Score: 0  ?Physical Activity:  Unknown  ? Days of Exercise per Week: 0 days  ? Minutes  of Exercise per Session: Not on file  ?Social Connections: Moderately Isolated  ? Frequency of Communication with Friends and Family: More than three times a week  ? Frequency of Social Gatherings with Friends and Family: Twice a week  ? Attends Religious Services: More than 4 times per year  ? Active Member of Clubs or Organizations: No  ? Attends Archivist Meetings: Never  ? Marital Status: Divorced  ?Stress: No Stress Concern Present  ? Feeling of Stress : Not at all  ?Tobacco Use: Low Risk   ? Smoking Tobacco Use: Never  ? Smokeless Tobacco Use: Never  ? Passive Exposure: Not on file  ?Transportation Needs: No Transportation Needs  ? Lack of Transportation (Medical): No  ? Lack of Transportation (Non-Medical): No  ? ? ?CCM Care Plan ? ?Allergies  ?Allergen Reactions  ? Sulfa Antibiotics Other (See Comments)  ?  Childhood allergy   ? ? ?Medications Reviewed Today   ? ? Reviewed by Edythe Clarity, RPH (Pharmacist) on 03/06/22 at 1014  Med List Status: <None>  ? ?Medication Order Taking? Sig Documenting Provider Last Dose Status Informant  ?acetaminophen (TYLENOL) 160 MG/5ML solution [id number] Yes Take 20.3 mLs (650 mg total) by mouth every 6 (six) hours as needed for mild pain. Meuth, Blaine Hamper, PA-C Taking Active   ? Patient not taking:   Discontinued 02/01/20 0633   ?Ascorbic Acid (VITAMIN C PO) [id number] Yes Take 1 tablet by mouth daily. [provider] Taking Active Self, Child  ?b complex vitamins capsule [id number] Yes Take 1 capsule by mouth daily. [provider] Taking Active Self, Child  ?carbidopa-levodopa (SINEMET IR) 25-250 MG tablet [id number] Yes Take 1 tablet by mouth 4 (four) times daily. Barb Merino, MD Taking Active   ?Cholecalciferol (VITAMIN D) 2000 UNITS CAPS [id number] Yes Take 2,000 Units by mouth daily. [provider] Taking Active Self, Child  ?feeding supplement (ENSURE ENLIVE / ENSURE PLUS)  LIQD [id number] Yes Take 237 mLs by mouth 3 (three) times daily between meals. Wellington Hampshire, PA-C Taking Active   ?glucosamine-chondroitin 500-400 MG tablet [id number] Yes Take 1 tablet by mouth daily

## 2022-03-01 NOTE — Patient Instructions (Addendum)
It was very nice to see you today! ? ?I am glad that you are recovering. ? ?I will refer you to home health for when you get out of the rehab facility ? ?I will also refer you to the wound care clinic as well.  ? ?We will give you an injection for your neck pain today.  ? ?Please come back to see me in 1 to 2 months. ? ?Take care, ?Dr Jerline Pain ? ?PLEASE NOTE: ? ?If you had any lab tests please let us know if you have not heard back within a few days. You may see your results on mychart before we have a chance to review them but we will give you a call once they are reviewed by [place]. If we ordered any referrals today, please let us know if you have not heard from their office within the next week.  ? ?Please try these tips to maintain a healthy lifestyle: ? ?Eat at least 3 REAL meals and 1-2 snacks per day.  Aim for no more than 5 hours between eating.  If you eat breakfast, please do so within one hour of getting up.  ? ?Each meal should contain half fruits/vegetables, one quarter protein, and one quarter carbs (no bigger than a computer mouse) ? ?Cut down on sweet beverages. This includes juice, soda, and sweet tea.  ? ?Drink at least 1 glass of water with each meal and aim for at least 8 glasses per day ? ?Exercise at least 150 minutes every week.   ?

## 2022-03-01 NOTE — Progress Notes (Signed)
? ?[name] [name] is a 71 y.o. female who presents today for an office visit. ? ?Assessment/Plan:  ?New/Acute Problems: ?Aspiration Pneumonia ?Resolved.  Reassuring lung exam today. She will continue to work with speech therapy and respiratory therapy.  ? ?Incarcerated Hernia ?Surgical site is healing normally. No red flag signs or symptoms.  ? ?Neck Pain ?No red flags. Likely muscular strain due to positioning while in the hospital.  Reassuring exam today.  Recent renal function within normal limits.  Give 60 mg of Toradol today.  She can continue to work with physical therapy as well. ? ?Chronic Problems Addressed Today: ?Parkinson disease ([place]) ?She is doing better on current regimen of sinemet 25-250 four times daily. She will follow up with her neurologist soon.  ? ?Enterocutaneous fistula ?Slowly healing.  Not much output currently.  We will place referral to wound care center. ? ?Pressure injury of coccygeal region, stage 1 ?We will place referral to wound care center.  She is currently getting wound care at her SNF. ? ?Protein-calorie malnutrition, severe ?Patient is down about 16 pounds since previous to her hospitalization.  Discussed importance of high-calorie and high-protein diet. ? ?Anxiety ?Still has significant mount of issues with occasional anxiety.  We will add on hydroxyzine 10 mg 3 times daily as needed.  They will follow-up with me in about a month or 2 and we can adjust the dose as needed.  We discussed potential side effects. ? ? ?  ?Subjective:  ?HPI: ? ?Patient is here today for hospital follow-up.  She presented to the ED on 01/17/2022 with severe abdominal pain.  CT showed incarcerated left inguinal hernia with obstruction.  She was taken in for surgery and underwent open femoral hernia repair with possible mesh as well as a small bowel resection with anastomosis.  She tolerated procedure well and was transferred to the floor.  Her postoperative course was complicated by an episode of  aspiration pneumonia resulting in respiratory failure requiring intubation and antibiotics for several days. She also developed an enterocutaneous fistula at the site of her operative incision.  She was started on TPN.  After several days of TPN output from her fistula stopped and she was resumed on oral diet.  She worked with physical therapy and Occupational Therapy while hospitalized and she was discharged to SNF on 02/14/2022. ? ?She is currently still at [facility]. She is recovering steadily but still needs significant assistance. She has been working with wound care for her residual wounds from where she had the fistula as well as a sacral pressure injury. She has been working with PT and OT as well as speech therapy which is going well. She will probably be at the rehab facility for another 1-2 weeks.  ? ?She has had worsening neck pain and stifness. She thinks is due to positioning while in the hospital. She has had toradol injections for similar neck spasms and pain in the past which worked well.  ? ? ?   ?  ?Objective:  ?Physical Exam: ?BP 104/72 (BP Location: Left Arm)   Pulse 95   Temp 98 ?F (36.7 ?C) (Temporal)   Wt 101 lb 3.2 oz (45.9 kg)   SpO2 98%   BMI 15.85 kg/m?   ?Wt Readings from Last 3 Encounters:  ?03/01/22 101 lb 3.2 oz (45.9 kg)  ?02/11/22 104 lb 8 oz (47.4 kg)  ?09/25/21 117 lb (53.1 kg)  ? ? ?Gen: No acute distress, resting comfortably ?CV: Regular rate and rhythm with  no murmurs appreciated ?Pulm: Normal work of breathing, clear to auscultation bilaterally with no crackles, wheezes, or rhonchi ?Neuro: Grossly normal, moves all extremities ?Psych: Normal affect and thought content ? ?Time Spent: ?65 minutes of total time was spent on the date of the encounter performing the following actions: chart review prior to seeing the patient including recent hospitalization, obtaining history, performing a medically necessary exam, counseling on the treatment plan, placing orders, and documenting in  our EHR.  ? ? ?   ? ?Algis Greenhouse. Jerline Pain, MD ?03/01/2022 3:12 PM  ?

## 2022-03-01 NOTE — Assessment & Plan Note (Signed)
She is doing better on current regimen of sinemet 25-250 four times daily. She will follow up with her neurologist soon.  ?

## 2022-03-04 DIAGNOSIS — F411 Generalized anxiety disorder: Secondary | ICD-10-CM | POA: Diagnosis not present

## 2022-03-04 DIAGNOSIS — F330 Major depressive disorder, recurrent, mild: Secondary | ICD-10-CM | POA: Diagnosis not present

## 2022-03-05 ENCOUNTER — Ambulatory Visit: Payer: Medicare HMO | Admitting: Pharmacist

## 2022-03-05 DIAGNOSIS — R278 Other lack of coordination: Secondary | ICD-10-CM | POA: Diagnosis not present

## 2022-03-05 DIAGNOSIS — G20 Parkinson's disease: Secondary | ICD-10-CM | POA: Diagnosis not present

## 2022-03-05 DIAGNOSIS — R2689 Other abnormalities of gait and mobility: Secondary | ICD-10-CM | POA: Diagnosis not present

## 2022-03-05 DIAGNOSIS — M159 Polyosteoarthritis, unspecified: Secondary | ICD-10-CM

## 2022-03-05 DIAGNOSIS — T8189XA Other complications of procedures, not elsewhere classified, initial encounter: Secondary | ICD-10-CM | POA: Diagnosis not present

## 2022-03-05 DIAGNOSIS — F419 Anxiety disorder, unspecified: Secondary | ICD-10-CM

## 2022-03-05 DIAGNOSIS — R42 Dizziness and giddiness: Secondary | ICD-10-CM | POA: Diagnosis not present

## 2022-03-05 DIAGNOSIS — J302 Other seasonal allergic rhinitis: Secondary | ICD-10-CM

## 2022-03-05 DIAGNOSIS — L89150 Pressure ulcer of sacral region, unstageable: Secondary | ICD-10-CM | POA: Diagnosis not present

## 2022-03-06 ENCOUNTER — Telehealth: Payer: Self-pay | Admitting: Family Medicine

## 2022-03-06 DIAGNOSIS — G20 Parkinson's disease: Secondary | ICD-10-CM | POA: Diagnosis not present

## 2022-03-06 DIAGNOSIS — R278 Other lack of coordination: Secondary | ICD-10-CM | POA: Diagnosis not present

## 2022-03-06 DIAGNOSIS — F411 Generalized anxiety disorder: Secondary | ICD-10-CM | POA: Diagnosis not present

## 2022-03-06 DIAGNOSIS — R627 Adult failure to thrive: Secondary | ICD-10-CM | POA: Diagnosis not present

## 2022-03-06 DIAGNOSIS — D508 Other iron deficiency anemias: Secondary | ICD-10-CM | POA: Diagnosis not present

## 2022-03-06 DIAGNOSIS — R2689 Other abnormalities of gait and mobility: Secondary | ICD-10-CM | POA: Diagnosis not present

## 2022-03-06 DIAGNOSIS — K4131 Unilateral femoral hernia, with obstruction, without gangrene, recurrent: Secondary | ICD-10-CM | POA: Diagnosis not present

## 2022-03-06 DIAGNOSIS — Z79899 Other long term (current) drug therapy: Secondary | ICD-10-CM | POA: Diagnosis not present

## 2022-03-06 DIAGNOSIS — K632 Fistula of intestine: Secondary | ICD-10-CM | POA: Diagnosis not present

## 2022-03-06 NOTE — Patient Instructions (Addendum)
Visit Information ? ? Goals Addressed   ? ?  ?  ?  ?  ? This Visit's Progress  ?  Manage My Medicine   On track  ?  Timeframe:  Long-Range Goal ?Priority:  High ?Start Date:    06/19/21                         ?Expected End Date:   12/20/21                   ? ?Follow Up Date 09/26/21  ?  ?- call for medicine refill 2 or 3 days before it runs out ?- keep a list of all the medicines I take; vitamins and herbals too ?- use a pillbox to sort medicine  ?  ?Why is this important?   ?These steps will help you keep on track with your medicines. ?  ?Notes:  ?  ? ?  ? ?Patient Care Plan: General Pharmacy (Adult)  ?  ? ?Problem Identified: Parkinson's Disease, Anxiety, Constipation, Allergies   ?Priority: High  ?Onset Date: 06/19/2021  ?  ? ?Long-Range Goal: Patient-Specific Goal   ?Start Date: 06/19/2021  ?Expected End Date: 12/20/2021  ?Recent Progress: On track  ?Priority: High  ?Note:   ?Current Barriers:  ?Currently in SNF ? ?Pharmacist Clinical Goal(s):  ?Patient will work to make medication delivery and pickup easier through collaboration with PharmD and provider.  ? ?Interventions: ?1:1 collaboration with Vivi Barrack, MD regarding development and update of comprehensive plan of care as evidenced by provider attestation and co-signature ?Inter-disciplinary care team collaboration (see longitudinal plan of care) ?Comprehensive medication review performed; medication list updated in electronic medical record ? ?Depression/Anxiety (Goal: Minimize symptoms) ?-Controlled ?-Current treatment: ?Hydroxyzine 1m tid prn Appropriate, Effective, Safe, Accessible ?-Medications previously tried/failed: citalopram ?-PHQ9:  [place] ?PH[place]-9 Total Score 5  ? ?  ? ?Update 03/05/22 ?No longer taking Lexampro, Valium, or Seroquel. ?Doing well on as needed Atarax at the moment. ?She has plans to continue this post release home. ?Will continue to follow and assess  for further needs. ?No changes at this time. ? ? ?-GAD7:  ?GAD 7 : Generalized Anxiety Score 09/17/2018 05/07/2018  ?Nervous, Anxious, on Edge 1 3  ?Control/stop worrying 0 1  ?Worry too much - different things 0 0  ?Trouble relaxing 1 3  ?Restless 0 1  ?Easily annoyed or irritable 0 0  ?Afraid - awful might happen 0 0  ?Total GAD 7 Score 2 8  ?Anxiety Difficulty Somewhat difficult Very difficult  ?-Educated on Benefits of medication for symptom control ?-Patient taking medication at night and having a difficult time sleeping longer than 4-5 hours. ?-Recommended to continue current medication ?Trial taking medication in the morning to see if this helps with sleep concern. ? ?Parkinson's Disease (Goal: Control symptoms) ?-Controlled ?-Current treatment  ?Carbidopa-levodopa 25-250 mg four times per day Appropriate, Effective, Safe, Accessible ?-Medications previously tried: none noted ?-Only takes twice daily because it makes her sleepy  ?-She is able to take care of herself, perform ADL's still ?-Recommended to continue current medication ? ?Update 03/05/22 ?Stable on current dose of Carb/levo. ?Daughter reports she is doing better, plans to come home next weekend. ?Currently getting all meds from SNF. ?Will set up to have meds delivered when she returns home. ?No changes will FU at discharge to ensure correct dose of all meds are delivered. ? ?  Constipation (Goal: Regular bowel movements) ?-Controlled ?-Current treatment  ?Colace 156m prn ?-Medications previously tried: Amitiza (too strong) ?-Bowels are normal at this time  ?-Recommended to continue current medication ? ?Allergies (Goal: Minimize symptoms) ?-Controlled ?-Current treatment  ?Fluticasone 569m prn ?-Medications previously tried: none noted ?-Does not have to use very often, allergy symptoms have been controlled lately  ?-Recommended to continue current medication ? ?Osteoarthritis (Goal: Control symptoms) ?-Controlled ?-Current treatment   ?None ?-Medications previously tried: none noted ?-Pain level remains the same - no changes at this time  ?-Recommended to continue current medication ? ?Patient Goals/Self-Care Activities ?Patient will:  ?- take medications as prescribed ?focus on medication adherence by pill counts ?target a minimum of 150 minutes of moderate intensity exercise weekly ? ?Follow Up Plan: The care management team will reach out to the patient again over the next 180 days.  ? ? ?  ? ?  ? ?Patient Care Plan: LCSW Care Plan  ?  ? ?Problem Identified: Quality of Life   ?  ? ?Goal: Quality of Life enhanced   ?Start Date: 01/17/2022  ?This Visit's Progress: On track  ?Recent Progress: On track  ?Priority: High  ?Note:   ?Current Barriers:  ?Level of Care Concerns:Inability to perform ADL's independently ? ?CSW Clinical Goal(s):  ?Patient  will demonstrate a reduction in symptoms related to :symptoms of anxiety  through collaboration with Clinical Social Worker, provider, and care team.  ? ?Interventions: ?1:1 collaboration with primary care provider regarding development and update of comprehensive plan of care as evidenced by provider attestation and co-signature ?Inter-disciplinary care team collaboration (see longitudinal plan of care) ?Evaluation of current treatment plan related to  self management and patient's adherence to plan as established by provide ? ?Level of Care Concerns in a patient with Anxiety:  (Status: New goal.) ?Current level of care: home, alone and temp at SNF ?Evaluation of patient's unmet needs in current living environment ?ADL's ?Community Alternative Program  (CAP) provided information ?[facility]Medicaid pending will call LCSW once approved) ?  ?Mental Health:  (Status: Condition stable. Not addressed this visit.) ?Evaluation of current treatment plan related to  symptoms of anxiety ?Solution-Focused Strategies employed:  ?Problem SoPrestontrategies reviewed ? ?Social  Determinants of Health in Patient with Anxiety and Parkinson Disease:  (Status: New goal. Goal Met. No Needs Identified this visit) ?SDOH assessments completed: Housing  and Transportation ?Evaluation of current treatment plan related to SDOH ?Made referral to Care guide ?  ?Task & activities to accomplish goals: ?[place] [phone] me as soon as you are approved for Medicaid ?  ?  ? ?The patient verbalized understanding of instructions, educational materials, and care plan provided today and declined offer to receive copy of patient instructions, educational materials, and care plan.  ?Telephone follow up appointment with pharmacy team member scheduled for: 6 months ? ?ChEdythe ClarityRPAngelina Theresa [facility]?ChBeverly MilchPharmD ?Clinical Pharmacist  ?[place]([phone] ?

## 2022-03-06 NOTE — Telephone Encounter (Signed)
Pt's daughter has questions about what amounts of time were ordered for pt. ? ?Please call back. ?

## 2022-03-11 NOTE — Telephone Encounter (Signed)
Not sure how or why we haven't received this until now, please advise ?

## 2022-03-11 NOTE — Telephone Encounter (Signed)
Please see note and advise. Not sure how this didn't come to our inbox until today.  ?

## 2022-03-11 NOTE — Telephone Encounter (Signed)
They will determine her duration at her evaluation with the [facility] team. We do not write any orders for this initially. ? ?Algis Greenhouse. Jerline Pain, MD ?03/11/2022 2:51 PM  ? ?

## 2022-03-11 NOTE — Telephone Encounter (Signed)
I dont place orders for specific time. This is usually determined at her first meeting with the home health specialists. ? ?Algis Greenhouse. Jerline Pain, MD ?03/11/2022 2:37 PM  ? ?

## 2022-03-11 NOTE — Telephone Encounter (Signed)
Spoke with pt daughter Birdena Jubilee and advised information and how the duration was done through [facility] team at evaluation. Daughter and pt verbalized understanding.  ?

## 2022-03-11 NOTE — Telephone Encounter (Signed)
Pt's daughter has not received an update about this question. ? ?Please call back or redirect where appropriate. ?

## 2022-03-12 ENCOUNTER — Encounter (HOSPITAL_BASED_OUTPATIENT_CLINIC_OR_DEPARTMENT_OTHER): Payer: Medicare HMO | Attending: General Surgery | Admitting: General Surgery

## 2022-03-12 DIAGNOSIS — T8131XA Disruption of external operation (surgical) wound, not elsewhere classified, initial encounter: Secondary | ICD-10-CM | POA: Diagnosis not present

## 2022-03-12 DIAGNOSIS — M1990 Unspecified osteoarthritis, unspecified site: Secondary | ICD-10-CM | POA: Insufficient documentation

## 2022-03-12 DIAGNOSIS — L89153 Pressure ulcer of sacral region, stage 3: Secondary | ICD-10-CM | POA: Insufficient documentation

## 2022-03-12 DIAGNOSIS — G20 Parkinson's disease: Secondary | ICD-10-CM | POA: Insufficient documentation

## 2022-03-12 DIAGNOSIS — X58XXXA Exposure to other specified factors, initial encounter: Secondary | ICD-10-CM | POA: Insufficient documentation

## 2022-03-12 DIAGNOSIS — R278 Other lack of coordination: Secondary | ICD-10-CM | POA: Diagnosis not present

## 2022-03-12 DIAGNOSIS — T8189XA Other complications of procedures, not elsewhere classified, initial encounter: Secondary | ICD-10-CM | POA: Diagnosis not present

## 2022-03-12 DIAGNOSIS — L89150 Pressure ulcer of sacral region, unstageable: Secondary | ICD-10-CM | POA: Diagnosis not present

## 2022-03-12 DIAGNOSIS — R2689 Other abnormalities of gait and mobility: Secondary | ICD-10-CM | POA: Diagnosis not present

## 2022-03-12 NOTE — Progress Notes (Signed)
[name] [name], [name] ([id number]) ?Visit Report for 03/12/2022 ?Abuse Risk Screen Details ?Patient Name: Date of Service: ?[name], [name] 03/12/2022 2:15 PM ?Medical Record Number: [id number] ?Patient Account Number: [id number] ?Date of Birth/Sex: Treating RN: ?Jan 16, 1951 (71 y.o. [name] [name], [name] [name] ?Primary Care Esthefany Herrig: [name] [name] Other Clinician: ?Referring Sherrelle Prochazka: ?Treating [name] [name]/Extender: Fredirick Maudlin ?[name] [name] ?Weeks in Treatment: 0 ?Abuse Risk Screen Items ?Answer ?ABUSE RISK SCREEN: ?Has anyone close to you tried to hurt or harm you recentlyo No ?Do you feel uncomfortable with anyone in your familyo No ?Has anyone forced you do things that you didnt want to doo No ?Electronic Signature(s) ?Signed: 03/12/2022 5:45:24 PM By: Levan Hurst RN, BSN ?Entered By: Levan Hurst on 03/12/2022 14:42:25 ?-------------------------------------------------------------------------------- ?Activities of Daily Living Details ?Patient Name: Date of Service: ?[name], [name] 03/12/2022 2:15 PM ?Medical Record Number: [id number] ?Patient Account Number: [id number] ?Date of Birth/Sex: Treating RN: ?April 26, 1951 (71 y.o. [name] [name], [name] [name] ?Primary Care Dionisia Pacholski: [name] [name] Other Clinician: ?Referring [name] [name]: ?Treating Teretha Chalupa/Extender: Fredirick Maudlin ?[name] [name] ?Weeks in Treatment: 0 ?Activities of Daily Living Items ?Answer ?Activities of Daily Living (Please select one for each item) ?Drive Automobile Not Able ?T Medications ?ake Completely Able ?Use T elephone Completely Able ?Care for Appearance Need Assistance ?Use T oilet Need Assistance ?Bath / Shower Need Assistance ?Dress Self Need Assistance ?Feed Self Completely Able ?Walk Need Assistance ?Get In / Out Bed Need Assistance ?Housework Need Assistance ?Prepare Meals Need Assistance ?Handle Money Need Assistance ?Shop for Self Need Assistance ?Electronic Signature(s) ?Signed: 03/12/2022 5:45:24 PM By: Levan Hurst RN, BSN ?Entered By: Levan Hurst  on 03/12/2022 14:42:46 ?-------------------------------------------------------------------------------- ?Education Screening Details ?Patient Name: ?Date of Service: ?[name], [name] 03/12/2022 2:15 PM ?Medical Record Number: [id number] ?Patient Account Number: [id number] ?Date of Birth/Sex: ?Treating RN: ?1950/11/16 (71 y.o. [name] [name], [name] [name] ?Primary Care [name] [name]: [name] [name] ?Other Clinician: ?Referring Aydenn Gervin: ?Treating Moira Umholtz/Extender: Fredirick Maudlin ?[name] [name] ?Weeks in Treatment: 0 ?Primary Learner Assessed: Patient ?Learning Preferences/Education Level/Primary Language ?Learning Preference: Explanation, Demonstration, Printed Material ?Highest Education Level: College or Above ?Preferred Language: English ?Cognitive Barrier ?Language Barrier: No ?Translator Needed: No ?Memory Deficit: No ?Emotional Barrier: No ?Cultural/Religious Beliefs Affecting Medical Care: No ?Physical Barrier ?Impaired Vision: No ?Impaired Hearing: No ?Decreased Hand dexterity: No ?Knowledge/Comprehension ?Knowledge Level: High ?Comprehension Level: High ?Ability to understand written instructions: High ?Ability to understand verbal instructions: High ?Motivation ?Anxiety Level: Calm ?Cooperation: Cooperative ?Education Importance: Acknowledges Need ?Interest in Health Problems: Asks Questions ?Perception: Coherent ?Willingness to Engage in Self-Management High ?Activities: ?Readiness to Engage in Self-Management High ?Activities: ?Electronic Signature(s) ?Signed: 03/12/2022 5:45:24 PM By: Levan Hurst RN, BSN ?Entered By: Levan Hurst on 03/12/2022 14:43:10 ?-------------------------------------------------------------------------------- ?Fall Risk Assessment Details ?Patient Name: ?Date of Service: ?[name], [name] 03/12/2022 2:15 PM ?Medical Record Number: [id number] ?Patient Account Number: [id number] ?Date of Birth/Sex: ?Treating RN: ?[birth year] (71 y.o. [name] [name], [name] [name] ?Primary Care [name] [name]: [name] [name] ?Other  Clinician: ?Referring [name] [name]: ?Treating Zareena Willis/Extender: Fredirick Maudlin ?[name] [name] ?Weeks in Treatment: 0 ?Fall Risk Assessment Items ?Have you had 2 or more falls in the last 12 monthso 0 No ?Have you had any fall that resulted in injury in the last 12 monthso 0 No ?FALLS RISK SCREEN ?History of falling - immediate or within 3 months 0 No ?Secondary diagnosis (Do you have 2 or more medical diagnoseso) 15 Yes ?Ambulatory aid ?None/bed rest/wheelchair/nurse 0 Yes ?Crutches/cane/walker 0 No ?Furniture 0 No ?Intravenous therapy Access/Saline/Heparin Lock 0 No ?Gait/Transferring ?Normal/ bed rest/ wheelchair 0 No ?Weak (short steps with or without shuffle,  stooped but able to lift head while walking, may seek 10 Yes ?support from furniture) ?Impaired (short steps with shuffle, may have difficulty arising from chair, head down, impaired 0 No ?balance) ?Mental Status ?Oriented to own ability 0 Yes ?Electronic Signature(s) ?Signed: 03/12/2022 5:45:24 PM By: Levan Hurst RN, BSN ?Entered By: Levan Hurst on 03/12/2022 14:43:36 ?-------------------------------------------------------------------------------- ?Nutrition Risk Screening Details ?Patient Name: ?Date of Service: ?[name], [name] [name] 03/12/2022 2:15 PM ?Medical Record Number: [id number] ?Patient Account Number: [id number] ?Date of Birth/Sex: ?Treating RN: ?May 03, 1951 (72 y.o. [name] [name], [name] [name] ?Primary Care Jamariah [name]: [name] [name] ?Other Clinician: ?Referring [name] [name]: ?Treating Bliss Behnke/Extender: Fredirick Maudlin ?[name] [name] ?Weeks in Treatment: 0 ?Height (in): 67 ?Weight (lbs): 106 ?Body Mass Index (BMI): 16.6 ?Nutrition Risk Screening Items ?Score Screening ?NUTRITION RISK SCREEN: ?I have an illness or condition that made me change the kind and/or amount of food I eat 0 No ?I eat fewer than two meals per day 0 No ?I eat few fruits and vegetables, or milk products 0 No ?I have three or more drinks of beer, liquor or wine almost every day 0 No ?I have  tooth or mouth problems that make it hard for me to eat 0 No ?I don't always have enough money to buy the food I need 0 No ?I eat alone most of the time 0 No ?I take three or more different prescribed or over-the-counter drugs a day 1 Yes ?Without wanting to, I have lost or gained 10 pounds in the last six months 0 No ?I am not always physically able to shop, cook and/or feed myself 2 Yes ?Nutrition Protocols ?Good Risk Protocol ?Moderate Risk Protocol 0 Provide education on nutrition ?High Risk Proctocol ?Risk Level: Moderate Risk ?Score: 3 ?Electronic Signature(s) ?Signed: 03/12/2022 5:45:24 PM By: Levan Hurst RN, BSN ?Entered By: Levan Hurst on 03/12/2022 14:43:45 ?

## 2022-03-12 NOTE — Progress Notes (Signed)
[name] [name] ([id number]) ?Visit Report for 03/12/2022 ?Allergy List Details ?Patient Name: Date of Service: ?[name] [name], [name] [name] 03/12/2022 2:15 PM ?Medical Record Number: [id number] ?Patient Account Number: [id number] ?Date of Birth/Sex: Treating RN: ?[name] (71 y.o. [name] [name], [name] [name] ?Primary Care [name] [name]: [name] [name] Other Clinician: ?Referring [name] [name]: ?Treating [name] [name]/Extender: [name] [name] ?[name] [name] ?Weeks in Treatment: 0 ?Allergies ?Active Allergies ?Sulfa (Sulfonamide Antibiotics) ?Severity: Moderate ?Allergy Notes ?Electronic Signature(s) ?Signed: 03/12/2022 5:45:24 PM By: [name] Hurst RN, BSN ?Entered By: [name] [name] on 03/12/2022 14:39:34 ?-------------------------------------------------------------------------------- ?Arrival Information Details ?Patient Name: Date of Service: ?[name] [name], [name] [name] 03/12/2022 2:15 PM ?Medical Record Number: [id number] ?Patient Account Number: [id number] ?Date of Birth/Sex: Treating RN: ?[name] (71 y.o. [name] [name], [name] [name] ?Primary Care [name] [name]: [name] [name] Other Clinician: ?Referring [name] [name]: ?Treating [name] [name]/Extender: [name] [name] ?[name] [name] ?Weeks in Treatment: 0 ?Visit Information ?Patient Arrived: Wheel Chair ?Arrival Time: 14:24 ?Accompanied By: daughter ?Transfer Assistance: Manual ?Patient Identification Verified: Yes ?Secondary Verification Process Completed: Yes ?Patient Requires Transmission-Based Precautions: No ?Patient Has Alerts: No ?Electronic Signature(s) ?Signed: 03/12/2022 5:45:24 PM By: [name] Hurst RN, BSN ?Entered By: [name] [name] on 03/12/2022 14:37:09 ?-------------------------------------------------------------------------------- ?Clinic Level of Care Assessment Details ?Patient Name: Date of Service: ?[name] [name], [name] [name] 03/12/2022 2:15 PM ?Medical Record Number: [id number] ?Patient Account Number: [id number] ?Date of Birth/Sex: Treating RN: ?[birth year] (71 y.o. [name] [name], [name] [name] ?Primary Care Jakylah Bassinger: [name] [name]  Other Clinician: ?Referring [name] [name]: ?Treating [name] [name]/Extender: [name] [name] ?[name] [name] ?Weeks in Treatment: 0 ?Clinic Level of Care Assessment Items ?TOOL 1 Quantity Score ?X- 1 0 ?Use when EandM and Procedure is performed on INITIAL visit ?ASSESSMENTS - Nursing Assessment / Reassessment ?X- 1 20 ?General Physical Exam (combine w/ comprehensive assessment (listed just below) when performed on new pt. evals) ?X- 1 25 ?Comprehensive Assessment (HX, ROS, Risk Assessments, Wounds Hx, etc.) ?ASSESSMENTS - Wound and Skin Assessment / Reassessment ?'[]'$  - 0 ?Dermatologic / Skin Assessment (not related to wound area) ?ASSESSMENTS - Ostomy and/or Continence Assessment and Care ?'[]'$  - 0 ?Incontinence Assessment and Management ?'[]'$  - 0 ?Ostomy Care Assessment and Management (repouching, etc.) ?PROCESS - Coordination of Care ?X - Simple Patient / Family Education for ongoing care 1 15 ?'[]'$  - 0 ?Complex (extensive) Patient / Family Education for ongoing care ?X- 1 10 ?Staff obtains Consents, Records, T Results / Process Orders ?est ?X- 1 10 ?Staff telephones HHA, Nursing Homes / Clarify orders / etc ?'[]'$  - 0 ?Routine Transfer to another Facility (non-emergent condition) ?'[]'$  - 0 ?Routine Hospital Admission (non-emergent condition) ?X- 1 15 ?New Admissions / Biomedical engineer / Ordering NPWT Apligraf, etc. ?, ?'[]'$  - 0 ?Emergency Hospital Admission (emergent condition) ?PROCESS - Special Needs ?'[]'$  - 0 ?Pediatric / Minor Patient Management ?'[]'$  - 0 ?Isolation Patient Management ?'[]'$  - 0 ?Hearing / Language / Visual special needs ?'[]'$  - 0 ?Assessment of Community assistance (transportation, D/C planning, etc.) ?'[]'$  - 0 ?Additional assistance / Altered mentation ?'[]'$  - 0 ?Support Surface(s) Assessment (bed, cushion, seat, etc.) ?INTERVENTIONS - Miscellaneous ?'[]'$  - 0 ?External ear exam ?'[]'$  - 0 ?Patient Transfer (multiple staff / Civil Service fast streamer / Similar devices) ?'[]'$  - 0 ?Simple Staple / Suture removal (25 or less) ?'[]'$  - 0 ?Complex  Staple / Suture removal (26 or more) ?'[]'$  - 0 ?Hypo/Hyperglycemic Management (do not check if billed separately) ?'[]'$  - 0 ?Ankle / Brachial Index (ABI) - do not check if billed separately ?Has the patient been seen at the hospital within the last three years: Yes ?Total Score: 95 ?Level Of Care: New/Established - Level  3 ?Electronic Signature(s) ?Signed: 03/12/2022 5:45:24 PM By: [name] Hurst RN, BSN ?Entered By: [name] [name] on 03/12/2022 17:43:17 ?-------------------------------------------------------------------------------- ?Encounter Discharge Information Details ?Patient Name: ?Date of Service: ?[name] [name], [name] [name] 03/12/2022 2:15 PM ?Medical Record Number: [id number] ?Patient Account Number: [id number] ?Date of Birth/Sex: ?Treating RN: ?[name] [name] (71 y.o. [name] [name], [name] [name] ?Primary Care [name] [name]: [name] [name] ?Other Clinician: ?Referring [name] [name]: ?Treating [name] [name]: [name] [name] ?[name] [name] ?Weeks in Treatment: 0 ?Encounter Discharge Information Items Post Procedure Vitals ?Discharge Condition: Stable ?Temperature (F): 97.5 ?Ambulatory Status: Ambulatory ?Pulse (bpm): 86 ?Discharge Destination: Home ?Respiratory Rate (breaths/min): 16 ?Transportation: Private Auto ?Blood Pressure (mmHg): 104/66 ?Accompanied By: alone ?Schedule Follow-up Appointment: Yes ?Clinical Summary of Care: Patient Declined ?Electronic Signature(s) ?Signed: 03/12/2022 5:45:24 PM By: [name] Hurst RN, BSN ?Entered By: [name] [name] on 03/12/2022 17:44:07 ?-------------------------------------------------------------------------------- ?Multi Wound Chart Details ?Patient Name: ?Date of Service: ?[name] [name], [name] [name] 03/12/2022 2:15 PM ?Medical Record Number: [id number] ?Patient Account Number: [id number] ?Date of Birth/Sex: ?Treating RN: ?[name] (71 y.o. [name] [name], [name] [name] ?Primary Care [name] [name]: [name] [name] ?Other Clinician: ?Referring Andres Escandon: ?Treating Hannia Matchett/Extender: [name] [name] ?[name] [name] ?Weeks in  Treatment: 0 ?Vital Signs ?Height(in): 67 ?Pulse(bpm): 86 ?Weight(lbs): 106 ?Blood Pressure(mmHg): 104/66 ?Body Mass Index(BMI): 16.6 ?Temperature(??F): 97.5 ?Respiratory Rate(breaths/min): 18 ?Photos: [N/A:N/A] ?Left, Distal Abdomen - Lower Quadrant Sacrum N/A ?Wound Location: ?Surgical Injury Pressure Injury N/A ?Wounding Event: ?Dehisced Wound Pressure Ulcer N/A ?Primary Etiology: ?Cataracts, Osteoarthritis Cataracts, Osteoarthritis N/A ?Comorbid History: ?02/17/2022 02/17/2022 N/A ?Date Acquired: ?0 0 N/A ?Weeks of Treatment: ?Open Open N/A ?Wound Status: ?No No N/A ?Wound Recurrence: ?0.5x4.8x0.2 3.5x2.4x3.2 N/A ?Measurements L x W x D (cm) ?1.885 6.597 N/A ?A (cm?) : ?rea ?0.377 21.112 N/A ?Volume (cm?) : ?0.00% 0.00% N/A ?% Reduction in A rea: ?0.00% 0.00% N/A ?% Reduction in Volume: ?10 ?Starting Position 1 (o'clock): ?3 ?Ending Position 1 (o'clock): ?3.4 ?Maximum Distance 1 (cm): ?No Yes N/A ?Undermining: ?Full Thickness Without Exposed Category/Stage III N/A ?Classification: ?Support Structures ?Medium Medium N/A ?Exudate A mount: ?Serosanguineous Serosanguineous N/A ?Exudate Type: ?red, brown red, brown N/A ?Exudate Color: ?Flat and Intact Well defined, not attached N/A ?Wound Margin: ?Large (67-100%) Large (67-100%) N/A ?Granulation A mount: ?Red, Pink Red, Pink N/A ?Granulation Quality: ?None Present (0%) Small (1-33%) N/A ?Necrotic A mount: ?Fat Layer (Subcutaneous Tissue): Yes Fat Layer (Subcutaneous Tissue): Yes N/A ?Exposed Structures: ?Fascia: No ?Fascia: No ?Tendon: No ?Tendon: No ?Muscle: No ?Muscle: No ?Joint: No ?Joint: No ?Bone: No ?Bone: No ?None None N/A ?Epithelialization: ?N/A Debridement - Selective/Open Wound N/A ?Debridement: ?Pre-procedure Verification/Time Out N/A 15:35 N/A ?Taken: ?N/A Slough N/A ?Tissue Debrided: ?N/A Non-Viable Tissue N/A ?Level: ?N/A 8.4 N/A ?Debridement A (sq cm): ?rea ?N/A Curette N/A ?Instrument: ?N/A Minimum N/A ?Bleeding: ?N/A Pressure N/A ?Hemostasis A  chieved: ?N/A 0 N/A ?Procedural Pain: ?N/A 0 N/A ?Post Procedural Pain: ?N/A Procedure was tolerated well N/A ?Debridement Treatment Response: ?N/A 3.5x2.4x3.2 N/A ?Post Debridement Measurements L x ?W x D (cm) ?N/A

## 2022-03-13 ENCOUNTER — Non-Acute Institutional Stay: Payer: Medicare HMO | Admitting: Student

## 2022-03-13 DIAGNOSIS — R627 Adult failure to thrive: Secondary | ICD-10-CM | POA: Diagnosis not present

## 2022-03-13 DIAGNOSIS — G20 Parkinson's disease: Secondary | ICD-10-CM | POA: Diagnosis not present

## 2022-03-13 DIAGNOSIS — K4131 Unilateral femoral hernia, with obstruction, without gangrene, recurrent: Secondary | ICD-10-CM | POA: Diagnosis not present

## 2022-03-13 DIAGNOSIS — D508 Other iron deficiency anemias: Secondary | ICD-10-CM | POA: Diagnosis not present

## 2022-03-13 DIAGNOSIS — F419 Anxiety disorder, unspecified: Secondary | ICD-10-CM

## 2022-03-13 DIAGNOSIS — F411 Generalized anxiety disorder: Secondary | ICD-10-CM | POA: Diagnosis not present

## 2022-03-13 DIAGNOSIS — E43 Unspecified severe protein-calorie malnutrition: Secondary | ICD-10-CM

## 2022-03-13 DIAGNOSIS — R531 Weakness: Secondary | ICD-10-CM

## 2022-03-13 DIAGNOSIS — K632 Fistula of intestine: Secondary | ICD-10-CM | POA: Diagnosis not present

## 2022-03-13 DIAGNOSIS — Z515 Encounter for palliative care: Secondary | ICD-10-CM

## 2022-03-13 NOTE — Progress Notes (Signed)
[name] [name] ([id number]) ?Visit Report for 03/12/2022 ?Chief Complaint Document Details ?Patient Name: Date of Service: ?[name] [name], [name] [name] 03/12/2022 2:15 PM ?Medical Record Number: [id number] ?Patient Account Number: [id number] ?Date of Birth/Sex: Treating RN: ?08-22-1951 (71 y.o. [name] [name], [name] [name] ?Primary Care Provider: Dimas Chyle Other Clinician: ?Referring Provider: ?Treating Provider/Extender: Fredirick Maudlin ?Dimas Chyle ?Weeks in Treatment: 0 ?Information Obtained from: Patient ?Chief Complaint ?The patient presents to the wound care center today for evaluation of a stage III sacral pressure ulcer as well as a dehisced surgical wound from an emergent ?femoral hernia repair. ?Electronic Signature(s) ?Signed: 03/12/2022 4:02:26 PM By: Fredirick Maudlin MD FACS ?Entered By: Fredirick Maudlin on 03/12/2022 16:02:26 ?-------------------------------------------------------------------------------- ?Debridement Details ?Patient Name: Date of Service: ?[name] [name], [name] [name] 03/12/2022 2:15 PM ?Medical Record Number: [id number] ?Patient Account Number: [id number] ?Date of Birth/Sex: Treating RN: ?1951/02/26 (71 y.o. [name] [name], [name] [name] ?Primary Care Provider: Dimas Chyle Other Clinician: ?Referring Provider: ?Treating Provider/Extender: Fredirick Maudlin ?Dimas Chyle ?Weeks in Treatment: 0 ?Debridement Performed for Assessment: Wound #2 Sacrum ?Performed By: Physician Fredirick Maudlin, MD ?Debridement Type: Debridement ?Level of Consciousness (Pre-procedure): Awake and Alert ?Pre-procedure Verification/Time Out Yes - 15:35 ?Taken: ?Start Time: 15:35 ?T Area Debrided (L x W): ?otal 3.5 (cm) x 2.4 (cm) = 8.4 (cm?) ?Tissue and other material debrided: Non-Viable, [place], [place] ?Level: Non-Viable Tissue ?Debridement Description: Selective/Open Wound ?Instrument: Curette ?Bleeding: Minimum ?Hemostasis Achieved: Pressure ?End Time: 15:37 ?Procedural Pain: 0 ?Post Procedural Pain: 0 ?Response to Treatment: Procedure was  tolerated well ?Level of Consciousness (Post- Awake and Alert ?procedure): ?Post Debridement Measurements of Total Wound ?Length: (cm) 3.5 ?Stage: Category/Stage III ?Width: (cm) 2.4 ?Depth: (cm) 3.2 ?Volume: (cm?) 21.112 ?Character of Wound/Ulcer Post Debridement: Improved ?Post Procedure Diagnosis ?Same as Pre-procedure ?Electronic Signature(s) ?Signed: 03/12/2022 5:05:48 PM By: Fredirick Maudlin MD FACS ?Signed: 03/12/2022 5:45:24 PM By: Levan Hurst RN, BSN ?Entered By: Levan Hurst on 03/12/2022 15:39:36 ?-------------------------------------------------------------------------------- ?HPI Details ?Patient Name: Date of Service: ?[name] [name], [name] [name] 03/12/2022 2:15 PM ?Medical Record Number: [id number] ?Patient Account Number: [id number] ?Date of Birth/Sex: Treating RN: ?1951-03-02 (71 y.o. [name] [name], [name] [name] ?Primary Care Provider: Dimas Chyle Other Clinician: ?Referring Provider: ?Treating Provider/Extender: Fredirick Maudlin ?Dimas Chyle ?Weeks in Treatment: 0 ?History of Present Illness ?HPI Description: ADMISSION ?03/12/2022 ?This is a 71 year old woman who underwent an emergency femoral hernia repair secondary to strangulated small bowel. She required a bowel resection and ?reanastomosis. While she was in the hospital, the wound dehisced and she was initially felt to have an enterocutaneous fistula. They were managing the ?effluent with an ostomy bag. She saw her general surgeon on May 11. At that time, there was no evidence of EC fistula and the wound was closing in nicely. ?This is currently being managed with silver alginate. Also during her hospitalization, she unfortunately developed a pressure ulcer. This is currently stage III. ?She is in a skilled nursing facility at the moment but will be discharged on Sunday. This wound is being managed with Dakin's wet to dry dressing changes. She ?is not a diabetic, nor does she smoke. Her only real medical history is Parkinson's disease and fairly significant  protein-calorie malnutrition. ?Electronic Signature(s) ?Signed: 03/12/2022 4:19:47 PM By: Fredirick Maudlin MD FACS ?Entered By: Fredirick Maudlin on 03/12/2022 16:19:47 ?-------------------------------------------------------------------------------- ?Physical Exam Details ?Patient Name: Date of Service: ?[name] [name], [name] [name] 03/12/2022 2:15 PM ?Medical Record Number: [id number] ?Patient Account Number: [id number] ?Date of Birth/Sex: Treating RN: ?[birth year] (71 y.o. [name] [name], [name] [name] ?Primary Care Provider: Dimas Chyle Other Clinician: ?Referring Provider: ?Treating Provider/Extender: Fredirick Maudlin ?  Dimas Chyle ?Weeks in Treatment: 0 ?Constitutional ?. . . . She is extremely underweight.Marland Kitchen ?Respiratory ?Normal work of breathing on room air.Marland Kitchen ?Notes ?03/12/2022: In her left inguinal fold, there is a surgical incision that is open in 2 areas. The wound surface is very clean and the wound is not deep at all. There is ?no erythema, induration, or drainage from the site. No evidence of EC fistula. On her sacrum, there is a stage III pressure ulcer with fairly significant ?undermining. There is a minimal amount of accumulated slough but otherwise the wound is very clean. Despite her extremely thin habitus, bone is not palpable ?at the base. ?Electronic Signature(s) ?Signed: 03/12/2022 4:22:39 PM By: Fredirick Maudlin MD FACS ?Entered By: Fredirick Maudlin on 03/12/2022 16:22:39 ?-------------------------------------------------------------------------------- ?Physician Orders Details ?Patient Name: ?Date of Service: ?[name] [name], [name] [name] 03/12/2022 2:15 PM ?Medical Record Number: [id number] ?Patient Account Number: [id number] ?Date of Birth/Sex: ?Treating RN: ?1951/05/01 (71 y.o. [name] [name], [name] [name] ?Primary Care Provider: Dimas Chyle ?Other Clinician: ?Referring Provider: ?Treating Provider/Extender: Fredirick Maudlin ?Dimas Chyle ?Weeks in Treatment: 0 ?Verbal / Phone Orders: No ?Diagnosis Coding ?ICD-10 Coding ?Code  Description ?T81.31XA Disruption of external operation (surgical) wound, not elsewhere classified, initial encounter ?L89.153 Pressure ulcer of sacral region, stage 3 ?G20 Parkinson's disease ?Follow-up Appointments ?ppointment in 1 week. - Dr. Celine Ahr - Room 4 - Thursday 5/25 at 2:00 ?Return A ?Bathing/ Shower/ Hygiene ?May shower with protection but do not get wound dressing(s) wet. ?Off-Loading ?Low air-loss mattress (Group 2) ?Turn and reposition every 2 hours ?Wound Treatment ?Wound #1 - Abdomen - Lower Quadrant Wound Laterality: Left, Distal ?Cleanser: Soap and Water Every Other Day/15 Days ?Discharge Instructions: May shower and wash wound with dial antibacterial soap and water prior to dressing change. ?Cleanser: Wound Cleanser Every Other Day/15 Days ?Discharge Instructions: Cleanse the wound with wound cleanser prior to applying a clean dressing using gauze sponges, not tissue or cotton balls. ?Peri-Wound Care: Skin Prep Every Other Day/15 Days ?Discharge Instructions: Use skin prep as directed ?Prim Dressing: KerraCel Ag Gelling Fiber Dressing, 2x2 in (silver alginate) Every Other Day/15 Days ?ary ?Discharge Instructions: Apply silver alginate to wound bed as instructed ?Secondary Dressing: ALLEVYN Gentle Border, 4x4 (in/in) Every Other Day/15 Days ?Discharge Instructions: Apply over primary dressing as directed. ?Wound #2 - Sacrum ?Cleanser: Soap and Water Every Other Day/15 Days ?Discharge Instructions: May shower and wash wound with dial antibacterial soap and water prior to dressing change. ?Cleanser: Wound Cleanser Every Other Day/15 Days ?Discharge Instructions: Cleanse the wound with wound cleanser prior to applying a clean dressing using gauze sponges, not tissue or cotton balls. ?Peri-Wound Care: Skin Prep Every Other Day/15 Days ?Discharge Instructions: Use skin prep as directed ?Prim Dressing: Dakin's Solution 0.25%, 16 (oz) Every Other Day/15 Days ?ary ?Discharge Instructions: Moisten gauze  with Dakin's solution ?Secondary Dressing: ALLEVYN Gentle Border, 4x4 (in/in) Every Other Day/15 Days ?Discharge Instructions: Apply over primary dressing as directed. ?Electronic Signature(s) ?Signed: 03/12/2022 5:05:48 PM By:

## 2022-03-14 DIAGNOSIS — R2689 Other abnormalities of gait and mobility: Secondary | ICD-10-CM | POA: Diagnosis not present

## 2022-03-14 DIAGNOSIS — R278 Other lack of coordination: Secondary | ICD-10-CM | POA: Diagnosis not present

## 2022-03-14 DIAGNOSIS — G20 Parkinson's disease: Secondary | ICD-10-CM | POA: Diagnosis not present
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
## 2022-03-19 ENCOUNTER — Encounter: Payer: Self-pay | Admitting: Family Medicine

## 2022-03-19 ENCOUNTER — Ambulatory Visit (INDEPENDENT_AMBULATORY_CARE_PROVIDER_SITE_OTHER): Payer: Medicare HMO | Admitting: Family Medicine

## 2022-03-19 VITALS — BP 108/74 | HR 87 | Temp 98.0°F | Ht 67.0 in | Wt 107.8 lb

## 2022-03-19 DIAGNOSIS — G20 Parkinson's disease: Secondary | ICD-10-CM

## 2022-03-19 DIAGNOSIS — L89151 Pressure ulcer of sacral region, stage 1: Secondary | ICD-10-CM | POA: Diagnosis not present

## 2022-03-19 DIAGNOSIS — Z1211 Encounter for screening for malignant neoplasm of colon: Secondary | ICD-10-CM

## 2022-03-19 DIAGNOSIS — K632 Fistula of intestine: Secondary | ICD-10-CM

## 2022-03-19 DIAGNOSIS — F419 Anxiety disorder, unspecified: Secondary | ICD-10-CM

## 2022-03-19 DIAGNOSIS — E43 Unspecified severe protein-calorie malnutrition: Secondary | ICD-10-CM | POA: Diagnosis not present

## 2022-03-19 MED ORDER — PRO-STAT 64 PO LIQD: 30.0000 mL | Freq: Three times a day (TID) | ORAL | 5 refills | Status: AC

## 2022-03-19 NOTE — Assessment & Plan Note (Signed)
Continue regimen per neurology with Sinemet 25-250 four times daily.

## 2022-03-19 NOTE — Progress Notes (Signed)
[name] [name] is a 71 y.o. female who presents today for an office visit.  Assessment/Plan:  Chronic Problems Addressed Today: Parkinson disease ([place]) Continue regimen per neurology with Sinemet 25-250 four times daily.  Enterocutaneous fistula Slowly healing.  Continue management per wound care center.  She needs wound care 3 times weekly per her rehab facility.  Pressure injury of coccygeal region, stage 1 Continue management per wound care.  She needs three times weekly wound care per rehab facility.  Protein-calorie malnutrition, severe She is up about 6 pounds since her last visit.  Congratulated patient on her weight gain.  We discussed importance of high-calorie and high-protein diet.  We will give prescription for nutritional protein supplements.     Subjective:  HPI:  Patient here for nursing home follow up.  We saw her in the office 2 weeks ago for a hospital follow-up visit.  She was doing well at that time though still has some ongoing issues with neck pain as well as need to establish with home health for ongoing wound care and physical therapy.  She has been home for 4 days.  She has been doing well.  She still has a little fatigue but this seems to be improving.  She has established with wound care and physical therapy.  Her rehab facility told her that she would wound care 3 times per week.  See A/p for status of chronic conditions.   PMH:  The following were reviewed and entered/updated in epic: Past Medical History:  Diagnosis Date   Fibroid    GERD (gastroesophageal reflux disease)    Muscle weakness of lower extremity 01/17/2015   Osteopenia    Parkinson disease ([place]) 01/17/2015   Patient Active Problem List   Diagnosis Date Noted   Enterocutaneous fistula 02/05/2022   Normocytic anemia 02/05/2022   Pressure injury of coccygeal region, stage 1 01/23/2022   Goals of care, counseling/discussion    Protein-calorie malnutrition, severe 01/20/2022   DDD  (degenerative disc disease), cervical 06/18/2019   Constipation 03/02/2019   Seasonal allergies 03/02/2019   Anxiety 05/07/2018   Fatigue 04/20/2018   Osteoarthritis 04/20/2018   Palpitations 04/20/2018   Cardiomegaly 04/20/2018   Lesion of liver 04/20/2018   Lung nodule 04/20/2018   Debility 01/17/2015   Parkinson disease ([place]) 01/17/2015   Fibroid uterus 11/13/2012   Benign colonic polyp 10/02/2012   Past Surgical History:  Procedure Laterality Date   FINGER SURGERY     INGUINAL HERNIA REPAIR Left 01/17/2022   Procedure: Left femoral hernia repair with mesh and small bowel resection and anastomosis;  Surgeon: Kinsinger, Arta Bruce, MD;  Location: [facility];  Service: General;  Laterality: Left;   MOUTH SURGERY     TOE SURGERY     TONSILLECTOMY     TUBAL LIGATION     x2    Family History  Problem Relation Age of Onset   Rheum arthritis Mother    Breast cancer Mother        84's   Cancer Father        throat   Breast cancer Sister        77's   Hypertension Brother    Hyperlipidemia Brother    Heart attack Son        Deceased, 104   Healthy Daughter    Parkinsonism Neg Hx     Medications- reviewed and updated Current Outpatient Medications  Medication Sig Dispense Refill   acetaminophen (TYLENOL) 160 MG/5ML solution Take 20.3  mLs (650 mg total) by mouth every 6 (six) hours as needed for mild pain. 120 mL 0   Amino Acids-Protein Hydrolys (FEEDING SUPPLEMENT, PRO-STAT 64,) LIQD Take 30 mLs by mouth 3 (three) times daily with meals. 900 mL 5   Ascorbic Acid (VITAMIN C PO) Take 1 tablet by mouth daily.     b complex vitamins capsule Take 1 capsule by mouth daily.     Cholecalciferol (VITAMIN D) 2000 UNITS CAPS Take 2,000 Units by mouth daily.     feeding supplement (ENSURE ENLIVE / ENSURE PLUS) LIQD Take 237 mLs by mouth 3 (three) times daily between meals. 237 mL 12   glucosamine-chondroitin 500-400 MG tablet Take 1 tablet by mouth daily.     guaiFENesin-dextromethorphan  (ROBITUSSIN DM) 100-10 MG/5ML syrup Take 15 mLs by mouth 2 (two) times daily. 118 mL 0   hydrocortisone cream 1 % Apply topically daily. 30 g 0   hydrOXYzine (ATARAX) 10 MG tablet Take 1 tablet (10 mg total) by mouth 3 (three) times daily as needed for anxiety. 90 tablet 3   leptospermum manuka honey (MEDIHONEY) PSTE paste Apply 1 application. topically daily.     lidocaine (LIDODERM) 5 % Place 1 patch onto the skin daily. Remove & Discard patch within 12 hours or as directed by MD 30 patch 0   Multiple Vitamin (MULTIVITAMIN) capsule Take 1 capsule by mouth daily.     Omega-3 Fatty Acids (OMEGA 3 PO) Take 1 tablet by mouth daily.     tetrahydrozoline-zinc (VISINE-AC) 0.05-0.25 % ophthalmic solution Place 2 drops into both eyes 4 (four) times daily as needed (allergies).     vitamin B-12 (CYANOCOBALAMIN) 1000 MCG tablet Take 1,000 mcg by mouth daily.     carbidopa-levodopa (SINEMET IR) 25-250 MG tablet Take 1 tablet by mouth 4 (four) times daily. 120 tablet 0   pantoprazole (PROTONIX) 40 MG tablet Take 1 tablet (40 mg total) by mouth daily. 30 tablet 0   QUEtiapine (SEROQUEL) 25 MG tablet Take 1 tablet (25 mg total) by mouth at bedtime. (Patient not taking: Reported on 03/06/2022) 30 tablet 0   No current facility-administered medications for this visit.    Allergies-reviewed and updated Allergies  Allergen Reactions   Sulfa Antibiotics Other (See Comments)    Childhood allergy     Social History   Socioeconomic History   Marital status: Divorced    Spouse name: Not on file   Number of children: 4   Years of education: B.A.   Highest education level: Not on file  Occupational History   Occupation: SECRETARY  Tobacco Use   Smoking status: Never   Smokeless tobacco: Never  Vaping Use   Vaping Use: Never used  Substance and Sexual Activity   Alcohol use: No    Alcohol/week: 0.0 standard drinks   Drug use: No   Sexual activity: Never  Other Topics Concern   Not on file  Social  History Narrative   Patient is right handed.   Patient drinks 2-3 cups of caffeine per day.   Lives alone in a 2 story home.  Has 4 living children.  1 passed away at age 5.     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   [employer] level of education: college   Social Determinants of Radio broadcast assistant Strain: Low Risk    Difficulty of Paying Living Expenses: Not hard at [place]: No [place]   Worried About Estate manager/land agent  of Food in the Last Year: Never true   [place] in the Last Year: Never true  Transportation Needs: No Transportation Needs   Lack of Transportation (Medical): No   Lack of Transportation (Non-Medical): No  Physical Activity: Unknown   Days of Exercise per Week: 0 days   Minutes of Exercise per Session: Not on file  Stress: No Stress Concern Present   Feeling of Stress : Not at all  Social Connections: Moderately Isolated   Frequency of Communication with Friends and Family: More than three times a week   Frequency of Social Gatherings with Friends and Family: Twice a week   Attends Religious Services: More than 4 times per year   Active Member of [employer] or Organizations: No   Attends Music therapist: Never   Marital Status: Divorced          Objective:  Physical Exam: BP 108/74   Pulse 87   Temp 98 F (36.7 C) (Temporal)   Ht '5\' 7"'$  (1.702 m)   Wt 107 lb 12.8 oz (48.9 kg)   SpO2 99%   BMI 16.88 kg/m   Wt Readings from Last 3 Encounters:  03/19/22 107 lb 12.8 oz (48.9 kg)  03/01/22 101 lb 3.2 oz (45.9 kg)  02/11/22 104 lb 8 oz (47.4 kg)    Gen: No acute distress, resting comfortably CV: Regular rate and rhythm with no murmurs appreciated Pulm: Normal work of breathing, clear to auscultation bilaterally with no crackles, wheezes, or rhonchi Neuro: Grossly normal, moves all extremities Psych: Normal affect and thought content  Time Spent: 40 minutes of total time was spent on  the date of the encounter performing the following actions: chart review prior to seeing the patient including recent rehab stay, obtaining history, performing a medically necessary exam, counseling on the treatment plan, placing orders, and documenting in our EHR.        Algis Greenhouse. Jerline Pain, MD 03/19/2022 3:04 PM

## 2022-03-19 NOTE — Assessment & Plan Note (Signed)
Slowly healing.  Continue management per wound care center.  She needs wound care 3 times weekly per her rehab facility.

## 2022-03-19 NOTE — Assessment & Plan Note (Signed)
She is up about 6 pounds since her last visit.  Congratulated patient on her weight gain.  We discussed importance of high-calorie and high-protein diet.  We will give prescription for nutritional protein supplements.

## 2022-03-19 NOTE — Patient Instructions (Signed)
It was very nice to see you today!  I am glad that you are doing better.  I will refer you for your colonoscopy.  I will give you a prescription for protein supplements.  We will see back in a couple of months.  Come back sooner if needed.  Take care, Dr Jerline Pain  PLEASE NOTE:  If you had any lab tests please let us know if you have not heard back within a few days. You may see your results on mychart before we have a chance to review them but we will give you a call once they are reviewed by [place]. If we ordered any referrals today, please let us know if you have not heard from their office within the next week.   Please try these tips to maintain a healthy lifestyle:  Eat at least 3 REAL meals and 1-2 snacks per day.  Aim for no more than 5 hours between eating.  If you eat breakfast, please do so within one hour of getting up.   Each meal should contain half fruits/vegetables, one quarter protein, and one quarter carbs (no bigger than a computer mouse)  Cut down on sweet beverages. This includes juice, soda, and sweet tea.   Drink at least 1 glass of water with each meal and aim for at least 8 glasses per day  Exercise at least 150 minutes every week.

## 2022-03-19 NOTE — Assessment & Plan Note (Signed)
Continue management per wound care.  She needs three times weekly wound care per rehab facility.

## 2022-03-21 ENCOUNTER — Telehealth: Payer: Self-pay | Admitting: Family Medicine

## 2022-03-21 ENCOUNTER — Encounter (HOSPITAL_BASED_OUTPATIENT_CLINIC_OR_DEPARTMENT_OTHER): Payer: Medicare HMO | Admitting: General Surgery

## 2022-03-21 NOTE — Telephone Encounter (Addendum)
..  Home Health Verbal Orders  Agency:  [facility]  Caller: Advertising account executive and title [phone]  Requesting OT/ PT/ Skilled nursing/ Social Work/ Speech:  OT  Reason for Request:    Frequency:  1x week for 2 weeks and then 2x a week for 2 weeks  HH needs F2F w/in last 30 days

## 2022-03-21 NOTE — Telephone Encounter (Signed)
Ok with me. Please place any necessary orders. 

## 2022-03-21 NOTE — Telephone Encounter (Signed)
See note

## 2022-03-22 ENCOUNTER — Telehealth: Payer: Self-pay | Admitting: Family Medicine

## 2022-03-22 NOTE — Telephone Encounter (Signed)
.  Home Health Certification or Plan of Care Tracking  Is this a Certification or Plan of Care? yes  [facility] Agency: [facility]  Order Number:  [id number]  Has charge sheet been attached? yes  Where has form been placed:  In provider's box  Faxed to:   [phone]

## 2022-03-25 ENCOUNTER — Other Ambulatory Visit: Payer: Self-pay | Admitting: *Deleted

## 2022-03-27 DIAGNOSIS — F32A Depression, unspecified: Secondary | ICD-10-CM | POA: Diagnosis not present

## 2022-03-27 DIAGNOSIS — M1990 Unspecified osteoarthritis, unspecified site: Secondary | ICD-10-CM

## 2022-03-27 DIAGNOSIS — G20 Parkinson's disease: Secondary | ICD-10-CM

## 2022-03-27 NOTE — Telephone Encounter (Signed)
Left voice message with VO to Melissa #[phone]

## 2022-03-29 NOTE — Telephone Encounter (Signed)
Placed in Dr Jerline Pain office to be sign

## 2022-04-01 ENCOUNTER — Telehealth: Payer: Self-pay | Admitting: Pharmacist

## 2022-04-01 ENCOUNTER — Telehealth: Payer: Self-pay | Admitting: Family Medicine

## 2022-04-01 DIAGNOSIS — R627 Adult failure to thrive: Secondary | ICD-10-CM | POA: Diagnosis not present

## 2022-04-01 DIAGNOSIS — D649 Anemia, unspecified: Secondary | ICD-10-CM | POA: Diagnosis not present

## 2022-04-01 DIAGNOSIS — G20 Parkinson's disease: Secondary | ICD-10-CM | POA: Diagnosis not present

## 2022-04-01 DIAGNOSIS — K219 Gastro-esophageal reflux disease without esophagitis: Secondary | ICD-10-CM | POA: Diagnosis not present

## 2022-04-01 DIAGNOSIS — F411 Generalized anxiety disorder: Secondary | ICD-10-CM

## 2022-04-01 DIAGNOSIS — M6389 Disorders of muscle in diseases classified elsewhere, multiple sites: Secondary | ICD-10-CM

## 2022-04-01 DIAGNOSIS — K632 Fistula of intestine: Secondary | ICD-10-CM | POA: Diagnosis not present

## 2022-04-01 DIAGNOSIS — Z48815 Encounter for surgical aftercare following surgery on the digestive system: Secondary | ICD-10-CM | POA: Diagnosis not present

## 2022-04-01 DIAGNOSIS — K5900 Constipation, unspecified: Secondary | ICD-10-CM | POA: Diagnosis not present

## 2022-04-01 DIAGNOSIS — L89154 Pressure ulcer of sacral region, stage 4: Secondary | ICD-10-CM | POA: Diagnosis not present

## 2022-04-01 DIAGNOSIS — M5030 Other cervical disc degeneration, unspecified cervical region: Secondary | ICD-10-CM | POA: Diagnosis not present

## 2022-04-01 DIAGNOSIS — Z9181 History of falling: Secondary | ICD-10-CM

## 2022-04-01 NOTE — Progress Notes (Signed)
Chronic Care Management Pharmacy Assistant   Name: [name] [name]  MRN: [id number] DOB: 1950/12/29   Reason for Encounter: Medication Coordination Call   Recent office visits:  03/19/2022 OV (PCP) Vivi Barrack, MD; no medication changes indicated.  Recent consult visits:  None  Hospital visits:  None since last medication coordination call  Medications: Outpatient Encounter Medications as of 04/01/2022  Medication Sig   acetaminophen (TYLENOL) 160 MG/5ML solution Take 20.3 mLs (650 mg total) by mouth every 6 (six) hours as needed for mild pain.   Amino Acids-Protein Hydrolys (FEEDING SUPPLEMENT, PRO-STAT 64,) LIQD Take 30 mLs by mouth 3 (three) times daily with meals.   Ascorbic Acid (VITAMIN C PO) Take 1 tablet by mouth daily.   b complex vitamins capsule Take 1 capsule by mouth daily.   carbidopa-levodopa (SINEMET IR) 25-250 MG tablet Take 1 tablet by mouth 4 (four) times daily.   Cholecalciferol (VITAMIN D) 2000 UNITS CAPS Take 2,000 Units by mouth daily.   feeding supplement (ENSURE ENLIVE / ENSURE PLUS) LIQD Take 237 mLs by mouth 3 (three) times daily between meals.   glucosamine-chondroitin 500-400 MG tablet Take 1 tablet by mouth daily.   guaiFENesin-dextromethorphan (ROBITUSSIN DM) 100-10 MG/5ML syrup Take 15 mLs by mouth 2 (two) times daily.   hydrocortisone cream 1 % Apply topically daily.   hydrOXYzine (ATARAX) 10 MG tablet Take 1 tablet (10 mg total) by mouth 3 (three) times daily as needed for anxiety.   leptospermum manuka honey (MEDIHONEY) PSTE paste Apply 1 application. topically daily.   lidocaine (LIDODERM) 5 % Place 1 patch onto the skin daily. Remove & Discard patch within 12 hours or as directed by MD   Multiple Vitamin (MULTIVITAMIN) capsule Take 1 capsule by mouth daily.   Omega-3 Fatty Acids (OMEGA 3 PO) Take 1 tablet by mouth daily.   pantoprazole (PROTONIX) 40 MG tablet Take 1 tablet (40 mg total) by mouth daily.   QUEtiapine (SEROQUEL) 25 MG tablet  Take 1 tablet (25 mg total) by mouth at bedtime. (Patient not taking: Reported on 03/06/2022)   tetrahydrozoline-zinc (VISINE-AC) 0.05-0.25 % ophthalmic solution Place 2 drops into both eyes 4 (four) times daily as needed (allergies).   vitamin B-12 (CYANOCOBALAMIN) 1000 MCG tablet Take 1,000 mcg by mouth daily.   [DISCONTINUED] amantadine (SYMMETREL) 100 MG capsule Take 1 capsule (100 mg total) by mouth 2 (two) times daily. (Patient not taking: Reported on 09/21/2019)   No facility-administered encounter medications on file as of 04/01/2022.   Reviewed chart for medication changes ahead of medication coordination call.  No medication changes indicated.  BP Readings from Last 3 Encounters:  03/19/22 108/74  03/01/22 104/72  02/14/22 (!) 90/55    Lab Results  Component Value Date   HGBA1C 5.4 01/19/2022     Patient obtains medications through Vials  90 Days   Last adherence delivery included:  Buspirone 5 mg twice daily Carbidopa-Levodopa 25-100 mg three times daily Diazepam 2 mg  1/2 tablet q12hrs prn  Patient is due for next adherence delivery on: 04/11/2022. Called patient and reviewed medications and coordinated delivery.  This delivery to include: Buspirone 5 mg twice daily Carbidopa-Levodopa 25-100 mg three times daily Diazepam 2 mg  1/2 tablet q12hrs prn Pantoprazole 40 mg once daily  Patient needs refills for: Pantoprazole 40 mg Carbidopa-Levidopa 25-100 mg -Rx refill request sent to [facility].  Confirmed delivery date of 04/11/2022, advised patient that pharmacy will contact them the morning of delivery.  Care Gaps:  Medicare Annual Wellness: Completed 12/14/2021 Hemoglobin A1C: 5.4% on 01/19/2022 Colonoscopy: Overdue since 01/10/2016 Dexa Scan: Completed Mammogram: Next due on 07/12/2023  Future Appointments  Date Time Provider [place]  04/04/2022  1:15 PM [facility] SOCIAL [facility]  04/04/2022  2:30 PM Fredirick Maudlin, MD [facility]   05/17/2022  2:20 PM Vivi Barrack, MD [facility]  09/16/2022  2:00 PM [facility] CCM PHARMACIST [facility]  12/27/2022  3:15 PM [place]    April D Calhoun, [place] Pharmacist Assistant [phone]

## 2022-04-01 NOTE — Telephone Encounter (Signed)
Ok with me. Please place any necessary orders. 

## 2022-04-01 NOTE — Telephone Encounter (Signed)
Form placed on box to be faxed

## 2022-04-01 NOTE — Telephone Encounter (Signed)
..  Home Health verbal orders Caller Name: [place] Name: Tilford Pillar number: [phone]  Requesting OT/PT/Skilled nursing/Social Work/Speech: social wok  Reason: requesting social work subsequent visit for next week.  Frequency:  Please forward to [facility] pool or providers CMA

## 2022-04-01 NOTE — Telephone Encounter (Signed)
Ok to give VO? 

## 2022-04-03 NOTE — Telephone Encounter (Signed)
Called Marissa at [phone] VO given   Patient notified

## 2022-04-03 NOTE — Telephone Encounter (Signed)
Caller checking for update: Please call when ready.

## 2022-04-04 ENCOUNTER — Encounter (HOSPITAL_BASED_OUTPATIENT_CLINIC_OR_DEPARTMENT_OTHER): Payer: Medicare HMO | Attending: General Surgery | Admitting: General Surgery

## 2022-04-04 ENCOUNTER — Telehealth: Payer: Self-pay | Admitting: Family Medicine

## 2022-04-04 ENCOUNTER — Ambulatory Visit: Payer: Medicare HMO | Admitting: Licensed Clinical Social Worker

## 2022-04-04 DIAGNOSIS — M5030 Other cervical disc degeneration, unspecified cervical region: Secondary | ICD-10-CM

## 2022-04-04 DIAGNOSIS — K632 Fistula of intestine: Secondary | ICD-10-CM

## 2022-04-04 DIAGNOSIS — K219 Gastro-esophageal reflux disease without esophagitis: Secondary | ICD-10-CM | POA: Insufficient documentation

## 2022-04-04 DIAGNOSIS — L89153 Pressure ulcer of sacral region, stage 3: Secondary | ICD-10-CM | POA: Diagnosis not present

## 2022-04-04 DIAGNOSIS — M1990 Unspecified osteoarthritis, unspecified site: Secondary | ICD-10-CM | POA: Diagnosis not present

## 2022-04-04 DIAGNOSIS — G20 Parkinson's disease: Secondary | ICD-10-CM | POA: Diagnosis not present

## 2022-04-04 DIAGNOSIS — T8189XA Other complications of procedures, not elsewhere classified, initial encounter: Secondary | ICD-10-CM | POA: Diagnosis not present

## 2022-04-04 DIAGNOSIS — F419 Anxiety disorder, unspecified: Secondary | ICD-10-CM

## 2022-04-04 DIAGNOSIS — L98498 Non-pressure chronic ulcer of skin of other sites with other specified severity: Secondary | ICD-10-CM | POA: Diagnosis not present

## 2022-04-04 NOTE — Chronic Care Management (AMB) (Unsigned)
    Clinical Social Work  Chronic Care Management   Phone Outreach    04/04/2022 Name: [name] [name] MRN: [id number] DOB: [birth year]  [name] [name] is a 71 y.o. year old female who is a primary care patient of Vivi Barrack, MD .   Reason for referral: Level of Care Concerns and mental health support.    F/U phone call today to assess needs, progress and barriers with care plan goals.   Patient's daughter [name] [name] has returned to work and unable to keep phone appointment today, requested to reschedule but unable to identify a day and time right now.   Reports patient is still recovering so she will continue to be the contact person.  Assessment:  previous encounters with daughter have been nonproductive due to caregiver wanting to focus patient's physical health needs. Please place new referral if caregiver would like to engage in CCM services with LCSW in the future.   No Follow up Scheduled:  LCSW will disconnect from care team after this encounter.   Review of patient status, including review of consultants reports, relevant laboratory and other test results, and collaboration with appropriate care team members and the patient's provider was performed as part of comprehensive patient evaluation and provision of care management services.     Casimer Lanius, LCSW Licensed Clinical Social Worker [facility]  [facility] [place] [phone]

## 2022-04-04 NOTE — Telephone Encounter (Signed)
Caller states that appointment scheduled with pt today (04/04/22) is being postponed again at the request of the daughter/care giver.  Caller states she needs updated verbal orders.     Marland Kitchen.Home Health verbal orders Caller Name: Person Name: [name] [name] number: [phone]   Requesting OT/PT/Skilled nursing/Social Work/Speech: social wok   Reason:  requesting social work subsequent visit for next week.   Frequency: N/a

## 2022-04-04 NOTE — Telephone Encounter (Signed)
Order was placed in box to be fax

## 2022-04-04 NOTE — Telephone Encounter (Signed)
Would like to know if POC [id number], medical social worker eval # [phone] and medical social worker eval # [phone] was received?  Is requesting call back in regard.  Will fax all orders again today.  Please fax orders back to [phone].

## 2022-04-04 NOTE — Progress Notes (Signed)
[name] [name], [name] [name] ([id number]) Visit Report for 04/04/2022 Arrival Information Details Patient Name: Date of Service: [name] [name] 04/04/2022 2:30 PM Medical Record Number: [id number] Patient Account Number: [id number] Date of Birth/Sex: Treating RN: 13-Feb-1951 (71 y.o. Nancy Fetter Primary Care Waverly Tarquinio: Dimas Chyle Other Clinician: Referring Oletta Buehring: Treating Geanna Divirgilio/Extender: Randalyn Rhea in Treatment: 3 Visit Information History Since Last Visit Added or deleted any medications: No Patient Arrived: [name] [name] Any new allergies or adverse reactions: No Arrival Time: 14:47 Had a fall or experienced change in No Accompanied By: daughter activities of daily living that may affect Transfer Assistance: None risk of falls: Patient Identification Verified: Yes Signs or symptoms of abuse/neglect since last visito No Secondary Verification Process Completed: Yes Hospitalized since last visit: No Patient Requires Transmission-Based Precautions: No Implantable device outside of the clinic excluding No Patient Has Alerts: No cellular tissue based products placed in the center since last visit: Has Dressing in Place as Prescribed: Yes Pain Present Now: Yes Electronic Signature(s) Signed: 04/04/2022 5:12:12 PM By: Levan Hurst RN, BSN Entered By: Levan Hurst on 04/04/2022 14:48:07 -------------------------------------------------------------------------------- Clinic Level of Care Assessment Details Patient Name: Date of Service: [name] [name] 04/04/2022 2:30 PM Medical Record Number: [id number] Patient Account Number: [id number] Date of Birth/Sex: Treating RN: 08/10/1951 (71 y.o. [name] [name] Primary Care Jeffrie Stander: Dimas Chyle Other Clinician: Referring Valmai Vandenberghe: Treating Aundraya Dripps/Extender: Randalyn Rhea in Treatment: 3 Clinic Level of Care Assessment Items TOOL 4 Quantity Score X- 1 0 Use when only an EandM is  performed on FOLLOW-UP visit ASSESSMENTS - Nursing Assessment / Reassessment X- 1 10 Reassessment of Co-morbidities (includes updates in patient status) X- 1 5 Reassessment of Adherence to Treatment Plan ASSESSMENTS - Wound and Skin A ssessment / Reassessment X - Simple Wound Assessment / Reassessment - one wound 1 5 '[]'$  - 0 Complex Wound Assessment / Reassessment - multiple wounds '[]'$  - 0 Dermatologic / Skin Assessment (not related to wound area) ASSESSMENTS - Focused Assessment '[]'$  - 0 Circumferential Edema Measurements - multi extremities '[]'$  - 0 Nutritional Assessment / Counseling / Intervention '[]'$  - 0 Lower Extremity Assessment (monofilament, tuning fork, pulses) '[]'$  - 0 Peripheral Arterial Disease Assessment (using hand held doppler) ASSESSMENTS - Ostomy and/or Continence Assessment and Care '[]'$  - 0 Incontinence Assessment and Management '[]'$  - 0 Ostomy Care Assessment and Management (repouching, etc.) PROCESS - Coordination of Care X - Simple Patient / Family Education for ongoing care 1 15 '[]'$  - 0 Complex (extensive) Patient / Family Education for ongoing care X- 1 10 Staff obtains Programmer, systems, Records, T Results / Process Orders est X- 1 10 Staff telephones HHA, Nursing Homes / Clarify orders / etc '[]'$  - 0 Routine Transfer to another Facility (non-emergent condition) '[]'$  - 0 Routine Hospital Admission (non-emergent condition) X- 1 15 New Admissions / Biomedical engineer / Ordering NPWT Apligraf, etc. , '[]'$  - 0 Emergency Hospital Admission (emergent condition) '[]'$  - 0 Simple Discharge Coordination '[]'$  - 0 Complex (extensive) Discharge Coordination PROCESS - Special Needs '[]'$  - 0 Pediatric / Minor Patient Management '[]'$  - 0 Isolation Patient Management '[]'$  - 0 Hearing / Language / Visual special needs '[]'$  - 0 Assessment of Community assistance (transportation, D/C planning, etc.) '[]'$  - 0 Additional assistance / Altered mentation '[]'$  - 0 Support Surface(s) Assessment  (bed, cushion, seat, etc.) INTERVENTIONS - Wound Cleansing / Measurement '[]'$  - 0 Simple Wound Cleansing - one wound X- 2 5 Complex Wound Cleansing - multiple wounds X- 1 5  Wound Imaging (photographs - any number of wounds) '[]'$  - 0 Wound Tracing (instead of photographs) X- 1 5 Simple Wound Measurement - one wound '[]'$  - 0 Complex Wound Measurement - multiple wounds INTERVENTIONS - Wound Dressings X - Small Wound Dressing one or multiple wounds 1 10 '[]'$  - 0 Medium Wound Dressing one or multiple wounds '[]'$  - 0 Large Wound Dressing one or multiple wounds '[]'$  - 0 Application of Medications - topical '[]'$  - 0 Application of Medications - injection INTERVENTIONS - Miscellaneous '[]'$  - 0 External ear exam '[]'$  - 0 Specimen Collection (cultures, biopsies, blood, body fluids, etc.) '[]'$  - 0 Specimen(s) / Culture(s) sent or taken to Lab for analysis '[]'$  - 0 Patient Transfer (multiple staff / Civil Service fast streamer / Similar devices) '[]'$  - 0 Simple Staple / Suture removal (25 or less) '[]'$  - 0 Complex Staple / Suture removal (26 or more) '[]'$  - 0 Hypo / Hyperglycemic Management (close monitor of Blood Glucose) '[]'$  - 0 Ankle / Brachial Index (ABI) - do not check if billed separately X- 1 5 Vital Signs Has the patient been seen at the hospital within the last three years: Yes Total Score: 105 Level Of Care: New/Established - Level 3 Electronic Signature(s) Signed: 04/04/2022 7:34:10 PM By: Dellie Catholic RN Entered By: Dellie Catholic on 04/04/2022 19:24:27 -------------------------------------------------------------------------------- Encounter Discharge Information Details Patient Name: Date of Service: [name] [name] 04/04/2022 2:30 PM Medical Record Number: [id number] Patient Account Number: [id number] Date of Birth/Sex: Treating RN: June 25, 1951 (71 y.o. [name] [name] Primary Care Ryann Pauli: Dimas Chyle Other Clinician: Referring Jazia Faraci: Treating Jaycee Pelzer/Extender: Randalyn Rhea in Treatment: 3 Encounter Discharge Information Items Discharge Condition: Stable Ambulatory Status: Walker Discharge Destination: Home Transportation: Private Auto Accompanied By: daughter Schedule Follow-up Appointment: Yes Clinical Summary of Care: Patient Declined Electronic Signature(s) Signed: 04/04/2022 7:34:10 PM By: Dellie Catholic RN Entered By: Dellie Catholic on 04/04/2022 19:25:09 -------------------------------------------------------------------------------- Lower Extremity Assessment Details Patient Name: Date of Service: BREEANN, REPOSA 04/04/2022 2:30 PM Medical Record Number: [id number] Patient Account Number: [id number] Date of Birth/Sex: Treating RN: 24-Mar-1951 (71 y.o. [name] [name] Primary Care Orville Widmann: Dimas Chyle Other Clinician: Referring Achilles Neville: Treating Aaren Atallah/Extender: Randalyn Rhea in Treatment: 3 Electronic Signature(s) Signed: 04/04/2022 7:34:10 PM By: Dellie Catholic RN Entered By: Dellie Catholic on 04/04/2022 15:19:06 -------------------------------------------------------------------------------- Multi Wound Chart Details Patient Name: Date of Service: [name] [name] 04/04/2022 2:30 PM Medical Record Number: [id number] Patient Account Number: [id number] Date of Birth/Sex: Treating RN: 01/19/1951 (71 y.o. [name] [name] Primary Care Ramey Schiff: Dimas Chyle Other Clinician: Referring Obi Scrima: Treating Havanna Groner/Extender: Randalyn Rhea in Treatment: 3 Vital Signs Height(in): 67 Pulse(bpm): 91 Weight(lbs): 106 Blood Pressure(mmHg): 128/84 Body Mass Index(BMI): 16.6 Temperature(F): 97.4 Respiratory Rate(breaths/min): 16 Photos: [N/A:N/A] Left, Distal Abdomen - Lower Quadrant Sacrum N/A Wound Location: Surgical Injury Pressure Injury N/A Wounding Event: Dehisced Wound Pressure Ulcer N/A Primary Etiology: Cataracts, Osteoarthritis Cataracts, Osteoarthritis  N/A Comorbid History: 02/17/2022 02/17/2022 N/A Date Acquired: 3 3 N/A Weeks of Treatment: Healed - Epithelialized Open N/A Wound Status: No No N/A Wound Recurrence: 0x0x0 2x2x2.3 N/A Measurements L x W x D (cm) 0 3.142 N/A A (cm) : rea 0 7.226 N/A Volume (cm) : 100.00% 52.40% N/A % Reduction in A rea: 100.00% 65.80% N/A % Reduction in Volume: 10 Starting Position 1 (o'clock): 3 Ending Position 1 (o'clock): 2.4 Maximum Distance 1 (cm): No Yes N/A Undermining: Full Thickness Without Exposed Category/Stage III N/A Classification: Support Structures None Present Medium N/A  Exudate Amount: N/A Serosanguineous N/A Exudate Type: N/A red, [name] N/A Exudate Color: Flat and Intact Well defined, not attached N/A Wound Margin: None Present (0%) Large (67-100%) N/A Granulation Amount: N/A Red, Pink N/A Granulation Quality: None Present (0%) Small (1-33%) N/A Necrotic Amount: Fascia: No Fat Layer (Subcutaneous Tissue): Yes N/A Exposed Structures: Fat Layer (Subcutaneous Tissue): No Fascia: No Tendon: No Tendon: No Muscle: No Muscle: No Joint: No Joint: No Bone: No Bone: No Large (67-100%) Small (1-33%) N/A Epithelialization: Treatment Notes Electronic Signature(s) Signed: 04/04/2022 3:37:19 PM By: Fredirick Maudlin MD FACS Signed: 04/04/2022 7:34:10 PM By: Dellie Catholic RN Entered By: Fredirick Maudlin on 04/04/2022 15:37:19 -------------------------------------------------------------------------------- Multi-Disciplinary Care Plan Details Patient Name: Date of Service: [name] [name] 04/04/2022 2:30 PM Medical Record Number: [id number] Patient Account Number: [id number] Date of Birth/Sex: Treating RN: October 21, 1951 (71 y.o. Nancy Fetter Primary Care Bethannie Iglehart: Other Clinician: Dimas Chyle Referring Jniyah Dantuono: Treating Mitsue Peery/Extender: Randalyn Rhea in Treatment: 3 Multidisciplinary Care Plan reviewed with physician Active  Inactive Abuse / Safety / Falls / Self Care Management Nursing Diagnoses: Potential for falls Potential for injury related to falls Goals: Patient will not experience any injury related to falls Date Initiated: 03/12/2022 Target Resolution Date: 04/12/2022 Goal Status: Active Patient/caregiver will verbalize/demonstrate measures taken to prevent injury and/or falls Date Initiated: 03/12/2022 Target Resolution Date: 04/12/2022 Goal Status: Active Interventions: Assess Activities of Daily Living upon admission and as needed Assess fall risk on admission and as needed Assess: immobility, friction, shearing, incontinence upon admission and as needed Assess impairment of mobility on admission and as needed per policy Assess personal safety and home safety (as indicated) on admission and as needed Assess self care needs on admission and as needed Provide education on fall prevention Provide education on personal and home safety Notes: Pressure Nursing Diagnoses: Knowledge deficit related to causes and risk factors for pressure ulcer development Knowledge deficit related to management of pressures ulcers Potential for impaired tissue integrity related to pressure, friction, moisture, and shear Goals: Patient/caregiver will verbalize risk factors for pressure ulcer development Date Initiated: 03/12/2022 Target Resolution Date: 04/12/2022 Goal Status: Active Patient/caregiver will verbalize understanding of pressure ulcer management Date Initiated: 03/12/2022 Target Resolution Date: 04/12/2022 Goal Status: Active Interventions: Assess: immobility, friction, shearing, incontinence upon admission and as needed Assess offloading mechanisms upon admission and as needed Assess potential for pressure ulcer upon admission and as needed Provide education on pressure ulcers Notes: Wound/Skin Impairment Nursing Diagnoses: Impaired tissue integrity Knowledge deficit related to  ulceration/compromised skin integrity Goals: Patient/caregiver will verbalize understanding of skin care regimen Date Initiated: 03/12/2022 Target Resolution Date: 04/12/2022 Goal Status: Active Interventions: Assess patient/caregiver ability to obtain necessary supplies Assess patient/caregiver ability to perform ulcer/skin care regimen upon admission and as needed Assess ulceration(s) every visit Provide education on ulcer and skin care Notes: Electronic Signature(s) Signed: 04/04/2022 5:12:12 PM By: Levan Hurst RN, BSN Entered By: Levan Hurst on 04/04/2022 15:06:37 -------------------------------------------------------------------------------- Pain Assessment Details Patient Name: Date of Service: [name] [name] 04/04/2022 2:30 PM Medical Record Number: [id number] Patient Account Number: [id number] Date of Birth/Sex: Treating RN: [birth year] (71 y.o. Nancy Fetter Primary Care Shawndell Schillaci: Dimas Chyle Other Clinician: Referring Beulah Matusek: Treating Samona Chihuahua/Extender: Randalyn Rhea in Treatment: 3 Active Problems Location of Pain Severity and Description of Pain Patient Has Paino Yes Site Locations Pain Location: Pain in Ulcers With Dressing Change: Yes Rate the pain. Current Pain Level: 5 Character of Pain Describe the Pain: Shooting Pain Management and Medication Current  Pain Management: Medication: Yes Cold Application: No Rest: No Massage: No Activity: No T.E.N.S.: No Heat Application: No Leg drop or elevation: No Is the Current Pain Management Adequate: Adequate How does your wound impact your activities of daily livingo Sleep: No Bathing: No Appetite: No Relationship With Others: No Bladder Continence: No Emotions: No Bowel Continence: No Work: No Toileting: No Drive: No Dressing: No Hobbies: No Electronic Signature(s) Signed: 04/04/2022 5:12:12 PM By: Levan Hurst RN, BSN Entered By: Levan Hurst on 04/04/2022  14:51:21 -------------------------------------------------------------------------------- Patient/Caregiver Education Details Patient Name: Date of Service: [name] [name] 6/8/2023andnbsp2:30 PM Medical Record Number: [id number] Patient Account Number: [id number] Date of Birth/Gender: Treating RN: October 10, 1951 (71 y.o. Nancy Fetter Primary Care Physician: Dimas Chyle Other Clinician: Referring Physician: Treating Physician/Extender: Randalyn Rhea in Treatment: 3 Education Assessment Education Provided To: Patient Education Topics Provided Wound/Skin Impairment: Methods: Explain/Verbal Responses: State content correctly Electronic Signature(s) Signed: 04/04/2022 5:12:12 PM By: Levan Hurst RN, BSN Entered By: Levan Hurst on 04/04/2022 15:06:47 -------------------------------------------------------------------------------- Wound Assessment Details Patient Name: Date of Service: [name] [name] 04/04/2022 2:30 PM Medical Record Number: [id number] Patient Account Number: [id number] Date of Birth/Sex: Treating RN: 11-02-1950 (71 y.o. Nancy Fetter Primary Care Najeh Credit: Dimas Chyle Other Clinician: Referring Dior Dominik: Treating Galdino Hinchman/Extender: Randalyn Rhea in Treatment: 3 Wound Status Wound Number: 1 Primary Etiology: Dehisced Wound Wound Location: Left, Distal Abdomen - Lower Quadrant Wound Status: Healed - Epithelialized Wounding Event: Surgical Injury Comorbid History: Cataracts, Osteoarthritis Date Acquired: 02/17/2022 Weeks Of Treatment: 3 Clustered Wound: No Photos Wound Measurements Length: (cm) Width: (cm) Depth: (cm) Area: (cm) Volume: (cm) 0 % Reduction in Area: 100% 0 % Reduction in Volume: 100% 0 Epithelialization: Large (67-100%) 0 Tunneling: No 0 Undermining: No Wound Description Classification: Full Thickness Without Exposed Support Structures Wound Margin: Flat and  Intact Exudate Amount: None Present Foul Odor After Cleansing: No Slough/Fibrino No Wound Bed Granulation Amount: None Present (0%) Exposed Structure Necrotic Amount: None Present (0%) Fascia Exposed: No Fat Layer (Subcutaneous Tissue) Exposed: No Tendon Exposed: No Muscle Exposed: No Joint Exposed: No Bone Exposed: No Electronic Signature(s) Signed: 04/04/2022 5:12:12 PM By: Levan Hurst RN, BSN Entered By: Levan Hurst on 04/04/2022 14:57:29 -------------------------------------------------------------------------------- Wound Assessment Details Patient Name: Date of Service: [name] [name] 04/04/2022 2:30 PM Medical Record Number: [id number] Patient Account Number: [id number] Date of Birth/Sex: Treating RN: 21-Jan-1951 (71 y.o. Nancy Fetter Primary Care Nikeia Henkes: Dimas Chyle Other Clinician: Referring Emmette Katt: Treating Liyanna Cartwright/Extender: Randalyn Rhea in Treatment: 3 Wound Status Wound Number: 2 Primary Etiology: Pressure Ulcer Wound Location: Sacrum Wound Status: Open Wounding Event: Pressure Injury Comorbid History: Cataracts, Osteoarthritis Date Acquired: 02/17/2022 Weeks Of Treatment: 3 Clustered Wound: No Photos Wound Measurements Length: (cm) 2 Width: (cm) 2 Depth: (cm) 2.3 Area: (cm) 3.142 Volume: (cm) 7.226 % Reduction in Area: 52.4% % Reduction in Volume: 65.8% Epithelialization: Small (1-33%) Tunneling: No Undermining: Yes Starting Position (o'clock): 10 Ending Position (o'clock): 3 Maximum Distance: (cm) 2.4 Wound Description Classification: Category/Stage III Wound Margin: Well defined, not attached Exudate Amount: Medium Exudate Type: Serosanguineous Exudate Color: red, [name] Foul Odor After Cleansing: No Slough/Fibrino Yes Wound Bed Granulation Amount: Large (67-100%) Exposed Structure Granulation Quality: Red, Pink Fascia Exposed: No Necrotic Amount: Small (1-33%) Fat Layer (Subcutaneous Tissue)  Exposed: Yes Necrotic Quality: Adherent Slough Tendon Exposed: No Muscle Exposed: No Joint Exposed: No Bone Exposed: No Treatment Notes Wound #2 (Sacrum) Cleanser Soap and Water Discharge Instruction: May shower and wash wound  with dial antibacterial soap and water prior to dressing change. Wound Cleanser Discharge Instruction: Cleanse the wound with wound cleanser prior to applying a clean dressing using gauze sponges, not tissue or cotton balls. Peri-Wound Care Skin Prep Discharge Instruction: Use skin prep as directed Topical Primary Dressing Dakin's Solution 0.25%, 16 (oz) Discharge Instruction: Moisten gauze with Dakin's solution Secondary Dressing ALLEVYN Gentle Border, 4x4 (in/in) Discharge Instruction: Apply over primary dressing as directed. Secured With Compression Wrap Compression Stockings Environmental education officer) Signed: 04/04/2022 5:12:12 PM By: Levan Hurst RN, BSN Entered By: Levan Hurst on 04/04/2022 15:05:23 -------------------------------------------------------------------------------- Vitals Details Patient Name: Date of Service: [name] [name] 04/04/2022 2:30 PM Medical Record Number: [id number] Patient Account Number: [id number] Date of Birth/Sex: Treating RN: 05/14/1951 (71 y.o. Nancy Fetter Primary Care Charli Halle: Dimas Chyle Other Clinician: Referring Parrish Bonn: Treating Azazel Franze/Extender: Randalyn Rhea in Treatment: 3 Vital Signs Time Taken: 14:49 Temperature (F): 97.4 Height (in): 67 Pulse (bpm): 91 Weight (lbs): 106 Respiratory Rate (breaths/min): 16 Body Mass Index (BMI): 16.6 Blood Pressure (mmHg): 128/84 Reference Range: 80 - 120 mg / dl Electronic Signature(s) Signed: 04/04/2022 5:12:12 PM By: Levan Hurst RN, BSN Entered By: Levan Hurst on 04/04/2022 14:50:00

## 2022-04-04 NOTE — Telephone Encounter (Signed)
[name] [name] from [facility] called on behalf of pt who was recently discharged from rehab. States she is inquiring if pt can continue palliative services at home. Langley Gauss can be reached at [facility]. Please Advise.

## 2022-04-05 ENCOUNTER — Telehealth: Payer: Self-pay | Admitting: Family Medicine

## 2022-04-05 NOTE — Telephone Encounter (Signed)
Form sign and faxed.

## 2022-04-05 NOTE — Progress Notes (Signed)
[name] [name] ([id number]) Visit Report for 04/04/2022 Chief Complaint Document Details Patient Name: Date of Service: [name] [name], [name] [name] 04/04/2022 2:30 PM Medical Record Number: [id number] Patient Account Number: [id number] Date of Birth/Sex: Treating RN: 11/20/1950 (71 y.o. [name] Primary Care Provider: Dimas Chyle Other Clinician: Referring Provider: Treating Provider/Extender: Randalyn Rhea in Treatment: 3 Information Obtained from: Patient Chief Complaint The patient presents to the wound care center today for evaluation of a stage III sacral pressure ulcer as well as a dehisced surgical wound from an emergent femoral hernia repair. Electronic Signature(s) Signed: 04/04/2022 3:37:27 PM By: Fredirick Maudlin MD FACS Entered By: Fredirick Maudlin on 04/04/2022 15:37:27 -------------------------------------------------------------------------------- HPI Details Patient Name: Date of Service: [name] [name] 04/04/2022 2:30 PM Medical Record Number: [id number] Patient Account Number: [id number] Date of Birth/Sex: Treating RN: October 19, 1951 (71 y.o. [name] Primary Care Provider: Dimas Chyle Other Clinician: Referring Provider: Treating Provider/Extender: Randalyn Rhea in Treatment: 3 History of Present Illness HPI Description: ADMISSION 03/12/2022 This is a 72 year old woman who underwent an emergency femoral hernia repair secondary to strangulated small bowel. She required a bowel resection and reanastomosis. While she was in the hospital, the wound dehisced and she was initially felt to have an enterocutaneous fistula. They were managing the effluent with an ostomy bag. She saw her general surgeon on May 11. At that time, there was no evidence of EC fistula and the wound was closing in nicely. This is currently being managed with silver alginate. Also during her hospitalization, she unfortunately developed a pressure  ulcer. This is currently stage III. She is in a skilled nursing facility at the moment but will be discharged on Sunday. This wound is being managed with Dakin's wet to dry dressing changes. She is not a diabetic, nor does she smoke. Her only real medical history is Parkinson's disease and fairly significant protein-calorie malnutrition. 04/04/2022: The inguinal wound is closed. The sacral wound is a little bit smaller today. It is clean without significant slough or odor. It continues to undermine for about 4 to 5 cm in the cranial direction. Electronic Signature(s) Signed: 04/04/2022 3:38:17 PM By: Fredirick Maudlin MD FACS Entered By: Fredirick Maudlin on 04/04/2022 15:38:16 -------------------------------------------------------------------------------- Physical Exam Details Patient Name: Date of Service: [name] [name] 04/04/2022 2:30 PM Medical Record Number: [id number] Patient Account Number: [id number] Date of Birth/Sex: Treating RN: 01-17-1951 (71 y.o. [name] Primary Care Provider: Dimas Chyle Other Clinician: Referring Provider: Treating Provider/Extender: Randalyn Rhea in Treatment: 3 Constitutional . . . . No acute distress.Marland Kitchen Respiratory Normal work of breathing on room air.. Notes 04/04/2022: The inguinal wound is closed. The sacral wound is a little bit smaller today. It is clean without significant slough or odor. It continues to undermine for about 4 to 5 cm in the cranial direction. Electronic Signature(s) Signed: 04/04/2022 3:39:04 PM By: Fredirick Maudlin MD FACS Entered By: Fredirick Maudlin on 04/04/2022 15:39:03 -------------------------------------------------------------------------------- Physician Orders Details Patient Name: Date of Service: [name] [name] 04/04/2022 2:30 PM Medical Record Number: [id number] Patient Account Number: [id number] Date of Birth/Sex: Treating RN: October 23, 1951 (71 y.o. [name] Primary Care  Provider: Dimas Chyle Other Clinician: Referring Provider: Treating Provider/Extender: Randalyn Rhea in Treatment: 3 Verbal / Phone Orders: No Diagnosis Coding ICD-10 Coding Code Description L89.153 Pressure ulcer of sacral region, stage 3 G20 Parkinson's disease Follow-up Appointments ppointment in 1 week. - Dr. Celine Ahr - Room 3- Thursday 04/11/22 at 2:30 PM  Return A Bathing/ Shower/ Hygiene May shower with protection but do not get wound dressing(s) wet. Negative Presssure Wound Therapy Wound #2 Sacrum Wound Vac to wound continuously at 131m/hg pressure Black and White Foam combination Off-Loading Low air-loss mattress (Group 2) Turn and reposition every 2 hours Wound Treatment Wound #2 - Sacrum Cleanser: Soap and Water Every Other Day/15 Days Discharge Instructions: May shower and wash wound with dial antibacterial soap and water prior to dressing change. Cleanser: Wound Cleanser Every Other Day/15 Days Discharge Instructions: Cleanse the wound with wound cleanser prior to applying a clean dressing using gauze sponges, not tissue or cotton balls. Peri-Wound Care: Skin Prep Every Other Day/15 Days Discharge Instructions: Use skin prep as directed Prim Dressing: Dakin's Solution 0.25%, 16 (oz) Every Other Day/15 Days ary Discharge Instructions: Moisten gauze with Dakin's solution Secondary Dressing: ALLEVYN Gentle Border, 4x4 (in/in) Every Other Day/15 Days Discharge Instructions: Apply over primary dressing as directed. Electronic Signature(s) Signed: 04/04/2022 3:54:01 PM By: CFredirick MaudlinMD FACS Entered By: CFredirick Maudlinon 04/04/2022 15:39:18 -------------------------------------------------------------------------------- Problem List Details Patient Name: Date of Service: [name] Nims6/05/2022 2:30 PM Medical Record Number: [id number] Account Number: [id number] of Birth/Sex: Treating RN: 4January 02, 1952(71y.o. FAmerica BrownPrimary Care Provider: PDimas ChyleOther Clinician: Referring Provider: Treating Provider/Extender: CRandalyn Rheain Treatment: 3 Active Problems ICD-10 Encounter Code Description Active Date MDM Diagnosis L89.153 Pressure ulcer of sacral region, stage 3 03/12/2022 No Yes G20 Parkinson's disease 03/12/2022 No Yes Inactive Problems ICD-10 Code Description Active Date Inactive Date T81.31XA Disruption of external operation (surgical) wound, not elsewhere classified, initial 03/12/2022 03/12/2022 encounter Resolved Problems Electronic Signature(s) Signed: 04/04/2022 3:37:11 PM By: CFredirick MaudlinMD FACS Entered By: CFredirick Maudlinon 04/04/2022 15:37:11 -------------------------------------------------------------------------------- Progress Note Details Patient Name: Date of Service: [name] Nims6/05/2022 2:30 PM Medical Record Number: [id number] Account Number: [id number] of Birth/Sex: Treating RN: [birth year](71y.o. FAmerica BrownPrimary Care Provider: PDimas ChyleOther Clinician: Referring Provider: Treating Provider/Extender: CRandalyn Rheain Treatment: 3 Subjective Chief Complaint Information obtained from Patient The patient presents to the wound care center today for evaluation of a stage III sacral pressure ulcer as well as a dehisced surgical wound from an emergent femoral hernia repair. History of Present Illness (HPI) ADMISSION 03/12/2022 This is a 71year old woman who underwent an emergency femoral hernia repair secondary to strangulated small bowel. She required a bowel resection and reanastomosis. While she was in the hospital, the wound dehisced and she was initially felt to have an enterocutaneous fistula. They were managing the effluent with an ostomy bag. She saw her general surgeon on May 11. At that time, there was no evidence of EC fistula and the wound was closing in  nicely. This is currently being managed with silver alginate. Also during her hospitalization, she unfortunately developed a pressure ulcer. This is currently stage III. She is in a skilled nursing facility at the moment but will be discharged on Sunday. This wound is being managed with Dakin's wet to dry dressing changes. She is not a diabetic, nor does she smoke. Her only real medical history is Parkinson's disease and fairly significant protein-calorie malnutrition. 04/04/2022: The inguinal wound is closed. The sacral wound is a little bit smaller today. It is clean without significant slough or odor. It continues to undermine for about 4 to 5 cm in the cranial direction. Patient History Information obtained from Patient, Caregiver. Family History Cancer -  Mother,Father,Siblings, Hypertension - Siblings,Child. Social History Never smoker, Marital Status - Divorced, Alcohol Use - Never, Drug Use - No History, Caffeine Use - Moderate - Coffee, soda. Medical History Eyes Patient has history of Cataracts Musculoskeletal Patient has history of Osteoarthritis Medical A Surgical History Notes nd Gastrointestinal GERD Musculoskeletal degenerative disc disease Objective Constitutional No acute distress.. Vitals Time Taken: 2:49 PM, Height: 67 in, Weight: 106 lbs, BMI: 16.6, Temperature: 97.4 F, Pulse: 91 bpm, Respiratory Rate: 16 breaths/min, Blood Pressure: 128/84 mmHg. Respiratory Normal work of breathing on room air.. General Notes: 04/04/2022: The inguinal wound is closed. The sacral wound is a little bit smaller today. It is clean without significant slough or odor. It continues to undermine for about 4 to 5 cm in the cranial direction. Integumentary (Hair, Skin) Wound #1 status is Healed - Epithelialized. Original cause of wound was Surgical Injury. The date acquired was: 02/17/2022. The wound has been in treatment 3 weeks. The wound is located on the Left,Distal Abdomen - Lower  Quadrant. The wound measures 0cm length x 0cm width x 0cm depth; 0cm^2 area and 0cm^3 volume. There is no tunneling or undermining noted. There is a none present amount of drainage noted. The wound margin is flat and intact. There is no granulation within the wound bed. There is no necrotic tissue within the wound bed. Wound #2 status is Open. Original cause of wound was Pressure Injury. The date acquired was: 02/17/2022. The wound has been in treatment 3 weeks. The wound is located on the Sacrum. The wound measures 2cm length x 2cm width x 2.3cm depth; 3.142cm^2 area and 7.226cm^3 volume. There is Fat Layer (Subcutaneous Tissue) exposed. There is no tunneling noted, however, there is undermining starting at 10:00 and ending at 3:00 with a maximum distance of 2.4cm. There is a medium amount of serosanguineous drainage noted. The wound margin is well defined and not attached to the wound base. There is large (67- 100%) red, pink granulation within the wound bed. There is a small (1-33%) amount of necrotic tissue within the wound bed including Adherent Slough. Assessment Active Problems ICD-10 Pressure ulcer of sacral region, stage 3 Parkinson's disease Plan Follow-up Appointments: Return Appointment in 1 week. - Dr. Celine Ahr - Room 3- Thursday 04/11/22 at 2:30 PM Bathing/ Shower/ Hygiene: May shower with protection but do not get wound dressing(s) wet. Negative Presssure Wound Therapy: Wound #2 Sacrum: Wound Vac to wound continuously at 164m/hg pressure Black and White Foam combination Off-Loading: Low air-loss mattress (Group 2) Turn and reposition every 2 hours WOUND #2: - Sacrum Wound Laterality: Cleanser: Soap and Water Every Other Day/15 Days Discharge Instructions: May shower and wash wound with dial antibacterial soap and water prior to dressing change. Cleanser: Wound Cleanser Every Other Day/15 Days Discharge Instructions: Cleanse the wound with wound cleanser prior to applying a  clean dressing using gauze sponges, not tissue or cotton balls. Peri-Wound Care: Skin Prep Every Other Day/15 Days Discharge Instructions: Use skin prep as directed Prim Dressing: Dakin's Solution 0.25%, 16 (oz) Every Other Day/15 Days ary Discharge Instructions: Moisten gauze with Dakin's solution Secondary Dressing: ALLEVYN Gentle Border, 4x4 (in/in) Every Other Day/15 Days Discharge Instructions: Apply over primary dressing as directed. 04/04/2022: The inguinal wound is closed. The sacral wound is a little bit smaller today. It is clean without significant slough or odor. It continues to undermine for about 4 to 5 cm in the cranial direction. No debridement was necessary today. We will place an order for a wound  VAC. While we are awaiting its delivery, we will continue with Dakin's wet to dry dressing changes. Follow-up in 1 week. Electronic Signature(s) Signed: 04/04/2022 3:39:49 PM By: Fredirick Maudlin MD FACS Entered By: Fredirick Maudlin on 04/04/2022 15:39:48 -------------------------------------------------------------------------------- HxROS Details Patient Name: Date of Service: [name] [name] 04/04/2022 2:30 PM Medical Record Number: [id number] Patient Account Number: [id number] Date of Birth/Sex: Treating RN: 04-12-1951 (71 y.o. [name] Primary Care Provider: Dimas Chyle Other Clinician: Referring Provider: Treating Provider/Extender: Randalyn Rhea in Treatment: 3 Information Obtained From Patient Caregiver Eyes Medical History: Positive for: Cataracts Gastrointestinal Medical History: Past Medical History Notes: GERD Musculoskeletal Medical History: Positive for: Osteoarthritis Past Medical History Notes: degenerative disc disease HBO Extended History Items Eyes: Cataracts Immunizations Pneumococcal Vaccine: Received Pneumococcal Vaccination: Yes Received Pneumococcal Vaccination On or After 60th Birthday: No Implantable  Devices None Family and Social History Cancer: Yes - Mother,Father,Siblings; Hypertension: Yes - Siblings,Child; Never smoker; Marital Status - Divorced; Alcohol Use: Never; Drug Use: No History; Caffeine Use: Moderate - Coffee, soda; Financial Concerns: No; Food, Clothing or Shelter Needs: No; Support System Lacking: No; Transportation Concerns: No Engineer, maintenance) Signed: 04/04/2022 3:54:01 PM By: Fredirick Maudlin MD FACS Signed: 04/04/2022 7:34:10 PM By: Dellie Catholic RN Entered By: Fredirick Maudlin on 04/04/2022 15:38:21 -------------------------------------------------------------------------------- SuperBill Details Patient Name: Date of Service: [name] [name] 04/04/2022 Medical Record Number: [id number] Patient Account Number: [id number] Date of Birth/Sex: Treating RN: [birth year] (71 y.o. [name] Primary Care Provider: Dimas Chyle Other Clinician: Referring Provider: Treating Provider/Extender: Randalyn Rhea in Treatment: 3 Diagnosis Coding ICD-10 Codes Code Description [phone] Pressure ulcer of sacral region, stage 3 G20 Parkinson's disease Facility Procedures CPT4 Code: [id number] Description: 99213 - WOUND CARE VISIT-LEV 3 EST PT Modifier: Quantity: 1 Physician Procedures : CPT4 Code Description Modifier [id number] - WC PHYS LEVEL 3 - EST PT ICD-10 Diagnosis Description L89.153 Pressure ulcer of sacral region, stage 3 G20 Parkinson's disease Quantity: 1 Electronic Signature(s) Signed: 04/04/2022 7:34:10 PM By: Dellie Catholic RN Signed: 04/05/2022 7:29:40 AM By: Fredirick Maudlin MD FACS Previous Signature: 04/04/2022 3:39:58 PM Version By: Fredirick Maudlin MD FACS Entered By: Dellie Catholic on 04/04/2022 19:24:35

## 2022-04-05 NOTE — Telephone Encounter (Signed)
VO left on voice message #[phone]

## 2022-04-05 NOTE — Telephone Encounter (Signed)
..  Home Health Certification or Plan of Care Tracking  Is this a Certification or Plan of Care?Yes  [place]: [place]  Order Number:  [id number]  Has charge sheet been attached? Yes  Where has form been placed:  Provider's Box  Faxed to:   [phone]

## 2022-04-09 ENCOUNTER — Telehealth: Payer: Self-pay | Admitting: Family Medicine

## 2022-04-09 NOTE — Telephone Encounter (Signed)
[name] [name] with [facility] returned your call and would like a call back. [phone], option #2

## 2022-04-09 NOTE — Telephone Encounter (Signed)
See previews note 

## 2022-04-09 NOTE — Telephone Encounter (Signed)
Left message to return call to our office at their convenience.  

## 2022-04-09 NOTE — Telephone Encounter (Signed)
Please Advise

## 2022-04-09 NOTE — Telephone Encounter (Signed)
Spoke with Langley Gauss from [place]  Requesting VO to continue Palliative service VO given

## 2022-04-11 ENCOUNTER — Encounter (HOSPITAL_BASED_OUTPATIENT_CLINIC_OR_DEPARTMENT_OTHER): Payer: Medicare HMO | Admitting: General Surgery

## 2022-04-11 DIAGNOSIS — L89153 Pressure ulcer of sacral region, stage 3: Secondary | ICD-10-CM | POA: Diagnosis not present

## 2022-04-11 DIAGNOSIS — M1990 Unspecified osteoarthritis, unspecified site: Secondary | ICD-10-CM | POA: Diagnosis not present

## 2022-04-11 DIAGNOSIS — G20 Parkinson's disease: Secondary | ICD-10-CM | POA: Diagnosis not present

## 2022-04-11 DIAGNOSIS — K219 Gastro-esophageal reflux disease without esophagitis: Secondary | ICD-10-CM | POA: Diagnosis not present

## 2022-04-11 NOTE — Progress Notes (Addendum)
IVI, GRIFFITH ([id number]) Visit Report for 04/11/2022 Chief Complaint Document Details Patient Name: Date of Service: [name], [name] 04/11/2022 2:30 PM Medical Record Number: [id number] Patient Account Number: [id number] Date of Birth/Sex: Treating RN: Aug 21, 1951 (71 y.o. [name] [name] Primary Care Provider: Dimas Chyle Other Clinician: Referring Provider: Treating Provider/Extender: Randalyn Rhea in Treatment: 4 Information Obtained from: Patient Chief Complaint The patient presents to the wound care center today for evaluation of a stage III sacral pressure ulcer as well as a dehisced surgical wound from an emergent femoral hernia repair. Electronic Signature(s) Signed: 04/11/2022 3:27:54 PM By: Fredirick Maudlin MD FACS Entered By: Fredirick Maudlin on 04/11/2022 15:27:54 -------------------------------------------------------------------------------- HPI Details Patient Name: Date of Service: [name] [name] 04/11/2022 2:30 PM Medical Record Number: [id number] Patient Account Number: [id number] Date of Birth/Sex: Treating RN: [birth year] (71 y.o. [name] [name] Primary Care Provider: Dimas Chyle Other Clinician: Referring Provider: Treating Provider/Extender: Randalyn Rhea in Treatment: 4 History of Present Illness HPI Description: ADMISSION 03/12/2022 This is a 71 year old woman who underwent an emergency femoral hernia repair secondary to strangulated small bowel. She required a bowel resection and reanastomosis. While she was in the hospital, the wound dehisced and she was initially felt to have an enterocutaneous fistula. They were managing the effluent with an ostomy bag. She saw her general surgeon on May 11. At that time, there was no evidence of EC fistula and the wound was closing in nicely. This is currently being managed with silver alginate. Also during her hospitalization, she unfortunately developed a  pressure ulcer. This is currently stage III. She is in a skilled nursing facility at the moment but will be discharged on Sunday. This wound is being managed with Dakin's wet to dry dressing changes. She is not a diabetic, nor does she smoke. Her only real medical history is Parkinson's disease and fairly significant protein-calorie malnutrition. 04/04/2022: The inguinal wound is closed. The sacral wound is a little bit smaller today. It is clean without significant slough or odor. It continues to undermine for about 4 to 5 cm in the cranial direction. 04/11/2022: The orifice of her sacral wound continues to contract. She still has extensive undermining present. The wound is clean without slough or odor. She did not receive a wound VAC yet because her insurance has a contract with a different provider than the one we usually use. Electronic Signature(s) Signed: 04/11/2022 3:29:03 PM By: Fredirick Maudlin MD FACS Entered By: Fredirick Maudlin on 04/11/2022 15:29:03 -------------------------------------------------------------------------------- Physical Exam Details Patient Name: Date of Service: [name], [name] 04/11/2022 2:30 PM Medical Record Number: [id number] Patient Account Number: [id number] Date of Birth/Sex: Treating RN: 1951-01-05 (71 y.o. [name] [name] Primary Care Provider: Dimas Chyle Other Clinician: Referring Provider: Treating Provider/Extender: Randalyn Rhea in Treatment: 4 Constitutional . . . . No acute distress.Marland Kitchen Respiratory Normal work of breathing on room air.. Notes 04/11/2022: The orifice of her sacral wound continues to contract. She still has extensive undermining present. The wound is clean without slough or odor. Electronic Signature(s) Signed: 04/11/2022 3:29:30 PM By: Fredirick Maudlin MD FACS Entered By: Fredirick Maudlin on 04/11/2022 15:29:30 -------------------------------------------------------------------------------- Physician  Orders Details Patient Name: Date of Service: [name] [name] 04/11/2022 2:30 PM Medical Record Number: [id number] Patient Account Number: [id number] Date of Birth/Sex: Treating RN: 1951/02/24 (71 y.o. [name] [name] Primary Care Provider: Dimas Chyle Other Clinician: Referring Provider: Treating Provider/Extender: Randalyn Rhea in Treatment: 4 Verbal / Phone Orders:  No Diagnosis Coding ICD-10 Coding Code Description L89.153 Pressure ulcer of sacral region, stage 3 G20 Parkinson's disease Follow-up Appointments ppointment in 1 week. - Dr. Celine Ahr - Room 3- Thursday 04/18/22 at 1:15 PM Return A Bathing/ Shower/ Hygiene May shower with protection but do not get wound dressing(s) wet. Negative Presssure Wound Therapy Wound #2 Sacrum Wound Vac to wound continuously at 136m/hg pressure - [facility] once available Black and White Foam combination - White foam into undermining Off-Loading Low air-loss mattress (Group 2) Turn and reposition every 2 hours Home Health No change in wound care orders this week; continue Home Health for wound care. May utilize formulary equivalent dressing for wound treatment orders unless otherwise specified. - [facility] to initiate once available Other [facility] - Sacrum Cleanser: Soap and Water Every Other Day/15 Days Discharge Instructions: May shower and wash wound with dial antibacterial soap and water prior to dressing change. Cleanser: Wound Cleanser Every Other Day/15 Days Discharge Instructions: Cleanse the wound with wound cleanser prior to applying a clean dressing using gauze sponges, not tissue or cotton balls. Peri-Wound Care: Skin Prep Every Other Day/15 Days Discharge Instructions: Use skin prep as directed Prim Dressing: Dakin's Solution 0.25%, 16 (oz) Every Other Day/15  Days ary Discharge Instructions: Moisten gauze with Dakin's solution Secondary Dressing: ALLEVYN Gentle Border, 4x4 (in/in) Every Other Day/15 Days Discharge Instructions: Apply over primary dressing as directed. Electronic Signature(s) Signed: 04/11/2022 3:52:52 PM By: CFredirick MaudlinMD FACS Entered By: CFredirick Maudlinon 04/11/2022 15:30:41 -------------------------------------------------------------------------------- Problem List Details Patient Name: Date of Service: GBeverlee Nims6/15/2023 2:30 PM Medical Record Number: [id number] Account Number: [id number] of Birth/Sex: Treating RN: [birth year](71y.o. FNancy FetterPrimary Care Provider: PDimas ChyleOther Clinician: Referring Provider: Treating Provider/Extender: CRandalyn Rheain Treatment: 4 Active Problems ICD-10 Encounter Code Description Active Date MDM Diagnosis L89.153 Pressure ulcer of sacral region, stage 3 03/12/2022 No Yes G20 Parkinson's disease 03/12/2022 No Yes Inactive Problems ICD-10 Code Description Active Date Inactive Date T81.31XA Disruption of external operation (surgical) wound, not elsewhere classified, initial 03/12/2022 03/12/2022 encounter Resolved Problems Electronic Signature(s) Signed: 04/11/2022 3:27:18 PM By: CFredirick MaudlinMD FACS Entered By: CFredirick Maudlinon 04/11/2022 15:27:18 -------------------------------------------------------------------------------- Progress Note Details Patient Name: Date of Service: GBeverlee Nims6/15/2023 2:30 PM Medical Record Number: [id number] Account Number: [id number] of Birth/Sex: Treating RN: [birth year](71y.o. FAmerica BrownPrimary Care Provider: PDimas ChyleOther Clinician: Referring Provider: Treating Provider/Extender: CRandalyn Rheain Treatment: 4 Subjective Chief Complaint Information obtained from Patient The patient presents to the wound  care center today for evaluation of a stage III sacral pressure ulcer as well as a dehisced surgical wound from an emergent femoral hernia repair. History of Present Illness (HPI) ADMISSION 03/12/2022 This is a 71year old woman who underwent an emergency femoral hernia repair secondary to strangulated small bowel. She required a bowel resection and reanastomosis. While she was in the hospital, the wound dehisced and she was initially felt to have an enterocutaneous fistula. They were managing the effluent with an ostomy bag. She saw her general surgeon on May 11. At that time, there was no evidence of EC fistula and the wound was closing in nicely. This is currently being managed with silver alginate. Also during her hospitalization, she unfortunately developed a pressure ulcer. This is currently stage  III. She is in a skilled nursing facility at the moment but will be discharged on Sunday. This wound is being managed with Dakin's wet to dry dressing changes. She is not a diabetic, nor does she smoke. Her only real medical history is Parkinson's disease and fairly significant protein-calorie malnutrition. 04/04/2022: The inguinal wound is closed. The sacral wound is a little bit smaller today. It is clean without significant slough or odor. It continues to undermine for about 4 to 5 cm in the cranial direction. 04/11/2022: The orifice of her sacral wound continues to contract. She still has extensive undermining present. The wound is clean without slough or odor. She did not receive a wound VAC yet because her insurance has a contract with a different provider than the one we usually use. Patient History Information obtained from Patient, Caregiver. Family History Cancer - Mother,Father,Siblings, Hypertension - Siblings,Child. Social History Never smoker, Marital Status - Divorced, Alcohol Use - Never, Drug Use - No History, Caffeine Use - Moderate - Coffee, soda. Medical History Eyes Patient has  history of Cataracts Musculoskeletal Patient has history of Osteoarthritis Medical A Surgical History Notes nd Gastrointestinal GERD Musculoskeletal degenerative disc disease Objective Constitutional No acute distress.. Vitals Time Taken: 2:56 PM, Height: 67 in, Weight: 106 lbs, BMI: 16.6, Temperature: 97.6 F, Pulse: 89 bpm, Respiratory Rate: 18 breaths/min, Blood Pressure: 116/74 mmHg. Respiratory Normal work of breathing on room air.. General Notes: 04/11/2022: The orifice of her sacral wound continues to contract. She still has extensive undermining present. The wound is clean without slough or odor. Integumentary (Hair, Skin) Wound #2 status is Open. Original cause of wound was Pressure Injury. The date acquired was: 02/17/2022. The wound has been in treatment 4 weeks. The wound is located on the Sacrum. The wound measures 1.4cm length x 1.5cm width x 1.6cm depth; 1.649cm^2 area and 2.639cm^3 volume. There is Fat Layer (Subcutaneous Tissue) exposed. There is no tunneling noted, however, there is undermining starting at 10:00 and ending at 3:00 with a maximum distance of 2.8cm. There is a medium amount of serosanguineous drainage noted. The wound margin is well defined and not attached to the wound base. There is large (67- 100%) red, pink granulation within the wound bed. There is no necrotic tissue within the wound bed. Assessment Active Problems ICD-10 Pressure ulcer of sacral region, stage 3 Parkinson's disease Plan Follow-up Appointments: Return Appointment in 1 week. - Dr. Celine Ahr - Room 3- Thursday 04/18/22 at 1:15 PM Bathing/ Shower/ Hygiene: May shower with protection but do not get wound dressing(s) wet. Negative Presssure Wound Therapy: Wound #2 Sacrum: Wound Vac to wound continuously at 151m/hg pressure - [facility] Black and White Foam combination - White foam into undermining Off-Loading: Low air-loss  mattress (Group 2) Turn and reposition every 2 hours Home Health: No change in wound care orders this week; continue Home Health for wound care. May utilize formulary equivalent dressing for wound treatment orders unless otherwise specified. - [facility] health to initiate once available Other Home Health Orders/Instructions: -[facility]: - Sacrum Wound Laterality: Cleanser: Soap and Water Every Other Day/15 Days Discharge Instructions: May shower and wash wound with dial antibacterial soap and water prior to dressing change. Cleanser: Wound Cleanser Every Other Day/15 Days Discharge Instructions: Cleanse the wound with wound cleanser prior to applying a clean dressing using gauze sponges, not tissue or cotton balls. Peri-Wound Care: Skin Prep Every Other  Day/15 Days Discharge Instructions: Use skin prep as directed Prim Dressing: Dakin's Solution 0.25%, 16 (oz) Every Other Day/15 Days ary Discharge Instructions: Moisten gauze with Dakin's solution Secondary Dressing: ALLEVYN Gentle Border, 4x4 (in/in) Every Other Day/15 Days Discharge Instructions: Apply over primary dressing as directed. 04/11/2022: The orifice of her sacral wound continues to contract. She still has extensive undermining present. The wound is clean without slough or odor. No debridement was performed today. She had some questions about the wound VAC and seemed a little bit unsure as to its benefits, but after our discussion, she indicated that she would like to proceed. Once the wound VAC has been delivered to her home, the home health providers can apply it and I will see her in clinic in 1 week. Electronic Signature(s) Signed: 04/11/2022 3:34:37 PM By: Fredirick Maudlin MD FACS Entered By: Fredirick Maudlin on 04/11/2022 15:34:36 -------------------------------------------------------------------------------- HxROS Details Patient Name: Date of Service: [name] [name] 04/11/2022  2:30 PM Medical Record Number: [id number] Patient Account Number: [id number] Date of Birth/Sex: Treating RN: 05-13-1951 (71 y.o. [name] [name] Primary Care Provider: Dimas Chyle Other Clinician: Referring Provider: Treating Provider/Extender: Randalyn Rhea in Treatment: 4 Information Obtained From Patient Caregiver Eyes Medical History: Positive for: Cataracts Gastrointestinal Medical History: Past Medical History Notes: GERD Musculoskeletal Medical History: Positive for: Osteoarthritis Past Medical History Notes: degenerative disc disease HBO Extended History Items Eyes: Cataracts Immunizations Pneumococcal Vaccine: Received Pneumococcal Vaccination: Yes Received Pneumococcal Vaccination On or After 60th Birthday: No Implantable Devices None Family and Social History Cancer: Yes - Mother,Father,Siblings; Hypertension: Yes - Siblings,Child; Never smoker; Marital Status - Divorced; Alcohol Use: Never; Drug Use: No History; Caffeine Use: Moderate - Coffee, soda; Financial Concerns: No; Food, Clothing or Shelter Needs: No; Support System Lacking: No; Transportation Concerns: No Electronic Signature(s) Signed: 04/11/2022 3:52:52 PM By: Fredirick Maudlin MD FACS Signed: 04/11/2022 6:17:15 PM By: Dellie Catholic RN Entered By: Fredirick Maudlin on 04/11/2022 15:29:08 -------------------------------------------------------------------------------- SuperBill Details Patient Name: Date of Service: [name] [name] 04/11/2022 Medical Record Number: [id number] Patient Account Number: [id number] Date of Birth/Sex: Treating RN: 1951-06-24 (71 y.o. [name] [name] Primary Care Provider: Dimas Chyle Other Clinician: Referring Provider: Treating Provider/Extender: Randalyn Rhea in Treatment: 4 Diagnosis Coding ICD-10 Codes Code Description [phone] Pressure ulcer of sacral region, stage 3 G20 Parkinson's disease Facility  Procedures Physician Procedures : CPT4 Code Description Modifier [id number] - WC PHYS LEVEL 3 - EST PT ICD-10 Diagnosis Description L89.153 Pressure ulcer of sacral region, stage 3 G20 Parkinson's disease Quantity: 1 Electronic Signature(s) Signed: 04/11/2022 5:36:06 PM By: Levan Hurst RN, BSN Signed: 04/12/2022 7:27:00 AM By: Fredirick Maudlin MD FACS Previous Signature: 04/11/2022 3:34:48 PM Version By: Fredirick Maudlin MD FACS Entered By: Levan Hurst on 04/11/2022 17:33:53

## 2022-04-11 NOTE — Progress Notes (Signed)
[name], [name] ([id number]) Visit Report for 04/11/2022 Arrival Information Details Patient Name: Date of Service: [name] [name], [name] [name] 04/11/2022 2:30 PM Medical Record Number: [id number] Patient Account Number: [id number] Date of Birth/Sex: Treating RN: [birth year] (71 y.o. [name] [name] Primary Care Elecia Serafin: Dimas Chyle Other Clinician: Referring Natalynn Pedone: Treating Maysie Parkhill/Extender: Randalyn Rhea in Treatment: 4 Visit Information History Since Last Visit Added or deleted any medications: No Patient Arrived: [name] Any new allergies or adverse reactions: No Arrival Time: 14:56 Had a fall or experienced change in No Accompanied By: daughter activities of daily living that may affect Transfer Assistance: None risk of falls: Patient Identification Verified: Yes Signs or symptoms of abuse/neglect since last visito No Secondary Verification Process Completed: Yes Hospitalized since last visit: No Patient Requires Transmission-Based Precautions: No Implantable device outside of the clinic excluding No Patient Has Alerts: No cellular tissue based products placed in the center since last visit: Has Dressing in Place as Prescribed: Yes Pain Present Now: No Electronic Signature(s) Signed: 04/11/2022 5:36:06 PM By: Levan Hurst RN, BSN Entered By: Levan Hurst on 04/11/2022 14:57:06 -------------------------------------------------------------------------------- Clinic Level of Care Assessment Details Patient Name: Date of Service: [name], [name] 04/11/2022 2:30 PM Medical Record Number: [id number] Patient Account Number: [id number] Date of Birth/Sex: Treating RN: 1951/01/08 (71 y.o. [name] [name] Primary Care Mylene Bow: Dimas Chyle Other Clinician: Referring Laken Rog: Treating Saylah Ketner/Extender: Randalyn Rhea in Treatment: 4 Clinic Level of Care Assessment Items TOOL 4 Quantity Score X- 1 0 Use when only an EandM is  performed on FOLLOW-UP visit ASSESSMENTS - Nursing Assessment / Reassessment X- 1 10 Reassessment of Co-morbidities (includes updates in patient status) X- 1 5 Reassessment of Adherence to Treatment Plan ASSESSMENTS - Wound and Skin A ssessment / Reassessment X - Simple Wound Assessment / Reassessment - one wound 1 5 '[]'$  - 0 Complex Wound Assessment / Reassessment - multiple wounds '[]'$  - 0 Dermatologic / Skin Assessment (not related to wound area) ASSESSMENTS - Focused Assessment '[]'$  - 0 Circumferential Edema Measurements - multi extremities '[]'$  - 0 Nutritional Assessment / Counseling / Intervention '[]'$  - 0 Lower Extremity Assessment (monofilament, tuning fork, pulses) '[]'$  - 0 Peripheral Arterial Disease Assessment (using hand held doppler) ASSESSMENTS - Ostomy and/or Continence Assessment and Care '[]'$  - 0 Incontinence Assessment and Management '[]'$  - 0 Ostomy Care Assessment and Management (repouching, etc.) PROCESS - Coordination of Care X - Simple Patient / Family Education for ongoing care 1 15 '[]'$  - 0 Complex (extensive) Patient / Family Education for ongoing care X- 1 10 Staff obtains Consents, Records, T Results / Process Orders est X- 1 10 Staff telephones HHA, Nursing Homes / Clarify orders / etc '[]'$  - 0 Routine Transfer to another Facility (non-emergent condition) '[]'$  - 0 Routine Hospital Admission (non-emergent condition) '[]'$  - 0 New Admissions / Biomedical engineer / Ordering NPWT Apligraf, etc. , '[]'$  - 0 Emergency Hospital Admission (emergent condition) X- 1 10 Simple Discharge Coordination '[]'$  - 0 Complex (extensive) Discharge Coordination PROCESS - Special Needs '[]'$  - 0 Pediatric / Minor Patient Management '[]'$  - 0 Isolation Patient Management '[]'$  - 0 Hearing / Language / Visual special needs '[]'$  - 0 Assessment of Community assistance (transportation, D/C planning, etc.) '[]'$  - 0 Additional assistance / Altered mentation '[]'$  - 0 Support Surface(s) Assessment  (bed, cushion, seat, etc.) INTERVENTIONS - Wound Cleansing / Measurement X - Simple Wound Cleansing - one wound 1 5 '[]'$  - 0 Complex Wound Cleansing - multiple wounds X- 1  5 Wound Imaging (photographs - any number of wounds) '[]'$  - 0 Wound Tracing (instead of photographs) X- 1 5 Simple Wound Measurement - one wound '[]'$  - 0 Complex Wound Measurement - multiple wounds INTERVENTIONS - Wound Dressings '[]'$  - 0 Small Wound Dressing one or multiple wounds '[]'$  - 0 Medium Wound Dressing one or multiple wounds '[]'$  - 0 Large Wound Dressing one or multiple wounds '[]'$  - 0 Application of Medications - topical '[]'$  - 0 Application of Medications - injection INTERVENTIONS - Miscellaneous '[]'$  - 0 External ear exam '[]'$  - 0 Specimen Collection (cultures, biopsies, blood, body fluids, etc.) '[]'$  - 0 Specimen(s) / Culture(s) sent or taken to Lab for analysis '[]'$  - 0 Patient Transfer (multiple staff / Civil Service fast streamer / Similar devices) '[]'$  - 0 Simple Staple / Suture removal (25 or less) '[]'$  - 0 Complex Staple / Suture removal (26 or more) '[]'$  - 0 Hypo / Hyperglycemic Management (close monitor of Blood Glucose) '[]'$  - 0 Ankle / Brachial Index (ABI) - do not check if billed separately X- 1 5 Vital Signs Has the patient been seen at the hospital within the last three years: Yes Total Score: 85 Level Of Care: New/Established - Level 3 Electronic Signature(s) Signed: 04/11/2022 5:36:06 PM By: Levan Hurst RN, BSN Entered By: Levan Hurst on 04/11/2022 17:33:45 -------------------------------------------------------------------------------- Encounter Discharge Information Details Patient Name: Date of Service: [name] [name] 04/11/2022 2:30 PM Medical Record Number: [id number] Patient Account Number: [id number] Date of Birth/Sex: Treating RN: 1951-08-16 (71 y.o. [name] [name] Primary Care Lella Mullany: Dimas Chyle Other Clinician: Referring Elnora Quizon: Treating Lister Brizzi/Extender: Randalyn Rhea in Treatment: 4 Encounter Discharge Information Items Discharge Condition: Stable Ambulatory Status: Walker Discharge Destination: Home Transportation: Private Auto Accompanied By: daughter Schedule Follow-up Appointment: Yes Clinical Summary of Care: Patient Declined Electronic Signature(s) Signed: 04/11/2022 5:36:06 PM By: Levan Hurst RN, BSN Entered By: Levan Hurst on 04/11/2022 17:35:13 -------------------------------------------------------------------------------- Multi Wound Chart Details Patient Name: Date of Service: [name] [name] 04/11/2022 2:30 PM Medical Record Number: [id number] Patient Account Number: [id number] Date of Birth/Sex: Treating RN: 1950/12/24 (71 y.o. [name] Primary Care Makhya Arave: Dimas Chyle Other Clinician: Referring Horton Ellithorpe: Treating Lucina Betty/Extender: Randalyn Rhea in Treatment: 4 Vital Signs Height(in): 67 Pulse(bpm): 89 Weight(lbs): 106 Blood Pressure(mmHg): 116/74 Body Mass Index(BMI): 16.6 Temperature(F): 97.6 Respiratory Rate(breaths/min): 18 Photos: [2:Sacrum] [N/A:N/A N/A] Wound Location: [2:Pressure Injury] [N/A:N/A] Wounding Event: [2:Pressure Ulcer] [N/A:N/A] Primary Etiology: [2:Cataracts, Osteoarthritis] [N/A:N/A] Comorbid History: [2:02/17/2022] [N/A:N/A] Date Acquired: [2:4] [N/A:N/A] Weeks of Treatment: [2:Open] [N/A:N/A] Wound Status: [2:No] [N/A:N/A] Wound Recurrence: [2:1.4x1.5x1.6] [N/A:N/A] Measurements L x W x D (cm) [2:1.649] [N/A:N/A] A (cm) : rea [2:2.639] [N/A:N/A] Volume (cm) : [2:75.00%] [N/A:N/A] % Reduction in A rea: [2:87.50%] [N/A:N/A] % Reduction in Volume: [2:10] Starting Position 1 (o'clock): [2:3] Ending Position 1 (o'clock): [2:2.8] Maximum Distance 1 (cm): [2:Yes] [N/A:N/A] Undermining: [2:Category/Stage III] [N/A:N/A] Classification: [2:Medium] [N/A:N/A] Exudate A mount: [2:Serosanguineous] [N/A:N/A] Exudate Type: [2:red, brown]  [N/A:N/A] Exudate Color: [2:Well defined, not attached] [N/A:N/A] Wound Margin: [2:Large (67-100%)] [N/A:N/A] Granulation A mount: [2:Red, Pink] [N/A:N/A] Granulation Quality: [2:None Present (0%)] [N/A:N/A] Necrotic A mount: [2:Fat Layer (Subcutaneous Tissue): Yes N/A] Exposed Structures: [2:Fascia: No Tendon: No Muscle: No Joint: No Bone: No Small (1-33%)] [N/A:N/A] Treatment Notes Electronic Signature(s) Signed: 04/11/2022 3:27:44 PM By: Fredirick Maudlin MD FACS Signed: 04/11/2022 6:17:15 PM By: Dellie Catholic RN Entered By: Fredirick Maudlin on 04/11/2022 15:27:44 -------------------------------------------------------------------------------- [place] Details Patient Name: Date of Service: [name] [name]. 04/11/2022 2:30  PM Medical Record Number: [id number] Patient Account Number: [id number] Date of Birth/Sex: Treating RN: Jun 13, 1951 (71 y.o. [name] [name] Primary Care Steffan Caniglia: Dimas Chyle Other Clinician: Referring Ohm Dentler: Treating Garnell Phenix/Extender: Randalyn Rhea in Treatment: 4 Multidisciplinary Care Plan reviewed with physician Active Inactive Wound/Skin Impairment Nursing Diagnoses: Impaired tissue integrity Knowledge deficit related to ulceration/compromised skin integrity Goals: Patient/caregiver will verbalize understanding of skin care regimen Date Initiated: 03/12/2022 Target Resolution Date: 05/10/2022 Goal Status: Active Interventions: Assess patient/caregiver ability to obtain necessary supplies Assess patient/caregiver ability to perform ulcer/skin care regimen upon admission and as needed Assess ulceration(s) every visit Provide education on ulcer and skin care Notes: Electronic Signature(s) Signed: 04/11/2022 5:36:06 PM By: Levan Hurst RN, BSN Entered By: Levan Hurst on 04/11/2022 15:13:06 -------------------------------------------------------------------------------- Pain Assessment  Details Patient Name: Date of Service: [name] [name] 04/11/2022 2:30 PM Medical Record Number: [id number] Patient Account Number: [id number] Date of Birth/Sex: Treating RN: Jan 06, 1951 (71 y.o. [name] [name] Primary Care Devonne Kitchen: Dimas Chyle Other Clinician: Referring Asees Manfredi: Treating Nolan Tuazon/Extender: Randalyn Rhea in Treatment: 4 Active Problems Location of Pain Severity and Description of Pain Patient Has Paino No Site Locations Pain Management and Medication Current Pain Management: Electronic Signature(s) Signed: 04/11/2022 5:36:06 PM By: Levan Hurst RN, BSN Entered By: Levan Hurst on 04/11/2022 14:58:49 -------------------------------------------------------------------------------- Patient/Caregiver Education Details Patient Name: Date of Service: [name] [name] 6/15/2023andnbsp2:30 PM Medical Record Number: [id number] Patient Account Number: [id number] Date of Birth/Gender: Treating RN: 1951-05-03 (71 y.o. [name] [name] Primary Care Physician: Dimas Chyle Other Clinician: Referring Physician: Treating Physician/Extender: Randalyn Rhea in Treatment: 4 Education Assessment Education Provided To: Patient Education Topics Provided Wound/Skin Impairment: Methods: Explain/Verbal Responses: State content correctly Electronic Signature(s) Signed: 04/11/2022 5:36:06 PM By: Levan Hurst RN, BSN Entered By: Levan Hurst on 04/11/2022 15:13:14 -------------------------------------------------------------------------------- Wound Assessment Details Patient Name: Date of Service: [name] [name] 04/11/2022 2:30 PM Medical Record Number: [id number] Patient Account Number: [id number] Date of Birth/Sex: Treating RN: [birth year] (72 y.o. [name] [name] Primary Care Aricela Bertagnolli: Dimas Chyle Other Clinician: Referring Chidi Shirer: Treating Elinore Shults/Extender: Randalyn Rhea in  Treatment: 4 Wound Status Wound Number: 2 Primary Etiology: Pressure Ulcer Wound Location: Sacrum Wound Status: Open Wounding Event: Pressure Injury Comorbid History: Cataracts, Osteoarthritis Date Acquired: 02/17/2022 Weeks Of Treatment: 4 Clustered Wound: No Photos Wound Measurements Length: (cm) 1.4 Width: (cm) 1.5 Depth: (cm) 1.6 Area: (cm) 1.649 Volume: (cm) 2.639 % Reduction in Area: 75% % Reduction in Volume: 87.5% Epithelialization: Small (1-33%) Tunneling: No Undermining: Yes Starting Position (o'clock): 10 Ending Position (o'clock): 3 Maximum Distance: (cm) 2.8 Wound Description Classification: Category/Stage III Wound Margin: Well defined, not attached Exudate Amount: Medium Exudate Type: Serosanguineous Exudate Color: red, brown Wound Bed Granulation Amount: Large (67-100%) Granulation Quality: Red, Pink Necrotic Amount: None Present (0%) Foul Odor After Cleansing: No Slough/Fibrino No Exposed Structure Fascia Exposed: No Fat Layer (Subcutaneous Tissue) Exposed: Yes Tendon Exposed: No Muscle Exposed: No Joint Exposed: No Bone Exposed: No Treatment Notes Wound #2 (Sacrum) Cleanser Soap and Water Discharge Instruction: May shower and wash wound with dial antibacterial soap and water prior to dressing change. Wound Cleanser Discharge Instruction: Cleanse the wound with wound cleanser prior to applying a clean dressing using gauze sponges, not tissue or cotton balls. Peri-Wound Care Skin Prep Discharge Instruction: Use skin prep as directed Topical Primary Dressing Dakin's Solution 0.25%, 16 (oz) Discharge Instruction: Moisten gauze with Dakin's solution Secondary Dressing ALLEVYN Gentle Border, 4x4 (in/in) Discharge Instruction: Apply over  primary dressing as directed. Secured With Compression Wrap Compression Stockings Environmental education officer) Signed: 04/11/2022 5:36:06 PM By: Levan Hurst RN, BSN Signed: 04/11/2022 6:17:15 PM By:  Dellie Catholic RN Entered By: Dellie Catholic on 04/11/2022 15:10:46 -------------------------------------------------------------------------------- Vitals Details Patient Name: Date of Service: [name] [name] 04/11/2022 2:30 PM Medical Record Number: [id number] Patient Account Number: [id number] Date of Birth/Sex: Treating RN: Nov 02, 1950 (71 y.o. [name] [name] Primary Care Aveon Colquhoun: Dimas Chyle Other Clinician: Referring Layaan Mott: Treating Macdonald Rigor/Extender: Randalyn Rhea in Treatment: 4 Vital Signs Time Taken: 14:56 Temperature (F): 97.6 Height (in): 67 Pulse (bpm): 89 Weight (lbs): 106 Respiratory Rate (breaths/min): 18 Body Mass Index (BMI): 16.6 Blood Pressure (mmHg): 116/74 Reference Range: 80 - 120 mg / dl Electronic Signature(s) Signed: 04/11/2022 5:36:06 PM By: Levan Hurst RN, BSN Entered By: Levan Hurst on 04/11/2022 14:58:44

## 2022-04-12 ENCOUNTER — Other Ambulatory Visit: Payer: Self-pay | Admitting: *Deleted

## 2022-04-12 NOTE — Telephone Encounter (Signed)
Pt requesting refills, has not been filled by you. Okay to refill prescriptions?

## 2022-04-12 NOTE — Telephone Encounter (Signed)
-----   Message from April D Calhoun sent at 04/04/2022  3:26 PM EDT ----- Regarding: Rx refill request Patient has an upcoming delivery with [facility]. She will need refills on Pantoprazole and carbidopa-levodopa 25-'250mg'$  to complete her order. Please send 90 DS to [facility].  Thank you.  April D Calhoun, [place] Pharmacist Assistant [phone]

## 2022-04-13 NOTE — Telephone Encounter (Signed)
Can we please clarify with patient? I am ok refilling both if needed but her sinemet really needs to come from a neurologist. We can place a new referral if needed but we can give a one time refill.  It is ok to refill her protonix.  [name] [name]. Jerline Pain, MD 04/13/2022 8:53 AM

## 2022-04-15 ENCOUNTER — Telehealth: Payer: Self-pay | Admitting: Family Medicine

## 2022-04-15 ENCOUNTER — Telehealth: Payer: Self-pay

## 2022-04-15 NOTE — Telephone Encounter (Signed)
Spoke with patient's daughter [name] [name] regarding Palliative Care services. She declines services at this time. She states patient is doing okay and daughter has returned to work so scheduling appointments would not be easy. Will cancel referral and notify referring provider. Daughter advised to contact [facility] or PCP if services are needed in the future.

## 2022-04-15 NOTE — Telephone Encounter (Signed)
Spoke with patient stated does not need Rx Carbidopa at this time  Will call back patient busy at this time

## 2022-04-15 NOTE — Telephone Encounter (Signed)
..  Home Health Verbal Orders  Agency:  [facility]  Caller:  Eual Fines, OT  Contact and title  Requesting OT/ PT/ Skilled nursing/ Social Work/ Speech:   OT  Reason for Request:   Therapy needed after evaluation   Frequency:   1x/week for 4 weeks

## 2022-04-16 MED ORDER — PANTOPRAZOLE SODIUM 40 MG PO TBEC: 40.0000 mg | DELAYED_RELEASE_TABLET | Freq: Every day | ORAL | 2 refills | Status: DC

## 2022-04-16 NOTE — Addendum Note (Signed)
Addended by: Marian Sorrow on: 04/16/2022 09:46 AM   Modules accepted: Orders

## 2022-04-16 NOTE — Telephone Encounter (Signed)
Spoke to pt asked her if she needed a refill on Protonix? Pt asked what is that for? Told her it is for your stomach, acid reflux. Pt verbalized understanding and said yes. Told her okay I will send refill to [facility]. I know you spoke to [facility] yesterday about your Carbidopa, do you have a Neurologist? Pt said yes, told her okay Dr. Jerline Pain said for you to contact your Neurologist when you need a refill. Pt verbalized understanding and said she just got a refill. Told her okay I will send Protonix to [facility] for you now. Pt verbalized understanding.

## 2022-04-17 ENCOUNTER — Emergency Department (HOSPITAL_COMMUNITY)
Admission: EM | Admit: 2022-04-17 | Discharge: 2022-04-17 | Disposition: A | Discharge status: Home / Self Care | Payer: Medicare HMO | Attending: Emergency Medicine | Admitting: Emergency Medicine

## 2022-04-17 ENCOUNTER — Other Ambulatory Visit: Payer: Self-pay

## 2022-04-17 ENCOUNTER — Emergency Department (HOSPITAL_COMMUNITY): Payer: Medicare HMO

## 2022-04-17 ENCOUNTER — Encounter (HOSPITAL_COMMUNITY): Payer: Self-pay

## 2022-04-17 DIAGNOSIS — R5383 Other fatigue: Secondary | ICD-10-CM | POA: Diagnosis not present

## 2022-04-17 DIAGNOSIS — R42 Dizziness and giddiness: Secondary | ICD-10-CM | POA: Insufficient documentation

## 2022-04-17 DIAGNOSIS — E162 Hypoglycemia, unspecified: Secondary | ICD-10-CM | POA: Diagnosis not present

## 2022-04-17 DIAGNOSIS — N3289 Other specified disorders of bladder: Secondary | ICD-10-CM | POA: Diagnosis not present

## 2022-04-17 DIAGNOSIS — K769 Liver disease, unspecified: Secondary | ICD-10-CM | POA: Diagnosis not present

## 2022-04-17 DIAGNOSIS — R531 Weakness: Secondary | ICD-10-CM | POA: Insufficient documentation

## 2022-04-17 DIAGNOSIS — N281 Cyst of kidney, acquired: Secondary | ICD-10-CM | POA: Diagnosis not present

## 2022-04-17 DIAGNOSIS — R1084 Generalized abdominal pain: Secondary | ICD-10-CM | POA: Insufficient documentation

## 2022-04-17 DIAGNOSIS — E161 Other hypoglycemia: Secondary | ICD-10-CM | POA: Diagnosis not present

## 2022-04-17 DIAGNOSIS — R109 Unspecified abdominal pain: Secondary | ICD-10-CM | POA: Diagnosis present

## 2022-04-17 DIAGNOSIS — T68XXXA Hypothermia, initial encounter: Secondary | ICD-10-CM | POA: Diagnosis not present

## 2022-04-17 LAB — CBC
HCT: 43.1 % (ref 36.0–46.0)
Hemoglobin: 13.5 g/dL (ref 12.0–15.0)
MCH: 28.4 pg (ref 26.0–34.0)
MCHC: 31.3 g/dL (ref 30.0–36.0)
MCV: 90.7 fL (ref 80.0–100.0)
Platelets: 171 10*3/uL (ref 150–400)
RBC: 4.75 MIL/uL (ref 3.87–5.11)
RDW: 13.7 % (ref 11.5–15.5)
WBC: 5.7 10*3/uL (ref 4.0–10.5)
nRBC: 0 % (ref 0.0–0.2)

## 2022-04-17 LAB — URINALYSIS, ROUTINE W REFLEX MICROSCOPIC
Bilirubin Urine: NEGATIVE
Glucose, UA: NEGATIVE mg/dL
Hgb urine dipstick: NEGATIVE
Ketones, ur: NEGATIVE mg/dL
Nitrite: NEGATIVE
Protein, ur: NEGATIVE mg/dL
Specific Gravity, Urine: 1.006 (ref 1.005–1.030)
pH: 7 (ref 5.0–8.0)

## 2022-04-17 LAB — COMPREHENSIVE METABOLIC PANEL
ALT: 5 U/L (ref 0–44)
AST: 15 U/L (ref 15–41)
Albumin: 3.9 g/dL (ref 3.5–5.0)
Alkaline Phosphatase: 69 U/L (ref 38–126)
Anion gap: 5 (ref 5–15)
BUN: 16 mg/dL (ref 8–23)
CO2: 28 mmol/L (ref 22–32)
Calcium: 9.7 mg/dL (ref 8.9–10.3)
Chloride: 106 mmol/L (ref 98–111)
Creatinine, Ser: 0.53 mg/dL (ref 0.44–1.00)
GFR, Estimated: >60 mL/min (ref >60)
Glucose, Bld: 107 mg/dL — ABNORMAL HIGH (ref 70–99)
Potassium: 3.9 mmol/L (ref 3.5–5.1)
Sodium: 139 mmol/L (ref 135–145)
Total Bilirubin: 0.5 mg/dL (ref 0.3–1.2)
Total Protein: 7.9 g/dL (ref 6.5–8.1)

## 2022-04-17 LAB — LIPASE, BLOOD: Lipase: 30 U/L (ref 11–51)

## 2022-04-17 MED ORDER — IOHEXOL 300 MG/ML  SOLN
100.0000 mL | Freq: Once | INTRAMUSCULAR | Status: AC | PRN
  Administered 2022-04-17: 75 mL via INTRAVENOUS

## 2022-04-17 MED ORDER — SODIUM CHLORIDE 0.9 % IV BOLUS
500.0000 mL | Freq: Once | INTRAVENOUS | Status: AC
  Administered 2022-04-17: 500 mL via INTRAVENOUS

## 2022-04-17 NOTE — Telephone Encounter (Signed)
VO given to [facility]

## 2022-04-17 NOTE — ED Notes (Signed)
Lab notified for urine culture

## 2022-04-17 NOTE — ED Provider Notes (Cosign Needed)
[facility] DEPT Provider Note   CSN: [id number] Arrival date & time: 04/17/22  1652     History  Chief Complaint  Patient presents with   Abdominal Pain   Weakness    [name] [name] is a 71 y.o. female.  Patient with history of strangulated left inguinal hernia in March 2023 status post small bowel resection, complicated hospital course including pneumonia and intubation, enterocutaneous fistula -- presents to the emergency department for evaluation of weakness and abdominal pain.  Patient states that about 5 days ago she started feeling abnormally weak.  She states that she spent much of the day in bed.  Two days ago she developed some pain in the lower abdomen.  In addition she has developed dizziness when she has a bowel movement.  She has needed a family member to be near because she is afraid she will pass out.  She is eating and drinking well.  She states that she is urinating a lot.  She denies vomiting or diarrhea.  No CP/SOB.  She was having constipation until 2 days ago when she restarted MiraLAX and the stool softener.  She states that when she originally had her hernia, she did not have much pain and she was feeling similarly, so she wanted to be checked today.       Home Medications Prior to Admission medications   Medication Sig Start Date End Date Taking? Authorizing Provider  acetaminophen (TYLENOL) 160 MG/5ML solution Take 20.3 mLs (650 mg total) by mouth every 6 (six) hours as needed for mild pain. 02/14/22   Meuth, Brooke A, PA-C  Amino Acids-Protein Hydrolys (FEEDING SUPPLEMENT, PRO-STAT 64,) LIQD Take 30 mLs by mouth 3 (three) times daily with meals. 03/19/22   Vivi Barrack, MD  Ascorbic Acid (VITAMIN C PO) Take 1 tablet by mouth daily.    [provider]  b complex vitamins capsule Take 1 capsule by mouth daily.    [provider]  carbidopa-levodopa (SINEMET IR) 25-250 MG tablet Take 1 tablet by mouth 4 (four)  times daily. 02/11/22 03/13/22  Barb Merino, MD  Cholecalciferol (VITAMIN D) 2000 UNITS CAPS Take 2,000 Units by mouth daily.    [provider]  feeding supplement (ENSURE ENLIVE / ENSURE PLUS) LIQD Take 237 mLs by mouth 3 (three) times daily between meals. 02/14/22   Meuth, Blaine Hamper, PA-C  glucosamine-chondroitin 500-400 MG tablet Take 1 tablet by mouth daily.    [provider]  guaiFENesin-dextromethorphan (ROBITUSSIN DM) 100-10 MG/5ML syrup Take 15 mLs by mouth 2 (two) times daily. 02/14/22   Meuth, Brooke A, PA-C  hydrocortisone cream 1 % Apply topically daily. 02/15/22   Meuth, Brooke A, PA-C  hydrOXYzine (ATARAX) 10 MG tablet Take 1 tablet (10 mg total) by mouth 3 (three) times daily as needed for anxiety. 03/01/22   Vivi Barrack, MD  leptospermum manuka honey (MEDIHONEY) PSTE paste Apply 1 application. topically daily. 02/15/22   Meuth, Brooke A, PA-C  lidocaine (LIDODERM) 5 % Place 1 patch onto the skin daily. Remove & Discard patch within 12 hours or as directed by MD 02/15/22   Wellington Hampshire, PA-C  Multiple Vitamin (MULTIVITAMIN) capsule Take 1 capsule by mouth daily.    [provider]  Omega-3 Fatty Acids (OMEGA 3 PO) Take 1 tablet by mouth daily.    [provider]  pantoprazole (PROTONIX) 40 MG tablet Take 1 tablet (40 mg total) by mouth daily. 04/16/22 07/15/22  Vivi Barrack,  MD  QUEtiapine (SEROQUEL) 25 MG tablet Take 1 tablet (25 mg total) by mouth at bedtime. Patient not taking: Reported on 03/06/2022 02/11/22 03/13/22  Barb Merino, MD  tetrahydrozoline-zinc (VISINE-AC) 0.05-0.25 % ophthalmic solution Place 2 drops into both eyes 4 (four) times daily as needed (allergies).    [provider]  vitamin B-12 (CYANOCOBALAMIN) 1000 MCG tablet Take 1,000 mcg by mouth daily.    [provider]  amantadine (SYMMETREL) 100 MG capsule Take 1 capsule (100 mg total) by mouth 2 (two) times daily. Patient not taking: Reported on 09/21/2019  08/05/19 02/01/20  Vivi Barrack, MD      Allergies    Sulfa antibiotics    Review of Systems   Review of Systems  Physical Exam Updated Vital Signs BP 110/80 (BP Location: Right Arm)   Pulse 81   Temp 98 F (36.7 C)   Resp 20   Ht '5\' 7"'$  (1.702 m)   Wt 49.9 kg   SpO2 100%   BMI 17.23 kg/m   Physical Exam Vitals and nursing note reviewed.  Constitutional:      General: She is not in acute distress.    Appearance: She is well-developed.  HENT:     Head: Normocephalic and atraumatic.     Right Ear: External ear normal.     Left Ear: External ear normal.     Nose: Nose normal.  Eyes:     Conjunctiva/sclera: Conjunctivae normal.  Cardiovascular:     Rate and Rhythm: Normal rate and regular rhythm.     Heart sounds: No murmur heard. Pulmonary:     Effort: No respiratory distress.     Breath sounds: No wheezing, rhonchi or rales.  Abdominal:     Palpations: Abdomen is soft.     Tenderness: There is abdominal tenderness (mild) in the right lower quadrant, suprapubic area and left lower quadrant. There is no guarding or rebound.  Musculoskeletal:     Cervical back: Normal range of motion and neck supple.     Right lower leg: No edema.     Left lower leg: No edema.  Skin:    General: Skin is warm and dry.     Findings: No rash.  Neurological:     General: No focal deficit present.     Mental Status: She is alert. Mental status is at baseline.     Motor: No weakness.  Psychiatric:        Mood and Affect: Mood normal.     ED Results / Procedures / Treatments   Labs (all labs ordered are listed, but only abnormal results are displayed) Labs Reviewed  COMPREHENSIVE METABOLIC PANEL - Abnormal; Notable for the following components:      Result Value   Glucose, Bld 107 (*)    All other components within normal limits  URINALYSIS, ROUTINE W REFLEX MICROSCOPIC - Abnormal; Notable for the following components:   Leukocytes,Ua LARGE (*)    Bacteria, UA RARE (*)    All  other components within normal limits  URINE CULTURE  LIPASE, BLOOD  CBC    EKG None  Radiology CT ABDOMEN PELVIS W CONTRAST  Result Date: 04/17/2022 CLINICAL DATA:  Acute abdominal pain for several days, history of prior small bowel resection EXAM: CT ABDOMEN AND PELVIS WITH CONTRAST TECHNIQUE: Multidetector CT imaging of the abdomen and pelvis was performed using the standard protocol following bolus administration of intravenous contrast. RADIATION DOSE REDUCTION: This exam was performed according to the departmental dose-optimization  program which includes automated exposure control, adjustment of the mA and/or kV according to patient size and/or use of iterative reconstruction technique. CONTRAST:  68m OMNIPAQUE IOHEXOL 300 MG/ML  SOLN COMPARISON:  02/12/2022 FINDINGS: Lower chest: Lung bases demonstrate some stable scarring in the left lower lobe. Hepatobiliary: Multiple peripherally enhancing lesions are identified within the liver consistent with hemangiomas. These are stable in appearance from the prior exam. Gallbladder is within normal limits. Pancreas: Unremarkable. No pancreatic ductal dilatation or surrounding inflammatory changes. Spleen: Normal in size without focal abnormality. Adrenals/Urinary Tract: Adrenal glands are within normal limits. Kidneys demonstrate a normal enhancement pattern bilaterally. Normal excretion is noted bilaterally. Simple renal cyst is noted on the left measuring up to 4 cm. No further follow-up is recommended. No renal calculi or obstructive changes are seen. The bladder is partially distended. Stomach/Bowel: Scattered mild fecal material is noted throughout the colon. No obstructive changes are seen. The appendix is air-filled and within normal limits. Changes of prior small bowel resection are again identified and stable. No small bowel obstructive changes are noted. Stomach is decompressed. Vascular/Lymphatic: No significant vascular findings are present.  No enlarged abdominal or pelvic lymph nodes. Reproductive: Large uterine fibroid is noted measuring up to 7.8 cm similar to that seen on the prior exam. No adnexal mass is noted. Other: No significant free pelvic fluid is noted. No free air is noted. Previously seen left inguinal hernia repair is again noted. No recurrent herniation is seen. Musculoskeletal: No acute or significant osseous findings. IMPRESSION: No acute abnormality correspond with the patient's given clinical history. Postsurgical changes are noted as described without recurrent hernia. Multiple hepatic hemangiomas. Large uterine fibroid without complicating factors. Electronically Signed   By: MInez CatalinaM.D.   On: 04/17/2022 19:09    Procedures Procedures    Medications Ordered in ED Medications  sodium chloride 0.9 % bolus 500 mL (has no administration in time range)    ED Course/ Medical Decision Making/ A&P    Patient seen and examined. History obtained directly from patient.   Labs/EKG: Ordered CBC, CMP, lipase, UA.  Imaging: Ordered CT of the abdomen pelvis.  Medications/Fluids: Ordered: IV fluid bolus.   Most recent vital signs reviewed and are as follows: BP 110/80 (BP Location: Right Arm)   Pulse 81   Temp 98 F (36.7 C)   Resp 20   Ht '5\' 7"'$  (1.702 m)   Wt 49.9 kg   SpO2 100%   BMI 17.23 kg/m   Initial impression: Abdominal pain, generalized weakness, recent surgery.  No chest pain, shortness of breath, tachycardia to suggest PE.  She denies any chest complaints.  9:53 PM Reassessment performed. Patient appears well, stable.  Labs personally reviewed and interpreted including: CBC unremarkable; CMP unremarkable; lipase normal; UA with large leukocytes and 11-20 white blood cells per high-power field.   Added on urine culture.  Imaging personally visualized and interpreted including: CT of the abdomen pelvis, agree no acute findings.  Patient does have a uterine fibroid and was made aware of  this.  Reviewed pertinent lab work and imaging with patient at bedside.  Patient's family members also at bedside.  We discussed urine findings and that patient will be notified if urine culture was positive.  She would likely benefit for treatment due to fatigue which could be caused by UTI potentially.  Questions answered.   Most current vital signs reviewed and are as follows: BP 108/78 (BP Location: Right Arm)   Pulse 94  Temp 98.9 F (37.2 C) (Oral)   Resp 14   Ht '5\' 7"'$  (1.702 m)   Wt 49.9 kg   SpO2 98%   BMI 17.23 kg/m      Plan: Discharge to home.  Orthostatics completed and reassuring.  Prescriptions written for: None  Other home care instructions discussed: Rest, good hydration.  Also discussed slow and careful position changes to prevent dizziness or lightheadedness.  ED return instructions discussed: The patient was urged to return to the Emergency Department immediately with worsening of current symptoms, episode of full syncope or severe lightheadedness, worsening abdominal pain, persistent vomiting, blood noted in stools, fever, or any other concerns. The patient verbalized understanding.   Follow-up instructions discussed: Patient encouraged to follow-up with their PCP in 3 days.                           Medical Decision Making Amount and/or Complexity of Data Reviewed Labs: ordered. Radiology: ordered.  Risk Prescription drug management.   For this patient's complaint of abdominal pain, the following conditions were considered on the differential diagnosis: gastritis/PUD, enteritis/duodenitis, appendicitis, cholelithiasis/cholecystitis, cholangitis, pancreatitis, ruptured viscus, colitis, diverticulitis, small/large bowel obstruction, proctitis, cystitis, pyelonephritis, ureteral colic, aortic dissection, aortic aneurysm. Atypical chest etiologies were also considered including ACS, PE, and pneumonia.   In regards to the patient's fatigue, normal red blood  cell count and no concern for anemia.  UA is inconclusive and culture is pending.  Patient is not significantly orthostatic here.  Very low concern for ACS, PE without accompanying shortness of breath or chest pain.  The patient's vital signs, pertinent lab work and imaging were reviewed and interpreted as discussed in the ED course. Hospitalization was considered for further testing, treatments, or serial exams/observation. However as patient is well-appearing, has a stable exam, and reassuring studies today, I do not feel that they warrant admission at this time. This plan was discussed with the patient who verbalizes agreement and comfort with this plan and seems reliable and able to return to the Emergency Department with worsening or changing symptoms.          Final Clinical Impression(s) / ED Diagnoses Final diagnoses:  Generalized weakness  Generalized abdominal pain  Lightheadedness    Rx / DC Orders ED Discharge Orders     None         Carlisle Cater, PA-C 04/17/22 2157

## 2022-04-17 NOTE — ED Triage Notes (Signed)
Pt coming from home with c/o LLQ pain, abdominal distention, generalized weakness since Friday. Pt also states that she feels dizzy when moving around. Denies n/v. Pt said that she was constipated Monday but took laxatives and stool softeners and no longer feels constipated. Pt recently had surgery on an incarcerated hernia.

## 2022-04-17 NOTE — Discharge Instructions (Addendum)
Please read and follow all provided instructions.  Your diagnoses today include:  1. Generalized weakness   2. Generalized abdominal pain   3. Lightheadedness     Tests performed today include: Blood cell counts and platelets Kidney and liver function tests Pancreas function test (called lipase) Urine test to look for infection: showed a few white blood cells so a urine culture was sent CT scan of the abdomen and pelvis: Does not show any concerning findings today related to your surgery.  You do have an uterine fibroid. Vital signs. See below for your results today.   Medications prescribed:  None  Take any prescribed medications only as directed.  Home care instructions:  Follow any educational materials contained in this packet.  Follow-up instructions: Please follow-up with your primary care provider in the next 3 days for further evaluation of your symptoms and follow-up of your urine culture results.   Return instructions:  SEEK IMMEDIATE MEDICAL ATTENTION IF: The pain does not go away or becomes severe  A temperature above 101F develops  Repeated vomiting occurs (multiple episodes)  The pain becomes localized to portions of the abdomen. The right side could possibly be appendicitis. In an adult, the left lower portion of the abdomen could be colitis or diverticulitis.  Blood is being passed in stools or vomit (bright red or black tarry stools)  You develop chest pain, difficulty breathing, dizziness or fainting, or become confused, poorly responsive, or inconsolable (young children) If you have any other emergent concerns regarding your health  Additional Information: Abdominal (belly) pain can be caused by many things. Your caregiver performed an examination and possibly ordered blood/urine tests and imaging (CT scan, x-rays, ultrasound). Many cases can be observed and treated at home after initial evaluation in the emergency department. Even though you are being  discharged home, abdominal pain can be unpredictable. Therefore, you need a repeated exam if your pain does not resolve, returns, or worsens. Most patients with abdominal pain don't have to be admitted to the hospital or have surgery, but serious problems like appendicitis and gallbladder attacks can start out as nonspecific pain. Many abdominal conditions cannot be diagnosed in one visit, so follow-up evaluations are very important.  Your vital signs today were: BP 108/78 (BP Location: Right Arm)   Pulse 94   Temp 98 F (36.7 C)   Resp 14   Ht '5\' 7"'$  (1.702 m)   Wt 49.9 kg   SpO2 98%   BMI 17.23 kg/m  If your blood pressure (bp) was elevated above 135/85 this visit, please have this repeated by your doctor within one month. --------------

## 2022-04-17 NOTE — Telephone Encounter (Signed)
Ok with me. Please place any necessary orders. 

## 2022-04-18 ENCOUNTER — Ambulatory Visit: Payer: Medicare HMO | Admitting: Family Medicine

## 2022-04-18 ENCOUNTER — Encounter (HOSPITAL_BASED_OUTPATIENT_CLINIC_OR_DEPARTMENT_OTHER): Payer: Medicare HMO | Admitting: General Surgery

## 2022-04-18 DIAGNOSIS — L89153 Pressure ulcer of sacral region, stage 3: Secondary | ICD-10-CM | POA: Diagnosis not present

## 2022-04-18 DIAGNOSIS — K219 Gastro-esophageal reflux disease without esophagitis: Secondary | ICD-10-CM | POA: Diagnosis not present

## 2022-04-18 DIAGNOSIS — G20 Parkinson's disease: Secondary | ICD-10-CM | POA: Diagnosis not present

## 2022-04-18 DIAGNOSIS — L98492 Non-pressure chronic ulcer of skin of other sites with fat layer exposed: Secondary | ICD-10-CM | POA: Diagnosis not present

## 2022-04-18 DIAGNOSIS — M1990 Unspecified osteoarthritis, unspecified site: Secondary | ICD-10-CM | POA: Diagnosis not present

## 2022-04-18 LAB — URINE CULTURE

## 2022-04-24 ENCOUNTER — Telehealth: Payer: Self-pay | Admitting: Family Medicine

## 2022-04-24 DIAGNOSIS — E43 Unspecified severe protein-calorie malnutrition: Secondary | ICD-10-CM | POA: Diagnosis not present

## 2022-04-24 DIAGNOSIS — D649 Anemia, unspecified: Secondary | ICD-10-CM | POA: Diagnosis not present

## 2022-04-24 DIAGNOSIS — G20 Parkinson's disease: Secondary | ICD-10-CM | POA: Diagnosis not present

## 2022-04-24 NOTE — Telephone Encounter (Signed)
FYI

## 2022-04-24 NOTE — Telephone Encounter (Signed)
Pt states her neurologist is going to take over the prescription listed below.   Pt states she wanted to let PCP know.    carbidopa-levodopa (SINEMET IR) 25-250 MG tablet [[id number]]  ENDED

## 2022-04-25 ENCOUNTER — Encounter (HOSPITAL_BASED_OUTPATIENT_CLINIC_OR_DEPARTMENT_OTHER): Payer: Medicare HMO | Admitting: General Surgery

## 2022-04-25 DIAGNOSIS — L89153 Pressure ulcer of sacral region, stage 3: Secondary | ICD-10-CM | POA: Diagnosis not present

## 2022-04-25 DIAGNOSIS — K219 Gastro-esophageal reflux disease without esophagitis: Secondary | ICD-10-CM | POA: Diagnosis not present

## 2022-04-25 DIAGNOSIS — M1990 Unspecified osteoarthritis, unspecified site: Secondary | ICD-10-CM | POA: Diagnosis not present

## 2022-04-25 DIAGNOSIS — G20 Parkinson's disease: Secondary | ICD-10-CM | POA: Diagnosis not present

## 2022-04-25 NOTE — Progress Notes (Addendum)
[name] [name], [name] [name] ([id number]) Visit Report for 04/25/2022 Chief Complaint Document Details Patient Name: Date of Service: [name] [name], [name] [name] 04/25/2022 3:15 PM Medical Record Number: [id number] Patient Account Number: [id number] Date of Birth/Sex: Treating RN: 1951/01/29 (71 y.o. [name] Primary Care Provider: Dimas Chyle Other Clinician: Referring Provider: Treating Provider/Extender: Randalyn Rhea in Treatment: 6 Information Obtained from: Patient Chief Complaint The patient presents to the wound care center today for evaluation of a stage III sacral pressure ulcer as well as a dehisced surgical wound from an emergent femoral hernia repair. Electronic Signature(s) Signed: 04/25/2022 4:05:06 PM By: Fredirick Maudlin MD FACS Entered By: Fredirick Maudlin on 04/25/2022 16:05:05 -------------------------------------------------------------------------------- HPI Details Patient Name: Date of Service: [name] [name] 04/25/2022 3:15 PM Medical Record Number: [id number] Patient Account Number: [id number] Date of Birth/Sex: Treating RN: 09-04-1951 (71 y.o. [name] Primary Care Provider: Dimas Chyle Other Clinician: Referring Provider: Treating Provider/Extender: Randalyn Rhea in Treatment: 6 History of Present Illness HPI Description: ADMISSION 03/12/2022 This is a 71 year old woman who underwent an emergency femoral hernia repair secondary to strangulated small bowel. She required a bowel resection and reanastomosis. While she was in the hospital, the wound dehisced and she was initially felt to have an enterocutaneous fistula. They were managing the effluent with an ostomy bag. She saw her general surgeon on May 11. At that time, there was no evidence of EC fistula and the wound was closing in nicely. This is currently being managed with silver alginate. Also during her hospitalization, she unfortunately developed a  pressure ulcer. This is currently stage III. She is in a skilled nursing facility at the moment but will be discharged on Sunday. This wound is being managed with Dakin's wet to dry dressing changes. She is not a diabetic, nor does she smoke. Her only real medical history is Parkinson's disease and fairly significant protein-calorie malnutrition. 04/04/2022: The inguinal wound is closed. The sacral wound is a little bit smaller today. It is clean without significant slough or odor. It continues to undermine for about 4 to 5 cm in the cranial direction. 04/11/2022: The orifice of her sacral wound continues to contract. She still has extensive undermining present. The wound is clean without slough or odor. She did not receive a wound VAC yet because her insurance has a contract with a different provider than the one we usually use. 04/18/2022: The wound is contracting but still has a deep area that is undermined. Apparently the cost for the wound VAC that she would be responsible for is prohibitive. She currently has Medicaid pending. The wound surface is pink and healthy-looking. There is just a little bit of slough and biofilm on the surface. 04/25/2022: The patient and her family decided they wanted to go ahead with wound VAC application. It has been delivered to their home, but they did not bring it with them because they understood that home health would initiate the therapy. Today, the wound surface looks clean and viable with good granulation tissue, but there is a musty odor and blue-green drainage on her dressing, consistent with Pseudomonas aeruginosa. Electronic Signature(s) Signed: 04/25/2022 4:06:00 PM By: Fredirick Maudlin MD FACS Entered By: Fredirick Maudlin on 04/25/2022 16:06:00 -------------------------------------------------------------------------------- Physical Exam Details Patient Name: Date of Service: [name] [name], [name] [name] 04/25/2022 3:15 PM Medical Record Number: [id number] Patient  Account Number: [id number] Date of Birth/Sex: Treating RN: 1951/01/15 (71 y.o. [name] Primary Care Provider: Dimas Chyle Other Clinician: Referring Provider: Treating Provider/Extender: Celine Ahr,  Danise Mina, Caleb Weeks in Treatment: 6 Constitutional . . . . No acute distress.Marland Kitchen Respiratory Normal work of breathing on room air.. Notes 04/25/2022: Today, the wound surface looks clean and viable with good granulation tissue, but there is a musty odor and blue-green drainage on her dressing, consistent with Pseudomonas aeruginosa. Electronic Signature(s) Signed: 04/25/2022 4:07:18 PM By: Fredirick Maudlin MD FACS Entered By: Fredirick Maudlin on 04/25/2022 16:07:17 -------------------------------------------------------------------------------- Physician Orders Details Patient Name: Date of Service: [name] [name], [name] [name] 04/25/2022 3:15 PM Medical Record Number: [id number] Patient Account Number: [id number] Date of Birth/Sex: Treating RN: 1951/03/26 (71 y.o. [name] Primary Care Provider: Dimas Chyle Other Clinician: Referring Provider: Treating Provider/Extender: Randalyn Rhea in Treatment: 6 Verbal / Phone Orders: No Diagnosis Coding ICD-10 Coding Code Description L89.153 Pressure ulcer of sacral region, stage 3 G20 Parkinson's disease Follow-up Appointments ppointment in 1 week. - Dr. Celine Ahr - Room 3- Thursday 7/6 at 2:45 pm Return A Other: - Try and increase protein intake-Premier Protein Shakes are 30g Protein,goal is 100g a day Bathing/ Shower/ Hygiene May shower with protection but do not get wound dressing(s) wet. Negative Presssure Wound Therapy Wound #2 Sacrum Wound Vac to wound continuously at 147m/hg pressure - [facility] to be changed by home health Mon, Wed, Fri Black and White Foam combination - White foam into undermining Off-Loading Low air-loss mattress (Group 2) Turn and  reposition every 2 hours Home Health No change in wound care orders this week[place]. May utilize formulary equivalent dressing for wound treatment orders unless otherwise specified. - home health to apply and change VAC 3 times per week Dressing changes to be completed [facility] / Wednesday / Friday [facility]: [facility] - Sacrum Cleanser: Soap and Water Every Other Day/15 Days Discharge Instructions: May shower and wash wound with dial antibacterial soap and water prior to dressing change. Cleanser: Wound Cleanser Every Other Day/15 Days Discharge Instructions: Cleanse the wound with wound cleanser prior to applying a clean dressing using gauze sponges, not tissue or cotton balls. Peri-Wound Care: Skin Prep Every Other Day/15 Days Discharge Instructions: Use skin prep as directed Prim Dressing: Dakin's Solution 0.25%, 16 (oz) Every Other Day/15 Days ary Discharge Instructions: Moisten gauze with Dakin's solution and pack lightly into wound in clinic Secondary Dressing: ALLEVYN Gentle Border, 4x4 (in/in) Every Other Day/15 Days Discharge Instructions: Apply over primary dressing as directed. Patient Medications llergies: Sulfa (Sulfonamide Antibiotics) A Notifications Medication Indication Start End 04/25/2022 levofloxacin DOSE oral 750 mg tablet - 1 tab p.o. daily x10 days Electronic Signature(s) Signed: 04/25/2022 4:20:54 PM By: CFredirick MaudlinMD FACS Previous Signature: 04/25/2022 4:08:45 PM Version By: CFredirick MaudlinMD FACS Entered By: CFredirick Maudlinon 04/25/2022 16:12:19 -------------------------------------------------------------------------------- Problem List Details Patient Name: Date of Service: GBeverlee Nims6/29/2023 3:15 PM Medical Record Number: [id number] Account Number: [id number] of  Birth/Sex: Treating RN: 404/19/1952(71y.o. FElam DutchPrimary Care Provider: PDimas ChyleOther Clinician: Referring Provider: Treating Provider/Extender: CRandalyn Rheain Treatment: 6 Active Problems ICD-10 Encounter Code Description Active Date MDM Diagnosis L89.153 Pressure ulcer of sacral region, stage 3 03/12/2022 No Yes G20 Parkinson's disease 03/12/2022 No Yes Inactive Problems ICD-10 Code Description Active Date Inactive Date T81.31XA Disruption of external operation (surgical) wound, not elsewhere classified, initial 03/12/2022 03/12/2022  encounter Resolved Problems Electronic Signature(s) Signed: 04/25/2022 4:04:54 PM By: Fredirick Maudlin MD FACS Entered By: Fredirick Maudlin on 04/25/2022 16:04:54 -------------------------------------------------------------------------------- Progress Note Details Patient Name: Date of Service: [name] [name] 04/25/2022 3:15 PM Medical Record Number: [id number] Patient Account Number: [id number] Date of Birth/Sex: Treating RN: 1951/06/07 (71 y.o. [name] Primary Care Provider: Dimas Chyle Other Clinician: Referring Provider: Treating Provider/Extender: Randalyn Rhea in Treatment: 6 Subjective Chief Complaint Information obtained from Patient The patient presents to the wound care center today for evaluation of a stage III sacral pressure ulcer as well as a dehisced surgical wound from an emergent femoral hernia repair. History of Present Illness (HPI) ADMISSION 03/12/2022 This is a 71 year old woman who underwent an emergency femoral hernia repair secondary to strangulated small bowel. She required a bowel resection and reanastomosis. While she was in the hospital, the wound dehisced and she was initially felt to have an enterocutaneous fistula. They were managing the effluent with an ostomy bag. She saw her general surgeon on May 11. At that time, there was no  evidence of EC fistula and the wound was closing in nicely. This is currently being managed with silver alginate. Also during her hospitalization, she unfortunately developed a pressure ulcer. This is currently stage III. She is in a skilled nursing facility at the moment but will be discharged on Sunday. This wound is being managed with Dakin's wet to dry dressing changes. She is not a diabetic, nor does she smoke. Her only real medical history is Parkinson's disease and fairly significant protein-calorie malnutrition. 04/04/2022: The inguinal wound is closed. The sacral wound is a little bit smaller today. It is clean without significant slough or odor. It continues to undermine for about 4 to 5 cm in the cranial direction. 04/11/2022: The orifice of her sacral wound continues to contract. She still has extensive undermining present. The wound is clean without slough or odor. She did not receive a wound VAC yet because her insurance has a contract with a different provider than the one we usually use. 04/18/2022: The wound is contracting but still has a deep area that is undermined. Apparently the cost for the wound VAC that she would be responsible for is prohibitive. She currently has Medicaid pending. The wound surface is pink and healthy-looking. There is just a little bit of slough and biofilm on the surface. 04/25/2022: The patient and her family decided they wanted to go ahead with wound VAC application. It has been delivered to their home, but they did not bring it with them because they understood that home health would initiate the therapy. Today, the wound surface looks clean and viable with good granulation tissue, but there is a musty odor and blue-green drainage on her dressing, consistent with Pseudomonas aeruginosa. Patient History Information obtained from Patient, Caregiver. Family History Cancer - Mother,Father,Siblings, Hypertension - Siblings,Child. Social History Never smoker,  Marital Status - Divorced, Alcohol Use - Never, Drug Use - No History, Caffeine Use - Moderate - Coffee, soda. Medical History Eyes Patient has history of Cataracts Musculoskeletal Patient has history of Osteoarthritis Medical A Surgical History Notes nd Gastrointestinal GERD Musculoskeletal degenerative disc disease Objective Constitutional No acute distress.. Vitals Time Taken: 3:45 PM, Height: 67 in, Weight: 106 lbs, BMI: 16.6, Temperature: 97.8 F, Pulse: 89 bpm, Respiratory Rate: 18 breaths/min, Blood Pressure: 122/80 mmHg. Respiratory Normal work of breathing on room air.. General Notes: 04/25/2022: T oday, the wound surface looks clean and viable with good granulation tissue,  but there is a musty odor and blue-green drainage on her dressing, consistent with Pseudomonas aeruginosa. Integumentary (Hair, Skin) Wound #2 status is Open. Original cause of wound was Pressure Injury. The date acquired was: 02/17/2022. The wound has been in treatment 6 weeks. The wound is located on the Sacrum. The wound measures 0.9cm length x 1.1cm width x 1.4cm depth; 0.778cm^2 area and 1.089cm^3 volume. There is Fat Layer (Subcutaneous Tissue) exposed. There is no tunneling noted, however, there is undermining starting at 10:00 and ending at 6:00 with a maximum distance of 2.4cm. There is a medium amount of purulent drainage noted. The wound margin is well defined and not attached to the wound base. There is large (67-100%) red, hyper - granulation within the wound bed. There is no necrotic tissue within the wound bed. General Notes: blue/green drainage Assessment Active Problems ICD-10 Pressure ulcer of sacral region, stage 3 Parkinson's disease Plan Follow-up Appointments: Return Appointment in 1 week. - Dr. Celine Ahr - Room 3- Thursday 7/6 at 2:45 pm Other: - Try and increase protein intake-Premier Protein Shakes are 30g Protein,goal is 100g a day Bathing/ Shower/ Hygiene: May shower with  protection but do not get wound dressing(s) wet. Negative Presssure Wound Therapy: Wound #2 Sacrum: Wound Vac to wound continuously at 158m/hg pressure - [facility] to be changed by home health Mon, Wed, Fri Black and White Foam combination - White foam into undermining Off-Loading: Low air-loss mattress (Group 2) Turn and reposition every 2 hours Home Health: No change in wound care orders this week; continue Home Health for wound care. May utilize formulary equivalent dressing for wound treatment orders unless otherwise specified. - home health to apply and change VAC 3 times per week Dressing changes to [place] / Wednesday / Friday except [facility] Orders/Instructions: -[facility] DenmarkThe following medication(s) was prescribed: levofloxacin oral 750 mg tablet 1 tab p.o. daily x10 days starting 04/25/2022 WOUND #2: - Sacrum Wound Laterality: Cleanser: Soap and Water Every Other Day/15 Days Discharge Instructions: May shower and wash wound with dial antibacterial soap and water prior to dressing change. Cleanser: Wound Cleanser Every Other Day/15 Days Discharge Instructions: Cleanse the wound with wound cleanser prior to applying a clean dressing using gauze sponges, not tissue or cotton balls. Peri-Wound Care: Skin Prep Every Other Day/15 Days Discharge Instructions: Use skin prep as directed Prim Dressing: Dakin's Solution 0.25%, 16 (oz) Every Other Day/15 Days ary Discharge Instructions: Moisten gauze with Dakin's solution and pack lightly into wound in clinic Secondary Dressing: ALLEVYN Gentle Border, 4x4 (in/in) Every Other Day/15 Days Discharge Instructions: Apply over primary dressing as directed. 04/25/2022: Today, the wound surface looks clean and viable with good granulation tissue, but there is a musty odor and blue-green drainage on her  dressing, consistent with Pseudomonas aeruginosa. No sharp debridement was necessary today. I mechanically debrided biofilm from the wound surface with gauze. I am going to prescribe levofloxacin to cover Pseudomonas. Her wound VAC will be initiated tomorrow by her home health providers. She will follow-up here in 1 week. Electronic Signature(s) Signed: 04/25/2022 4:13:07 PM By: CFredirick MaudlinMD FACS Entered By: CFredirick Maudlinon 04/25/2022 16:13:06 -------------------------------------------------------------------------------- HxROS Details Patient Name: Date of Service: GBeverlee Nims6/29/2023 3:15 PM Medical Record Number: [id number] Account Number: [id number] of Birth/Sex: Treating RN: [birth year](71y.o. FAmerica BrownPrimary Care Provider: PDimas Chyle  Other Clinician: Referring Provider: Treating Provider/Extender: Randalyn Rhea in Treatment: 6 Information Obtained From Patient Caregiver Eyes Medical History: Positive for: Cataracts Gastrointestinal Medical History: Past Medical History Notes: GERD Musculoskeletal Medical History: Positive for: Osteoarthritis Past Medical History Notes: degenerative disc disease HBO Extended History Items Eyes: Cataracts Immunizations Pneumococcal Vaccine: Received Pneumococcal Vaccination: Yes Received Pneumococcal Vaccination On or After 60th Birthday: No Implantable Devices None Family and Social History Cancer: Yes - Mother,Father,Siblings; Hypertension: Yes - Siblings,Child; Never smoker; Marital Status - Divorced; Alcohol Use: Never; Drug Use: No History; Caffeine Use: Moderate - Coffee, soda; Financial Concerns: No; Food, Clothing or Shelter Needs: No; Support System Lacking: No; Transportation Concerns: No Engineer, maintenance) Signed: 04/25/2022 4:20:54 PM By: Fredirick Maudlin MD FACS Signed: 04/25/2022 6:13:27 PM By: Dellie Catholic RN Entered By: Fredirick Maudlin on  04/25/2022 16:06:31 -------------------------------------------------------------------------------- SuperBill Details Patient Name: Date of Service: [name] [name] 04/25/2022 Medical Record Number: [id number] Patient Account Number: [id number] Date of Birth/Sex: Treating RN: Dec 31, 1950 (71 y.o. [name] Primary Care Provider: Dimas Chyle Other Clinician: Referring Provider: Treating Provider/Extender: Randalyn Rhea in Treatment: 6 Diagnosis Coding ICD-10 Codes Code Description [phone] Pressure ulcer of sacral region, stage 3 G20 Parkinson's disease Facility Procedures CPT4 Code: [id number] Description: 99213 - WOUND CARE VISIT-LEV 3 EST PT Modifier: Quantity: 1 Physician Procedures : CPT4 Code Description Modifier [id number] - WC PHYS LEVEL 4 - EST PT ICD-10 Diagnosis Description L89.153 Pressure ulcer of sacral region, stage 3 G20 Parkinson's disease Quantity: 1 Electronic Signature(s) Signed: 04/26/2022 12:20:34 PM By: Fredirick Maudlin MD FACS Signed: 04/26/2022 1:09:42 PM By: Dellie Catholic RN Previous Signature: 04/25/2022 4:13:21 PM Version By: Fredirick Maudlin MD FACS Entered By: Dellie Catholic on 04/26/2022 12:13:16

## 2022-04-25 NOTE — Progress Notes (Addendum)
[name], [name] ([id number]) Visit Report for 04/25/2022 Arrival Information Details Patient Name: Date of Service: [name] [name], [name] [name] 04/25/2022 3:15 PM Medical Record Number: [id number] Patient Account Number: [id number] Date of Birth/Sex: Treating RN: 10-22-1951 (71 y.o. [name] [name] Primary Care Imaan Padgett: Dimas Chyle Other Clinician: Referring Ketara Cavness: Treating Luwanna Brossman/Extender: Randalyn Rhea in Treatment: 6 Visit Information History Since Last Visit Added or deleted any medications: No Patient Arrived: Wheel Chair Any new allergies or adverse reactions: No Arrival Time: 15:46 Had a fall or experienced change in No Accompanied By: daughter activities of daily living that may affect Transfer Assistance: Manual risk of falls: Patient Identification Verified: Yes Signs or symptoms of abuse/neglect since last visito No Secondary Verification Process Completed: Yes Hospitalized since last visit: No Patient Requires Transmission-Based Precautions: No Implantable device outside of the clinic excluding No Patient Has Alerts: No cellular tissue based products placed in the center since last visit: Has Dressing in Place as Prescribed: Yes Pain Present Now: No Electronic Signature(s) Signed: 04/25/2022 5:59:23 PM By: Baruch Gouty RN, BSN Entered By: Baruch Gouty on 04/25/2022 15:47:45 -------------------------------------------------------------------------------- Clinic Level of Care Assessment Details Patient Name: Date of Service: [name] [name], [name] [name] 04/25/2022 3:15 PM Medical Record Number: [id number] Patient Account Number: [id number] Date of Birth/Sex: Treating RN: Mar 06, 1951 (71 y.o. [name] [name] Primary Care Oron Westrup: Dimas Chyle Other Clinician: Referring Kamie Korber: Treating Savien Mamula/Extender: Randalyn Rhea in Treatment: 6 Clinic Level of Care Assessment Items TOOL 4 Quantity Score X- 1 0 Use when only an  EandM is performed on FOLLOW-UP visit ASSESSMENTS - Nursing Assessment / Reassessment []  - 0 Reassessment of Co-morbidities (includes updates in patient status) []  - 0 Reassessment of Adherence to Treatment Plan ASSESSMENTS - Wound and Skin A ssessment / Reassessment X - Simple Wound Assessment / Reassessment - one wound 1 5 []  - 0 Complex Wound Assessment / Reassessment - multiple wounds []  - 0 Dermatologic / Skin Assessment (not related to wound area) ASSESSMENTS - Focused Assessment []  - 0 Circumferential Edema Measurements - multi extremities []  - 0 Nutritional Assessment / Counseling / Intervention []  - 0 Lower Extremity Assessment (monofilament, tuning fork, pulses) []  - 0 Peripheral Arterial Disease Assessment (using hand held doppler) ASSESSMENTS - Ostomy and/or Continence Assessment and Care []  - 0 Incontinence Assessment and Management []  - 0 Ostomy Care Assessment and Management (repouching, etc.) PROCESS - Coordination of Care X - Simple Patient / Family Education for ongoing care 1 15 []  - 0 Complex (extensive) Patient / Family Education for ongoing care X- 1 10 Staff obtains Programmer, systems, Records, T Results / Process Orders est X- 1 10 Staff telephones HHA, Nursing Homes / Clarify orders / etc []  - 0 Routine Transfer to another Facility (non-emergent condition) []  - 0 Routine Hospital Admission (non-emergent condition) X- 1 15 New Admissions / Biomedical engineer / Ordering NPWT Apligraf, etc. , []  - 0 Emergency Hospital Admission (emergent condition) X- 1 10 Simple Discharge Coordination []  - 0 Complex (extensive) Discharge Coordination PROCESS - Special Needs []  - 0 Pediatric / Minor Patient Management []  - 0 Isolation Patient Management []  - 0 Hearing / Language / Visual special needs []  - 0 Assessment of Community assistance (transportation, D/C planning, etc.) []  - 0 Additional assistance / Altered mentation []  - 0 Support Surface(s)  Assessment (bed, cushion, seat, etc.) INTERVENTIONS - Wound Cleansing / Measurement X - Simple Wound Cleansing - one wound 1 5 []  - 0 Complex Wound Cleansing - multiple wounds X-  1 5 Wound Imaging (photographs - any number of wounds) $RemoveBe'[]'OqsjoGEhc$  - 0 Wound Tracing (instead of photographs) X- 1 5 Simple Wound Measurement - one wound $RemoveB'[]'zjRQFJnV$  - 0 Complex Wound Measurement - multiple wounds INTERVENTIONS - Wound Dressings X - Small Wound Dressing one or multiple wounds 1 10 $Re'[]'FyR$  - 0 Medium Wound Dressing one or multiple wounds $RemoveBeforeD'[]'JvuRPnilOYFgqJ$  - 0 Large Wound Dressing one or multiple wounds $RemoveBeforeD'[]'yrooNFXVlwLzOf$  - 0 Application of Medications - topical $RemoveB'[]'tHVYOIol$  - 0 Application of Medications - injection INTERVENTIONS - Miscellaneous $RemoveBeforeD'[]'NGjzbFmhAYfPwL$  - 0 External ear exam $Remove'[]'fuNHcld$  - 0 Specimen Collection (cultures, biopsies, blood, body fluids, etc.) $RemoveBefor'[]'DJApZvMRslxo$  - 0 Specimen(s) / Culture(s) sent or taken to Lab for analysis $RemoveBefo'[]'KfyLTWIGGiT$  - 0 Patient Transfer (multiple staff / Civil Service fast streamer / Similar devices) $RemoveBeforeDE'[]'GeQLozNqWFCbGQz$  - 0 Simple Staple / Suture removal (25 or less) $Remove'[]'SGFYFWu$  - 0 Complex Staple / Suture removal (26 or more) $Remove'[]'WXynXfg$  - 0 Hypo / Hyperglycemic Management (close monitor of Blood Glucose) $RemoveBefore'[]'KwGhhaGUMsMkU$  - 0 Ankle / Brachial Index (ABI) - do not check if billed separately X- 1 5 Vital Signs Has the patient been seen at the hospital within the last three years: Yes Total Score: 95 Level Of Care: New/Established - Level 3 Electronic Signature(s) Signed: 04/26/2022 1:09:42 PM By: Dellie Catholic RN Entered By: Dellie Catholic on 04/26/2022 12:13:08 -------------------------------------------------------------------------------- Encounter Discharge Information Details Patient Name: Date of Service: [name] [name] 04/25/2022 3:15 PM Medical Record Number: [id number] Patient Account Number: [id number] Date of Birth/Sex: Treating RN: [birth year] (71 y.o. [name] [name] Primary Care Madisen Ludvigsen: Dimas Chyle Other Clinician: Referring Juvon Teater: Treating Tomie Spizzirri/Extender: Randalyn Rhea in Treatment: 6 Encounter Discharge Information Items Discharge Condition: Stable Ambulatory Status: Walker Discharge Destination: Home Transportation: Private Auto Accompanied By: daughter Schedule Follow-up Appointment: Yes Clinical Summary of Care: Patient Declined Electronic Signature(s) Signed: 04/25/2022 5:59:23 PM By: Baruch Gouty RN, BSN Entered By: Baruch Gouty on 04/25/2022 16:14:04 -------------------------------------------------------------------------------- Lower Extremity Assessment Details Patient Name: Date of Service: [name] [name], [name] [name] 04/25/2022 3:15 PM Medical Record Number: [id number] Patient Account Number: [id number] Date of Birth/Sex: Treating RN: Dec 08, 1950 (71 y.o. [name] [name] Primary Care Whitni Pasquini: Dimas Chyle Other Clinician: Referring Bellina Tokarczyk: Treating Ceasar Decandia/Extender: Randalyn Rhea in Treatment: 6 Electronic Signature(s) Signed: 04/25/2022 5:59:23 PM By: Baruch Gouty RN, BSN Entered By: Baruch Gouty on 04/25/2022 15:48:42 -------------------------------------------------------------------------------- Multi Wound Chart Details Patient Name: Date of Service: [name] [name] 04/25/2022 3:15 PM Medical Record Number: [id number] Patient Account Number: [id number] Date of Birth/Sex: Treating RN: July 21, 1951 (71 y.o. [name] [name] Primary Care Jase Himmelberger: Dimas Chyle Other Clinician: Referring Mills Mitton: Treating Georgie Haque/Extender: Randalyn Rhea in Treatment: 6 Vital Signs Height(in): 67 Pulse(bpm): 89 Weight(lbs): 106 Blood Pressure(mmHg): 122/80 Body Mass Index(BMI): 16.6 Temperature(F): 97.8 Respiratory Rate(breaths/min): 18 Photos: [N/A:N/A] Sacrum N/A N/A Wound Location: Pressure Injury N/A N/A Wounding Event: Pressure Ulcer N/A N/A Primary Etiology: Cataracts, Osteoarthritis N/A N/A Comorbid History: 02/17/2022 N/A N/A Date  Acquired: 6 N/A N/A Weeks of Treatment: Open N/A N/A Wound Status: No N/A N/A Wound Recurrence: 0.9x1.1x1.4 N/A N/A Measurements L x W x D (cm) 0.778 N/A N/A A (cm) : rea 1.089 N/A N/A Volume (cm) : 88.20% N/A N/A % Reduction in A rea: 94.80% N/A N/A % Reduction in Volume: 10 Starting Position 1 (o'clock): 6 Ending Position 1 (o'clock): 2.4 Maximum Distance 1 (cm): Yes N/A N/A Undermining: Category/Stage III N/A N/A Classification: Medium N/A N/A Exudate A mount: Purulent N/A N/A Exudate Type: yellow, [name], green N/A  N/A Exudate Color: Well defined, not attached N/A N/A Wound Margin: Large (67-100%) N/A N/A Granulation A mount: Red, Hyper-granulation N/A N/A Granulation Quality: None Present (0%) N/A N/A Necrotic A mount: Fat Layer (Subcutaneous Tissue): Yes N/A N/A Exposed Structures: Fascia: No Tendon: No Muscle: No Joint: No Bone: No None N/A N/A Epithelialization: blue/green drainage N/A N/A Assessment Notes: Treatment Notes Electronic Signature(s) Signed: 04/25/2022 4:04:59 PM By: Fredirick Maudlin MD FACS Signed: 04/25/2022 6:13:27 PM By: Dellie Catholic RN Entered By: Fredirick Maudlin on 04/25/2022 16:04:59 -------------------------------------------------------------------------------- Multi-Disciplinary Care Plan Details Patient Name: Date of Service: RUBENA, ROSEMAN 04/25/2022 3:15 PM Medical Record Number: [id number] Patient Account Number: [id number] Date of Birth/Sex: Treating RN: [birth year] (71 y.o. [name] [name] Primary Care Lyana Asbill: Other Clinician: Dimas Chyle Referring Jatavion Peaster: Treating Sonita Michiels/Extender: Randalyn Rhea in Treatment: 6 Multidisciplinary Care Plan reviewed with physician Active Inactive Pressure Nursing Diagnoses: Knowledge deficit related to causes and risk factors for pressure ulcer development Knowledge deficit related to management of pressures ulcers Potential for  impaired tissue integrity related to pressure, friction, moisture, and shear Goals: Patient/caregiver will verbalize risk factors for pressure ulcer development Date Initiated: 03/12/2022 Date Inactivated: 04/11/2022 Target Resolution Date: 04/12/2022 Goal Status: Met Patient/caregiver will verbalize understanding of pressure ulcer management Date Initiated: 03/12/2022 Target Resolution Date: 05/22/2022 Goal Status: Active Interventions: Assess: immobility, friction, shearing, incontinence upon admission and as needed Assess offloading mechanisms upon admission and as needed Assess potential for pressure ulcer upon admission and as needed Provide education on pressure ulcers Notes: Wound/Skin Impairment Nursing Diagnoses: Impaired tissue integrity Knowledge deficit related to ulceration/compromised skin integrity Goals: Patient/caregiver will verbalize understanding of skin care regimen Date Initiated: 03/12/2022 Target Resolution Date: 05/10/2022 Goal Status: Active Interventions: Assess patient/caregiver ability to obtain necessary supplies Assess patient/caregiver ability to perform ulcer/skin care regimen upon admission and as needed Assess ulceration(s) every visit Provide education on ulcer and skin care Notes: Electronic Signature(s) Signed: 04/25/2022 5:59:23 PM By: Baruch Gouty RN, BSN Entered By: Baruch Gouty on 04/25/2022 15:50:30 -------------------------------------------------------------------------------- Pain Assessment Details Patient Name: Date of Service: [name] [name] 04/25/2022 3:15 PM Medical Record Number: [id number] Patient Account Number: [id number] Date of Birth/Sex: Treating RN: 1951/09/14 (71 y.o. [name] [name] Primary Care Delmer Kowalski: Dimas Chyle Other Clinician: Referring Ismaeel Arvelo: Treating Jeanae Whitmill/Extender: Randalyn Rhea in Treatment: 6 Active Problems Location of Pain Severity and Description of  Pain Patient Has Paino No Site Locations Rate the pain. Current Pain Level: 0 Pain Management and Medication Current Pain Management: Electronic Signature(s) Signed: 04/25/2022 5:59:23 PM By: Baruch Gouty RN, BSN Entered By: Baruch Gouty on 04/25/2022 15:48:35 -------------------------------------------------------------------------------- Patient/Caregiver Education Details Patient Name: Date of Service: [name] [name] 6/29/2023andnbsp3:15 PM Medical Record Number: [id number] Patient Account Number: [id number] Date of Birth/Gender: Treating RN: 10-25-1951 (71 y.o. [name] [name] Primary Care Physician: Dimas Chyle Other Clinician: Referring Physician: Treating Physician/Extender: Randalyn Rhea in Treatment: 6 Education Assessment Education Provided To: Patient Education Topics Provided Pressure: Methods: Explain/Verbal Responses: Reinforcements needed, State content correctly Wound/Skin Impairment: Methods: Explain/Verbal Responses: Reinforcements needed, State content correctly Electronic Signature(s) Signed: 04/25/2022 5:59:23 PM By: Baruch Gouty RN, BSN Entered By: Baruch Gouty on 04/25/2022 15:50:49 -------------------------------------------------------------------------------- Wound Assessment Details Patient Name: Date of Service: [name] [name] 04/25/2022 3:15 PM Medical Record Number: [id number] Patient Account Number: [id number] Date of Birth/Sex: Treating RN: 1951-03-04 (71 y.o. [name] [name] Primary Care Keyana Guevara: Dimas Chyle Other Clinician: Referring Romey Cohea: Treating Itzell Bendavid/Extender: Randalyn Rhea in Treatment: 6  Wound Status Wound Number: 2 Primary Etiology: Pressure Ulcer Wound Location: Sacrum Wound Status: Open Wounding Event: Pressure Injury Comorbid History: Cataracts, Osteoarthritis Date Acquired: 02/17/2022 Weeks Of Treatment: 6 Clustered Wound:  No Photos Wound Measurements Length: (cm) 0.9 Width: (cm) 1.1 Depth: (cm) 1.4 Area: (cm) 0.778 Volume: (cm) 1.089 % Reduction in Area: 88.2% % Reduction in Volume: 94.8% Epithelialization: None Tunneling: No Undermining: Yes Starting Position (o'clock): 10 Ending Position (o'clock): 6 Maximum Distance: (cm) 2.4 Wound Description Classification: Category/Stage III Wound Margin: Well defined, not attached Exudate Amount: Medium Exudate Type: Purulent Exudate Color: yellow, [name], green Foul Odor After Cleansing: No Slough/Fibrino No Wound Bed Granulation Amount: Large (67-100%) Exposed Structure Granulation Quality: Red, Hyper-granulation Fascia Exposed: No Necrotic Amount: None Present (0%) Fat Layer (Subcutaneous Tissue) Exposed: Yes Tendon Exposed: No Muscle Exposed: No Joint Exposed: No Bone Exposed: No Assessment Notes blue/green drainage Treatment Notes Wound #2 (Sacrum) Cleanser Soap and Water Discharge Instruction: May shower and wash wound with dial antibacterial soap and water prior to dressing change. Wound Cleanser Discharge Instruction: Cleanse the wound with wound cleanser prior to applying a clean dressing using gauze sponges, not tissue or cotton balls. Peri-Wound Care Skin Prep Discharge Instruction: Use skin prep as directed Topical Primary Dressing Dakin's Solution 0.25%, 16 (oz) Discharge Instruction: Moisten gauze with Dakin's solution and pack lightly into wound in clinic Secondary Dressing ALLEVYN Gentle Border, 4x4 (in/in) Discharge Instruction: Apply over primary dressing as directed. Secured With Compression Wrap Compression Stockings Environmental education officer) Signed: 04/25/2022 5:59:23 PM By: Baruch Gouty RN, BSN Entered By: Baruch Gouty on 04/25/2022 15:52:01 -------------------------------------------------------------------------------- Vitals Details Patient Name: Date of Service: [name] [name] 04/25/2022  3:15 PM Medical Record Number: [id number] Patient Account Number: [id number] Date of Birth/Sex: Treating RN: 12-05-1950 (71 y.o. [name] [name] Primary Care Jamielyn Petrucci: Dimas Chyle Other Clinician: Referring Mykaela Arena: Treating Donnica Jarnagin/Extender: Randalyn Rhea in Treatment: 6 Vital Signs Time Taken: 15:45 Temperature (F): 97.8 Height (in): 67 Pulse (bpm): 89 Weight (lbs): 106 Respiratory Rate (breaths/min): 18 Body Mass Index (BMI): 16.6 Blood Pressure (mmHg): 122/80 Reference Range: 80 - 120 mg / dl Electronic Signature(s) Signed: 04/25/2022 5:59:23 PM By: Baruch Gouty RN, BSN Entered By: Baruch Gouty on 04/25/2022 15:48:26

## 2022-05-02 ENCOUNTER — Encounter (HOSPITAL_BASED_OUTPATIENT_CLINIC_OR_DEPARTMENT_OTHER): Payer: Medicare HMO | Attending: General Surgery | Admitting: General Surgery

## 2022-05-02 ENCOUNTER — Telehealth: Payer: Self-pay | Admitting: Pharmacist

## 2022-05-02 DIAGNOSIS — G20 Parkinson's disease: Secondary | ICD-10-CM | POA: Diagnosis not present

## 2022-05-02 DIAGNOSIS — E46 Unspecified protein-calorie malnutrition: Secondary | ICD-10-CM | POA: Diagnosis not present

## 2022-05-02 DIAGNOSIS — L89153 Pressure ulcer of sacral region, stage 3: Secondary | ICD-10-CM | POA: Insufficient documentation

## 2022-05-02 NOTE — Progress Notes (Signed)
Chronic Care Management Pharmacy Assistant   Name: [name] [name]  MRN: [id number] DOB: Jul 18, 1951   Reason for Encounter: Medication Coordination Call    Recent office visits:  None  Recent consult visits:  None  Hospital visits:  04/17/2022 ED visit for Generalized weakness -No medication changes indicated.  Medications: Outpatient Encounter Medications as of 05/02/2022  Medication Sig   acetaminophen (TYLENOL) 160 MG/5ML solution Take 20.3 mLs (650 mg total) by mouth every 6 (six) hours as needed for mild pain.   Amino Acids-Protein Hydrolys (FEEDING SUPPLEMENT, PRO-STAT 64,) LIQD Take 30 mLs by mouth 3 (three) times daily with meals.   Ascorbic Acid (VITAMIN C PO) Take 1 tablet by mouth daily.   b complex vitamins capsule Take 1 capsule by mouth daily.   carbidopa-levodopa (SINEMET IR) 25-250 MG tablet Take 1 tablet by mouth 4 (four) times daily.   Cholecalciferol (VITAMIN D) 2000 UNITS CAPS Take 2,000 Units by mouth daily.   feeding supplement (ENSURE ENLIVE / ENSURE PLUS) LIQD Take 237 mLs by mouth 3 (three) times daily between meals.   glucosamine-chondroitin 500-400 MG tablet Take 1 tablet by mouth daily.   guaiFENesin-dextromethorphan (ROBITUSSIN DM) 100-10 MG/5ML syrup Take 15 mLs by mouth 2 (two) times daily.   hydrocortisone cream 1 % Apply topically daily.   hydrOXYzine (ATARAX) 10 MG tablet Take 1 tablet (10 mg total) by mouth 3 (three) times daily as needed for anxiety.   leptospermum manuka honey (MEDIHONEY) PSTE paste Apply 1 application. topically daily.   lidocaine (LIDODERM) 5 % Place 1 patch onto the skin daily. Remove & Discard patch within 12 hours or as directed by MD   Multiple Vitamin (MULTIVITAMIN) capsule Take 1 capsule by mouth daily.   Omega-3 Fatty Acids (OMEGA 3 PO) Take 1 tablet by mouth daily.   pantoprazole (PROTONIX) 40 MG tablet Take 1 tablet (40 mg total) by mouth daily.   QUEtiapine (SEROQUEL) 25 MG tablet Take 1 tablet (25 mg total) by  mouth at bedtime. (Patient not taking: Reported on 03/06/2022)   tetrahydrozoline-zinc (VISINE-AC) 0.05-0.25 % ophthalmic solution Place 2 drops into both eyes 4 (four) times daily as needed (allergies).   vitamin B-12 (CYANOCOBALAMIN) 1000 MCG tablet Take 1,000 mcg by mouth daily.   [DISCONTINUED] amantadine (SYMMETREL) 100 MG capsule Take 1 capsule (100 mg total) by mouth 2 (two) times daily. (Patient not taking: Reported on 09/21/2019)   No facility-administered encounter medications on file as of 05/02/2022.   Reviewed chart for medication changes ahead of medication coordination call.  No medication changes indicated.  BP Readings from Last 3 Encounters:  04/17/22 108/78  03/19/22 108/74  03/01/22 104/72    Lab Results  Component Value Date   HGBA1C 5.4 01/19/2022     Patient obtains medications through Vials  90 Days   Last adherence delivery included:  Buspirone 5 mg twice daily Carbidopa-Levodopa 25-100 mg three times daily Diazepam 2 mg  1/2 tablet q12hrs prn Pantoprazole 40 mg once daily  Patient is due for next adherence delivery on: 05/10/2022. Called patient and reviewed medications and coordinated delivery.  This delivery to include: Pantoprazole 40 mg once daily Carbidopa-Levodopa 25-100 mg three times daily  Confirmed delivery date of 05/10/2022, advised patient that pharmacy will contact them the morning of delivery.  Care Gaps: Medicare Annual Wellness: Completed 12/14/2021 Hemoglobin A1C: 5.4% on 01/19/2022 Colonoscopy: Overdue since 01/10/2016 Dexa Scan: Completed Mammogram: Next due on 07/12/2023  Future Appointments  Date Time Provider [place]  05/02/2022  2:45 PM Fredirick Maudlin, MD [facility]  05/17/2022  2:20 PM Vivi Barrack, MD [facility]  09/16/2022  2:00 PM [facility] PHARMACIST [facility]  12/27/2022  3:15 PM [place]   April D Calhoun, [place] Pharmacist Assistant [phone]

## 2022-05-09 ENCOUNTER — Telehealth: Payer: Self-pay | Admitting: Family Medicine

## 2022-05-09 ENCOUNTER — Encounter (HOSPITAL_BASED_OUTPATIENT_CLINIC_OR_DEPARTMENT_OTHER): Payer: Medicare HMO | Admitting: General Surgery

## 2022-05-09 NOTE — Telephone Encounter (Signed)
Ok to schedule ED f/u VV?

## 2022-05-09 NOTE — Telephone Encounter (Signed)
Patient's daughter Waldemar Dickens requests to be called at ph# [phone] regarding if Patient can be scheduled for a Virtual Visit for follow up instead of in person. Please advise.

## 2022-05-10 NOTE — Telephone Encounter (Signed)
Virtual is fine.  Algis Greenhouse. Jerline Pain, MD 05/10/2022 10:17 AM

## 2022-05-10 NOTE — Telephone Encounter (Signed)
Please schedule VV

## 2022-05-10 NOTE — Telephone Encounter (Signed)
FYI--Called patient's daughter to schedule visit but due to no answer LVM.

## 2022-05-14 ENCOUNTER — Telehealth: Payer: Medicare HMO | Admitting: Family Medicine

## 2022-05-15 NOTE — Telephone Encounter (Signed)
Verbal orders have been received and will be placed in provider's box for completion.

## 2022-05-15 NOTE — Telephone Encounter (Addendum)
Cyril Mourning from [facility] called to ensure that we received the physical verbal orders listed below as well as plan of care paperwork.   Verbal Orders: Requested this be re-faxed on 07/19 due to not record of receiving this.   Plan of Care: Received on 07/19, confirmed with Kristen.   In the case of not receiving the VO, Cyril Mourning can be contacted at [phone] Ext. [phone].

## 2022-05-17 ENCOUNTER — Encounter (HOSPITAL_BASED_OUTPATIENT_CLINIC_OR_DEPARTMENT_OTHER): Payer: Medicare HMO | Admitting: General Surgery

## 2022-05-17 ENCOUNTER — Ambulatory Visit: Payer: Medicare HMO | Admitting: Family Medicine

## 2022-05-17 DIAGNOSIS — E46 Unspecified protein-calorie malnutrition: Secondary | ICD-10-CM | POA: Diagnosis not present

## 2022-05-17 DIAGNOSIS — L89153 Pressure ulcer of sacral region, stage 3: Secondary | ICD-10-CM | POA: Diagnosis not present

## 2022-05-17 DIAGNOSIS — G20 Parkinson's disease: Secondary | ICD-10-CM | POA: Diagnosis not present

## 2022-05-17 NOTE — Progress Notes (Signed)
REHA, MARTINOVICH ([id number]) Visit Report for 05/17/2022 Arrival Information Details Patient Name: Date of Service: [name] [name], [name] [name] 05/17/2022 2:00 PM Medical Record Number: [id number] Patient Account Number: [id number] Date of Birth/Sex: Treating RN: [birth year] (71 y.o. [name] [name] Primary Care Miley Blanchett: Dimas Chyle Other Clinician: Referring Virginio Isidore: Treating Clell Trahan/Extender: Randalyn Rhea in Treatment: 9 Visit Information History Since Last Visit Added or deleted any medications: No Patient Arrived: Wheel Chair Any new allergies or adverse reactions: No Arrival Time: 14:14 Had a fall or experienced change in No Accompanied By: daughter activities of daily living that may affect Transfer Assistance: Manual risk of falls: Patient Identification Verified: Yes Signs or symptoms of abuse/neglect since last visito No Secondary Verification Process Completed: Yes Hospitalized since last visit: No Patient Requires Transmission-Based Precautions: No Implantable device outside of the clinic excluding No Patient Has Alerts: No cellular tissue based products placed in the center since last visit: Has Dressing in Place as Prescribed: No Pain Present Now: Yes Notes family removed VAC this am Electronic Signature(s) Signed: 05/17/2022 6:12:58 PM By: Baruch Gouty RN, BSN Entered By: Baruch Gouty on 05/17/2022 14:19:31 -------------------------------------------------------------------------------- Encounter Discharge Information Details Patient Name: Date of Service: [name] [name] 05/17/2022 2:00 PM Medical Record Number: [id number] Patient Account Number: [id number] Date of Birth/Sex: Treating RN: Sep 27, 1951 (71 y.o. [name] [name] Primary Care Mende Biswell: Dimas Chyle Other Clinician: Referring Kenetha Cozza: Treating Ewa Hipp/Extender: Randalyn Rhea in Treatment: 9 Encounter Discharge Information Items Discharge  Condition: Stable Ambulatory Status: Wheelchair Discharge Destination: Home Transportation: Private Auto Accompanied By: daughter Schedule Follow-up Appointment: Yes Clinical Summary of Care: Patient Declined Electronic Signature(s) Signed: 05/17/2022 6:12:58 PM By: Baruch Gouty RN, BSN Entered By: Baruch Gouty on 05/17/2022 17:48:48 -------------------------------------------------------------------------------- Lower Extremity Assessment Details Patient Name: Date of Service: [name] [name], [name] [name] 05/17/2022 2:00 PM Medical Record Number: [id number] Patient Account Number: [id number] Date of Birth/Sex: Treating RN: 22-Nov-1950 (71 y.o. [name] [name] Primary Care Jarrett Chicoine: Dimas Chyle Other Clinician: Referring Elder Davidian: Treating Erick Murin/Extender: Randalyn Rhea in Treatment: 9 Electronic Signature(s) Signed: 05/17/2022 6:12:58 PM By: Baruch Gouty RN, BSN Entered By: Baruch Gouty on 05/17/2022 14:20:52 -------------------------------------------------------------------------------- Multi Wound Chart Details Patient Name: Date of Service: [name] [name] 05/17/2022 2:00 PM Medical Record Number: [id number] Patient Account Number: [id number] Date of Birth/Sex: Treating RN: [birth year] (71 y.o. [name] Primary Care Klinton Candelas: Dimas Chyle Other Clinician: Referring Chinonso Linker: Treating Waldo Damian/Extender: Randalyn Rhea in Treatment: 9 Vital Signs Height(in): 67 Pulse(bpm): 80 Weight(lbs): 106 Blood Pressure(mmHg): 113/77 Body Mass Index(BMI): 16.6 Temperature(F): 97.6 Respiratory Rate(breaths/min): 18 Photos: [N/A:N/A] Sacrum N/A N/A Wound Location: Pressure Injury N/A N/A Wounding Event: Pressure Ulcer N/A N/A Primary Etiology: Cataracts, Osteoarthritis N/A N/A Comorbid History: 02/17/2022 N/A N/A Date Acquired: 9 N/A N/A Weeks of Treatment: Open N/A N/A Wound Status: No N/A N/A Wound  Recurrence: 1.1x1x0.6 N/A N/A Measurements L x W x D (cm) 0.864 N/A N/A A (cm) : rea 0.518 N/A N/A Volume (cm) : 86.90% N/A N/A % Reduction in A rea: 97.50% N/A N/A % Reduction in Volume: 12 Starting Position 1 (o'clock): 12 Ending Position 1 (o'clock): 2.8 Maximum Distance 1 (cm): Yes N/A N/A Undermining: Category/Stage III N/A N/A Classification: Medium N/A N/A Exudate A mount: Serosanguineous N/A N/A Exudate Type: red, brown N/A N/A Exudate Color: Well defined, not attached N/A N/A Wound Margin: Large (67-100%) N/A N/A Granulation Amount: Red, Pale N/A N/A Granulation Quality: Small (1-33%) N/A N/A Necrotic Amount: Fat Layer (Subcutaneous Tissue):  Yes N/A N/A Exposed Structures: Fascia: No Tendon: No Muscle: No Joint: No Bone: No None N/A N/A Epithelialization: Chemical Cauterization N/A N/A Procedures Performed: Treatment Notes Electronic Signature(s) Signed: 05/17/2022 2:49:31 PM By: Fredirick Maudlin MD FACS Signed: 05/17/2022 5:26:28 PM By: Adline Peals Entered By: Fredirick Maudlin on 05/17/2022 14:49:31 -------------------------------------------------------------------------------- Multi-Disciplinary Care Plan Details Patient Name: Date of Service: [name] [name]. 05/17/2022 2:00 PM Medical Record Number: [id number] Patient Account Number: [id number] Date of Birth/Sex: Treating RN: [birth year] (71 y.o. [name] [name] Primary Care Angelise Petrich: Dimas Chyle Other Clinician: Referring Sarim Rothman: Treating Annalisa Colonna/Extender: Randalyn Rhea in Treatment: 9 Multidisciplinary Care Plan reviewed with physician Active Inactive Pressure Nursing Diagnoses: Knowledge deficit related to causes and risk factors for pressure ulcer development Knowledge deficit related to management of pressures ulcers Potential for impaired tissue integrity related to pressure, friction, moisture, and shear Goals: Patient/caregiver will  verbalize risk factors for pressure ulcer development Date Initiated: 03/12/2022 Date Inactivated: 04/11/2022 Target Resolution Date: 04/12/2022 Goal Status: Met Patient/caregiver will verbalize understanding of pressure ulcer management Date Initiated: 03/12/2022 Target Resolution Date: 05/22/2022 Goal Status: Active Interventions: Assess: immobility, friction, shearing, incontinence upon admission and as needed Assess offloading mechanisms upon admission and as needed Assess potential for pressure ulcer upon admission and as needed Provide education on pressure ulcers Notes: Wound/Skin Impairment Nursing Diagnoses: Impaired tissue integrity Knowledge deficit related to ulceration/compromised skin integrity Goals: Patient/caregiver will verbalize understanding of skin care regimen Date Initiated: 03/12/2022 Target Resolution Date: 06/14/2022 Goal Status: Active Interventions: Assess patient/caregiver ability to obtain necessary supplies Assess patient/caregiver ability to perform ulcer/skin care regimen upon admission and as needed Assess ulceration(s) every visit Provide education on ulcer and skin care Notes: Electronic Signature(s) Signed: 05/17/2022 6:12:58 PM By: Baruch Gouty RN, BSN Entered By: Baruch Gouty on 05/17/2022 14:35:09 -------------------------------------------------------------------------------- Pain Assessment Details Patient Name: Date of Service: [name] [name] 05/17/2022 2:00 PM Medical Record Number: [id number] Patient Account Number: [id number] Date of Birth/Sex: Treating RN: [birth year] (71 y.o. [name] [name] Primary Care Brandan Robicheaux: Dimas Chyle Other Clinician: Referring Alvah Lagrow: Treating Sherman Donaldson/Extender: Randalyn Rhea in Treatment: 9 Active Problems Location of Pain Severity and Description of Pain Patient Has Paino Yes Site Locations Pain Location: Pain in Ulcers With Dressing Change: Yes Duration of the  Pain. Constant / Intermittento Intermittent Rate the pain. Current Pain Level: 0 Worst Pain Level: 5 Least Pain Level: 0 Character of Pain Describe the Pain: Aching Pain Management and Medication Current Pain Management: Medication: Yes Other: reposition Is the Current Pain Management Adequate: Adequate How does your wound impact your activities of daily livingo Sleep: Yes Bathing: No Appetite: No Relationship With Others: No Bladder Continence: No Emotions: No Bowel Continence: No Work: No Toileting: No Drive: No Dressing: No Hobbies: No Electronic Signature(s) Signed: 05/17/2022 6:12:58 PM By: Baruch Gouty RN, BSN Entered By: Baruch Gouty on 05/17/2022 14:20:16 -------------------------------------------------------------------------------- Patient/Caregiver Education Details Patient Name: Date of Service: [name] [name] 7/21/2023andnbsp2:00 PM Medical Record Number: [id number] Patient Account Number: [id number] Date of Birth/Gender: Treating RN: 1951/03/26 (71 y.o. [name] [name] Primary Care Physician: Dimas Chyle Other Clinician: Referring Physician: Treating Physician/Extender: Randalyn Rhea in Treatment: 9 Education Assessment Education Provided To: Patient Education Topics Provided Pressure: Methods: Explain/Verbal Responses: Reinforcements needed, State content correctly Wound/Skin Impairment: Methods: Explain/Verbal Responses: Reinforcements needed, State content correctly Electronic Signature(s) Signed: 05/17/2022 6:12:58 PM By: Baruch Gouty RN, BSN Entered By: Baruch Gouty on 05/17/2022 14:35:24 -------------------------------------------------------------------------------- Wound Assessment Details Patient Name: Date of Service:  [name] [name], [name] [name] 05/17/2022 2:00 PM Medical Record Number: [id number] Patient Account Number: [id number] Date of Birth/Sex: Treating RN: [birth year] (71 y.o. [name] [name] Primary Care Bryli Mantey: Dimas Chyle Other Clinician: Referring Aziel Morgan: Treating Vadhir Mcnay/Extender: Randalyn Rhea in Treatment: 9 Wound Status Wound Number: 2 Primary Etiology: Pressure Ulcer Wound Location: Sacrum Wound Status: Open Wounding Event: Pressure Injury Comorbid History: Cataracts, Osteoarthritis Date Acquired: 02/17/2022 Weeks Of Treatment: 9 Clustered Wound: No Photos Wound Measurements Length: (cm) 1.1 Width: (cm) 1 Depth: (cm) 0.6 Area: (cm) 0.864 Volume: (cm) 0.518 % Reduction in Area: 86.9% % Reduction in Volume: 97.5% Epithelialization: None Tunneling: No Undermining: Yes Starting Position (o'clock): 12 Ending Position (o'clock): 12 Maximum Distance: (cm) 2.8 Wound Description Classification: Category/Stage III Wound Margin: Well defined, not attached Exudate Amount: Medium Exudate Type: Serosanguineous Exudate Color: red, brown Foul Odor After Cleansing: No Slough/Fibrino No Wound Bed Granulation Amount: Large (67-100%) Exposed Structure Granulation Quality: Red, Pale Fascia Exposed: No Necrotic Amount: Small (1-33%) Fat Layer (Subcutaneous Tissue) Exposed: Yes Necrotic Quality: Adherent Slough Tendon Exposed: No Muscle Exposed: No Joint Exposed: No Bone Exposed: No Treatment Notes Wound #2 (Sacrum) Cleanser Soap and Water Discharge Instruction: May shower and wash wound with dial antibacterial soap and water prior to dressing change. Wound Cleanser Discharge Instruction: Cleanse the wound with wound cleanser prior to applying a clean dressing using gauze sponges, not tissue or cotton balls. Peri-Wound Care Skin Prep Discharge Instruction: Use skin prep as directed Topical Primary Dressing Dakin's Solution 0.25%, 16 (oz) Discharge Instruction: Moisten gauze with Dakin's solution and pack lightly into wound in clinic Secondary Dressing ALLEVYN Gentle Border, 4x4 (in/in) Discharge Instruction: Apply  over primary dressing as directed. Secured With Compression Wrap Compression Stockings Add-Ons Electronic Signature(s) Signed: 05/17/2022 5:26:28 PM By: Adline Peals Signed: 05/17/2022 6:12:58 PM By: Baruch Gouty RN, BSN Entered By: Adline Peals on 05/17/2022 14:30:59 -------------------------------------------------------------------------------- Vitals Details Patient Name: Date of Service: [name] [name] 05/17/2022 2:00 PM Medical Record Number: [id number] Patient Account Number: [id number] Date of Birth/Sex: Treating RN: July 07, 1951 (71 y.o. [name] [name] Primary Care Lounette Sloan: Dimas Chyle Other Clinician: Referring Randi [employer]: Treating Cheryn Lundquist/Extender: Randalyn Rhea in Treatment: 9 Vital Signs Time Taken: 14:20 Temperature (F): 97.6 Height (in): 67 Pulse (bpm): 80 Weight (lbs): 106 Respiratory Rate (breaths/min): 18 Body Mass Index (BMI): 16.6 Blood Pressure (mmHg): 113/77 Reference Range: 80 - 120 mg / dl Electronic Signature(s) Signed: 05/17/2022 6:12:58 PM By: Baruch Gouty RN, BSN Entered By: Baruch Gouty on 05/17/2022 14:20:42

## 2022-05-17 NOTE — Progress Notes (Signed)
[name], [name] ([id number]) Visit Report for 05/17/2022 Chief Complaint Document Details Patient Name: Date of Service: [name], [name] 05/17/2022 2:00 PM Medical Record Number: [id number] Patient Account Number: [id number] Date of Birth/Sex: Treating RN: 1951/08/30 (71 y.o. [name] [name] Primary Care Provider: Dimas Chyle Other Clinician: Referring Provider: Treating Provider/Extender: Randalyn Rhea in Treatment: 9 Information Obtained from: Patient Chief Complaint The patient presents to the wound care center today for evaluation of a stage III sacral pressure ulcer as well as a dehisced surgical wound from an emergent femoral hernia repair. Electronic Signature(s) Signed: 05/17/2022 2:49:37 PM By: Fredirick Maudlin MD FACS Entered By: Fredirick Maudlin on 05/17/2022 14:49:37 -------------------------------------------------------------------------------- HPI Details Patient Name: Date of Service: [name] [name] 05/17/2022 2:00 PM Medical Record Number: [id number] Patient Account Number: [id number] Date of Birth/Sex: Treating RN: October 20, 1951 (71 y.o. [name] [name] Primary Care Provider: Dimas Chyle Other Clinician: Referring Provider: Treating Provider/Extender: Randalyn Rhea in Treatment: 9 History of Present Illness HPI Description: ADMISSION 03/12/2022 This is a 71 year old woman who underwent an emergency femoral hernia repair secondary to strangulated small bowel. She required a bowel resection and reanastomosis. While she was in the hospital, the wound dehisced and she was initially felt to have an enterocutaneous fistula. They were managing the effluent with an ostomy bag. She saw her general surgeon on May 11. At that time, there was no evidence of EC fistula and the wound was closing in nicely. This is currently being managed with silver alginate. Also during her hospitalization, she unfortunately developed  a pressure ulcer. This is currently stage III. She is in a skilled nursing facility at the moment but will be discharged on Sunday. This wound is being managed with Dakin's wet to dry dressing changes. She is not a diabetic, nor does she smoke. Her only real medical history is Parkinson's disease and fairly significant protein-calorie malnutrition. 04/04/2022: The inguinal wound is closed. The sacral wound is a little bit smaller today. It is clean without significant slough or odor. It continues to undermine for about 4 to 5 cm in the cranial direction. 04/11/2022: The orifice of her sacral wound continues to contract. She still has extensive undermining present. The wound is clean without slough or odor. She did not receive a wound VAC yet because her insurance has a contract with a different provider than the one we usually use. 04/18/2022: The wound is contracting but still has a deep area that is undermined. Apparently the cost for the wound VAC that she would be responsible for is prohibitive. She currently has Medicaid pending. The wound surface is pink and healthy-looking. There is just a little bit of slough and biofilm on the surface. 04/25/2022: The patient and her family decided they wanted to go ahead with wound VAC application. It has been delivered to their home, but they did not bring it with them because they understood that home health would initiate the therapy. Today, the wound surface looks clean and viable with good granulation tissue, but there is a musty odor and blue-green drainage on her dressing, consistent with Pseudomonas aeruginosa. 05/02/2022: For unclear reasons, the patient was told that the levofloxacin that I prescribed was a sulfa derived antibiotic and she did not take it. It does not make sense and I am not sure how that happened, but I reiterated to the patient that it is not a sulfa derived antibiotic and she can go ahead and take the medication. The wound VAC was  initiated by home health on Monday. Unfortunately, it appears that they are not placing the foam all the way up into the undermined portion of the wound, as there is just a small button of foam identified when the wound VAC was removed today. There is some slough accumulation, as one might expect, within the wound bed. The periwound skin is intact. 05/17/2022: The wound VAC malfunctioned and was removed yesterday. On inspection, there was still a piece of sponge left in situ. It was removed and was a tiny piece of white sponge, clearly inadequate to fill the extensive undermining at the cranial portion of the wound. There has been no improvement in the undermining. She has hypertrophic granulation tissue at the orifice of the wound. Periwound skin is in good condition. Electronic Signature(s) Signed: 05/17/2022 2:52:33 PM By: Fredirick Maudlin MD FACS Entered By: Fredirick Maudlin on 05/17/2022 14:52:33 -------------------------------------------------------------------------------- Chemical Cauterization Details Patient Name: Date of Service: [name] [name] 05/17/2022 2:00 PM Medical Record Number: [id number] Patient Account Number: [id number] Date of Birth/Sex: Treating RN: 1951-10-28 (71 y.o. [name] [name] Primary Care Provider: Dimas Chyle Other Clinician: Referring Provider: Treating Provider/Extender: Randalyn Rhea in Treatment: 9 Procedure Performed for: Wound #2 Sacrum Performed By: Physician Fredirick Maudlin, MD Post Procedure Diagnosis Same as Pre-procedure Notes using silver nitrate stick Electronic Signature(s) Signed: 05/17/2022 2:58:58 PM By: Fredirick Maudlin MD FACS Signed: 05/17/2022 6:12:58 PM By: Baruch Gouty RN, BSN Entered By: Baruch Gouty on 05/17/2022 14:42:03 -------------------------------------------------------------------------------- Physical Exam Details Patient Name: Date of Service: [name] [name] 05/17/2022 2:00  PM Medical Record Number: [id number] Patient Account Number: [id number] Date of Birth/Sex: Treating RN: 1951-08-26 (71 y.o. [name] [name] Primary Care Provider: Dimas Chyle Other Clinician: Referring Provider: Treating Provider/Extender: Randalyn Rhea in Treatment: 9 Constitutional . . . . No acute distress.Marland Kitchen Respiratory Normal work of breathing on room air.. Notes 05/17/2022: On inspection, there was still a piece of sponge left in situ. It was removed and was a tiny piece of white sponge, clearly inadequate to fill the extensive undermining at the cranial portion of the wound. There has been no improvement in the undermining. She has hypertrophic granulation tissue at the orifice of the wound. Periwound skin is in good condition. Electronic Signature(s) Signed: 05/17/2022 2:56:12 PM By: Fredirick Maudlin MD FACS Entered By: Fredirick Maudlin on 05/17/2022 14:56:12 -------------------------------------------------------------------------------- Physician Orders Details Patient Name: Date of Service: [name] [name] 05/17/2022 2:00 PM Medical Record Number: [id number] Patient Account Number: [id number] Date of Birth/Sex: Treating RN: 24-Sep-1951 (71 y.o. [name] [name] Primary Care Provider: Dimas Chyle Other Clinician: Referring Provider: Treating Provider/Extender: Randalyn Rhea in Treatment: 9 Verbal / Phone Orders: No Diagnosis Coding ICD-10 Coding Code Description L89.153 Pressure ulcer of sacral region, stage 3 G20 Parkinson's disease Follow-up Appointments ppointment in 1 week. - Dr. Celine Ahr - Room 1- Thursday 7/13 @ 11:15am Return A Other: - Try and increase protein intake-Premier Protein Shakes are 30g Protein,goal is 100g a day Bathing/ Shower/ Hygiene May shower with protection but do not get wound dressing(s) wet. Negative Presssure Wound Therapy Wound #2 Sacrum Wound Vac to wound continuously at  167m/hg pressure - [facility] be changed by home health Mon, Wed, Fri Black Foam - use only black foam, be sure to get black spone into undermining at 12 o'clock Off-Loading Low air-loss mattress (Group 2) Turn and reposition every [facility]  [facility]; continue Home Health for wound care. May utilize formulary equivalent dressing for wound treatment orders unless otherwise specified. - use one piece of black foam to fill to undermined area. no white foam. May hold VAC until Monday 7/24 Dressing changes to be completed by [facility] on Monday / Wednesday / Friday except when patient has scheduled visit at [facility]. Other Home Health Orders/Instructions: - [facility] Wound Treatment Wound #2 - Sacrum Cleanser: Soap and Water 1 x Per Day/Other:while VAC is not in use Discharge Instructions: May shower and wash wound with dial antibacterial soap and water prior to dressing change. Cleanser: Wound Cleanser 1 x Per Day/Other:while VAC is not in use Discharge Instructions: Cleanse the wound with wound cleanser prior to applying a clean dressing using gauze sponges, not tissue or cotton balls. Peri-Wound Care: Skin Prep 1 x Per Day/Other:while VAC is not in use Discharge Instructions: Use skin prep as directed Prim Dressing: Dakin's Solution 0.25%, 16 (oz) 1 x Per Day/Other:while VAC is not in use ary Discharge Instructions: Moisten gauze with Dakin's solution and pack lightly into wound in clinic Secondary Dressing: ALLEVYN Gentle Border, 4x4 (in/in) 1 x Per Day/Other:while VAC is not in use Discharge Instructions: Apply over primary dressing as directed. Electronic Signature(s) Signed: 05/17/2022 2:58:58 PM By: Fredirick Maudlin MD FACS Entered By: Fredirick Maudlin on 05/17/2022 14:56:19 -------------------------------------------------------------------------------- Problem List Details Patient Name: Date of  Service: [name] [name] 05/17/2022 2:00 PM Medical Record Number: [id number] Patient Account Number: [id number] Date of Birth/Sex: Treating RN: 29-Mar-1951 (71 y.o. [name] [name] Primary Care Provider: Dimas Chyle Other Clinician: Referring Provider: Treating Provider/Extender: Randalyn Rhea in Treatment: 9 Active Problems ICD-10 Encounter Code Description Active Date MDM Diagnosis L89.153 Pressure ulcer of sacral region, stage 3 03/12/2022 No Yes G20 Parkinson's disease 03/12/2022 No Yes Inactive Problems ICD-10 Code Description Active Date Inactive Date T81.31XA Disruption of external operation (surgical) wound, not elsewhere classified, initial 03/12/2022 03/12/2022 encounter Resolved Problems Electronic Signature(s) Signed: 05/17/2022 2:49:24 PM By: Fredirick Maudlin MD FACS Entered By: Fredirick Maudlin on 05/17/2022 14:49:24 -------------------------------------------------------------------------------- Progress Note Details Patient Name: Date of Service: [name] [name] 05/17/2022 2:00 PM Medical Record Number: [id number] Patient Account Number: [id number] Date of Birth/Sex: Treating RN: 05-01-1951 (71 y.o. [name] [name] Primary Care Provider: Dimas Chyle Other Clinician: Referring Provider: Treating Provider/Extender: Randalyn Rhea in Treatment: 9 Subjective Chief Complaint Information obtained from Patient The patient presents to the wound care center today for evaluation of a stage III sacral pressure ulcer as well as a dehisced surgical wound from an emergent femoral hernia repair. History of Present Illness (HPI) ADMISSION 03/12/2022 This is a 71 year old woman who underwent an emergency femoral hernia repair secondary to strangulated small bowel. She required a bowel resection and reanastomosis. While she was in the hospital, the wound dehisced and she was initially felt to have an enterocutaneous  fistula. They were managing the effluent with an ostomy bag. She saw her general surgeon on May 11. At that time, there was no evidence of EC fistula and the wound was closing in nicely. This is currently being managed with silver alginate. Also during her hospitalization, she unfortunately developed a pressure ulcer. This is currently stage III. She is in a skilled nursing facility at the moment but will be discharged on Sunday. This wound is being managed with Dakin's wet to dry dressing changes. She is not a diabetic, nor does she smoke. Her only real medical history is  Parkinson's disease and fairly significant protein-calorie malnutrition. 04/04/2022: The inguinal wound is closed. The sacral wound is a little bit smaller today. It is clean without significant slough or odor. It continues to undermine for about 4 to 5 cm in the cranial direction. 04/11/2022: The orifice of her sacral wound continues to contract. She still has extensive undermining present. The wound is clean without slough or odor. She did not receive a wound VAC yet because her insurance has a contract with a different provider than the one we usually use. 04/18/2022: The wound is contracting but still has a deep area that is undermined. Apparently the cost for the wound VAC that she would be responsible for is prohibitive. She currently has Medicaid pending. The wound surface is pink and healthy-looking. There is just a little bit of slough and biofilm on the surface. 04/25/2022: The patient and her family decided they wanted to go ahead with wound VAC application. It has been delivered to their home, but they did not bring it with them because they understood that home health would initiate the therapy. Today, the wound surface looks clean and viable with good granulation tissue, but there is a musty odor and blue-green drainage on her dressing, consistent with Pseudomonas aeruginosa. 05/02/2022: For unclear reasons, the patient was  told that the levofloxacin that I prescribed was a sulfa derived antibiotic and she did not take it. It does not make sense and I am not sure how that happened, but I reiterated to the patient that it is not a sulfa derived antibiotic and she can go ahead and take the medication. The wound VAC was initiated by home health on Monday. Unfortunately, it appears that they are not placing the foam all the way up into the undermined portion of the wound, as there is just a small button of foam identified when the wound VAC was removed today. There is some slough accumulation, as one might expect, within the wound bed. The periwound skin is intact. 05/17/2022: The wound VAC malfunctioned and was removed yesterday. On inspection, there was still a piece of sponge left in situ. It was removed and was a tiny piece of white sponge, clearly inadequate to fill the extensive undermining at the cranial portion of the wound. There has been no improvement in the undermining. She has hypertrophic granulation tissue at the orifice of the wound. Periwound skin is in good condition. Patient History Information obtained from Patient, Caregiver. Family History Cancer - Mother,Father,Siblings, Hypertension - Siblings,Child. Social History Never smoker, Marital Status - Divorced, Alcohol Use - Never, Drug Use - No History, Caffeine Use - Moderate - Coffee, soda. Medical History Eyes Patient has history of Cataracts Musculoskeletal Patient has history of Osteoarthritis Medical A Surgical History Notes nd Gastrointestinal GERD Musculoskeletal degenerative disc disease Objective Constitutional No acute distress.. Vitals Time Taken: 2:20 PM, Height: 67 in, Weight: 106 lbs, BMI: 16.6, Temperature: 97.6 F, Pulse: 80 bpm, Respiratory Rate: 18 breaths/min, Blood Pressure: 113/77 mmHg. Respiratory Normal work of breathing on room air.. General Notes: 05/17/2022: On inspection, there was still a piece of sponge left in  situ. It was removed and was a tiny piece of white sponge, clearly inadequate to fill the extensive undermining at the cranial portion of the wound. There has been no improvement in the undermining. She has hypertrophic granulation tissue at the orifice of the wound. Periwound skin is in good condition. Integumentary (Hair, Skin) Wound #2 status is Open. Original cause of wound was Pressure Injury.  The date acquired was: 02/17/2022. The wound has been in treatment 9 weeks. The wound is located on the Sacrum. The wound measures 1.1cm length x 1cm width x 0.6cm depth; 0.864cm^2 area and 0.518cm^3 volume. There is Fat Layer (Subcutaneous Tissue) exposed. There is no tunneling noted, however, there is undermining starting at 12:00 and ending at 12:00 with a maximum distance of 2.8cm. There is a medium amount of serosanguineous drainage noted. The wound margin is well defined and not attached to the wound base. There is large (67- 100%) red, pale granulation within the wound bed. There is a small (1-33%) amount of necrotic tissue within the wound bed including Adherent Slough. Assessment Active Problems ICD-10 Pressure ulcer of sacral region, stage 3 Parkinson's disease Procedures Wound #2 Pre-procedure diagnosis of Wound #2 is a Pressure Ulcer located on the Sacrum . An Chemical Cauterization procedure was performed by Fredirick Maudlin, MD. Post procedure Diagnosis Wound #2: Same as Pre-Procedure Notes: using silver nitrate stick Plan Follow-up Appointments: Return Appointment in 1 week. - Dr. Celine Ahr - Room 1- Thursday 7/13 @ 11:15am Other: - Try and increase protein intake-Premier Protein Shakes are 30g Protein,goal is 100g a day Bathing/ Shower/ Hygiene: May shower with protection but do not get wound dressing(s) wet. Negative Presssure Wound Therapy: Wound #2 Sacrum: Wound Vac to wound continuously at 145m/hg pressure - [facility] be changed by  home health Mon, Wed, Fri Black Foam - use only black foam, be sure to get black spone into undermining at 12 o'clock Off-Loading: Low air-loss mattress (Group 2) Turn and reposition every 2 hours Home Health: New wound care orders this week; continue Home Health for wound care. May utilize formulary equivalent dressing for wound treatment orders unless otherwise specified. - use one piece of black foam to fill to undermined area. no white foam. May hold VAC until Monday 7/24 [place] Friday [facility] Other Home Health Orders/Instructions: - [facility] WOUND #2: - Sacrum Wound Laterality: Cleanser: Soap and Water 1 x Per Day/Other:while VAC is not in use Discharge Instructions: May shower and wash wound with dial antibacterial soap and water prior to dressing change. Cleanser: Wound Cleanser 1 x Per Day/Other:while VAC is not in use Discharge Instructions: Cleanse the wound with wound cleanser prior to applying a clean dressing using gauze sponges, not tissue or cotton balls. Peri-Wound Care: Skin Prep 1 x Per Day/Other:while VAC is not in use Discharge Instructions: Use skin prep as directed Prim Dressing: Dakin's Solution 0.25%, 16 (oz) 1 x Per Day/Other:while VAC is not in use ary Discharge Instructions: Moisten gauze with Dakin's solution and pack lightly into wound in clinic Secondary Dressing: ALLEVYN Gentle Border, 4x4 (in/in) 1 x Per Day/Other:while VAC is not in use Discharge Instructions: Apply over primary dressing as directed. 05/17/2022: On inspection, there was still a piece of sponge left in situ. It was removed and was a tiny piece of white sponge, clearly inadequate to fill the extensive undermining at the cranial portion of the wound. There has been no improvement in the undermining. She has hypertrophic granulation tissue at the orifice of the wound. Periwound  skin is in good condition. No debridement was necessary today. I used silver nitrate to chemically cauterize the hypertrophic granulation tissue at the orifice of the wound. I think we are not getting the benefit from the wound  VAC that we should be, secondary to poor sponge placement and continued use of white sponge, despite request to use black. We discussed with the patient and her daughter that it is imperative that the home health nurses use black sponge exclusively and fill the entire space of the undermined tissue; we simply will not be able to get this to contract without appropriate use of the VAC. They did request a reprieve from the wound VAC over the weekend. Home health will reapply it on Monday. We will have her use Dakin's wet-to-dry dressing changes until Monday. We emphasized to the patient's daughter that the dressing needs to be packed all the way into the undermined portion of her wound. She will follow-up here in 1 week's time. Electronic Signature(s) Signed: 05/17/2022 2:58:01 PM By: Fredirick Maudlin MD FACS Entered By: Fredirick Maudlin on 05/17/2022 14:58:01 -------------------------------------------------------------------------------- HxROS Details Patient Name: Date of Service: [name] [name] 05/17/2022 2:00 PM Medical Record Number: [id number] Patient Account Number: [id number] Date of Birth/Sex: Treating RN: 03-26-1951 (71 y.o. [name] [name] Primary Care Provider: Dimas Chyle Other Clinician: Referring Provider: Treating Provider/Extender: Randalyn Rhea in Treatment: 9 Information Obtained From Patient Caregiver Eyes Medical History: Positive for: Cataracts Gastrointestinal Medical History: Past Medical History Notes: GERD Musculoskeletal Medical History: Positive for: Osteoarthritis Past Medical History Notes: degenerative disc disease HBO Extended History Items Eyes: Cataracts Immunizations Pneumococcal  Vaccine: Received Pneumococcal Vaccination: Yes Received Pneumococcal Vaccination On or After 60th Birthday: No Implantable Devices None Family and Social History Cancer: Yes - Mother,Father,Siblings; Hypertension: Yes - Siblings,Child; Never smoker; Marital Status - Divorced; Alcohol Use: Never; Drug Use: No History; Caffeine Use: Moderate - Coffee, soda; Financial Concerns: No; Food, Clothing or Shelter Needs: No; Support System Lacking: No; Transportation Concerns: No Electronic Signature(s) Signed: 05/17/2022 2:58:58 PM By: Fredirick Maudlin MD FACS Signed: 05/17/2022 5:26:28 PM By: Adline Peals Entered By: Fredirick Maudlin on 05/17/2022 14:52:38 -------------------------------------------------------------------------------- SuperBill Details Patient Name: Date of Service: [name] [name] 05/17/2022 Medical Record Number: [id number] Patient Account Number: [id number] Date of Birth/Sex: Treating RN: May 10, 1951 (71 y.o. [name] [name] Primary Care Provider: Dimas Chyle Other Clinician: Referring Provider: Treating Provider/Extender: Randalyn Rhea in Treatment: 9 Diagnosis Coding ICD-10 Codes Code Description [phone] Pressure ulcer of sacral region, stage 3 G20 Parkinson's disease Facility Procedures CPT4 Code: [id number] Description: 36629 - CHEM CAUT GRANULATION TISS ICD-10 Diagnosis Description L89.153 Pressure ulcer of sacral region, stage 3 Modifier: Quantity: 1 Physician Procedures : CPT4 Code Description Modifier [id number] - WC PHYS LEVEL 4 - EST PT ICD-10 Diagnosis Description L89.153 Pressure ulcer of sacral region, stage 3 G20 Parkinson's disease Quantity: 1 : [id number] - WC PHYS CHEM CAUT GRAN TISSUE ICD-10 Diagnosis Description L89.153 Pressure ulcer of sacral region, stage 3 Quantity: 1 Electronic Signature(s) Signed: 05/17/2022 2:58:19 PM By: Fredirick Maudlin MD FACS Entered By: Fredirick Maudlin on 05/17/2022  14:58:19

## 2022-05-20 ENCOUNTER — Telehealth: Payer: Self-pay | Admitting: Family Medicine

## 2022-05-20 NOTE — Telephone Encounter (Signed)
.  Home Health Certification or Plan of Care Tracking  Is this a Certification or Plan of Care? yes  [facility] Agency: [place]  Order Number:  [id number]  Has charge sheet been attached? yes  Where has form been placed:  In provider's box  Faxed to:   [phone]

## 2022-05-21 ENCOUNTER — Encounter: Payer: Self-pay | Admitting: Family Medicine

## 2022-05-21 ENCOUNTER — Telehealth (INDEPENDENT_AMBULATORY_CARE_PROVIDER_SITE_OTHER): Payer: Medicare HMO | Admitting: Family Medicine

## 2022-05-21 VITALS — Ht 67.0 in | Wt 130.0 lb

## 2022-05-21 DIAGNOSIS — L89151 Pressure ulcer of sacral region, stage 1: Secondary | ICD-10-CM

## 2022-05-21 DIAGNOSIS — G20 Parkinson's disease: Secondary | ICD-10-CM

## 2022-05-21 DIAGNOSIS — E43 Unspecified severe protein-calorie malnutrition: Secondary | ICD-10-CM

## 2022-05-21 DIAGNOSIS — R5381 Other malaise: Secondary | ICD-10-CM

## 2022-05-21 NOTE — Progress Notes (Signed)
   [name] [name] is a 71 y.o. female who presents today for a virtual office visit.  Assessment/Plan:  Chronic Problems Addressed Today: Parkinson disease ([place]) She has established with a new neurologist.  She is currently on Sinemet 25-250 four times daily.  She does have some decreased mobility that is impacting her activities of daily living.  She has done well with PT in the past.  We will place referral for home health PT.  Debility Multifactorial.  She does have underlying Parkinson disease and protein calorie malnutrition which is contributing.  We will refer to PT for strengthening exercises.  Pressure injury of coccygeal region, stage 1 Continue management per wound care.  Protein-calorie malnutrition, severe She has done a great job increasing her protein and caloric intake.  Her BMI is 20 today.  She is up about 20 pounds over the last couple of months.     Subjective:  HPI:  See A/P for status of chronic conditions.  Patient went to the ED on 04/17/2022 with abdominal pain and generalized weakness.  Had labs there which were negative.  Also had a CT scan which was negative.  She was discharged home in stable condition. She has not had any recurrence of symptoms since then.        Objective/Observations  Physical Exam: Gen: NAD, resting comfortably Pulm: Normal work of breathing Neuro: Grossly normal, moves all extremities Psych: Normal affect and thought content  Virtual Visit via Video   I connected with [name] [name] on 05/21/22 at  2:20 PM EDT by a video enabled telemedicine application and verified that I am speaking with the correct person using two identifiers. The limitations of evaluation and management by telemedicine and the availability of in person appointments were discussed. The patient expressed understanding and agreed to proceed.   Patient location: Home Provider location: [place] participating in the virtual  visit: Myself and Patient     Algis Greenhouse. Jerline Pain, MD 05/21/2022 2:56 PM

## 2022-05-21 NOTE — Assessment & Plan Note (Signed)
Multifactorial.  She does have underlying Parkinson disease and protein calorie malnutrition which is contributing.  We will refer to PT for strengthening exercises.

## 2022-05-21 NOTE — Assessment & Plan Note (Signed)
Continue management per wound care.

## 2022-05-21 NOTE — Telephone Encounter (Signed)
Form placed in PCP office.

## 2022-05-21 NOTE — Assessment & Plan Note (Signed)
She has established with a new neurologist.  She is currently on Sinemet 25-250 four times daily.  She does have some decreased mobility that is impacting her activities of daily living.  She has done well with PT in the past.  We will place referral for home health PT.

## 2022-05-21 NOTE — Assessment & Plan Note (Signed)
She has done a great job increasing her protein and caloric intake.  Her BMI is 20 today.  She is up about 20 pounds over the last couple of months.

## 2022-05-24 ENCOUNTER — Encounter (HOSPITAL_BASED_OUTPATIENT_CLINIC_OR_DEPARTMENT_OTHER): Payer: Medicare HMO | Admitting: General Surgery

## 2022-05-24 DIAGNOSIS — L89153 Pressure ulcer of sacral region, stage 3: Secondary | ICD-10-CM | POA: Diagnosis not present

## 2022-05-24 DIAGNOSIS — E46 Unspecified protein-calorie malnutrition: Secondary | ICD-10-CM | POA: Diagnosis not present

## 2022-05-24 DIAGNOSIS — L98492 Non-pressure chronic ulcer of skin of other sites with fat layer exposed: Secondary | ICD-10-CM | POA: Diagnosis not present

## 2022-05-24 DIAGNOSIS — G20 Parkinson's disease: Secondary | ICD-10-CM | POA: Diagnosis not present

## 2022-05-24 NOTE — Progress Notes (Signed)
DAISY, LITES ([id number]) Visit Report for 05/24/2022 Arrival Information Details Patient Name: Date of Service: [name] [name], [name] [name] 05/24/2022 2:00 PM Medical Record Number: [id number] Patient Account Number: [id number] Date of Birth/Sex: Treating RN: 14-Sep-1951 (71 y.o. F) Primary Care Elynn Patteson: Dimas Chyle Other Clinician: Referring Zaria Taha: Treating Laisha Rau/Extender: Randalyn Rhea in Treatment: 10 Visit Information History Since Last Visit All ordered tests and consults were completed: Yes Patient Arrived: Wheel Chair Added or deleted any medications: No Arrival Time: 14:29 Any new allergies or adverse reactions: No Accompanied By: daughter Had a fall or experienced change in No Transfer Assistance: None activities of daily living that may affect Patient Identification Verified: Yes risk of falls: Secondary Verification Process Completed: Yes Signs or symptoms of abuse/neglect since last visito No Patient Requires Transmission-Based Precautions: No Has Dressing in Place as Prescribed: Yes Patient Has Alerts: No Pain Present Now: No Electronic Signature(s) Signed: 05/24/2022 5:22:04 PM By: Blanche East RN Entered By: Blanche East on 05/24/2022 14:30:47 -------------------------------------------------------------------------------- Encounter Discharge Information Details Patient Name: Date of Service: [name] [name] 05/24/2022 2:00 PM Medical Record Number: [id number] Patient Account Number: [id number] Date of Birth/Sex: Treating RN: [birth year] (71 y.o. F) Primary Care Griffon Herberg: Dimas Chyle Other Clinician: Referring Zuha Dejonge: Treating Aliyah Abeyta/Extender: Randalyn Rhea in Treatment: 10 Encounter Discharge Information Items Discharge Condition: Stable Ambulatory Status: Wheelchair Discharge Destination: Home Transportation: Private Auto Accompanied By: daughter Schedule Follow-up Appointment: Yes Clinical Summary  of Care: Electronic Signature(s) Signed: 05/24/2022 5:22:04 PM By: Blanche East RN Entered By: Blanche East on 05/24/2022 14:51:54 -------------------------------------------------------------------------------- Lower Extremity Assessment Details Patient Name: Date of Service: [name] [name], [name] [name] 05/24/2022 2:00 PM Medical Record Number: [id number] Patient Account Number: [id number] Date of Birth/Sex: Treating RN: 30-Jul-1951 (71 y.o. F) Primary Care Temari Schooler: Dimas Chyle Other Clinician: Referring Nygeria Lager: Treating Thomos Domine/Extender: Randalyn Rhea in Treatment: 10 Electronic Signature(s) Signed: 05/24/2022 5:22:04 PM By: Blanche East RN Entered By: Blanche East on 05/24/2022 14:35:44 -------------------------------------------------------------------------------- Multi Wound Chart Details Patient Name: Date of Service: [name] [name] 05/24/2022 2:00 PM Medical Record Number: [id number] Patient Account Number: [id number] Date of Birth/Sex: Treating RN: 1950-11-28 (71 y.o. F) Primary Care Zen Cedillos: Dimas Chyle Other Clinician: Referring Laine Fonner: Treating Philip Eckersley/Extender: Randalyn Rhea in Treatment: 10 Vital Signs Height(in): 67 Pulse(bpm): 84 Weight(lbs): 106 Blood Pressure(mmHg): 124/79 Body Mass Index(BMI): 16.6 Temperature(F): 97.6 Respiratory Rate(breaths/min): 18 Photos: [N/A:N/A] Sacrum N/A N/A Wound Location: Pressure Injury N/A N/A Wounding Event: Pressure Ulcer N/A N/A Primary Etiology: Cataracts, Osteoarthritis N/A N/A Comorbid History: 02/17/2022 N/A N/A Date Acquired: 10 N/A N/A Weeks of Treatment: Open N/A N/A Wound Status: No N/A N/A Wound Recurrence: 0.9x0.9x1 N/A N/A Measurements L x W x D (cm) 0.636 N/A N/A A (cm) : rea 0.636 N/A N/A Volume (cm) : 90.40% N/A N/A % Reduction in A rea: 97.00% N/A N/A % Reduction in Volume: 9 Starting Position 1 (o'clock): 12 Ending Position 1  (o'clock): 2 Maximum Distance 1 (cm): Yes N/A N/A Undermining: Category/Stage III N/A N/A Classification: Medium N/A N/A Exudate A mount: Serosanguineous N/A N/A Exudate Type: red, brown N/A N/A Exudate Color: Well defined, not attached N/A N/A Wound Margin: Large (67-100%) N/A N/A Granulation A mount: Red, Pale N/A N/A Granulation Quality: None Present (0%) N/A N/A Necrotic A mount: Fat Layer (Subcutaneous Tissue): Yes N/A N/A Exposed Structures: Fascia: No Tendon: No Muscle: No Joint: No Bone: No None N/A N/A Epithelialization: Negative Pressure Wound Therapy N/A N/A Procedures Performed: Application (NPWT) Treatment Notes Wound #  2 (Sacrum) Cleanser Soap and Water Discharge Instruction: May shower and wash wound with dial antibacterial soap and water prior to dressing change. Wound Cleanser Discharge Instruction: Cleanse the wound with wound cleanser prior to applying a clean dressing using gauze sponges, not tissue or cotton balls. Peri-Wound Care Skin Prep Discharge Instruction: Use skin prep as directed Topical Primary Dressing Dakin's Solution 0.25%, 16 (oz) Discharge Instruction: Moisten gauze with Dakin's solution and pack lightly into wound in clinic Secondary Dressing ALLEVYN Gentle Border, 4x4 (in/in) Discharge Instruction: Apply over primary dressing as directed. Secured With Compression Wrap Compression Stockings Environmental education officer) Signed: 05/24/2022 3:01:51 PM By: Fredirick Maudlin MD FACS Entered By: Fredirick Maudlin on 05/24/2022 15:01:51 -------------------------------------------------------------------------------- Multi-Disciplinary Care Plan Details Patient Name: Date of Service: [name] [name] 05/24/2022 2:00 PM Medical Record Number: [id number] Patient Account Number: [id number] Date of Birth/Sex: Treating RN: 03-May-1951 (71 y.o. F) Primary Care Amana Bouska: Dimas Chyle Other Clinician: Referring Cisco Kindt: Treating  Malai Lady/Extender: Randalyn Rhea in Treatment: 10 Multidisciplinary Care Plan reviewed with physician Active Inactive Pressure Nursing Diagnoses: Knowledge deficit related to causes and risk factors for pressure ulcer development Knowledge deficit related to management of pressures ulcers Potential for impaired tissue integrity related to pressure, friction, moisture, and shear Goals: Patient/caregiver will verbalize risk factors for pressure ulcer development Date Initiated: 03/12/2022 Date Inactivated: 04/11/2022 Target Resolution Date: 04/12/2022 Goal Status: Met Patient/caregiver will verbalize understanding of pressure ulcer management Date Initiated: 03/12/2022 Target Resolution Date: 06/21/2022 Goal Status: Active Interventions: Assess: immobility, friction, shearing, incontinence upon admission and as needed Assess offloading mechanisms upon admission and as needed Assess potential for pressure ulcer upon admission and as needed Provide education on pressure ulcers Notes: Electronic Signature(s) Signed: 05/24/2022 5:22:04 PM By: Blanche East RN Entered By: Blanche East on 05/24/2022 14:41:00 -------------------------------------------------------------------------------- Negative Pressure Wound Therapy Application (NPWT) Details Patient Name: Date of Service: [name] [name], [name] [name] 05/24/2022 2:00 PM Medical Record Number: [id number] Patient Account Number: [id number] Date of Birth/Sex: Treating RN: 09-27-1951 (71 y.o. F) Primary Care Noelly Lasseigne: Dimas Chyle Other Clinician: Referring Kaydin Labo: Treating Abdulhamid Olgin/Extender: Randalyn Rhea in Treatment: 10 NPWT Application Performed for: Wound #2 Sacrum Type: VAC System Coverage Size (sq cm): 0.81 Pressure Type: Constant Pressure Setting: 125 mmHG Drain Type: None Primary Contact: Non-Adherent Quantity of Sponges/Gauze Inserted: 1 Sponge/Dressing Type: Foam, Black Date  Initiated: 04/26/2022 Response to Treatment: tolerated well Post Procedure Diagnosis Same as Pre-procedure Electronic Signature(s) Signed: 05/24/2022 5:22:04 PM By: Blanche East RN Entered By: Blanche East on 05/24/2022 14:47:14 -------------------------------------------------------------------------------- Pain Assessment Details Patient Name: Date of Service: [name] [name] 05/24/2022 2:00 PM Medical Record Number: [id number] Patient Account Number: [id number] Date of Birth/Sex: Treating RN: 25-Sep-1951 (71 y.o. F) Primary Care Emon Miggins: Dimas Chyle Other Clinician: Referring Joffre Lucks: Treating Sunset Joshi/Extender: Randalyn Rhea in Treatment: 10 Active Problems Location of Pain Severity and Description of Pain Patient Has Paino No Site Locations Site Locations Rate the pain. Current Pain Level: 0 Pain Management and Medication Current Pain Management: Medication: No Cold Application: No Rest: No Massage: No Activity: No T.E.N.S.: No Heat Application: No Leg drop or elevation: No Is the Current Pain Management Adequate: Adequate How does your wound impact your activities of daily livingo Sleep: No Bathing: No Appetite: No Relationship With Others: No Bladder Continence: No Emotions: No Bowel Continence: No Work: No Toileting: No Drive: No Dressing: No Hobbies: No Electronic Signature(s) Signed: 05/24/2022 5:22:04 PM By: Blanche East RN Entered By: Blanche East on 05/24/2022 14:35:38 --------------------------------------------------------------------------------  Patient/Caregiver Education Details Patient Name: Date of Service: [name] [name], [name] [name] 7/28/2023andnbsp2:00 PM Medical Record Number: [id number] Patient Account Number: [id number] Date of Birth/Gender: Treating RN: 1950/12/24 (71 y.o. F) Primary Care Physician: Dimas Chyle Other Clinician: Referring Physician: Treating Physician/Extender: Randalyn Rhea in Treatment: 10 Education Assessment Education Provided To: Patient Education Topics Provided Pressure: Methods: Explain/Verbal Responses: Reinforcements needed, State content correctly Electronic Signature(s) Signed: 05/24/2022 5:22:04 PM By: Blanche East RN Entered By: Blanche East on 05/24/2022 14:41:17 -------------------------------------------------------------------------------- Wound Assessment Details Patient Name: Date of Service: [name] [name], [name] [name] 05/24/2022 2:00 PM Medical Record Number: [id number] Patient Account Number: [id number] Date of Birth/Sex: Treating RN: [birth year] (71 y.o. F) Primary Care Maham Quintin: Dimas Chyle Other Clinician: Referring Boomer Winders: Treating Julieanna Geraci/Extender: Randalyn Rhea in Treatment: 10 Wound Status Wound Number: 2 Primary Etiology: Pressure Ulcer Wound Location: Sacrum Wound Status: Open Wounding Event: Pressure Injury Comorbid History: Cataracts, Osteoarthritis Date Acquired: 02/17/2022 Weeks Of Treatment: 10 Clustered Wound: No Photos Wound Measurements Length: (cm) 0.9 Width: (cm) 0.9 Depth: (cm) 1 Area: (cm) 0.636 Volume: (cm) 0.636 % Reduction in Area: 90.4% % Reduction in Volume: 97% Epithelialization: None Tunneling: No Undermining: Yes Starting Position (o'clock): 9 Ending Position (o'clock): 12 Maximum Distance: (cm) 2 Wound Description Classification: Category/Stage III Wound Margin: Well defined, not attached Exudate Amount: Medium Exudate Type: Serosanguineous Exudate Color: red, brown Foul Odor After Cleansing: No Slough/Fibrino No Wound Bed Granulation Amount: Large (67-100%) Exposed Structure Granulation Quality: Red, Pale Fascia Exposed: No Necrotic Amount: None Present (0%) Fat Layer (Subcutaneous Tissue) Exposed: Yes Tendon Exposed: No Muscle Exposed: No Joint Exposed: No Bone Exposed: No Treatment Notes Wound #2 (Sacrum) Cleanser Soap and  Water Discharge Instruction: May shower and wash wound with dial antibacterial soap and water prior to dressing change. Wound Cleanser Discharge Instruction: Cleanse the wound with wound cleanser prior to applying a clean dressing using gauze sponges, not tissue or cotton balls. Peri-Wound Care Skin Prep Discharge Instruction: Use skin prep as directed Topical Primary Dressing Dakin's Solution 0.25%, 16 (oz) Discharge Instruction: Moisten gauze with Dakin's solution and pack lightly into wound in clinic Secondary Dressing ALLEVYN Gentle Border, 4x4 (in/in) Discharge Instruction: Apply over primary dressing as directed. Secured With Compression Wrap Compression Stockings Environmental education officer) Signed: 05/24/2022 5:18:09 PM By: Deon Pilling RN, BSN Entered By: Deon Pilling on 05/24/2022 14:38:58 -------------------------------------------------------------------------------- Vitals Details Patient Name: Date of Service: [name] [name] 05/24/2022 2:00 PM Medical Record Number: [id number] Patient Account Number: [id number] Date of Birth/Sex: Treating RN: 1951-06-07 (71 y.o. F) Primary Care Prentiss Hammett: Dimas Chyle Other Clinician: Referring Dyann Goodspeed: Treating Arie Powell/Extender: Randalyn Rhea in Treatment: 10 Vital Signs Time Taken: 14:31 Temperature (F): 97.6 Height (in): 67 Pulse (bpm): 84 Source: Stated Respiratory Rate (breaths/min): 18 Weight (lbs): 106 Blood Pressure (mmHg): 124/79 Source: Stated Reference Range: 80 - 120 mg / dl Body Mass Index (BMI): 16.6 Electronic Signature(s) Signed: 05/24/2022 5:22:04 PM By: Blanche East RN Entered By: Blanche East on 05/24/2022 14:32:01

## 2022-05-24 NOTE — Progress Notes (Signed)
[name] [name], [name] [name] ([id number]) Visit Report for 05/24/2022 Chief Complaint Document Details Patient Name: Date of Service: SYDNA, BRODOWSKI 05/24/2022 2:00 PM Medical Record Number: [id number] Patient Account Number: [id number] Date of Birth/Sex: Treating RN: [birth year] (71 y.o. F) Primary Care Provider: Dimas Chyle Other Clinician: Referring Provider: Treating Provider/Extender: Randalyn Rhea in Treatment: 10 Information Obtained from: Patient Chief Complaint The patient presents to the wound care center today for evaluation of a stage III sacral pressure ulcer as well as a dehisced surgical wound from an emergent femoral hernia repair. Electronic Signature(s) Signed: 05/24/2022 3:01:59 PM By: Fredirick Maudlin MD FACS Entered By: Fredirick Maudlin on 05/24/2022 15:01:59 -------------------------------------------------------------------------------- HPI Details Patient Name: Date of Service: [name] [name] 05/24/2022 2:00 PM Medical Record Number: [id number] Patient Account Number: [id number] Date of Birth/Sex: Treating RN: July 25, 1951 (71 y.o. F) Primary Care Provider: Dimas Chyle Other Clinician: Referring Provider: Treating Provider/Extender: Randalyn Rhea in Treatment: 10 History of Present Illness HPI Description: ADMISSION 03/12/2022 This is a 71 year old woman who underwent an emergency femoral hernia repair secondary to strangulated small bowel. She required a bowel resection and reanastomosis. While she was in the hospital, the wound dehisced and she was initially felt to have an enterocutaneous fistula. They were managing the effluent with an ostomy bag. She saw her general surgeon on May 11. At that time, there was no evidence of EC fistula and the wound was closing in nicely. This is currently being managed with silver alginate. Also during her hospitalization, she unfortunately developed a pressure ulcer. This is currently  stage III. She is in a skilled nursing facility at the moment but will be discharged on Sunday. This wound is being managed with Dakin's wet to dry dressing changes. She is not a diabetic, nor does she smoke. Her only real medical history is Parkinson's disease and fairly significant protein-calorie malnutrition. 04/04/2022: The inguinal wound is closed. The sacral wound is a little bit smaller today. It is clean without significant slough or odor. It continues to undermine for about 4 to 5 cm in the cranial direction. 04/11/2022: The orifice of her sacral wound continues to contract. She still has extensive undermining present. The wound is clean without slough or odor. She did not receive a wound VAC yet because her insurance has a contract with a different provider than the one we usually use. 04/18/2022: The wound is contracting but still has a deep area that is undermined. Apparently the cost for the wound VAC that she would be responsible for is prohibitive. She currently has Medicaid pending. The wound surface is pink and healthy-looking. There is just a little bit of slough and biofilm on the surface. 04/25/2022: The patient and her family decided they wanted to go ahead with wound VAC application. It has been delivered to their home, but they did not bring it with them because they understood that home health would initiate the therapy. Today, the wound surface looks clean and viable with good granulation tissue, but there is a musty odor and blue-green drainage on her dressing, consistent with Pseudomonas aeruginosa. 05/02/2022: For unclear reasons, the patient was told that the levofloxacin that I prescribed was a sulfa derived antibiotic and she did not take it. It does not make sense and I am not sure how that happened, but I reiterated to the patient that it is not a sulfa derived antibiotic and she can go ahead and take the medication. The wound VAC was initiated by home health  on Monday.  Unfortunately, it appears that they are not placing the foam all the way up into the undermined portion of the wound, as there is just a small button of foam identified when the wound VAC was removed today. There is some slough accumulation, as one might expect, within the wound bed. The periwound skin is intact. 05/17/2022: The wound VAC malfunctioned and was removed yesterday. On inspection, there was still a piece of sponge left in situ. It was removed and was a tiny piece of white sponge, clearly inadequate to fill the extensive undermining at the cranial portion of the wound. There has been no improvement in the undermining. She has hypertrophic granulation tissue at the orifice of the wound. Periwound skin is in good condition. 05/24/2022: The undermining has improved quite a bit since switching to just black foam; it only extends from 9-12 o'clock instead of circumferentially. The underlying wound surface has healthy-looking granulation tissue. No malodor or drainage. Electronic Signature(s) Signed: 05/24/2022 3:02:58 PM By: Fredirick Maudlin MD FACS Entered By: Fredirick Maudlin on 05/24/2022 15:02:58 -------------------------------------------------------------------------------- Physical Exam Details Patient Name: Date of Service: [name] [name] 05/24/2022 2:00 PM Medical Record Number: [id number] Patient Account Number: [id number] Date of Birth/Sex: Treating RN: 12/30/1950 (71 y.o. F) Primary Care Provider: Dimas Chyle Other Clinician: Referring Provider: Treating Provider/Extender: Randalyn Rhea in Treatment: 10 Constitutional . . . . No acute distress.Marland Kitchen Respiratory Normal work of breathing on room air.. Notes 05/24/2022: The undermining has improved quite a bit since switching to just black foam; it only extends from 9-12 o'clock instead of circumferentially. The underlying wound surface has healthy-looking granulation tissue. No malodor or  drainage. Electronic Signature(s) Signed: 05/24/2022 3:08:45 PM By: Fredirick Maudlin MD FACS Entered By: Fredirick Maudlin on 05/24/2022 15:08:45 -------------------------------------------------------------------------------- Physician Orders Details Patient Name: Date of Service: [name] [name] 05/24/2022 2:00 PM Medical Record Number: [id number] Patient Account Number: [id number] Date of Birth/Sex: Treating RN: 07-01-1951 (71 y.o. F) Primary Care Provider: Dimas Chyle Other Clinician: Referring Provider: Treating Provider/Extender: Randalyn Rhea in Treatment: 10 Verbal / Phone Orders: No Diagnosis Coding ICD-10 Coding Code Description L89.153 Pressure ulcer of sacral region, stage 3 G20 Parkinson's disease Follow-up Appointments ppointment in 2 weeks. - Dr. Celine Ahr - Room 1- Return A Friday 8/11 @ 1:15pm Other: - Try and increase protein intake-Premier Protein Shakes are 30g Protein,goal is 100g a day Bathing/ Shower/ Hygiene May shower with protection but do not get wound dressing(s) wet. Negative Presssure Wound Therapy Wound #2 Sacrum Wound Vac to wound continuously at 174m/hg pressure - [facility] be [facility] by home health Mon, Wed, Fri Black Foam - use only black foam, be sure to get black spone into undermining at 12 o'clock Off-Loading Low air-loss mattress (Group 2) Turn and reposition every 2 [place] [facility] [place]; continue Home Health for wound care. May utilize formulary equivalent dressing for wound treatment orders unless otherwise specified. - use one piece of black foam to fill to undermined area. no white foam. May hold VAC until Monday 7/24 Dressing changes to be completed by [facility] Monday / Wednesday / Friday [facility] Other Home Health Orders/Instructions: - [facility] Wound Treatment Wound #2 -  Sacrum Cleanser: Soap and Water 1 x Per Day/Other:while VAC is not in use Discharge Instructions: May shower and wash wound with dial antibacterial soap and water  prior to dressing change. Cleanser: Wound Cleanser 1 x Per Day/Other:while VAC is not in use Discharge Instructions: Cleanse the wound with wound cleanser prior to applying a clean dressing using gauze sponges, not tissue or cotton balls. Peri-Wound Care: Skin Prep 1 x Per Day/Other:while VAC is not in use Discharge Instructions: Use skin prep as directed Prim Dressing: Dakin's Solution 0.25%, 16 (oz) 1 x Per Day/Other:while VAC is not in use ary Discharge Instructions: Moisten gauze with Dakin's solution and pack lightly into wound in clinic Secondary Dressing: ALLEVYN Gentle Border, 4x4 (in/in) 1 x Per Day/Other:while VAC is not in use Discharge Instructions: Apply over primary dressing as directed. Electronic Signature(s) Signed: 05/24/2022 4:01:31 PM By: Fredirick Maudlin MD FACS Entered By: Fredirick Maudlin on 05/24/2022 15:11:15 -------------------------------------------------------------------------------- Problem List Details Patient Name: Date of Service: [name] [name] 05/24/2022 2:00 PM Medical Record Number: [id number] Patient Account Number: [id number] Date of Birth/Sex: Treating RN: 07-Jul-1951 (71 y.o. F) Primary Care Provider: Dimas Chyle Other Clinician: Referring Provider: Treating Provider/Extender: Randalyn Rhea in Treatment: 10 Active Problems ICD-10 Encounter Code Description Active Date MDM Diagnosis L89.153 Pressure ulcer of sacral region, stage 3 03/12/2022 No Yes G20 Parkinson's disease 03/12/2022 No Yes Inactive Problems ICD-10 Code Description Active Date Inactive Date T81.31XA Disruption of external operation (surgical) wound, not elsewhere classified, initial 03/12/2022 03/12/2022 encounter Resolved Problems Electronic Signature(s) Signed: 05/24/2022 3:01:44 PM By:  Fredirick Maudlin MD FACS Entered By: Fredirick Maudlin on 05/24/2022 15:01:44 -------------------------------------------------------------------------------- Progress Note Details Patient Name: Date of Service: [name] [name] 05/24/2022 2:00 PM Medical Record Number: [id number] Patient Account Number: [id number] Date of Birth/Sex: Treating RN: [birth year] (71 y.o. F) Primary Care Provider: Dimas Chyle Other Clinician: Referring Provider: Treating Provider/Extender: Randalyn Rhea in Treatment: 10 Subjective Chief Complaint Information obtained from Patient The patient presents to the wound care center today for evaluation of a stage III sacral pressure ulcer as well as a dehisced surgical wound from an emergent femoral hernia repair. History of Present Illness (HPI) ADMISSION 03/12/2022 This is a 71 year old woman who underwent an emergency femoral hernia repair secondary to strangulated small bowel. She required a bowel resection and reanastomosis. While she was in the hospital, the wound dehisced and she was initially felt to have an enterocutaneous fistula. They were managing the effluent with an ostomy bag. She saw her general surgeon on May 11. At that time, there was no evidence of EC fistula and the wound was closing in nicely. This is currently being managed with silver alginate. Also during her hospitalization, she unfortunately developed a pressure ulcer. This is currently stage III. She is in a skilled nursing facility at the moment but will be discharged on Sunday. This wound is being managed with Dakin's wet to dry dressing changes. She is not a diabetic, nor does she smoke. Her only real medical history is Parkinson's disease and fairly significant protein-calorie malnutrition. 04/04/2022: The inguinal wound is closed. The sacral wound is a little bit smaller today. It is clean without significant slough or odor. It continues to undermine for about 4 to  5 cm in the cranial direction. 04/11/2022: The orifice of her sacral wound continues to contract. She still has extensive undermining present. The wound is clean without slough or odor. She did not receive a wound VAC yet because her insurance has a contract with a different provider than the one we usually use. 04/18/2022: The wound is contracting but still has a deep area that is undermined. Apparently  the cost for the wound VAC that she would be responsible for is prohibitive. She currently has Medicaid pending. The wound surface is pink and healthy-looking. There is just a little bit of slough and biofilm on the surface. 04/25/2022: The patient and her family decided they wanted to go ahead with wound VAC application. It has been delivered to their home, but they did not bring it with them because they understood that home health would initiate the therapy. Today, the wound surface looks clean and viable with good granulation tissue, but there is a musty odor and blue-green drainage on her dressing, consistent with Pseudomonas aeruginosa. 05/02/2022: For unclear reasons, the patient was told that the levofloxacin that I prescribed was a sulfa derived antibiotic and she did not take it. It does not make sense and I am not sure how that happened, but I reiterated to the patient that it is not a sulfa derived antibiotic and she can go ahead and take the medication. The wound VAC was initiated by home health on Monday. Unfortunately, it appears that they are not placing the foam all the way up into the undermined portion of the wound, as there is just a small button of foam identified when the wound VAC was removed today. There is some slough accumulation, as one might expect, within the wound bed. The periwound skin is intact. 05/17/2022: The wound VAC malfunctioned and was removed yesterday. On inspection, there was still a piece of sponge left in situ. It was removed and was a tiny piece of white sponge,  clearly inadequate to fill the extensive undermining at the cranial portion of the wound. There has been no improvement in the undermining. She has hypertrophic granulation tissue at the orifice of the wound. Periwound skin is in good condition. 05/24/2022: The undermining has improved quite a bit since switching to just black foam; it only extends from 9-12 o'clock instead of circumferentially. The underlying wound surface has healthy-looking granulation tissue. No malodor or drainage. Patient History Information obtained from Patient, Caregiver. Family History Cancer - Mother,Father,Siblings, Hypertension - Siblings,Child. Social History Never smoker, Marital Status - Divorced, Alcohol Use - Never, Drug Use - No History, Caffeine Use - Moderate - Coffee, soda. Medical History Eyes Patient has history of Cataracts Musculoskeletal Patient has history of Osteoarthritis Medical A Surgical History Notes nd Gastrointestinal GERD Musculoskeletal degenerative disc disease Objective Constitutional No acute distress.. Vitals Time Taken: 2:31 PM, Height: 67 in, Source: Stated, Weight: 106 lbs, Source: Stated, BMI: 16.6, Temperature: 97.6 F, Pulse: 84 bpm, Respiratory Rate: 18 breaths/min, Blood Pressure: 124/79 mmHg. Respiratory Normal work of breathing on room air.. General Notes: 05/24/2022: The undermining has improved quite a bit since switching to just black foam; it only extends from 9-12 o'clock instead of circumferentially. The underlying wound surface has healthy-looking granulation tissue. No malodor or drainage. Integumentary (Hair, Skin) Wound #2 status is Open. Original cause of wound was Pressure Injury. The date acquired was: 02/17/2022. The wound has been in treatment 10 weeks. The wound is located on the Sacrum. The wound measures 0.9cm length x 0.9cm width x 1cm depth; 0.636cm^2 area and 0.636cm^3 volume. There is Fat Layer (Subcutaneous Tissue) exposed. There is no tunneling  noted, however, there is undermining starting at 9:00 and ending at 12:00 with a maximum distance of 2cm. There is a medium amount of serosanguineous drainage noted. The wound margin is well defined and not attached to the wound base. There is large (67- 100%) red, pale granulation within  the wound bed. There is no necrotic tissue within the wound bed. Assessment Active Problems ICD-10 Pressure ulcer of sacral region, stage 3 Parkinson's disease Plan Follow-up Appointments: Return Appointment in 2 weeks. - Dr. Celine Ahr - Room 1- Friday 8/11 @ 1:15pm Other: - Try and increase protein intake-Premier Protein Shakes are 30g Protein,goal is 100g a day Bathing/ Shower/ Hygiene: May shower with protection but do not get wound dressing(s) wet. Negative Presssure Wound Therapy: Wound #2 Sacrum: Wound Vac to wound continuously at 128m/hg pressure - [facility] be changed by home health Mon, Wed, Fri Black Foam - use only black foam, be sure to get black spone into undermining at 12 o'clock Off-Loading: Low air-loss mattress (Group 2) Turn and reposition every 2 hours Home Health: New wound care orders this week; continue Home Health for wound care. May utilize formulary equivalent dressing for wound treatment orders unless otherwise specified. - use one piece of black foam to fill to undermined area. no white foam. May hold VAC until Monday 7/24 Dressing [place] to [place] [place] / Wednesday / Friday [facility] Orders/Instructions: - [facility] WOUND #2: - Sacrum Wound Laterality: Cleanser: Soap and Water 1 x Per Day/Other:while VAC is not in use Discharge Instructions: May shower and wash wound with dial antibacterial soap and water prior to dressing change. Cleanser: Wound Cleanser 1 x Per Day/Other:while VAC is not in use Discharge Instructions: Cleanse the  wound with wound cleanser prior to applying a clean dressing using gauze sponges, not tissue or cotton balls. Peri-Wound Care: Skin Prep 1 x Per Day/Other:while VAC is not in use Discharge Instructions: Use skin prep as directed Prim Dressing: Dakin's Solution 0.25%, 16 (oz) 1 x Per Day/Other:while VAC is not in use ary Discharge Instructions: Moisten gauze with Dakin's solution and pack lightly into wound in clinic Secondary Dressing: ALLEVYN Gentle Border, 4x4 (in/in) 1 x Per Day/Other:while VAC is not in use Discharge Instructions: Apply over primary dressing as directed. 05/24/2022: The undermining has improved quite a bit since switching to just black foam; it only extends from 9-12 o'clock instead of circumferentially. The underlying wound surface has healthy-looking granulation tissue. No malodor or drainage. No debridement was necessary today. We will continue using the wound VAC with black sponge only. Continue efforts to offload the site and augment oral protein intake. She will follow-up in 2 weeks. Electronic Signature(s) Signed: 05/24/2022 3:11:51 PM By: CFredirick MaudlinMD FACS Entered By: CFredirick Maudlinon 05/24/2022 15:11:51 -------------------------------------------------------------------------------- HxROS Details Patient Name: Date of Service: GBeverlee Nims7/28/2023 2:00 PM Medical Record Number: [id number] Account Number: [id number] of Birth/Sex: Treating RN: 4December 13, 1952(71y.o. F) Primary Care Provider: PDimas ChyleOther Clinician: Referring Provider: Treating Provider/Extender: CRandalyn Rheain Treatment: 10 Information Obtained From Patient Caregiver Eyes Medical History: Positive for: Cataracts Gastrointestinal Medical History: Past Medical History Notes: GERD Musculoskeletal Medical History: Positive for: Osteoarthritis Past Medical History Notes: degenerative disc disease HBO Extended History  Items Eyes: Cataracts Immunizations Pneumococcal Vaccine: Received Pneumococcal Vaccination: Yes Received Pneumococcal Vaccination On or After 60th Birthday: No Implantable Devices None Family and Social History Cancer: Yes - Mother,Father,Siblings; Hypertension: Yes - Siblings,Child; Never smoker; Marital Status - Divorced; Alcohol Use: Never; Drug Use: No History; Caffeine Use: Moderate - Coffee, soda; Financial Concerns: No; Food, Clothing or Shelter Needs: No; Support System Lacking: No; Transportation Concerns:  No Electronic Signature(s) Signed: 05/24/2022 4:01:31 PM By: Fredirick Maudlin MD FACS Entered By: Fredirick Maudlin on 05/24/2022 15:08:24 -------------------------------------------------------------------------------- SuperBill Details Patient Name: Date of Service: [name] [name] 05/24/2022 Medical Record Number: [id number] Patient Account Number: [id number] Date of Birth/Sex: Treating RN: Nov 18, 1950 (71 y.o. F) Primary Care Provider: Dimas Chyle Other Clinician: Referring Provider: Treating Provider/Extender: Randalyn Rhea in Treatment: 10 Diagnosis Coding ICD-10 Codes Code Description [phone] Pressure ulcer of sacral region, stage 3 G20 Parkinson's disease Facility Procedures CPT4 Code: [id number] Description: 17356 - WOUND VAC-50 SQ CM OR LESS Modifier: Quantity: 1 Physician Procedures : CPT4 Code Description Modifier [id number] - WC PHYS LEVEL 4 - EST PT ICD-10 Diagnosis Description L89.153 Pressure ulcer of sacral region, stage 3 G20 Parkinson's disease Quantity: 1 Electronic Signature(s) Signed: 05/24/2022 3:12:09 PM By: Fredirick Maudlin MD FACS Entered By: Fredirick Maudlin on 05/24/2022 15:12:08

## 2022-05-27 NOTE — Telephone Encounter (Signed)
Form Faxed to [phone] 

## 2022-06-07 ENCOUNTER — Encounter (HOSPITAL_BASED_OUTPATIENT_CLINIC_OR_DEPARTMENT_OTHER): Payer: Medicare HMO | Attending: General Surgery | Admitting: General Surgery

## 2022-06-07 DIAGNOSIS — G20 Parkinson's disease: Secondary | ICD-10-CM | POA: Diagnosis not present

## 2022-06-07 DIAGNOSIS — L89153 Pressure ulcer of sacral region, stage 3: Secondary | ICD-10-CM | POA: Insufficient documentation

## 2022-06-14 ENCOUNTER — Encounter (HOSPITAL_BASED_OUTPATIENT_CLINIC_OR_DEPARTMENT_OTHER): Payer: Medicare HMO | Admitting: General Surgery

## 2022-06-14 ENCOUNTER — Telehealth: Payer: Self-pay | Admitting: Family Medicine

## 2022-06-14 NOTE — Progress Notes (Signed)
AVYNN, KLASSEN ([id number]) Visit Report for 06/07/2022 Chief Complaint Document Details Patient Name: Date of Service: [name], [name] 06/07/2022 1:15 PM Medical Record Number: [id number] Patient Account Number: [id number] Date of Birth/Sex: Treating RN: Aug 08, 1951 (71 y.o. [name] Primary Care Provider: Dimas Chyle Other Clinician: Referring Provider: Treating Provider/Extender: Randalyn Rhea in Treatment: 12 Information Obtained from: Patient Chief Complaint The patient presents to the wound care center today for evaluation of a stage III sacral pressure ulcer as well as a dehisced surgical wound from an emergent femoral hernia repair. Electronic Signature(s) Signed: 06/07/2022 1:56:23 PM By: Fredirick Maudlin MD FACS Entered By: Fredirick Maudlin on 06/07/2022 13:56:22 -------------------------------------------------------------------------------- HPI Details Patient Name: Date of Service: [name] [name] 06/07/2022 1:15 PM Medical Record Number: [id number] Patient Account Number: [id number] Date of Birth/Sex: Treating RN: 1951/01/16 (71 y.o. [name] Primary Care Provider: Dimas Chyle Other Clinician: Referring Provider: Treating Provider/Extender: Randalyn Rhea in Treatment: 12 History of Present Illness HPI Description: ADMISSION 03/12/2022 This is a 71 year old woman who underwent an emergency femoral hernia repair secondary to strangulated small bowel. She required a bowel resection and reanastomosis. While she was in the hospital, the wound dehisced and she was initially felt to have an enterocutaneous fistula. They were managing the effluent with an ostomy bag. She saw her general surgeon on May 11. At that time, there was no evidence of EC fistula and the wound was closing in nicely. This is currently being managed with silver alginate. Also during her hospitalization, she unfortunately developed a  pressure ulcer. This is currently stage III. She is in a skilled nursing facility at the moment but will be discharged on Sunday. This wound is being managed with Dakin's wet to dry dressing changes. She is not a diabetic, nor does she smoke. Her only real medical history is Parkinson's disease and fairly significant protein-calorie malnutrition. 04/04/2022: The inguinal wound is closed. The sacral wound is a little bit smaller today. It is clean without significant slough or odor. It continues to undermine for about 4 to 5 cm in the cranial direction. 04/11/2022: The orifice of her sacral wound continues to contract. She still has extensive undermining present. The wound is clean without slough or odor. She did not receive a wound VAC yet because her insurance has a contract with a different provider than the one we usually use. 04/18/2022: The wound is contracting but still has a deep area that is undermined. Apparently the cost for the wound VAC that she would be responsible for is prohibitive. She currently has Medicaid pending. The wound surface is pink and healthy-looking. There is just a little bit of slough and biofilm on the surface. 04/25/2022: The patient and her family decided they wanted to go ahead with wound VAC application. It has been delivered to their home, but they did not bring it with them because they understood that home health would initiate the therapy. Today, the wound surface looks clean and viable with good granulation tissue, but there is a musty odor and blue-green drainage on her dressing, consistent with Pseudomonas aeruginosa. 05/02/2022: For unclear reasons, the patient was told that the levofloxacin that I prescribed was a sulfa derived antibiotic and she did not take it. It does not make sense and I am not sure how that happened, but I reiterated to the patient that it is not a sulfa derived antibiotic and she can go ahead and take the medication. The wound VAC was  initiated by home health on Monday. Unfortunately, it appears that they are not placing the foam all the way up into the undermined portion of the wound, as there is just a small button of foam identified when the wound VAC was removed today. There is some slough accumulation, as one might expect, within the wound bed. The periwound skin is intact. 05/17/2022: The wound VAC malfunctioned and was removed yesterday. On inspection, there was still a piece of sponge left in situ. It was removed and was a tiny piece of white sponge, clearly inadequate to fill the extensive undermining at the cranial portion of the wound. There has been no improvement in the undermining. She has hypertrophic granulation tissue at the orifice of the wound. Periwound skin is in good condition. 05/24/2022: The undermining has improved quite a bit since switching to just black foam; it only extends from 9-12 o'clock instead of circumferentially. The underlying wound surface has healthy-looking granulation tissue. No malodor or drainage. 06/07/2022: The wound orifice is quite small and the tunneling has come in substantially. No concern for infection. Electronic Signature(s) Signed: 06/07/2022 1:56:57 PM By: Fredirick Maudlin MD FACS Entered By: Fredirick Maudlin on 06/07/2022 13:56:56 -------------------------------------------------------------------------------- Physical Exam Details Patient Name: Date of Service: [name] [name], [name] [name] 06/07/2022 1:15 PM Medical Record Number: [id number] Patient Account Number: [id number] Date of Birth/Sex: Treating RN: [birth year] (71 y.o. [name] Primary Care Provider: Dimas Chyle Other Clinician: Referring Provider: Treating Provider/Extender: Randalyn Rhea in Treatment: 12 Constitutional . . . . No acute distress.Marland Kitchen Respiratory Normal work of breathing on room air.. Notes 06/07/2022: The wound orifice is quite small and the tunneling has come in  substantially. No concern for infection. Electronic Signature(s) Signed: 06/07/2022 1:57:57 PM By: Fredirick Maudlin MD FACS Entered By: Fredirick Maudlin on 06/07/2022 13:57:57 -------------------------------------------------------------------------------- Physician Orders Details Patient Name: Date of Service: [name] [name], [name] [name] 06/07/2022 1:15 PM Medical Record Number: [id number] Patient Account Number: [id number] Date of Birth/Sex: Treating RN: 08/09/1951 (71 y.o. Debby Bud Primary Care Provider: Dimas Chyle Other Clinician: Referring Provider: Treating Provider/Extender: Randalyn Rhea in Treatment: 12 Verbal / Phone Orders: No Diagnosis Coding ICD-10 Coding Code Description L89.153 Pressure ulcer of sacral region, stage 3 G20 Parkinson's disease Follow-up Appointments ppointment in 1 week. - Dr. Celine Ahr - Room 1- Return A Friday 8/18 1230 Other: - Try and increase protein intake-Premier Protein Shakes are 30g Protein,goal is 100g a day Bathing/ Shower/ Hygiene May shower with protection but do not get wound dressing(s) wet. Negative Presssure Wound Therapy Wound #2 Sacrum Wound Vac to wound continuously at 144m/hg pressure - HOLD THIS WEEK. WILL EVALUATE AT NEXT APPT TIME IF TO DISCONTINUE. Off-Loading Low air-loss mattress (Group 2) Turn and reposition every 2 hours Home Health New wound care orders this week; continue Home Health for wound care. May utilize formulary equivalent dressing for wound treatment orders unless otherwise specified. - HOLD VAC X1 WEEK. Will reevaluate next Friday at appt time. New dressing iodoform packing strips 1/4in. home health to change every other week. Dressing [facility] Wednesday / Friday [facility] - Sacrum Cleanser: Soap and Water Every Other  Day/30 Days Discharge Instructions: May shower and wash wound with dial antibacterial soap and water prior to dressing change. Cleanser: Wound Cleanser Every Other Day/30 Days Discharge Instructions: Cleanse the wound with wound  cleanser prior to applying a clean dressing using gauze sponges, not tissue or cotton balls. Peri-Wound Care: Skin Prep Every Other Day/30 Days Discharge Instructions: Use skin prep as directed Prim Dressing: Iodoform packing strip 1/4 (in) [facility]) Every Other Day/30 Days ary Discharge Instructions: Lightly pack into tunneling and undermining. Secondary Dressing: ALLEVYN Gentle Border, 4x4 (in/in) Every Other Day/30 Days Discharge Instructions: Apply over primary dressing as directed. Electronic Signature(s) Signed: 06/07/2022 3:37:27 PM By: Fredirick Maudlin MD FACS Entered By: Fredirick Maudlin on 06/07/2022 13:58:10 -------------------------------------------------------------------------------- Problem List Details Patient Name: Date of Service: [name] [name], [name] [name] 06/07/2022 1:15 PM Medical Record Number: [id number] Patient Account Number: [id number] Date of Birth/Sex: Treating RN: Aug 30, 1951 (71 y.o. Debby Bud Primary Care Provider: Dimas Chyle Other Clinician: Referring Provider: Treating Provider/Extender: Randalyn Rhea in Treatment: 12 Active Problems ICD-10 Encounter Code Description Active Date MDM Diagnosis L89.153 Pressure ulcer of sacral region, stage 3 03/12/2022 No Yes G20 Parkinson's disease 03/12/2022 No Yes Inactive Problems ICD-10 Code Description Active Date Inactive Date T81.31XA Disruption of external operation (surgical) wound, not elsewhere classified, initial 03/12/2022 03/12/2022 encounter Resolved Problems Electronic Signature(s) Signed: 06/07/2022 1:55:28 PM By: Fredirick Maudlin MD FACS Entered By: Fredirick Maudlin on 06/07/2022  13:55:28 -------------------------------------------------------------------------------- Progress Note Details Patient Name: Date of Service: [name] [name] 06/07/2022 1:15 PM Medical Record Number: [id number] Patient Account Number: [id number] Date of Birth/Sex: Treating RN: [birth year] (71 y.o. [name] Primary Care Provider: Dimas Chyle Other Clinician: Referring Provider: Treating Provider/Extender: Randalyn Rhea in Treatment: 12 Subjective Chief Complaint Information obtained from Patient The patient presents to the wound care center today for evaluation of a stage III sacral pressure ulcer as well as a dehisced surgical wound from an emergent femoral hernia repair. History of Present Illness (HPI) ADMISSION 03/12/2022 This is a 71 year old woman who underwent an emergency femoral hernia repair secondary to strangulated small bowel. She required a bowel resection and reanastomosis. While she was in the hospital, the wound dehisced and she was initially felt to have an enterocutaneous fistula. They were managing the effluent with an ostomy bag. She saw her general surgeon on May 11. At that time, there was no evidence of EC fistula and the wound was closing in nicely. This is currently being managed with silver alginate. Also during her hospitalization, she unfortunately developed a pressure ulcer. This is currently stage III. She is in a skilled nursing facility at the moment but will be discharged on Sunday. This wound is being managed with Dakin's wet to dry dressing changes. She is not a diabetic, nor does she smoke. Her only real medical history is Parkinson's disease and fairly significant protein-calorie malnutrition. 04/04/2022: The inguinal wound is closed. The sacral wound is a little bit smaller today. It is clean without significant slough or odor. It continues to undermine for about 4 to 5 cm in the cranial direction. 04/11/2022: The  orifice of her sacral wound continues to contract. She still has extensive undermining present. The wound is clean without slough or odor. She did not receive a wound VAC yet because her insurance has a contract with a different provider than the one we usually use. 04/18/2022: The wound is contracting but still has a deep area that is undermined. Apparently the cost for the wound VAC that she would be responsible for is prohibitive. She currently has Medicaid pending. The wound surface is pink and healthy-looking. There is just a little bit of slough and biofilm on the  surface. 04/25/2022: The patient and her family decided they wanted to go ahead with wound VAC application. It has been delivered to their home, but they did not bring it with them because they understood that home health would initiate the therapy. Today, the wound surface looks clean and viable with good granulation tissue, but there is a musty odor and blue-green drainage on her dressing, consistent with Pseudomonas aeruginosa. 05/02/2022: For unclear reasons, the patient was told that the levofloxacin that I prescribed was a sulfa derived antibiotic and she did not take it. It does not make sense and I am not sure how that happened, but I reiterated to the patient that it is not a sulfa derived antibiotic and she can go ahead and take the medication. The wound VAC was initiated by home health on Monday. Unfortunately, it appears that they are not placing the foam all the way up into the undermined portion of the wound, as there is just a small button of foam identified when the wound VAC was removed today. There is some slough accumulation, as one might expect, within the wound bed. The periwound skin is intact. 05/17/2022: The wound VAC malfunctioned and was removed yesterday. On inspection, there was still a piece of sponge left in situ. It was removed and was a tiny piece of white sponge, clearly inadequate to fill the extensive  undermining at the cranial portion of the wound. There has been no improvement in the undermining. She has hypertrophic granulation tissue at the orifice of the wound. Periwound skin is in good condition. 05/24/2022: The undermining has improved quite a bit since switching to just black foam; it only extends from 9-12 o'clock instead of circumferentially. The underlying wound surface has healthy-looking granulation tissue. No malodor or drainage. 06/07/2022: The wound orifice is quite small and the tunneling has come in substantially. No concern for infection. Patient History Information obtained from Patient, Caregiver. Family History Cancer - Mother,Father,Siblings, Hypertension - Siblings,Child. Social History Never smoker, Marital Status - Divorced, Alcohol Use - Never, Drug Use - No History, Caffeine Use - Moderate - Coffee, soda. Medical History Eyes Patient has history of Cataracts Musculoskeletal Patient has history of Osteoarthritis Medical A Surgical History Notes nd Gastrointestinal GERD Musculoskeletal degenerative disc disease Objective Constitutional No acute distress.. Vitals Time Taken: 1:09 PM, Height: 67 in, Weight: 106 lbs, BMI: 16.6, Temperature: 97.8 F, Pulse: 87 bpm, Respiratory Rate: 18 breaths/min, Blood Pressure: 119/72 mmHg. Respiratory Normal work of breathing on room air.. General Notes: 06/07/2022: The wound orifice is quite small and the tunneling has come in substantially. No concern for infection. Integumentary (Hair, Skin) Wound #2 status is Open. Original cause of wound was Pressure Injury. The date acquired was: 02/17/2022. The wound has been in treatment 12 weeks. The wound is located on the Sacrum. The wound measures 0.3cm length x 0.2cm width x 1cm depth; 0.047cm^2 area and 0.047cm^3 volume. There is Fat Layer (Subcutaneous Tissue) exposed. There is no tunneling noted, however, there is undermining starting at 10:00 and ending at 1:00 with a  maximum distance of 2cm. There is a medium amount of serosanguineous drainage noted. The wound margin is well defined and not attached to the wound base. There is large (67- 100%) red, pale granulation within the wound bed. There is no necrotic tissue within the wound bed. Assessment Active Problems ICD-10 Pressure ulcer of sacral region, stage 3 Parkinson's disease Plan Follow-up Appointments: Return Appointment in 1 week. - Dr. Celine Ahr - Room 1- Friday 8/18  1230 Other: - Try and increase protein intake-Premier Protein Shakes are 30g Protein,goal is 100g a day Bathing/ Shower/ Hygiene: May shower with protection but do not get wound dressing(s) wet. Negative Presssure Wound Therapy: Wound #2 Sacrum: Wound Vac to wound continuously at 171m/hg pressure - HOLD THIS WEEK. WILL EVALUATE AT NEXT APPT TIME IF TO DISCONTINUE. Off-Loading: Low air-loss mattress (Group 2) Turn and reposition every 2 hours Home Health: New wound care orders this week; continue Home Health for wound care. May utilize formulary equivalent dressing for wound treatment orders unless otherwise specified. - HOLD VAC X1 WEEK. Will reevaluate next Friday at appt time. New dressing iodoform packing strips 1/4in. home health to change every other week. Dressing changes to be [place] [facility] / Wednesday / Friday except [facility] Other Home Health Orders/Instructions: -[facility]: - Sacrum Wound Laterality: Cleanser: Soap and Water Every Other Day/30 Days Discharge Instructions: May shower and wash wound with dial antibacterial soap and water prior to dressing change. Cleanser: Wound Cleanser Every Other Day/30 Days Discharge Instructions: Cleanse the wound with wound cleanser prior to applying a clean dressing using gauze sponges, not tissue or cotton balls. Peri-Wound Care: Skin Prep Every Other Day/30 Days Discharge Instructions: Use skin prep as  directed Prim Dressing: Iodoform packing strip 1/4 (in) ([facility] Every Other Day/30 Days ary Discharge Instructions: Lightly pack into tunneling and undermining. Secondary Dressing: ALLEVYN Gentle Border, 4x4 (in/in) Every Other Day/30 Days Discharge Instructions: Apply over primary dressing as directed. 06/07/2022: The wound orifice is quite small and the tunneling has come in substantially. No concern for infection. No debridement was necessary today. I think at this point, application of a wound VAC is going to be technically too difficult because of the small orifice. We will instead, packed the wound with quarter inch iodoform packing strips. Follow-up in 1 week. Electronic Signature(s) Signed: 06/07/2022 1:58:41 PM By: CFredirick MaudlinMD FACS Entered By: CFredirick Maudlinon 06/07/2022 13:58:40 -------------------------------------------------------------------------------- HxROS Details Patient Name: Date of Service: GBeverlee Nims8/08/2022 1:15 PM Medical Record Number: [id number] Account Number: [id number] of Birth/Sex: Treating RN: [birth year](71y.o. FElam DutchPrimary Care Provider: PDimas ChyleOther Clinician: Referring Provider: Treating Provider/Extender: CRandalyn Rheain Treatment: 12 Information Obtained From Patient Caregiver Eyes Medical History: Positive for: Cataracts Gastrointestinal Medical History: Past Medical History Notes: GERD Musculoskeletal Medical History: Positive for: Osteoarthritis Past Medical History Notes: degenerative disc disease HBO Extended History Items Eyes: Cataracts Immunizations Pneumococcal Vaccine: Received Pneumococcal Vaccination: Yes Received Pneumococcal Vaccination On or After 60th Birthday: No Implantable Devices None Family and Social History Cancer: Yes - Mother,Father,Siblings; Hypertension: Yes - Siblings,Child; Never smoker; Marital Status - Divorced;  Alcohol Use: Never; Drug Use: No History; Caffeine Use: Moderate - Coffee, soda; Financial Concerns: No; Food, Clothing or Shelter Needs: No; Support System Lacking: No; Transportation Concerns: No Electronic Signature(s) Signed: 06/07/2022 3:37:27 PM By: CFredirick MaudlinMD FACS Signed: 06/10/2022 4:52:29 PM By: BBaruch GoutyRN, BSN Entered By: CFredirick Maudlinon 06/07/2022 13:57:03 -------------------------------------------------------------------------------- SuperBill Details Patient Name: Date of Service: GBeverlee Nims8/08/2022 Medical Record Number: [id number] Account Number: [id number] of Birth/Sex: Treating RN: [birth year](71y.o. FDebby BudPrimary Care Provider: PDimas ChyleOther Clinician: Referring Provider: Treating Provider/Extender: CRandalyn Rheain Treatment: 12 Diagnosis Coding ICD-10 Codes Code Description [phone] ulcer of sacral region, stage 3 G20 Parkinson's disease Facility  Procedures CPT4 Code: [id number] Description: 53912 - WOUND CARE VISIT-LEV 3 EST PT Modifier: Quantity: 1 Physician Procedures : CPT4 Code Description Modifier [id number] - WC PHYS LEVEL 3 - EST PT ICD-10 Diagnosis Description L89.153 Pressure ulcer of sacral region, stage 3 G20 Parkinson's disease Quantity: 1 Electronic Signature(s) Signed: 06/07/2022 1:58:52 PM By: Fredirick Maudlin MD FACS Entered By: Fredirick Maudlin on 06/07/2022 13:58:51

## 2022-06-14 NOTE — Progress Notes (Signed)
[name] [name] ([id number]) Visit Report for 06/07/2022 Arrival Information Details Patient Name: Date of Service: [name] [name], [name] [name] 06/07/2022 1:15 PM Medical Record Number: [id number] Patient Account Number: [id number] Date of Birth/Sex: Treating RN: 01-01-1951 (71 y.o. [name] Primary Care Abdulrahman Bracey: Dimas Chyle Other Clinician: Referring Shawntrice Salle: Treating Ranon Coven/Extender: Randalyn Rhea in Treatment: 12 Visit Information History Since Last Visit Added or deleted any medications: No Patient Arrived: [name] Any new allergies or adverse reactions: No Arrival Time: 13:08 Had a fall or experienced change in No Accompanied By: daughter activities of daily living that may affect Transfer Assistance: None risk of falls: Patient Identification Verified: Yes Signs or symptoms of abuse/neglect since last visito No Secondary Verification Process Completed: Yes Hospitalized since last visit: No Patient Requires Transmission-Based Precautions: No Implantable device outside of the clinic excluding No Patient Has Alerts: No cellular tissue based products placed in the center since last visit: Has Dressing in Place as Prescribed: Yes Pain Present Now: No Electronic Signature(s) Signed: 06/10/2022 4:32:05 PM By: Sandre Kitty Entered By: Sandre Kitty on 06/07/2022 13:09:16 -------------------------------------------------------------------------------- Clinic Level of Care Assessment Details Patient Name: Date of Service: [name] [name] 06/07/2022 1:15 PM Medical Record Number: [id number] Patient Account Number: [id number] Date of Birth/Sex: Treating RN: 1951/02/13 (71 y.o. Helene Shoe, Tammi Klippel Primary Care Curtistine Pettitt: Dimas Chyle Other Clinician: Referring Kaavya Puskarich: Treating Addysen Louth/Extender: Randalyn Rhea in Treatment: 12 Clinic Level of Care Assessment Items TOOL 4 Quantity Score X- 1 0 Use when only an EandM is  performed on FOLLOW-UP visit ASSESSMENTS - Nursing Assessment / Reassessment X- 1 10 Reassessment of Co-morbidities (includes updates in patient status) X- 1 5 Reassessment of Adherence to Treatment Plan ASSESSMENTS - Wound and Skin A ssessment / Reassessment X - Simple Wound Assessment / Reassessment - one wound 1 5 []  - 0 Complex Wound Assessment / Reassessment - multiple wounds X- 1 10 Dermatologic / Skin Assessment (not related to wound area) ASSESSMENTS - Focused Assessment []  - 0 Circumferential Edema Measurements - multi extremities X- 1 10 Nutritional Assessment / Counseling / Intervention []  - 0 Lower Extremity Assessment (monofilament, tuning fork, pulses) []  - 0 Peripheral Arterial Disease Assessment (using hand held doppler) ASSESSMENTS - Ostomy and/or Continence Assessment and Care []  - 0 Incontinence Assessment and Management []  - 0 Ostomy Care Assessment and Management (repouching, etc.) PROCESS - Coordination of Care X - Simple Patient / Family Education for ongoing care 1 15 []  - 0 Complex (extensive) Patient / Family Education for ongoing care X- 1 10 Staff obtains Consents, Records, T Results / Process Orders est X- 1 10 Staff telephones HHA, Nursing Homes / Clarify orders / etc []  - 0 Routine Transfer to another Facility (non-emergent condition) []  - 0 Routine Hospital Admission (non-emergent condition) []  - 0 New Admissions / Biomedical engineer / Ordering NPWT Apligraf, etc. , []  - 0 Emergency Hospital Admission (emergent condition) X- 1 10 Simple Discharge Coordination []  - 0 Complex (extensive) Discharge Coordination PROCESS - Special Needs []  - 0 Pediatric / Minor Patient Management []  - 0 Isolation Patient Management []  - 0 Hearing / Language / Visual special needs []  - 0 Assessment of Community assistance (transportation, D/C planning, etc.) []  - 0 Additional assistance / Altered mentation []  - 0 Support Surface(s) Assessment  (bed, cushion, seat, etc.) INTERVENTIONS - Wound Cleansing / Measurement X - Simple Wound Cleansing - one wound 1 5 []  - 0 Complex Wound Cleansing - multiple wounds X- 1 5 Wound  Imaging (photographs - any number of wounds) $RemoveBe'[]'XnXrvzhLM$  - 0 Wound Tracing (instead of photographs) X- 1 5 Simple Wound Measurement - one wound $RemoveB'[]'xWHutLOs$  - 0 Complex Wound Measurement - multiple wounds INTERVENTIONS - Wound Dressings X - Small Wound Dressing one or multiple wounds 1 10 $Re'[]'qki$  - 0 Medium Wound Dressing one or multiple wounds $RemoveBeforeD'[]'EsJHPLoVKfVxXA$  - 0 Large Wound Dressing one or multiple wounds $RemoveBeforeD'[]'quaDFKlszupYZQ$  - 0 Application of Medications - topical $RemoveB'[]'hlilvbck$  - 0 Application of Medications - injection INTERVENTIONS - Miscellaneous $RemoveBeforeD'[]'eNZHTaHVGjqcmB$  - 0 External ear exam $Remove'[]'GkJbXEV$  - 0 Specimen Collection (cultures, biopsies, blood, body fluids, etc.) $RemoveBefor'[]'oeUtaphxilun$  - 0 Specimen(s) / Culture(s) sent or taken to Lab for analysis $RemoveBefo'[]'RvjTMNjYbpa$  - 0 Patient Transfer (multiple staff / Civil Service fast streamer / Similar devices) $RemoveBeforeDE'[]'QhYeUZrLWqWzcwd$  - 0 Simple Staple / Suture removal (25 or less) $Remove'[]'woZGNLH$  - 0 Complex Staple / Suture removal (26 or more) $Remove'[]'CstJwAQ$  - 0 Hypo / Hyperglycemic Management (close monitor of Blood Glucose) $RemoveBefore'[]'CIcDJSJTWfsNt$  - 0 Ankle / Brachial Index (ABI) - do not check if billed separately X- 1 5 Vital Signs Has the patient been seen at the hospital within the last three years: Yes Total Score: 115 Level Of Care: New/Established - Level 3 Electronic Signature(s) Signed: 06/07/2022 5:06:10 PM By: Deon Pilling RN, BSN Entered By: Deon Pilling on 06/07/2022 13:37:43 -------------------------------------------------------------------------------- Encounter Discharge Information Details Patient Name: Date of Service: [name] [name] 06/07/2022 1:15 PM Medical Record Number: [id number] Patient Account Number: [id number] Date of Birth/Sex: Treating RN: 05-13-1951 (71 y.o. Debby Bud Primary Care Tynisa Vohs: Dimas Chyle Other Clinician: Referring Abriel Hattery: Treating Joshwa Hemric/Extender: Randalyn Rhea in Treatment: 12 Encounter Discharge Information Items Discharge Condition: Stable Ambulatory Status: Wheelchair Discharge Destination: Home Transportation: Private Auto Accompanied By: daughter Schedule Follow-up Appointment: Yes Clinical Summary of Care: Electronic Signature(s) Signed: 06/07/2022 5:06:10 PM By: Deon Pilling RN, BSN Entered By: Deon Pilling on 06/07/2022 13:38:11 -------------------------------------------------------------------------------- Lower Extremity Assessment Details Patient Name: Date of Service: ONEDIA, VARGUS 06/07/2022 1:15 PM Medical Record Number: [id number] Patient Account Number: [id number] Date of Birth/Sex: Treating RN: 1951-06-19 (71 y.o. Debby Bud Primary Care Murel Shenberger: Dimas Chyle Other Clinician: Referring Chiann Goffredo: Treating Lael Wetherbee/Extender: Randalyn Rhea in Treatment: 12 Electronic Signature(s) Signed: 06/07/2022 5:06:10 PM By: Deon Pilling RN, BSN Entered By: Deon Pilling on 06/07/2022 13:26:14 -------------------------------------------------------------------------------- Multi Wound Chart Details Patient Name: Date of Service: [name] [name] 06/07/2022 1:15 PM Medical Record Number: [id number] Patient Account Number: [id number] Date of Birth/Sex: Treating RN: 12/16/1950 (71 y.o. [name] Primary Care Markeshia Giebel: Dimas Chyle Other Clinician: Referring Eoghan Belcher: Treating Esta Carmon/Extender: Randalyn Rhea in Treatment: 12 Vital Signs Height(in): 67 Pulse(bpm): 87 Weight(lbs): 106 Blood Pressure(mmHg): 119/72 Body Mass Index(BMI): 16.6 Temperature(F): 97.8 Respiratory Rate(breaths/min): 18 Photos: [N/A:N/A] Sacrum N/A N/A Wound Location: Pressure Injury N/A N/A Wounding Event: Pressure Ulcer N/A N/A Primary Etiology: Cataracts, Osteoarthritis N/A N/A Comorbid History: 02/17/2022 N/A N/A Date Acquired: 12 N/A N/A Weeks of  Treatment: Open N/A N/A Wound Status: No N/A N/A Wound Recurrence: 0.3x0.2x1 N/A N/A Measurements L x W x D (cm) 0.047 N/A N/A A (cm) : rea 0.047 N/A N/A Volume (cm) : 99.30% N/A N/A % Reduction in A rea: 99.80% N/A N/A % Reduction in Volume: 10 Starting Position 1 (o'clock): 1 Ending Position 1 (o'clock): 2 Maximum Distance 1 (cm): Yes N/A N/A Undermining: Category/Stage III N/A N/A Classification: Medium N/A N/A Exudate A mount: Serosanguineous N/A N/A Exudate Type: red, brown N/A N/A Exudate Color: Well defined,  not attached N/A N/A Wound Margin: Large (67-100%) N/A N/A Granulation A mount: Red, Pale N/A N/A Granulation Quality: None Present (0%) N/A N/A Necrotic A mount: Fat Layer (Subcutaneous Tissue): Yes N/A N/A Exposed Structures: Fascia: No Tendon: No Muscle: No Joint: No Bone: No Medium (34-66%) N/A N/A Epithelialization: Treatment Notes Wound #2 (Sacrum) Cleanser Soap and Water Discharge Instruction: May shower and wash wound with dial antibacterial soap and water prior to dressing change. Wound Cleanser Discharge Instruction: Cleanse the wound with wound cleanser prior to applying a clean dressing using gauze sponges, not tissue or cotton balls. Peri-Wound Care Skin Prep Discharge Instruction: Use skin prep as directed Topical Primary Dressing Iodoform packing strip 1/4 (in) Discharge Instruction: Lightly pack into tunneling and undermining. Secondary Dressing ALLEVYN Gentle Border, 4x4 (in/in) Discharge Instruction: Apply over primary dressing as directed. Secured With Compression Wrap Compression Stockings Environmental education officer) Signed: 06/07/2022 1:56:11 PM By: Fredirick Maudlin MD FACS Signed: 06/10/2022 4:52:29 PM By: Baruch Gouty RN, BSN Entered By: Fredirick Maudlin on 06/07/2022 13:56:11 -------------------------------------------------------------------------------- Multi-Disciplinary Care Plan Details Patient  Name: Date of Service: KRISTIANA, JACKO 06/07/2022 1:15 PM Medical Record Number: [id number] Patient Account Number: [id number] Date of Birth/Sex: Treating RN: 05-Apr-1951 (71 y.o. Helene Shoe, Tammi Klippel Primary Care Jabez Molner: Dimas Chyle Other Clinician: Referring Kearney Evitt: Treating Cordney Barstow/Extender: Randalyn Rhea in Treatment: [place] reviewed with physician Active Inactive Pressure Nursing Diagnoses: Knowledge deficit related to causes and risk factors for pressure ulcer development Knowledge deficit related to management of pressures ulcers Potential for impaired tissue integrity related to pressure, friction, moisture, and shear Goals: Patient/caregiver will verbalize risk factors for pressure ulcer development Date Initiated: 03/12/2022 Date Inactivated: 04/11/2022 Target Resolution Date: 04/12/2022 Goal Status: Met Patient/caregiver will verbalize understanding of pressure ulcer management Date Initiated: 03/12/2022 Target Resolution Date: 07/11/2022 Goal Status: Active Interventions: Assess: immobility, friction, shearing, incontinence upon admission and as needed Assess offloading mechanisms upon admission and as needed Assess potential for pressure ulcer upon admission and as needed Provide education on pressure ulcers Notes: Electronic Signature(s) Signed: 06/07/2022 5:06:10 PM By: Deon Pilling RN, BSN Entered By: Deon Pilling on 06/07/2022 13:27:43 -------------------------------------------------------------------------------- Pain Assessment Details Patient Name: Date of Service: [name] [name] 06/07/2022 1:15 PM Medical Record Number: [id number] Patient Account Number: [id number] Date of Birth/Sex: Treating RN: 27-Oct-1951 (71 y.o. [name] Primary Care Janneth Krasner: Dimas Chyle Other Clinician: Referring Avry Monteleone: Treating Stepanie Graver/Extender: Randalyn Rhea in Treatment: 12 Active  Problems Location of Pain Severity and Description of Pain Patient Has Paino No Site Locations Pain Management and Medication Current Pain Management: Electronic Signature(s) Signed: 06/10/2022 4:32:05 PM By: Sandre Kitty Signed: 06/10/2022 4:52:29 PM By: Baruch Gouty RN, BSN Entered By: Sandre Kitty on 06/07/2022 13:09:59 -------------------------------------------------------------------------------- Patient/Caregiver Education Details Patient Name: Date of Service: [name] [name] 8/11/2023andnbsp1:15 PM Medical Record Number: [id number] Patient Account Number: [id number] Date of Birth/Gender: Treating RN: 04/26/1951 (71 y.o. Debby Bud Primary Care Physician: Dimas Chyle Other Clinician: Referring Physician: Treating Physician/Extender: Randalyn Rhea in Treatment: 12 Education Assessment Education Provided To: Patient and Caregiver daughter Education Topics Provided Pressure: Handouts: Pressure Ulcers: Care and Offloading, Pressure Ulcers: Care and Offloading 2 Methods: Explain/Verbal Responses: Reinforcements needed Electronic Signature(s) Signed: 06/07/2022 5:06:10 PM By: Deon Pilling RN, BSN Signed: 06/07/2022 5:06:10 PM By: Deon Pilling RN, BSN Entered By: Deon Pilling on 06/07/2022 13:27:57 -------------------------------------------------------------------------------- Wound Assessment Details Patient Name: Date of Service: [name] [name] 06/07/2022 1:15 PM Medical Record Number: [id number] Patient  Account Number: [id number] Date of Birth/Sex: Treating RN: 11/06/1950 (71 y.o. Helene Shoe, Tammi Klippel Primary Care Kimoni Pickerill: Dimas Chyle Other Clinician: Referring Jamila Slatten: Treating Saladin Petrelli/Extender: Randalyn Rhea in Treatment: 12 Wound Status Wound Number: 2 Primary Etiology: Pressure Ulcer Wound Location: Sacrum Wound Status: Open Wounding Event: Pressure Injury Comorbid History: Cataracts,  Osteoarthritis Date Acquired: 02/17/2022 Weeks Of Treatment: 12 Clustered Wound: No Photos Wound Measurements Length: (cm) 0.3 Width: (cm) 0.2 Depth: (cm) 1 Area: (cm) 0.047 Volume: (cm) 0.047 % Reduction in Area: 99.3% % Reduction in Volume: 99.8% Epithelialization: Medium (34-66%) Tunneling: No Undermining: Yes Starting Position (o'clock): 10 Ending Position (o'clock): 1 Maximum Distance: (cm) 2 Wound Description Classification: Category/Stage III Wound Margin: Well defined, not attached Exudate Amount: Medium Exudate Type: Serosanguineous Exudate Color: red, brown Foul Odor After Cleansing: No Slough/Fibrino No Wound Bed Granulation Amount: Large (67-100%) Exposed Structure Granulation Quality: Red, Pale Fascia Exposed: No Necrotic Amount: None Present (0%) Fat Layer (Subcutaneous Tissue) Exposed: Yes Tendon Exposed: No Muscle Exposed: No Joint Exposed: No Bone Exposed: No Treatment Notes Wound #2 (Sacrum) Cleanser Soap and Water Discharge Instruction: May shower and wash wound with dial antibacterial soap and water prior to dressing change. Wound Cleanser Discharge Instruction: Cleanse the wound with wound cleanser prior to applying a clean dressing using gauze sponges, not tissue or cotton balls. Peri-Wound Care Skin Prep Discharge Instruction: Use skin prep as directed Topical Primary Dressing Iodoform packing strip 1/4 (in) Discharge Instruction: Lightly pack into tunneling and undermining. Secondary Dressing ALLEVYN Gentle Border, 4x4 (in/in) Discharge Instruction: Apply over primary dressing as directed. Secured With Compression Wrap Compression Stockings Environmental education officer) Signed: 06/07/2022 5:06:10 PM By: Deon Pilling RN, BSN Entered By: Deon Pilling on 06/07/2022 13:27:00 -------------------------------------------------------------------------------- Vitals Details Patient Name: Date of Service: [name] [name] 06/07/2022  1:15 PM Medical Record Number: [id number] Patient Account Number: [id number] Date of Birth/Sex: Treating RN: Apr 06, 1951 (71 y.o. [name] Primary Care Jeremy Ditullio: Dimas Chyle Other Clinician: Referring Emmaline Wahba: Treating Coty Larsh/Extender: Randalyn Rhea in Treatment: 12 Vital Signs Time Taken: 13:09 Temperature (F): 97.8 Height (in): 67 Pulse (bpm): 87 Weight (lbs): 106 Respiratory Rate (breaths/min): 18 Body Mass Index (BMI): 16.6 Blood Pressure (mmHg): 119/72 Reference Range: 80 - 120 mg / dl Electronic Signature(s) Signed: 06/10/2022 4:32:05 PM By: Sandre Kitty Entered By: Sandre Kitty on 06/07/2022 13:09:45

## 2022-06-14 NOTE — Telephone Encounter (Signed)
Caller States: -This medication was discussed with PCP team at a prior appointment. -pt would like to begin anxiety medication recommended - "alprazolam"   Caller requests: -RX to be sent to pharmacy listed below.    [facility] [phone]  [place] [facility]  [address]   [place], [place] [address] Phone   [phone]

## 2022-06-15 DIAGNOSIS — L89154 Pressure ulcer of sacral region, stage 4: Secondary | ICD-10-CM | POA: Diagnosis not present

## 2022-06-15 DIAGNOSIS — K632 Fistula of intestine: Secondary | ICD-10-CM | POA: Diagnosis not present

## 2022-06-15 DIAGNOSIS — G20 Parkinson's disease: Secondary | ICD-10-CM | POA: Diagnosis not present

## 2022-06-15 DIAGNOSIS — J9601 Acute respiratory failure with hypoxia: Secondary | ICD-10-CM | POA: Diagnosis not present

## 2022-06-15 NOTE — Telephone Encounter (Signed)
See note

## 2022-06-17 NOTE — Telephone Encounter (Signed)
Can we clarify? She has been on valium in the past. This is different than alprazolam or xanax. If she wants to make a change please ask her to schedule an office visit.   Algis Greenhouse. Jerline Pain, MD 06/17/2022 9:02 AM

## 2022-06-21 ENCOUNTER — Encounter (HOSPITAL_BASED_OUTPATIENT_CLINIC_OR_DEPARTMENT_OTHER): Payer: Medicare HMO | Admitting: General Surgery

## 2022-06-21 DIAGNOSIS — L89153 Pressure ulcer of sacral region, stage 3: Secondary | ICD-10-CM | POA: Diagnosis not present

## 2022-06-21 DIAGNOSIS — G20 Parkinson's disease: Secondary | ICD-10-CM | POA: Diagnosis not present

## 2022-06-21 NOTE — Progress Notes (Signed)
JABRIA, LOOS ([id number]) Visit Report for 06/21/2022 Chief Complaint Document Details Patient Name: Date of Service: ALAYNA, MABE 06/21/2022 3:30 PM Medical Record Number: [id number] Patient Account Number: [id number] Date of Birth/Sex: Treating RN: 03/25/1951 (71 y.o. [name] Primary Care Provider: Dimas Chyle Other Clinician: Referring Provider: Treating Provider/Extender: Randalyn Rhea in Treatment: 14 Information Obtained from: Patient Chief Complaint The patient presents to the wound care center today for evaluation of a stage III sacral pressure ulcer as well as a dehisced surgical wound from an emergent femoral hernia repair. Electronic Signature(s) Signed: 06/21/2022 4:31:20 PM By: Fredirick Maudlin MD FACS Entered By: Fredirick Maudlin on 06/21/2022 16:31:20 -------------------------------------------------------------------------------- HPI Details Patient Name: Date of Service: [name] 06/21/2022 3:30 PM Medical Record Number: [id number] Patient Account Number: [id number] Date of Birth/Sex: Treating RN: 03/24/1951 (71 y.o. [name] Primary Care Provider: Dimas Chyle Other Clinician: Referring Provider: Treating Provider/Extender: Randalyn Rhea in Treatment: 14 History of Present Illness HPI Description: ADMISSION 03/12/2022 This is a 71 year old woman who underwent an emergency femoral hernia repair secondary to strangulated small bowel. She required a bowel resection and reanastomosis. While she was in the hospital, the wound dehisced and she was initially felt to have an enterocutaneous fistula. They were managing the effluent with an ostomy bag. She saw her general surgeon on May 11. At that time, there was no evidence of EC fistula and the wound was closing in nicely. This is currently being managed with silver alginate. Also during her hospitalization, she unfortunately developed a  pressure ulcer. This is currently stage III. She is in a skilled nursing facility at the moment but will be discharged on Sunday. This wound is being managed with Dakin's wet to dry dressing changes. She is not a diabetic, nor does she smoke. Her only real medical history is Parkinson's disease and fairly significant protein-calorie malnutrition. 04/04/2022: The inguinal wound is closed. The sacral wound is a little bit smaller today. It is clean without significant slough or odor. It continues to undermine for about 4 to 5 cm in the cranial direction. 04/11/2022: The orifice of her sacral wound continues to contract. She still has extensive undermining present. The wound is clean without slough or odor. She did not receive a wound VAC yet because her insurance has a contract with a different provider than the one we usually use. 04/18/2022: The wound is contracting but still has a deep area that is undermined. Apparently the cost for the wound VAC that she would be responsible for is prohibitive. She currently has Medicaid pending. The wound surface is pink and healthy-looking. There is just a little bit of slough and biofilm on the surface. 04/25/2022: The patient and her family decided they wanted to go ahead with wound VAC application. It has been delivered to their home, but they did not bring it with them because they understood that home health would initiate the therapy. Today, the wound surface looks clean and viable with good granulation tissue, but there is a musty odor and blue-green drainage on her dressing, consistent with Pseudomonas aeruginosa. 05/02/2022: For unclear reasons, the patient was told that the levofloxacin that I prescribed was a sulfa derived antibiotic and she did not take it. It does not make sense and I am not sure how that happened, but I reiterated to the patient that it is not a sulfa derived antibiotic and she can go ahead and take the medication. The wound VAC was  initiated by home health on Monday. Unfortunately, it appears that they are not placing the foam all the way up into the undermined portion of the wound, as there is just a small button of foam identified when the wound VAC was removed today. There is some slough accumulation, as one might expect, within the wound bed. The periwound skin is intact. 05/17/2022: The wound VAC malfunctioned and was removed yesterday. On inspection, there was still a piece of sponge left in situ. It was removed and was a tiny piece of white sponge, clearly inadequate to fill the extensive undermining at the cranial portion of the wound. There has been no improvement in the undermining. She has hypertrophic granulation tissue at the orifice of the wound. Periwound skin is in good condition. 05/24/2022: The undermining has improved quite a bit since switching to just black foam; it only extends from 9-12 o'clock instead of circumferentially. The underlying wound surface has healthy-looking granulation tissue. No malodor or drainage. 06/07/2022: The wound orifice is quite small and the tunneling has come in substantially. No concern for infection. 06/21/2022: The patient was ill last week and could not attend clinic. Today, the tunneling/undermining has come in quite a bit. No purulent drainage or malodor. No concern for infection. Electronic Signature(s) Signed: 06/21/2022 4:32:17 PM By: Fredirick Maudlin MD FACS Entered By: Fredirick Maudlin on 06/21/2022 16:32:17 -------------------------------------------------------------------------------- Physical Exam Details Patient Name: Date of Service: ANDORA, KRULL 06/21/2022 3:30 PM Medical Record Number: [id number] Patient Account Number: [id number] Date of Birth/Sex: Treating RN: May 28, 1951 (71 y.o. [name] Primary Care Provider: Dimas Chyle Other Clinician: Referring Provider: Treating Provider/Extender: Randalyn Rhea in Treatment:  14 Constitutional . . . . No acute distress.Marland Kitchen Respiratory Normal work of breathing on room air.. Notes 06/21/2022: The wound orifice is about the same size, but the tunneling has contracted quite a bit. Electronic Signature(s) Signed: 06/21/2022 4:33:03 PM By: Fredirick Maudlin MD FACS Entered By: Fredirick Maudlin on 06/21/2022 16:33:02 -------------------------------------------------------------------------------- Physician Orders Details Patient Name: Date of Service: [name] 06/21/2022 3:30 PM Medical Record Number: [id number] Patient Account Number: [id number] Date of Birth/Sex: Treating RN: [birth year] (72 y.o. Debby Bud Primary Care Provider: Dimas Chyle Other Clinician: Referring Provider: Treating Provider/Extender: Randalyn Rhea in Treatment: [id number] Verbal / Phone Orders: No Diagnosis Coding ICD-10 Coding Code Description L89.153 Pressure ulcer of sacral region, stage 3 G20 Parkinson's disease Follow-up Appointments ppointment in 1 week. - Dr. Celine Ahr - Room 2- Return A Tuesday 07/02/2022 3pm Other: - Try and increase protein intake-Premier Protein Shakes are 30g Protein,goal is 100g a day ***PATIENT TO CALL WOUND VAC COMPANY FOR THEM TO PICK UP.*** Bathing/ Shower/ Hygiene May shower with protection but do not get wound dressing(s) wet. Negative Presssure Wound Therapy Wound #2 Sacrum Discontinue wound vac Off-Loading Low air-loss mattress (Group 2) Turn and reposition every 2 hours Home Health No change in wound care orders this week; continue Home Health for wound care. May utilize formulary equivalent dressing for wound treatment orders unless otherwise specified. - iodoform packing strips 1/4in. home health to continue to change. Dressing changes to be completed by [place] on Monday / Wednesday / Friday except when patient has scheduled visit at [facility]. Other Home Health Orders/Instructions: [facility] Wound  Treatment Wound #2 - Sacrum Cleanser: Soap and Water Every Other Day/30 Days Discharge Instructions: May shower and wash wound with dial antibacterial soap and water prior to dressing change. Cleanser: Wound Cleanser Every  Other Day/30 Days Discharge Instructions: Cleanse the wound with wound cleanser prior to applying a clean dressing using gauze sponges, not tissue or cotton balls. Peri-Wound Care: Skin Prep Every Other Day/30 Days Discharge Instructions: Use skin prep as directed Prim Dressing: Iodoform packing strip 1/4 (in) [facility]) Every Other Day/30 Days ary Discharge Instructions: Lightly pack into tunneling and undermining. Secondary Dressing: ALLEVYN Gentle Border, 4x4 (in/in) Every Other Day/30 Days Discharge Instructions: Apply over primary dressing as directed. Patient Medications llergies: Sulfa (Sulfonamide Antibiotics) A Notifications Medication Indication Start End 06/21/2022 lidocaine DOSE topical 4 % gel - gel topical applied only for debridements in the clinic by provider. Electronic Signature(s) Signed: 06/21/2022 4:55:05 PM By: Fredirick Maudlin MD FACS Entered By: Fredirick Maudlin on 06/21/2022 16:33:15 -------------------------------------------------------------------------------- Problem List Details Patient Name: Date of Service: [name] 06/21/2022 3:30 PM Medical Record Number: [id number] Patient Account Number: [id number] Date of Birth/Sex: Treating RN: April 02, 1951 (71 y.o. [name] Primary Care Provider: Dimas Chyle Other Clinician: Referring Provider: Treating Provider/Extender: Randalyn Rhea in Treatment: 14 Active Problems ICD-10 Encounter Code Description Active Date MDM Diagnosis L89.153 Pressure ulcer of sacral region, stage 3 03/12/2022 No Yes G20 Parkinson's disease 03/12/2022 No Yes Inactive Problems ICD-10 Code Description Active Date Inactive Date T81.31XA Disruption of external operation  (surgical) wound, not elsewhere classified, initial 03/12/2022 03/12/2022 encounter Resolved Problems Electronic Signature(s) Signed: 06/21/2022 4:31:10 PM By: Fredirick Maudlin MD FACS Entered By: Fredirick Maudlin on 06/21/2022 16:31:09 -------------------------------------------------------------------------------- Progress Note Details Patient Name: Date of Service: [name] 06/21/2022 3:30 PM Medical Record Number: [id number] Patient Account Number: [id number] Date of Birth/Sex: Treating RN: 1951-03-23 (71 y.o. [name] Primary Care Provider: Dimas Chyle Other Clinician: Referring Provider: Treating Provider/Extender: Randalyn Rhea in Treatment: 14 Subjective Chief Complaint Information obtained from Patient The patient presents to the wound care center today for evaluation of a stage III sacral pressure ulcer as well as a dehisced surgical wound from an emergent femoral hernia repair. History of Present Illness (HPI) ADMISSION 03/12/2022 This is a 71 year old woman who underwent an emergency femoral hernia repair secondary to strangulated small bowel. She required a bowel resection and reanastomosis. While she was in the hospital, the wound dehisced and she was initially felt to have an enterocutaneous fistula. They were managing the effluent with an ostomy bag. She saw her general surgeon on May 11. At that time, there was no evidence of EC fistula and the wound was closing in nicely. This is currently being managed with silver alginate. Also during her hospitalization, she unfortunately developed a pressure ulcer. This is currently stage III. She is in a skilled nursing facility at the moment but will be discharged on Sunday. This wound is being managed with Dakin's wet to dry dressing changes. She is not a diabetic, nor does she smoke. Her only real medical history is Parkinson's disease and fairly significant protein-calorie  malnutrition. 04/04/2022: The inguinal wound is closed. The sacral wound is a little bit smaller today. It is clean without significant slough or odor. It continues to undermine for about 4 to 5 cm in the cranial direction. 04/11/2022: The orifice of her sacral wound continues to contract. She still has extensive undermining present. The wound is clean without slough or odor. She did not receive a wound VAC yet because her insurance has a contract with a different provider than the one we usually use. 04/18/2022: The wound is contracting but still has a deep area that  is undermined. Apparently the cost for the wound VAC that she would be responsible for is prohibitive. She currently has Medicaid pending. The wound surface is pink and healthy-looking. There is just a little bit of slough and biofilm on the surface. 04/25/2022: The patient and her family decided they wanted to go ahead with wound VAC application. It has been delivered to their home, but they did not bring it with them because they understood that home health would initiate the therapy. Today, the wound surface looks clean and viable with good granulation tissue, but there is a musty odor and blue-green drainage on her dressing, consistent with Pseudomonas aeruginosa. 05/02/2022: For unclear reasons, the patient was told that the levofloxacin that I prescribed was a sulfa derived antibiotic and she did not take it. It does not make sense and I am not sure how that happened, but I reiterated to the patient that it is not a sulfa derived antibiotic and she can go ahead and take the medication. The wound VAC was initiated by home health on Monday. Unfortunately, it appears that they are not placing the foam all the way up into the undermined portion of the wound, as there is just a small button of foam identified when the wound VAC was removed today. There is some slough accumulation, as one might expect, within the wound bed. The periwound skin is  intact. 05/17/2022: The wound VAC malfunctioned and was removed yesterday. On inspection, there was still a piece of sponge left in situ. It was removed and was a tiny piece of white sponge, clearly inadequate to fill the extensive undermining at the cranial portion of the wound. There has been no improvement in the undermining. She has hypertrophic granulation tissue at the orifice of the wound. Periwound skin is in good condition. 05/24/2022: The undermining has improved quite a bit since switching to just black foam; it only extends from 9-12 o'clock instead of circumferentially. The underlying wound surface has healthy-looking granulation tissue. No malodor or drainage. 06/07/2022: The wound orifice is quite small and the tunneling has come in substantially. No concern for infection. 06/21/2022: The patient was ill last week and could not attend clinic. Today, the tunneling/undermining has come in quite a bit. No purulent drainage or malodor. No concern for infection. Patient History Information obtained from Patient, Caregiver. Family History Cancer - Mother,Father,Siblings, Hypertension - Siblings,Child. Social History Never smoker, Marital Status - Divorced, Alcohol Use - Never, Drug Use - No History, Caffeine Use - Moderate - Coffee, soda. Medical History Eyes Patient has history of Cataracts Musculoskeletal Patient has history of Osteoarthritis Medical A Surgical History Notes nd Gastrointestinal GERD Musculoskeletal degenerative disc disease Objective Constitutional No acute distress.. Vitals Time Taken: 3:15 PM, Height: 67 in, Weight: 106 lbs, BMI: 16.6, Temperature: 98.2 F, Pulse: 79 bpm, Respiratory Rate: 18 breaths/min, Blood Pressure: 121/80 mmHg. Respiratory Normal work of breathing on room air.. General Notes: 06/21/2022: The wound orifice is about the same size, but the tunneling has contracted quite a bit. Integumentary (Hair, Skin) Wound #2 status is Open. Original  cause of wound was Pressure Injury. The date acquired was: 02/17/2022. The wound has been in treatment 14 weeks. The wound is located on the Sacrum. The wound measures 0.4cm length x 0.4cm width x 1cm depth; 0.126cm^2 area and 0.126cm^3 volume. There is Fat Layer (Subcutaneous Tissue) exposed. There is no undermining noted, however, there is tunneling at 12:00 with a maximum distance of 1.4cm. There is a medium amount of purulent  drainage noted. The wound margin is well defined and not attached to the wound base. There is large (67-100%) red, pale granulation within the wound bed. There is no necrotic tissue within the wound bed. Assessment Active Problems ICD-10 Pressure ulcer of sacral region, stage 3 Parkinson's disease Plan Follow-up Appointments: Return Appointment in 1 week. - Dr. Celine Ahr - Room 2- Tuesday 07/02/2022 3pm Other: - Try and increase protein intake-Premier Protein Shakes are 30g Protein,goal is 100g a day ***PATIENT TO CALL WOUND VAC COMPANY FOR THEM TO PICK UP.*** Bathing/ Shower/ Hygiene: May shower with protection but do not get wound dressing(s) wet. Negative Presssure Wound Therapy: Wound #2 Sacrum: Discontinue wound vac Off-Loading: Low air-loss mattress (Group 2) Turn and reposition every 2 hours Home Health: No change in wound care orders this week; continue Home Health for wound care. May utilize formulary equivalent dressing for wound treatment orders unless otherwise specified. - iodoform packing strips 1/4in. home health to continue to change. Dressing changes to be completed by [place] on Monday / Wednesday / Friday except when patient has scheduled visit at [facility]. Other Home Health Orders/Instructions: [facility] [place] The following medication(s) was prescribed: lidocaine topical 4 % gel gel topical applied only for debridements in the clinic by provider. was prescribed at facility WOUND #2: - Sacrum Wound Laterality: Cleanser: Soap and Water Every  Other Day/30 Days Discharge Instructions: May shower and wash wound with dial antibacterial soap and water prior to dressing change. Cleanser: Wound Cleanser Every Other Day/30 Days Discharge Instructions: Cleanse the wound with wound cleanser prior to applying a clean dressing using gauze sponges, not tissue or cotton balls. Peri-Wound Care: Skin Prep Every Other Day/30 Days Discharge Instructions: Use skin prep as directed Prim Dressing: Iodoform packing strip 1/4 (in) [facility]) Every Other Day/30 Days ary Discharge Instructions: Lightly pack into tunneling and undermining. Secondary Dressing: ALLEVYN Gentle Border, 4x4 (in/in) Every Other Day/30 Days Discharge Instructions: Apply over primary dressing as directed. 06/21/2022: The wound orifice is about the same size, but the tunneling has contracted quite a bit. No debridement was necessary. I think even if we wanted to resume using the VAC, the orifice was too small to admit any sponge. We will continue to pack the wound with iodoform packing strips. She will follow-up in 1 week. Electronic Signature(s) Signed: 06/21/2022 4:33:56 PM By: Fredirick Maudlin MD FACS Entered By: Fredirick Maudlin on 06/21/2022 16:33:56 -------------------------------------------------------------------------------- HxROS Details Patient Name: Date of Service: [name] 06/21/2022 3:30 PM Medical Record Number: [id number] Patient Account Number: [id number] Date of Birth/Sex: Treating RN: 17-Dec-1950 (71 y.o. [name] Primary Care Provider: Dimas Chyle Other Clinician: Referring Provider: Treating Provider/Extender: Randalyn Rhea in Treatment: 14 Information Obtained From Patient Caregiver Eyes Medical History: Positive for: Cataracts Gastrointestinal Medical History: Past Medical History Notes: GERD Musculoskeletal Medical History: Positive for: Osteoarthritis Past Medical History Notes: degenerative disc  disease HBO Extended History Items Eyes: Cataracts Immunizations Pneumococcal Vaccine: Received Pneumococcal Vaccination: Yes Received Pneumococcal Vaccination On or After 60th Birthday: No Implantable Devices None Family and Social History Cancer: Yes - Mother,Father,Siblings; Hypertension: Yes - Siblings,Child; Never smoker; Marital Status - Divorced; Alcohol Use: Never; Drug Use: No History; Caffeine Use: Moderate - Coffee, soda; Financial Concerns: No; Food, Clothing or Shelter Needs: No; Support System Lacking: No; Transportation Concerns: No Engineer, maintenance) Signed: 06/21/2022 4:55:05 PM By: Fredirick Maudlin MD FACS Signed: 06/21/2022 5:36:58 PM By: Baruch Gouty RN, BSN Entered By: Fredirick Maudlin on 06/21/2022 16:32:21 --------------------------------------------------------------------------------  SuperBill Details Patient Name: Date of Service: AXEL, MEAS 06/21/2022 Medical Record Number: [id number] Patient Account Number: [id number] Date of Birth/Sex: Treating RN: 1951-10-03 (71 y.o. Debby Bud Primary Care Provider: Dimas Chyle Other Clinician: Referring Provider: Treating Provider/Extender: Randalyn Rhea in Treatment: 14 Diagnosis Coding ICD-10 Codes Code Description [phone] Pressure ulcer of sacral region, stage 3 G20 Parkinson's disease Facility Procedures CPT4 Code: [id number] Description: 99213 - WOUND CARE VISIT-LEV 3 EST PT Modifier: Quantity: 1 Physician Procedures : CPT4 Code Description Modifier [id number] - WC PHYS LEVEL 4 - EST PT ICD-10 Diagnosis Description L89.153 Pressure ulcer of sacral region, stage 3 G20 Parkinson's disease Quantity: 1 Electronic Signature(s) Signed: 06/21/2022 4:34:07 PM By: Fredirick Maudlin MD FACS Entered By: Fredirick Maudlin on 06/21/2022 16:34:07

## 2022-06-24 NOTE — Progress Notes (Signed)
[name] [name], [name] [name] ([id number]) Visit Report for 06/21/2022 Arrival Information Details Patient Name: Date of Service: [name] [name], [name] [name] 06/21/2022 3:30 PM Medical Record Number: [id number] Patient Account Number: [id number] Date of Birth/Sex: Treating RN: 15-Jan-1951 (71 y.o. [name] [name], [name] [name] Primary Care Mallary Kreger: [name] [name] Other Clinician: Referring [name] [name]: Treating [name] [name]/Extender: [name] [name]: 14 Visit Information History Since Last Visit Added or deleted any medications: No Patient Arrived: Ambulatory Any new allergies or adverse reactions: No Arrival Time: 15:15 Had a fall or experienced change in No Accompanied By: self activities of daily living that may affect Transfer Assistance: None risk of falls: Patient Identification Verified: Yes Signs or symptoms of abuse/neglect since last visito No Secondary Verification Process Completed: Yes Hospitalized since last visit: No Patient Requires Transmission-Based Precautions: No Implantable device outside of the clinic excluding No Patient Has Alerts: No cellular tissue based products placed in the center since last visit: Has Dressing in Place as Prescribed: Yes Pain Present Now: No Electronic Signature(s) Signed: 06/24/2022 5:20:56 PM By: [name] [name] Kitty Entered By: [name] [name] Kitty on 06/21/2022 15:15:38 -------------------------------------------------------------------------------- Clinic Level of Care Assessment Details Patient Name: Date of Service: [name] [name], [name] [name] 06/21/2022 3:30 PM Medical Record Number: [id number] Patient Account Number: [id number] Date of Birth/Sex: Treating RN: 1951-05-29 (71 y.o. [name] [name], [name] [name] Primary Care Amunique Neyra: [name] [name] Other Clinician: Referring Chriss Redel: Treating Dhana Totton/Extender: [name] [name]: 14 Clinic Level of Care Assessment Items TOOL 4 Quantity Score X- 1 0 Use when only an EandM is  performed on FOLLOW-UP visit ASSESSMENTS - Nursing Assessment / Reassessment X- 1 10 Reassessment of Co-morbidities (includes updates in patient status) X- 1 5 Reassessment of Adherence to [name] Plan ASSESSMENTS - Wound and Skin A ssessment / Reassessment X - Simple Wound Assessment / Reassessment - one wound 1 5 _0  - 0 Complex Wound Assessment / Reassessment - multiple wounds _1  - 0 Dermatologic / Skin Assessment (not related to wound area) ASSESSMENTS - Focused Assessment _2  - 0 Circumferential Edema Measurements - multi extremities _3  - 0 Nutritional Assessment / Counseling / Intervention _4  - 0 Lower Extremity Assessment (monofilament, tuning fork, pulses) _5  - 0 Peripheral Arterial Disease Assessment (using hand held doppler) ASSESSMENTS - Ostomy and/or Continence Assessment and Care _6  - 0 Incontinence Assessment and Management _7  - 0 Ostomy Care Assessment and Management (repouching, etc.) PROCESS - Coordination of Care X - Simple Patient / Family Education for ongoing care 1 15 _8  - 0 Complex (extensive) Patient / Family Education for ongoing care X- 1 10 Staff obtains Programmer, systems, Records, T Results / Process Orders est _9  - 0 Staff telephones HHA, Nursing Homes / Clarify orders / etc _10  - 0 Routine Transfer to another Facility (non-emergent condition) _11  - 0 Routine Hospital Admission (non-emergent condition) _12  - 0 New Admissions / Biomedical engineer / Ordering NPWT Apligraf, etc. , _13  - 0 Emergency Hospital Admission (emergent condition) X- 1 10 Simple Discharge Coordination _14  - 0 Complex (extensive) Discharge Coordination PROCESS - Special Needs _15  - 0 Pediatric / Minor Patient Management _16  - 0 Isolation Patient Management _17  - 0 Hearing / Language / Visual special needs _18  - 0 Assessment of Community assistance (transportation, D/C planning, etc.) _19  - 0 Additional assistance / Altered mentation _20  - 0 Support Surface(s) Assessment  (bed, cushion, seat, etc.) INTERVENTIONS - Wound Cleansing / Measurement X - Simple Wound Cleansing - one wound 1 5 _21  - 0 Complex Wound Cleansing - multiple wounds X- 1 5 Wound  Imaging (photographs - any number of wounds) _0  - 0 Wound Tracing (instead of photographs) _1  - 0 Simple Wound Measurement - one wound _2  - 0 Complex Wound Measurement - multiple wounds INTERVENTIONS - Wound Dressings X - Small Wound Dressing one or multiple wounds 1 10 _3  - 0 Medium Wound Dressing one or multiple wounds _4  - 0 Large Wound Dressing one or multiple wounds <OVZCHYIFOYDXAJOI>_7<\/OMVEHMCNOBSJGGEZ>_6  - 0 Application of Medications - topical <OQHUTMLYYTKPTWSF>_6<\/CLEXNTZGYFVCBSWH>_6  - 0 Application of Medications - injection INTERVENTIONS - Miscellaneous _7  - 0 External ear exam _8  - 0 Specimen Collection (cultures, biopsies, blood, body fluids, etc.) _9  - 0 Specimen(s) / Culture(s) sent or taken to Lab for analysis _10  - 0 Patient Transfer (multiple staff / Civil Service fast streamer / Similar devices) _11  - 0 Simple Staple / Suture removal (25 or less) _12  - 0 Complex Staple / Suture removal (26 or more) _13  - 0 Hypo / Hyperglycemic Management (close monitor of Blood Glucose) _14  - 0 Ankle / Brachial Index (ABI) - do not check if billed separately X- 1 5 Vital Signs Has the patient been seen at the hospital within the last three years: Yes Total Score: 80 Level Of Care: New/Established - Level 3 Electronic Signature(s) Signed: 06/21/2022 5:57:00 PM By: [name] Pilling RN, [name] [name] Entered By: [name] [name] on 06/21/2022 15:43:49 -------------------------------------------------------------------------------- Encounter Discharge Information Details Patient Name: Date of Service: [name] [name] 06/21/2022 3:30 PM Medical Record Number: [id number] Patient Account Number: [id number] Date of Birth/Sex: Treating RN: Jan 17, 1951 (71 y.o. [name] [name] Primary Care Dontreal Miera: [name] [name] Other Clinician: Referring Harshan Kearley: Treating Tarita Deshmukh/Extender: [name] [name]: 14 Encounter Discharge Information Items Discharge Condition: Stable Ambulatory Status: Walker Discharge Destination: Home Transportation: Private Auto Accompanied By: self Schedule Follow-up Appointment: Yes Clinical Summary of Care: Electronic Signature(s) Signed: 06/21/2022 5:57:00 PM By: [name] Pilling RN, [name] [name] Entered By: [name] [name] on 06/21/2022 15:44:18 -------------------------------------------------------------------------------- Lower Extremity Assessment Details Patient Name: Date of Service: ESLY, SELVAGE 06/21/2022 3:30 PM Medical Record Number: [id number] Patient Account Number: [id number] Date of Birth/Sex: Treating RN: [birth year] (71 y.o. [name] [name] Primary Care Toye Rouillard: [name] [name] Other Clinician: Referring Suleyman Ehrman: Treating Alois Colgan/Extender: [name] [name]: 14 Electronic Signature(s) Signed: 06/21/2022 5:57:00 PM By: [name] Pilling RN, [name] [name] Entered By: [name] [name] on 06/21/2022 15:27:12 -------------------------------------------------------------------------------- Multi Wound Chart Details Patient Name: Date of Service: [name] [name] 06/21/2022 3:30 PM Medical Record Number: [id number] Patient Account Number: [id number] Date of Birth/Sex: Treating RN: April 18, 1951 (71 y.o. [name] [name] Primary Care Annice Jolly: [name] [name] Other Clinician: Referring Pepper Wyndham: Treating Kyrell Ruacho/Extender: [name] [name]: 14 Vital Signs Height(in): 67 Pulse(bpm): 79 Weight(lbs): 106 Blood Pressure(mmHg): 121/80 Body Mass Index(BMI): 16.6 Temperature(F): 98.2 Respiratory Rate(breaths/min): 18 Photos: [N/A:N/A] Sacrum N/A N/A Wound Location: Pressure Injury N/A N/A Wounding Event: Pressure Ulcer N/A N/A Primary Etiology: Cataracts, Osteoarthritis N/A N/A Comorbid History: 02/17/2022 N/A N/A Date Acquired: 14 N/A N/A Weeks of [name]: Open N/A  N/A Wound Status: No N/A N/A Wound Recurrence: 0.4x0.4x1 N/A N/A Measurements L x W x D (cm) 0.126 N/A N/A A (cm) : rea 0.126 N/A N/A Volume (cm) : 98.10% N/A N/A % Reduction in A rea: 99.40% N/A N/A % Reduction in Volume: 12 Position 1 (o'clock): 1.4 Maximum Distance 1 (cm): Yes N/A N/A Tunneling: Category/Stage III N/A N/A Classification: Medium N/A N/A Exudate A mount: Purulent N/A N/A Exudate Type: yellow, brown, green N/A N/A Exudate Color: Well defined, not attached N/A N/A Wound  Margin: Large (67-100%) N/A N/A Granulation A mount: Red, Pale N/A N/A Granulation Quality: None Present (0%) N/A N/A Necrotic A mount: Fat Layer (Subcutaneous Tissue): Yes N/A N/A Exposed Structures: Fascia: No Tendon: No Muscle: No Joint: No Bone: No Medium (34-66%) N/A N/A Epithelialization: [name] Notes Wound #2 (Sacrum) Cleanser Soap and Water Discharge Instruction: May shower and wash wound with dial antibacterial soap and water prior to dressing change. Wound Cleanser Discharge Instruction: Cleanse the wound with wound cleanser prior to applying a clean dressing using gauze sponges, not tissue or cotton balls. Peri-Wound Care Skin Prep Discharge Instruction: Use skin prep as directed Topical Primary Dressing Iodoform packing strip 1/4 (in) Discharge Instruction: Lightly pack into tunneling and undermining. Secondary Dressing ALLEVYN Gentle Border, 4x4 (in/in) Discharge Instruction: Apply over primary dressing as directed. Secured With Compression Wrap Compression Stockings Environmental education officer) Signed: 06/21/2022 4:31:15 PM By: Fredirick Maudlin MD FACS Signed: 06/21/2022 5:36:58 PM By: Baruch Gouty RN, [name] [name] Entered By: Fredirick Maudlin on 06/21/2022 16:31:15 -------------------------------------------------------------------------------- Multi-Disciplinary Care Plan Details Patient Name: Date of Service: BRADEN, DELOACH 06/21/2022 3:30  PM Medical Record Number: [id number] Patient Account Number: [id number] Date of Birth/Sex: Treating RN: [birth year] (71 y.o. [name] [name], [name] [name] Primary Care Traycen Goyer: [name] [name] Other Clinician: Referring Wing Gfeller: Treating Antonio Woodhams/Extender: [name] [name]: 14 Multidisciplinary Care Plan reviewed with physician Active Inactive Pressure Nursing Diagnoses: Knowledge deficit related to causes and risk factors for pressure ulcer development Knowledge deficit related to management of pressures ulcers Potential for impaired tissue integrity related to pressure, friction, moisture, and shear Goals: Patient/caregiver will verbalize risk factors for pressure ulcer development Date Initiated: 03/12/2022 Date Inactivated: 04/11/2022 Target Resolution Date: 04/12/2022 Goal Status: Met Patient/caregiver will verbalize understanding of pressure ulcer management Date Initiated: 03/12/2022 Target Resolution Date: 07/11/2022 Goal Status: Active Interventions: Assess: immobility, friction, shearing, incontinence upon admission and as needed Assess offloading mechanisms upon admission and as needed Assess potential for pressure ulcer upon admission and as needed Provide education on pressure ulcers Notes: Electronic Signature(s) Signed: 06/21/2022 5:57:00 PM By: [name] Pilling RN, [name] [name] Entered By: [name] [name] on 06/21/2022 15:29:07 -------------------------------------------------------------------------------- Pain Assessment Details Patient Name: Date of Service: [name] [name] 06/21/2022 3:30 PM Medical Record Number: [id number] Patient Account Number: [id number] Date of Birth/Sex: Treating RN: 1951-06-05 (71 y.o. [name] [name] Primary Care Dona Walby: [name] [name] Other Clinician: Referring Mekayla Soman: Treating Pakou Rainbow/Extender: [name] [name]: 14 Active Problems Location of Pain Severity and Description of  Pain Patient Has Paino No Site Locations Pain Management and Medication Current Pain Management: Electronic Signature(s) Signed: 06/21/2022 5:36:58 PM By: Baruch Gouty RN, [name] [name] Signed: 06/24/2022 5:20:56 PM By: [name] [name] Kitty Entered By: [name] [name] Kitty on 06/21/2022 15:16:05 -------------------------------------------------------------------------------- Patient/Caregiver Education Details Patient Name: Date of Service: [name] [name] 8/25/2023andnbsp3:30 PM Medical Record Number: [id number] Patient Account Number: [id number] Date of Birth/Gender: Treating RN: 1951/08/30 (71 y.o. [name] [name] Primary Care Physician: [name] [name] Other Clinician: Referring Physician: Treating Physician/Extender: [name] [name]: 14 Education Assessment Education Provided To: Patient Education Topics Provided Pressure: Handouts: Pressure Ulcers: Care and Offloading 2 Methods: Explain/Verbal Responses: Reinforcements needed Electronic Signature(s) Signed: 06/21/2022 5:57:00 PM By: [name] Pilling RN, [name] [name] Signed: 06/21/2022 5:57:00 PM By: [name] Pilling RN, [name] [name] Entered By: [name] [name] on 06/21/2022 15:29:20 -------------------------------------------------------------------------------- Wound Assessment Details Patient Name: Date of Service: [name] [name] 06/21/2022 3:30 PM Medical Record Number: [id number] Patient Account Number: [id number] Date of Birth/Sex: Treating RN: 03-11-1951 (71 y.o. F) Deaton,  Bobbi Primary Care Keiston Manley: [name] [name] Other Clinician: Referring Seleena Reimers: Treating Kaegan Hettich/Extender: [name] [name]: 14 Wound Status Wound Number: 2 Primary Etiology: Pressure Ulcer Wound Location: Sacrum Wound Status: Open Wounding Event: Pressure Injury Comorbid History: Cataracts, Osteoarthritis Date Acquired: 02/17/2022 Weeks Of [name]: 14 Clustered Wound: No Photos Wound  Measurements Length: (cm) 0.4 Width: (cm) 0.4 Depth: (cm) 1 Area: (cm) 0.126 Volume: (cm) 0.126 % Reduction in Area: 98.1% % Reduction in Volume: 99.4% Epithelialization: Medium (34-66%) Tunneling: Yes Position (o'clock): 12 Maximum Distance: (cm) 1.4 Undermining: No Wound Description Classification: Category/Stage III Wound Margin: Well defined, not attached Exudate Amount: Medium Exudate Type: Purulent Exudate Color: yellow, brown, green Foul Odor After Cleansing: No Slough/Fibrino No Wound Bed Granulation Amount: Large (67-100%) Exposed Structure Granulation Quality: Red, Pale Fascia Exposed: No Necrotic Amount: None Present (0%) Fat Layer (Subcutaneous Tissue) Exposed: Yes Tendon Exposed: No Muscle Exposed: No Joint Exposed: No Bone Exposed: No [name] Notes Wound #2 (Sacrum) Cleanser Soap and Water Discharge Instruction: May shower and wash wound with dial antibacterial soap and water prior to dressing change. Wound Cleanser Discharge Instruction: Cleanse the wound with wound cleanser prior to applying a clean dressing using gauze sponges, not tissue or cotton balls. Peri-Wound Care Skin Prep Discharge Instruction: Use skin prep as directed Topical Primary Dressing Iodoform packing strip 1/4 (in) Discharge Instruction: Lightly pack into tunneling and undermining. Secondary Dressing ALLEVYN Gentle Border, 4x4 (in/in) Discharge Instruction: Apply over primary dressing as directed. Secured With Compression Wrap Compression Stockings Environmental education officer) Signed: 06/21/2022 5:57:00 PM By: [name] Pilling RN, [name] [name] Entered By: [name] [name] on 06/21/2022 15:28:50 -------------------------------------------------------------------------------- Vitals Details Patient Name: Date of Service: [name] [name] 06/21/2022 3:30 PM Medical Record Number: [id number] Patient Account Number: [id number] Date of Birth/Sex: Treating RN: 13-Mar-1951 (71 y.o. [name] [name] Primary Care Tyrice Hewitt: [name] [name] Other Clinician: Referring Vermell Madrid: Treating Yehudah Standing/Extender: [name] [name]: 14 Vital Signs Time Taken: 15:15 Temperature (F): 98.2 Height (in): 67 Pulse (bpm): 79 Weight (lbs): 106 Respiratory Rate (breaths/min): 18 Body Mass Index (BMI): 16.6 Blood Pressure (mmHg): 121/80 Reference Range: 80 - 120 mg / dl Electronic Signature(s) Signed: 06/24/2022 5:20:56 PM By: [name] [name] Kitty Entered By: [name] [name] Kitty on 06/21/2022 15:15:59

## 2022-06-28 ENCOUNTER — Ambulatory Visit (INDEPENDENT_AMBULATORY_CARE_PROVIDER_SITE_OTHER): Payer: Medicare HMO | Admitting: Family Medicine

## 2022-06-28 ENCOUNTER — Encounter: Payer: Self-pay | Admitting: Family Medicine

## 2022-06-28 ENCOUNTER — Telehealth: Payer: Self-pay | Admitting: Pharmacist

## 2022-06-28 VITALS — BP 117/73 | HR 44 | Temp 98.2°F | Ht 67.0 in | Wt 103.4 lb

## 2022-06-28 DIAGNOSIS — F419 Anxiety disorder, unspecified: Secondary | ICD-10-CM

## 2022-06-28 DIAGNOSIS — E538 Deficiency of other specified B group vitamins: Secondary | ICD-10-CM | POA: Insufficient documentation

## 2022-06-28 DIAGNOSIS — M159 Polyosteoarthritis, unspecified: Secondary | ICD-10-CM | POA: Diagnosis not present

## 2022-06-28 MED ORDER — DIAZEPAM 5 MG PO TABS: 2.5000 mg | ORAL_TABLET | Freq: Two times a day (BID) | ORAL | 1 refills | Status: DC | PRN

## 2022-06-28 MED ORDER — CYANOCOBALAMIN 1000 MCG/ML IJ SOLN
1000.0000 ug | Freq: Once | INTRAMUSCULAR | Status: AC
  Administered 2022-06-28: 1000 ug via INTRAMUSCULAR

## 2022-06-28 MED ORDER — METHYLPREDNISOLONE ACETATE 80 MG/ML IJ SUSP
80.0000 mg | Freq: Once | INTRAMUSCULAR | Status: AC
  Administered 2022-06-28: 80 mg via INTRAMUSCULAR

## 2022-06-28 NOTE — Progress Notes (Unsigned)
   [name] [name] is a 71 y.o. female who presents today for an office visit.  Assessment/Plan:  Chronic Problems Addressed Today: Anxiety Still not controlled.  We discussed treatment options.  She has been on valium in the past and has done reasonably well with this.  We will restart her Valium 5 mg twice daily as needed.  She will follow-up me in a couple weeks via MyChart.  Osteoarthritis Pain is not controlled.  We will give Depo-Medrol injection today.  She has done well with this in the past.  B12 deficiency B12 injection given today.     Subjective:  HPI:  See A/p for status of chronic conditions.         Objective:  Physical Exam: BP 117/73   Pulse (!) 44   Temp 98.2 F (36.8 C)   Ht '5\' 7"'$  (1.702 m)   Wt 103 lb 6.4 oz (46.9 kg)   SpO2 98%   BMI 16.19 kg/m   Gen: No acute distress, resting comfortably Neuro: Grossly normal, moves all extremities Psych: Normal affect and thought content      Dimitris Shanahan M. Jerline Pain, MD 06/28/2022 3:10 PM

## 2022-06-28 NOTE — Assessment & Plan Note (Signed)
Still not controlled.  We discussed treatment options.  She has been on valium in the past and has done reasonably well with this.  We will restart her Valium 5 mg twice daily as needed.  She will follow-up me in a couple weeks via MyChart.

## 2022-06-28 NOTE — Patient Instructions (Signed)
It was very nice to see you today!  We will start Valium.  We will give you a cortisone shot and a B12 injection today.  Please follow-up with me in a couple weeks limit how you are doing.  Take care, Dr Jerline Pain  PLEASE NOTE:  If you had any lab tests please let us know if you have not heard back within a few days. You may see your results on mychart before we have a chance to review them but we will give you a call once they are reviewed by [place]. If we ordered any referrals today, please let us know if you have not heard from their office within the next week.   Please try these tips to maintain a healthy lifestyle:  Eat at least 3 REAL meals and 1-2 snacks per day.  Aim for no more than 5 hours between eating.  If you eat breakfast, please do so within one hour of getting up.   Each meal should contain half fruits/vegetables, one quarter protein, and one quarter carbs (no bigger than a computer mouse)  Cut down on sweet beverages. This includes juice, soda, and sweet tea.   Drink at least 1 glass of water with each meal and aim for at least 8 glasses per day  Exercise at least 150 minutes every week.

## 2022-06-28 NOTE — Assessment & Plan Note (Signed)
Pain is not controlled.  We will give Depo-Medrol injection today.  She has done well with this in the past.

## 2022-06-28 NOTE — Progress Notes (Addendum)
Chronic Care Management Pharmacy Assistant   Name: [name] [name]  MRN: [id number] DOB: Oct 05, 1951   Reason for Encounter: Medication Coordination Call    Recent office visits:  05/21/2022 [facility] (PCP) Vivi Barrack, MD; no medication changes indicated.  Recent consult visits:  None  Hospital visits:  None since last medication coordination call.  Medications: Outpatient Encounter Medications as of 06/28/2022  Medication Sig   acetaminophen (TYLENOL) 160 MG/5ML solution Take 20.3 mLs (650 mg total) by mouth every 6 (six) hours as needed for mild pain.   Amino Acids-Protein Hydrolys (FEEDING SUPPLEMENT, PRO-STAT 64,) LIQD Take 30 mLs by mouth 3 (three) times daily with meals.   Ascorbic Acid (VITAMIN C PO) Take 1 tablet by mouth daily.   b complex vitamins capsule Take 1 capsule by mouth daily.   carbidopa-levodopa (SINEMET IR) 25-250 MG tablet Take 1 tablet by mouth 4 (four) times daily.   Cholecalciferol (VITAMIN D) 2000 UNITS CAPS Take 2,000 Units by mouth daily.   feeding supplement (ENSURE ENLIVE / ENSURE PLUS) LIQD Take 237 mLs by mouth 3 (three) times daily between meals.   glucosamine-chondroitin 500-400 MG tablet Take 1 tablet by mouth daily.   guaiFENesin-dextromethorphan (ROBITUSSIN DM) 100-10 MG/5ML syrup Take 15 mLs by mouth 2 (two) times daily.   hydrocortisone cream 1 % Apply topically daily.   hydrOXYzine (ATARAX) 10 MG tablet Take 1 tablet (10 mg total) by mouth 3 (three) times daily as needed for anxiety.   leptospermum manuka honey (MEDIHONEY) PSTE paste Apply 1 application. topically daily.   lidocaine (LIDODERM) 5 % Place 1 patch onto the skin daily. Remove & Discard patch within 12 hours or as directed by MD   Multiple Vitamin (MULTIVITAMIN) capsule Take 1 capsule by mouth daily.   Omega-3 Fatty Acids (OMEGA 3 PO) Take 1 tablet by mouth daily.   pantoprazole (PROTONIX) 40 MG tablet Take 1 tablet (40 mg total) by mouth daily.   QUEtiapine (SEROQUEL) 25 MG  tablet Take 1 tablet (25 mg total) by mouth at bedtime. (Patient not taking: Reported on 03/06/2022)   tetrahydrozoline-zinc (VISINE-AC) 0.05-0.25 % ophthalmic solution Place 2 drops into both eyes 4 (four) times daily as needed (allergies).   vitamin B-12 (CYANOCOBALAMIN) 1000 MCG tablet Take 1,000 mcg by mouth daily.   [DISCONTINUED] amantadine (SYMMETREL) 100 MG capsule Take 1 capsule (100 mg total) by mouth 2 (two) times daily. (Patient not taking: Reported on 09/21/2019)   No facility-administered encounter medications on file as of 06/28/2022.   Reviewed chart for medication changes ahead of medication coordination call.  No OVs, Consults, or hospital visits since last care coordination call/Pharmacist visit. (If appropriate, list visit date, provider name)  No medication changes indicated OR if recent visit, treatment plan here.  BP Readings from Last 3 Encounters:  04/17/22 108/78  03/19/22 108/74  03/01/22 104/72    Lab Results  Component Value Date   HGBA1C 5.4 01/19/2022     Patient obtains medications through Vials  90 Days   Last adherence delivery included:  Pantoprazole 40 mg once daily Carbidopa-Levodopa 25-100 mg three times daily  Patient is due for next adherence delivery on: 07/10/2022. Called patient and reviewed medications and coordinated delivery.  This delivery to include: Pantoprazole 40 mg once daily Carbidopa-Levodopa 25-100 mg three times daily Melatonin 3 mg at bedtime   Patient declined the following medications: Hydroxyzine - patient states she is no longer taking this medication  Patient needs refills for Pantoprazole.  Confirmed delivery  date of 07/10/2022, advised patient that pharmacy will contact them the morning of delivery.   Care Gaps: Medicare Annual Wellness: Completed 12/14/2021 Hemoglobin A1C: 5.4% on 01/19/2022 Colonoscopy: Overdue since 01/10/2016 Dexa Scan: Completed Mammogram: Next due on 07/12/2023  Future Appointments   Date Time Provider [place]  06/28/2022  2:20 PM Vivi Barrack, MD [facility]  07/05/2022  3:45 PM Fredirick Maudlin, MD [facility]  09/16/2022  2:00 PM [facility] PHARMACIST [facility]  12/27/2022  3:15 PM [facility] [place]   April D Calhoun, [place] Pharmacist Assistant [phone]

## 2022-06-28 NOTE — Assessment & Plan Note (Signed)
B12 injection given today. 

## 2022-07-02 ENCOUNTER — Encounter (HOSPITAL_BASED_OUTPATIENT_CLINIC_OR_DEPARTMENT_OTHER): Payer: Medicare HMO | Admitting: General Surgery

## 2022-07-02 ENCOUNTER — Other Ambulatory Visit: Payer: Self-pay | Admitting: *Deleted

## 2022-07-02 MED ORDER — PANTOPRAZOLE SODIUM 40 MG PO TBEC: 40.0000 mg | DELAYED_RELEASE_TABLET | Freq: Every day | ORAL | 2 refills | Status: DC

## 2022-07-05 ENCOUNTER — Encounter (HOSPITAL_BASED_OUTPATIENT_CLINIC_OR_DEPARTMENT_OTHER): Payer: Medicare HMO | Attending: General Surgery | Admitting: General Surgery

## 2022-07-05 DIAGNOSIS — K219 Gastro-esophageal reflux disease without esophagitis: Secondary | ICD-10-CM | POA: Insufficient documentation

## 2022-07-05 DIAGNOSIS — G20 Parkinson's disease: Secondary | ICD-10-CM | POA: Insufficient documentation

## 2022-07-05 DIAGNOSIS — L89153 Pressure ulcer of sacral region, stage 3: Secondary | ICD-10-CM | POA: Insufficient documentation

## 2022-07-05 DIAGNOSIS — M1990 Unspecified osteoarthritis, unspecified site: Secondary | ICD-10-CM | POA: Insufficient documentation

## 2022-07-05 NOTE — Progress Notes (Signed)
[name], [name] ([id number]) Visit Report for 07/05/2022 Arrival Information Details Patient Name: Date of Service: [name] [name], [name] [name] 07/05/2022 3:45 PM Medical Record Number: [id number] Patient Account Number: [id number] Date of Birth/Sex: Treating RN: October 12, 1951 (71 y.o. [name] [name], [name] Primary Care Christalynn Boise: Dimas Chyle Other Clinician: Referring Ieasha Boerema: Treating Mayo Faulk/Extender: Randalyn Rhea in Treatment: 16 Visit Information History Since Last Visit All ordered tests and consults were completed: Yes Patient Arrived: [name] Added or deleted any medications: No Arrival Time: 15:47 Any new allergies or adverse reactions: No Accompanied By: self Had a fall or experienced change in No Transfer Assistance: None activities of daily living that may affect Patient Requires Transmission-Based Precautions: No risk of falls: Patient Has Alerts: No Signs or symptoms of abuse/neglect since last visito No Hospitalized since last visit: No Implantable device outside of the clinic excluding No cellular tissue based products placed in the center since last visit: Has Dressing in Place as Prescribed: Yes Pain Present Now: Yes Electronic Signature(s) Signed: 07/05/2022 5:05:59 PM By: Blanche East RN Entered By: Blanche East on 07/05/2022 15:47:36 -------------------------------------------------------------------------------- Encounter Discharge Information Details Patient Name: Date of Service: [name] [name] 07/05/2022 3:45 PM Medical Record Number: [id number] Patient Account Number: [id number] Date of Birth/Sex: Treating RN: [birth year] (71 y.o. [name] [name] Primary Care Delise Simenson: Dimas Chyle Other Clinician: Referring Norris Bodley: Treating Rozell Kettlewell/Extender: Randalyn Rhea in Treatment: 16 Encounter Discharge Information Items Discharge Condition: Stable Ambulatory Status: Walker Discharge Destination: Home Transportation:  Private Auto Accompanied By: son Schedule Follow-up Appointment: Yes Clinical Summary of Care: Electronic Signature(s) Signed: 07/05/2022 5:05:59 PM By: Blanche East RN Entered By: Blanche East on 07/05/2022 16:17:26 -------------------------------------------------------------------------------- Lower Extremity Assessment Details Patient Name: Date of Service: [name] [name], [name] [name] 07/05/2022 3:45 PM Medical Record Number: [id number] Patient Account Number: [id number] Date of Birth/Sex: Treating RN: 03-10-1951 (71 y.o. [name] [name] Primary Care Almena Hokenson: Dimas Chyle Other Clinician: Referring Chaynce Schafer: Treating Bailee Metter/Extender: Randalyn Rhea in Treatment: 16 Electronic Signature(s) Signed: 07/05/2022 5:05:59 PM By: Blanche East RN Entered By: Blanche East on 07/05/2022 15:48:42 -------------------------------------------------------------------------------- Multi Wound Chart Details Patient Name: Date of Service: [name] [name] 07/05/2022 3:45 PM Medical Record Number: [id number] Patient Account Number: [id number] Date of Birth/Sex: Treating RN: [birth year] (71 y.o. F) Primary Care [name] [name]: Dimas Chyle Other Clinician: Referring Naftula Donahue: Treating Aubreyana Saltz/Extender: Randalyn Rhea in Treatment: 16 Vital Signs Height(in): 67 Pulse(bpm): 88 Weight(lbs): 106 Blood Pressure(mmHg): 130/88 Body Mass Index(BMI): 16.6 Temperature(F): 98.36 Respiratory Rate(breaths/min): 16 Photos: [N/A:N/A] Sacrum N/A N/A Wound Location: Pressure Injury N/A N/A Wounding Event: Pressure Ulcer N/A N/A Primary Etiology: Cataracts, Osteoarthritis N/A N/A Comorbid History: 02/17/2022 N/A N/A Date Acquired: 16 N/A N/A Weeks of Treatment: Open N/A N/A Wound Status: No N/A N/A Wound Recurrence: 0.7x0.5x0.6 N/A N/A Measurements L x W x D (cm) 0.275 N/A N/A A (cm) : rea 0.165 N/A N/A Volume (cm) : 95.80% N/A N/A % Reduction in A  rea: 99.20% N/A N/A % Reduction in Volume: 12 Starting Position 1 (o'clock): 12 Ending Position 1 (o'clock): 1.5 Maximum Distance 1 (cm): Yes N/A N/A Undermining: Category/Stage III N/A N/A Classification: Medium N/A N/A Exudate A mount: Purulent N/A N/A Exudate Type: yellow, brown, green N/A N/A Exudate Color: Well defined, not attached N/A N/A Wound Margin: Large (67-100%) N/A N/A Granulation A mount: Red, Pale N/A N/A Granulation Quality: None Present (0%) N/A N/A Necrotic A mount: Fat Layer (Subcutaneous Tissue): Yes N/A N/A Exposed Structures: Fascia: No Tendon: No Muscle: No  Joint: No Bone: No Medium (34-66%) N/A N/A Epithelialization: Chemical Cauterization N/A N/A Procedures Performed: Treatment Notes Electronic Signature(s) Signed: 07/05/2022 4:05:12 PM By: Fredirick Maudlin MD FACS Entered By: Fredirick Maudlin on 07/05/2022 16:05:12 -------------------------------------------------------------------------------- Multi-Disciplinary Care Plan Details Patient Name: Date of Service: [name] [name] 07/05/2022 3:45 PM Medical Record Number: [id number] Patient Account Number: [id number] Date of Birth/Sex: Treating RN: 1951-03-29 (71 y.o. [name] [name] Primary Care Decoda Van: Dimas Chyle Other Clinician: Referring Naftuli Dalsanto: Treating Louisa Favaro/Extender: Randalyn Rhea in Treatment: Victor reviewed with physician Active Inactive Pressure Nursing Diagnoses: Knowledge deficit related to causes and risk factors for pressure ulcer development Knowledge deficit related to management of pressures ulcers Potential for impaired tissue integrity related to pressure, friction, moisture, and shear Goals: Patient/caregiver will verbalize risk factors for pressure ulcer development Date Initiated: 03/12/2022 Date Inactivated: 04/11/2022 Target Resolution Date: 04/12/2022 Goal Status: Met Patient/caregiver will  verbalize understanding of pressure ulcer management Date Initiated: 03/12/2022 Target Resolution Date: 07/11/2022 Goal Status: Active Interventions: Assess: immobility, friction, shearing, incontinence upon admission and as needed Assess offloading mechanisms upon admission and as needed Assess potential for pressure ulcer upon admission and as needed Provide education on pressure ulcers Notes: Electronic Signature(s) Signed: 07/05/2022 5:05:59 PM By: Blanche East RN Entered By: Blanche East on 07/05/2022 15:51:30 -------------------------------------------------------------------------------- Pain Assessment Details Patient Name: Date of Service: [name] [name], [name] [name] 07/05/2022 3:45 PM Medical Record Number: [id number] Patient Account Number: [id number] Date of Birth/Sex: Treating RN: Jul 23, 1951 (71 y.o. [name] [name] Primary Care Alexsis Kathman: Dimas Chyle Other Clinician: Referring Ryott Rafferty: Treating Teyona Nichelson/Extender: Randalyn Rhea in Treatment: 16 Active Problems Location of Pain Severity and Description of Pain Patient Has Paino Yes Site Locations Rate the pain. Current Pain Level: 1 Pain Management and Medication Current Pain Management: Electronic Signature(s) Signed: 07/05/2022 5:05:59 PM By: Blanche East RN Entered By: Blanche East on 07/05/2022 15:48:38 -------------------------------------------------------------------------------- Patient/Caregiver Education Details Patient Name: Date of Service: [name] [name] 9/8/2023andnbsp3:45 PM Medical Record Number: [id number] Patient Account Number: [id number] Date of Birth/Gender: Treating RN: [birth year] (71 y.o. [name] [name] Primary Care Physician: Dimas Chyle Other Clinician: Referring Physician: Treating Physician/Extender: Randalyn Rhea in Treatment: 16 Education Assessment Education Provided To: Patient Education Topics Provided Pressure: Methods:  Explain/Verbal Responses: Reinforcements needed, State content correctly Electronic Signature(s) Signed: 07/05/2022 5:05:59 PM By: Blanche East RN Entered By: Blanche East on 07/05/2022 15:51:41 -------------------------------------------------------------------------------- Wound Assessment Details Patient Name: Date of Service: [name] [name], [name] [name] 07/05/2022 3:45 PM Medical Record Number: [id number] Patient Account Number: [id number] Date of Birth/Sex: Treating RN: [birth year] (72 y.o. [name] [name], [place] Primary Care Francely Craw: Dimas Chyle Other Clinician: Referring Jadaya Sommerfield: Treating Yudith Norlander/Extender: Randalyn Rhea in Treatment: 16 Wound Status Wound Number: 2 Primary Etiology: Pressure Ulcer Wound Location: Sacrum Wound Status: Open Wounding Event: Pressure Injury Comorbid History: Cataracts, Osteoarthritis Date Acquired: 02/17/2022 Weeks Of Treatment: 16 Clustered Wound: No Photos Wound Measurements Length: (cm) 0.7 Width: (cm) 0.5 Depth: (cm) 0.6 Area: (cm) 0.275 Volume: (cm) 0.165 % Reduction in Area: 95.8% % Reduction in Volume: 99.2% Epithelialization: Medium (34-66%) Tunneling: No Undermining: Yes Starting Position (o'clock): 12 Ending Position (o'clock): 12 Maximum Distance: (cm) 1.5 Wound Description Classification: Category/Stage III Wound Margin: Well defined, not attached Exudate Amount: Medium Exudate Type: Purulent Exudate Color: yellow, brown, green Foul Odor After Cleansing: No Slough/Fibrino No Wound Bed Granulation Amount: Large (67-100%) Exposed Structure Granulation Quality: Red, Pale Fascia Exposed: No Necrotic Amount: None Present (0%) Fat Layer (Subcutaneous Tissue)  Exposed: Yes Tendon Exposed: No Muscle Exposed: No Joint Exposed: No Bone Exposed: No Treatment Notes Wound #2 (Sacrum) Cleanser Soap and Water Discharge Instruction: May shower and wash wound with dial antibacterial soap and water prior to  dressing change. Wound Cleanser Discharge Instruction: Cleanse the wound with wound cleanser prior to applying a clean dressing using gauze sponges, not tissue or cotton balls. Peri-Wound Care Skin Prep Discharge Instruction: Use skin prep as directed Topical Primary Dressing Iodoform packing strip 1/4 (in) Discharge Instruction: Lightly pack into tunneling and undermining. Secondary Dressing ALLEVYN Gentle Border, 4x4 (in/in) Discharge Instruction: Apply over primary dressing as directed. Secured With Compression Wrap Compression Stockings Environmental education officer) Signed: 07/05/2022 5:05:59 PM By: Blanche East RN Entered By: Blanche East on 07/05/2022 15:54:50 -------------------------------------------------------------------------------- Vitals Details Patient Name: Date of Service: [name] [name] 07/05/2022 3:45 PM Medical Record Number: [id number] Patient Account Number: [id number] Date of Birth/Sex: Treating RN: 1951-06-12 (71 y.o. [name] [name], [name] Primary Care Harsh Trulock: Dimas Chyle Other Clinician: Referring Jniyah Dantuono: Treating Koralee Wedeking/Extender: Randalyn Rhea in Treatment: 16 Vital Signs Time Taken: 15:35 Temperature (F): 98.36 Height (in): 67 Pulse (bpm): 88 Weight (lbs): 106 Respiratory Rate (breaths/min): 16 Body Mass Index (BMI): 16.6 Blood Pressure (mmHg): 130/88 Reference Range: 80 - 120 mg / dl Electronic Signature(s) Signed: 07/05/2022 5:05:59 PM By: Blanche East RN Entered By: Blanche East on 07/05/2022 15:48:05

## 2022-07-05 NOTE — Progress Notes (Signed)
[name] [name] ([id number]) Visit Report for 07/05/2022 Chief Complaint Document Details Patient Name: Date of Service: [name] [name] 07/05/2022 3:45 PM Medical Record Number: [id number] Patient Account Number: [id number] Date of Birth/Sex: Treating RN: October 21, 1951 (71 y.o. F) Primary Care Provider: Dimas Chyle Other Clinician: Referring Provider: Treating Provider/Extender: Randalyn Rhea in Treatment: 16 Information Obtained from: Patient Chief Complaint The patient presents to the wound care center today for evaluation of a stage III sacral pressure ulcer as well as a dehisced surgical wound from an emergent femoral hernia repair. Electronic Signature(s) Signed: 07/05/2022 4:05:18 PM By: Fredirick Maudlin MD FACS Entered By: Fredirick Maudlin on 07/05/2022 16:05:18 -------------------------------------------------------------------------------- HPI Details Patient Name: Date of Service: [name] [name] 07/05/2022 3:45 PM Medical Record Number: [id number] Patient Account Number: [id number] Date of Birth/Sex: Treating RN: 12/10/1950 (71 y.o. F) Primary Care Provider: Dimas Chyle Other Clinician: Referring Provider: Treating Provider/Extender: Randalyn Rhea in Treatment: 16 History of Present Illness HPI Description: ADMISSION 03/12/2022 This is a 71 year old woman who underwent an emergency femoral hernia repair secondary to strangulated small bowel. She required a bowel resection and reanastomosis. While she was in the hospital, the wound dehisced and she was initially felt to have an enterocutaneous fistula. They were managing the effluent with an ostomy bag. She saw her general surgeon on May 11. At that time, there was no evidence of EC fistula and the wound was closing in nicely. This is currently being managed with silver alginate. Also during her hospitalization, she unfortunately developed a pressure ulcer. This is currently  stage III. She is in a skilled nursing facility at the moment but will be discharged on Sunday. This wound is being managed with Dakin's wet to dry dressing changes. She is not a diabetic, nor does she smoke. Her only real medical history is Parkinson's disease and fairly significant protein-calorie malnutrition. 04/04/2022: The inguinal wound is closed. The sacral wound is a little bit smaller today. It is clean without significant slough or odor. It continues to undermine for about 4 to 5 cm in the cranial direction. 04/11/2022: The orifice of her sacral wound continues to contract. She still has extensive undermining present. The wound is clean without slough or odor. She did not receive a wound VAC yet because her insurance has a contract with a different provider than the one we usually use. 04/18/2022: The wound is contracting but still has a deep area that is undermined. Apparently the cost for the wound VAC that she would be responsible for is prohibitive. She currently has Medicaid pending. The wound surface is pink and healthy-looking. There is just a little bit of slough and biofilm on the surface. 04/25/2022: The patient and her family decided they wanted to go ahead with wound VAC application. It has been delivered to their home, but they did not bring it with them because they understood that home health would initiate the therapy. Today, the wound surface looks clean and viable with good granulation tissue, but there is a musty odor and blue-green drainage on her dressing, consistent with Pseudomonas aeruginosa. 05/02/2022: For unclear reasons, the patient was told that the levofloxacin that I prescribed was a sulfa derived antibiotic and she did not take it. It does not make sense and I am not sure how that happened, but I reiterated to the patient that it is not a sulfa derived antibiotic and she can go ahead and take the medication. The wound VAC was initiated by home health  on Monday.  Unfortunately, it appears that they are not placing the foam all the way up into the undermined portion of the wound, as there is just a small button of foam identified when the wound VAC was removed today. There is some slough accumulation, as one might expect, within the wound bed. The periwound skin is intact. 05/17/2022: The wound VAC malfunctioned and was removed yesterday. On inspection, there was still a piece of sponge left in situ. It was removed and was a tiny piece of white sponge, clearly inadequate to fill the extensive undermining at the cranial portion of the wound. There has been no improvement in the undermining. She has hypertrophic granulation tissue at the orifice of the wound. Periwound skin is in good condition. 05/24/2022: The undermining has improved quite a bit since switching to just black foam; it only extends from 9-12 o'clock instead of circumferentially. The underlying wound surface has healthy-looking granulation tissue. No malodor or drainage. 06/07/2022: The wound orifice is quite small and the tunneling has come in substantially. No concern for infection. 06/21/2022: The patient was ill last week and could not attend clinic. Today, the tunneling/undermining has come in quite a bit. No purulent drainage or malodor. No concern for infection. 07/05/2022: The undermining has not really come in at all since our last visit. The wound orifice is about the same size. I do see hypertrophic granulation tissue protruding on the wound surface. Electronic Signature(s) Signed: 07/05/2022 4:06:03 PM By: Fredirick Maudlin MD FACS Entered By: Fredirick Maudlin on 07/05/2022 16:06:02 -------------------------------------------------------------------------------- Chemical Cauterization Details Patient Name: Date of Service: [name] [name] 07/05/2022 3:45 PM Medical Record Number: [id number] Patient Account Number: [id number] Date of Birth/Sex: Treating RN: [birth year] (71 y.o. Marta Lamas Primary Care Provider: Dimas Chyle Other Clinician: Referring Provider: Treating Provider/Extender: Randalyn Rhea in Treatment: 16 Procedure Performed for: Wound #2 Sacrum Performed By: Physician Fredirick Maudlin, MD Post Procedure Diagnosis Same as Pre-procedure Notes Scribed for Dr. Celine Ahr by Blanche East, RN Electronic Signature(s) Signed: 07/05/2022 4:51:12 PM By: Fredirick Maudlin MD FACS Signed: 07/05/2022 5:05:59 PM By: Blanche East RN Entered By: Blanche East on 07/05/2022 93:81:01 -------------------------------------------------------------------------------- Physical Exam Details Patient Name: Date of Service: [name] [name], [name] [name] 07/05/2022 3:45 PM Medical Record Number: [id number] Patient Account Number: [id number] Date of Birth/Sex: Treating RN: 11-Apr-1951 (71 y.o. F) Primary Care Provider: Dimas Chyle Other Clinician: Referring Provider: Treating Provider/Extender: Randalyn Rhea in Treatment: 16 Constitutional . . . . No acute distress.Marland Kitchen Respiratory Normal work of breathing on room air.. Notes 07/05/2022: The undermining has not really come in at all since our last visit. The wound orifice is about the same size. I do see hypertrophic granulation tissue protruding on the wound surface. Electronic Signature(s) Signed: 07/05/2022 4:06:33 PM By: Fredirick Maudlin MD FACS Entered By: Fredirick Maudlin on 07/05/2022 16:06:33 -------------------------------------------------------------------------------- Physician Orders Details Patient Name: Date of Service: [name] [name] 07/05/2022 3:45 PM Medical Record Number: [id number] Patient Account Number: [id number] Date of Birth/Sex: Treating RN: December 31, 1950 (71 y.o. Marta Lamas Primary Care Provider: Dimas Chyle Other Clinician: Referring Provider: Treating Provider/Extender: Randalyn Rhea in Treatment: [phone] Verbal / Phone Orders: No Diagnosis  Coding ICD-10 Coding Code Description L89.153 Pressure ulcer of sacral region, stage 3 G20 Parkinson's disease Follow-up Appointments ppointment in 1 week. - Dr. Celine Ahr - Room 4 Return A Monday 07/15/2022 3:15pm Other: - Try and increase protein intake-Premier Protein Shakes are 30g Protein,goal is 100g a day ***PATIENT  TO CALL WOUND VAC COMPANY FOR THEM TO PICK UP.*** Anesthetic Wound #2 Sacrum (In clinic) Topical Lidocaine 5% applied to wound bed - in clinic, prior to debridement Bathing/ Shower/ Hygiene May shower with protection but do not get wound dressing(s) wet. Negative Presssure Wound Therapy Wound #2 Sacrum Discontinue wound vac Off-Loading Low air-loss mattress (Group 2) Turn and reposition every 2 hours Home Health No change in wound care orders this week; continue Home Health for wound care. May utilize formulary equivalent dressing for wound treatment orders unless otherwise specified. - iodoform packing strips 1/4in. home health to continue to change. Dressing changes to be completed by [place] on Monday / Wednesday / Friday except when patient has scheduled visit at [facility]. Other Home Health Orders/Instructions: [facility] Wound Treatment Wound #2 - Sacrum Cleanser: Soap and Water Every Other Day/30 Days Discharge Instructions: May shower and wash wound with dial antibacterial soap and water prior to dressing change. Cleanser: Wound Cleanser Every Other Day/30 Days Discharge Instructions: Cleanse the wound with wound cleanser prior to applying a clean dressing using gauze sponges, not tissue or cotton balls. Peri-Wound Care: Skin Prep Every Other Day/30 Days Discharge Instructions: Use skin prep as directed Prim Dressing: Iodoform packing strip 1/4 (in) [facility]) Every Other Day/30 Days ary Discharge Instructions: Lightly pack into tunneling and undermining. Secondary Dressing: ALLEVYN Gentle Border, 4x4 (in/in) Every Other Day/30  Days Discharge Instructions: Apply over primary dressing as directed. Electronic Signature(s) Unsigned Previous Signature: 07/05/2022 4:51:12 PM Version By: Fredirick Maudlin MD FACS Entered By: Blanche East on 07/05/2022 17:07:36 -------------------------------------------------------------------------------- Problem List Details Patient Name: Date of Service: [name] [name], [name] [name] 07/05/2022 3:45 PM Medical Record Number: [id number] Patient Account Number: [id number] Date of Birth/Sex: Treating RN: 08-Jul-1951 (71 y.o. F) Primary Care Provider: Dimas Chyle Other Clinician: Referring Provider: Treating Provider/Extender: Randalyn Rhea in Treatment: 16 Active Problems ICD-10 Encounter Code Description Active Date MDM Diagnosis L89.153 Pressure ulcer of sacral region, stage 3 03/12/2022 No Yes G20 Parkinson's disease 03/12/2022 No Yes Inactive Problems ICD-10 Code Description Active Date Inactive Date T81.31XA Disruption of external operation (surgical) wound, not elsewhere classified, initial 03/12/2022 03/12/2022 encounter Resolved Problems Electronic Signature(s) Signed: 07/05/2022 4:05:07 PM By: Fredirick Maudlin MD FACS Entered By: Fredirick Maudlin on 07/05/2022 16:05:07 -------------------------------------------------------------------------------- Progress Note Details Patient Name: Date of Service: [name] [name] 07/05/2022 3:45 PM Medical Record Number: [id number] Patient Account Number: [id number] Date of Birth/Sex: Treating RN: 26-Jul-1951 (71 y.o. F) Primary Care Provider: Dimas Chyle Other Clinician: Referring Provider: Treating Provider/Extender: Randalyn Rhea in Treatment: 16 Subjective Chief Complaint Information obtained from Patient The patient presents to the wound care center today for evaluation of a stage III sacral pressure ulcer as well as a dehisced surgical wound from an emergent femoral hernia  repair. History of Present Illness (HPI) ADMISSION 03/12/2022 This is a 71 year old woman who underwent an emergency femoral hernia repair secondary to strangulated small bowel. She required a bowel resection and reanastomosis. While she was in the hospital, the wound dehisced and she was initially felt to have an enterocutaneous fistula. They were managing the effluent with an ostomy bag. She saw her general surgeon on May 11. At that time, there was no evidence of EC fistula and the wound was closing in nicely. This is currently being managed with silver alginate. Also during her hospitalization, she unfortunately developed a pressure ulcer. This is currently stage III. She is in a skilled nursing facility at the moment  but will be discharged on Sunday. This wound is being managed with Dakin's wet to dry dressing changes. She is not a diabetic, nor does she smoke. Her only real medical history is Parkinson's disease and fairly significant protein-calorie malnutrition. 04/04/2022: The inguinal wound is closed. The sacral wound is a little bit smaller today. It is clean without significant slough or odor. It continues to undermine for about 4 to 5 cm in the cranial direction. 04/11/2022: The orifice of her sacral wound continues to contract. She still has extensive undermining present. The wound is clean without slough or odor. She did not receive a wound VAC yet because her insurance has a contract with a different provider than the one we usually use. 04/18/2022: The wound is contracting but still has a deep area that is undermined. Apparently the cost for the wound VAC that she would be responsible for is prohibitive. She currently has Medicaid pending. The wound surface is pink and healthy-looking. There is just a little bit of slough and biofilm on the surface. 04/25/2022: The patient and her family decided they wanted to go ahead with wound VAC application. It has been delivered to their home, but  they did not bring it with them because they understood that home health would initiate the therapy. Today, the wound surface looks clean and viable with good granulation tissue, but there is a musty odor and blue-green drainage on her dressing, consistent with Pseudomonas aeruginosa. 05/02/2022: For unclear reasons, the patient was told that the levofloxacin that I prescribed was a sulfa derived antibiotic and she did not take it. It does not make sense and I am not sure how that happened, but I reiterated to the patient that it is not a sulfa derived antibiotic and she can go ahead and take the medication. The wound VAC was initiated by home health on Monday. Unfortunately, it appears that they are not placing the foam all the way up into the undermined portion of the wound, as there is just a small button of foam identified when the wound VAC was removed today. There is some slough accumulation, as one might expect, within the wound bed. The periwound skin is intact. 05/17/2022: The wound VAC malfunctioned and was removed yesterday. On inspection, there was still a piece of sponge left in situ. It was removed and was a tiny piece of white sponge, clearly inadequate to fill the extensive undermining at the cranial portion of the wound. There has been no improvement in the undermining. She has hypertrophic granulation tissue at the orifice of the wound. Periwound skin is in good condition. 05/24/2022: The undermining has improved quite a bit since switching to just black foam; it only extends from 9-12 o'clock instead of circumferentially. The underlying wound surface has healthy-looking granulation tissue. No malodor or drainage. 06/07/2022: The wound orifice is quite small and the tunneling has come in substantially. No concern for infection. 06/21/2022: The patient was ill last week and could not attend clinic. Today, the tunneling/undermining has come in quite a bit. No purulent drainage or malodor. No  concern for infection. 07/05/2022: The undermining has not really come in at all since our last visit. The wound orifice is about the same size. I do see hypertrophic granulation tissue protruding on the wound surface. Patient History Information obtained from Patient, Caregiver. Family History Cancer - Mother,Father,Siblings, Hypertension - Siblings,Child. Social History Never smoker, Marital Status - Divorced, Alcohol Use - Never, Drug Use - No History, Caffeine Use - Moderate -  Coffee, soda. Medical History Eyes Patient has history of Cataracts Musculoskeletal Patient has history of Osteoarthritis Medical A Surgical History Notes nd Gastrointestinal GERD Musculoskeletal degenerative disc disease Objective Constitutional No acute distress.. Vitals Time Taken: 3:35 PM, Height: 67 in, Weight: 106 lbs, BMI: 16.6, Temperature: 98.36 F, Pulse: 88 bpm, Respiratory Rate: 16 breaths/min, Blood Pressure: 130/88 mmHg. Respiratory Normal work of breathing on room air.. General Notes: 07/05/2022: The undermining has not really come in at all since our last visit. The wound orifice is about the same size. I do see hypertrophic granulation tissue protruding on the wound surface. Integumentary (Hair, Skin) Wound #2 status is Open. Original cause of wound was Pressure Injury. The date acquired was: 02/17/2022. The wound has been in treatment 16 weeks. The wound is located on the Sacrum. The wound measures 0.7cm length x 0.5cm width x 0.6cm depth; 0.275cm^2 area and 0.165cm^3 volume. There is Fat Layer (Subcutaneous Tissue) exposed. There is no tunneling noted, however, there is undermining starting at 12:00 and ending at 12:00 with a maximum distance of 1.5cm. There is a medium amount of purulent drainage noted. The wound margin is well defined and not attached to the wound base. There is large (67-100%) red, pale granulation within the wound bed. There is no necrotic tissue within the wound  bed. Assessment Active Problems ICD-10 Pressure ulcer of sacral region, stage 3 Parkinson's disease Procedures Wound #2 Pre-procedure diagnosis of Wound #2 is a Pressure Ulcer located on the Sacrum . An Chemical Cauterization procedure was performed by Fredirick Maudlin, MD. Post procedure Diagnosis Wound #2: Same as Pre-Procedure Notes: Scribed for Dr. Celine Ahr by Blanche East, RN Plan Follow-up Appointments: Return Appointment in 1 week. - Dr. Celine Ahr - Room 4 Monday 07/15/2022 3:15pm Other: - Try and increase protein intake-Premier Protein Shakes are 30g Protein,goal is 100g a day ***PATIENT TO CALL WOUND VAC COMPANY FOR THEM TO PICK UP.*** Bathing/ Shower/ Hygiene: May shower with protection but do not get wound dressing(s) wet. Negative Presssure Wound Therapy: Wound #2 Sacrum: Discontinue wound vac Off-Loading: Low air-loss mattress (Group 2) Turn and reposition every 2 hours Home Health: No change in wound care orders this week; continue Home Health for wound care. May utilize formulary equivalent dressing for wound treatment orders unless otherwise specified. - iodoform packing strips 1/4in. home health to continue to change. Dressing changes to be completed by [place] on Monday / Wednesday / Friday except when patient has scheduled visit at [facility]. Other Home Health Orders/Instructions: [facility] WOUND #2: - Sacrum Wound Laterality: Cleanser: Soap and Water Every Other Day/30 Days Discharge Instructions: May shower and wash wound with dial antibacterial soap and water prior to dressing change. Cleanser: Wound Cleanser Every Other Day/30 Days Discharge Instructions: Cleanse the wound with wound cleanser prior to applying a clean dressing using gauze sponges, not tissue or cotton balls. Peri-Wound Care: Skin Prep Every Other Day/30 Days Discharge Instructions: Use skin prep as directed Prim Dressing: Iodoform packing strip 1/4 (in) [facility]) Every Other  Day/30 Days ary Discharge Instructions: Lightly pack into tunneling and undermining. Secondary Dressing: ALLEVYN Gentle Border, 4x4 (in/in) Every Other Day/30 Days Discharge Instructions: Apply over primary dressing as directed. 07/05/2022: The undermining has not really come in at all since our last visit. The wound orifice is about the same size. I do see hypertrophic granulation tissue protruding on the wound surface. I used silver nitrate to chemically cauterize the hypertrophic granulation tissue. We will continue to pack the wound  with iodoform packing strips. Follow-up in 2 weeks. Electronic Signature(s) Signed: 07/05/2022 4:33:45 PM By: Fredirick Maudlin MD FACS Entered By: Fredirick Maudlin on 07/05/2022 16:33:45 -------------------------------------------------------------------------------- HxROS Details Patient Name: Date of Service: [name] [name] 07/05/2022 3:45 PM Medical Record Number: [id number] Patient Account Number: [id number] Date of Birth/Sex: Treating RN: [birth year] (71 y.o. F) Primary Care Provider: Dimas Chyle Other Clinician: Referring Provider: Treating Provider/Extender: Randalyn Rhea in Treatment: 16 Information Obtained From Patient Caregiver Eyes Medical History: Positive for: Cataracts Gastrointestinal Medical History: Past Medical History Notes: GERD Musculoskeletal Medical History: Positive for: Osteoarthritis Past Medical History Notes: degenerative disc disease HBO Extended History Items Eyes: Cataracts Immunizations Pneumococcal Vaccine: Received Pneumococcal Vaccination: Yes Received Pneumococcal Vaccination On or After 60th Birthday: No Implantable Devices None Family and Social History Cancer: Yes - Mother,Father,Siblings; Hypertension: Yes - Siblings,Child; Never smoker; Marital Status - Divorced; Alcohol Use: Never; Drug Use: No History; Caffeine Use: Moderate - Coffee, soda; Financial Concerns: No; Food,  Clothing or Shelter Needs: No; Support System Lacking: No; Transportation Concerns: No Electronic Signature(s) Signed: 07/05/2022 4:51:12 PM By: Fredirick Maudlin MD FACS Entered By: Fredirick Maudlin on 07/05/2022 16:06:07 -------------------------------------------------------------------------------- SuperBill Details Patient Name: Date of Service: [name] [name] 07/05/2022 Medical Record Number: [id number] Patient Account Number: [id number] Date of Birth/Sex: Treating RN: May 19, 1951 (71 y.o. F) Primary Care Provider: Dimas Chyle Other Clinician: Referring Provider: Treating Provider/Extender: Randalyn Rhea in Treatment: 16 Diagnosis Coding ICD-10 Codes Code Description L89.153 Pressure ulcer of sacral region, stage 3 G20 Parkinson's disease Facility Procedures CPT4 Code: [id number] Description: 02542 - CHEM CAUT GRANULATION TISS ICD-10 Diagnosis Description L89.153 Pressure ulcer of sacral region, stage 3 Modifier: Quantity: 1 Physician Procedures : CPT4 Code Description Modifier [id number] - WC PHYS LEVEL 3 - EST PT ICD-10 Diagnosis Description L89.153 Pressure ulcer of sacral region, stage 3 G20 Parkinson's disease Quantity: 1 : [id number] - WC PHYS CHEM CAUT GRAN TISSUE ICD-10 Diagnosis Description L89.153 Pressure ulcer of sacral region, stage 3 Quantity: 1 Electronic Signature(s) Signed: 07/05/2022 4:34:05 PM By: Fredirick Maudlin MD FACS Entered By: Fredirick Maudlin on 07/05/2022 16:34:04

## 2022-07-15 ENCOUNTER — Encounter (HOSPITAL_BASED_OUTPATIENT_CLINIC_OR_DEPARTMENT_OTHER): Payer: Medicare HMO | Admitting: General Surgery

## 2022-07-15 DIAGNOSIS — G20 Parkinson's disease: Secondary | ICD-10-CM | POA: Diagnosis not present

## 2022-07-15 DIAGNOSIS — L89153 Pressure ulcer of sacral region, stage 3: Secondary | ICD-10-CM | POA: Diagnosis not present

## 2022-07-15 DIAGNOSIS — K219 Gastro-esophageal reflux disease without esophagitis: Secondary | ICD-10-CM | POA: Diagnosis not present

## 2022-07-15 DIAGNOSIS — M1990 Unspecified osteoarthritis, unspecified site: Secondary | ICD-10-CM | POA: Diagnosis not present

## 2022-07-15 NOTE — Progress Notes (Signed)
GREENLY, RARICK ([id number]) Visit Report for 07/15/2022 Chief Complaint Document Details Patient Name: Date of Service: KAITLAN, BIN 07/15/2022 3:15 PM Medical Record Number: [id number] Patient Account Number: [id number] Date of Birth/Sex: Treating RN: 1951-09-27 (71 y.o. F) Primary Care Provider: Dimas Chyle Other Clinician: Referring Provider: Treating Provider/Extender: Randalyn Rhea in Treatment: 17 Information Obtained from: Patient Chief Complaint The patient presents to the wound care center today for evaluation of a stage III sacral pressure ulcer as well as a dehisced surgical wound from an emergent femoral hernia repair. Electronic Signature(s) Signed: 07/15/2022 4:28:38 PM By: Fredirick Maudlin MD FACS Entered By: Fredirick Maudlin on 07/15/2022 16:28:37 -------------------------------------------------------------------------------- HPI Details Patient Name: Date of Service: [name] 07/15/2022 3:15 PM Medical Record Number: [id number] Patient Account Number: [id number] Date of Birth/Sex: Treating RN: [birth year] (71 y.o. F) Primary Care Provider: Dimas Chyle Other Clinician: Referring Provider: Treating Provider/Extender: Randalyn Rhea in Treatment: 17 History of Present Illness HPI Description: ADMISSION 03/12/2022 This is a 71 year old woman who underwent an emergency femoral hernia repair secondary to strangulated small bowel. She required a bowel resection and reanastomosis. While she was in the hospital, the wound dehisced and she was initially felt to have an enterocutaneous fistula. They were managing the effluent with an ostomy bag. She saw her general surgeon on May 11. At that time, there was no evidence of EC fistula and the wound was closing in nicely. This is currently being managed with silver alginate. Also during her hospitalization, she unfortunately developed a pressure ulcer. This is currently  stage III. She is in a skilled nursing facility at the moment but will be discharged on Sunday. This wound is being managed with Dakin's wet to dry dressing changes. She is not a diabetic, nor does she smoke. Her only real medical history is Parkinson's disease and fairly significant protein-calorie malnutrition. 04/04/2022: The inguinal wound is closed. The sacral wound is a little bit smaller today. It is clean without significant slough or odor. It continues to undermine for about 4 to 5 cm in the cranial direction. 04/11/2022: The orifice of her sacral wound continues to contract. She still has extensive undermining present. The wound is clean without slough or odor. She did not receive a wound VAC yet because her insurance has a contract with a different provider than the one we usually use. 04/18/2022: The wound is contracting but still has a deep area that is undermined. Apparently the cost for the wound VAC that she would be responsible for is prohibitive. She currently has Medicaid pending. The wound surface is pink and healthy-looking. There is just a little bit of slough and biofilm on the surface. 04/25/2022: The patient and her family decided they wanted to go ahead with wound VAC application. It has been delivered to their home, but they did not bring it with them because they understood that home health would initiate the therapy. Today, the wound surface looks clean and viable with good granulation tissue, but there is a musty odor and blue-green drainage on her dressing, consistent with Pseudomonas aeruginosa. 05/02/2022: For unclear reasons, the patient was told that the levofloxacin that I prescribed was a sulfa derived antibiotic and she did not take it. It does not make sense and I am not sure how that happened, but I reiterated to the patient that it is not a sulfa derived antibiotic and she can go ahead and take the medication. The wound VAC was initiated by home health  on Monday.  Unfortunately, it appears that they are not placing the foam all the way up into the undermined portion of the wound, as there is just a small button of foam identified when the wound VAC was removed today. There is some slough accumulation, as one might expect, within the wound bed. The periwound skin is intact. 05/17/2022: The wound VAC malfunctioned and was removed yesterday. On inspection, there was still a piece of sponge left in situ. It was removed and was a tiny piece of white sponge, clearly inadequate to fill the extensive undermining at the cranial portion of the wound. There has been no improvement in the undermining. She has hypertrophic granulation tissue at the orifice of the wound. Periwound skin is in good condition. 05/24/2022: The undermining has improved quite a bit since switching to just black foam; it only extends from 9-12 o'clock instead of circumferentially. The underlying wound surface has healthy-looking granulation tissue. No malodor or drainage. 06/07/2022: The wound orifice is quite small and the tunneling has come in substantially. No concern for infection. 06/21/2022: The patient was ill last week and could not attend clinic. Today, the tunneling/undermining has come in quite a bit. No purulent drainage or malodor. No concern for infection. 07/05/2022: The undermining has not really come in at all since our last visit. The wound orifice is about the same size. I do see hypertrophic granulation tissue protruding on the wound surface. 07/15/2022: The tunnel has contracted quite a bit since our last visit. She continues to accumulate hypertrophic granulation tissue. Electronic Signature(s) Signed: 07/15/2022 4:29:11 PM By: Fredirick Maudlin MD FACS Entered By: Fredirick Maudlin on 07/15/2022 16:29:11 -------------------------------------------------------------------------------- Chemical Cauterization Details Patient Name: Date of Service: [name] 07/15/2022 3:15  PM Medical Record Number: [id number] Patient Account Number: [id number] Date of Birth/Sex: Treating RN: 1951-08-02 (71 y.o. Marta Lamas Primary Care Provider: Dimas Chyle Other Clinician: Referring Provider: Treating Provider/Extender: Randalyn Rhea in Treatment: 17 Procedure Performed for: Wound #2 Sacrum Performed By: Physician Fredirick Maudlin, MD Post Procedure Diagnosis Same as Pre-procedure Notes Scribed for Dr. Celine Ahr by Blanche East, RN Electronic Signature(s) Signed: 07/15/2022 5:19:27 PM By: Fredirick Maudlin MD FACS Signed: 07/15/2022 5:27:19 PM By: Blanche East RN Entered By: Blanche East on 07/15/2022 16:09:32 -------------------------------------------------------------------------------- Physical Exam Details Patient Name: Date of Service: ERIKO, ECONOMOS 07/15/2022 3:15 PM Medical Record Number: [id number] Patient Account Number: [id number] Date of Birth/Sex: Treating RN: 02-09-1951 (71 y.o. F) Primary Care Provider: Dimas Chyle Other Clinician: Referring Provider: Treating Provider/Extender: Randalyn Rhea in Treatment: 17 Constitutional . . . . No acute distress.Marland Kitchen Respiratory Normal work of breathing on room air.. Notes 07/15/2022: The tunnel has contracted quite a bit since our last visit. She continues to accumulate hypertrophic granulation tissue. Electronic Signature(s) Signed: 07/15/2022 4:29:33 PM By: Fredirick Maudlin MD FACS Entered By: Fredirick Maudlin on 07/15/2022 16:29:33 -------------------------------------------------------------------------------- Physician Orders Details Patient Name: Date of Service: [name] 07/15/2022 3:15 PM Medical Record Number: [id number] Patient Account Number: [id number] Date of Birth/Sex: Treating RN: Aug 01, 1951 (72 y.o. Marta Lamas Primary Care Provider: Dimas Chyle Other Clinician: Referring Provider: Treating Provider/Extender: Randalyn Rhea in Treatment: [phone] Verbal / Phone Orders: No Diagnosis Coding ICD-10 Coding Code Description L89.153 Pressure ulcer of sacral region, stage 3 G20 Parkinson's disease Follow-up Appointments ppointment in 1 week. - Dr. Celine Ahr - Room 4 Return A Monday 07/29/2022 3:15pm Anesthetic Wound #2 Sacrum (In clinic) Topical Lidocaine 5% applied to wound  bed - in clinic, prior to debridement Bathing/ Shower/ Hygiene May shower with protection but do not get wound dressing(s) wet. Off-Loading Low air-loss mattress (Group 2) Turn and reposition every 2 hours Home Health No change in wound care orders this week; continue Home Health for wound care. May utilize formulary equivalent dressing for wound treatment orders unless otherwise specified. - iodoform packing strips 1/4in. home health to continue to change. Dressing changes to be completed by [facility] on Monday / Wednesday / Friday except when patient has scheduled visit at [facility]. Other Home Health Orders/Instructions: [facility] Wound Treatment Wound #2 - Sacrum Cleanser: Soap and Water Every Other Day/30 Days Discharge Instructions: May shower and wash wound with dial antibacterial soap and water prior to dressing change. Cleanser: Wound Cleanser Every Other Day/30 Days Discharge Instructions: Cleanse the wound with wound cleanser prior to applying a clean dressing using gauze sponges, not tissue or cotton balls. Peri-Wound Care: Skin Prep Every Other Day/30 Days Discharge Instructions: Use skin prep as directed Prim Dressing: Iodoform packing strip 1/4 (in) [facility]) Every Other Day/30 Days ary Discharge Instructions: Lightly pack into tunneling and undermining. Secondary Dressing: ALLEVYN Gentle Border, 4x4 (in/in) Every Other Day/30 Days Discharge Instructions: Apply over primary dressing as directed. Electronic Signature(s) Signed: 07/15/2022 5:19:27 PM By: Fredirick Maudlin MD  FACS Entered By: Fredirick Maudlin on 07/15/2022 16:29:43 -------------------------------------------------------------------------------- Problem List Details Patient Name: Date of Service: [name] 07/15/2022 3:15 PM Medical Record Number: [id number] Patient Account Number: [id number] Date of Birth/Sex: Treating RN: [birth year] (71 y.o. F) Primary Care Provider: Dimas Chyle Other Clinician: Referring Provider: Treating Provider/Extender: Randalyn Rhea in Treatment: 17 Active Problems ICD-10 Encounter Code Description Active Date MDM Diagnosis L89.153 Pressure ulcer of sacral region, stage 3 03/12/2022 No Yes G20 Parkinson's disease 03/12/2022 No Yes Inactive Problems ICD-10 Code Description Active Date Inactive Date T81.31XA Disruption of external operation (surgical) wound, not elsewhere classified, initial 03/12/2022 03/12/2022 encounter Resolved Problems Electronic Signature(s) Signed: 07/15/2022 4:28:26 PM By: Fredirick Maudlin MD FACS Entered By: Fredirick Maudlin on 07/15/2022 16:28:26 -------------------------------------------------------------------------------- Progress Note Details Patient Name: Date of Service: [name] 07/15/2022 3:15 PM Medical Record Number: [id number] Patient Account Number: [id number] Date of Birth/Sex: Treating RN: 29-Jan-1951 (71 y.o. F) Primary Care Provider: Dimas Chyle Other Clinician: Referring Provider: Treating Provider/Extender: Randalyn Rhea in Treatment: 17 Subjective Chief Complaint Information obtained from Patient The patient presents to the wound care center today for evaluation of a stage III sacral pressure ulcer as well as a dehisced surgical wound from an emergent femoral hernia repair. History of Present Illness (HPI) ADMISSION 03/12/2022 This is a 71 year old woman who underwent an emergency femoral hernia repair secondary to strangulated small bowel. She  required a bowel resection and reanastomosis. While she was in the hospital, the wound dehisced and she was initially felt to have an enterocutaneous fistula. They were managing the effluent with an ostomy bag. She saw her general surgeon on May 11. At that time, there was no evidence of EC fistula and the wound was closing in nicely. This is currently being managed with silver alginate. Also during her hospitalization, she unfortunately developed a pressure ulcer. This is currently stage III. She is in a skilled nursing facility at the moment but will be discharged on Sunday. This wound is being managed with Dakin's wet to dry dressing changes. She is not a diabetic, nor does she smoke. Her only real medical history is Parkinson's disease  and fairly significant protein-calorie malnutrition. 04/04/2022: The inguinal wound is closed. The sacral wound is a little bit smaller today. It is clean without significant slough or odor. It continues to undermine for about 4 to 5 cm in the cranial direction. 04/11/2022: The orifice of her sacral wound continues to contract. She still has extensive undermining present. The wound is clean without slough or odor. She did not receive a wound VAC yet because her insurance has a contract with a different provider than the one we usually use. 04/18/2022: The wound is contracting but still has a deep area that is undermined. Apparently the cost for the wound VAC that she would be responsible for is prohibitive. She currently has Medicaid pending. The wound surface is pink and healthy-looking. There is just a little bit of slough and biofilm on the surface. 04/25/2022: The patient and her family decided they wanted to go ahead with wound VAC application. It has been delivered to their home, but they did not bring it with them because they understood that home health would initiate the therapy. Today, the wound surface looks clean and viable with good granulation tissue, but  there is a musty odor and blue-green drainage on her dressing, consistent with Pseudomonas aeruginosa. 05/02/2022: For unclear reasons, the patient was told that the levofloxacin that I prescribed was a sulfa derived antibiotic and she did not take it. It does not make sense and I am not sure how that happened, but I reiterated to the patient that it is not a sulfa derived antibiotic and she can go ahead and take the medication. The wound VAC was initiated by home health on Monday. Unfortunately, it appears that they are not placing the foam all the way up into the undermined portion of the wound, as there is just a small button of foam identified when the wound VAC was removed today. There is some slough accumulation, as one might expect, within the wound bed. The periwound skin is intact. 05/17/2022: The wound VAC malfunctioned and was removed yesterday. On inspection, there was still a piece of sponge left in situ. It was removed and was a tiny piece of white sponge, clearly inadequate to fill the extensive undermining at the cranial portion of the wound. There has been no improvement in the undermining. She has hypertrophic granulation tissue at the orifice of the wound. Periwound skin is in good condition. 05/24/2022: The undermining has improved quite a bit since switching to just black foam; it only extends from 9-12 o'clock instead of circumferentially. The underlying wound surface has healthy-looking granulation tissue. No malodor or drainage. 06/07/2022: The wound orifice is quite small and the tunneling has come in substantially. No concern for infection. 06/21/2022: The patient was ill last week and could not attend clinic. Today, the tunneling/undermining has come in quite a bit. No purulent drainage or malodor. No concern for infection. 07/05/2022: The undermining has not really come in at all since our last visit. The wound orifice is about the same size. I do see hypertrophic granulation  tissue protruding on the wound surface. 07/15/2022: The tunnel has contracted quite a bit since our last visit. She continues to accumulate hypertrophic granulation tissue. Patient History Information obtained from Patient, Caregiver. Family History Cancer - Mother,Father,Siblings, Hypertension - Siblings,Child. Social History Never smoker, Marital Status - Divorced, Alcohol Use - Never, Drug Use - No History, Caffeine Use - Moderate - Coffee, soda. Medical History Eyes Patient has history of Cataracts Musculoskeletal Patient has history of  Osteoarthritis Medical A Surgical History Notes nd Gastrointestinal GERD Musculoskeletal degenerative disc disease Objective Constitutional No acute distress.. Vitals Time Taken: 3:41 PM, Height: 67 in, Weight: 106 lbs, BMI: 16.6, Temperature: 97.9 F, Pulse: 76 bpm, Respiratory Rate: 16 breaths/min, Blood Pressure: 116/78 mmHg. Respiratory Normal work of breathing on room air.. General Notes: 07/15/2022: The tunnel has contracted quite a bit since our last visit. She continues to accumulate hypertrophic granulation tissue. Integumentary (Hair, Skin) Wound #2 status is Open. Original cause of wound was Pressure Injury. The date acquired was: 02/17/2022. The wound has been in treatment 17 weeks. The wound is located on the Sacrum. The wound measures 1.4cm length x 1.4cm width x 0.6cm depth; 1.539cm^2 area and 0.924cm^3 volume. There is Fat Layer (Subcutaneous Tissue) exposed. There is no undermining noted, however, there is tunneling at 12:00 with a maximum distance of 0.6cm. There is a medium amount of purulent drainage noted. The wound margin is well defined and not attached to the wound base. There is large (67-100%) red, pale granulation within the wound bed. There is a small (1-33%) amount of necrotic tissue within the wound bed including Adherent Slough. Assessment Active Problems ICD-10 Pressure ulcer of sacral region, stage 3 Parkinson's  disease Procedures Wound #2 Pre-procedure diagnosis of Wound #2 is a Pressure Ulcer located on the Sacrum . An Chemical Cauterization procedure was performed by Fredirick Maudlin, MD. Post procedure Diagnosis Wound #2: Same as Pre-Procedure Notes: Scribed for Dr. Celine Ahr by Blanche East, RN Plan Follow-up Appointments: Return Appointment in 1 week. - Dr. Celine Ahr - Room 4 Monday 07/29/2022 3:15pm Anesthetic: Wound #2 Sacrum: (In clinic) Topical Lidocaine 5% applied to wound bed - in clinic, prior to debridement Bathing/ Shower/ Hygiene: May shower with protection but do not get wound dressing(s) wet. Off-Loading: Low air-loss mattress (Group 2) Turn and reposition every 2 hours Home Health: No change in wound care orders this week; continue Home Health for wound care. May utilize formulary equivalent dressing for wound treatment orders unless otherwise specified. - iodoform packing strips 1/4in. home health to continue to change. Dressing changes to be completed by [place] on Monday / Wednesday / Friday except when patient has scheduled visit at [facility]. Other Home Health Orders/Instructions: [facility] WOUND #2: - Sacrum Wound Laterality: Cleanser: Soap and Water Every Other Day/30 Days Discharge Instructions: May shower and wash wound with dial antibacterial soap and water prior to dressing change. Cleanser: Wound Cleanser Every Other Day/30 Days Discharge Instructions: Cleanse the wound with wound cleanser prior to applying a clean dressing using gauze sponges, not tissue or cotton balls. Peri-Wound Care: Skin Prep Every Other Day/30 Days Discharge Instructions: Use skin prep as directed Prim Dressing: Iodoform packing strip 1/4 (in) [facility]) Every Other Day/30 Days ary Discharge Instructions: Lightly pack into tunneling and undermining. Secondary Dressing: ALLEVYN Gentle Border, 4x4 (in/in) Every Other Day/30 Days Discharge Instructions: Apply over primary dressing  as directed. 07/15/2022: The tunnel has contracted quite a bit since our last visit. She continues to accumulate hypertrophic granulation tissue. No debridement was necessary. I used silver nitrate to chemically cauterize the hypertrophic granulation tissue. We will continue to pack the wound with iodoform packing strips. Follow-up in 2 weeks. Electronic Signature(s) Signed: 07/15/2022 4:30:13 PM By: Fredirick Maudlin MD FACS Entered By: Fredirick Maudlin on 07/15/2022 16:30:13 -------------------------------------------------------------------------------- HxROS Details Patient Name: Date of Service: [name] 07/15/2022 3:15 PM Medical Record Number: [id number] Patient Account Number: [id number] Date of Birth/Sex: Treating RN: 09-10-1951 (71  y.o. F) Primary Care Provider: Dimas Chyle Other Clinician: Referring Provider: Treating Provider/Extender: Randalyn Rhea in Treatment: 17 Information Obtained From Patient Caregiver Eyes Medical History: Positive for: Cataracts Gastrointestinal Medical History: Past Medical History Notes: GERD Musculoskeletal Medical History: Positive for: Osteoarthritis Past Medical History Notes: degenerative disc disease HBO Extended History Items Eyes: Cataracts Immunizations Pneumococcal Vaccine: Received Pneumococcal Vaccination: Yes Received Pneumococcal Vaccination On or After 60th Birthday: No Implantable Devices None Family and Social History Cancer: Yes - Mother,Father,Siblings; Hypertension: Yes - Siblings,Child; Never smoker; Marital Status - Divorced; Alcohol Use: Never; Drug Use: No History; Caffeine Use: Moderate - Coffee, soda; Financial Concerns: No; Food, Clothing or Shelter Needs: No; Support System Lacking: No; Transportation Concerns: No Electronic Signature(s) Signed: 07/15/2022 5:19:27 PM By: Fredirick Maudlin MD FACS Entered By: Fredirick Maudlin on 07/15/2022  16:29:15 -------------------------------------------------------------------------------- SuperBill Details Patient Name: Date of Service: [name] 07/15/2022 Medical Record Number: [id number] Patient Account Number: [id number] Date of Birth/Sex: Treating RN: 11/22/1950 (71 y.o. F) Primary Care Provider: Dimas Chyle Other Clinician: Referring Provider: Treating Provider/Extender: Randalyn Rhea in Treatment: 17 Diagnosis Coding ICD-10 Codes Code Description L89.153 Pressure ulcer of sacral region, stage 3 G20 Parkinson's disease Facility Procedures CPT4 Code: [id number] Description: [id number] - CHEM CAUT GRANULATION TISS ICD-10 Diagnosis Description L89.153 Pressure ulcer of sacral region, stage 3 Modifier: Quantity: 1 Physician Procedures : CPT4 Code Description Modifier [id number] - WC PHYS LEVEL 4 - EST PT ICD-10 Diagnosis Description L89.153 Pressure ulcer of sacral region, stage 3 G20 Parkinson's disease Quantity: 1 : [id number] - WC PHYS CHEM CAUT GRAN TISSUE ICD-10 Diagnosis Description L89.153 Pressure ulcer of sacral region, stage 3 Quantity: 1 Electronic Signature(s) Signed: 07/15/2022 4:30:31 PM By: Fredirick Maudlin MD FACS Entered By: Fredirick Maudlin on 07/15/2022 16:30:31

## 2022-07-15 NOTE — Progress Notes (Signed)
0.924 % Reduction in Area: 76.7% % Reduction in Volume: 95.6% Epithelialization: Medium (34-66%) Tunneling: Yes Position (o'clock): 12 Maximum Distance: (cm) 0.6 Undermining: No Wound Description Classification: Category/Stage  III Wound Margin: Well defined, not attached Exudate Amount: Medium Exudate Type: Purulent Exudate Color: yellow, brown, green Foul Odor After Cleansing: No Slough/Fibrino Yes Wound Bed Granulation Amount: Large (67-100%) Exposed Structure Granulation Quality: Red, Pale Fascia Exposed: No Necrotic Amount: Small (1-33%) Fat Layer (Subcutaneous Tissue) Exposed: Yes Necrotic Quality: Adherent Slough Tendon Exposed: No Muscle Exposed: No Joint Exposed: No Bone Exposed: No Treatment Notes Wound #2 (Sacrum) Cleanser Soap and Water Discharge Instruction: May shower and wash wound with dial antibacterial soap and water prior to dressing change. Wound Cleanser Discharge Instruction: Cleanse the wound with wound cleanser prior to applying a clean dressing using gauze sponges, not tissue or cotton balls. Peri-Wound Care Skin Prep Discharge Instruction: Use skin prep as directed Topical Primary Dressing Iodoform packing strip 1/4 (in) Discharge Instruction: Lightly pack into tunneling and undermining. Secondary Dressing ALLEVYN Gentle Border, 4x4 (in/in) Discharge Instruction: Apply over primary dressing as directed. Secured With Compression Wrap Compression Stockings Environmental education officer) Signed: 07/15/2022 5:27:19 PM By: Blanche East RN Entered By: Blanche East on 07/15/2022 15:50:22 -------------------------------------------------------------------------------- Vitals Details Patient Name: Date of Service: [name] [name] 07/15/2022 3:15 PM Medical Record Number: [id number] Patient Account Number: [id number] Date of Birth/Sex: Treating RN: May 22, 1951 (71 y.o. [name] [name], [name] [name] Primary Care : Dimas Chyle Other Clinician: Referring : Treating /Extender: Randalyn Rhea in Treatment: 17 Vital Signs Time Taken: 15:41 Temperature (F): 97.9 Height (in): 67 Pulse (bpm): 76 Weight (lbs): 106 Respiratory Rate  (breaths/min): 16 Body Mass Index (BMI): 16.6 Blood Pressure (mmHg): 116/78 Reference Range: 80 - 120 mg / dl Electronic Signature(s) Signed: 07/15/2022 5:27:19 PM By: Blanche East RN Entered By: Blanche East on 07/15/2022 15:41:41  0.924 % Reduction in Area: 76.7% % Reduction in Volume: 95.6% Epithelialization: Medium (34-66%) Tunneling: Yes Position (o'clock): 12 Maximum Distance: (cm) 0.6 Undermining: No Wound Description Classification: Category/Stage  III Wound Margin: Well defined, not attached Exudate Amount: Medium Exudate Type: Purulent Exudate Color: yellow, brown, green Foul Odor After Cleansing: No Slough/Fibrino Yes Wound Bed Granulation Amount: Large (67-100%) Exposed Structure Granulation Quality: Red, Pale Fascia Exposed: No Necrotic Amount: Small (1-33%) Fat Layer (Subcutaneous Tissue) Exposed: Yes Necrotic Quality: Adherent Slough Tendon Exposed: No Muscle Exposed: No Joint Exposed: No Bone Exposed: No Treatment Notes Wound #2 (Sacrum) Cleanser Soap and Water Discharge Instruction: May shower and wash wound with dial antibacterial soap and water prior to dressing change. Wound Cleanser Discharge Instruction: Cleanse the wound with wound cleanser prior to applying a clean dressing using gauze sponges, not tissue or cotton balls. Peri-Wound Care Skin Prep Discharge Instruction: Use skin prep as directed Topical Primary Dressing Iodoform packing strip 1/4 (in) Discharge Instruction: Lightly pack into tunneling and undermining. Secondary Dressing ALLEVYN Gentle Border, 4x4 (in/in) Discharge Instruction: Apply over primary dressing as directed. Secured With Compression Wrap Compression Stockings Environmental education officer) Signed: 07/15/2022 5:27:19 PM By: Blanche East RN Entered By: Blanche East on 07/15/2022 15:50:22 -------------------------------------------------------------------------------- Vitals Details Patient Name: Date of Service: [name] [name] 07/15/2022 3:15 PM Medical Record Number: [id number] Patient Account Number: [id number] Date of Birth/Sex: Treating RN: May 22, 1951 (71 y.o. [name] [name], [name] [name] Primary Care : Dimas Chyle Other Clinician: Referring : Treating /Extender: Randalyn Rhea in Treatment: 17 Vital Signs Time Taken: 15:41 Temperature (F): 97.9 Height (in): 67 Pulse (bpm): 76 Weight (lbs): 106 Respiratory Rate  (breaths/min): 16 Body Mass Index (BMI): 16.6 Blood Pressure (mmHg): 116/78 Reference Range: 80 - 120 mg / dl Electronic Signature(s) Signed: 07/15/2022 5:27:19 PM By: Blanche East RN Entered By: Blanche East on 07/15/2022 15:41:41  ALEGRA, ROST ([id number]) Visit Report for 07/15/2022 Arrival Information Details Patient Name: Date of Service: [name] [name], [name] [name] 07/15/2022 3:15 PM Medical Record Number: [id number] Patient Account Number: [id number] Date of Birth/Sex: Treating RN: [birth year] (71 y.o. [name] [name], [name] Primary Care : Dimas Chyle Other Clinician: Referring : Treating /Extender: Randalyn Rhea in Treatment: 74 Visit Information History Since Last Visit All ordered tests and consults were completed: Yes Patient Arrived: [name] Added or deleted any medications: No Arrival Time: 15:36 Any new allergies or adverse reactions: No Accompanied By: daughter Had a fall or experienced change in No Transfer Assistance: None activities of daily living that may affect Patient Requires Transmission-Based Precautions: No risk of falls: Patient Has Alerts: No Signs or symptoms of abuse/neglect since last visito No Hospitalized since last visit: No Implantable device outside of the clinic excluding No cellular tissue based products placed in the center since last visit: Has Dressing in Place as Prescribed: Yes Pain Present Now: No Electronic Signature(s) Signed: 07/15/2022 5:27:19 PM By: Blanche East RN Entered By: Blanche East on 07/15/2022 15:41:19 -------------------------------------------------------------------------------- Encounter Discharge Information Details Patient Name: Date of Service: [name] [name] 07/15/2022 3:15 PM Medical Record Number: [id number] Patient Account Number: [id number] Date of Birth/Sex: Treating RN: [birth year] (71 y.o. Marta Lamas Primary Care : Dimas Chyle Other Clinician: Referring : Treating /Extender: Randalyn Rhea in Treatment: 17 Encounter Discharge Information Items Discharge Condition: Stable Ambulatory Status: Walker Discharge Destination:  Home Transportation: Private Auto Accompanied By: daughter Schedule Follow-up Appointment: Yes Clinical Summary of Care: Electronic Signature(s) Signed: 07/15/2022 5:27:19 PM By: Blanche East RN Entered By: Blanche East on 07/15/2022 16:21:53 -------------------------------------------------------------------------------- Lower Extremity Assessment Details Patient Name: Date of Service: TENESHA, GARZA 07/15/2022 3:15 PM Medical Record Number: [id number] Patient Account Number: [id number] Date of Birth/Sex: Treating RN: [birth year] (71 y.o. Marta Lamas Primary Care : Dimas Chyle Other Clinician: Referring : Treating /Extender: Randalyn Rhea in Treatment: 17 Electronic Signature(s) Signed: 07/15/2022 5:27:19 PM By: Blanche East RN Entered By: Blanche East on 07/15/2022 15:41:54 -------------------------------------------------------------------------------- Multi Wound Chart Details Patient Name: Date of Service: [name] [name] 07/15/2022 3:15 PM Medical Record Number: [id number] Patient Account Number: [id number] Date of Birth/Sex: Treating RN: 06/04/1951 (71 y.o. F) Primary Care : Dimas Chyle Other Clinician: Referring : Treating /Extender: Randalyn Rhea in Treatment: 17 Vital Signs Height(in): 67 Pulse(bpm): 76 Weight(lbs): 106 Blood Pressure(mmHg): 116/78 Body Mass Index(BMI): 16.6 Temperature(F): 97.9 Respiratory Rate(breaths/min): 16 Photos: [N/A:N/A] Sacrum N/A N/A Wound Location: Pressure Injury N/A N/A Wounding Event: Pressure Ulcer N/A N/A Primary Etiology: Cataracts, Osteoarthritis N/A N/A Comorbid History: 02/17/2022 N/A N/A Date Acquired: 17 N/A N/A Weeks of Treatment: Open N/A N/A Wound Status: No N/A N/A Wound Recurrence: 1.4x1.4x0.6 N/A N/A Measurements L x W x D (cm) 1.539 N/A N/A A (cm) : rea 0.924 N/A N/A Volume (cm)  : 76.70% N/A N/A % Reduction in A rea: 95.60% N/A N/A % Reduction in Volume: 12 Position 1 (o'clock): 0.6 Maximum Distance 1 (cm): Yes N/A N/A Tunneling: Category/Stage III N/A N/A Classification: Medium N/A N/A Exudate A mount: Purulent N/A N/A Exudate Type: yellow, brown, green N/A N/A Exudate Color: Well defined, not attached N/A N/A Wound Margin: Large (67-100%) N/A N/A Granulation A mount: Red, Pale N/A N/A Granulation Quality: Small (1-33%) N/A N/A Necrotic A mount: Fat Layer (Subcutaneous Tissue): Yes N/A N/A Exposed Structures: Fascia: No Tendon: No Muscle: No Joint: No Bone: No Medium (34-66%) N/A

## 2022-07-16 DIAGNOSIS — G20 Parkinson's disease: Secondary | ICD-10-CM | POA: Diagnosis not present

## 2022-07-16 DIAGNOSIS — L89154 Pressure ulcer of sacral region, stage 4: Secondary | ICD-10-CM | POA: Diagnosis not present

## 2022-07-16 DIAGNOSIS — K632 Fistula of intestine: Secondary | ICD-10-CM | POA: Diagnosis not present

## 2022-07-16 DIAGNOSIS — J9601 Acute respiratory failure with hypoxia: Secondary | ICD-10-CM | POA: Diagnosis not present

## 2022-07-17 ENCOUNTER — Telehealth: Payer: Self-pay | Admitting: Family Medicine

## 2022-07-17 DIAGNOSIS — M5030 Other cervical disc degeneration, unspecified cervical region: Secondary | ICD-10-CM | POA: Diagnosis not present

## 2022-07-17 DIAGNOSIS — Z79899 Other long term (current) drug therapy: Secondary | ICD-10-CM

## 2022-07-17 DIAGNOSIS — M8580 Other specified disorders of bone density and structure, unspecified site: Secondary | ICD-10-CM | POA: Diagnosis not present

## 2022-07-17 DIAGNOSIS — F411 Generalized anxiety disorder: Secondary | ICD-10-CM | POA: Diagnosis not present

## 2022-07-17 DIAGNOSIS — L89154 Pressure ulcer of sacral region, stage 4: Secondary | ICD-10-CM | POA: Diagnosis not present

## 2022-07-17 DIAGNOSIS — E43 Unspecified severe protein-calorie malnutrition: Secondary | ICD-10-CM | POA: Diagnosis not present

## 2022-07-17 DIAGNOSIS — K7689 Other specified diseases of liver: Secondary | ICD-10-CM

## 2022-07-17 DIAGNOSIS — K5909 Other constipation: Secondary | ICD-10-CM | POA: Diagnosis not present

## 2022-07-17 DIAGNOSIS — G20 Parkinson's disease: Secondary | ICD-10-CM | POA: Diagnosis not present

## 2022-07-17 DIAGNOSIS — D508 Other iron deficiency anemias: Secondary | ICD-10-CM | POA: Diagnosis not present

## 2022-07-17 DIAGNOSIS — J302 Other seasonal allergic rhinitis: Secondary | ICD-10-CM

## 2022-07-17 DIAGNOSIS — M1990 Unspecified osteoarthritis, unspecified site: Secondary | ICD-10-CM | POA: Diagnosis not present

## 2022-07-17 DIAGNOSIS — Z86010 Personal history of colonic polyps: Secondary | ICD-10-CM

## 2022-07-17 DIAGNOSIS — R627 Adult failure to thrive: Secondary | ICD-10-CM

## 2022-07-17 DIAGNOSIS — Z9851 Tubal ligation status: Secondary | ICD-10-CM

## 2022-07-17 DIAGNOSIS — Z9181 History of falling: Secondary | ICD-10-CM

## 2022-07-17 DIAGNOSIS — Z9089 Acquired absence of other organs: Secondary | ICD-10-CM

## 2022-07-17 DIAGNOSIS — R911 Solitary pulmonary nodule: Secondary | ICD-10-CM

## 2022-07-17 DIAGNOSIS — K219 Gastro-esophageal reflux disease without esophagitis: Secondary | ICD-10-CM

## 2022-07-17 DIAGNOSIS — Z8742 Personal history of other diseases of the female genital tract: Secondary | ICD-10-CM

## 2022-07-17 NOTE — Telephone Encounter (Signed)
..  Home Health Certification or Plan of Care Tracking  Is this a Certification or Plan of Care? yes  [place] Agency: [facility]   Order Number:  [id number]  Has charge sheet been attached? yes  Where has form been placed:  In provider's box  Faxed to:   [phone]

## 2022-07-19 NOTE — Progress Notes (Signed)
[name], [name] ([id number]) Visit Report for 05/02/2022 Chief Complaint Document Details Patient Name: Date of Service: [name] [name], [name] [name] 05/02/2022 2:45 PM Medical Record Number: [id number] Patient Account Number: [id number] Date of Birth/Sex: Treating RN: 1951/09/15 (71 y.o. F) Primary Care Provider: Dimas Chyle Other Clinician: Referring Provider: Treating Provider/Extender: Randalyn Rhea in Treatment: 7 Information Obtained from: Patient Chief Complaint The patient presents to the wound care center today for evaluation of a stage III sacral pressure ulcer as well as a dehisced surgical wound from an emergent femoral hernia repair. Electronic Signature(s) Signed: 05/02/2022 3:49:04 PM By: Fredirick Maudlin MD FACS Entered By: Fredirick Maudlin on 05/02/2022 15:49:04 -------------------------------------------------------------------------------- Debridement Details Patient Name: Date of Service: [name] [name] 05/02/2022 2:45 PM Medical Record Number: [id number] Patient Account Number: [id number] Date of Birth/Sex: Treating RN: [birth year] (71 y.o. Donalda Ewings Primary Care Provider: Dimas Chyle Other Clinician: Referring Provider: Treating Provider/Extender: Randalyn Rhea in Treatment: 7 Debridement Performed for Assessment: Wound #2 Sacrum Performed By: Physician Fredirick Maudlin, MD Debridement Type: Debridement Level of Consciousness (Pre-procedure): Awake and Alert Pre-procedure Verification/Time Out Yes - 15:30 Taken: Start Time: 15:33 Pain Control: Lidocaine 4% T opical Solution T Area Debrided (L x W): otal 0.9 (cm) x 1 (cm) = 0.9 (cm) Tissue and other material debrided: Non-Viable, Slough, Subcutaneous, Slough Level: Skin/Subcutaneous Tissue Debridement Description: Excisional Instrument: Curette Bleeding: Minimum Hemostasis Achieved: Pressure Procedural Pain: 0 Post Procedural Pain: 0 Response to Treatment:  Procedure was tolerated well Level of Consciousness (Post- Awake and Alert procedure): Post Debridement Measurements of Total Wound Length: (cm) 0.9 Stage: Category/Stage III Width: (cm) 0.9 Depth: (cm) 1.3 Volume: (cm) 0.827 Character of Wound/Ulcer Post Debridement: Improved Post Procedure Diagnosis Same as Pre-procedure Electronic Signature(s) Signed: 05/02/2022 3:58:15 PM By: Fredirick Maudlin MD FACS Signed: 07/19/2022 10:28:04 AM By: Sharyn Creamer RN, BSN Entered By: Sharyn Creamer on 05/02/2022 15:38:08 -------------------------------------------------------------------------------- HPI Details Patient Name: Date of Service: [name] [name] 05/02/2022 2:45 PM Medical Record Number: [id number] Patient Account Number: [id number] Date of Birth/Sex: Treating RN: [birth year] (71 y.o. F) Primary Care Provider: Dimas Chyle Other Clinician: Referring Provider: Treating Provider/Extender: Randalyn Rhea in Treatment: 7 History of Present Illness HPI Description: ADMISSION 03/12/2022 This is a 71 year old woman who underwent an emergency femoral hernia repair secondary to strangulated small bowel. She required a bowel resection and reanastomosis. While she was in the hospital, the wound dehisced and she was initially felt to have an enterocutaneous fistula. They were managing the effluent with an ostomy bag. She saw her general surgeon on May 11. At that time, there was no evidence of EC fistula and the wound was closing in nicely. This is currently being managed with silver alginate. Also during her hospitalization, she unfortunately developed a pressure ulcer. This is currently stage III. She is in a skilled nursing facility at the moment but will be discharged on Sunday. This wound is being managed with Dakin's wet to dry dressing changes. She is not a diabetic, nor does she smoke. Her only real medical history is Parkinson's disease and fairly significant  protein-calorie malnutrition. 04/04/2022: The inguinal wound is closed. The sacral wound is a little bit smaller today. It is clean without significant slough or odor. It continues to undermine for about 4 to 5 cm in the cranial direction. 04/11/2022: The orifice of her sacral wound continues to contract. She still has extensive undermining present. The wound is clean without slough or odor. She did not receive  a wound VAC yet because her insurance has a contract with a different provider than the one we usually use. 04/18/2022: The wound is contracting but still has a deep area that is undermined. Apparently the cost for the wound VAC that she would be responsible for is prohibitive. She currently has Medicaid pending. The wound surface is pink and healthy-looking. There is just a little bit of slough and biofilm on the surface. 04/25/2022: The patient and her family decided they wanted to go ahead with wound VAC application. It has been delivered to their home, but they did not bring it with them because they understood that home health would initiate the therapy. Today, the wound surface looks clean and viable with good granulation tissue, but there is a musty odor and blue-green drainage on her dressing, consistent with Pseudomonas aeruginosa. 623: For unclear reasons, the patient was told that the levofloxacin that I prescribed was a sulfa derived antibiotic and she did not take it. It does not make sense and I am not sure how that happened, but I reiterated to the patient that it is not a sulfa derived antibiotic and she can go ahead and take the medication. The wound VAC was initiated by home health on Monday. Unfortunately, it appears that they are not placing the foam all the way up into the undermined portion of the wound, as there is just a small button of foam identified when the wound VAC was removed today. There is some slough accumulation, as one might expect, within the wound bed. The  periwound skin is intact. Electronic Signature(s) Signed: 05/02/2022 3:50:35 PM By: Fredirick Maudlin MD FACS Entered By: Fredirick Maudlin on 05/02/2022 15:50:35 -------------------------------------------------------------------------------- Physical Exam Details Patient Name: Date of Service: [name] [name] 05/02/2022 2:45 PM Medical Record Number: [id number] Patient Account Number: [id number] Date of Birth/Sex: Treating RN: 1951-09-12 (71 y.o. F) Primary Care Provider: Dimas Chyle Other Clinician: Referring Provider: Treating Provider/Extender: Randalyn Rhea in Treatment: 7 Constitutional . . . . No acute distress.Marland Kitchen Respiratory Normal work of breathing on room air.. Notes 05/02/2022: There is some slough accumulation in the wound, but no obvious purulent drainage. The periwound skin is in good condition. Electronic Signature(s) Signed: 05/02/2022 3:51:57 PM By: Fredirick Maudlin MD FACS Entered By: Fredirick Maudlin on 05/02/2022 15:51:57 -------------------------------------------------------------------------------- Physician Orders Details Patient Name: Date of Service: [name] [name] 05/02/2022 2:45 PM Medical Record Number: [id number] Patient Account Number: [id number] Date of Birth/Sex: Treating RN: 05-20-1951 (71 y.o. Donalda Ewings Primary Care Provider: Dimas Chyle Other Clinician: Referring Provider: Treating Provider/Extender: Randalyn Rhea in Treatment: 7 Verbal / Phone Orders: No Diagnosis Coding ICD-10 Coding Code Description L89.153 Pressure ulcer of sacral region, stage 3 G20 Parkinson's disease Follow-up Appointments ppointment in 1 week. - Dr. Celine Ahr - Room 1- Thursday 7/13 @ 11:15am Return A Other: - Try and increase protein intake-Premier Protein Shakes are 30g Protein,goal is 100g a day Bathing/ Shower/ Hygiene May shower with protection but do not get wound dressing(s) wet. Negative Presssure Wound  Therapy Wound #2 Sacrum Wound Vac to wound continuously at 116m/hg pressure - [facility] to be changed by home health Mon, Wed, Fri Black and White Foam combination - White foam into undermining Off-Loading Low air-loss mattress (Group 2) Turn and reposition every 2 hours Home Health No change in wound care orders this week[place]. May utilize formulary equivalent dressing for wound treatment  orders unless otherwise specified. - home health to apply and change VAC 3 times per week Dressing changes to be completed by [place] on Monday / Wednesday / Friday except when patient has scheduled visit at [facility]. - Be sure to pack white foam to undermining area to fully pack wound, drape healthy periwound skin with tape, may add black foam larger than wound to properly fit vac.use skin prep to ensure tape adheres to skin. Other Home Health Orders/Instructions: [facility] Wound Treatment Wound #2 - Sacrum Cleanser: Soap and Water Every Other Day/15 Days Discharge Instructions: May shower and wash wound with dial antibacterial soap and water prior to dressing change. Cleanser: Wound Cleanser Every Other Day/15 Days Discharge Instructions: Cleanse the wound with wound cleanser prior to applying a clean dressing using gauze sponges, not tissue or cotton balls. Peri-Wound Care: Skin Prep Every Other Day/15 Days Discharge Instructions: Use skin prep as directed Prim Dressing: Dakin's Solution 0.25%, 16 (oz) Every Other Day/15 Days ary Discharge Instructions: Moisten gauze with Dakin's solution and pack lightly into wound in clinic Secondary Dressing: ALLEVYN Gentle Border, 4x4 (in/in) Every Other Day/15 Days Discharge Instructions: Apply over primary dressing as directed. Electronic Signature(s) Signed: 05/02/2022 3:58:15 PM By: Fredirick Maudlin MD FACS Entered By: Fredirick Maudlin on 05/02/2022  15:52:12 -------------------------------------------------------------------------------- Problem List Details Patient Name: Date of Service: [name] [name] 05/02/2022 2:45 PM Medical Record Number: [id number] Patient Account Number: [id number] Date of Birth/Sex: Treating RN: 04-Feb-1951 (71 y.o. F) Primary Care Provider: Dimas Chyle Other Clinician: Referring Provider: Treating Provider/Extender: Randalyn Rhea in Treatment: 7 Active Problems ICD-10 Encounter Code Description Active Date MDM Diagnosis L89.153 Pressure ulcer of sacral region, stage 3 03/12/2022 No Yes G20 Parkinson's disease 03/12/2022 No Yes Inactive Problems ICD-10 Code Description Active Date Inactive Date T81.31XA Disruption of external operation (surgical) wound, not elsewhere classified, initial 03/12/2022 03/12/2022 encounter Resolved Problems Electronic Signature(s) Signed: 05/02/2022 3:48:53 PM By: Fredirick Maudlin MD FACS Entered By: Fredirick Maudlin on 05/02/2022 15:48:53 -------------------------------------------------------------------------------- Progress Note Details Patient Name: Date of Service: [name] [name] 05/02/2022 2:45 PM Medical Record Number: [id number] Patient Account Number: [id number] Date of Birth/Sex: Treating RN: Feb 27, 1951 (71 y.o. F) Primary Care Provider: Dimas Chyle Other Clinician: Referring Provider: Treating Provider/Extender: Randalyn Rhea in Treatment: 7 Subjective Chief Complaint Information obtained from Patient The patient presents to the wound care center today for evaluation of a stage III sacral pressure ulcer as well as a dehisced surgical wound from an emergent femoral hernia repair. History of Present Illness (HPI) ADMISSION 03/12/2022 This is a 71 year old woman who underwent an emergency femoral hernia repair secondary to strangulated small bowel. She required a bowel resection and reanastomosis. While  she was in the hospital, the wound dehisced and she was initially felt to have an enterocutaneous fistula. They were managing the effluent with an ostomy bag. She saw her general surgeon on May 11. At that time, there was no evidence of EC fistula and the wound was closing in nicely. This is currently being managed with silver alginate. Also during her hospitalization, she unfortunately developed a pressure ulcer. This is currently stage III. She is in a skilled nursing facility at the moment but will be discharged on Sunday. This wound is being managed with Dakin's wet to dry dressing changes. She is not a diabetic, nor does she smoke. Her only real medical history is Parkinson's disease and fairly significant protein-calorie malnutrition. 04/04/2022: The inguinal wound is closed. The sacral  wound is a little bit smaller today. It is clean without significant slough or odor. It continues to undermine for about 4 to 5 cm in the cranial direction. 04/11/2022: The orifice of her sacral wound continues to contract. She still has extensive undermining present. The wound is clean without slough or odor. She did not receive a wound VAC yet because her insurance has a contract with a different provider than the one we usually use. 04/18/2022: The wound is contracting but still has a deep area that is undermined. Apparently the cost for the wound VAC that she would be responsible for is prohibitive. She currently has Medicaid pending. The wound surface is pink and healthy-looking. There is just a little bit of slough and biofilm on the surface. 04/25/2022: The patient and her family decided they wanted to go ahead with wound VAC application. It has been delivered to their home, but they did not bring it with them because they understood that home health would initiate the therapy. Today, the wound surface looks clean and viable with good granulation tissue, but there is a musty odor and blue-green drainage on her  dressing, consistent with Pseudomonas aeruginosa. 623: For unclear reasons, the patient was told that the levofloxacin that I prescribed was a sulfa derived antibiotic and she did not take it. It does not make sense and I am not sure how that happened, but I reiterated to the patient that it is not a sulfa derived antibiotic and she can go ahead and take the medication. The wound VAC was initiated by home health on Monday. Unfortunately, it appears that they are not placing the foam all the way up into the undermined portion of the wound, as there is just a small button of foam identified when the wound VAC was removed today. There is some slough accumulation, as one might expect, within the wound bed. The periwound skin is intact. Patient History Information obtained from Patient, Caregiver. Family History Cancer - Mother,Father,Siblings, Hypertension - Siblings,Child. Social History Never smoker, Marital Status - Divorced, Alcohol Use - Never, Drug Use - No History, Caffeine Use - Moderate - Coffee, soda. Medical History Eyes Patient has history of Cataracts Musculoskeletal Patient has history of Osteoarthritis Medical A Surgical History Notes nd Gastrointestinal GERD Musculoskeletal degenerative disc disease Objective Constitutional No acute distress.. Vitals Time Taken: 3:15 PM, Height: 67 in, Weight: 106 lbs, BMI: 16.6, Temperature: 98.1 F, Pulse: 74 bpm, Respiratory Rate: 18 breaths/min, Blood Pressure: 106/71 mmHg. Respiratory Normal work of breathing on room air.. General Notes: 05/02/2022: There is some slough accumulation in the wound, but no obvious purulent drainage. The periwound skin is in good condition. Integumentary (Hair, Skin) Wound #2 status is Open. Original cause of wound was Pressure Injury. The date acquired was: 02/17/2022. The wound has been in treatment 7 weeks. The wound is located on the Sacrum. The wound measures 0.9cm length x 0.9cm width x 1.3cm depth;  0.636cm^2 area and 0.827cm^3 volume. There is Fat Layer (Subcutaneous Tissue) exposed. There is no tunneling noted, however, there is undermining starting at 10:00 and ending at 3:00 with a maximum distance of 2.3cm. There is a medium amount of purulent drainage noted. The wound margin is well defined and not attached to the wound base. There is large (67-100%) red granulation within the wound bed. There is no necrotic tissue within the wound bed. Assessment Active Problems ICD-10 Pressure ulcer of sacral region, stage 3 Parkinson's disease Procedures Wound #2 Pre-procedure diagnosis of Wound #2 is  a Pressure Ulcer located on the Sacrum . There was a Excisional Skin/Subcutaneous Tissue Debridement with a total area of 0.9 sq cm performed by Fredirick Maudlin, MD. With the following instrument(s): Curette to remove Non-Viable tissue/material. Material removed includes Subcutaneous Tissue and Slough and after achieving pain control using Lidocaine 4% T opical Solution. No specimens were taken. A time out was conducted at 15:30, prior to the start of the procedure. A Minimum amount of bleeding was controlled with Pressure. The procedure was tolerated well with a pain level of 0 throughout and a pain level of 0 following the procedure. Post Debridement Measurements: 0.9cm length x 0.9cm width x 1.3cm depth; 0.827cm^3 volume. Post debridement Stage noted as Category/Stage III. Character of Wound/Ulcer Post Debridement is improved. Post procedure Diagnosis Wound #2: Same as Pre-Procedure Plan Follow-up Appointments: Return Appointment in 1 week. - Dr. Celine Ahr - Room 1- Thursday 7/13 @ 11:15am Other: - Try and increase protein intake-Premier Protein Shakes are 30g Protein,goal is 100g a day Bathing/ Shower/ Hygiene: May shower with protection but do not get wound dressing(s) wet. Negative Presssure Wound Therapy: Wound #2 Sacrum: Wound Vac to wound continuously at 147m/hg pressure - [facility] to be changed by home health Mon, Wed, Fri Black and White Foam combination - White foam into undermining Off-Loading: Low air-loss mattress (Group 2) Turn and reposition every 2 hours Home Health: No change in wound care orders this week; continue Home Health for wound care. May utilize formulary equivalent dressing for wound treatment orders unless otherwise specified. - home health to apply and change VAC 3 times per week Dressing [place] [facility] [place] / Wednesday / Friday [facility] - Be sure to pack white foam to undermining area to fully pack wound, drape healthy periwound skin with tape, may add black foam larger than wound to properly fit vac.use skin prep to ensure tape adheres to skin. Other Home Health Orders/Instructions: [facility] [place] [facility]: - Sacrum Wound Laterality: Cleanser: Soap and Water Every Other Day/15 Days Discharge Instructions: May shower and wash wound with dial antibacterial soap and water prior to dressing change. Cleanser: Wound Cleanser Every Other Day/15 Days Discharge Instructions: Cleanse the wound with wound cleanser prior to applying a clean dressing using gauze sponges, not tissue or cotton balls. Peri-Wound Care: Skin Prep Every Other Day/15 Days Discharge Instructions: Use skin prep as directed Prim Dressing: Dakin's Solution 0.25%, 16 (oz) Every Other Day/15 Days ary Discharge Instructions: Moisten gauze with Dakin's solution and pack lightly into wound in clinic Secondary Dressing: ALLEVYN Gentle Border, 4x4 (in/in) Every Other Day/15 Days Discharge Instructions: Apply over primary dressing as directed. 05/02/2022: There is some slough accumulation in the wound, but no obvious purulent drainage. The periwound skin is in good condition. I used a curette to debride the slough and nonviable subcutaneous tissue from the wound. We will  continue with the wound VAC and reiterate to the home health agency that the sponge must be placed into the undermined portion of the wound in order to derive the anticipated benefit. The patient will take the antibiotic that I prescribed for her (levofloxacin) now that she understands that it is not a sulfa antibiotic. Follow-up in 1 week. Electronic Signature(s) Signed: 05/02/2022 3:53:03 PM By: CFredirick MaudlinMD FACS Entered By: CFredirick Maudlinon 05/02/2022 15:53:03 -------------------------------------------------------------------------------- HxROS Details Patient Name: Date of Service: GBeverlee [name] 05/02/2022  2:45 PM Medical Record Number: [id number] Patient Account Number: [id number] Date of Birth/Sex: Treating RN: [birth year] (71 y.o. F) Primary Care Provider: Dimas Chyle Other Clinician: Referring Provider: Treating Provider/Extender: Randalyn Rhea in Treatment: 7 Information Obtained From Patient Caregiver Eyes Medical History: Positive for: Cataracts Gastrointestinal Medical History: Past Medical History Notes: GERD Musculoskeletal Medical History: Positive for: Osteoarthritis Past Medical History Notes: degenerative disc disease HBO Extended History Items Eyes: Cataracts Immunizations Pneumococcal Vaccine: Received Pneumococcal Vaccination: Yes Received Pneumococcal Vaccination On or After 60th Birthday: No Implantable Devices None Family and Social History Cancer: Yes - Mother,Father,Siblings; Hypertension: Yes - Siblings,Child; Never smoker; Marital Status - Divorced; Alcohol Use: Never; Drug Use: No History; Caffeine Use: Moderate - Coffee, soda; Financial Concerns: No; Food, Clothing or Shelter Needs: No; Support System Lacking: No; Transportation Concerns: No Electronic Signature(s) Signed: 05/02/2022 3:58:15 PM By: Fredirick Maudlin MD FACS Entered By: Fredirick Maudlin on 05/02/2022  15:50:56 -------------------------------------------------------------------------------- SuperBill Details Patient Name: Date of Service: [name] [name] 05/02/2022 Medical Record Number: [id number] Patient Account Number: [id number] Date of Birth/Sex: Treating RN: 1951-05-13 (71 y.o. F) Primary Care Provider: Dimas Chyle Other Clinician: Referring Provider: Treating Provider/Extender: Randalyn Rhea in Treatment: 7 Diagnosis Coding ICD-10 Codes Code Description [phone] Pressure ulcer of sacral region, stage 3 G20 Parkinson's disease Facility Procedures CPT4 Code: [id number] Description: 10932 - DEB SUBQ TISSUE 20 SQ CM/< ICD-10 Diagnosis Description L89.153 Pressure ulcer of sacral region, stage 3 Modifier: Quantity: 1 Physician Procedures : CPT4 Code Description Modifier [id number] - WC PHYS LEVEL 3 - EST PT ICD-10 Diagnosis Description L89.153 Pressure ulcer of sacral region, stage 3 G20 Parkinson's disease Quantity: 1 : [id number] - WC PHYS SUBQ TISS 20 SQ CM ICD-10 Diagnosis Description L89.153 Pressure ulcer of sacral region, stage 3 Quantity: 1 Electronic Signature(s) Signed: 05/02/2022 3:54:03 PM By: Fredirick Maudlin MD FACS Entered By: Fredirick Maudlin on 05/02/2022 15:54:03

## 2022-07-19 NOTE — Progress Notes (Signed)
[name] [name], [name] [name] ([id number]) Visit Report for 05/02/2022 Arrival Information Details Patient Name: Date of Service: [name] [name], [name] [name] 05/02/2022 2:45 PM Medical Record Number: [id number] Patient Account Number: [id number] Date of Birth/Sex: Treating RN: 1951-02-23 (71 y.o. [name] [name] Primary Care Shilpa Bushee: Dimas Chyle Other Clinician: Referring Shantal Roan: Treating Alban Marucci/Extender: Randalyn Rhea in Treatment: 7 Visit Information History Since Last Visit Added or deleted any medications: No Patient Arrived: Wheel Chair Any new allergies or adverse reactions: No Arrival Time: 15:14 Had a fall or experienced change in No Accompanied By: daughter activities of daily living that may affect Transfer Assistance: None risk of falls: Patient Identification Verified: Yes Signs or symptoms of abuse/neglect since last visito No Secondary Verification Process Completed: Yes Hospitalized since last visit: No Patient Requires Transmission-Based Precautions: No Implantable device outside of the clinic excluding No Patient Has Alerts: No cellular tissue based products placed in the center since last visit: Has Dressing in Place as Prescribed: Yes Pain Present Now: No Electronic Signature(s) Signed: 07/19/2022 10:28:04 AM By: Sharyn Creamer RN, BSN Entered By: Sharyn Creamer on 05/02/2022 15:15:17 -------------------------------------------------------------------------------- Encounter Discharge Information Details Patient Name: Date of Service: [name] [name] 05/02/2022 2:45 PM Medical Record Number: [id number] Patient Account Number: [id number] Date of Birth/Sex: Treating RN: 12-12-1950 (71 y.o. [name] [name] Primary Care Zaine Elsass: Dimas Chyle Other Clinician: Referring Brenten Janney: Treating Oreste Majeed/Extender: Randalyn Rhea in Treatment: 7 Encounter Discharge Information Items Post Procedure Vitals Discharge Condition:  Stable Temperature (F): 98.1 Ambulatory Status: Wheelchair Pulse (bpm): 74 Discharge Destination: Home Respiratory Rate (breaths/min): 18 Transportation: Private Auto Blood Pressure (mmHg): 106/71 Accompanied By: daughter Schedule Follow-up Appointment: Yes Clinical Summary of Care: Patient Declined Electronic Signature(s) Signed: 07/19/2022 10:28:04 AM By: Sharyn Creamer RN, BSN Entered By: Sharyn Creamer on 05/02/2022 16:20:05 -------------------------------------------------------------------------------- Lower Extremity Assessment Details Patient Name: Date of Service: [name] [name] 05/02/2022 2:45 PM Medical Record Number: [id number] Patient Account Number: [id number] Date of Birth/Sex: Treating RN: 07-11-1951 (71 y.o. [name] [name] Primary Care Trianna Lupien: Dimas Chyle Other Clinician: Referring Gerold Sar: Treating Shronda Boeh/Extender: Randalyn Rhea in Treatment: 7 Electronic Signature(s) Signed: 07/19/2022 10:28:04 AM By: Sharyn Creamer RN, BSN Entered By: Sharyn Creamer on 05/02/2022 15:16:03 -------------------------------------------------------------------------------- Multi Wound Chart Details Patient Name: Date of Service: [name] [name] 05/02/2022 2:45 PM Medical Record Number: [id number] Patient Account Number: [id number] Date of Birth/Sex: Treating RN: 07-Aug-1951 (71 y.o. F) Primary Care Samrat Hayward: Dimas Chyle Other Clinician: Referring Rembert Browe: Treating Ely Ballen/Extender: Randalyn Rhea in Treatment: 7 Vital Signs Height(in): 67 Pulse(bpm): 74 Weight(lbs): 106 Blood Pressure(mmHg): 106/71 Body Mass Index(BMI): 16.6 Temperature(F): 98.1 Respiratory Rate(breaths/min): 18 Photos: [N/A:N/A] Sacrum N/A N/A Wound Location: Pressure Injury N/A N/A Wounding Event: Pressure Ulcer N/A N/A Primary Etiology: Cataracts, Osteoarthritis N/A N/A Comorbid History: 02/17/2022 N/A N/A Date Acquired: 7 N/A  N/A Weeks of Treatment: Open N/A N/A Wound Status: No N/A N/A Wound Recurrence: 0.9x0.9x1.3 N/A N/A Measurements L x W x D (cm) 0.636 N/A N/A A (cm) : rea 0.827 N/A N/A Volume (cm) : 90.40% N/A N/A % Reduction in A rea: 96.10% N/A N/A % Reduction in Volume: 10 Starting Position 1 (o'clock): 3 Ending Position 1 (o'clock): 2.3 Maximum Distance 1 (cm): Yes N/A N/A Undermining: Category/Stage III N/A N/A Classification: Medium N/A N/A Exudate A mount: Purulent N/A N/A Exudate Type: yellow, brown, green N/A N/A Exudate Color: Well defined, not attached N/A N/A Wound Margin: Large (67-100%) N/A N/A Granulation Amount: Red N/A N/A Granulation Quality: None  Present (0%) N/A N/A Necrotic Amount: Fat Layer (Subcutaneous Tissue): Yes N/A N/A Exposed Structures: Fascia: No Tendon: No Muscle: No Joint: No Bone: No None N/A N/A Epithelialization: Debridement - Excisional N/A N/A Debridement: Pre-procedure Verification/Time Out 15:30 N/A N/A Taken: Lidocaine 4% Topical Solution N/A N/A Pain Control: Subcutaneous, Slough N/A N/A Tissue Debrided: Skin/Subcutaneous Tissue N/A N/A Level: 0.9 N/A N/A Debridement A (sq cm): rea Curette N/A N/A Instrument: Minimum N/A N/A Bleeding: Pressure N/A N/A Hemostasis A chieved: 0 N/A N/A Procedural Pain: 0 N/A N/A Post Procedural Pain: Procedure was tolerated well N/A N/A Debridement Treatment Response: 0.9x0.9x1.3 N/A N/A Post Debridement Measurements L x W x D (cm) 0.827 N/A N/A Post Debridement Volume: (cm) Category/Stage III N/A N/A Post Debridement Stage: Debridement N/A N/A Procedures Performed: Treatment Notes Electronic Signature(s) Signed: 05/02/2022 3:48:58 PM By: Fredirick Maudlin MD FACS Entered By: Fredirick Maudlin on 05/02/2022 15:48:57 -------------------------------------------------------------------------------- Multi-Disciplinary Care Plan Details Patient Name: Date of Service: [name] [name] 05/02/2022 2:45 PM Medical Record Number: [id number] Patient Account Number: [id number] Date of Birth/Sex: Treating RN: Mar 23, 1951 (71 y.o. [name] [name] Primary Care Thanya Cegielski: Dimas Chyle Other Clinician: Referring Emersynn Deatley: Treating Roselee Tayloe/Extender: Randalyn Rhea in Treatment: 7 Multidisciplinary Care Plan reviewed with physician Active Inactive Pressure Nursing Diagnoses: Knowledge deficit related to causes and risk factors for pressure ulcer development Knowledge deficit related to management of pressures ulcers Potential for impaired tissue integrity related to pressure, friction, moisture, and shear Goals: Patient/caregiver will verbalize risk factors for pressure ulcer development Date Initiated: 03/12/2022 Date Inactivated: 04/11/2022 Target Resolution Date: 04/12/2022 Goal Status: Met Patient/caregiver will verbalize understanding of pressure ulcer management Date Initiated: 03/12/2022 Target Resolution Date: 05/22/2022 Goal Status: Active Interventions: Assess: immobility, friction, shearing, incontinence upon admission and as needed Assess offloading mechanisms upon admission and as needed Assess potential for pressure ulcer upon admission and as needed Provide education on pressure ulcers Notes: Wound/Skin Impairment Nursing Diagnoses: Impaired tissue integrity Knowledge deficit related to ulceration/compromised skin integrity Goals: Patient/caregiver will verbalize understanding of skin care regimen Date Initiated: 03/12/2022 Target Resolution Date: 05/10/2022 Goal Status: Active Interventions: Assess patient/caregiver ability to obtain necessary supplies Assess patient/caregiver ability to perform ulcer/skin care regimen upon admission and as needed Assess ulceration(s) every visit Provide education on ulcer and skin care Notes: Electronic Signature(s) Signed: 07/19/2022 10:28:04 AM By: Sharyn Creamer RN, BSN Entered By:  Sharyn Creamer on 05/02/2022 16:18:18 -------------------------------------------------------------------------------- Pain Assessment Details Patient Name: Date of Service: [name] [name] 05/02/2022 2:45 PM Medical Record Number: [id number] Patient Account Number: [id number] Date of Birth/Sex: Treating RN: [birth year] (71 y.o. [name] [name] Primary Care Khristen Cheyney: Dimas Chyle Other Clinician: Referring Rakhi Romagnoli: Treating Jaymen Fetch/Extender: Randalyn Rhea in Treatment: 7 Active Problems Location of Pain Severity and Description of Pain Patient Has Paino No Site Locations Pain Management and Medication Current Pain Management: Electronic Signature(s) Signed: 07/19/2022 10:28:04 AM By: Sharyn Creamer RN, BSN Entered By: Sharyn Creamer on 05/02/2022 15:15:50 -------------------------------------------------------------------------------- Patient/Caregiver Education Details Patient Name: Date of Service: [name] [name] 7/6/2023andnbsp2:45 PM Medical Record Number: [id number] Patient Account Number: [id number] Date of Birth/Gender: Treating RN: 1950-12-29 (71 y.o. [name] [name] Primary Care Physician: Dimas Chyle Other Clinician: Referring Physician: Treating Physician/Extender: Randalyn Rhea in Treatment: 7 Education Assessment Education Provided To: Patient Education Topics Provided Wound/Skin Impairment: Methods: Explain/Verbal Responses: State content correctly Electronic Signature(s) Signed: 07/19/2022 10:28:04 AM By: Sharyn Creamer RN, BSN Entered By: Sharyn Creamer on 05/02/2022 16:18:38 -------------------------------------------------------------------------------- Wound Assessment Details Patient Name:  Date of Service: [name] [name], [name] [name] 05/02/2022 2:45 PM Medical Record Number: [id number] Patient Account Number: [id number] Date of Birth/Sex: Treating RN: [birth year] (71 y.o. [name] [name] Primary Care  Janiel Derhammer: Dimas Chyle Other Clinician: Referring Dajae Kizer: Treating Nakai Pollio/Extender: Randalyn Rhea in Treatment: 7 Wound Status Wound Number: 2 Primary Etiology: Pressure Ulcer Wound Location: Sacrum Wound Status: Open Wounding Event: Pressure Injury Comorbid History: Cataracts, Osteoarthritis Date Acquired: 02/17/2022 Weeks Of Treatment: 7 Clustered Wound: No Photos Wound Measurements Length: (cm) 0.9 Width: (cm) 0.9 Depth: (cm) 1.3 Area: (cm) 0.636 Volume: (cm) 0.827 % Reduction in Area: 90.4% % Reduction in Volume: 96.1% Epithelialization: None Tunneling: No Undermining: Yes Starting Position (o'clock): 10 Ending Position (o'clock): 3 Maximum Distance: (cm) 2.3 Wound Description Classification: Category/Stage III Wound Margin: Well defined, not attached Exudate Amount: Medium Exudate Type: Purulent Exudate Color: yellow, brown, green Foul Odor After Cleansing: No Slough/Fibrino No Wound Bed Granulation Amount: Large (67-100%) Exposed Structure Granulation Quality: Red Fascia Exposed: No Necrotic Amount: None Present (0%) Fat Layer (Subcutaneous Tissue) Exposed: Yes Tendon Exposed: No Muscle Exposed: No Joint Exposed: No Bone Exposed: No Electronic Signature(s) Signed: 07/19/2022 10:28:04 AM By: Sharyn Creamer RN, BSN Entered By: Sharyn Creamer on 05/02/2022 15:25:53 -------------------------------------------------------------------------------- Vitals Details Patient Name: Date of Service: [name] [name] 05/02/2022 2:45 PM Medical Record Number: [id number] Patient Account Number: [id number] Date of Birth/Sex: Treating RN: [birth year] (71 y.o. [name] [name] Primary Care Jerline Linzy: Dimas Chyle Other Clinician: Referring Leland Raver: Treating Syniyah Bourne/Extender: Randalyn Rhea in Treatment: 7 Vital Signs Time Taken: 15:15 Temperature (F): 98.1 Height (in): 67 Pulse (bpm): 74 Weight (lbs):  106 Respiratory Rate (breaths/min): 18 Body Mass Index (BMI): 16.6 Blood Pressure (mmHg): 106/71 Reference Range: 80 - 120 mg / dl Electronic Signature(s) Signed: 07/19/2022 10:28:04 AM By: Sharyn Creamer RN, BSN Entered By: Sharyn Creamer on 05/02/2022 15:15:44

## 2022-07-29 ENCOUNTER — Encounter (HOSPITAL_BASED_OUTPATIENT_CLINIC_OR_DEPARTMENT_OTHER): Payer: Medicare HMO | Attending: General Surgery | Admitting: General Surgery

## 2022-07-29 DIAGNOSIS — G20A1 Parkinson's disease without dyskinesia, without mention of fluctuations: Secondary | ICD-10-CM | POA: Diagnosis not present

## 2022-07-29 DIAGNOSIS — L89153 Pressure ulcer of sacral region, stage 3: Secondary | ICD-10-CM | POA: Insufficient documentation

## 2022-07-29 NOTE — Progress Notes (Signed)
[name] [name], [name] [name] ([id number]) Visit Report for 07/29/2022 Chief Complaint Document Details Patient Name: Date of Service: [name] [name], [name] [name] 07/29/2022 3:15 PM Medical Record Number: [id number] Patient Account Number: [id number] Date of Birth/Sex: Treating RN: 05-Feb-1951 (71 y.o. Marta Lamas Primary Care Provider: Dimas Chyle Other Clinician: Referring Provider: Treating Provider/Extender: Randalyn Rhea in Treatment: 19 Information Obtained from: Patient Chief Complaint The patient presents to the wound care center today for evaluation of a stage III sacral pressure ulcer as well as a dehisced surgical wound from an emergent femoral hernia repair. Electronic Signature(s) Signed: 07/29/2022 3:42:52 PM By: Fredirick Maudlin MD FACS Entered By: Fredirick Maudlin on 07/29/2022 15:42:52 -------------------------------------------------------------------------------- HPI Details Patient Name: Date of Service: [name] [name] 07/29/2022 3:15 PM Medical Record Number: [id number] Patient Account Number: [id number] Date of Birth/Sex: Treating RN: 24-Jul-1951 (71 y.o. Marta Lamas Primary Care Provider: Dimas Chyle Other Clinician: Referring Provider: Treating Provider/Extender: Randalyn Rhea in Treatment: 19 History of Present Illness HPI Description: ADMISSION 03/12/2022 This is a 71 year old woman who underwent an emergency femoral hernia repair secondary to strangulated small bowel. She required a bowel resection and reanastomosis. While she was in the hospital, the wound dehisced and she was initially felt to have an enterocutaneous fistula. They were managing the effluent with an ostomy bag. She saw her general surgeon on May 11. At that time, there was no evidence of EC fistula and the wound was closing in nicely. This is currently being managed with silver alginate. Also during her hospitalization, she unfortunately developed a  pressure ulcer. This is currently stage III. She is in a skilled nursing facility at the moment but will be discharged on Sunday. This wound is being managed with Dakin's wet to dry dressing changes. She is not a diabetic, nor does she smoke. Her only real medical history is Parkinson's disease and fairly significant protein-calorie malnutrition. 04/04/2022: The inguinal wound is closed. The sacral wound is a little bit smaller today. It is clean without significant slough or odor. It continues to undermine for about 4 to 5 cm in the cranial direction. 04/11/2022: The orifice of her sacral wound continues to contract. She still has extensive undermining present. The wound is clean without slough or odor. She did not receive a wound VAC yet because her insurance has a contract with a different provider than the one we usually use. 04/18/2022: The wound is contracting but still has a deep area that is undermined. Apparently the cost for the wound VAC that she would be responsible for is prohibitive. She currently has Medicaid pending. The wound surface is pink and healthy-looking. There is just a little bit of slough and biofilm on the surface. 04/25/2022: The patient and her family decided they wanted to go ahead with wound VAC application. It has been delivered to their home, but they did not bring it with them because they understood that home health would initiate the therapy. Today, the wound surface looks clean and viable with good granulation tissue, but there is a musty odor and blue-green drainage on her dressing, consistent with Pseudomonas aeruginosa. 05/02/2022: For unclear reasons, the patient was told that the levofloxacin that I prescribed was a sulfa derived antibiotic and she did not take it. It does not make sense and I am not sure how that happened, but I reiterated to the patient that it is not a sulfa derived antibiotic and she can go ahead and take the medication. The wound VAC was  initiated by home health on Monday. Unfortunately, it appears that they are not placing the foam all the way up into the undermined portion of the wound, as there is just a small button of foam identified when the wound VAC was removed today. There is some slough accumulation, as one might expect, within the wound bed. The periwound skin is intact. 05/17/2022: The wound VAC malfunctioned and was removed yesterday. On inspection, there was still a piece of sponge left in situ. It was removed and was a tiny piece of white sponge, clearly inadequate to fill the extensive undermining at the cranial portion of the wound. There has been no improvement in the undermining. She has hypertrophic granulation tissue at the orifice of the wound. Periwound skin is in good condition. 05/24/2022: The undermining has improved quite a bit since switching to just black foam; it only extends from 9-12 o'clock instead of circumferentially. The underlying wound surface has healthy-looking granulation tissue. No malodor or drainage. 06/07/2022: The wound orifice is quite small and the tunneling has come in substantially. No concern for infection. 06/21/2022: The patient was ill last week and could not attend clinic. Today, the tunneling/undermining has come in quite a bit. No purulent drainage or malodor. No concern for infection. 07/05/2022: The undermining has not really come in at all since our last visit. The wound orifice is about the same size. I do see hypertrophic granulation tissue protruding on the wound surface. 07/15/2022: The tunnel has contracted quite a bit since our last visit. She continues to accumulate hypertrophic granulation tissue. 07/29/2022: The tunnel continues to contract. The wound surface is healthy with good granulation tissue. Electronic Signature(s) Signed: 07/29/2022 3:43:28 PM By: Fredirick Maudlin MD FACS Entered By: Fredirick Maudlin on 07/29/2022  15:43:28 -------------------------------------------------------------------------------- Physical Exam Details Patient Name: Date of Service: [name] [name], [name] [name] 07/29/2022 3:15 PM Medical Record Number: [id number] Patient Account Number: [id number] Date of Birth/Sex: Treating RN: [birth year] (71 y.o. Marta Lamas Primary Care Provider: Dimas Chyle Other Clinician: Referring Provider: Treating Provider/Extender: Randalyn Rhea in Treatment: 19 Constitutional . . . . No acute distress.Marland Kitchen Respiratory Normal work of breathing on room air.. Notes 07/29/2022: The tunnel continues to contract. The wound surface is healthy with good granulation tissue. Electronic Signature(s) Signed: 07/29/2022 3:44:21 PM By: Fredirick Maudlin MD FACS Entered By: Fredirick Maudlin on 07/29/2022 15:44:21 -------------------------------------------------------------------------------- Physician Orders Details Patient Name: Date of Service: [name] [name], [name] [name] 07/29/2022 3:15 PM Medical Record Number: [id number] Patient Account Number: [id number] Date of Birth/Sex: Treating RN: [birth year] (71 y.o. Marta Lamas Primary Care Provider: Dimas Chyle Other Clinician: Referring Provider: Treating Provider/Extender: Randalyn Rhea in Treatment: [phone] Verbal / Phone Orders: No Diagnosis Coding ICD-10 Coding Code Description L89.153 Pressure ulcer of sacral region, stage 3 G20 Parkinson's disease Follow-up Appointments ppointment in 1 week. - Dr. Celine Ahr - Room 4 Return A Anesthetic Wound #2 Sacrum (In clinic) Topical Lidocaine 5% applied to wound bed - in clinic, prior to debridement Bathing/ Shower/ Hygiene May shower with protection but do not get wound dressing(s) wet. Off-Loading Low air-loss mattress (Group 2) Turn and reposition every 2 hours Home Health No change in wound care orders this week; continue Home Health for wound care. May utilize formulary  equivalent dressing for wound treatment orders unless otherwise specified. - iodoform packing strips 1/4in. home health to continue to change. Dressing changes to be completed by Home Health on Monday / Wednesday / Friday except when patient has scheduled visit at  [place]. Other Home Health Orders/Instructions: [facility] Wound Treatment Wound #2 - Sacrum Cleanser: Soap and Water Every Other Day/30 Days Discharge Instructions: May shower and wash wound with dial antibacterial soap and water prior to dressing change. Cleanser: Wound Cleanser Every Other Day/30 Days Discharge Instructions: Cleanse the wound with wound cleanser prior to applying a clean dressing using gauze sponges, not tissue or cotton balls. Peri-Wound Care: Skin Prep Every Other Day/30 Days Discharge Instructions: Use skin prep as directed Prim Dressing: Iodoform packing strip 1/4 (in) [facility]) Every Other Day/30 Days ary Discharge Instructions: Lightly pack into tunneling and undermining. Secondary Dressing: ALLEVYN Gentle Border, 4x4 (in/in) Every Other Day/30 Days Discharge Instructions: Apply over primary dressing as directed. Electronic Signature(s) Signed: 07/29/2022 3:55:52 PM By: Fredirick Maudlin MD FACS Entered By: Fredirick Maudlin on 07/29/2022 15:44:38 -------------------------------------------------------------------------------- Problem List Details Patient Name: Date of Service: [name] [name], [name] [name] 07/29/2022 3:15 PM Medical Record Number: [id number] Patient Account Number: [id number] Date of Birth/Sex: Treating RN: 1950/12/13 (71 y.o. Marta Lamas Primary Care Provider: Dimas Chyle Other Clinician: Referring Provider: Treating Provider/Extender: Randalyn Rhea in Treatment: 19 Active Problems ICD-10 Encounter Code Description Active Date MDM Diagnosis L89.153 Pressure ulcer of sacral region, stage 3 03/12/2022 No Yes G20 Parkinson's disease 03/12/2022 No  Yes Inactive Problems ICD-10 Code Description Active Date Inactive Date T81.31XA Disruption of external operation (surgical) wound, not elsewhere classified, initial 03/12/2022 03/12/2022 encounter Resolved Problems Electronic Signature(s) Signed: 07/29/2022 3:41:21 PM By: Fredirick Maudlin MD FACS Entered By: Fredirick Maudlin on 07/29/2022 15:41:21 -------------------------------------------------------------------------------- Progress Note Details Patient Name: Date of Service: [name] [name] 07/29/2022 3:15 PM Medical Record Number: [id number] Patient Account Number: [id number] Date of Birth/Sex: Treating RN: January 17, 1951 (71 y.o. Marta Lamas Primary Care Provider: Dimas Chyle Other Clinician: Referring Provider: Treating Provider/Extender: Randalyn Rhea in Treatment: 19 Subjective Chief Complaint Information obtained from Patient The patient presents to the wound care center today for evaluation of a stage III sacral pressure ulcer as well as a dehisced surgical wound from an emergent femoral hernia repair. History of Present Illness (HPI) ADMISSION 03/12/2022 This is a 71 year old woman who underwent an emergency femoral hernia repair secondary to strangulated small bowel. She required a bowel resection and reanastomosis. While she was in the hospital, the wound dehisced and she was initially felt to have an enterocutaneous fistula. They were managing the effluent with an ostomy bag. She saw her general surgeon on May 11. At that time, there was no evidence of EC fistula and the wound was closing in nicely. This is currently being managed with silver alginate. Also during her hospitalization, she unfortunately developed a pressure ulcer. This is currently stage III. She is in a skilled nursing facility at the moment but will be discharged on Sunday. This wound is being managed with Dakin's wet to dry dressing changes. She is not a diabetic, nor does she  smoke. Her only real medical history is Parkinson's disease and fairly significant protein-calorie malnutrition. 04/04/2022: The inguinal wound is closed. The sacral wound is a little bit smaller today. It is clean without significant slough or odor. It continues to undermine for about 4 to 5 cm in the cranial direction. 04/11/2022: The orifice of her sacral wound continues to contract. She still has extensive undermining present. The wound is clean without slough or odor. She did not receive a wound VAC yet because her insurance has a contract with a different provider than the one we usually  use. 04/18/2022: The wound is contracting but still has a deep area that is undermined. Apparently the cost for the wound VAC that she would be responsible for is prohibitive. She currently has Medicaid pending. The wound surface is pink and healthy-looking. There is just a little bit of slough and biofilm on the surface. 04/25/2022: The patient and her family decided they wanted to go ahead with wound VAC application. It has been delivered to their home, but they did not bring it with them because they understood that home health would initiate the therapy. Today, the wound surface looks clean and viable with good granulation tissue, but there is a musty odor and blue-green drainage on her dressing, consistent with Pseudomonas aeruginosa. 05/02/2022: For unclear reasons, the patient was told that the levofloxacin that I prescribed was a sulfa derived antibiotic and she did not take it. It does not make sense and I am not sure how that happened, but I reiterated to the patient that it is not a sulfa derived antibiotic and she can go ahead and take the medication. The wound VAC was initiated by home health on Monday. Unfortunately, it appears that they are not placing the foam all the way up into the undermined portion of the wound, as there is just a small button of foam identified when the wound VAC was removed today.  There is some slough accumulation, as one might expect, within the wound bed. The periwound skin is intact. 05/17/2022: The wound VAC malfunctioned and was removed yesterday. On inspection, there was still a piece of sponge left in situ. It was removed and was a tiny piece of white sponge, clearly inadequate to fill the extensive undermining at the cranial portion of the wound. There has been no improvement in the undermining. She has hypertrophic granulation tissue at the orifice of the wound. Periwound skin is in good condition. 05/24/2022: The undermining has improved quite a bit since switching to just black foam; it only extends from 9-12 o'clock instead of circumferentially. The underlying wound surface has healthy-looking granulation tissue. No malodor or drainage. 06/07/2022: The wound orifice is quite small and the tunneling has come in substantially. No concern for infection. 06/21/2022: The patient was ill last week and could not attend clinic. Today, the tunneling/undermining has come in quite a bit. No purulent drainage or malodor. No concern for infection. 07/05/2022: The undermining has not really come in at all since our last visit. The wound orifice is about the same size. I do see hypertrophic granulation tissue protruding on the wound surface. 07/15/2022: The tunnel has contracted quite a bit since our last visit. She continues to accumulate hypertrophic granulation tissue. 07/29/2022: The tunnel continues to contract. The wound surface is healthy with good granulation tissue. Patient History Information obtained from Patient, Caregiver. Family History Cancer - Mother,Father,Siblings, Hypertension - Siblings,Child. Social History Never smoker, Marital Status - Divorced, Alcohol Use - Never, Drug Use - No History, Caffeine Use - Moderate - Coffee, soda. Medical History Eyes Patient has history of Cataracts Musculoskeletal Patient has history of Osteoarthritis Medical A Surgical  History Notes nd Gastrointestinal GERD Musculoskeletal degenerative disc disease Objective Constitutional No acute distress.. Vitals Time Taken: 3:25 PM, Height: 67 in, Weight: 106 lbs, BMI: 16.6, Temperature: 97.8 F, Pulse: 73 bpm, Respiratory Rate: 18 breaths/min, Blood Pressure: 112/78 mmHg. Respiratory Normal work of breathing on room air.. General Notes: 07/29/2022: The tunnel continues to contract. The wound surface is healthy with good granulation tissue. Integumentary (Hair, Skin) Wound #  2 status is Open. Original cause of wound was Pressure Injury. The date acquired was: 02/17/2022. The wound has been in treatment 19 weeks. The wound is located on the Sacrum. The wound measures 1cm length x 0.8cm width x 0.3cm depth; 0.628cm^2 area and 0.188cm^3 volume. There is Fat Layer (Subcutaneous Tissue) exposed. There is no tunneling noted, however, there is undermining starting at 12:00 and ending at 12:00 with a maximum distance of 0.5cm. There is a medium amount of purulent drainage noted. The wound margin is well defined and not attached to the wound base. There is large (67-100%) red granulation within the wound bed. There is a small (1-33%) amount of necrotic tissue within the wound bed including Adherent Slough. Assessment Active Problems ICD-10 Pressure ulcer of sacral region, stage 3 Parkinson's disease Plan Follow-up Appointments: Return Appointment in 1 week. - Dr. Celine Ahr - Room 4 Anesthetic: Wound #2 Sacrum: (In clinic) Topical Lidocaine 5% applied to wound bed - in clinic, prior to debridement Bathing/ Shower/ Hygiene: May shower with protection but do not get wound dressing(s) wet. Off-Loading: Low air-loss mattress (Group 2) Turn and reposition every 2 hours Home Health: No change in wound care orders this week; continue Home Health for wound care. May utilize formulary equivalent dressing for wound treatment orders unless otherwise specified. - iodoform packing  strips 1/4in. home health to continue to change. Dressing changes to be completed by [facility] on Monday / Wednesday / Friday except when patient has scheduled visit at [facility]. Other Home Health Orders/Instructions: [facility] WOUND #2: - Sacrum Wound Laterality: Cleanser: Soap and Water Every Other Day/30 Days Discharge Instructions: May shower and wash wound with dial antibacterial soap and water prior to dressing change. Cleanser: Wound Cleanser Every Other Day/30 Days Discharge Instructions: Cleanse the wound with wound cleanser prior to applying a clean dressing using gauze sponges, not tissue or cotton balls. Peri-Wound Care: Skin Prep Every Other Day/30 Days Discharge Instructions: Use skin prep as directed Prim Dressing: Iodoform packing strip 1/4 (in) [facility]) Every Other Day/30 Days ary Discharge Instructions: Lightly pack into tunneling and undermining. Secondary Dressing: ALLEVYN Gentle Border, 4x4 (in/in) Every Other Day/30 Days Discharge Instructions: Apply over primary dressing as directed. 07/29/2022: The tunnel continues to contract. The wound surface is healthy with good granulation tissue. No debridement was necessary today. Continue packing with iodoform strips. Follow up in 2 weeks. Electronic Signature(s) Signed: 07/29/2022 3:49:37 PM By: Fredirick Maudlin MD FACS Entered By: Fredirick Maudlin on 07/29/2022 15:49:37 -------------------------------------------------------------------------------- HxROS Details Patient Name: Date of Service: [name] [name] 07/29/2022 3:15 PM Medical Record Number: [id number] Patient Account Number: [id number] Date of Birth/Sex: Treating RN: 1951/01/19 (71 y.o. Marta Lamas Primary Care Provider: Dimas Chyle Other Clinician: Referring Provider: Treating Provider/Extender: Randalyn Rhea in Treatment: 19 Information Obtained From Patient Caregiver Eyes Medical History: Positive for:  Cataracts Gastrointestinal Medical History: Past Medical History Notes: GERD Musculoskeletal Medical History: Positive for: Osteoarthritis Past Medical History Notes: degenerative disc disease HBO Extended History Items Eyes: Cataracts Immunizations Pneumococcal Vaccine: Received Pneumococcal Vaccination: Yes Received Pneumococcal Vaccination On or After 60th Birthday: No Implantable Devices None Family and Social History Cancer: Yes - Mother,Father,Siblings; Hypertension: Yes - Siblings,Child; Never smoker; Marital Status - Divorced; Alcohol Use: Never; Drug Use: No History; Caffeine Use: Moderate - Coffee, soda; Financial Concerns: No; Food, Clothing or Shelter Needs: No; Support System Lacking: No; Transportation Concerns: No Engineer, maintenance) Signed: 07/29/2022 3:55:52 PM By: Fredirick Maudlin MD FACS Signed: 07/29/2022  4:21:20 PM By: Blanche East RN Entered By: Fredirick Maudlin on 07/29/2022 15:43:51 -------------------------------------------------------------------------------- SuperBill Details Patient Name: Date of Service: [name] [name] 07/29/2022 Medical Record Number: [id number] Patient Account Number: [id number] Date of Birth/Sex: Treating RN: 08-Jun-1951 (71 y.o. Marta Lamas Primary Care Provider: Dimas Chyle Other Clinician: Referring Provider: Treating Provider/Extender: Randalyn Rhea in Treatment: 19 Diagnosis Coding ICD-10 Codes Code Description L89.153 Pressure ulcer of sacral region, stage 3 G20 Parkinson's disease Facility Procedures CPT4 Code: [id number] Description: 99213 - WOUND CARE VISIT-LEV 3 EST PT Modifier: 25 Quantity: 1 Physician Procedures : CPT4 Code Description Modifier [id number] - WC PHYS LEVEL 4 - EST PT ICD-10 Diagnosis Description L89.153 Pressure ulcer of sacral region, stage 3 G20 Parkinson's disease Quantity: 1 Electronic Signature(s) Signed: 07/29/2022 3:55:52 PM By: Fredirick Maudlin  MD FACS Signed: 07/29/2022 4:21:20 PM By: Blanche East RN Previous Signature: 07/29/2022 3:50:16 PM Version By: Fredirick Maudlin MD FACS Entered By: Blanche East on 07/29/2022 15:52:31

## 2022-07-29 NOTE — Progress Notes (Signed)
N/A N/A Wound Margin: Large (67-100%) N/A N/A Granulation A mount: Red N/A N/A Granulation Quality: Small (1-33%) N/A N/A Necrotic A mount: Fat Layer (Subcutaneous Tissue): Yes N/A N/A Exposed Structures: Fascia: No Tendon: No Muscle: No Joint: No Bone: No Medium (34-66%) N/A N/A Epithelialization: Treatment Notes Electronic Signature(s) Signed: 07/29/2022 3:42:46 PM By: Cannon, Jennifer MD FACS Signed: 07/29/2022 4:21:20 PM By: Zochol, [name] RN Entered By: Cannon, Jennifer on 07/29/2022 15:42:46 -------------------------------------------------------------------------------- Multi-Disciplinary Care Plan Details Patient Name: Date of Service: [name] [name], [name] L. 07/29/2022 3:15 PM Medical Record Number: [id number] Patient Account Number: [id number] Date of Birth/Sex: Treating RN: 02/12/1951 (71 y.o. F) Zochol, [name] Primary Care : Other Clinician: Parker, Caleb Referring : Treating /Extender: Cannon, Jennifer Parker, Caleb Weeks in Treatment: 19 Multidisciplinary Care Plan reviewed with physician Active Inactive Pressure Nursing Diagnoses: Knowledge deficit related to causes and risk factors for pressure ulcer development Knowledge deficit related to management of pressures ulcers Potential for impaired tissue integrity related to pressure, friction, moisture, and shear Goals: Patient/caregiver will verbalize risk factors for pressure ulcer development Date Initiated:  03/12/2022 Date Inactivated: 04/11/2022 Target Resolution Date: 04/12/2022 Goal Status: Met Patient/caregiver will verbalize understanding of pressure ulcer management Date Initiated: 03/12/2022 Target Resolution Date: 08/12/2022 Goal Status: Active Interventions: Assess: immobility, friction, shearing, incontinence upon admission and as needed Assess offloading mechanisms upon admission and as needed Assess potential for pressure ulcer upon admission and as needed Provide education on pressure ulcers Notes: Electronic Signature(s) Signed: 07/29/2022 4:21:20 PM By: Zochol, [name] RN Entered By: Zochol, [name] on 07/29/2022 15:34:55 -------------------------------------------------------------------------------- Pain Assessment Details Patient Name: Date of Service: Lipson, [name] L. 07/29/2022 3:15 PM Medical Record Number: [id number] Patient Account Number: [id number] Date of Birth/Sex: Treating RN: 10/14/1951 (71 y.o. F) Zochol, [name] Primary Care : Parker, Caleb Other Clinician: Referring : Treating /Extender: Cannon, Jennifer Parker, Caleb Weeks in Treatment: 19 Active Problems Location of Pain Severity and Description of Pain Patient Has Paino No Site Locations Pain Management and Medication Current Pain Management: Electronic Signature(s) Signed: 07/29/2022 4:21:20 PM By: Zochol, [name] RN Entered By: Zochol, [name] on 07/29/2022 15:28:36 -------------------------------------------------------------------------------- Patient/Caregiver Education Details Patient Name: Date of Service: Kras, [name] L. 10/2/2023andnbsp3:15 PM Medical Record Number: [id number] Patient Account Number: [id number] Date of Birth/Gender: Treating RN: 11/23/1950 (71 y.o. F) Zochol, [name] Primary Care Physician: Parker, Caleb Other Clinician: Referring Physician: Treating Physician/Extender: Cannon, Jennifer Parker, Caleb Weeks in Treatment: 19 Education  Assessment Education Provided To: Patient Education Topics Provided Pressure: Methods: Explain/Verbal Responses: Reinforcements needed, State content correctly Electronic Signature(s) Signed: 07/29/2022 4:21:20 PM By: Zochol, [name] RN Entered By: Zochol, [name] on 07/29/2022 15:35:06 -------------------------------------------------------------------------------- Wound Assessment Details Patient Name: Date of Service: Dickmann, [name] L. 07/29/2022 3:15 PM Medical Record Number: [id number] Patient Account Number: [id number] Date of Birth/Sex: Treating RN: 04/01/1951 (71 y.o. F) Zochol, [name] Primary Care : Parker, Caleb Other Clinician: Referring : Treating /Extender: Cannon, Jennifer Parker, Caleb Weeks in Treatment: 19 Wound Status Wound Number: 2 Primary Etiology: Pressure Ulcer Wound Location: Sacrum Wound Status: Open Wounding Event: Pressure Injury Comorbid History: Cataracts, Osteoarthritis Date Acquired: 02/17/2022 Weeks Of Treatment: 19 Clustered Wound: No Photos Wound Measurements Length: (cm) 1 Width: (cm) 0.8 Depth: (cm) 0.3 Area: (cm) 0.628 Volume: (cm) 0.188 % Reduction in Area: 90.5% % Reduction in Volume: 99.1% Epithelialization: Medium (34-66%) Tunneling: No Undermining: Yes Starting Position (o'clock): 12 Ending Position (o'clock): 12 Maximum Distance: (cm) 0.5 Wound Description Classification: Category/Stage III Wound Margin: Well defined, not attached Exudate Amount: Medium Exudate Type: Purulent Exudate Color: yellow,   brown, green Foul Odor After Cleansing: No Slough/Fibrino Yes Wound Bed Granulation Amount: Large (67-100%) Exposed Structure Granulation Quality: Red Fascia Exposed: No Necrotic Amount: Small (1-33%) Fat Layer (Subcutaneous Tissue) Exposed: Yes Necrotic Quality: Adherent Slough Tendon Exposed: No Muscle Exposed: No Joint Exposed: No Bone Exposed: No Periwound Skin Texture Texture Color No  Abnormalities Noted: No No Abnormalities Noted: No Moisture No Abnormalities Noted: No Treatment Notes Wound #2 (Sacrum) Cleanser Soap and Water Discharge Instruction: May shower and wash wound with dial antibacterial soap and water prior to dressing change. Wound Cleanser Discharge Instruction: Cleanse the wound with wound cleanser prior to applying a clean dressing using gauze sponges, not tissue or cotton balls. Peri-Wound Care Skin Prep Discharge Instruction: Use skin prep as directed Topical Primary Dressing Iodoform packing strip 1/4 (in) Discharge Instruction: Lightly pack into tunneling and undermining. Secondary Dressing ALLEVYN Gentle Border, 4x4 (in/in) Discharge Instruction: Apply over primary dressing as directed. Secured With Compression Wrap Compression Stockings Environmental education officer) Signed: 07/29/2022 4:21:20 PM By: Blanche East RN Entered By: Blanche East on 07/29/2022 15:33:55 -------------------------------------------------------------------------------- Vitals Details Patient Name: Date of Service: [name] 07/29/2022 3:15 PM Medical Record Number: [id number] Patient Account Number: [id number] Date of Birth/Sex: Treating RN: [birth year] (71 y.o. Iver Nestle, [name] Primary Care Tarnisha Kachmar: Dimas Chyle Other Clinician: Referring Aveline Daus: Treating Hildred Mollica/Extender: Randalyn Rhea in Treatment: 19 Vital Signs Time Taken: 15:25 Temperature (F): 97.8 Height (in): 67 Pulse (bpm): 73 Weight (lbs): 106 Respiratory Rate (breaths/min): 18 Body Mass Index (BMI): 16.6 Blood Pressure (mmHg): 112/78 Reference Range: 80 - 120 mg / dl Electronic Signature(s) Signed: 07/29/2022 4:21:20 PM By: Blanche East RN Entered By: Blanche East on 07/29/2022 15:28:31  Imaging (photographs - any number of wounds) [] - 0 Wound Tracing (instead of photographs) [] - 0 Simple Wound Measurement - one wound [] - 0 Complex Wound Measurement - multiple wounds INTERVENTIONS - Wound Dressings X - Small Wound Dressing one or multiple wounds 1 10 [] - 0 Medium Wound Dressing one or multiple wounds [] - 0 Large Wound Dressing one or multiple wounds X- 1 5 Application of Medications - topical [] - 0 Application of Medications - injection INTERVENTIONS - Miscellaneous [] - 0 External ear exam [] - 0 Specimen Collection (cultures, biopsies, blood, body fluids, etc.) [] - 0 Specimen(s) / Culture(s) sent or taken to Lab for analysis [] - 0 Patient Transfer (multiple staff / Civil Service fast streamer / Similar devices) [] - 0 Simple Staple / Suture removal (25 or less) [] - 0 Complex Staple / Suture removal (26 or more) [] - 0 Hypo / Hyperglycemic Management (close monitor of Blood Glucose) [] - 0 Ankle / Brachial Index (ABI) - do not check if billed separately X- 1 5 Vital Signs Has the patient been seen at the hospital within the last three years: Yes Total Score: 100 Level Of Care: New/Established - Level 3 Electronic Signature(s) Signed: 07/29/2022 4:21:20 PM By: Blanche East RN Entered By: Blanche East on 07/29/2022 15:52:21 -------------------------------------------------------------------------------- Encounter Discharge Information Details Patient Name: Date of Service: [name] 07/29/2022 3:15 PM Medical Record Number: [id number] Patient Account Number: [id number] Date of Birth/Sex: Treating RN: [birth year] (71 y.o. Marta Lamas Primary Care Euell Schiff: Dimas Chyle Other Clinician: Referring Tawn Fitzner: Treating Yadier Bramhall/Extender: Randalyn Rhea in Treatment: 19 Encounter Discharge  Information Items Discharge Condition: Stable Ambulatory Status: Walker Discharge Destination: Home Transportation: Private Auto Accompanied By: self Schedule Follow-up Appointment: Yes Clinical Summary of Care: Electronic Signature(s) Signed: 07/29/2022 4:21:20 PM By: Blanche East RN Entered By: Blanche East on 07/29/2022 15:52:57 -------------------------------------------------------------------------------- Lower Extremity Assessment Details Patient Name: Date of Service: LEVITA, [name] [name] 07/29/2022 3:15 PM Medical Record Number: [id number] Patient Account Number: [id number] Date of Birth/Sex: Treating RN: November 11, 1950 (71 y.o. Marta Lamas Primary Care Alixis Harmon: Dimas Chyle Other Clinician: Referring Annalysa Mohammad: Treating Jadis Mika/Extender: Randalyn Rhea in Treatment: 19 Electronic Signature(s) Signed: 07/29/2022 4:21:20 PM By: Blanche East RN Entered By: Blanche East on 07/29/2022 15:28:41 -------------------------------------------------------------------------------- Multi Wound Chart Details Patient Name: Date of Service: [name] 07/29/2022 3:15 PM Medical Record Number: [id number] Patient Account Number: [id number] Date of Birth/Sex: Treating RN: 19-Apr-1951 (71 y.o. Iver Nestle, [name] Primary Care Priscila Bean: Dimas Chyle Other Clinician: Referring Caroline Longie: Treating Evelin Cake/Extender: Randalyn Rhea in Treatment: 19 Vital Signs Height(in): 67 Pulse(bpm): 73 Weight(lbs): 106 Blood Pressure(mmHg): 112/78 Body Mass Index(BMI): 16.6 Temperature(F): 97.8 Respiratory Rate(breaths/min): 18 Photos: [N/A:N/A] Sacrum N/A N/A Wound Location: Pressure Injury N/A N/A Wounding Event: Pressure Ulcer N/A N/A Primary Etiology: Cataracts, Osteoarthritis N/A N/A Comorbid History: 02/17/2022 N/A N/A Date Acquired: 41 N/A N/A Weeks of Treatment: Open N/A N/A Wound Status: No N/A N/A Wound Recurrence: 1x0.8x0.3  N/A N/A Measurements L x W x D (cm) 0.628 N/A N/A A (cm) : rea 0.188 N/A N/A Volume (cm) : 90.50% N/A N/A % Reduction in A rea: 99.10% N/A N/A % Reduction in Volume: 12 Starting Position 1 (o'clock): 12 Ending Position 1 (o'clock): 0.5 Maximum Distance 1 (cm): Yes N/A N/A Undermining: Category/Stage III N/A N/A Classification: Medium N/A N/A Exudate A mount: Purulent N/A N/A Exudate Type: yellow, brown, green N/A N/A Exudate Color: Well defined, not attached  brown, green Foul Odor After Cleansing: No Slough/Fibrino Yes Wound Bed Granulation Amount: Large (67-100%) Exposed Structure Granulation Quality: Red Fascia Exposed: No Necrotic Amount: Small (1-33%) Fat Layer (Subcutaneous Tissue) Exposed: Yes Necrotic Quality: Adherent Slough Tendon Exposed: No Muscle Exposed: No Joint Exposed: No Bone Exposed: No Periwound Skin Texture Texture Color No  Abnormalities Noted: No No Abnormalities Noted: No Moisture No Abnormalities Noted: No Treatment Notes Wound #2 (Sacrum) Cleanser Soap and Water Discharge Instruction: May shower and wash wound with dial antibacterial soap and water prior to dressing change. Wound Cleanser Discharge Instruction: Cleanse the wound with wound cleanser prior to applying a clean dressing using gauze sponges, not tissue or cotton balls. Peri-Wound Care Skin Prep Discharge Instruction: Use skin prep as directed Topical Primary Dressing Iodoform packing strip 1/4 (in) Discharge Instruction: Lightly pack into tunneling and undermining. Secondary Dressing ALLEVYN Gentle Border, 4x4 (in/in) Discharge Instruction: Apply over primary dressing as directed. Secured With Compression Wrap Compression Stockings Add-Ons Electronic Signature(s) Signed: 07/29/2022 4:21:20 PM By: Zochol, [name] RN Entered By: Zochol, [name] on 07/29/2022 15:33:55 -------------------------------------------------------------------------------- Vitals Details Patient Name: Date of Service: Starzyk, [name] L. 07/29/2022 3:15 PM Medical Record Number: [id number] Patient Account Number: [id number] Date of Birth/Sex: Treating RN: 09/10/1951 (71 y.o. F) Zochol, [name] Primary Care : Parker, Caleb Other Clinician: Referring : Treating /Extender: Cannon, Jennifer Parker, Caleb Weeks in Treatment: 19 Vital Signs Time Taken: 15:25 Temperature (°F): 97.8 Height (in): 67 Pulse (bpm): 73 Weight (lbs): 106 Respiratory Rate (breaths/min): 18 Body Mass Index (BMI): 16.6 Blood Pressure (mmHg): 112/78 Reference Range: 80 - 120 mg / dl Electronic Signature(s) Signed: 07/29/2022 4:21:20 PM By: Zochol, [name] RN Entered By: Zochol, [name] on 07/29/2022 15:28:31 

## 2022-08-07 DIAGNOSIS — R509 Fever, unspecified: Secondary | ICD-10-CM | POA: Diagnosis not present

## 2022-08-07 DIAGNOSIS — R42 Dizziness and giddiness: Secondary | ICD-10-CM | POA: Diagnosis not present

## 2022-08-07 DIAGNOSIS — H6121 Impacted cerumen, right ear: Secondary | ICD-10-CM | POA: Diagnosis not present

## 2022-08-12 ENCOUNTER — Ambulatory Visit (HOSPITAL_BASED_OUTPATIENT_CLINIC_OR_DEPARTMENT_OTHER): Payer: Medicare HMO | Admitting: General Surgery

## 2022-08-15 DIAGNOSIS — K632 Fistula of intestine: Secondary | ICD-10-CM | POA: Diagnosis not present

## 2022-08-15 DIAGNOSIS — J9601 Acute respiratory failure with hypoxia: Secondary | ICD-10-CM | POA: Diagnosis not present

## 2022-08-15 DIAGNOSIS — L89154 Pressure ulcer of sacral region, stage 4: Secondary | ICD-10-CM | POA: Diagnosis not present

## 2022-08-15 DIAGNOSIS — G20C Parkinsonism, unspecified: Secondary | ICD-10-CM | POA: Diagnosis not present

## 2022-08-19 ENCOUNTER — Encounter (HOSPITAL_BASED_OUTPATIENT_CLINIC_OR_DEPARTMENT_OTHER): Payer: Medicare HMO | Admitting: General Surgery

## 2022-08-19 DIAGNOSIS — G20A1 Parkinson's disease without dyskinesia, without mention of fluctuations: Secondary | ICD-10-CM | POA: Diagnosis not present

## 2022-08-19 DIAGNOSIS — L89153 Pressure ulcer of sacral region, stage 3: Secondary | ICD-10-CM | POA: Diagnosis not present

## 2022-08-20 DIAGNOSIS — L89153 Pressure ulcer of sacral region, stage 3: Secondary | ICD-10-CM | POA: Diagnosis not present

## 2022-09-02 ENCOUNTER — Telehealth: Payer: Self-pay | Admitting: Family Medicine

## 2022-09-02 NOTE — Telephone Encounter (Signed)
Ok to give VO? 

## 2022-09-02 NOTE — Telephone Encounter (Signed)
...  Home Health Verbal Orders  Agency:  [facility]  Caller: Engineer, production and title [phone]  Requesting OT/ PT/ Skilled nursing/ Social Work/ Speech:  Education of wound care  Reason for Request:  Wound is healing  Frequency:  1x wk for 3 wks  HH needs F2F w/in last 30 days

## 2022-09-03 NOTE — Progress Notes (Deleted)
Chronic Care Management Pharmacy Note  09/03/2022 Name:  CAMESHA FAROOQ MRN:  [id number] DOB:  08/16/1951  Summary: Patient still in SNF.  She should be coming home next weekend and we have scheduled her meds to be delivered sometime around the end of next week.  Plan: FU 6 months   Subjective: LAMAE FOSCO is an 71 y.o. year old female who is a primary patient of Jerline Pain, Algis Greenhouse, MD.  The CCM team was consulted for assistance with disease management and care coordination needs.    Engaged with patient by telephone for follow up visit in response to provider referral for pharmacy case management and/or care coordination services.   Consent to Services:  The patient was given the following information about Chronic Care Management services today, agreed to services, and gave verbal consent: 1. CCM service includes personalized support from designated clinical staff supervised by the primary care provider, including individualized plan of care and coordination with other care providers 2. 24/7 contact phone numbers for assistance for urgent and routine care needs. 3. Service will only be billed when office clinical staff spend 20 minutes or more in a month to coordinate care. 4. Only one practitioner may furnish and bill the service in a calendar month. 5.The patient may stop CCM services at any time (effective at the end of the month) by phone call to the office staff. 6. The patient will be responsible for cost sharing (co-pay) of up to 20% of the service fee (after annual deductible is met). Patient agreed to services and consent obtained.  Patient Care Team: Vivi Barrack, MD as PCP - General (Family Medicine) Kathrynn Ducking, MD (Inactive) as Consulting Physician (Neurology) Volanda Napoleon, MD as Consulting Physician (Oncology) Edythe Clarity, [facility] (Pharmacist)  Recent office visits:  None since last medication coordination call   Recent consult visits:  11/20/2021 OV  (Neurology) Murriel Hopper, MD; We will try starting extended release carbidopa/levodopa to see if this can help with symptoms with less sedating side effects   Hospital visits:  None since last medication coordination call   Objective:  Lab Results  Component Value Date   CREATININE 0.53 04/17/2022   BUN 16 04/17/2022   GFR 87.51 09/25/2021   GFRNONAA >60 04/17/2022   GFRAA >60 01/31/2020   NA 139 04/17/2022   K 3.9 04/17/2022   CALCIUM 9.7 04/17/2022   CO2 28 04/17/2022   GLUCOSE 107 (H) 04/17/2022    Lab Results  Component Value Date/Time   HGBA1C 5.4 01/19/2022 04:36 AM   HGBA1C 5.8 09/25/2021 03:20 PM   GFR 87.51 09/25/2021 03:20 PM   GFR 103.67 08/05/2019 03:12 PM    Last diabetic Eye exam: No results found for: "HMDIABEYEEXA"  Last diabetic Foot exam: No results found for: "HMDIABFOOTEX"   Lab Results  Component Value Date   CHOL 161 09/25/2021   HDL 70.30 09/25/2021   LDLCALC 80 09/25/2021   TRIG 58 02/04/2022   CHOLHDL 2 09/25/2021       Latest Ref Rng & Units 04/17/2022    5:13 PM 02/07/2022    3:54 AM 02/04/2022    4:30 AM  Hepatic Function  Total Protein 6.5 - 8.1 g/dL 7.9  5.8  5.6   Albumin 3.5 - 5.0 g/dL 3.9  1.8  1.7   AST 15 - 41 U/L 15  31  32   ALT 0 - 44 U/L _0 Alk Phosphatase  38 - 126 U/L 69  56  65   Total Bilirubin 0.3 - 1.2 mg/dL 0.5  0.3  0.6     Lab Results  Component Value Date/Time   TSH 0.90 09/25/2021 03:20 PM   TSH 0.51 08/05/2019 03:12 PM       Latest Ref Rng & Units 04/17/2022    5:13 PM 02/09/2022    3:06 AM 02/06/2022    3:20 AM  CBC  WBC 4.0 - 10.5 K/uL 5.7  8.8  13.4   Hemoglobin 12.0 - 15.0 g/dL 13.5  9.0  8.2   Hematocrit 36.0 - 46.0 % 43.1  27.9  25.9   Platelets 150 - 400 K/uL 171  463  398     Lab Results  Component Value Date/Time   VD25OH 37.66 08/05/2019 03:12 PM    Clinical ASCVD: No  The 10-year ASCVD risk score (Arnett DK, et al., 2019) is: 12.4%   Values used to calculate the score:      Age: 26 years     Sex: Female     Is Non-Hispanic African American: Yes     Diabetic: No     Tobacco smoker: No     Systolic Blood Pressure: 751 mmHg     Is BP treated: No     HDL Cholesterol: 70.3 mg/dL     Total Cholesterol: 161 mg/dL       05/21/2022    2:27 PM 03/19/2022    2:20 PM 12/14/2021    3:09 PM  Depression screen PHQ 2/9  Decreased Interest 0 0 0  Down, Depressed, Hopeless 0 0 0  PHQ - 2 Score 0 0 0     Social History   Tobacco Use  Smoking Status Never  Smokeless Tobacco Never   BP Readings from Last 3 Encounters:  06/28/22 117/73  04/17/22 108/78  03/19/22 108/74   Pulse Readings from Last 3 Encounters:  06/28/22 (!) 44  04/17/22 94  03/19/22 87   Wt Readings from Last 3 Encounters:  06/28/22 103 lb 6.4 oz (46.9 kg)  05/21/22 130 lb (59 kg)  04/17/22 110 lb (49.9 kg)   BMI Readings from Last 3 Encounters:  06/28/22 16.19 kg/m  05/21/22 20.36 kg/m  04/17/22 17.23 kg/m    Assessment/Interventions: Review of patient past medical history, allergies, medications, health status, including review of consultants reports, laboratory and other test data, was performed as part of comprehensive evaluation and provision of chronic care management services.   SDOH:  (Social Determinants of Health) assessments and interventions performed: Yes SDOH Interventions    Flowsheet Row Chronic Care Management from 02/14/2022 in [place] Interventions --  [referral to care guide,  anticipated need with health decline]  Transportation Interventions Other (Comment)      Financial Resource Strain: Low Risk  (12/14/2021)   Overall Financial Resource Strain (CARDIA)    Difficulty of Paying Living Expenses: Not hard at all    [place]: No Food Insecurity (12/14/2021)  Housing: Low Risk  (02/14/2022)  Transportation Needs: No Transportation Needs (12/14/2021)  Depression (PHQ2-9): Low  Risk  (05/21/2022)  Financial Resource Strain: Low Risk  (12/14/2021)  Physical Activity: Unknown (12/14/2021)  Social Connections: Moderately Isolated (12/14/2021)  Stress: No Stress Concern Present (12/14/2021)  Tobacco Use: Low Risk  (06/28/2022)    CCM Care Plan  Allergies  Allergen Reactions   Sulfa Antibiotics Other (See Comments)    Childhood  allergy     Medications Reviewed Today     Reviewed by Bing Neighbors, CMA (Certified Medical Assistant) on 06/28/22 at 33  Med List Status: <None>   Medication Order Taking? Sig Documenting Provider Last Dose Status Informant  acetaminophen (TYLENOL) 160 MG/5ML solution [id number]  Take 20.3 mLs (650 mg total) by mouth every 6 (six) hours as needed for mild pain. Wellington Hampshire, PA-C  Active    Patient not taking:   Discontinued 02/01/20 0633   Amino Acids-Protein Hydrolys (FEEDING SUPPLEMENT, PRO-STAT 64,) LIQD [id number]  Take 30 mLs by mouth 3 (three) times daily with meals. Vivi Barrack, MD  Active   Ascorbic Acid (VITAMIN C PO) [id number]  Take 1 tablet by mouth daily. [provider]  Active Self, Child  b complex vitamins capsule [id number]  Take 1 capsule by mouth daily. [provider]  Active Self, Child  carbidopa-levodopa (SINEMET IR) 25-250 MG tablet [id number]  Take 1 tablet by mouth 4 (four) times daily. Barb Merino, MD  Expired 03/13/22 2359   Cholecalciferol (VITAMIN D) 2000 UNITS CAPS [id number]  Take 2,000 Units by mouth daily. [provider]  Active Self, Child  feeding supplement (ENSURE ENLIVE / ENSURE PLUS) LIQD [id number]  Take 237 mLs by mouth 3 (three) times daily between meals. Adolm Joseph  Active   glucosamine-chondroitin 500-400 MG tablet [id number]  Take 1 tablet by mouth daily. [provider]  Active Self, Child  guaiFENesin-dextromethorphan (ROBITUSSIN DM) 100-10 MG/5ML syrup [id number]  Take 15 mLs by mouth 2 (two) times daily. Meuth, Brooke A, PA-C  Active    hydrocortisone cream 1 % [id number]  Apply topically daily. Meuth, Brooke A, PA-C  Active   hydrOXYzine (ATARAX) 10 MG tablet [id number]  Take 1 tablet (10 mg total) by mouth 3 (three) times daily as needed for anxiety. Vivi Barrack, MD  Active   leptospermum manuka honey (MEDIHONEY) Hildred Alamin [id number]  Apply 1 application. topically daily. Meuth, Brooke A, PA-C  Active   lidocaine (LIDODERM) 5 % [id number]  Place 1 patch onto the skin daily. Remove & Discard patch within 12 hours or as directed by MD Ileene Rubens, Blaine Hamper, PA-C  Active   Multiple Vitamin (MULTIVITAMIN) capsule [id number]  Take 1 capsule by mouth daily. [provider]  Active Self, Child  Omega-3 Fatty Acids (OMEGA 3 PO) [id number]  Take 1 tablet by mouth daily. [provider]  Active Self, Child  pantoprazole (PROTONIX) 40 MG tablet [id number]  Take 1 tablet (40 mg total) by mouth daily. Vivi Barrack, MD  Active   QUEtiapine (SEROQUEL) 25 MG tablet [id number]  Take 1 tablet (25 mg total) by mouth at bedtime.  Patient not taking: Reported on 03/06/2022   Barb Merino, MD  Active   tetrahydrozoline-zinc (VISINE-AC) 0.05-0.25 % ophthalmic solution [id number]  Place 2 drops into both eyes 4 (four) times daily as needed (allergies). [provider]  Active Self, Child  vitamin B-12 (CYANOCOBALAMIN) 1000 MCG tablet [id number]  Take 1,000 mcg by mouth daily. [provider]  Active Self, Child            Patient Active Problem List   Diagnosis Date Noted   B12 deficiency 06/28/2022   Enterocutaneous fistula 02/05/2022   Normocytic anemia 02/05/2022   Pressure injury of coccygeal region, stage 1 01/23/2022   Goals of care, counseling/discussion    Protein-calorie malnutrition, severe 01/20/2022   DDD (degenerative disc disease), cervical 06/18/2019   Constipation 03/02/2019  Seasonal allergies 03/02/2019   Anxiety 05/07/2018   Fatigue 04/20/2018   Osteoarthritis 04/20/2018   Palpitations  04/20/2018   Cardiomegaly 04/20/2018   Lesion of liver 04/20/2018   Lung nodule 04/20/2018   Debility 01/17/2015   Parkinson disease ([place]) 01/17/2015   Fibroid uterus 11/13/2012   Benign colonic polyp 10/02/2012    Immunization History  Administered Date(s) Administered   Fluad Quad(high Dose 65+) 08/05/2019, 09/25/2021   PNEUMOCOCCAL CONJUGATE-20 09/25/2021    Conditions to be addressed/monitored:  Parkinson's Disease, Anxiety, Constipation, Allergies  There are no care plans that you recently modified to display for this patient.      Medication Assistance: None required.  Patient affirms current coverage meets needs.  Compliance/Adherence/Medication fill history: Care Gaps: Zoster Vaccine  Star-Rating Drugs: None  Patient's preferred pharmacy is:  [facility] - [place], [place] - [address] [place] [address] Phone: [phone] Fax: [place], [address] AT [address] [place] [address] Phone: [phone] Fax: [phone]   Uses pill box? Yes Pt endorses 100% compliance  We discussed: Verbal consent obtained for [facility] enhanced pharmacy services (medication synchronization, adherence packaging, delivery coordination). A medication sync plan was created to allow patient to get all medications delivered once every 30 to 90 days per patient preference. Patient understands they have freedom to choose pharmacy and clinical pharmacist will coordinate care between all prescribers and [facility]. Patient decided to: Utilize [facility] for medication synchronization, packaging and delivery  Care Plan and Follow Up Patient Decision:  Patient agrees to Care Plan and Follow-up.  Plan: The care management team will reach out to the patient again over the next 180 days.  Beverly Milch, PharmD Clinical Pharmacist [phone]     Current Barriers:  Currently in SNF  Pharmacist Clinical Goal(s):  Patient will work to make medication delivery and pickup easier through collaboration with PharmD and provider.   Interventions: 1:1 collaboration with Vivi Barrack, MD regarding development and update of comprehensive plan of care as evidenced by provider attestation and co-signature Inter-disciplinary care team collaboration (see longitudinal plan of care) Comprehensive medication review performed; medication list updated in electronic medical record  Depression/Anxiety (Goal: Minimize symptoms) -Controlled -Current treatment: Hydroxyzine 50m tid prn Appropriate, Effective, Safe, Accessible -Medications previously tried/failed: citalopram -PHQ9:  [place] Total Score 5      Update 03/05/22 No longer taking Lexampro, Valium, or Seroquel. Doing well on as needed Atarax at the moment. She has plans to continue this post release home. Will continue to follow and assess for further needs. No changes at this time.   -GAD7:  GAD 7 : Generalized Anxiety Score 09/17/2018 05/07/2018  Nervous, Anxious, on Edge 1 3  Control/stop worrying 0 1  Worry too much - different things 0 0  Trouble relaxing 1 3  Restless 0 1  Easily annoyed or irritable 0 0  Afraid - awful might happen 0 0  Total GAD 7 Score 2 8  Anxiety Difficulty Somewhat difficult Very difficult  -Educated on Benefits of medication for symptom control -Patient taking medication at night and having a difficult time sleeping longer than 4-5 hours. -Recommended to continue current medication Trial taking medication in the morning to see if this helps with sleep concern.  Parkinson's Disease (Goal:  Control symptoms) -Controlled -Current treatment  Carbidopa-levodopa 25-250 mg four times per day Appropriate,  Effective, Safe, Accessible -Medications previously tried: none noted -Only takes twice daily because it makes her sleepy  -She is able to take care of herself, perform ADL's still -Recommended to continue current medication  Update 03/05/22 Stable on current dose of Carb/levo. Daughter reports she is doing better, plans to come home next weekend. Currently getting all meds from SNF. Will set up to have meds delivered when she returns home. No changes will FU at discharge to ensure correct dose of all meds are delivered.  Constipation (Goal: Regular bowel movements) -Controlled -Current treatment  Colace 161m prn -Medications previously tried: Amitiza (too strong) -Bowels are normal at this time  -Recommended to continue current medication  Allergies (Goal: Minimize symptoms) -Controlled -Current treatment  Fluticasone 549m prn -Medications previously tried: none noted -Does not have to use very often, allergy symptoms have been controlled lately  -Recommended to continue current medication  Osteoarthritis (Goal: Control symptoms) -Controlled -Current treatment  None -Medications previously tried: none noted -Pain level remains the same - no changes at this time  -Recommended to continue current medication  Patient Goals/Self-Care Activities Patient will:  - take medications as prescribed focus on medication adherence by pill counts target a minimum of 150 minutes of moderate intensity exercise weekly  Follow Up Plan: The care management team will reach out to the patient again over the next 180 days.          Patient Care Plan: LCSW Care Plan     Problem Identified: Quality of Life      Goal: Quality of Life enhanced   Start Date: 01/17/2022  This Visit's Progress: Not on track  Recent Progress: On track  Priority: High  Note:   Current Barriers:  Level of Care Concerns:Inability to perform ADL's independently  CSW Clinical Goal(s):  Patient  will  demonstrate a reduction in symptoms related to :symptoms of anxiety  through collaboration with Clinical SoEducation officer, museumprovider, and care team.   Interventions: 1:1 collaboration with primary care provider regarding development and update of comprehensive plan of care as evidenced by provider attestation and co-signature Inter-disciplinary care team collaboration (see longitudinal plan of care) Evaluation of current treatment plan related to  self management and patient's adherence to plan as established by provide  Level of Care Concerns in a patient with Anxiety:  (Status: Goal Not Met.) Current level of care: home, alone and temp at SNF Evaluation of patient's unmet needs in current living environment ADL's Community Alternative Program  (CAP) provided information Personal Care Services (PCS) :(Medicaid pending will call LCSW once approved)   Mental Health:  (Status: Condition stable. Not addressed this visit.) Evaluation of current treatment plan related to  symptoms of anxiety Solution-Focused Strategies employed:  Problem So[place]un[place] Parktrategies reviewed  Social Determinants of Health in Patient with Anxiety and Parkinson Disease:  (Status: Goal Met. No Needs Identified this visit) SDOH assessments completed: Housing  and TrBrewing technologistf current treatment plan related to SDOH    Task & activities to accomplish goals: [place] [phone] me as soon as you are approved for Medicaid

## 2022-09-03 NOTE — Telephone Encounter (Signed)
VO given to [place] at [phone]

## 2022-09-03 NOTE — Telephone Encounter (Signed)
Ok with me. Please place any necessary orders. 

## 2022-09-06 ENCOUNTER — Encounter (HOSPITAL_BASED_OUTPATIENT_CLINIC_OR_DEPARTMENT_OTHER): Payer: Medicare HMO | Attending: General Surgery | Admitting: General Surgery

## 2022-09-06 DIAGNOSIS — L89153 Pressure ulcer of sacral region, stage 3: Secondary | ICD-10-CM | POA: Diagnosis not present

## 2022-09-06 NOTE — Progress Notes (Addendum)
TALEEN, [name] ([id number]) [id number] Page 1 of 6 Visit Report for 09/06/2022 Arrival Information Details Patient Name: Date of Service: [name] [name], [name] [name] 09/06/2022 3:15 PM Medical Record Number: [id number] Patient Account Number: [id number] Date of Birth/Sex: Treating RN: 1951-06-29 (71 y.o. [name] [name], [name] [name] Primary Care Gilbert Manolis: Dimas Chyle Other Clinician: Referring Eulalio Reamy: Treating Phillipe Clemon/Extender: Randalyn Rhea in Treatment: 25 Visit Information History Since Last Visit Added or deleted any medications: No Patient Arrived: Ambulatory Any new allergies or adverse reactions: No Arrival Time: 15:17 Had a fall or experienced change in No Accompanied By: daughter activities of daily living that may affect Transfer Assistance: None risk of falls: Patient Identification Verified: Yes Signs or symptoms of abuse/neglect since last visito No Secondary Verification Process Completed: Yes Hospitalized since last visit: No Patient Requires Transmission-Based Precautions: No Implantable device outside of the clinic excluding No Patient Has Alerts: No cellular tissue based products placed in the center since last visit: Has Dressing in Place as Prescribed: Yes Pain Present Now: No Electronic Signature(s) Signed: 09/06/2022 3:57:54 PM By: Adline Peals Entered By: Adline Peals on 09/06/2022 15:21:05 -------------------------------------------------------------------------------- Encounter Discharge Information Details Patient Name: Date of Service: [name] [name] 09/06/2022 3:15 PM Medical Record Number: [id number] Patient Account Number: [id number] Date of Birth/Sex: Treating RN: 1950-11-26 (71 y.o. [name] [name] Primary Care Dung Prien: Dimas Chyle Other Clinician: Referring Mieko Kneebone: Treating Jazaria Jarecki/Extender: Randalyn Rhea in Treatment: 25 Encounter Discharge Information  Items Discharge Condition: Stable Ambulatory Status: Walker Discharge Destination: Home Transportation: Private Auto Accompanied By: daughter Schedule Follow-up Appointment: Yes Clinical Summary of Care: Patient Declined Electronic Signature(s) Signed: 09/06/2022 3:57:54 PM By: Adline Peals Entered By: Adline Peals on 09/06/2022 [place], Boomer ([id number]) [id number] Page 2 of 6 -------------------------------------------------------------------------------- Lower Extremity Assessment Details Patient Name: Date of Service: [name] [name], [name] [name] 09/06/2022 3:15 PM Medical Record Number: [id number] Patient Account Number: [id number] Date of Birth/Sex: Treating RN: 1951-02-18 (71 y.o. [name] [name] Primary Care [name] [name]: Dimas Chyle Other Clinician: Referring [name] [name]: Treating [name] [name]/Extender: Randalyn Rhea in Treatment: 25 Electronic Signature(s) Signed: 09/06/2022 3:57:54 PM By: Sabas Sous By: Adline Peals on 09/06/2022 15:21:42 -------------------------------------------------------------------------------- Multi Wound Chart Details Patient Name: Date of Service: [name] [name] 09/06/2022 3:15 PM Medical Record Number: [id number] Patient Account Number: [id number] Date of Birth/Sex: Treating RN: [birth year] (71 y.o. F) Primary Care [name] [name]: Dimas Chyle Other Clinician: Referring Lovely Kerins: Treating Kattaleya Alia/Extender: Randalyn Rhea in Treatment: 25 Vital Signs Height(in): 67 Pulse(bpm): 68 Weight(lbs): 106 Blood Pressure(mmHg): 113/75 Body Mass Index(BMI): 16.6 Temperature(F): 97.6 Respiratory Rate(breaths/min): 16 [2:Photos:] [N/A:N/A] Sacrum N/A N/A Wound Location: Pressure Injury N/A N/A Wounding Event: Pressure Ulcer N/A N/A Primary Etiology: Cataracts, Osteoarthritis N/A N/A Comorbid History: 02/17/2022 N/A N/A Date Acquired: 25  N/A N/A Weeks of Treatment: Open N/A N/A Wound Status: No N/A N/A Wound Recurrence: 0x0x0 N/A N/A Measurements L x W x D (cm) 0 N/A N/A A (cm) : rea 0 N/A N/A Volume (cm) : 100.00% N/A N/A % Reduction in A rea: 100.00% N/A N/A % Reduction in Volume: Category/Stage III N/A N/A Classification: Medium N/A N/A Exudate A mount: Serous N/A N/A Exudate Type: amber N/A N/A Exudate Color: Flat and Intact N/A N/A Wound Margin: None Present (0%) N/A N/A Granulation A mount: None Present (0%) N/A N/A Necrotic A mount: Fascia: No N/A N/A Exposed Structures: Fat Layer (Subcutaneous Tissue): No [name], [name] L ([id number]) [id number] Page 3 of 6 Tendon: No Muscle: No Joint: No Bone: No  TALEEN, [name] ([id number]) [id number] Page 1 of 6 Visit Report for 09/06/2022 Arrival Information Details Patient Name: Date of Service: [name] [name], [name] [name] 09/06/2022 3:15 PM Medical Record Number: [id number] Patient Account Number: [id number] Date of Birth/Sex: Treating RN: 1951-06-29 (71 y.o. [name] [name], [name] [name] Primary Care Gilbert Manolis: Dimas Chyle Other Clinician: Referring Eulalio Reamy: Treating Phillipe Clemon/Extender: Randalyn Rhea in Treatment: 25 Visit Information History Since Last Visit Added or deleted any medications: No Patient Arrived: Ambulatory Any new allergies or adverse reactions: No Arrival Time: 15:17 Had a fall or experienced change in No Accompanied By: daughter activities of daily living that may affect Transfer Assistance: None risk of falls: Patient Identification Verified: Yes Signs or symptoms of abuse/neglect since last visito No Secondary Verification Process Completed: Yes Hospitalized since last visit: No Patient Requires Transmission-Based Precautions: No Implantable device outside of the clinic excluding No Patient Has Alerts: No cellular tissue based products placed in the center since last visit: Has Dressing in Place as Prescribed: Yes Pain Present Now: No Electronic Signature(s) Signed: 09/06/2022 3:57:54 PM By: Adline Peals Entered By: Adline Peals on 09/06/2022 15:21:05 -------------------------------------------------------------------------------- Encounter Discharge Information Details Patient Name: Date of Service: [name] [name] 09/06/2022 3:15 PM Medical Record Number: [id number] Patient Account Number: [id number] Date of Birth/Sex: Treating RN: 1950-11-26 (71 y.o. [name] [name] Primary Care Dung Prien: Dimas Chyle Other Clinician: Referring Mieko Kneebone: Treating Jazaria Jarecki/Extender: Randalyn Rhea in Treatment: 25 Encounter Discharge Information  Items Discharge Condition: Stable Ambulatory Status: Walker Discharge Destination: Home Transportation: Private Auto Accompanied By: daughter Schedule Follow-up Appointment: Yes Clinical Summary of Care: Patient Declined Electronic Signature(s) Signed: 09/06/2022 3:57:54 PM By: Adline Peals Entered By: Adline Peals on 09/06/2022 [place], Boomer ([id number]) [id number] Page 2 of 6 -------------------------------------------------------------------------------- Lower Extremity Assessment Details Patient Name: Date of Service: [name] [name], [name] [name] 09/06/2022 3:15 PM Medical Record Number: [id number] Patient Account Number: [id number] Date of Birth/Sex: Treating RN: 1951-02-18 (71 y.o. [name] [name] Primary Care [name] [name]: Dimas Chyle Other Clinician: Referring [name] [name]: Treating [name] [name]/Extender: Randalyn Rhea in Treatment: 25 Electronic Signature(s) Signed: 09/06/2022 3:57:54 PM By: Sabas Sous By: Adline Peals on 09/06/2022 15:21:42 -------------------------------------------------------------------------------- Multi Wound Chart Details Patient Name: Date of Service: [name] [name] 09/06/2022 3:15 PM Medical Record Number: [id number] Patient Account Number: [id number] Date of Birth/Sex: Treating RN: [birth year] (71 y.o. F) Primary Care [name] [name]: Dimas Chyle Other Clinician: Referring Lovely Kerins: Treating Kattaleya Alia/Extender: Randalyn Rhea in Treatment: 25 Vital Signs Height(in): 67 Pulse(bpm): 68 Weight(lbs): 106 Blood Pressure(mmHg): 113/75 Body Mass Index(BMI): 16.6 Temperature(F): 97.6 Respiratory Rate(breaths/min): 16 [2:Photos:] [N/A:N/A] Sacrum N/A N/A Wound Location: Pressure Injury N/A N/A Wounding Event: Pressure Ulcer N/A N/A Primary Etiology: Cataracts, Osteoarthritis N/A N/A Comorbid History: 02/17/2022 N/A N/A Date Acquired: 25  N/A N/A Weeks of Treatment: Open N/A N/A Wound Status: No N/A N/A Wound Recurrence: 0x0x0 N/A N/A Measurements L x W x D (cm) 0 N/A N/A A (cm) : rea 0 N/A N/A Volume (cm) : 100.00% N/A N/A % Reduction in A rea: 100.00% N/A N/A % Reduction in Volume: Category/Stage III N/A N/A Classification: Medium N/A N/A Exudate A mount: Serous N/A N/A Exudate Type: amber N/A N/A Exudate Color: Flat and Intact N/A N/A Wound Margin: None Present (0%) N/A N/A Granulation A mount: None Present (0%) N/A N/A Necrotic A mount: Fascia: No N/A N/A Exposed Structures: Fat Layer (Subcutaneous Tissue): No [name], [name] L ([id number]) [id number] Page 3 of 6 Tendon: No Muscle: No Joint: No Bone: No  3:15 PM Medical Record Number: [id number] Patient Account Number: [id number] Date of Birth/Sex: Treating RN: Aug 13, 1951 (71 y.o. [name] [name] Primary Care Nyheim Seufert: Dimas Chyle Other Clinician: Referring Aidah Forquer: Treating Olean Sangster/Extender: Randalyn Rhea in Treatment: 25 Vital Signs Time Taken: 15:21 Temperature (F): 97.6 Height (in): 67 Pulse (bpm): 68 Weight (lbs): 106 Respiratory Rate (breaths/min): 16 Body Mass Index (BMI): 16.6 Blood Pressure (mmHg): 113/75 Reference Range: 80 - 120 mg / dl Electronic Signature(s) Signed: 09/06/2022 3:57:54 PM By: Adline Peals Entered By: Adline Peals on 09/06/2022 15:21:34

## 2022-09-06 NOTE — Progress Notes (Signed)
LOVEAH, LIKE ([id number]) [id number] Page 1 of 7 Visit Report for 09/06/2022 Chief Complaint Document Details Patient Name: Date of Service: [name] [name], [name] [name] 09/06/2022 3:15 PM Medical Record Number: [id number] Patient Account Number: [id number] Date of Birth/Sex: Treating RN: [birth year] (71 y.o. F) Primary Care Provider: Dimas Chyle Other Clinician: Referring Provider: Treating Provider/Extender: Randalyn Rhea in Treatment: 25 Information Obtained from: Patient Chief Complaint The patient presents to the wound care center today for evaluation of a stage III sacral pressure ulcer as well as a dehisced surgical wound from an emergent femoral hernia repair. Electronic Signature(s) Signed: 09/06/2022 4:13:31 PM By: Fredirick Maudlin MD FACS Entered By: Fredirick Maudlin on 09/06/2022 16:13:31 -------------------------------------------------------------------------------- HPI Details Patient Name: Date of Service: [name] [name] 09/06/2022 3:15 PM Medical Record Number: [id number] Patient Account Number: [id number] Date of Birth/Sex: Treating RN: Nov 03, 1950 (71 y.o. F) Primary Care Provider: Dimas Chyle Other Clinician: Referring Provider: Treating Provider/Extender: Randalyn Rhea in Treatment: 25 History of Present Illness HPI Description: ADMISSION 03/12/2022 This is a 71 year old woman who underwent an emergency femoral hernia repair secondary to strangulated small bowel. She required a bowel resection and reanastomosis. While she was in the hospital, the wound dehisced and she was initially felt to have an enterocutaneous fistula. They were managing the effluent with an ostomy bag. She saw her general surgeon on May 11. At that time, there was no evidence of EC fistula and the wound was closing in nicely. This is currently being managed with silver alginate. Also during her hospitalization, she  unfortunately developed a pressure ulcer. This is currently stage III. She is in a skilled nursing facility at the moment but will be discharged on Sunday. This wound is being managed with Dakin's wet to dry dressing changes. She is not a diabetic, nor does she smoke. Her only real medical history is Parkinson's disease and fairly significant protein-calorie malnutrition. 04/04/2022: The inguinal wound is closed. The sacral wound is a little bit smaller today. It is clean without significant slough or odor. It continues to undermine for about 4 to 5 cm in the cranial direction. 04/11/2022: The orifice of her sacral wound continues to contract. She still has extensive undermining present. The wound is clean without slough or odor. She did not receive a wound VAC yet because her insurance has a contract with a different provider than the one we usually use. 04/18/2022: The wound is contracting but still has a deep area that is undermined. Apparently the cost for the wound VAC that she would be responsible for is prohibitive. She currently has Medicaid pending. The wound surface is pink and healthy-looking. There is just a little bit of slough and biofilm on the surface. 04/25/2022: The patient and her family decided they wanted to go ahead with wound VAC application. It has been delivered to their home, but they did not bring it with them because they understood that home health would initiate the therapy. Today, the wound surface looks clean and viable with good granulation tissue, but there is a musty odor and blue-green drainage on her dressing, consistent with Pseudomonas aeruginosa. 05/02/2022: For unclear reasons, the patient was told that the levofloxacin that I prescribed was a sulfa derived antibiotic and she did not take it. It does not make sense and I am not sure how that happened, but I reiterated to the patient that it is not a sulfa derived antibiotic and she can go ahead and take the medication.  The wound  VAC was initiated by home health on Monday. Unfortunately, it appears that they are not placing the foam all the way up into the undermined portion of the wound, as there is just a small button of foam identified when the wound VAC was removed today. There is some slough accumulation, as one might expect, within the wound bed. The periwound skin is intact. 05/17/2022: The wound VAC malfunctioned and was removed yesterday. On inspection, there was still a piece of sponge left in situ. It was removed and was a tiny piece of white sponge, clearly inadequate to fill the extensive undermining at the cranial portion of the wound. There has been no improvement in the [place], [place] ([id number]) [id number] Page 2 of 7 undermining. She has hypertrophic granulation tissue at the orifice of the wound. Periwound skin is in good condition. 05/24/2022: The undermining has improved quite a bit since switching to just black foam; it only extends from 9-12 o'clock instead of circumferentially. The underlying wound surface has healthy-looking granulation tissue. No malodor or drainage. 06/07/2022: The wound orifice is quite small and the tunneling has come in substantially. No concern for infection. 06/21/2022: The patient was ill last week and could not attend clinic. Today, the tunneling/undermining has come in quite a bit. No purulent drainage or malodor. No concern for infection. 07/05/2022: The undermining has not really come in at all since our last visit. The wound orifice is about the same size. I do see hypertrophic granulation tissue protruding on the wound surface. 07/15/2022: The tunnel has contracted quite a bit since our last visit. She continues to accumulate hypertrophic granulation tissue. 07/29/2022: The tunnel continues to contract. The wound surface is healthy with good granulation tissue. 08/19/2022: The wound is down to just a couple of millimeters and the tunnel  has closed. She has some hypertrophic granulation tissue present at the wound orifice. 09/06/2022: Her wound is healed. Electronic Signature(s) Signed: 09/06/2022 4:13:47 PM By: Fredirick Maudlin MD FACS Entered By: Fredirick Maudlin on 09/06/2022 16:13:47 -------------------------------------------------------------------------------- Physical Exam Details Patient Name: Date of Service: [name] [name], [name] [name] 09/06/2022 3:15 PM Medical Record Number: [id number] Patient Account Number: [id number] Date of Birth/Sex: Treating RN: [birth year] (71 y.o. F) Primary Care Provider: Dimas Chyle Other Clinician: Referring Provider: Treating Provider/Extender: Randalyn Rhea in Treatment: 25 Constitutional . . . . No acute distress.Marland Kitchen Respiratory Normal work of breathing on room air.. Notes 09/06/2022: Her wound is healed. Electronic Signature(s) Signed: 09/06/2022 4:14:22 PM By: Fredirick Maudlin MD FACS Entered By: Fredirick Maudlin on 09/06/2022 16:14:22 -------------------------------------------------------------------------------- Physician Orders Details Patient Name: Date of Service: [name] [name], [name] [name] 09/06/2022 3:15 PM Medical Record Number: [id number] Patient Account Number: [id number] Date of Birth/Sex: Treating RN: [birth year] (71 y.o. [name] Primary Care Provider: Dimas Chyle Other Clinician: Referring Provider: Treating Provider/Extender: Randalyn Rhea in Treatment: 25 Verbal / Phone Orders: No Diagnosis Coding ICD-10 [place], Welcome ([id number]) [id number] Page 3 of 7 Code Description L89.153 Pressure ulcer of sacral region, stage 3 G20 Parkinson's disease Discharge From [facility] Services Discharge from [place]!!!!!!!!!! Anesthetic (In clinic) Topical Lidocaine 4% applied to wound bed Off-Loading Low air-loss mattress (Group 2) Turn and reposition every 2  hours [place] home health for wound care. Other Home Health Orders/Instructions: [facility] [place] Patient Medications llergies: Sulfa (Sulfonamide Antibiotics) A Notifications Medication Indication Start End 09/06/2022 lidocaine DOSE topical 4 % cream - cream topical Electronic Signature(s) Signed: 09/06/2022 4:34:03 PM By: Fredirick Maudlin MD  FACS Previous Signature: 09/06/2022 3:57:54 PM Version By: Adline Peals Entered By: Fredirick Maudlin on 09/06/2022 16:17:12 -------------------------------------------------------------------------------- Problem List Details Patient Name: Date of Service: [name] [name], [name] [name] 09/06/2022 3:15 PM Medical Record Number: [id number] Patient Account Number: [id number] Date of Birth/Sex: Treating RN: 21-Sep-1951 (71 y.o. F) Primary Care Provider: Dimas Chyle Other Clinician: Referring Provider: Treating Provider/Extender: Randalyn Rhea in Treatment: 25 Active Problems ICD-10 Encounter Code Description Active Date MDM Diagnosis L89.153 Pressure ulcer of sacral region, stage 3 03/12/2022 No Yes G20 Parkinson's disease 03/12/2022 No Yes Inactive Problems ICD-10 Code Description Active Date Inactive Date T81.31XA Disruption of external operation (surgical) wound, not elsewhere classified, initial 03/12/2022 03/12/2022 encounter Resolved Problems ZOWIE, LUNDAHL ([id number]) [id number] Page 4 of 7 Electronic Signature(s) Signed: 09/06/2022 4:12:37 PM By: Fredirick Maudlin MD FACS Entered By: Fredirick Maudlin on 09/06/2022 16:12:36 -------------------------------------------------------------------------------- Progress Note Details Patient Name: Date of Service: [name] [name] 09/06/2022 3:15 PM Medical Record Number: [id number] Patient Account Number: [id number] Date of Birth/Sex: Treating RN: [birth year] (71 y.o. F) Primary Care Provider: Dimas Chyle Other  Clinician: Referring Provider: Treating Provider/Extender: Randalyn Rhea in Treatment: 25 Subjective Chief Complaint Information obtained from Patient The patient presents to the wound care center today for evaluation of a stage III sacral pressure ulcer as well as a dehisced surgical wound from an emergent femoral hernia repair. History of Present Illness (HPI) ADMISSION 03/12/2022 This is a 71 year old woman who underwent an emergency femoral hernia repair secondary to strangulated small bowel. She required a bowel resection and reanastomosis. While she was in the hospital, the wound dehisced and she was initially felt to have an enterocutaneous fistula. They were managing the effluent with an ostomy bag. She saw her general surgeon on May 11. At that time, there was no evidence of EC fistula and the wound was closing in nicely. This is currently being managed with silver alginate. Also during her hospitalization, she unfortunately developed a pressure ulcer. This is currently stage III. She is in a skilled nursing facility at the moment but will be discharged on Sunday. This wound is being managed with Dakin's wet to dry dressing changes. She is not a diabetic, nor does she smoke. Her only real medical history is Parkinson's disease and fairly significant protein-calorie malnutrition. 04/04/2022: The inguinal wound is closed. The sacral wound is a little bit smaller today. It is clean without significant slough or odor. It continues to undermine for about 4 to 5 cm in the cranial direction. 04/11/2022: The orifice of her sacral wound continues to contract. She still has extensive undermining present. The wound is clean without slough or odor. She did not receive a wound VAC yet because her insurance has a contract with a different provider than the one we usually use. 04/18/2022: The wound is contracting but still has a deep area that is undermined. Apparently the cost for  the wound VAC that she would be responsible for is prohibitive. She currently has Medicaid pending. The wound surface is pink and healthy-looking. There is just a little bit of slough and biofilm on the surface. 04/25/2022: The patient and her family decided they wanted to go ahead with wound VAC application. It has been delivered to their home, but they did not bring it with them because they understood that home health would initiate the therapy. Today, the wound surface looks clean and viable with good granulation tissue, but there is a musty odor and blue-green drainage on her dressing, consistent  with Pseudomonas aeruginosa. 05/02/2022: For unclear reasons, the patient was told that the levofloxacin that I prescribed was a sulfa derived antibiotic and she did not take it. It does not make sense and I am not sure how that happened, but I reiterated to the patient that it is not a sulfa derived antibiotic and she can go ahead and take the medication. The wound VAC was initiated by home health on Monday. Unfortunately, it appears that they are not placing the foam all the way up into the undermined portion of the wound, as there is just a small button of foam identified when the wound VAC was removed today. There is some slough accumulation, as one might expect, within the wound bed. The periwound skin is intact. 05/17/2022: The wound VAC malfunctioned and was removed yesterday. On inspection, there was still a piece of sponge left in situ. It was removed and was a tiny piece of white sponge, clearly inadequate to fill the extensive undermining at the cranial portion of the wound. There has been no improvement in the undermining. She has hypertrophic granulation tissue at the orifice of the wound. Periwound skin is in good condition. 05/24/2022: The undermining has improved quite a bit since switching to just black foam; it only extends from 9-12 o'clock instead of circumferentially. The underlying wound  surface has healthy-looking granulation tissue. No malodor or drainage. 06/07/2022: The wound orifice is quite small and the tunneling has come in substantially. No concern for infection. 06/21/2022: The patient was ill last week and could not attend clinic. Today, the tunneling/undermining has come in quite a bit. No purulent drainage or malodor. No concern for infection. 07/05/2022: The undermining has not really come in at all since our last visit. The wound orifice is about the same size. I do see hypertrophic granulation tissue protruding on the wound surface. 07/15/2022: The tunnel has contracted quite a bit since our last visit. She continues to accumulate hypertrophic granulation tissue. 07/29/2022: The tunnel continues to contract. The wound surface is healthy with good granulation tissue. 08/19/2022: The wound is down to just a couple of millimeters and the tunnel has closed. She has some hypertrophic granulation tissue present at the wound orifice. 09/06/2022: Her wound is healed. Patient History Information obtained from Patient, Caregiver. [name] [name], [name] [name] ([id number]) [id number] Page 5 of 7 Family History Cancer - Mother,Father,Siblings, Hypertension - Siblings,Child. Social History Never smoker, Marital Status - Divorced, Alcohol Use - Never, Drug Use - No History, Caffeine Use - Moderate - Coffee, soda. Medical History Eyes Patient has history of Cataracts Musculoskeletal Patient has history of Osteoarthritis Medical A Surgical History Notes nd Gastrointestinal GERD Musculoskeletal degenerative disc disease Objective Constitutional No acute distress.. Vitals Time Taken: 3:21 PM, Height: 67 in, Weight: 106 lbs, BMI: 16.6, Temperature: 97.6 F, Pulse: 68 bpm, Respiratory Rate: 16 breaths/min, Blood Pressure: 113/75 mmHg. Respiratory Normal work of breathing on room air.. General Notes: 09/06/2022: Her wound is healed. Integumentary (Hair,  Skin) Wound #2 status is Open. Original cause of wound was Pressure Injury. The date acquired was: 02/17/2022. The wound has been in treatment 25 weeks. The wound is located on the Sacrum. The wound measures 0cm length x 0cm width x 0cm depth; 0cm^2 area and 0cm^3 volume. There is no tunneling or undermining noted. There is a medium amount of serous drainage noted. The wound margin is flat and intact. There is no granulation within the wound bed. There is no necrotic tissue within the wound bed. The periwound skin  appearance had no abnormalities noted for texture. The periwound skin appearance had no abnormalities noted for moisture. The periwound skin appearance had no abnormalities noted for color. Periwound temperature was noted as No Abnormality. Assessment Active Problems ICD-10 Pressure ulcer of sacral region, stage 3 Parkinson's disease Plan Discharge From [facility] Services: Discharge from [place]!!!!!!!!!! Anesthetic: (In clinic) Topical Lidocaine 4% applied to wound bed Off-Loading: Low air-loss mattress (Group 2) Turn and reposition every 2 hours Home Health: [place] [facility] for wound care. Other Home Health Orders/Instructions: [facility] [place] The following medication(s) was prescribed: lidocaine topical 4 % cream cream topical was prescribed at facility 09/06/2022: Her wound is healed. I recommended that she continue to try to shift her weight frequently to avoid recurrence of her wound. She should maintain good protein intake. She can use Desitin or zinc oxide to avoid moisture related skin breakdown. We will discharge her from the wound care center. She may follow-up as needed. [name] [name], [name] [name] ([id number]) [id number] Page 6 of 7 Electronic Signature(s) Signed: 09/06/2022 4:17:56 PM By: Fredirick Maudlin MD FACS Entered By: Fredirick Maudlin on 09/06/2022  16:17:56 -------------------------------------------------------------------------------- HxROS Details Patient Name: Date of Service: [name] [name] 09/06/2022 3:15 PM Medical Record Number: [id number] Patient Account Number: [id number] Date of Birth/Sex: Treating RN: [birth year] (71 y.o. F) Primary Care Provider: Dimas Chyle Other Clinician: Referring Provider: Treating Provider/Extender: Randalyn Rhea in Treatment: 25 Information Obtained From Patient Caregiver Eyes Medical History: Positive for: Cataracts Gastrointestinal Medical History: Past Medical History Notes: GERD Musculoskeletal Medical History: Positive for: Osteoarthritis Past Medical History Notes: degenerative disc disease HBO Extended History Items Eyes: Cataracts Immunizations Pneumococcal Vaccine: Received Pneumococcal Vaccination: Yes Received Pneumococcal Vaccination On or After 60th Birthday: No Implantable Devices None Family and Social History Cancer: Yes - Mother,Father,Siblings; Hypertension: Yes - Siblings,Child; Never smoker; Marital Status - Divorced; Alcohol Use: Never; Drug Use: No History; Caffeine Use: Moderate - Coffee, soda; Financial Concerns: No; Food, Clothing or Shelter Needs: No; Support System Lacking: No; Transportation Concerns: No Electronic Signature(s) Signed: 09/06/2022 4:34:03 PM By: Fredirick Maudlin MD FACS Entered By: Fredirick Maudlin on 09/06/2022 16:13:52 SuperBill Details -------------------------------------------------------------------------------- [name] [name] ([id number]) [id number] Page 7 of 7 Patient Name: Date of Service: [name] [name], [name] [name] 09/06/2022 Medical Record Number: [id number] Patient Account Number: [id number] Date of Birth/Sex: Treating RN: [birth year] (71 y.o. F) Primary Care Provider: Dimas Chyle Other Clinician: Referring Provider: Treating Provider/Extender: Randalyn Rhea in Treatment: 25 Diagnosis Coding ICD-10 Codes Code Description [phone] Pressure ulcer of sacral region, stage 3 G20 Parkinson's disease Physician Procedures Quantity CPT4 Code Description Modifier [id number] - WC PHYS LEVEL 2 - EST PT 1 ICD-10 Diagnosis Description L89.153 Pressure ulcer of sacral region, stage 3 G20 Parkinson's disease Electronic Signature(s) Signed: 09/06/2022 4:19:18 PM By: Fredirick Maudlin MD FACS Entered By: Fredirick Maudlin on 09/06/2022 16:19:17

## 2022-09-16 ENCOUNTER — Telehealth: Payer: Medicare HMO

## 2022-09-17 ENCOUNTER — Ambulatory Visit: Payer: Medicare HMO | Admitting: Pharmacist

## 2022-09-17 DIAGNOSIS — G20A1 Parkinson's disease without dyskinesia, without mention of fluctuations: Secondary | ICD-10-CM

## 2022-09-17 DIAGNOSIS — F419 Anxiety disorder, unspecified: Secondary | ICD-10-CM

## 2022-09-17 NOTE — Progress Notes (Signed)
 Chronic Care Management Pharmacy Note  09/18/2022 Name:  [name] [name] MRN:  [id number] DOB:  03/15/1951  Summary: Patient reports her Parkinson's meds were changed and she feels more tired.  Now taking the CR prescribed by neuro.  She asks for me to discuss switch back to IR so that she can go back to feeling how she was previously.  Recommendation:  Consult with neuro on Sinemet formulation  Plan: FU 6 months   Subjective: [name] [name] is an 71 y.o. year old female who is a primary patient of Parker, Caleb M, MD.  The CCM team was consulted for assistance with disease management and care coordination needs.    Engaged with patient by telephone for follow up visit in response to provider referral for pharmacy case management and/or care coordination services.   Consent to Services:  The patient was given the following information about Chronic Care Management services today, agreed to services, and gave verbal consent: 1. CCM service includes personalized support from designated clinical staff supervised by the primary care provider, including individualized plan of care and coordination with other care providers 2. 24/7 contact phone numbers for assistance for urgent and routine care needs. 3. Service will only be billed when office clinical staff spend 20 minutes or more in a month to coordinate care. 4. Only one practitioner may furnish and bill the service in a calendar month. 5.The patient may stop CCM services at any time (effective at the end of the month) by phone call to the office staff. 6. The patient will be responsible for cost sharing (co-pay) of up to 20% of the service fee (after annual deductible is met). Patient agreed to services and consent obtained.  Patient Care Team: Parker, Caleb M, MD as PCP - General (Family Medicine) Willis, Charles K, MD (Inactive) as Consulting Physician (Neurology) Ennever, Peter R, MD as Consulting Physician (Oncology) ,   L, RPH (Pharmacist)  Recent office visits:  None since last medication coordination call   Recent consult visits:  11/20/2021 OV (Neurology) Morris, John C, MD; We will try starting extended release carbidopa/levodopa to see if this can help with symptoms with less sedating side effects   Hospital visits:  None since last medication coordination call   Objective:  Lab Results  Component Value Date   CREATININE 0.53 04/17/2022   BUN 16 04/17/2022   GFR 87.51 09/25/2021   GFRNONAA >60 04/17/2022   GFRAA >60 01/31/2020   NA 139 04/17/2022   K 3.9 04/17/2022   CALCIUM 9.7 04/17/2022   CO2 28 04/17/2022   GLUCOSE 107 (H) 04/17/2022    Lab Results  Component Value Date/Time   HGBA1C 5.4 01/19/2022 04:36 AM   HGBA1C 5.8 09/25/2021 03:20 PM   GFR 87.51 09/25/2021 03:20 PM   GFR 103.67 08/05/2019 03:12 PM    Last diabetic Eye exam: No results found for: "HMDIABEYEEXA"  Last diabetic Foot exam: No results found for: "HMDIABFOOTEX"   Lab Results  Component Value Date   CHOL 161 09/25/2021   HDL 70.30 09/25/2021   LDLCALC 80 09/25/2021   TRIG 58 02/04/2022   CHOLHDL 2 09/25/2021       Latest Ref Rng & Units 04/17/2022    5:13 PM 02/07/2022    3:54 AM 02/04/2022    4:30 AM  Hepatic Function  Total Protein 6.5 - 8.1 g/dL 7.9  5.8  5.6   Albumin 3.5 - 5.0 g/dL 3.9  1.8  1.7   AST 15 -    Chronic Care Management Pharmacy Note  09/18/2022 Name:  [name] [name] MRN:  [id number] DOB:  03/15/1951  Summary: Patient reports her Parkinson's meds were changed and she feels more tired.  Now taking the CR prescribed by neuro.  She asks for me to discuss switch back to IR so that she can go back to feeling how she was previously.  Recommendation:  Consult with neuro on Sinemet formulation  Plan: FU 6 months   Subjective: [name] [name] is an 71 y.o. year old female who is a primary patient of Parker, Caleb M, MD.  The CCM team was consulted for assistance with disease management and care coordination needs.    Engaged with patient by telephone for follow up visit in response to provider referral for pharmacy case management and/or care coordination services.   Consent to Services:  The patient was given the following information about Chronic Care Management services today, agreed to services, and gave verbal consent: 1. CCM service includes personalized support from designated clinical staff supervised by the primary care provider, including individualized plan of care and coordination with other care providers 2. 24/7 contact phone numbers for assistance for urgent and routine care needs. 3. Service will only be billed when office clinical staff spend 20 minutes or more in a month to coordinate care. 4. Only one practitioner may furnish and bill the service in a calendar month. 5.The patient may stop CCM services at any time (effective at the end of the month) by phone call to the office staff. 6. The patient will be responsible for cost sharing (co-pay) of up to 20% of the service fee (after annual deductible is met). Patient agreed to services and consent obtained.  Patient Care Team: Parker, Caleb M, MD as PCP - General (Family Medicine) Willis, Charles K, MD (Inactive) as Consulting Physician (Neurology) Ennever, Peter R, MD as Consulting Physician (Oncology) ,   L, RPH (Pharmacist)  Recent office visits:  None since last medication coordination call   Recent consult visits:  11/20/2021 OV (Neurology) Morris, John C, MD; We will try starting extended release carbidopa/levodopa to see if this can help with symptoms with less sedating side effects   Hospital visits:  None since last medication coordination call   Objective:  Lab Results  Component Value Date   CREATININE 0.53 04/17/2022   BUN 16 04/17/2022   GFR 87.51 09/25/2021   GFRNONAA >60 04/17/2022   GFRAA >60 01/31/2020   NA 139 04/17/2022   K 3.9 04/17/2022   CALCIUM 9.7 04/17/2022   CO2 28 04/17/2022   GLUCOSE 107 (H) 04/17/2022    Lab Results  Component Value Date/Time   HGBA1C 5.4 01/19/2022 04:36 AM   HGBA1C 5.8 09/25/2021 03:20 PM   GFR 87.51 09/25/2021 03:20 PM   GFR 103.67 08/05/2019 03:12 PM    Last diabetic Eye exam: No results found for: "HMDIABEYEEXA"  Last diabetic Foot exam: No results found for: "HMDIABFOOTEX"   Lab Results  Component Value Date   CHOL 161 09/25/2021   HDL 70.30 09/25/2021   LDLCALC 80 09/25/2021   TRIG 58 02/04/2022   CHOLHDL 2 09/25/2021       Latest Ref Rng & Units 04/17/2022    5:13 PM 02/07/2022    3:54 AM 02/04/2022    4:30 AM  Hepatic Function  Total Protein 6.5 - 8.1 g/dL 7.9  5.8  5.6   Albumin 3.5 - 5.0 g/dL 3.9  1.8  1.7   AST 15 -   Chronic Care Management Pharmacy Note  09/18/2022 Name:  [name] [name] MRN:  [id number] DOB:  10-30-1950  Summary: Patient reports her Parkinson's meds were changed and she feels more tired.  Now taking the CR prescribed by neuro.  She asks for me to discuss switch back to IR so that she can go back to feeling how she was previously.  Recommendation:  Consult with neuro on Sinemet formulation  Plan: FU 6 months   Subjective: [name] [name] is an 71 y.o. year old female who is a primary patient of Vivi Barrack, MD.  The CCM team was consulted for assistance with disease management and care coordination needs.    Engaged with patient by telephone for follow up visit in response to provider referral for pharmacy case management and/or care coordination services.   Consent to Services:  The patient was given the following information about Chronic Care Management services today, agreed to services, and gave verbal consent: 1. CCM service includes personalized support from designated clinical staff supervised by the primary care provider, including individualized plan of care and coordination with other care providers 2. 24/7 contact phone numbers for assistance for urgent and routine care needs. 3. Service will only be billed when office clinical staff spend 20 minutes or more in a month to coordinate care. 4. Only one practitioner may furnish and bill the service in a calendar month. 5.The patient may stop CCM services at any time (effective at the end of the month) by phone call to the office staff. 6. The patient will be responsible for cost sharing (co-pay) of up to 20% of the service fee (after annual deductible is met). Patient agreed to services and consent obtained.  Patient Care Team: Vivi Barrack, MD as PCP - General (Family Medicine) Kathrynn Ducking, MD (Inactive) as Consulting Physician (Neurology) Volanda Napoleon, MD as Consulting Physician (Oncology) Edythe Clarity, [facility] (Pharmacist)  Recent office visits:  None since last medication coordination call   Recent consult visits:  11/20/2021 OV (Neurology) Murriel Hopper, MD; We will try starting extended release carbidopa/levodopa to see if this can help with symptoms with less sedating side effects   Hospital visits:  None since last medication coordination call   Objective:  Lab Results  Component Value Date   CREATININE 0.53 04/17/2022   BUN 16 04/17/2022   GFR 87.51 09/25/2021   GFRNONAA >60 04/17/2022   GFRAA >60 01/31/2020   NA 139 04/17/2022   K 3.9 04/17/2022   CALCIUM 9.7 04/17/2022   CO2 28 04/17/2022   GLUCOSE 107 (H) 04/17/2022    Lab Results  Component Value Date/Time   HGBA1C 5.4 01/19/2022 04:36 AM   HGBA1C 5.8 09/25/2021 03:20 PM   GFR 87.51 09/25/2021 03:20 PM   GFR 103.67 08/05/2019 03:12 PM    Last diabetic Eye exam: No results found for: "HMDIABEYEEXA"  Last diabetic Foot exam: No results found for: "HMDIABFOOTEX"   Lab Results  Component Value Date   CHOL 161 09/25/2021   HDL 70.30 09/25/2021   LDLCALC 80 09/25/2021   TRIG 58 02/04/2022   CHOLHDL 2 09/25/2021       Latest Ref Rng & Units 04/17/2022    5:13 PM 02/07/2022    3:54 AM 02/04/2022    4:30 AM  Hepatic Function  Total Protein 6.5 - 8.1 g/dL 7.9  5.8  5.6   Albumin 3.5 - 5.0 g/dL 3.9  1.8  1.7   AST 15 -   Chronic Care Management Pharmacy Note  09/18/2022 Name:  [name] [name] MRN:  [id number] DOB:  08/28/1951  Summary: Patient reports her Parkinson's meds were changed and she feels more tired.  Now taking the CR prescribed by neuro.  She asks for me to discuss switch back to IR so that she can go back to feeling how she was previously.  Recommendation:  Consult with neuro on Sinemet formulation  Plan: FU 6 months   Subjective: [name] [name] is an 71 y.o. year old female who is a primary patient of Parker, Caleb M, MD.  The CCM team was consulted for assistance with disease management and care coordination needs.    Engaged with patient by telephone for follow up visit in response to provider referral for pharmacy case management and/or care coordination services.   Consent to Services:  The patient was given the following information about Chronic Care Management services today, agreed to services, and gave verbal consent: 1. CCM service includes personalized support from designated clinical staff supervised by the primary care provider, including individualized plan of care and coordination with other care providers 2. 24/7 contact phone numbers for assistance for urgent and routine care needs. 3. Service will only be billed when office clinical staff spend 20 minutes or more in a month to coordinate care. 4. Only one practitioner may furnish and bill the service in a calendar month. 5.The patient may stop CCM services at any time (effective at the end of the month) by phone call to the office staff. 6. The patient will be responsible for cost sharing (co-pay) of up to 20% of the service fee (after annual deductible is met). Patient agreed to services and consent obtained.  Patient Care Team: Parker, Caleb M, MD as PCP - General (Family Medicine) Willis, Charles K, MD (Inactive) as Consulting Physician (Neurology) Ennever, Peter R, MD as Consulting Physician (Oncology) ,   L, RPH (Pharmacist)  Recent office visits:  None since last medication coordination call   Recent consult visits:  11/20/2021 OV (Neurology) Morris, John C, MD; We will try starting extended release carbidopa/levodopa to see if this can help with symptoms with less sedating side effects   Hospital visits:  None since last medication coordination call   Objective:  Lab Results  Component Value Date   CREATININE 0.53 04/17/2022   BUN 16 04/17/2022   GFR 87.51 09/25/2021   GFRNONAA >60 04/17/2022   GFRAA >60 01/31/2020   NA 139 04/17/2022   K 3.9 04/17/2022   CALCIUM 9.7 04/17/2022   CO2 28 04/17/2022   GLUCOSE 107 (H) 04/17/2022    Lab Results  Component Value Date/Time   HGBA1C 5.4 01/19/2022 04:36 AM   HGBA1C 5.8 09/25/2021 03:20 PM   GFR 87.51 09/25/2021 03:20 PM   GFR 103.67 08/05/2019 03:12 PM    Last diabetic Eye exam: No results found for: "HMDIABEYEEXA"  Last diabetic Foot exam: No results found for: "HMDIABFOOTEX"   Lab Results  Component Value Date   CHOL 161 09/25/2021   HDL 70.30 09/25/2021   LDLCALC 80 09/25/2021   TRIG 58 02/04/2022   CHOLHDL 2 09/25/2021       Latest Ref Rng & Units 04/17/2022    5:13 PM 02/07/2022    3:54 AM 02/04/2022    4:30 AM  Hepatic Function  Total Protein 6.5 - 8.1 g/dL 7.9  5.8  5.6   Albumin 3.5 - 5.0 g/dL 3.9  1.8  1.7   AST 15 -   Chronic Care Management Pharmacy Note  09/18/2022 Name:  [name] [name] MRN:  [id number] DOB:  10-30-1950  Summary: Patient reports her Parkinson's meds were changed and she feels more tired.  Now taking the CR prescribed by neuro.  She asks for me to discuss switch back to IR so that she can go back to feeling how she was previously.  Recommendation:  Consult with neuro on Sinemet formulation  Plan: FU 6 months   Subjective: [name] [name] is an 71 y.o. year old female who is a primary patient of Vivi Barrack, MD.  The CCM team was consulted for assistance with disease management and care coordination needs.    Engaged with patient by telephone for follow up visit in response to provider referral for pharmacy case management and/or care coordination services.   Consent to Services:  The patient was given the following information about Chronic Care Management services today, agreed to services, and gave verbal consent: 1. CCM service includes personalized support from designated clinical staff supervised by the primary care provider, including individualized plan of care and coordination with other care providers 2. 24/7 contact phone numbers for assistance for urgent and routine care needs. 3. Service will only be billed when office clinical staff spend 20 minutes or more in a month to coordinate care. 4. Only one practitioner may furnish and bill the service in a calendar month. 5.The patient may stop CCM services at any time (effective at the end of the month) by phone call to the office staff. 6. The patient will be responsible for cost sharing (co-pay) of up to 20% of the service fee (after annual deductible is met). Patient agreed to services and consent obtained.  Patient Care Team: Vivi Barrack, MD as PCP - General (Family Medicine) Kathrynn Ducking, MD (Inactive) as Consulting Physician (Neurology) Volanda Napoleon, MD as Consulting Physician (Oncology) Edythe Clarity, [facility] (Pharmacist)  Recent office visits:  None since last medication coordination call   Recent consult visits:  11/20/2021 OV (Neurology) Murriel Hopper, MD; We will try starting extended release carbidopa/levodopa to see if this can help with symptoms with less sedating side effects   Hospital visits:  None since last medication coordination call   Objective:  Lab Results  Component Value Date   CREATININE 0.53 04/17/2022   BUN 16 04/17/2022   GFR 87.51 09/25/2021   GFRNONAA >60 04/17/2022   GFRAA >60 01/31/2020   NA 139 04/17/2022   K 3.9 04/17/2022   CALCIUM 9.7 04/17/2022   CO2 28 04/17/2022   GLUCOSE 107 (H) 04/17/2022    Lab Results  Component Value Date/Time   HGBA1C 5.4 01/19/2022 04:36 AM   HGBA1C 5.8 09/25/2021 03:20 PM   GFR 87.51 09/25/2021 03:20 PM   GFR 103.67 08/05/2019 03:12 PM    Last diabetic Eye exam: No results found for: "HMDIABEYEEXA"  Last diabetic Foot exam: No results found for: "HMDIABFOOTEX"   Lab Results  Component Value Date   CHOL 161 09/25/2021   HDL 70.30 09/25/2021   LDLCALC 80 09/25/2021   TRIG 58 02/04/2022   CHOLHDL 2 09/25/2021       Latest Ref Rng & Units 04/17/2022    5:13 PM 02/07/2022    3:54 AM 02/04/2022    4:30 AM  Hepatic Function  Total Protein 6.5 - 8.1 g/dL 7.9  5.8  5.6   Albumin 3.5 - 5.0 g/dL 3.9  1.8  1.7   AST 15 -

## 2022-09-18 NOTE — Patient Instructions (Addendum)
Visit Information   Goals Addressed             This Visit's Progress    Manage My Medicine   On track    Timeframe:  Long-Range Goal Priority:  High Start Date:    06/19/21                         Expected End Date:   12/20/21                    Follow Up Date 09/26/21    - call for medicine refill 2 or 3 days before it runs out - keep a list of all the medicines I take; vitamins and herbals too - use a pillbox to sort medicine    Why is this important?   These steps will help you keep on track with your medicines.   Notes:        Patient Care Plan: General Pharmacy (Adult)     Problem Identified: Parkinson's Disease, Anxiety, Constipation, Allergies   Priority: High  Onset Date: 06/19/2021     Long-Range Goal: Patient-Specific Goal   Start Date: 06/19/2021  Expected End Date: 12/20/2021  Recent Progress: On track  Priority: High  Note:   Current Barriers:  Carb/levo causing unwanted side effects  Pharmacist Clinical Goal(s):  Patient will work to make medication delivery and pickup easier through collaboration with PharmD and provider.   Interventions: 1:1 collaboration with Vivi Barrack, MD regarding development and update of comprehensive plan of care as evidenced by provider attestation and co-signature Inter-disciplinary care team collaboration (see longitudinal plan of care) Comprehensive medication review performed; medication list updated in electronic medical record  Depression/Anxiety (Goal: Minimize symptoms) 09/17/22 -Controlled -Current treatment: Diazepam 46m 1/2 to 1 tab q 12h Appropriate, Effective, Safe, Accessible -Medications previously tried/failed: citalopram -PHQ9:  [place] Total Score 5     Reports Diazepam has helped since she started taking regularly.  It calms her down to where she is able to rest if she needs to. Unclear if it is contributing to  fatigue and drowsiness reported during the day   Update 03/05/22 No longer taking Lexampro, Valium, or Seroquel. Doing well on as needed Atarax at the moment. She has plans to continue this post release home. Will continue to follow and assess for further needs. No changes at this time.   -GAD7:  GAD 7 : Generalized Anxiety Score 09/17/2018 05/07/2018  Nervous, Anxious, on Edge 1 3  Control/stop worrying 0 1  Worry too much - different things 0 0  Trouble relaxing 1 3  Restless 0 1  Easily annoyed or irritable 0 0  Afraid - awful might happen 0 0  Total GAD 7 Score 2 8  Anxiety Difficulty Somewhat difficult Very difficult  -Educated on Benefits of medication for symptom control -Patient taking medication at night and having a difficult time sleeping longer than 4-5 hours. -Recommended to continue current medication Trial taking medication in the morning to see if this helps with sleep concern.  Parkinson's Disease (Goal: Control symptoms) 09/17/22 -Controlled -Current treatment  Carbidopa-levodopa 50-2017mCR bid Appropriate, Query Effective -Medications previously tried: Sinemet IT -Today she reports she is feeling more lethargic and tired than previously.  After discussion it was discovered neuro had changed her to CR Sinemet upon her request.  Since this switch she has felt like she  has been more drowsy and not wanting to get up and do things.  Discussed potential switch back to IR.  She states that she would rather take this medication multiple times per day if it made her feel better.  Will pass along to neuro for input.  Update 03/05/22 Stable on current dose of Carb/levo. Daughter reports she is doing better, plans to come home next weekend. Currently getting all meds from SNF. Will set up to have meds delivered when she returns home. No changes will FU at discharge to ensure correct dose of all meds are delivered.  Constipation (Goal: Regular bowel  movements) -Controlled, not assessed -Current treatment  Colace 115m prn -Medications previously tried: Amitiza (too strong) -Bowels are normal at this time  -Recommended to continue current medication  Allergies (Goal: Minimize symptoms) -Controlled -Current treatment  Fluticasone 565m prn -Medications previously tried: none noted -Does not have to use very often, allergy symptoms have been controlled lately  -Recommended to continue current medication  Osteoarthritis (Goal: Control symptoms) -Controlled -Current treatment  None -Medications previously tried: none noted -Pain level remains the same - no changes at this time  -Recommended to continue current medication  Patient Goals/Self-Care Activities Patient will:  - take medications as prescribed focus on medication adherence by pill counts target a minimum of 150 minutes of moderate intensity exercise weekly  Follow Up Plan: The care management team will reach out to the patient again over the next 180 days.          Patient Care Plan: LCSW Care Plan     Problem Identified: Quality of Life      Goal: Quality of Life enhanced   Start Date: 01/17/2022  This Visit's Progress: Not on track  Recent Progress: On track  Priority: High  Note:   Current Barriers:  Level of Care Concerns:Inability to perform ADL's independently  CSW Clinical Goal(s):  Patient  will demonstrate a reduction in symptoms related to :symptoms of anxiety  through collaboration with Clinical SoEducation officer, museumprovider, and care team.   Interventions: 1:1 collaboration with primary care provider regarding development and update of comprehensive plan of care as evidenced by provider attestation and co-signature Inter-disciplinary care team collaboration (see longitudinal plan of care) Evaluation of current treatment plan related to  self management and patient's adherence to plan as established by provide  Level of Care Concerns in a  patient with Anxiety:  (Status: Goal Not Met.) Current level of care: home, alone and temp at SNF Evaluation of patient's unmet needs in current living environment ADL's Community Alternative Program  (CAP) provided information Personal Care Services (PCS) :(Medicaid pending will call LCSW once approved)   Mental Health:  (Status: Condition stable. Not addressed this visit.) Evaluation of current treatment plan related to  symptoms of anxiety Solution-Focused Strategies employed:  Problem SoMesquitetrategies reviewed  Social Determinants of Health in Patient with Anxiety and Parkinson Disease:  (Status: Goal Met. No Needs Identified this visit) SDOH assessments completed: Housing  and TrBrewing technologistf current treatment plan related to SDOH    Task & activities to accomplish goals: Call [place] [facility] [phone] me as soon as you are approved for Medicaid      The patient verbalized understanding of instructions, educational materials, and care plan provided today and DECLINED offer to receive copy of patient instructions, educational materials, and care plan.  Telephone follow up appointment with pharmacy team member scheduled for:  6 months  Edythe Clarity, [place], PharmD Clinical Pharmacist  [facility] [phone]

## 2022-09-27 ENCOUNTER — Telehealth: Payer: Self-pay | Admitting: Pharmacist

## 2022-09-27 NOTE — Progress Notes (Signed)
Chronic Care Management Pharmacy Assistant   Name: [name] [name]  MRN: [id number] DOB: 13-Aug-1951   Reason for Encounter: Medication Coordination Call    Recent office visits:  None  Recent consult visits:  None  Hospital visits:  None in previous 6 months  Medications: Outpatient Encounter Medications as of 09/27/2022  Medication Sig   acetaminophen (TYLENOL) 160 MG/5ML solution Take 20.3 mLs (650 mg total) by mouth every 6 (six) hours as needed for mild pain.   Amino Acids-Protein Hydrolys (FEEDING SUPPLEMENT, PRO-STAT 64,) LIQD Take 30 mLs by mouth 3 (three) times daily with meals.   Ascorbic Acid (VITAMIN C PO) Take 1 tablet by mouth daily.   b complex vitamins capsule Take 1 capsule by mouth daily.   carbidopa-levodopa (SINEMET IR) 25-250 MG tablet Take 1 tablet by mouth 4 (four) times daily.   Cholecalciferol (VITAMIN D) 2000 UNITS CAPS Take 2,000 Units by mouth daily.   diazepam (VALIUM) 5 MG tablet Take 0.5-1 tablets (2.5-5 mg total) by mouth every 12 (twelve) hours as needed for anxiety.   feeding supplement (ENSURE ENLIVE / ENSURE PLUS) LIQD Take 237 mLs by mouth 3 (three) times daily between meals.   glucosamine-chondroitin 500-400 MG tablet Take 1 tablet by mouth daily.   guaiFENesin-dextromethorphan (ROBITUSSIN DM) 100-10 MG/5ML syrup Take 15 mLs by mouth 2 (two) times daily.   hydrocortisone cream 1 % Apply topically daily.   lidocaine (LIDODERM) 5 % Place 1 patch onto the skin daily. Remove & Discard patch within 12 hours or as directed by MD   Multiple Vitamin (MULTIVITAMIN) capsule Take 1 capsule by mouth daily.   Omega-3 Fatty Acids (OMEGA 3 PO) Take 1 tablet by mouth daily.   pantoprazole (PROTONIX) 40 MG tablet Take 1 tablet (40 mg total) by mouth daily.   tetrahydrozoline-zinc (VISINE-AC) 0.05-0.25 % ophthalmic solution Place 2 drops into both eyes 4 (four) times daily as needed (allergies).   vitamin B-12 (CYANOCOBALAMIN) 1000 MCG tablet Take 1,000 mcg  by mouth daily.   [DISCONTINUED] amantadine (SYMMETREL) 100 MG capsule Take 1 capsule (100 mg total) by mouth 2 (two) times daily. (Patient not taking: Reported on 09/21/2019)   No facility-administered encounter medications on file as of 09/27/2022.   Reviewed chart for medication changes ahead of medication coordination call.  No OVs, Consults, or hospital visits since last care coordination call/Pharmacist visit.   No medication changes indicated.  BP Readings from Last 3 Encounters:  06/28/22 117/73  04/17/22 108/78  03/19/22 108/74    Lab Results  Component Value Date   HGBA1C 5.4 01/19/2022     Patient obtains medications through Vials  90 Days   Last adherence delivery included: Pantoprazole 40 mg once daily Carbidopa-Levodopa 25-100 mg three times daily Melatonin 3 mg at bedtime  Patient is due for next adherence delivery on: 10/08/2022. Called patient and reviewed medications and coordinated delivery.  This delivery to include: Pantoprazole 40 mg once daily Carbidopa-Levodopa 25-100 mg three times daily Melatonin 3 mg at bedtime  Patient needs refills for Pantoprazole.  Confirmed delivery date of 10/08/2022, advised patient that pharmacy will contact them the morning of delivery.   Care Gaps: Medicare Annual Wellness: Completed 12/14/2021 Hemoglobin A1C: 5.4% on 01/19/2022 Colonoscopy: Overdue since 01/10/2016 Dexa Scan: Completed Mammogram: Next due on 07/12/2023  Future Appointments  Date Time Provider [place]  12/27/2022  3:15 PM [facility] HEALTH COACH [facility]  03/24/2023  1:00 PM [facility] CCM PHARMACIST [facility]   April D Calhoun,  [place] Pharmacist Assistant [phone]

## 2022-09-30 ENCOUNTER — Other Ambulatory Visit: Payer: Self-pay | Admitting: *Deleted

## 2022-09-30 MED ORDER — PANTOPRAZOLE SODIUM 40 MG PO TBEC: 40.0000 mg | DELAYED_RELEASE_TABLET | Freq: Every day | ORAL | 1 refills | Status: DC

## 2022-10-11 DIAGNOSIS — G20A1 Parkinson's disease without dyskinesia, without mention of fluctuations: Secondary | ICD-10-CM | POA: Diagnosis not present

## 2022-10-25 ENCOUNTER — Other Ambulatory Visit: Payer: Self-pay | Admitting: Family Medicine

## 2022-10-25 NOTE — Telephone Encounter (Signed)
..   Encourage patient to contact the pharmacy for refills or they can request refills through [place]:  Please schedule appointment if longer than 1 year  NEXT APPOINTMENT DATE: 11/07/2022  MEDICATION:diazepam (VALIUM) 5 MG tablet   Is the patient out of medication? 2 pills left   PHARMACY: [facility] [place] [facility] [place]    Let patient know to contact pharmacy at the end of the day to make sure medication is ready.  Please notify patient to allow 48-72 hours to process   Yes    Patient states she only has 2 left- patient was advised per Dr Ellwood Handler notes she was to follow up with him after 06/28/22 apt - patient states she forgot - fu has been sch for 11/07/22.

## 2022-10-29 MED ORDER — DIAZEPAM 5 MG PO TABS
2.5000 mg | ORAL_TABLET | Freq: Two times a day (BID) | ORAL | 1 refills | Status: DC | PRN
Start: 2022-10-29 — End: 2022-11-21

## 2022-10-30 ENCOUNTER — Telehealth: Payer: Self-pay | Admitting: Family Medicine

## 2022-10-30 ENCOUNTER — Other Ambulatory Visit: Payer: Self-pay | Admitting: Family Medicine

## 2022-10-30 NOTE — Telephone Encounter (Signed)
Patient states she wants all future RX's to be sent to her Preferred Pharmacy:  [facility] - [place], [place] - [address] Phone: [phone]  Fax: [phone]      Patient states to only send RX's to [facility] if it is absolutely necessary/an emergency

## 2022-10-31 NOTE — Telephone Encounter (Signed)
Please make sure her preferred pharmacy is correct in the chart.  Algis Greenhouse. Jerline Pain, MD 10/31/2022 7:29 AM

## 2022-11-01 NOTE — Telephone Encounter (Signed)
Patients pharmacy's are listed correctly in her chart

## 2022-11-07 ENCOUNTER — Ambulatory Visit: Payer: Medicare HMO | Admitting: Family Medicine

## 2022-11-21 ENCOUNTER — Other Ambulatory Visit: Payer: Self-pay | Admitting: *Deleted

## 2022-11-21 ENCOUNTER — Telehealth: Payer: Self-pay | Admitting: *Deleted

## 2022-11-21 MED ORDER — DIAZEPAM 5 MG PO TABS: 2.5000 mg | ORAL_TABLET | Freq: Two times a day (BID) | ORAL | 1 refills | Status: DC | PRN

## 2022-11-21 NOTE — Telephone Encounter (Signed)
Patient is requesting refills for Diazepam to be sent to [facility] please

## 2022-12-10 ENCOUNTER — Ambulatory Visit (INDEPENDENT_AMBULATORY_CARE_PROVIDER_SITE_OTHER): Payer: Medicare HMO | Admitting: Family Medicine

## 2022-12-10 VITALS — BP 136/82 | HR 106 | Temp 97.5°F | Ht 67.0 in | Wt 99.0 lb

## 2022-12-10 DIAGNOSIS — L89151 Pressure ulcer of sacral region, stage 1: Secondary | ICD-10-CM | POA: Diagnosis not present

## 2022-12-10 DIAGNOSIS — F419 Anxiety disorder, unspecified: Secondary | ICD-10-CM | POA: Diagnosis not present

## 2022-12-10 DIAGNOSIS — R5381 Other malaise: Secondary | ICD-10-CM | POA: Diagnosis not present

## 2022-12-10 DIAGNOSIS — G20A1 Parkinson's disease without dyskinesia, without mention of fluctuations: Secondary | ICD-10-CM | POA: Diagnosis not present

## 2022-12-10 DIAGNOSIS — Z23 Encounter for immunization: Secondary | ICD-10-CM | POA: Diagnosis not present

## 2022-12-10 NOTE — Patient Instructions (Signed)
It was very nice to see you today!  I will place a referral for home health physical therapy and a home health aide.  We will give your flu shot today.  Will see you back in about 6 months for your annual checkup.  Please come back to see [place] sooner as needed.  Take care, Dr Jerline Pain  PLEASE NOTE:  If you had any lab tests, please let us know if you have not heard back within a few days. You may see your results on mychart before we have a chance to review them but we will give you a call once they are reviewed by [place].   If we ordered any referrals today, please let us know if you have not heard from their office within the next week.   If you had any urgent prescriptions sent in today, please check with the pharmacy within an hour of our visit to make sure the prescription was transmitted appropriately.   Please try these tips to maintain a healthy lifestyle:  Eat at least 3 REAL meals and 1-2 snacks per day.  Aim for no more than 5 hours between eating.  If you eat breakfast, please do so within one hour of getting up.   Each meal should contain half fruits/vegetables, one quarter protein, and one quarter carbs (no bigger than a computer mouse)  Cut down on sweet beverages. This includes juice, soda, and sweet tea.   Drink at least 1 glass of water with each meal and aim for at least 8 glasses per day  Exercise at least 150 minutes every week.  9

## 2022-12-10 NOTE — Assessment & Plan Note (Signed)
Follows with neurology.  Currently on Sinemet.  Will be placing referral to PT as above.

## 2022-12-10 NOTE — Progress Notes (Signed)
   [name] [name] is a 72 y.o. female who presents today for an office visit.  Assessment/Plan:   Chronic Problems Addressed Today: Debility She has had a slight general decline over the last few months.  Likely due to deconditioning though does have some underlying Parkinson disease which is contributing as well.  Also has protein calorie malnutrition which is contributing.  Will be placing referral to home health PT for further evaluation and strengthening exercises.  Parkinson disease [facility]) Follows with neurology.  Currently on Sinemet.  Will be placing referral to PT as above.  Anxiety Had worsening today related to coming to the doctor's office however symptoms do seem to be at baseline.  She can continue taking her Valium 2.5 to 5 mg twice daily as needed.  Pressure injury of coccygeal region, stage 1 She does have some scar tissue on exam today but no signs consistent with recurrence of pressure injury.  No signs of infection.  We did discuss importance of frequent repositioning to prevent this from occurring in the future.  We discussed reasons to return to care.  Will be placing referral to home health for debility as above.  Should hopefully help her with repositioning and wound monitoring as well.     Subjective:  HPI:  See A/P for status of chronic conditions.  Patient has a couple concerns today.  She has had more generalized weakness and debility.  She is interested in doing another round of physical therapy.  She did this a year ago and feels like it worked well.  She went through home health to have this done.  She feels like her Parkinson's is overall stable though could use some strengthening exercises.  She would also like for [place] to look at the site where she had a pressure ulcer last year.  She is felt some asymmetry of the area.  No pain.  No drainage.       Objective:  Physical Exam: BP 136/82   Pulse (!) 106   Temp (!) 97.5 F (36.4 C) (Temporal)   Ht 5'  7" (1.702 m)   Wt 99 lb (44.9 kg)   SpO2 97%   BMI 15.51 kg/m   Gen: No acute distress, resting comfortably CV: Regular rate and rhythm with no murmurs appreciated Pulm: Normal work of breathing, clear to auscultation bilaterally with no crackles, wheezes, or rhonchi GU: Chaperone present for exam.  Healing pressure ulcer noted at gluteal cleft with scar tissue present and slight asymmetry.  No areas of fluctuance.  No drainage.  No pain on palpation.  No erythema. Neuro: Grossly normal, moves all extremities Psych: Normal affect and thought content      [name] M. Jerline Pain, MD 12/10/2022 2:05 PM

## 2022-12-10 NOTE — Assessment & Plan Note (Signed)
Had worsening today related to coming to the doctor's office however symptoms do seem to be at baseline.  She can continue taking her Valium 2.5 to 5 mg twice daily as needed.

## 2022-12-10 NOTE — Assessment & Plan Note (Signed)
She has had a slight general decline over the last few months.  Likely due to deconditioning though does have some underlying Parkinson disease which is contributing as well.  Also has protein calorie malnutrition which is contributing.  Will be placing referral to home health PT for further evaluation and strengthening exercises.

## 2022-12-10 NOTE — Assessment & Plan Note (Signed)
She does have some scar tissue on exam today but no signs consistent with recurrence of pressure injury.  No signs of infection.  We did discuss importance of frequent repositioning to prevent this from occurring in the future.  We discussed reasons to return to care.  Will be placing referral to home health for debility as above.  Should hopefully help her with repositioning and wound monitoring as well.

## 2022-12-11 NOTE — Addendum Note (Signed)
Addended by: Vivi Barrack on: 12/11/2022 07:27 AM   Modules accepted: Orders

## 2022-12-17 ENCOUNTER — Ambulatory Visit (INDEPENDENT_AMBULATORY_CARE_PROVIDER_SITE_OTHER): Payer: Medicare HMO

## 2022-12-17 DIAGNOSIS — Z0000 Encounter for general adult medical examination without abnormal findings: Secondary | ICD-10-CM | POA: Diagnosis not present

## 2022-12-17 NOTE — Patient Instructions (Signed)
[name] [name] , Thank you for taking time to come for your Medicare Wellness Visit. I appreciate your ongoing commitment to your health goals. Please review the following plan we discussed and let me know if I can assist you in the future.   These are the goals we discussed:  Goals      Manage My Medicine     Timeframe:  Long-Range Goal Priority:  High Start Date:    06/19/21                         Expected End Date:   12/20/21                    Follow Up Date 09/26/21    - call for medicine refill 2 or 3 days before it runs out - keep a list of all the medicines I take; vitamins and herbals too - use a pillbox to sort medicine    Why is this important?   These steps will help you keep on track with your medicines.   Notes:      Patient Stated     To be able to manage stress better      Patient Stated     More strength in my body         This is a list of the screening recommended for you and due dates:  Health Maintenance  Topic Date Due   DTaP/Tdap/Td vaccine (1 - Tdap) Never done   Zoster (Shingles) Vaccine (1 of 2) Never done   Colon Cancer Screening  06/29/2023*   Mammogram  07/12/2023   Medicare Annual Wellness Visit  12/18/2023   Pneumonia Vaccine  Completed   Flu Shot  Completed   DEXA scan (bone density measurement)  Completed   Hepatitis C Screening: USPSTF Recommendation to screen - Ages 33-79 yo.  Completed   HPV Vaccine  Aged Out   COVID-19 Vaccine  Discontinued  *Topic was postponed. The date shown is not the original due date.    Advanced directives: Advance directive discussed with you today. I have provided a copy for you to complete at home and have notarized. Once this is complete please bring a copy in to our office so we can scan it into your chart.  Conditions/risks identified: more strength in my body   Next appointment: Follow up in one year for your annual wellness visit    Preventive Care 65 Years and Older, Female Preventive care  refers to lifestyle choices and visits with your health care provider that can promote health and wellness. What does preventive care include? A yearly physical exam. This is also called an annual well check. Dental exams once or twice a year. Routine eye exams. Ask your health care provider how often you should have your eyes checked. Personal lifestyle choices, including: Daily care of your teeth and gums. Regular physical activity. Eating a healthy diet. Avoiding tobacco and drug use. Limiting alcohol use. Practicing safe sex. Taking low-dose aspirin every day. Taking vitamin and mineral supplements as recommended by your health care provider. What happens during an annual well check? The services and screenings done by your health care provider during your annual well check will depend on your age, overall health, lifestyle risk factors, and family history of disease. Counseling  Your health care provider may ask you questions about your: Alcohol use. Tobacco use. Drug use. Emotional well-being. Home and relationship well-being. Sexual activity.  Eating habits. History of falls. Memory and ability to understand (cognition). Work and work Statistician. Reproductive health. Screening  You may have the following tests or measurements: Height, weight, and BMI. Blood pressure. Lipid and cholesterol levels. These may be checked every 5 years, or more frequently if you are over 19 years old. Skin check. Lung cancer screening. You may have this screening every year starting at age 83 if you have a 30-pack-year history of smoking and currently smoke or have quit within the past 15 years. Fecal occult blood test (FOBT) of the stool. You may have this test every year starting at age 17. Flexible sigmoidoscopy or colonoscopy. You may have a sigmoidoscopy every 5 years or a colonoscopy every 10 years starting at age 51. Hepatitis C blood test. Hepatitis B blood test. Sexually transmitted  disease (STD) testing. Diabetes screening. This is done by checking your blood sugar (glucose) after you have not eaten for a while (fasting). You may have this done every 1-3 years. Bone density scan. This is done to screen for osteoporosis. You may have this done starting at age 28. Mammogram. This may be done every 1-2 years. Talk to your health care provider about how often you should have regular mammograms. Talk with your health care provider about your test results, treatment options, and if necessary, the need for more tests. Vaccines  Your health care provider may recommend certain vaccines, such as: Influenza vaccine. This is recommended every year. Tetanus, diphtheria, and acellular pertussis (Tdap, Td) vaccine. You may need a Td booster every 10 years. Zoster vaccine. You may need this after age 37. Pneumococcal 13-valent conjugate (PCV13) vaccine. One dose is recommended after age 26. Pneumococcal polysaccharide (PPSV23) vaccine. One dose is recommended after age 4. Talk to your health care provider about which screenings and vaccines you need and how often you need them. This information is not intended to replace advice given to you by your health care provider. Make sure you discuss any questions you have with your health care provider. Document Released: 11/10/2015 Document Revised: 07/03/2016 Document Reviewed: 08/15/2015 Elsevier Interactive Patient Education  2017 [employer] Prevention in the Home Falls can cause injuries. They can happen to people of all ages. There are many things you can do to make your home safe and to help prevent falls. What can I do on the outside of my home? Regularly fix the edges of walkways and driveways and fix any cracks. Remove anything that might make you trip as you walk through a door, such as a raised step or threshold. Trim any bushes or trees on the path to your home. Use bright outdoor lighting. Clear any walking paths of  anything that might make someone trip, such as rocks or tools. Regularly check to see if handrails are loose or broken. Make sure that both sides of any steps have handrails. Any raised decks and porches should have guardrails on the edges. Have any leaves, snow, or ice cleared regularly. Use sand or salt on walking paths during winter. Clean up any spills in your garage right away. This includes oil or grease spills. What can I do in the bathroom? Use night lights. Install grab bars by the toilet and in the tub and shower. Do not use towel bars as grab bars. Use non-skid mats or decals in the tub or shower. If you need to sit down in the shower, use a plastic, non-slip stool. Keep the floor dry. Clean up any water  that spills on the floor as soon as it happens. Remove soap buildup in the tub or shower regularly. Attach bath mats securely with double-sided non-slip rug tape. Do not have throw rugs and other things on the floor that can make you trip. What can I do in the bedroom? Use night lights. Make sure that you have a light by your bed that is easy to reach. Do not use any sheets or blankets that are too big for your bed. They should not hang down onto the floor. Have a firm chair that has side arms. You can use this for support while you get dressed. Do not have throw rugs and other things on the floor that can make you trip. What can I do in the kitchen? Clean up any spills right away. Avoid walking on wet floors. Keep items that you use a lot in easy-to-reach places. If you need to reach something above you, use a strong step stool that has a grab bar. Keep electrical cords out of the way. Do not use floor polish or wax that makes floors slippery. If you must use wax, use non-skid floor wax. Do not have throw rugs and other things on the floor that can make you trip. What can I do with my stairs? Do not leave any items on the stairs. Make sure that there are handrails on both  sides of the stairs and use them. Fix handrails that are broken or loose. Make sure that handrails are as long as the stairways. Check any carpeting to make sure that it is firmly attached to the stairs. Fix any carpet that is loose or worn. Avoid having throw rugs at the top or bottom of the stairs. If you do have throw rugs, attach them to the floor with carpet tape. Make sure that you have a light switch at the top of the stairs and the bottom of the stairs. If you do not have them, ask someone to add them for you. What else can I do to help prevent falls? Wear shoes that: Do not have high heels. Have rubber bottoms. Are comfortable and fit you well. Are closed at the toe. Do not wear sandals. If you use a stepladder: Make sure that it is fully opened. Do not climb a closed stepladder. Make sure that both sides of the stepladder are locked into place. Ask someone to hold it for you, if possible. Clearly mark and make sure that you can see: Any grab bars or handrails. First and last steps. Where the edge of each step is. Use tools that help you move around (mobility aids) if they are needed. These include: Canes. Walkers. Scooters. Crutches. Turn on the lights when you go into a dark area. Replace any light bulbs as soon as they burn out. Set up your furniture so you have a clear path. Avoid moving your furniture around. If any of your floors are uneven, fix them. If there are any pets around you, be aware of where they are. Review your medicines with your doctor. Some medicines can make you feel dizzy. This can increase your chance of falling. Ask your doctor what other things that you can do to help prevent falls. This information is not intended to replace advice given to you by your health care provider. Make sure you discuss any questions you have with your health care provider. Document Released: 08/10/2009 Document Revised: 03/21/2016 Document Reviewed: 11/18/2014 Elsevier  Interactive Patient Education  2017 [employer].

## 2022-12-17 NOTE — Progress Notes (Signed)
I connected with  [name] [name] on 12/17/22 by a audio enabled telemedicine application and verified that I am speaking with the correct person using two identifiers.  Patient Location: Home  Provider Location: Office/Clinic  I discussed the limitations of evaluation and management by telemedicine. The patient expressed understanding and agreed to proceed.   Subjective:   [name] [name] is a 72 y.o. female who presents for Medicare Annual (Subsequent) preventive examination.  Review of Systems     Cardiac Risk Factors include: advanced age (>34mn, >>68women);sedentary lifestyle     Objective:    There were no vitals filed for this visit. There is no height or weight on file to calculate BMI.     12/17/2022   11:46 AM 04/17/2022    5:12 PM 01/17/2022    2:07 PM 12/14/2021    3:15 PM 01/31/2020    8:32 PM 09/21/2019    4:20 PM 06/03/2018   11:36 AM  Advanced Directives  Does Patient Have a Medical Advance Directive? No No No No No No No  Would patient like information on creating a medical advance directive? No - Patient declined Yes (ED - Information included in AVS) No - Patient declined No - Patient declined No - Patient declined Yes (MAU/Ambulatory/Procedural Areas - Information given)     Current Medications (verified) Outpatient Encounter Medications as of 12/17/2022  Medication Sig   Amino Acids-Protein Hydrolys (FEEDING SUPPLEMENT, PRO-STAT 64,) LIQD Take 30 mLs by mouth 3 (three) times daily with meals.   Ascorbic Acid (VITAMIN C PO) Take 1 tablet by mouth daily.   b complex vitamins capsule Take 1 capsule by mouth daily.   Cholecalciferol (VITAMIN D) 2000 UNITS CAPS Take 2,000 Units by mouth daily.   diazepam (VALIUM) 5 MG tablet Take 0.5-1 tablets (2.5-5 mg total) by mouth every 12 (twelve) hours as needed for anxiety.   feeding supplement (ENSURE ENLIVE / ENSURE PLUS) LIQD Take 237 mLs by mouth 3 (three) times daily between meals.   glucosamine-chondroitin 500-400 MG  tablet Take 1 tablet by mouth daily.   hydrocortisone cream 1 % Apply topically daily.   ibuprofen (ADVIL) 200 MG tablet Take 200 mg by mouth every 6 (six) hours as needed.   lidocaine (LIDODERM) 5 % Place 1 patch onto the skin daily. Remove & Discard patch within 12 hours or as directed by MD   magnesium 30 MG tablet Take 30 mg by mouth 2 (two) times daily.   Multiple Vitamin (MULTIVITAMIN) capsule Take 1 capsule by mouth daily.   Omega-3 Fatty Acids (OMEGA 3 PO) Take 1 tablet by mouth daily.   pantoprazole (PROTONIX) 40 MG tablet Take 1 tablet (40 mg total) by mouth daily.   tetrahydrozoline-zinc (VISINE-AC) 0.05-0.25 % ophthalmic solution Place 2 drops into both eyes 4 (four) times daily as needed (allergies).   vitamin B-12 (CYANOCOBALAMIN) 1000 MCG tablet Take 1,000 mcg by mouth daily.   carbidopa-levodopa (SINEMET IR) 25-250 MG tablet Take 1 tablet by mouth 4 (four) times daily.   [DISCONTINUED] acetaminophen (TYLENOL) 160 MG/5ML solution Take 20.3 mLs (650 mg total) by mouth every 6 (six) hours as needed for mild pain.   [DISCONTINUED] amantadine (SYMMETREL) 100 MG capsule Take 1 capsule (100 mg total) by mouth 2 (two) times daily. (Patient not taking: Reported on 09/21/2019)   No facility-administered encounter medications on file as of 12/17/2022.    Allergies (verified) Sulfa antibiotics   History: Past Medical History:  Diagnosis Date   Fibroid  GERD (gastroesophageal reflux disease)    Muscle weakness of lower extremity 01/17/2015   Osteopenia    Parkinson disease 01/17/2015   Past Surgical History:  Procedure Laterality Date   FINGER SURGERY     INGUINAL HERNIA REPAIR Left 01/17/2022   Procedure: Left femoral hernia repair with mesh and small bowel resection and anastomosis;  Surgeon: Kinsinger, Arta Bruce, MD;  Location: [place];  Service: General;  Laterality: Left;   MOUTH SURGERY     TOE SURGERY     TONSILLECTOMY     TUBAL LIGATION     x2   Family History   Problem Relation Age of Onset   Rheum arthritis Mother    Breast cancer Mother        69's   Cancer Father        throat   Breast cancer Sister        56's   Hypertension Brother    Hyperlipidemia Brother    Heart attack Son        Deceased, 33   Healthy Daughter    Parkinsonism Neg Hx    Social History   Socioeconomic History   Marital status: Divorced    Spouse name: Not on file   Number of children: 4   Years of education: B.A.   Highest education level: Not on file  Occupational History   Occupation: SECRETARY  Tobacco Use   Smoking status: Never   Smokeless tobacco: Never  Vaping Use   Vaping Use: Never used  Substance and Sexual Activity   Alcohol use: No    Alcohol/week: 0.0 standard drinks of alcohol   Drug use: No   Sexual activity: Never  Other Topics Concern   Not on file  Social History Narrative   Patient is right handed.   Patient drinks 2-3 cups of caffeine per day.   Lives alone in a 2 story home.  Has 4 living children.  1 passed away at age 38.     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   [employer] level of education: college   Social Determinants of Radio broadcast assistant Strain: [place]  (12/17/2022)   Overall Financial Resource Strain (CARDIA)    Difficulty of Paying Living Expenses: Not hard at all  Food Insecurity: No Food Insecurity (12/17/2022)   Hunger Vital Sign    Worried About Running Out of Food in the Last Year: Never true    [place] in the Last Year: Never true  Transportation Needs: No Transportation Needs (12/17/2022)   PRAPARE - Hydrologist (Medical): No    Lack of Transportation (Non-Medical): No  Physical Activity: Insufficiently Active (12/17/2022)   Exercise Vital Sign    Days of Exercise per Week: 5 days    Minutes of Exercise per Session: 20 min  Stress: No Stress Concern Present (12/17/2022)   [place]    Feeling of Stress : Only a little  Social Connections: Moderately Isolated (12/17/2022)   Social Connection and Isolation Panel [NHANES]    Frequency of Communication with Friends and Family: More than three times a week    Frequency of Social Gatherings with Friends and Family: Twice a week    Attends Religious Services: 1 to 4 times per year    Active Member of [employer] or Organizations: No    Attends Archivist Meetings: Never  Marital Status: Divorced    Tobacco Counseling Counseling given: Not Answered   Clinical Intake:  Pre-visit preparation completed: Yes  Pain : No/denies pain     BMI - recorded: 15.51 Nutritional Status: BMI <19  Underweight Nutritional Risks: None Diabetes: No  How often do you need to have someone help you when you read instructions, pamphlets, or other written materials from your doctor or pharmacy?: 1 - Never  Diabetic?no  Interpreter Needed?: No  Information entered by :: Charlott Rakes, LPN   Activities of Daily Living    12/17/2022   11:46 AM 01/18/2022    3:00 AM  In your present state of health, do you have any difficulty performing the following activities:  Hearing? 0 0  Vision? 0 0  Difficulty concentrating or making decisions? 0 0  Walking or climbing stairs? 1 0  Comment avoid stairs   Dressing or bathing? 0 0  Comment slowly   Doing errands, shopping? 0 1  Preparing Food and eating ? Y   Comment family cooking   Using the Toilet? N   In the past six months, have you accidently leaked urine? N   Do you have problems with loss of bowel control? N   Managing your Medications? N   Managing your Finances? N   Housekeeping or managing your Housekeeping? N     Patient Care Team: Vivi Barrack, MD as PCP - General (Family Medicine) Kathrynn Ducking, MD (Inactive) as Consulting Physician (Neurology) Volanda Napoleon, MD as Consulting Physician (Oncology) Edythe Clarity, [facility]  (Pharmacist)  Indicate any recent Medical Services you may have received from other than [facility] providers in the past year (date may be approximate).     Assessment:   This is a routine wellness examination for [place].  Hearing/Vision screen Hearing Screening - Comments:: Pt denies any hearing issues Vision Screening - Comments:: Pt follows up with dr Macarthur Critchley for annual eye exams   Dietary issues and exercise activities discussed: Current Exercise Habits: Home exercise routine, Type of exercise: Other - see comments, Time (Minutes): 20, Frequency (Times/Week): 5, Weekly Exercise (Minutes/Week): 100   Goals Addressed             This Visit's Progress    Patient Stated       More strength in my body        Depression Screen    12/17/2022   11:43 AM 12/10/2022    1:35 PM 05/21/2022    2:27 PM 03/19/2022    2:20 PM 12/14/2021    3:09 PM 08/23/2021    3:35 PM 08/17/2021    3:28 PM  PHQ 2/9 Scores  PHQ - 2 Score 0 0 0 0 0 0 0    Fall Risk    12/17/2022   11:46 AM 12/10/2022    1:35 PM 06/28/2022    2:37 PM 05/21/2022    2:27 PM 03/19/2022    2:20 PM  [place] in the past year? 0 0 0 0 0  Number falls in past yr: 0 0 0 0 0  Injury with Fall? 0 0 0 0 0  Risk for fall due to : Impaired vision;Impaired balance/gait;Impaired mobility No Fall Risks;Impaired balance/gait No Fall Risks No Fall Risks No Fall Risks  Follow up Falls prevention discussed  Falls evaluation completed      FALL RISK PREVENTION PERTAINING TO THE HOME:  Any stairs in or around the home? Yes  If so,  are there any without handrails? No  Home free of loose throw rugs in walkways, pet beds, electrical cords, etc? Yes  Adequate lighting in your home to reduce risk of falls? Yes   ASSISTIVE DEVICES UTILIZED TO PREVENT FALLS:  Life alert? No  Use of a cane, walker or w/c? Yes  Grab bars in the bathroom? Yes  Shower chair or bench in shower? Yes  Elevated toilet seat or a handicapped toilet? Yes    TIMED UP AND GO:  Was the test performed? No .   Cognitive Function:        12/17/2022   11:49 AM 12/14/2021    3:19 PM 09/21/2019    4:21 PM  6CIT Screen  What Year? 0 points 0 points 0 points  What month? 0 points 0 points 0 points  What time? 0 points 0 points 0 points  Count back from 20 0 points 0 points 0 points  Months in reverse 2 points 0 points 0 points  Repeat phrase 2 points 4 points 0 points  Total Score 4 points 4 points 0 points    Immunizations Immunization History  Administered Date(s) Administered   Fluad Quad(high Dose 65+) 08/05/2019, 09/25/2021, 12/10/2022   PNEUMOCOCCAL CONJUGATE-20 09/25/2021    TDAP status: Due, Education has been provided regarding the importance of this vaccine. Advised may receive this vaccine at local pharmacy or Health Dept. Aware to provide a copy of the vaccination record if obtained from local pharmacy or Health Dept. Verbalized acceptance and understanding.  Flu Vaccine status: Up to date  Pneumococcal vaccine status: Up to date  Covid-19 vaccine status: Declined, Education has been provided regarding the importance of this vaccine but patient still declined. Advised may receive this vaccine at local pharmacy or Health Dept.or vaccine clinic. Aware to provide a copy of the vaccination record if obtained from local pharmacy or Health Dept. Verbalized acceptance and understanding.  Qualifies for Shingles Vaccine? Yes   Zostavax completed No   Shingrix Completed?: No.    Education has been provided regarding the importance of this vaccine. Patient has been advised to call insurance company to determine out of pocket expense if they have not yet received this vaccine. Advised may also receive vaccine at local pharmacy or Health Dept. Verbalized acceptance and understanding.  Screening Tests Health Maintenance  Topic Date Due   DTaP/Tdap/Td (1 - Tdap) Never done   Zoster Vaccines- Shingrix (1 of 2) Never done   COLONOSCOPY  (Pts 45-42yr Insurance coverage will need to be confirmed)  06/29/2023 (Originally 01/10/2016)   MAMMOGRAM  07/12/2023   Medicare Annual Wellness (AWV)  12/18/2023   Pneumonia Vaccine 72 Years old  Completed   INFLUENZA VACCINE  Completed   DEXA SCAN  Completed   Hepatitis C Screening  Completed   HPV VACCINES  Aged Out   COVID-19 Vaccine  Discontinued    Health Maintenance  Health Maintenance Due  Topic Date Due   DTaP/Tdap/Td (1 - Tdap) Never done   Zoster Vaccines- Shingrix (1 of 2) Never done    Colorectal cancer screening: Type of screening: Colonoscopy. Completed 01/09/06. Repeat every 10 years pt postponed   Mammogram status: Completed 07/11/21. Repeat every year  Bone Density status: Completed 11/19/12. Results reflect: Bone density results: OSTEOPENIA. Repeat every 2 years. Additional Screening:  Hepatitis C Screening:  Completed 02/18/13  Vision Screening: Recommended annual ophthalmology exams for early detection of glaucoma and other disorders of the eye. Is the patient up to date with  their annual eye exam?  Yes  Who is the provider or what is the name of the office in which the patient attends annual eye exams? Dr Macarthur Critchley If pt is not established with a provider, would they like to be referred to a provider to establish care? No .   Dental Screening: Recommended annual dental exams for proper oral hygiene  Community Resource Referral / Chronic Care Management: CRR required this visit?  No   CCM required this visit?  No      Plan:     I have personally reviewed and noted the following in the patient's chart:   Medical and social history Use of alcohol, tobacco or illicit drugs  Current medications and supplements including opioid prescriptions. Patient is not currently taking opioid prescriptions. Functional ability and status Nutritional status Physical activity Advanced directives List of other physicians Hospitalizations, surgeries, and ER visits  in previous 12 months Vitals Screenings to include cognitive, depression, and falls Referrals and appointments  In addition, I have reviewed and discussed with patient certain preventive protocols, quality metrics, and best practice recommendations. A written personalized care plan for preventive services as well as general preventive health recommendations were provided to patient.     Willette Brace, LPN   [id number]   Nurse Notes: none

## 2022-12-26 ENCOUNTER — Telehealth: Payer: Self-pay | Admitting: Pharmacist

## 2022-12-26 DIAGNOSIS — G20A1 Parkinson's disease without dyskinesia, without mention of fluctuations: Secondary | ICD-10-CM | POA: Diagnosis not present

## 2022-12-26 NOTE — Progress Notes (Signed)
Care Management & Coordination Services Pharmacy Team  Reason for Encounter: Medication coordination and delivery  Contacted patient to discuss medications and coordinate delivery from [facility]. Spoke with patient on 12/26/2022  Cycle dispensing form sent to [facility] for review.   Last adherence delivery date: 10/08/2022   Patient is due for next adherence delivery on: 01/07/2023  This delivery to include: Vials  90 Days  Pantoprazole 40 mg once daily Carbidopa-Levodopa 25-100 mg three times daily Melatonin 3 mg at bedtime  No refill request needed.  Confirmed delivery date of 01/07/2023, advised patient that pharmacy will contact them the morning of delivery.   Any concerns about your medications? No  How often do you forget or accidentally miss a dose? Never  Do you use a pillbox? No  Is patient in packaging No  If yes  What is the date on your next pill pack?  Any concerns or issues with your packaging?   Chart review: Recent office visits:  12/10/2022 [facility] (PCP) Vivi Barrack, MD; She can continue taking her Valium 2.5 to 5 mg twice daily as needed.   Recent consult visits:  None since last PharmD visit  Hospital visits:  None in previous 6 months  Medications: Outpatient Encounter Medications as of 12/26/2022  Medication Sig   Amino Acids-Protein Hydrolys (FEEDING SUPPLEMENT, PRO-STAT 64,) LIQD Take 30 mLs by mouth 3 (three) times daily with meals.   Ascorbic Acid (VITAMIN C PO) Take 1 tablet by mouth daily.   b complex vitamins capsule Take 1 capsule by mouth daily.   carbidopa-levodopa (SINEMET IR) 25-250 MG tablet Take 1 tablet by mouth 4 (four) times daily.   Cholecalciferol (VITAMIN D) 2000 UNITS CAPS Take 2,000 Units by mouth daily.   diazepam (VALIUM) 5 MG tablet Take 0.5-1 tablets (2.5-5 mg total) by mouth every 12 (twelve) hours as needed for anxiety.   feeding supplement (ENSURE ENLIVE / ENSURE PLUS) LIQD Take 237 mLs by mouth 3 (three)  times daily between meals.   glucosamine-chondroitin 500-400 MG tablet Take 1 tablet by mouth daily.   hydrocortisone cream 1 % Apply topically daily.   ibuprofen (ADVIL) 200 MG tablet Take 200 mg by mouth every 6 (six) hours as needed.   lidocaine (LIDODERM) 5 % Place 1 patch onto the skin daily. Remove & Discard patch within 12 hours or as directed by MD   magnesium 30 MG tablet Take 30 mg by mouth 2 (two) times daily.   Multiple Vitamin (MULTIVITAMIN) capsule Take 1 capsule by mouth daily.   Omega-3 Fatty Acids (OMEGA 3 PO) Take 1 tablet by mouth daily.   pantoprazole (PROTONIX) 40 MG tablet Take 1 tablet (40 mg total) by mouth daily.   tetrahydrozoline-zinc (VISINE-AC) 0.05-0.25 % ophthalmic solution Place 2 drops into both eyes 4 (four) times daily as needed (allergies).   vitamin B-12 (CYANOCOBALAMIN) 1000 MCG tablet Take 1,000 mcg by mouth daily.   [DISCONTINUED] amantadine (SYMMETREL) 100 MG capsule Take 1 capsule (100 mg total) by mouth 2 (two) times daily. (Patient not taking: Reported on 09/21/2019)   No facility-administered encounter medications on file as of 12/26/2022.   BP Readings from Last 3 Encounters:  12/10/22 136/82  06/28/22 117/73  04/17/22 108/78    Pulse Readings from Last 3 Encounters:  12/10/22 (!) 106  06/28/22 (!) 44  04/17/22 94    Lab Results  Component Value Date/Time   HGBA1C 5.4 01/19/2022 04:36 AM   HGBA1C 5.8 09/25/2021 03:20 PM   Lab  Results  Component Value Date   CREATININE 0.53 04/17/2022   BUN 16 04/17/2022   GFR 87.51 09/25/2021   GFRNONAA >60 04/17/2022   GFRAA >60 01/31/2020   NA 139 04/17/2022   K 3.9 04/17/2022   CALCIUM 9.7 04/17/2022   CO2 28 04/17/2022     Future Appointments  Date Time Provider [place]  01/01/2024  1:00 PM [place]   April D Calhoun, [place] Pharmacist Assistant [phone]

## 2023-01-07 ENCOUNTER — Telehealth: Payer: Self-pay | Admitting: Family Medicine

## 2023-01-07 DIAGNOSIS — I517 Cardiomegaly: Secondary | ICD-10-CM | POA: Diagnosis not present

## 2023-01-07 DIAGNOSIS — G20C Parkinsonism, unspecified: Secondary | ICD-10-CM | POA: Diagnosis not present

## 2023-01-07 DIAGNOSIS — R911 Solitary pulmonary nodule: Secondary | ICD-10-CM | POA: Diagnosis not present

## 2023-01-07 DIAGNOSIS — F411 Generalized anxiety disorder: Secondary | ICD-10-CM | POA: Diagnosis not present

## 2023-01-07 DIAGNOSIS — K5900 Constipation, unspecified: Secondary | ICD-10-CM | POA: Diagnosis not present

## 2023-01-07 DIAGNOSIS — E46 Unspecified protein-calorie malnutrition: Secondary | ICD-10-CM | POA: Diagnosis not present

## 2023-01-07 DIAGNOSIS — M1990 Unspecified osteoarthritis, unspecified site: Secondary | ICD-10-CM | POA: Diagnosis not present

## 2023-01-07 DIAGNOSIS — M8580 Other specified disorders of bone density and structure, unspecified site: Secondary | ICD-10-CM | POA: Diagnosis not present

## 2023-01-07 DIAGNOSIS — M5030 Other cervical disc degeneration, unspecified cervical region: Secondary | ICD-10-CM | POA: Diagnosis not present

## 2023-01-07 NOTE — Telephone Encounter (Signed)
Please advise 

## 2023-01-07 NOTE — Telephone Encounter (Signed)
Pt's daughter is wanting a new home health referral. She states current provider has been late & not communicating with them. Please advise

## 2023-01-07 NOTE — Telephone Encounter (Signed)
Patient daughter states to disregard previous message. Patient does not want to change [facility] Providers.

## 2023-01-09 ENCOUNTER — Telehealth: Payer: Self-pay | Admitting: Family Medicine

## 2023-01-09 DIAGNOSIS — E46 Unspecified protein-calorie malnutrition: Secondary | ICD-10-CM | POA: Diagnosis not present

## 2023-01-09 DIAGNOSIS — I517 Cardiomegaly: Secondary | ICD-10-CM | POA: Diagnosis not present

## 2023-01-09 DIAGNOSIS — G20C Parkinsonism, unspecified: Secondary | ICD-10-CM | POA: Diagnosis not present

## 2023-01-09 DIAGNOSIS — M5030 Other cervical disc degeneration, unspecified cervical region: Secondary | ICD-10-CM | POA: Diagnosis not present

## 2023-01-09 DIAGNOSIS — M8580 Other specified disorders of bone density and structure, unspecified site: Secondary | ICD-10-CM | POA: Diagnosis not present

## 2023-01-09 DIAGNOSIS — M1990 Unspecified osteoarthritis, unspecified site: Secondary | ICD-10-CM | POA: Diagnosis not present

## 2023-01-09 DIAGNOSIS — K5900 Constipation, unspecified: Secondary | ICD-10-CM | POA: Diagnosis not present

## 2023-01-09 DIAGNOSIS — R911 Solitary pulmonary nodule: Secondary | ICD-10-CM | POA: Diagnosis not present

## 2023-01-09 DIAGNOSIS — F411 Generalized anxiety disorder: Secondary | ICD-10-CM | POA: Diagnosis not present

## 2023-01-09 NOTE — Telephone Encounter (Signed)
Ok with me. Please place any necessary orders. 

## 2023-01-09 NOTE — Telephone Encounter (Signed)
Called [phone] -Speech Therapist left VO on voice mail

## 2023-01-09 NOTE — Telephone Encounter (Signed)
Ok to give VO? 

## 2023-01-09 NOTE — Telephone Encounter (Signed)
Home Health Verbal Orders  Agency:  [facility] [facility]   Contact and title: Ph# [phone] -Speech Therapist  Requesting Speech Therapy:    Reason for Request:  Voice and Dysarthria from Parkinson's  Frequency:  1 x per week for 4 weeks  HH needs F2F w/in last 30 days

## 2023-01-13 ENCOUNTER — Emergency Department (HOSPITAL_COMMUNITY): Payer: Medicare HMO

## 2023-01-13 ENCOUNTER — Other Ambulatory Visit: Payer: Self-pay

## 2023-01-13 ENCOUNTER — Encounter (HOSPITAL_COMMUNITY): Payer: Self-pay

## 2023-01-13 ENCOUNTER — Emergency Department (HOSPITAL_COMMUNITY)
Admission: EM | Admit: 2023-01-13 | Discharge: 2023-01-13 | Disposition: A | Discharge status: Home / Self Care | Payer: Medicare HMO | Attending: Emergency Medicine | Admitting: Emergency Medicine

## 2023-01-13 DIAGNOSIS — R9431 Abnormal electrocardiogram [ECG] [EKG]: Secondary | ICD-10-CM | POA: Diagnosis not present

## 2023-01-13 DIAGNOSIS — R197 Diarrhea, unspecified: Secondary | ICD-10-CM | POA: Diagnosis not present

## 2023-01-13 DIAGNOSIS — M79669 Pain in unspecified lower leg: Secondary | ICD-10-CM | POA: Diagnosis not present

## 2023-01-13 DIAGNOSIS — R001 Bradycardia, unspecified: Secondary | ICD-10-CM | POA: Diagnosis not present

## 2023-01-13 DIAGNOSIS — Z1152 Encounter for screening for COVID-19: Secondary | ICD-10-CM | POA: Diagnosis not present

## 2023-01-13 DIAGNOSIS — R109 Unspecified abdominal pain: Secondary | ICD-10-CM | POA: Diagnosis not present

## 2023-01-13 DIAGNOSIS — N281 Cyst of kidney, acquired: Secondary | ICD-10-CM | POA: Diagnosis not present

## 2023-01-13 LAB — COMPREHENSIVE METABOLIC PANEL
ALT: 13 U/L (ref 0–44)
AST: 14 U/L — ABNORMAL LOW (ref 15–41)
Albumin: 4 g/dL (ref 3.5–5.0)
Alkaline Phosphatase: 46 U/L (ref 38–126)
Anion gap: 7 (ref 5–15)
BUN: 16 mg/dL (ref 8–23)
CO2: 23 mmol/L (ref 22–32)
Calcium: 9.2 mg/dL (ref 8.9–10.3)
Chloride: 106 mmol/L (ref 98–111)
Creatinine, Ser: 0.61 mg/dL (ref 0.44–1.00)
GFR, Estimated: >60 mL/min (ref >60)
Glucose, Bld: 82 mg/dL (ref 70–99)
Potassium: 3.5 mmol/L (ref 3.5–5.1)
Sodium: 136 mmol/L (ref 135–145)
Total Bilirubin: 0.7 mg/dL (ref 0.3–1.2)
Total Protein: 6.8 g/dL (ref 6.5–8.1)

## 2023-01-13 LAB — CBC WITH DIFFERENTIAL/PLATELET
Abs Immature Granulocytes: 0.02 10*3/uL (ref 0.00–0.07)
Basophils Absolute: 0 10*3/uL (ref 0.0–0.1)
Basophils Relative: 0 %
Eosinophils Absolute: 0 10*3/uL (ref 0.0–0.5)
Eosinophils Relative: 1 %
HCT: 38.6 % (ref 36.0–46.0)
Hemoglobin: 12.5 g/dL (ref 12.0–15.0)
Immature Granulocytes: 0 %
Lymphocytes Relative: 16 %
Lymphs Abs: 1.1 10*3/uL (ref 0.7–4.0)
MCH: 29.8 pg (ref 26.0–34.0)
MCHC: 32.4 g/dL (ref 30.0–36.0)
MCV: 91.9 fL (ref 80.0–100.0)
Monocytes Absolute: 0.4 10*3/uL (ref 0.1–1.0)
Monocytes Relative: 6 %
Neutro Abs: 5.1 10*3/uL (ref 1.7–7.7)
Neutrophils Relative %: 77 %
Platelets: 138 10*3/uL — ABNORMAL LOW (ref 150–400)
RBC: 4.2 MIL/uL (ref 3.87–5.11)
RDW: 12.4 % (ref 11.5–15.5)
WBC: 6.7 10*3/uL (ref 4.0–10.5)
nRBC: 0 % (ref 0.0–0.2)

## 2023-01-13 LAB — URINALYSIS, ROUTINE W REFLEX MICROSCOPIC
Bacteria, UA: NONE SEEN
Bilirubin Urine: NEGATIVE
Glucose, UA: NEGATIVE mg/dL
Hgb urine dipstick: NEGATIVE
Ketones, ur: 5 mg/dL — AB
Nitrite: NEGATIVE
Protein, ur: NEGATIVE mg/dL
Specific Gravity, Urine: 1.027 (ref 1.005–1.030)
pH: 7 (ref 5.0–8.0)

## 2023-01-13 LAB — LIPASE, BLOOD: Lipase: 28 U/L (ref 11–51)

## 2023-01-13 LAB — LACTIC ACID, PLASMA: Lactic Acid, Venous: 0.9 mmol/L (ref 0.5–1.9)

## 2023-01-13 LAB — RESP PANEL BY RT-PCR (RSV, FLU A&B, COVID)  RVPGX2
Influenza A by PCR: NEGATIVE
Influenza B by PCR: NEGATIVE
Resp Syncytial Virus by PCR: NEGATIVE
SARS Coronavirus 2 by RT PCR: NEGATIVE

## 2023-01-13 MED ORDER — SODIUM CHLORIDE 0.9 % IV BOLUS
500.0000 mL | Freq: Once | INTRAVENOUS | Status: AC
  Administered 2023-01-13: 500 mL via INTRAVENOUS

## 2023-01-13 MED ORDER — IOHEXOL 300 MG/ML  SOLN
80.0000 mL | Freq: Once | INTRAMUSCULAR | Status: AC | PRN
  Administered 2023-01-13: 80 mL via INTRAVENOUS

## 2023-01-13 NOTE — ED Triage Notes (Signed)
Pt coming from home with c/o pain in lower legs right before having bowel movement x5 days. Pt states that initially she had constipation for two weeks, then had diarrhea for 6-7 days. Patient states that her diarrhea finally became formed stools yesterday. Patient states that she is still having pain in her lower legs right before her bowel movements.

## 2023-01-13 NOTE — Discharge Instructions (Signed)
Return for any problem.  Follow-up closely with your regular care providers in the next 2 to 3 days.

## 2023-01-13 NOTE — ED Provider Notes (Signed)
[facility] Provider Note   CSN: [id number] Arrival date & time: 01/13/23  1618     History  No chief complaint on file.   [name] [name] is a 72 y.o. female.  72 year old female with prior medical history as detailed below presents for evaluation.  Patient reports that she had constipation for approximately 2 weeks.  She took medications at home to treat her constipation.  Patient reports that subsequently she has had near constant diarrhea for the last 6 to 7 days.  Patient reports that she has had 1-2 formed stools over the last 24 hours.  Patient is concerned about possible dehydration or infection.  The history is provided by the patient and medical records.       Home Medications Prior to Admission medications   Medication Sig Start Date End Date Taking? Authorizing Provider  Amino Acids-Protein Hydrolys (FEEDING SUPPLEMENT, PRO-STAT 64,) LIQD Take 30 mLs by mouth 3 (three) times daily with meals. 03/19/22   Vivi Barrack, MD  Ascorbic Acid (VITAMIN C PO) Take 1 tablet by mouth daily.    [provider]  b complex vitamins capsule Take 1 capsule by mouth daily.    [provider]  carbidopa-levodopa (SINEMET IR) 25-250 MG tablet Take 1 tablet by mouth 4 (four) times daily. 02/11/22 03/13/22  Barb Merino, MD  Cholecalciferol (VITAMIN D) 2000 UNITS CAPS Take 2,000 Units by mouth daily.    [provider]  diazepam (VALIUM) 5 MG tablet Take 0.5-1 tablets (2.5-5 mg total) by mouth every 12 (twelve) hours as needed for anxiety. 11/21/22   Vivi Barrack, MD  feeding supplement (ENSURE ENLIVE / ENSURE PLUS) LIQD Take 237 mLs by mouth 3 (three) times daily between meals. 02/14/22   Meuth, Blaine Hamper, PA-C  glucosamine-chondroitin 500-400 MG tablet Take 1 tablet by mouth daily.    [provider]  hydrocortisone cream 1 % Apply topically daily. 02/15/22   Meuth, Brooke A, PA-C  ibuprofen (ADVIL) 200  MG tablet Take 200 mg by mouth every 6 (six) hours as needed.    [provider]  lidocaine (LIDODERM) 5 % Place 1 patch onto the skin daily. Remove & Discard patch within 12 hours or as directed by MD 02/15/22   Ileene Rubens, Blaine Hamper, PA-C  magnesium 30 MG tablet Take 30 mg by mouth 2 (two) times daily.    [provider]  Multiple Vitamin (MULTIVITAMIN) capsule Take 1 capsule by mouth daily.    [provider]  Omega-3 Fatty Acids (OMEGA 3 PO) Take 1 tablet by mouth daily.    [provider]  pantoprazole (PROTONIX) 40 MG tablet Take 1 tablet (40 mg total) by mouth daily. 09/30/22 03/29/23  Vivi Barrack, MD  tetrahydrozoline-zinc (VISINE-AC) 0.05-0.25 % ophthalmic solution Place 2 drops into both eyes 4 (four) times daily as needed (allergies).    [provider]  vitamin B-12 (CYANOCOBALAMIN) 1000 MCG tablet Take 1,000 mcg by mouth daily.    [provider]  amantadine (SYMMETREL) 100 MG capsule Take 1 capsule (100 mg total) by mouth 2 (two) times daily. Patient not taking: Reported on 09/21/2019 08/05/19 02/01/20  Vivi Barrack, MD      Allergies    Sulfa antibiotics    Review of Systems   Review of Systems  All other systems reviewed and are negative.   Physical Exam Updated Vital Signs There were no vitals taken for this visit. Physical Exam Vitals  and nursing note reviewed.  Constitutional:      General: She is not in acute distress.    Appearance: Normal appearance. She is well-developed.  HENT:     Head: Normocephalic and atraumatic.  Eyes:     Conjunctiva/sclera: Conjunctivae normal.     Pupils: Pupils are equal, round, and reactive to light.  Cardiovascular:     Rate and Rhythm: Normal rate and regular rhythm.     Heart sounds: Normal heart sounds.  Pulmonary:     Effort: Pulmonary effort is normal. No respiratory distress.     Breath sounds: Normal breath sounds.  Abdominal:     General: There is no distension.      Palpations: Abdomen is soft.     Tenderness: There is no abdominal tenderness.  Musculoskeletal:        General: No deformity. Normal range of motion.     Cervical back: Normal range of motion and neck supple.  Skin:    General: Skin is warm and dry.  Neurological:     General: No focal deficit present.     Mental Status: She is alert and oriented to person, place, and time. Mental status is at baseline.     ED Results / Procedures / Treatments   Labs (all labs ordered are listed, but only abnormal results are displayed) Labs Reviewed  CULTURE, BLOOD (ROUTINE X 2)  CULTURE, BLOOD (ROUTINE X 2)  RESP PANEL BY RT-PCR (RSV, FLU A&B, COVID)  RVPGX2  CBC WITH DIFFERENTIAL/PLATELET  LIPASE, BLOOD  LACTIC ACID, PLASMA  LACTIC ACID, PLASMA  COMPREHENSIVE METABOLIC PANEL  URINALYSIS, ROUTINE W REFLEX MICROSCOPIC    EKG None  Radiology No results found.  Procedures Procedures    Medications Ordered in ED Medications  sodium chloride 0.9 % bolus 500 mL (has no administration in time range)    ED Course/ Medical Decision Making/ A&P                             Medical Decision Making Amount and/or Complexity of Data Reviewed Labs: ordered. Radiology: ordered.  Risk Prescription drug management.    Medical Screen Complete  This patient presented to the ED with complaint of Diarrhea.  This complaint involves an extensive number of treatment options. The initial differential diagnosis includes, but is not limited to, infection, metabolic abnormality, etc.  This presentation is: Acute, Chronic, Self-Limited, Previously Undiagnosed, Uncertain Prognosis, Complicated, and Systemic Symptoms  Patient is presenting with complaint of approximately 1 week of diarrhea.  Patient is primarily concerned about possible dehydration and/or infection.  Screening labs obtained are without significant abnormality.  Imaging obtained is also without significant  abnormality.  Patient reassured by workup.  She understands need for close outpatient follow-up.  Patient understands appropriate management for her constipation and diarrhea in the home.  She understands need for close outpatient follow-up.  Strict return precautions given understood. Additional history obtained:  External records from outside sources obtained and reviewed including prior ED visits and prior Inpatient records.    Lab Tests:  I ordered and personally interpreted labs.  The pertinent results include: CBC, CMP, COVID, flu, UA, lipase   Imaging Studies ordered:  I ordered imaging studies including CT abdomen pelvis I independently visualized and interpreted obtained imaging which showed NAD I agree with the radiologist interpretation.   Cardiac Monitoring:  The patient was maintained on a cardiac monitor.  I personally viewed and interpreted the cardiac  monitor which showed an underlying rhythm of: NSR   Problem List / ED Course:  Diarrhea    Reevaluation:  After the interventions noted above, I reevaluated the patient and found that they have: improved   Disposition:  After consideration of the diagnostic results and the patients response to treatment, I feel that the patent would benefit from close outpatient followup.          Final Clinical Impression(s) / ED Diagnoses Final diagnoses:  Diarrhea, unspecified type    Rx / DC Orders ED Discharge Orders     None         Valarie Merino, MD 01/13/23 2320

## 2023-01-15 ENCOUNTER — Telehealth: Payer: Self-pay | Admitting: Family Medicine

## 2023-01-15 NOTE — Telephone Encounter (Signed)
.  Home Health Certification or Plan of Care Tracking  Is this a Certification or Plan of Care? yes  [facility] Agency: [facility]  Order Number:  [id number]  Has charge sheet been attached? Yes  Where has form been placed:  In provider's box  Faxed to:   [phone]

## 2023-01-16 NOTE — Telephone Encounter (Signed)
Form faxed to [phone]

## 2023-01-16 NOTE — Telephone Encounter (Signed)
Form placed on PCP desk for reviewed

## 2023-01-18 LAB — CULTURE, BLOOD (ROUTINE X 2)
Culture: NO GROWTH
Culture: NO GROWTH
Special Requests: ADEQUATE

## 2023-01-20 ENCOUNTER — Telehealth: Payer: Self-pay

## 2023-01-20 NOTE — Telephone Encounter (Signed)
     Patient  visit on 3/18  at [facility] you been able to follow up with your primary care physician? No   The patient was or was not able to obtain any needed medicine or equipment. Yes   Are there diet recommendations that you are having difficulty following? Na   Patient expresses understanding of discharge instructions and education provided has no other needs at this time. Yes      [facility] [phone] [address], [place], [place] [address] Phone: [phone] Email: [email or web address]

## 2023-01-21 DIAGNOSIS — M8580 Other specified disorders of bone density and structure, unspecified site: Secondary | ICD-10-CM | POA: Diagnosis not present

## 2023-01-21 DIAGNOSIS — M5030 Other cervical disc degeneration, unspecified cervical region: Secondary | ICD-10-CM | POA: Diagnosis not present

## 2023-01-21 DIAGNOSIS — G20C Parkinsonism, unspecified: Secondary | ICD-10-CM | POA: Diagnosis not present

## 2023-01-21 DIAGNOSIS — M1990 Unspecified osteoarthritis, unspecified site: Secondary | ICD-10-CM | POA: Diagnosis not present

## 2023-01-21 DIAGNOSIS — K5900 Constipation, unspecified: Secondary | ICD-10-CM | POA: Diagnosis not present

## 2023-01-21 DIAGNOSIS — R911 Solitary pulmonary nodule: Secondary | ICD-10-CM | POA: Diagnosis not present

## 2023-01-21 DIAGNOSIS — E46 Unspecified protein-calorie malnutrition: Secondary | ICD-10-CM | POA: Diagnosis not present

## 2023-01-21 DIAGNOSIS — I517 Cardiomegaly: Secondary | ICD-10-CM | POA: Diagnosis not present

## 2023-01-21 DIAGNOSIS — F411 Generalized anxiety disorder: Secondary | ICD-10-CM | POA: Diagnosis not present

## 2023-01-23 ENCOUNTER — Telehealth: Payer: Self-pay | Admitting: Family Medicine

## 2023-01-23 DIAGNOSIS — F411 Generalized anxiety disorder: Secondary | ICD-10-CM | POA: Diagnosis not present

## 2023-01-23 DIAGNOSIS — M1990 Unspecified osteoarthritis, unspecified site: Secondary | ICD-10-CM | POA: Diagnosis not present

## 2023-01-23 DIAGNOSIS — K5900 Constipation, unspecified: Secondary | ICD-10-CM | POA: Diagnosis not present

## 2023-01-23 DIAGNOSIS — G20C Parkinsonism, unspecified: Secondary | ICD-10-CM | POA: Diagnosis not present

## 2023-01-23 DIAGNOSIS — R911 Solitary pulmonary nodule: Secondary | ICD-10-CM | POA: Diagnosis not present

## 2023-01-23 DIAGNOSIS — E46 Unspecified protein-calorie malnutrition: Secondary | ICD-10-CM | POA: Diagnosis not present

## 2023-01-23 DIAGNOSIS — M8580 Other specified disorders of bone density and structure, unspecified site: Secondary | ICD-10-CM | POA: Diagnosis not present

## 2023-01-23 DIAGNOSIS — M5030 Other cervical disc degeneration, unspecified cervical region: Secondary | ICD-10-CM | POA: Diagnosis not present

## 2023-01-23 DIAGNOSIS — I517 Cardiomegaly: Secondary | ICD-10-CM | POA: Diagnosis not present

## 2023-01-23 NOTE — Telephone Encounter (Signed)
.  Home Health Verbal Orders  Agency:  [facility]  Caller: Lovena Le, Speech Therapist  [phone]  Requesting OT/ PT/ Skilled nursing/ Social Work/ Speech:  Skilled nursing evaluation  Reason for Request:  Pt experiencing increased weakness that she thinks to be caused by meds for parkinson's   Frequency:  1 x 1 week

## 2023-01-28 NOTE — Telephone Encounter (Signed)
LVM with VO request, if any question please give [place] a call

## 2023-01-28 NOTE — Telephone Encounter (Signed)
Ok to give VO? 

## 2023-01-28 NOTE — Telephone Encounter (Signed)
Ok with me. Please place any necessary orders. 

## 2023-01-30 DIAGNOSIS — F411 Generalized anxiety disorder: Secondary | ICD-10-CM | POA: Diagnosis not present

## 2023-01-30 DIAGNOSIS — G20C Parkinsonism, unspecified: Secondary | ICD-10-CM | POA: Diagnosis not present

## 2023-01-30 DIAGNOSIS — R911 Solitary pulmonary nodule: Secondary | ICD-10-CM | POA: Diagnosis not present

## 2023-01-30 DIAGNOSIS — E46 Unspecified protein-calorie malnutrition: Secondary | ICD-10-CM | POA: Diagnosis not present

## 2023-01-30 DIAGNOSIS — I517 Cardiomegaly: Secondary | ICD-10-CM | POA: Diagnosis not present

## 2023-01-30 DIAGNOSIS — M1990 Unspecified osteoarthritis, unspecified site: Secondary | ICD-10-CM | POA: Diagnosis not present

## 2023-01-30 DIAGNOSIS — M8580 Other specified disorders of bone density and structure, unspecified site: Secondary | ICD-10-CM | POA: Diagnosis not present

## 2023-01-30 DIAGNOSIS — K5900 Constipation, unspecified: Secondary | ICD-10-CM | POA: Diagnosis not present

## 2023-01-30 DIAGNOSIS — M5030 Other cervical disc degeneration, unspecified cervical region: Secondary | ICD-10-CM | POA: Diagnosis not present

## 2023-01-31 DIAGNOSIS — M5030 Other cervical disc degeneration, unspecified cervical region: Secondary | ICD-10-CM | POA: Diagnosis not present

## 2023-01-31 DIAGNOSIS — I517 Cardiomegaly: Secondary | ICD-10-CM | POA: Diagnosis not present

## 2023-01-31 DIAGNOSIS — E46 Unspecified protein-calorie malnutrition: Secondary | ICD-10-CM | POA: Diagnosis not present

## 2023-01-31 DIAGNOSIS — K5900 Constipation, unspecified: Secondary | ICD-10-CM | POA: Diagnosis not present

## 2023-01-31 DIAGNOSIS — F411 Generalized anxiety disorder: Secondary | ICD-10-CM | POA: Diagnosis not present

## 2023-01-31 DIAGNOSIS — R911 Solitary pulmonary nodule: Secondary | ICD-10-CM | POA: Diagnosis not present

## 2023-01-31 DIAGNOSIS — M8580 Other specified disorders of bone density and structure, unspecified site: Secondary | ICD-10-CM | POA: Diagnosis not present

## 2023-01-31 DIAGNOSIS — G20C Parkinsonism, unspecified: Secondary | ICD-10-CM | POA: Diagnosis not present

## 2023-01-31 DIAGNOSIS — M1990 Unspecified osteoarthritis, unspecified site: Secondary | ICD-10-CM | POA: Diagnosis not present

## 2023-02-04 DIAGNOSIS — K5900 Constipation, unspecified: Secondary | ICD-10-CM | POA: Diagnosis not present

## 2023-02-04 DIAGNOSIS — M5030 Other cervical disc degeneration, unspecified cervical region: Secondary | ICD-10-CM | POA: Diagnosis not present

## 2023-02-04 DIAGNOSIS — G20C Parkinsonism, unspecified: Secondary | ICD-10-CM | POA: Diagnosis not present

## 2023-02-04 DIAGNOSIS — M8580 Other specified disorders of bone density and structure, unspecified site: Secondary | ICD-10-CM | POA: Diagnosis not present

## 2023-02-04 DIAGNOSIS — I517 Cardiomegaly: Secondary | ICD-10-CM | POA: Diagnosis not present

## 2023-02-04 DIAGNOSIS — M1990 Unspecified osteoarthritis, unspecified site: Secondary | ICD-10-CM | POA: Diagnosis not present

## 2023-02-04 DIAGNOSIS — E46 Unspecified protein-calorie malnutrition: Secondary | ICD-10-CM | POA: Diagnosis not present

## 2023-02-04 DIAGNOSIS — F411 Generalized anxiety disorder: Secondary | ICD-10-CM | POA: Diagnosis not present

## 2023-02-04 DIAGNOSIS — R911 Solitary pulmonary nodule: Secondary | ICD-10-CM | POA: Diagnosis not present

## 2023-02-04 NOTE — Telephone Encounter (Signed)
Caller states:  - Following VO approval, an order is written up and faxed for PCP signature  - They faxed this on 4/2   Can you confirm this was received? I informed caller to re-fax in case it wasn't received.

## 2023-02-04 NOTE — Telephone Encounter (Signed)
[employer] [facility] form faxed to [phone]  Placed form to be scan

## 2023-02-05 DIAGNOSIS — E46 Unspecified protein-calorie malnutrition: Secondary | ICD-10-CM | POA: Diagnosis not present

## 2023-02-05 DIAGNOSIS — M1990 Unspecified osteoarthritis, unspecified site: Secondary | ICD-10-CM | POA: Diagnosis not present

## 2023-02-05 DIAGNOSIS — G20C Parkinsonism, unspecified: Secondary | ICD-10-CM | POA: Diagnosis not present

## 2023-02-05 DIAGNOSIS — R911 Solitary pulmonary nodule: Secondary | ICD-10-CM | POA: Diagnosis not present

## 2023-02-05 DIAGNOSIS — M8580 Other specified disorders of bone density and structure, unspecified site: Secondary | ICD-10-CM | POA: Diagnosis not present

## 2023-02-05 DIAGNOSIS — K5900 Constipation, unspecified: Secondary | ICD-10-CM | POA: Diagnosis not present

## 2023-02-05 DIAGNOSIS — F411 Generalized anxiety disorder: Secondary | ICD-10-CM | POA: Diagnosis not present

## 2023-02-05 DIAGNOSIS — M5030 Other cervical disc degeneration, unspecified cervical region: Secondary | ICD-10-CM | POA: Diagnosis not present

## 2023-02-05 DIAGNOSIS — I517 Cardiomegaly: Secondary | ICD-10-CM | POA: Diagnosis not present

## 2023-02-06 ENCOUNTER — Telehealth: Payer: Self-pay | Admitting: Family Medicine

## 2023-02-06 DIAGNOSIS — E46 Unspecified protein-calorie malnutrition: Secondary | ICD-10-CM | POA: Diagnosis not present

## 2023-02-06 DIAGNOSIS — K5900 Constipation, unspecified: Secondary | ICD-10-CM | POA: Diagnosis not present

## 2023-02-06 DIAGNOSIS — M1990 Unspecified osteoarthritis, unspecified site: Secondary | ICD-10-CM | POA: Diagnosis not present

## 2023-02-06 DIAGNOSIS — R911 Solitary pulmonary nodule: Secondary | ICD-10-CM | POA: Diagnosis not present

## 2023-02-06 DIAGNOSIS — M5030 Other cervical disc degeneration, unspecified cervical region: Secondary | ICD-10-CM | POA: Diagnosis not present

## 2023-02-06 DIAGNOSIS — I517 Cardiomegaly: Secondary | ICD-10-CM | POA: Diagnosis not present

## 2023-02-06 DIAGNOSIS — G20C Parkinsonism, unspecified: Secondary | ICD-10-CM | POA: Diagnosis not present

## 2023-02-06 DIAGNOSIS — F411 Generalized anxiety disorder: Secondary | ICD-10-CM | POA: Diagnosis not present

## 2023-02-06 DIAGNOSIS — M8580 Other specified disorders of bone density and structure, unspecified site: Secondary | ICD-10-CM | POA: Diagnosis not present

## 2023-02-06 NOTE — Telephone Encounter (Signed)
Home Health Verbal Orders  Agency:  [facility]  Caller: Lonia Mad and title  Requesting OT/ PT/ Skilled nursing/ Social Work/ Speech:    Reason for Request:  Requesting continue Speech Therapy -  1 a week for 4 weeks, voice and speech therapy for parkinson's  Frequency:    HH needs F2F w/in last 30 days    [phone]

## 2023-02-10 NOTE — Telephone Encounter (Signed)
Called [phone] VO left in voice mail

## 2023-02-10 NOTE — Telephone Encounter (Signed)
Ok to give VO? 

## 2023-02-10 NOTE — Telephone Encounter (Signed)
Ok with me. Please place any necessary orders. 

## 2023-02-11 DIAGNOSIS — M8580 Other specified disorders of bone density and structure, unspecified site: Secondary | ICD-10-CM | POA: Diagnosis not present

## 2023-02-11 DIAGNOSIS — M5030 Other cervical disc degeneration, unspecified cervical region: Secondary | ICD-10-CM | POA: Diagnosis not present

## 2023-02-11 DIAGNOSIS — F411 Generalized anxiety disorder: Secondary | ICD-10-CM | POA: Diagnosis not present

## 2023-02-11 DIAGNOSIS — E46 Unspecified protein-calorie malnutrition: Secondary | ICD-10-CM | POA: Diagnosis not present

## 2023-02-11 DIAGNOSIS — G20C Parkinsonism, unspecified: Secondary | ICD-10-CM | POA: Diagnosis not present

## 2023-02-11 DIAGNOSIS — R911 Solitary pulmonary nodule: Secondary | ICD-10-CM | POA: Diagnosis not present

## 2023-02-11 DIAGNOSIS — I517 Cardiomegaly: Secondary | ICD-10-CM | POA: Diagnosis not present

## 2023-02-11 DIAGNOSIS — M1990 Unspecified osteoarthritis, unspecified site: Secondary | ICD-10-CM | POA: Diagnosis not present

## 2023-02-11 DIAGNOSIS — K5900 Constipation, unspecified: Secondary | ICD-10-CM | POA: Diagnosis not present

## 2023-02-12 DIAGNOSIS — M8580 Other specified disorders of bone density and structure, unspecified site: Secondary | ICD-10-CM | POA: Diagnosis not present

## 2023-02-12 DIAGNOSIS — M5030 Other cervical disc degeneration, unspecified cervical region: Secondary | ICD-10-CM | POA: Diagnosis not present

## 2023-02-12 DIAGNOSIS — F411 Generalized anxiety disorder: Secondary | ICD-10-CM | POA: Diagnosis not present

## 2023-02-12 DIAGNOSIS — R911 Solitary pulmonary nodule: Secondary | ICD-10-CM | POA: Diagnosis not present

## 2023-02-12 DIAGNOSIS — K5900 Constipation, unspecified: Secondary | ICD-10-CM | POA: Diagnosis not present

## 2023-02-12 DIAGNOSIS — M1990 Unspecified osteoarthritis, unspecified site: Secondary | ICD-10-CM | POA: Diagnosis not present

## 2023-02-12 DIAGNOSIS — I517 Cardiomegaly: Secondary | ICD-10-CM | POA: Diagnosis not present

## 2023-02-12 DIAGNOSIS — G20C Parkinsonism, unspecified: Secondary | ICD-10-CM | POA: Diagnosis not present

## 2023-02-12 DIAGNOSIS — E46 Unspecified protein-calorie malnutrition: Secondary | ICD-10-CM | POA: Diagnosis not present

## 2023-02-18 DIAGNOSIS — M1990 Unspecified osteoarthritis, unspecified site: Secondary | ICD-10-CM | POA: Diagnosis not present

## 2023-02-18 DIAGNOSIS — K5900 Constipation, unspecified: Secondary | ICD-10-CM | POA: Diagnosis not present

## 2023-02-18 DIAGNOSIS — F411 Generalized anxiety disorder: Secondary | ICD-10-CM | POA: Diagnosis not present

## 2023-02-18 DIAGNOSIS — M5030 Other cervical disc degeneration, unspecified cervical region: Secondary | ICD-10-CM | POA: Diagnosis not present

## 2023-02-18 DIAGNOSIS — E46 Unspecified protein-calorie malnutrition: Secondary | ICD-10-CM | POA: Diagnosis not present

## 2023-02-18 DIAGNOSIS — R911 Solitary pulmonary nodule: Secondary | ICD-10-CM | POA: Diagnosis not present

## 2023-02-18 DIAGNOSIS — G20C Parkinsonism, unspecified: Secondary | ICD-10-CM | POA: Diagnosis not present

## 2023-02-18 DIAGNOSIS — M8580 Other specified disorders of bone density and structure, unspecified site: Secondary | ICD-10-CM | POA: Diagnosis not present

## 2023-02-18 DIAGNOSIS — I517 Cardiomegaly: Secondary | ICD-10-CM | POA: Diagnosis not present

## 2023-02-19 DIAGNOSIS — M5030 Other cervical disc degeneration, unspecified cervical region: Secondary | ICD-10-CM | POA: Diagnosis not present

## 2023-02-19 DIAGNOSIS — M1990 Unspecified osteoarthritis, unspecified site: Secondary | ICD-10-CM | POA: Diagnosis not present

## 2023-02-19 DIAGNOSIS — R269 Unspecified abnormalities of gait and mobility: Secondary | ICD-10-CM | POA: Diagnosis not present

## 2023-02-19 DIAGNOSIS — G20A1 Parkinson's disease without dyskinesia, without mention of fluctuations: Secondary | ICD-10-CM | POA: Diagnosis not present

## 2023-02-19 DIAGNOSIS — M8580 Other specified disorders of bone density and structure, unspecified site: Secondary | ICD-10-CM | POA: Diagnosis not present

## 2023-02-19 DIAGNOSIS — G20C Parkinsonism, unspecified: Secondary | ICD-10-CM | POA: Diagnosis not present

## 2023-02-19 DIAGNOSIS — Z1330 Encounter for screening examination for mental health and behavioral disorders, unspecified: Secondary | ICD-10-CM | POA: Diagnosis not present

## 2023-02-19 DIAGNOSIS — I517 Cardiomegaly: Secondary | ICD-10-CM | POA: Diagnosis not present

## 2023-02-19 DIAGNOSIS — K5900 Constipation, unspecified: Secondary | ICD-10-CM | POA: Diagnosis not present

## 2023-02-19 DIAGNOSIS — R911 Solitary pulmonary nodule: Secondary | ICD-10-CM | POA: Diagnosis not present

## 2023-02-19 DIAGNOSIS — F411 Generalized anxiety disorder: Secondary | ICD-10-CM | POA: Diagnosis not present

## 2023-02-19 DIAGNOSIS — E46 Unspecified protein-calorie malnutrition: Secondary | ICD-10-CM | POA: Diagnosis not present

## 2023-02-24 ENCOUNTER — Telehealth: Payer: Self-pay | Admitting: Pharmacist

## 2023-02-24 ENCOUNTER — Encounter: Payer: Medicare HMO | Admitting: Family Medicine

## 2023-02-24 NOTE — Progress Notes (Signed)
Care Management & Coordination Services Pharmacy Team  Reason for Encounter: Medication coordination and delivery  Contacted patient to discuss medications and coordinate delivery from [facility]. Spoke with patient on 02/24/2023  Cycle dispensing form sent to Erskine Emery for review.   Last adherence delivery date: 01/07/2023      Patient is due for next adherence delivery on: 03/06/2023  This delivery to include: Vials  90 Days  Pantoprazole 40 mg once daily Carbidopa-Levodopa 25-250 mg three times daily Melatonin 3 mg at bedtime Diazepam 5 mg 1/2-1 tablet as needed  Refills requested from providers include: Pantoprazole 40 mg once daily  Confirmed delivery date of 03/06/2023, advised patient that pharmacy will contact them the morning of delivery.   Any concerns about your medications? No  How often do you forget or accidentally miss a dose? Rarely  Do you use a pillbox? No  Is patient in packaging No  If yes  What is the date on your next pill pack?  Any concerns or issues with your packaging?   Chart review: Recent office visits:  None  Recent consult visits:  None  Hospital visits:  01/13/2023 ED visit for Diarrhea -no medication changes indicated  Medications: Outpatient Encounter Medications as of 02/24/2023  Medication Sig   Amino Acids-Protein Hydrolys (FEEDING SUPPLEMENT, PRO-STAT 64,) LIQD Take 30 mLs by mouth 3 (three) times daily with meals.   Ascorbic Acid (VITAMIN C PO) Take 1 tablet by mouth daily.   b complex vitamins capsule Take 1 capsule by mouth daily.   carbidopa-levodopa (SINEMET IR) 25-250 MG tablet Take 1 tablet by mouth 4 (four) times daily.   Cholecalciferol (VITAMIN D) 2000 UNITS CAPS Take 2,000 Units by mouth daily.   diazepam (VALIUM) 5 MG tablet Take 0.5-1 tablets (2.5-5 mg total) by mouth every 12 (twelve) hours as needed for anxiety.   feeding supplement (ENSURE ENLIVE / ENSURE PLUS) LIQD Take 237 mLs by mouth 3 (three) times  daily between meals.   glucosamine-chondroitin 500-400 MG tablet Take 1 tablet by mouth daily.   hydrocortisone cream 1 % Apply topically daily.   ibuprofen (ADVIL) 200 MG tablet Take 200 mg by mouth every 6 (six) hours as needed.   lidocaine (LIDODERM) 5 % Place 1 patch onto the skin daily. Remove & Discard patch within 12 hours or as directed by MD   magnesium 30 MG tablet Take 30 mg by mouth 2 (two) times daily.   Multiple Vitamin (MULTIVITAMIN) capsule Take 1 capsule by mouth daily.   Omega-3 Fatty Acids (OMEGA 3 PO) Take 1 tablet by mouth daily.   pantoprazole (PROTONIX) 40 MG tablet Take 1 tablet (40 mg total) by mouth daily.   tetrahydrozoline-zinc (VISINE-AC) 0.05-0.25 % ophthalmic solution Place 2 drops into both eyes 4 (four) times daily as needed (allergies).   vitamin B-12 (CYANOCOBALAMIN) 1000 MCG tablet Take 1,000 mcg by mouth daily.   [DISCONTINUED] amantadine (SYMMETREL) 100 MG capsule Take 1 capsule (100 mg total) by mouth 2 (two) times daily. (Patient not taking: Reported on 09/21/2019)   No facility-administered encounter medications on file as of 02/24/2023.   BP Readings from Last 3 Encounters:  01/13/23 136/89  12/10/22 136/82  06/28/22 117/73    Pulse Readings from Last 3 Encounters:  01/13/23 95  12/10/22 (!) 106  06/28/22 (!) 44    Lab Results  Component Value Date/Time   HGBA1C 5.4 01/19/2022 04:36 AM   HGBA1C 5.8 09/25/2021 03:20 PM   Lab Results  Component Value Date  CREATININE 0.61 01/13/2023   BUN 16 01/13/2023   GFR 87.51 09/25/2021   GFRNONAA >60 01/13/2023   GFRAA >60 01/31/2020   NA 136 01/13/2023   K 3.5 01/13/2023   CALCIUM 9.2 01/13/2023   CO2 23 01/13/2023     Future Appointments  Date Time Provider Department Center  03/06/2023  2:00 PM Ardith Dark, MD [facility]  01/01/2024  1:00 PM [facility] ANNUAL WELLNESS VISIT 1 [facility]   April D Calhoun, [facility] Clinical Pharmacist Assistant [phone]

## 2023-02-25 DIAGNOSIS — F411 Generalized anxiety disorder: Secondary | ICD-10-CM | POA: Diagnosis not present

## 2023-02-25 DIAGNOSIS — E46 Unspecified protein-calorie malnutrition: Secondary | ICD-10-CM | POA: Diagnosis not present

## 2023-02-25 DIAGNOSIS — M1990 Unspecified osteoarthritis, unspecified site: Secondary | ICD-10-CM | POA: Diagnosis not present

## 2023-02-25 DIAGNOSIS — M5030 Other cervical disc degeneration, unspecified cervical region: Secondary | ICD-10-CM | POA: Diagnosis not present

## 2023-02-25 DIAGNOSIS — R911 Solitary pulmonary nodule: Secondary | ICD-10-CM | POA: Diagnosis not present

## 2023-02-25 DIAGNOSIS — M8580 Other specified disorders of bone density and structure, unspecified site: Secondary | ICD-10-CM | POA: Diagnosis not present

## 2023-02-25 DIAGNOSIS — G20C Parkinsonism, unspecified: Secondary | ICD-10-CM | POA: Diagnosis not present

## 2023-02-25 DIAGNOSIS — K5900 Constipation, unspecified: Secondary | ICD-10-CM | POA: Diagnosis not present

## 2023-02-25 DIAGNOSIS — I517 Cardiomegaly: Secondary | ICD-10-CM | POA: Diagnosis not present

## 2023-02-26 ENCOUNTER — Telehealth: Payer: Self-pay | Admitting: *Deleted

## 2023-02-26 ENCOUNTER — Other Ambulatory Visit: Payer: Self-pay | Admitting: *Deleted

## 2023-02-26 DIAGNOSIS — I517 Cardiomegaly: Secondary | ICD-10-CM | POA: Diagnosis not present

## 2023-02-26 DIAGNOSIS — E46 Unspecified protein-calorie malnutrition: Secondary | ICD-10-CM | POA: Diagnosis not present

## 2023-02-26 DIAGNOSIS — R911 Solitary pulmonary nodule: Secondary | ICD-10-CM | POA: Diagnosis not present

## 2023-02-26 DIAGNOSIS — F411 Generalized anxiety disorder: Secondary | ICD-10-CM | POA: Diagnosis not present

## 2023-02-26 DIAGNOSIS — M1990 Unspecified osteoarthritis, unspecified site: Secondary | ICD-10-CM | POA: Diagnosis not present

## 2023-02-26 DIAGNOSIS — M5030 Other cervical disc degeneration, unspecified cervical region: Secondary | ICD-10-CM | POA: Diagnosis not present

## 2023-02-26 DIAGNOSIS — G20C Parkinsonism, unspecified: Secondary | ICD-10-CM | POA: Diagnosis not present

## 2023-02-26 DIAGNOSIS — M8580 Other specified disorders of bone density and structure, unspecified site: Secondary | ICD-10-CM | POA: Diagnosis not present

## 2023-02-26 DIAGNOSIS — K5900 Constipation, unspecified: Secondary | ICD-10-CM | POA: Diagnosis not present

## 2023-02-26 MED ORDER — PANTOPRAZOLE SODIUM 40 MG PO TBEC: 40.0000 mg | DELAYED_RELEASE_TABLET | Freq: Every day | ORAL | 1 refills | Status: DC

## 2023-02-26 NOTE — Telephone Encounter (Signed)
[facility] form was fax #[phone] on 02/25/2023 Form was placed to be scan in pt chart

## 2023-02-28 DIAGNOSIS — E46 Unspecified protein-calorie malnutrition: Secondary | ICD-10-CM | POA: Diagnosis not present

## 2023-02-28 DIAGNOSIS — M1990 Unspecified osteoarthritis, unspecified site: Secondary | ICD-10-CM | POA: Diagnosis not present

## 2023-02-28 DIAGNOSIS — M8580 Other specified disorders of bone density and structure, unspecified site: Secondary | ICD-10-CM | POA: Diagnosis not present

## 2023-02-28 DIAGNOSIS — I517 Cardiomegaly: Secondary | ICD-10-CM | POA: Diagnosis not present

## 2023-02-28 DIAGNOSIS — G20C Parkinsonism, unspecified: Secondary | ICD-10-CM | POA: Diagnosis not present

## 2023-02-28 DIAGNOSIS — F411 Generalized anxiety disorder: Secondary | ICD-10-CM | POA: Diagnosis not present

## 2023-02-28 DIAGNOSIS — K5900 Constipation, unspecified: Secondary | ICD-10-CM | POA: Diagnosis not present

## 2023-02-28 DIAGNOSIS — R911 Solitary pulmonary nodule: Secondary | ICD-10-CM | POA: Diagnosis not present

## 2023-02-28 DIAGNOSIS — M5030 Other cervical disc degeneration, unspecified cervical region: Secondary | ICD-10-CM | POA: Diagnosis not present

## 2023-03-03 ENCOUNTER — Encounter: Payer: Medicare HMO | Admitting: Family Medicine

## 2023-03-04 DIAGNOSIS — E46 Unspecified protein-calorie malnutrition: Secondary | ICD-10-CM | POA: Diagnosis not present

## 2023-03-04 DIAGNOSIS — M5030 Other cervical disc degeneration, unspecified cervical region: Secondary | ICD-10-CM | POA: Diagnosis not present

## 2023-03-04 DIAGNOSIS — F411 Generalized anxiety disorder: Secondary | ICD-10-CM | POA: Diagnosis not present

## 2023-03-04 DIAGNOSIS — I517 Cardiomegaly: Secondary | ICD-10-CM | POA: Diagnosis not present

## 2023-03-04 DIAGNOSIS — M8580 Other specified disorders of bone density and structure, unspecified site: Secondary | ICD-10-CM | POA: Diagnosis not present

## 2023-03-04 DIAGNOSIS — R911 Solitary pulmonary nodule: Secondary | ICD-10-CM | POA: Diagnosis not present

## 2023-03-04 DIAGNOSIS — G20C Parkinsonism, unspecified: Secondary | ICD-10-CM | POA: Diagnosis not present

## 2023-03-04 DIAGNOSIS — M1990 Unspecified osteoarthritis, unspecified site: Secondary | ICD-10-CM | POA: Diagnosis not present

## 2023-03-04 DIAGNOSIS — K5900 Constipation, unspecified: Secondary | ICD-10-CM | POA: Diagnosis not present

## 2023-03-05 DIAGNOSIS — M8580 Other specified disorders of bone density and structure, unspecified site: Secondary | ICD-10-CM | POA: Diagnosis not present

## 2023-03-05 DIAGNOSIS — M5030 Other cervical disc degeneration, unspecified cervical region: Secondary | ICD-10-CM | POA: Diagnosis not present

## 2023-03-05 DIAGNOSIS — F411 Generalized anxiety disorder: Secondary | ICD-10-CM | POA: Diagnosis not present

## 2023-03-05 DIAGNOSIS — G20C Parkinsonism, unspecified: Secondary | ICD-10-CM | POA: Diagnosis not present

## 2023-03-05 DIAGNOSIS — M1990 Unspecified osteoarthritis, unspecified site: Secondary | ICD-10-CM | POA: Diagnosis not present

## 2023-03-05 DIAGNOSIS — K5900 Constipation, unspecified: Secondary | ICD-10-CM | POA: Diagnosis not present

## 2023-03-05 DIAGNOSIS — E46 Unspecified protein-calorie malnutrition: Secondary | ICD-10-CM | POA: Diagnosis not present

## 2023-03-05 DIAGNOSIS — I517 Cardiomegaly: Secondary | ICD-10-CM | POA: Diagnosis not present

## 2023-03-05 DIAGNOSIS — R911 Solitary pulmonary nodule: Secondary | ICD-10-CM | POA: Diagnosis not present

## 2023-03-06 ENCOUNTER — Encounter: Payer: Medicare HMO | Admitting: Family Medicine

## 2023-03-07 ENCOUNTER — Telehealth: Payer: Self-pay | Admitting: Family Medicine

## 2023-03-07 NOTE — Telephone Encounter (Signed)
[facility] faxed document Home Health Certificate (Order ID [id number] ), to be filled out by provider. requested to send it via Fax [phone]. Document is located in providers tray at front office.Please advise

## 2023-03-08 ENCOUNTER — Other Ambulatory Visit: Payer: Self-pay | Admitting: Family Medicine

## 2023-03-10 NOTE — Telephone Encounter (Signed)
Form placed in PCP office to be reviewed  

## 2023-03-11 NOTE — Telephone Encounter (Signed)
[facility] form order [phone] and order # [id number] faxed to [phone] Forms placed to be scan in patient chart

## 2023-03-17 ENCOUNTER — Telehealth: Payer: Self-pay | Admitting: Pharmacist

## 2023-03-17 NOTE — Progress Notes (Signed)
Care Management & Coordination Services Pharmacy Team  Reason for Encounter: Appointment Reminder  Contacted patient to confirm telephone appointment with Erskine Emery, PharmD on 03/18/2023 at 4 pm. Spoke with patient on 03/17/2023    Star Rating Drugs:  None   Care Gaps: Annual wellness visit in last year? Yes  April D Calhoun, [facility] Clinical Pharmacist Assistant [phone]

## 2023-03-18 ENCOUNTER — Ambulatory Visit: Payer: Medicare HMO | Admitting: Pharmacist

## 2023-03-18 NOTE — Progress Notes (Signed)
Care Management & Coordination Services Pharmacy Note  03/19/2023 Name:  [name] [name] MRN:  [id number] DOB:  27-Apr-1951  Summary: PharmD FU visit.  Patient feeling lethargic due to dosing of Sinemet.  For now, patient has plans to go see a specialist.  She is looking for alternative medications to treat.  Will continue to monitor/  Recommendations/Changes made from today's visit: No changes, continue to see specialist.  Follow up plan: FU 6 months   Subjective: [name] [name] is an 72 y.o. year old female who is a primary patient of [name] [name], [name] Degree, MD.  The care coordination team was consulted for assistance with disease management and care coordination needs.    Engaged with patient by telephone for follow up visit.  Recent office visits:  None   Recent consult visits:  None   Hospital visits:  01/13/2023 ED visit for Diarrhea -no medication changes indicated   Objective:  Lab Results  Component Value Date   CREATININE 0.61 01/13/2023   BUN 16 01/13/2023   GFR 87.51 09/25/2021   GFRNONAA >60 01/13/2023   GFRAA >60 01/31/2020   NA 136 01/13/2023   K 3.5 01/13/2023   CALCIUM 9.2 01/13/2023   CO2 23 01/13/2023   GLUCOSE 82 01/13/2023    Lab Results  Component Value Date/Time   HGBA1C 5.4 01/19/2022 04:36 AM   HGBA1C 5.8 09/25/2021 03:20 PM   GFR 87.51 09/25/2021 03:20 PM   GFR 103.67 08/05/2019 03:12 PM    Last diabetic Eye exam: No results found for: "HMDIABEYEEXA"  Last diabetic Foot exam: No results found for: "HMDIABFOOTEX"   Lab Results  Component Value Date   CHOL 161 09/25/2021   HDL 70.30 09/25/2021   LDLCALC 80 09/25/2021   TRIG 58 02/04/2022   CHOLHDL 2 09/25/2021       Latest Ref Rng & Units 01/13/2023    5:42 PM 04/17/2022    5:13 PM 02/07/2022    3:54 AM  Hepatic Function  Total Protein 6.5 - 8.1 g/dL 6.8  7.9  5.8   Albumin 3.5 - 5.0 g/dL 4.0  3.9  1.8   AST 15 - 41 U/L 14  15  31    ALT 0 - 44 U/L 13  5  22    Alk Phosphatase 38  - 126 U/L 46  69  56   Total Bilirubin 0.3 - 1.2 mg/dL 0.7  0.5  0.3     Lab Results  Component Value Date/Time   TSH 0.90 09/25/2021 03:20 PM   TSH 0.51 08/05/2019 03:12 PM       Latest Ref Rng & Units 01/13/2023    5:42 PM 04/17/2022    5:13 PM 02/09/2022    3:06 AM  CBC  WBC 4.0 - 10.5 K/uL 6.7  5.7  8.8   Hemoglobin 12.0 - 15.0 g/dL 40.9  81.1  9.0   Hematocrit 36.0 - 46.0 % 38.6  43.1  27.9   Platelets 150 - 400 K/uL 138  171  463     Lab Results  Component Value Date/Time   VD25OH 37.66 08/05/2019 03:12 PM   VITAMINB12 >1550 (H) 09/25/2021 03:20 PM   VITAMINB12 1,342 (H) 08/05/2019 03:12 PM    Clinical ASCVD: No  The 10-year ASCVD risk score (Arnett DK, et al., 2019) is: 14.1%   Values used to calculate the score:     Age: 32 years     Sex: Female     Is Non-Hispanic African American: Yes  Diabetic: No     Tobacco smoker: No     Systolic Blood Pressure: 136 mmHg     Is BP treated: No     HDL Cholesterol: 70.3 mg/dL     Total Cholesterol: 161 mg/dL        1/61/0960   45:40 AM 12/10/2022    1:35 PM 05/21/2022    2:27 PM  Depression screen PHQ 2/9  Decreased Interest 0 0 0  Down, Depressed, Hopeless 0 0 0  PHQ - 2 Score 0 0 0     Social History   Tobacco Use  Smoking Status Never  Smokeless Tobacco Never   BP Readings from Last 3 Encounters:  01/13/23 136/89  12/10/22 136/82  06/28/22 117/73   Pulse Readings from Last 3 Encounters:  01/13/23 95  12/10/22 (!) 106  06/28/22 (!) 44   Wt Readings from Last 3 Encounters:  01/13/23 115 lb (52.2 kg)  12/10/22 99 lb (44.9 kg)  06/28/22 103 lb 6.4 oz (46.9 kg)   BMI Readings from Last 3 Encounters:  01/13/23 18.01 kg/m  12/10/22 15.51 kg/m  06/28/22 16.19 kg/m    Allergies  Allergen Reactions   Sulfa Antibiotics Other (See Comments)    Childhood allergy        SDOH:  (Social Determinants of Health) assessments and interventions performed: No Financial Resource Strain: Low Risk   (12/17/2022)   Overall Financial Resource Strain (CARDIA)    Difficulty of Paying Living Expenses: Not hard at all   Food Insecurity: No Food Insecurity (12/17/2022)   Hunger Vital Sign    Worried About Running Out of Food in the Last Year: Never true    Ran Out of Food in the Last Year: Never true    SDOH Interventions    Flowsheet Row Clinical Support from 12/17/2022 in [facility] from 02/14/2022 in [facility]  SDOH Interventions    Food Insecurity Interventions Intervention Not Indicated --  Housing Interventions Intervention Not Indicated --  [referral to care guide,  anticipated need with health decline]  Transportation Interventions Intervention Not Indicated Other (Comment)  Utilities Interventions Intervention Not Indicated --  Financial Strain Interventions Intervention Not Indicated --  Physical Activity Interventions Intervention Not Indicated --  Stress Interventions Intervention Not Indicated --  Social Connections Interventions Intervention Not Indicated --       Medication Assistance: None required.  Patient affirms current coverage meets needs.  Medication Access: Name and location of current pharmacy:  [facility] - [place], [place] - [address] [place] [address] Phone: [phone] Fax: [phone]  [id number] [place], [place] - [address] AT [facility] [address] [place] [address] Phone: [phone] Fax: [phone]  Within the past 30 days, how often has patient missed a dose of medication? 0 Is a pillbox or other method used to improve adherence? Yes  Factors that may affect medication adherence? no barriers identified Are meds synced by current pharmacy? Yes  Are meds delivered by current pharmacy? Yes  Does patient experience delays in picking up  medications due to transportation concerns? No   Compliance/Adherence/Medication fill history: Star Rating Drugs:  None     Care Gaps: Annual wellness visit in last year? Yes   Assessment/Plan    Depression/Anxiety (Goal: Minimize symptoms) 09/17/22 -Controlled -Current treatment: Diazepam 5mg  1/2 to 1 tab q 12h Appropriate,  Effective, Safe, Accessible -Medications previously tried/failed: citalopram -PHQ9:  Flowsheet Row Office Visit from 09/17/2018 in [facility]  PHQ-9 Total Score 5     Reports Diazepam has helped since she started taking regularly.  It calms her down to where she is able to rest if she needs to. Unclear if it is contributing to fatigue and drowsiness reported during the day   Update 03/05/22 No longer taking Lexampro, Valium, or Seroquel. Doing well on as needed Atarax at the moment. She has plans to continue this post release home. Will continue to follow and assess for further needs. No changes at this time.   -GAD7:  GAD 7 : Generalized Anxiety Score 09/17/2018 05/07/2018  Nervous, Anxious, on Edge 1 3  Control/stop worrying 0 1  Worry too much - different things 0 0  Trouble relaxing 1 3  Restless 0 1  Easily annoyed or irritable 0 0  Afraid - awful might happen 0 0  Total GAD 7 Score 2 8  Anxiety Difficulty Somewhat difficult Very difficult  -Educated on Benefits of medication for symptom control -Patient taking medication at night and having a difficult time sleeping longer than 4-5 hours. -Recommended to continue current medication Trial taking medication in the morning to see if this helps with sleep concern.  Parkinson's Disease (Goal: Control symptoms) 03/19/23 -Controlled -Current treatment  Carbidopa-levodopa 25-250 QID Appropriate, Query Effective -Medications previously tried: Sinemet IT -Still feeling tired and lethargic.  Taking IR has helped, but sometimes feels very sick and lethargic.  It helps  her movement, however, she does not like the way she feels when taking this medication daily.  She has tried to alter the number of times per day she takes with no positive results.  She is scheduled to go see a specialist to discuss this.  Will defer any changes to specialist. Continue to monitor symptoms, CMA to check in regularly every 30 days.  Update 03/05/22 Stable on current dose of Carb/levo. Daughter reports she is doing better, plans to come home next weekend. Currently getting all meds from SNF. Will set up to have meds delivered when she returns home. No changes will FU at discharge to ensure correct dose of all meds are delivered.  Constipation (Goal: Regular bowel movements) -Controlled, not assessed -Current treatment  Colace 100mg  prn -Medications previously tried: Amitiza (too strong) -Bowels are normal at this time  -Recommended to continue current medication  Allergies (Goal: Minimize symptoms) -Controlled -Current treatment  Fluticasone prn -Medications previously tried: none noted -Does not have to use very often, allergy symptoms have been controlled lately  -Recommended to continue current medication  Osteoarthritis (Goal: Control symptoms) -Controlled -Current treatment  None -Medications previously tried: none noted -Pain level remains the same - no changes at this time  -Recommended to continue current medication            Willa Frater, PharmD Clinical Pharmacist  [facility] [phone]

## 2023-03-21 ENCOUNTER — Ambulatory Visit (INDEPENDENT_AMBULATORY_CARE_PROVIDER_SITE_OTHER): Payer: Medicare HMO | Admitting: Family Medicine

## 2023-03-21 VITALS — BP 120/82 | HR 99 | Temp 97.3°F | Ht 67.0 in | Wt 100.4 lb

## 2023-03-21 DIAGNOSIS — R143 Flatulence: Secondary | ICD-10-CM | POA: Insufficient documentation

## 2023-03-21 DIAGNOSIS — M159 Polyosteoarthritis, unspecified: Secondary | ICD-10-CM

## 2023-03-21 DIAGNOSIS — D649 Anemia, unspecified: Secondary | ICD-10-CM

## 2023-03-21 DIAGNOSIS — G20A1 Parkinson's disease without dyskinesia, without mention of fluctuations: Secondary | ICD-10-CM | POA: Diagnosis not present

## 2023-03-21 DIAGNOSIS — R5381 Other malaise: Secondary | ICD-10-CM

## 2023-03-21 DIAGNOSIS — Z131 Encounter for screening for diabetes mellitus: Secondary | ICD-10-CM | POA: Diagnosis not present

## 2023-03-21 DIAGNOSIS — E43 Unspecified severe protein-calorie malnutrition: Secondary | ICD-10-CM | POA: Diagnosis not present

## 2023-03-21 DIAGNOSIS — F419 Anxiety disorder, unspecified: Secondary | ICD-10-CM | POA: Diagnosis not present

## 2023-03-21 DIAGNOSIS — E538 Deficiency of other specified B group vitamins: Secondary | ICD-10-CM | POA: Diagnosis not present

## 2023-03-21 DIAGNOSIS — Z0001 Encounter for general adult medical examination with abnormal findings: Secondary | ICD-10-CM

## 2023-03-21 DIAGNOSIS — Z13220 Encounter for screening for lipoid disorders: Secondary | ICD-10-CM | POA: Diagnosis not present

## 2023-03-21 LAB — LIPID PANEL
Cholesterol: 167 mg/dL (ref 0–200)
HDL: 87.2 mg/dL (ref >39.00)
LDL Cholesterol: 69 mg/dL (ref 0–99)
NonHDL: 79.36
Total CHOL/HDL Ratio: 2
Triglycerides: 51 mg/dL (ref 0.0–149.0)
VLDL: 10.2 mg/dL (ref 0.0–40.0)

## 2023-03-21 LAB — CBC
HCT: 41.1 % (ref 36.0–46.0)
Hemoglobin: 13.6 g/dL (ref 12.0–15.0)
MCHC: 33 g/dL (ref 30.0–36.0)
MCV: 90.8 fl (ref 78.0–100.0)
Platelets: 175 10*3/uL (ref 150.0–400.0)
RBC: 4.53 Mil/uL (ref 3.87–5.11)
RDW: 13.4 % (ref 11.5–15.5)
WBC: 10.7 10*3/uL — ABNORMAL HIGH (ref 4.0–10.5)

## 2023-03-21 LAB — COMPREHENSIVE METABOLIC PANEL
ALT: 13 U/L (ref 0–35)
AST: 16 U/L (ref 0–37)
Albumin: 4.3 g/dL (ref 3.5–5.2)
Alkaline Phosphatase: 63 U/L (ref 39–117)
BUN: 14 mg/dL (ref 6–23)
CO2: 27 mEq/L (ref 19–32)
Calcium: 10 mg/dL (ref 8.4–10.5)
Chloride: 103 mEq/L (ref 96–112)
Creatinine, Ser: 0.66 mg/dL (ref 0.40–1.20)
GFR: 87.23 mL/min (ref >60.00)
Glucose, Bld: 80 mg/dL (ref 70–99)
Potassium: 4.2 mEq/L (ref 3.5–5.1)
Sodium: 140 mEq/L (ref 135–145)
Total Bilirubin: 0.4 mg/dL (ref 0.2–1.2)
Total Protein: 7.5 g/dL (ref 6.0–8.3)

## 2023-03-21 LAB — VITAMIN B12: Vitamin B-12: 1119 pg/mL — ABNORMAL HIGH (ref 211–911)

## 2023-03-21 LAB — HEMOGLOBIN A1C: Hgb A1c MFr Bld: 5.4 % (ref 4.6–6.5)

## 2023-03-21 LAB — TSH: TSH: 0.98 u[IU]/mL (ref 0.35–5.50)

## 2023-03-21 MED ORDER — SIMETHICONE 125 MG PO TABS: 250.0000 mg | ORAL_TABLET | Freq: Two times a day (BID) | ORAL | 11 refills | Status: AC

## 2023-03-21 MED ORDER — DIAZEPAM 5 MG PO TABS: 5.0000 mg | ORAL_TABLET | Freq: Two times a day (BID) | ORAL | 5 refills | Status: DC | PRN

## 2023-03-21 NOTE — Assessment & Plan Note (Signed)
On Sinemet.  Following with neurology though she is interested in seeing a Parkinson's disease specialist.  Will place referral today.  May need to go to tertiary care center if we cannot get her seen here in [place].

## 2023-03-21 NOTE — Assessment & Plan Note (Signed)
No red flags.  Reassuring exam today.  Will check labs.  Will increase her simethicone to 250 mg twice daily.  May consider addition of antispasmodic such as Bentyl or hyoscyamine if not improving.

## 2023-03-21 NOTE — Assessment & Plan Note (Signed)
Overall symptoms are stable.  She takes Valium 2.5 to 5 mg twice daily as needed.

## 2023-03-21 NOTE — Assessment & Plan Note (Signed)
Check B12 

## 2023-03-21 NOTE — Progress Notes (Signed)
Chief Complaint:  [name] [name] is a 72 y.o. female who presents today for her annual comprehensive physical exam.    Assessment/Plan:  Chronic Problems Addressed Today: Parkinson disease (HCC) On Sinemet.  Following with neurology though she is interested in seeing a Parkinson's disease specialist.  Will place referral today.  May need to go to tertiary care center if we cannot get her seen here in [place].  Debility Overall she is doing about the same as last time we saw her however has had a general decline over the last several months.  She does have underlying deconditioning as well which is contributing.  She has protein calorie malnutrition which is contributing as well.  We did refer her to home health a few months ago however this ended up getting canceled due to her having to postpone due to being sick.  Will send the pills later today stating that she would benefit from continued physical therapy.  B12 deficiency Check B12.   Anxiety Overall symptoms are stable.  She takes Valium 2.5 to 5 mg twice daily as needed.  Flatulence No red flags.  Reassuring exam today.  Will check labs.  Will increase her simethicone to 250 mg twice daily.  May consider addition of antispasmodic such as Bentyl or hyoscyamine if not improving.    Preventative Healthcare: Check labs. No longer needs cancer screening due to age.   Patient Counseling(The following topics were reviewed and/or handout was given):  -Nutrition: Stressed importance of moderation in sodium/caffeine intake, saturated fat and cholesterol, caloric balance, sufficient intake of fresh fruits, vegetables, and fiber.  -Stressed the importance of regular exercise.   -Substance Abuse: Discussed cessation/primary prevention of tobacco, alcohol, or other drug use; driving or other dangerous activities under the influence; availability of treatment for abuse.   -Injury prevention: Discussed safety belts, safety helmets, smoke  detector, smoking near bedding or upholstery.   -Sexuality: Discussed sexually transmitted diseases, partner selection, use of condoms, avoidance of unintended pregnancy and contraceptive alternatives.   -Dental health: Discussed importance of regular tooth brushing, flossing, and dental visits.  -Health maintenance and immunizations reviewed. Please refer to Health maintenance section.  Return to care in 1 year for next preventative visit.     Subjective:  HPI:  She has no acute complaints today. See Ap for status of chronic conditions.   She is here today with her daughter.  Overall seems to be doing well.  She is currently following with neurology for Parkinson's however she is interested in seeing a Parkinson specialist.  She did discuss this with her neurologist who told her that she would have to be referred over to [facility] or [place].  She would like to stay in [place] if possible.  We saw her several months ago.  At our last visit we did refer her for home health physical therapy.  This was initially set up however she had to postpone due to being ill.  Apparently home health was then canceled and they are working on getting his ability to have it reinstated.  She is also been having some more gas pain.  She takes Gas-X once per day but does not think that this is adequately controlling her symptoms.     03/21/2023    1:47 PM  Depression screen PHQ 2/9  Decreased Interest 0  Down, Depressed, Hopeless 0  PHQ - 2 Score 0    Health Maintenance Due  Topic Date Due   DTaP/Tdap/Td (1 - Tdap) Never  done   Zoster Vaccines- Shingrix (1 of 2) Never done     ROS: Per HPI, otherwise a complete review of systems was negative.   PMH:  The following were reviewed and entered/updated in epic: Past Medical History:  Diagnosis Date   Fibroid    GERD (gastroesophageal reflux disease)    Muscle weakness of lower extremity 01/17/2015   Osteopenia    Parkinson disease 01/17/2015    Patient Active Problem List   Diagnosis Date Noted   Flatulence 03/21/2023   B12 deficiency 06/28/2022   Enterocutaneous fistula 02/05/2022   Pressure injury of coccygeal region, stage 1 01/23/2022   Goals of care, counseling/discussion    Protein-calorie malnutrition, severe 01/20/2022   DDD (degenerative disc disease), cervical 06/18/2019   Constipation 03/02/2019   Seasonal allergies 03/02/2019   Anxiety 05/07/2018   Fatigue 04/20/2018   Osteoarthritis 04/20/2018   Palpitations 04/20/2018   Cardiomegaly 04/20/2018   Lesion of liver 04/20/2018   Lung nodule 04/20/2018   Debility 01/17/2015   Parkinson disease (HCC) 01/17/2015   Fibroid uterus 11/13/2012   Benign colonic polyp 10/02/2012   Past Surgical History:  Procedure Laterality Date   FINGER SURGERY     INGUINAL HERNIA REPAIR Left 01/17/2022   Procedure: Left femoral hernia repair with mesh and small bowel resection and anastomosis;  Surgeon: Kinsinger, De Blanch, MD;  Location: [facility] OR;  Service: General;  Laterality: Left;   MOUTH SURGERY     TOE SURGERY     TONSILLECTOMY     TUBAL LIGATION     x2    Family History  Problem Relation Age of Onset   Rheum arthritis Mother    Breast cancer Mother        40's   Cancer Father        throat   Breast cancer Sister        38's   Hypertension Brother    Hyperlipidemia Brother    Heart attack Son        Deceased, 63   Healthy Daughter    Parkinsonism Neg Hx     Medications- reviewed and updated Current Outpatient Medications  Medication Sig Dispense Refill   Amino Acids-Protein Hydrolys (FEEDING SUPPLEMENT, PRO-STAT 64,) LIQD Take 30 mLs by mouth 3 (three) times daily with meals. 900 mL 5   Ascorbic Acid (VITAMIN C PO) Take 1 tablet by mouth daily.     b complex vitamins capsule Take 1 capsule by mouth daily.     Cholecalciferol (VITAMIN D) 2000 UNITS CAPS Take 2,000 Units by mouth daily.     feeding supplement (ENSURE ENLIVE / ENSURE PLUS) LIQD Take 237  mLs by mouth 3 (three) times daily between meals. 237 mL 12   glucosamine-chondroitin 500-400 MG tablet Take 1 tablet by mouth daily.     hydrocortisone cream 1 % Apply topically daily. 30 g 0   ibuprofen (ADVIL) 200 MG tablet Take 200 mg by mouth every 6 (six) hours as needed.     lidocaine (LIDODERM) 5 % Place 1 patch onto the skin daily. Remove & Discard patch within 12 hours or as directed by MD 30 patch 0   magnesium 30 MG tablet Take 30 mg by mouth 2 (two) times daily.     Multiple Vitamin (MULTIVITAMIN) capsule Take 1 capsule by mouth daily.     Omega-3 Fatty Acids (OMEGA 3 PO) Take 1 tablet by mouth daily.     pantoprazole (PROTONIX) 40 MG tablet Take 1 tablet (40  mg total) by mouth daily. 90 tablet 1   Simethicone 125 MG TABS Take 2 tablets (250 mg total) by mouth in the morning and at bedtime. 120 tablet 11   tetrahydrozoline-zinc (VISINE-AC) 0.05-0.25 % ophthalmic solution Place 2 drops into both eyes 4 (four) times daily as needed (allergies).     vitamin B-12 (CYANOCOBALAMIN) 1000 MCG tablet Take 1,000 mcg by mouth daily.     carbidopa-levodopa (SINEMET IR) 25-250 MG tablet Take 1 tablet by mouth 4 (four) times daily. 120 tablet 0   diazepam (VALIUM) 5 MG tablet Take 1 tablet (5 mg total) by mouth every 12 (twelve) hours as needed for anxiety. 30 tablet 5   No current facility-administered medications for this visit.    Allergies-reviewed and updated Allergies  Allergen Reactions   Sulfa Antibiotics Other (See Comments)    Childhood allergy     Social History   Socioeconomic History   Marital status: Divorced    Spouse name: Not on file   Number of children: 4   Years of education: B.A.   Highest education level: Not on file  Occupational History   Occupation: SECRETARY  Tobacco Use   Smoking status: Never   Smokeless tobacco: Never  Vaping Use   Vaping Use: Never used  Substance and Sexual Activity   Alcohol use: No    Alcohol/week: 0.0 standard drinks of  alcohol   Drug use: No   Sexual activity: Never  Other Topics Concern   Not on file  Social History Narrative   Patient is right handed.   Patient drinks 2-3 cups of caffeine per day.   Lives alone in a 2 story home.  Has 4 living children.  1 passed away at age 84.     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   [employer] level of education: college   Social Determinants of Corporate investment banker Strain: Low Risk  (12/17/2022)   Overall Financial Resource Strain (CARDIA)    Difficulty of Paying Living Expenses: Not hard at all  Food Insecurity: No Food Insecurity (12/17/2022)   Hunger Vital Sign    Worried About Running Out of Food in the Last Year: Never true    Ran Out of Food in the Last Year: Never true  Transportation Needs: No Transportation Needs (12/17/2022)   PRAPARE - Administrator, Civil Service (Medical): No    Lack of Transportation (Non-Medical): No  Physical Activity: Insufficiently Active (12/17/2022)   Exercise Vital Sign    Days of Exercise per Week: 5 days    Minutes of Exercise per Session: 20 min  Stress: No Stress Concern Present (12/17/2022)   [employer] of Occupational Health - Occupational Stress Questionnaire    Feeling of Stress : Only a little  Social Connections: Moderately Isolated (12/17/2022)   Social Connection and Isolation Panel [NHANES]    Frequency of Communication with Friends and Family: More than three times a week    Frequency of Social Gatherings with Friends and Family: Twice a week    Attends Religious Services: 1 to 4 times per year    Active Member of [employer] or Organizations: No    Attends Banker Meetings: Never    Marital Status: Divorced        Objective:  Physical Exam: BP 120/82   Pulse 99   Temp (!) 97.3 F (36.3 C) (Temporal)   Ht 5\' 7"  (1.702 m)   Wt 100 lb  6.4 oz (45.5 kg)   SpO2 100%   BMI 15.72 kg/m   Body mass index is 15.72 kg/m. Wt Readings from  Last 3 Encounters:  03/21/23 100 lb 6.4 oz (45.5 kg)  01/13/23 115 lb (52.2 kg)  12/10/22 99 lb (44.9 kg)   Gen: NAD, resting comfortably HEENT: TMs normal bilaterally. OP clear. No thyromegaly noted.  CV: RRR with no murmurs appreciated Pulm: NWOB, CTAB with no crackles, wheezes, or rhonchi GI: Normal bowel sounds present. Soft, Nontender, Nondistended. MSK: no edema, cyanosis, or clubbing noted Skin: warm, dry Neuro: CN2-12 grossly intact. Strength 5/5 in upper and lower extremities. Reflexes symmetric and intact bilaterally.  Psych: Normal affect and thought content     Cordale Manera M. [name], MD 03/21/2023 2:44 PM

## 2023-03-21 NOTE — Assessment & Plan Note (Signed)
Overall she is doing about the same as last time we saw her however has had a general decline over the last several months.  She does have underlying deconditioning as well which is contributing.  She has protein calorie malnutrition which is contributing as well.  We did refer her to home health a few months ago however this ended up getting canceled due to her having to postpone due to being sick.  Will send the pills later today stating that she would benefit from continued physical therapy.

## 2023-03-21 NOTE — Patient Instructions (Signed)
It was very nice to see you today!  We will check blood work today.   I will refer you to a movement disorder specialist.   Return in about 1 year (around 03/20/2024) for Annual Physical.   Take care, Dr [name]  PLEASE NOTE:  If you had any lab tests, please let us know if you have not heard back within a few days. You may see your results on mychart before we have a chance to review them but we will give you a call once they are reviewed by [place].   If we ordered any referrals today, please let us know if you have not heard from their office within the next week.   If you had any urgent prescriptions sent in today, please check with the pharmacy within an hour of our visit to make sure the prescription was transmitted appropriately.   Please try these tips to maintain a healthy lifestyle:  Eat at least 3 REAL meals and 1-2 snacks per day.  Aim for no more than 5 hours between eating.  If you eat breakfast, please do so within one hour of getting up.   Each meal should contain half fruits/vegetables, one quarter protein, and one quarter carbs (no bigger than a computer mouse)  Cut down on sweet beverages. This includes juice, soda, and sweet tea.   Drink at least 1 glass of water with each meal and aim for at least 8 glasses per day  Exercise at least 150 minutes every week.     Preventive Care 22 Years and Older, Female Preventive care refers to lifestyle choices and visits with your health care provider that can promote health and wellness. Preventive care visits are also called wellness exams. What can I expect for my preventive care visit? Counseling Your health care provider may ask you questions about your: Medical history, including: Past medical problems. Family medical history. Pregnancy and menstrual history. History of falls. Current health, including: Memory and ability to understand (cognition). Emotional well-being. Home life and relationship well-being. Sexual  activity and sexual health. Lifestyle, including: Alcohol, nicotine or tobacco, and drug use. Access to firearms. Diet, exercise, and sleep habits. Work and work Astronomer. Sunscreen use. Safety issues such as seatbelt and bike helmet use. Physical exam Your health care provider will check your: Height and weight. These may be used to calculate your BMI (body mass index). BMI is a measurement that tells if you are at a healthy weight. Waist circumference. This measures the distance around your waistline. This measurement also tells if you are at a healthy weight and may help predict your risk of certain diseases, such as type 2 diabetes and high blood pressure. Heart rate and blood pressure. Body temperature. Skin for abnormal spots. What immunizations do I need?  Vaccines are usually given at various ages, according to a schedule. Your health care provider will recommend vaccines for you based on your age, medical history, and lifestyle or other factors, such as travel or where you work. What tests do I need? Screening Your health care provider may recommend screening tests for certain conditions. This may include: Lipid and cholesterol levels. Hepatitis C test. Hepatitis B test. HIV (human immunodeficiency virus) test. STI (sexually transmitted infection) testing, if you are at risk. Lung cancer screening. Colorectal cancer screening. Diabetes screening. This is done by checking your blood sugar (glucose) after you have not eaten for a while (fasting). Mammogram. Talk with your health care provider about how often you should  have regular mammograms. BRCA-related cancer screening. This may be done if you have a family history of breast, ovarian, tubal, or peritoneal cancers. Bone density scan. This is done to screen for osteoporosis. Talk with your health care provider about your test results, treatment options, and if necessary, the need for more tests. Follow these instructions  at home: Eating and drinking  Eat a diet that includes fresh fruits and vegetables, whole grains, lean protein, and low-fat dairy products. Limit your intake of foods with high amounts of sugar, saturated fats, and salt. Take vitamin and mineral supplements as recommended by your health care provider. Do not drink alcohol if your health care provider tells you not to drink. If you drink alcohol: Limit how much you have to 0-1 drink a day. Know how much alcohol is in your drink. In the [place] one drink equals one 12 oz bottle of beer (355 mL), one 5 oz glass of wine (148 mL), or one 1 oz glass of hard liquor (44 mL). Lifestyle Brush your teeth every morning and night with fluoride toothpaste. Floss one time each day. Exercise for at least 30 minutes 5 or more days each week. Do not use any products that contain nicotine or tobacco. These products include cigarettes, chewing tobacco, and vaping devices, such as e-cigarettes. If you need help quitting, ask your health care provider. Do not use drugs. If you are sexually active, practice safe sex. Use a condom or other form of protection in order to prevent STIs. Take aspirin only as told by your health care provider. Make sure that you understand how much to take and what form to take. Work with your health care provider to find out whether it is safe and beneficial for you to take aspirin daily. Ask your health care provider if you need to take a cholesterol-lowering medicine (statin). Find healthy ways to manage stress, such as: Meditation, yoga, or listening to music. Journaling. Talking to a trusted person. Spending time with friends and family. Minimize exposure to UV radiation to reduce your risk of skin cancer. Safety Always wear your seat belt while driving or riding in a vehicle. Do not drive: If you have been drinking alcohol. Do not ride with someone who has been drinking. When you are tired or distracted. While texting. If you  have been using any mind-altering substances or drugs. Wear a helmet and other protective equipment during sports activities. If you have firearms in your house, make sure you follow all gun safety procedures. What's next? Visit your health care provider once a year for an annual wellness visit. Ask your health care provider how often you should have your eyes and teeth checked. Stay up to date on all vaccines. This information is not intended to replace advice given to you by your health care provider. Make sure you discuss any questions you have with your health care provider. Document Revised: 04/11/2021 Document Reviewed: 04/11/2021 Elsevier Patient Education  2024 [employer].

## 2023-03-24 ENCOUNTER — Telehealth: Payer: Medicare HMO

## 2023-03-25 ENCOUNTER — Other Ambulatory Visit: Payer: Self-pay | Admitting: *Deleted

## 2023-03-25 DIAGNOSIS — D72829 Elevated white blood cell count, unspecified: Secondary | ICD-10-CM

## 2023-03-25 NOTE — Progress Notes (Signed)
Her white blood cell count is elevated but the rest of her labs are all stable. Recommend she come back in 1-2 weeks to recheck CBC with differential. We can recheck everything else in a year.  Katina Degree. [name], MD 03/25/2023 9:30 AM

## 2023-03-26 DIAGNOSIS — M8580 Other specified disorders of bone density and structure, unspecified site: Secondary | ICD-10-CM | POA: Diagnosis not present

## 2023-03-26 DIAGNOSIS — G20C Parkinsonism, unspecified: Secondary | ICD-10-CM | POA: Diagnosis not present

## 2023-03-26 DIAGNOSIS — E46 Unspecified protein-calorie malnutrition: Secondary | ICD-10-CM | POA: Diagnosis not present

## 2023-03-26 DIAGNOSIS — I517 Cardiomegaly: Secondary | ICD-10-CM | POA: Diagnosis not present

## 2023-03-26 DIAGNOSIS — R911 Solitary pulmonary nodule: Secondary | ICD-10-CM | POA: Diagnosis not present

## 2023-03-26 DIAGNOSIS — K5900 Constipation, unspecified: Secondary | ICD-10-CM | POA: Diagnosis not present

## 2023-03-26 DIAGNOSIS — M1990 Unspecified osteoarthritis, unspecified site: Secondary | ICD-10-CM | POA: Diagnosis not present

## 2023-03-26 DIAGNOSIS — F411 Generalized anxiety disorder: Secondary | ICD-10-CM | POA: Diagnosis not present

## 2023-03-26 DIAGNOSIS — M5030 Other cervical disc degeneration, unspecified cervical region: Secondary | ICD-10-CM | POA: Diagnosis not present

## 2023-03-27 ENCOUNTER — Telehealth: Payer: Self-pay | Admitting: Pharmacist

## 2023-03-27 NOTE — Progress Notes (Signed)
Care Management & Coordination Services Pharmacy Team  Reason for Encounter: Medication coordination and delivery  Contacted patient to discuss medications and coordinate delivery from [facility]. Spoke with patient on 03/27/2023  Cycle dispensing form sent to [facility] for review.   Last adherence delivery date: 03/06/2023  Patient is due for next adherence delivery on: 04/07/2023  This delivery to include: Vials  30 Days  Pantoprazole 40 mg once daily Carbidopa-Levodopa 25-250 mg three times daily Melatonin 3 mg at bedtime Diazepam 5 mg 1/2-1 tablet as needed Bicarsim Forte 125 mg 2 tabs in morning and bedtime  No refill request needed.  Confirmed delivery date of 04/07/2023, advised patient that pharmacy will contact them the morning of delivery.   Any concerns about your medications? No  How often do you forget or accidentally miss a dose? Rarely  Do you use a pillbox? No  Is patient in packaging No  If yes  What is the date on your next pill pack?  Any concerns or issues with your packaging?    Chart review: Recent office visits:  03/21/2023 [facility] (PCP) Ardith Dark, MD; no medication changes indicated.  Recent consult visits:  None  Hospital visits:  None in previous 6 months  Medications: Outpatient Encounter Medications as of 03/27/2023  Medication Sig   Amino Acids-Protein Hydrolys (FEEDING SUPPLEMENT, PRO-STAT 64,) LIQD Take 30 mLs by mouth 3 (three) times daily with meals.   Ascorbic Acid (VITAMIN C PO) Take 1 tablet by mouth daily.   b complex vitamins capsule Take 1 capsule by mouth daily.   carbidopa-levodopa (SINEMET IR) 25-250 MG tablet Take 1 tablet by mouth 4 (four) times daily.   Cholecalciferol (VITAMIN D) 2000 UNITS CAPS Take 2,000 Units by mouth daily.   diazepam (VALIUM) 5 MG tablet Take 1 tablet (5 mg total) by mouth every 12 (twelve) hours as needed for anxiety.   feeding supplement (ENSURE ENLIVE / ENSURE PLUS) LIQD Take 237 mLs  by mouth 3 (three) times daily between meals.   glucosamine-chondroitin 500-400 MG tablet Take 1 tablet by mouth daily.   hydrocortisone cream 1 % Apply topically daily.   ibuprofen (ADVIL) 200 MG tablet Take 200 mg by mouth every 6 (six) hours as needed.   lidocaine (LIDODERM) 5 % Place 1 patch onto the skin daily. Remove & Discard patch within 12 hours or as directed by MD   magnesium 30 MG tablet Take 30 mg by mouth 2 (two) times daily.   Multiple Vitamin (MULTIVITAMIN) capsule Take 1 capsule by mouth daily.   Omega-3 Fatty Acids (OMEGA 3 PO) Take 1 tablet by mouth daily.   pantoprazole (PROTONIX) 40 MG tablet Take 1 tablet (40 mg total) by mouth daily.   Simethicone 125 MG TABS Take 2 tablets (250 mg total) by mouth in the morning and at bedtime.   tetrahydrozoline-zinc (VISINE-AC) 0.05-0.25 % ophthalmic solution Place 2 drops into both eyes 4 (four) times daily as needed (allergies).   vitamin B-12 (CYANOCOBALAMIN) 1000 MCG tablet Take 1,000 mcg by mouth daily.   [DISCONTINUED] amantadine (SYMMETREL) 100 MG capsule Take 1 capsule (100 mg total) by mouth 2 (two) times daily. (Patient not taking: Reported on 09/21/2019)   No facility-administered encounter medications on file as of 03/27/2023.   BP Readings from Last 3 Encounters:  03/21/23 120/82  01/13/23 136/89  12/10/22 136/82    Pulse Readings from Last 3 Encounters:  03/21/23 99  01/13/23 95  12/10/22 (!) 106    Lab Results  Component Value Date/Time   HGBA1C 5.4 03/21/2023 02:50 PM   HGBA1C 5.4 01/19/2022 04:36 AM   Lab Results  Component Value Date   CREATININE 0.66 03/21/2023   BUN 14 03/21/2023   GFR 87.23 03/21/2023   GFRNONAA >60 01/13/2023   GFRAA >60 01/31/2020   NA 140 03/21/2023   K 4.2 03/21/2023   CALCIUM 10.0 03/21/2023   CO2 27 03/21/2023     Future Appointments  Date Time Provider Department Center  04/08/2023  2:00 PM [facility] LAB [facility]  09/22/2023 11:45 AM Erroll Luna, RPH [facility]  None  01/01/2024  1:00 PM [facility] ANNUAL WELLNESS VISIT 1 [facility]  03/26/2024  2:00 PM Ardith Dark, MD [facility]   April D Calhoun, [facility] Clinical Pharmacist Assistant [phone]

## 2023-03-28 DIAGNOSIS — K5900 Constipation, unspecified: Secondary | ICD-10-CM | POA: Diagnosis not present

## 2023-03-28 DIAGNOSIS — F411 Generalized anxiety disorder: Secondary | ICD-10-CM | POA: Diagnosis not present

## 2023-03-28 DIAGNOSIS — R911 Solitary pulmonary nodule: Secondary | ICD-10-CM | POA: Diagnosis not present

## 2023-03-28 DIAGNOSIS — M8580 Other specified disorders of bone density and structure, unspecified site: Secondary | ICD-10-CM | POA: Diagnosis not present

## 2023-03-28 DIAGNOSIS — I517 Cardiomegaly: Secondary | ICD-10-CM | POA: Diagnosis not present

## 2023-03-28 DIAGNOSIS — G20C Parkinsonism, unspecified: Secondary | ICD-10-CM | POA: Diagnosis not present

## 2023-03-28 DIAGNOSIS — E46 Unspecified protein-calorie malnutrition: Secondary | ICD-10-CM | POA: Diagnosis not present

## 2023-03-28 DIAGNOSIS — M1990 Unspecified osteoarthritis, unspecified site: Secondary | ICD-10-CM | POA: Diagnosis not present

## 2023-03-28 DIAGNOSIS — M5030 Other cervical disc degeneration, unspecified cervical region: Secondary | ICD-10-CM | POA: Diagnosis not present

## 2023-03-31 ENCOUNTER — Telehealth: Payer: Self-pay | Admitting: Family Medicine

## 2023-03-31 NOTE — Telephone Encounter (Signed)
Patient's daughter Melvinie requests to be called for status of VA and Physical forms/paperwork  Also, requests to be advised if Dr. [name] will fill out FMLA paperwork for Melvinie to assist Patient with medical needs, appointments, etc.

## 2023-03-31 NOTE — Telephone Encounter (Signed)
Please advise 

## 2023-04-01 DIAGNOSIS — G20C Parkinsonism, unspecified: Secondary | ICD-10-CM | POA: Diagnosis not present

## 2023-04-01 DIAGNOSIS — E46 Unspecified protein-calorie malnutrition: Secondary | ICD-10-CM | POA: Diagnosis not present

## 2023-04-01 DIAGNOSIS — M1990 Unspecified osteoarthritis, unspecified site: Secondary | ICD-10-CM | POA: Diagnosis not present

## 2023-04-01 DIAGNOSIS — F411 Generalized anxiety disorder: Secondary | ICD-10-CM | POA: Diagnosis not present

## 2023-04-01 DIAGNOSIS — I517 Cardiomegaly: Secondary | ICD-10-CM | POA: Diagnosis not present

## 2023-04-01 DIAGNOSIS — R911 Solitary pulmonary nodule: Secondary | ICD-10-CM | POA: Diagnosis not present

## 2023-04-01 DIAGNOSIS — M8580 Other specified disorders of bone density and structure, unspecified site: Secondary | ICD-10-CM | POA: Diagnosis not present

## 2023-04-01 DIAGNOSIS — K5900 Constipation, unspecified: Secondary | ICD-10-CM | POA: Diagnosis not present

## 2023-04-01 DIAGNOSIS — M5030 Other cervical disc degeneration, unspecified cervical region: Secondary | ICD-10-CM | POA: Diagnosis not present

## 2023-04-01 NOTE — Telephone Encounter (Signed)
This was filled out last week.  [name] [name]. [name], MD 04/01/2023 10:21 AM

## 2023-04-04 DIAGNOSIS — I517 Cardiomegaly: Secondary | ICD-10-CM | POA: Diagnosis not present

## 2023-04-04 DIAGNOSIS — R911 Solitary pulmonary nodule: Secondary | ICD-10-CM | POA: Diagnosis not present

## 2023-04-04 DIAGNOSIS — M1990 Unspecified osteoarthritis, unspecified site: Secondary | ICD-10-CM | POA: Diagnosis not present

## 2023-04-04 DIAGNOSIS — E46 Unspecified protein-calorie malnutrition: Secondary | ICD-10-CM | POA: Diagnosis not present

## 2023-04-04 DIAGNOSIS — K5900 Constipation, unspecified: Secondary | ICD-10-CM | POA: Diagnosis not present

## 2023-04-04 DIAGNOSIS — M8580 Other specified disorders of bone density and structure, unspecified site: Secondary | ICD-10-CM | POA: Diagnosis not present

## 2023-04-04 DIAGNOSIS — F411 Generalized anxiety disorder: Secondary | ICD-10-CM | POA: Diagnosis not present

## 2023-04-04 DIAGNOSIS — G20C Parkinsonism, unspecified: Secondary | ICD-10-CM | POA: Diagnosis not present

## 2023-04-04 DIAGNOSIS — M5030 Other cervical disc degeneration, unspecified cervical region: Secondary | ICD-10-CM | POA: Diagnosis not present

## 2023-04-08 ENCOUNTER — Other Ambulatory Visit: Payer: Medicare HMO

## 2023-04-09 DIAGNOSIS — I517 Cardiomegaly: Secondary | ICD-10-CM | POA: Diagnosis not present

## 2023-04-09 DIAGNOSIS — G20C Parkinsonism, unspecified: Secondary | ICD-10-CM | POA: Diagnosis not present

## 2023-04-09 DIAGNOSIS — M8580 Other specified disorders of bone density and structure, unspecified site: Secondary | ICD-10-CM | POA: Diagnosis not present

## 2023-04-09 DIAGNOSIS — F411 Generalized anxiety disorder: Secondary | ICD-10-CM | POA: Diagnosis not present

## 2023-04-09 DIAGNOSIS — M5030 Other cervical disc degeneration, unspecified cervical region: Secondary | ICD-10-CM | POA: Diagnosis not present

## 2023-04-09 DIAGNOSIS — M1990 Unspecified osteoarthritis, unspecified site: Secondary | ICD-10-CM | POA: Diagnosis not present

## 2023-04-09 DIAGNOSIS — E46 Unspecified protein-calorie malnutrition: Secondary | ICD-10-CM | POA: Diagnosis not present

## 2023-04-09 DIAGNOSIS — R911 Solitary pulmonary nodule: Secondary | ICD-10-CM | POA: Diagnosis not present

## 2023-04-09 DIAGNOSIS — K5900 Constipation, unspecified: Secondary | ICD-10-CM | POA: Diagnosis not present

## 2023-04-10 DIAGNOSIS — G20C Parkinsonism, unspecified: Secondary | ICD-10-CM | POA: Diagnosis not present

## 2023-04-10 DIAGNOSIS — M1990 Unspecified osteoarthritis, unspecified site: Secondary | ICD-10-CM | POA: Diagnosis not present

## 2023-04-10 DIAGNOSIS — M8580 Other specified disorders of bone density and structure, unspecified site: Secondary | ICD-10-CM | POA: Diagnosis not present

## 2023-04-10 DIAGNOSIS — I517 Cardiomegaly: Secondary | ICD-10-CM | POA: Diagnosis not present

## 2023-04-10 DIAGNOSIS — M5030 Other cervical disc degeneration, unspecified cervical region: Secondary | ICD-10-CM | POA: Diagnosis not present

## 2023-04-10 DIAGNOSIS — K5900 Constipation, unspecified: Secondary | ICD-10-CM | POA: Diagnosis not present

## 2023-04-10 DIAGNOSIS — E46 Unspecified protein-calorie malnutrition: Secondary | ICD-10-CM | POA: Diagnosis not present

## 2023-04-10 DIAGNOSIS — F411 Generalized anxiety disorder: Secondary | ICD-10-CM | POA: Diagnosis not present

## 2023-04-10 DIAGNOSIS — R911 Solitary pulmonary nodule: Secondary | ICD-10-CM | POA: Diagnosis not present

## 2023-04-15 ENCOUNTER — Other Ambulatory Visit (INDEPENDENT_AMBULATORY_CARE_PROVIDER_SITE_OTHER): Payer: Medicare HMO

## 2023-04-15 DIAGNOSIS — D72829 Elevated white blood cell count, unspecified: Secondary | ICD-10-CM

## 2023-04-15 LAB — CBC WITH DIFFERENTIAL/PLATELET
Basophils Absolute: 0 10*3/uL (ref 0.0–0.1)
Basophils Relative: 0.5 % (ref 0.0–3.0)
Eosinophils Absolute: 0.2 10*3/uL (ref 0.0–0.7)
Eosinophils Relative: 2.2 % (ref 0.0–5.0)
HCT: 40.5 % (ref 36.0–46.0)
Hemoglobin: 13.2 g/dL (ref 12.0–15.0)
Lymphocytes Relative: 19 % (ref 12.0–46.0)
Lymphs Abs: 1.4 10*3/uL (ref 0.7–4.0)
MCHC: 32.6 g/dL (ref 30.0–36.0)
MCV: 90.4 fl (ref 78.0–100.0)
Monocytes Absolute: 0.7 10*3/uL (ref 0.1–1.0)
Monocytes Relative: 9.7 % (ref 3.0–12.0)
Neutro Abs: 5.1 10*3/uL (ref 1.4–7.7)
Neutrophils Relative %: 68.6 % (ref 43.0–77.0)
Platelets: 166 10*3/uL (ref 150.0–400.0)
RBC: 4.48 Mil/uL (ref 3.87–5.11)
RDW: 13 % (ref 11.5–15.5)
WBC: 7.4 10*3/uL (ref 4.0–10.5)

## 2023-04-16 DIAGNOSIS — R911 Solitary pulmonary nodule: Secondary | ICD-10-CM | POA: Diagnosis not present

## 2023-04-16 DIAGNOSIS — G20C Parkinsonism, unspecified: Secondary | ICD-10-CM | POA: Diagnosis not present

## 2023-04-16 DIAGNOSIS — M1990 Unspecified osteoarthritis, unspecified site: Secondary | ICD-10-CM | POA: Diagnosis not present

## 2023-04-16 DIAGNOSIS — F411 Generalized anxiety disorder: Secondary | ICD-10-CM | POA: Diagnosis not present

## 2023-04-16 DIAGNOSIS — M8580 Other specified disorders of bone density and structure, unspecified site: Secondary | ICD-10-CM | POA: Diagnosis not present

## 2023-04-16 DIAGNOSIS — E46 Unspecified protein-calorie malnutrition: Secondary | ICD-10-CM | POA: Diagnosis not present

## 2023-04-16 DIAGNOSIS — M5030 Other cervical disc degeneration, unspecified cervical region: Secondary | ICD-10-CM | POA: Diagnosis not present

## 2023-04-16 DIAGNOSIS — K5900 Constipation, unspecified: Secondary | ICD-10-CM | POA: Diagnosis not present

## 2023-04-16 DIAGNOSIS — I517 Cardiomegaly: Secondary | ICD-10-CM | POA: Diagnosis not present

## 2023-04-16 NOTE — Progress Notes (Signed)
Great news!  Blood count is back to normal.  Do not need to do any further testing at this point.

## 2023-04-23 DIAGNOSIS — G20C Parkinsonism, unspecified: Secondary | ICD-10-CM | POA: Diagnosis not present

## 2023-04-23 DIAGNOSIS — K5900 Constipation, unspecified: Secondary | ICD-10-CM | POA: Diagnosis not present

## 2023-04-23 DIAGNOSIS — F411 Generalized anxiety disorder: Secondary | ICD-10-CM | POA: Diagnosis not present

## 2023-04-23 DIAGNOSIS — M1990 Unspecified osteoarthritis, unspecified site: Secondary | ICD-10-CM | POA: Diagnosis not present

## 2023-04-23 DIAGNOSIS — M5030 Other cervical disc degeneration, unspecified cervical region: Secondary | ICD-10-CM | POA: Diagnosis not present

## 2023-04-23 DIAGNOSIS — R911 Solitary pulmonary nodule: Secondary | ICD-10-CM | POA: Diagnosis not present

## 2023-04-23 DIAGNOSIS — E46 Unspecified protein-calorie malnutrition: Secondary | ICD-10-CM | POA: Diagnosis not present

## 2023-04-23 DIAGNOSIS — M8580 Other specified disorders of bone density and structure, unspecified site: Secondary | ICD-10-CM | POA: Diagnosis not present

## 2023-04-23 DIAGNOSIS — I517 Cardiomegaly: Secondary | ICD-10-CM | POA: Diagnosis not present

## 2023-04-24 DIAGNOSIS — M1990 Unspecified osteoarthritis, unspecified site: Secondary | ICD-10-CM | POA: Diagnosis not present

## 2023-04-24 DIAGNOSIS — M8580 Other specified disorders of bone density and structure, unspecified site: Secondary | ICD-10-CM | POA: Diagnosis not present

## 2023-04-24 DIAGNOSIS — R911 Solitary pulmonary nodule: Secondary | ICD-10-CM | POA: Diagnosis not present

## 2023-04-24 DIAGNOSIS — M5030 Other cervical disc degeneration, unspecified cervical region: Secondary | ICD-10-CM | POA: Diagnosis not present

## 2023-04-24 DIAGNOSIS — I517 Cardiomegaly: Secondary | ICD-10-CM | POA: Diagnosis not present

## 2023-04-24 DIAGNOSIS — F411 Generalized anxiety disorder: Secondary | ICD-10-CM | POA: Diagnosis not present

## 2023-04-24 DIAGNOSIS — K5900 Constipation, unspecified: Secondary | ICD-10-CM | POA: Diagnosis not present

## 2023-04-24 DIAGNOSIS — E46 Unspecified protein-calorie malnutrition: Secondary | ICD-10-CM | POA: Diagnosis not present

## 2023-04-24 DIAGNOSIS — G20C Parkinsonism, unspecified: Secondary | ICD-10-CM | POA: Diagnosis not present

## 2023-04-29 DIAGNOSIS — E46 Unspecified protein-calorie malnutrition: Secondary | ICD-10-CM | POA: Diagnosis not present

## 2023-04-29 DIAGNOSIS — M5030 Other cervical disc degeneration, unspecified cervical region: Secondary | ICD-10-CM | POA: Diagnosis not present

## 2023-04-29 DIAGNOSIS — M1990 Unspecified osteoarthritis, unspecified site: Secondary | ICD-10-CM | POA: Diagnosis not present

## 2023-04-29 DIAGNOSIS — K5900 Constipation, unspecified: Secondary | ICD-10-CM | POA: Diagnosis not present

## 2023-04-29 DIAGNOSIS — G20C Parkinsonism, unspecified: Secondary | ICD-10-CM | POA: Diagnosis not present

## 2023-04-29 DIAGNOSIS — M8580 Other specified disorders of bone density and structure, unspecified site: Secondary | ICD-10-CM | POA: Diagnosis not present

## 2023-04-29 DIAGNOSIS — R911 Solitary pulmonary nodule: Secondary | ICD-10-CM | POA: Diagnosis not present

## 2023-04-29 DIAGNOSIS — I517 Cardiomegaly: Secondary | ICD-10-CM | POA: Diagnosis not present

## 2023-04-29 DIAGNOSIS — F411 Generalized anxiety disorder: Secondary | ICD-10-CM | POA: Diagnosis not present

## 2023-05-05 ENCOUNTER — Other Ambulatory Visit: Payer: Self-pay | Admitting: *Deleted

## 2023-05-20 ENCOUNTER — Telehealth: Payer: Medicare HMO | Admitting: Family Medicine

## 2023-05-20 VITALS — Ht 67.0 in | Wt 120.0 lb

## 2023-05-20 DIAGNOSIS — G20A1 Parkinson's disease without dyskinesia, without mention of fluctuations: Secondary | ICD-10-CM

## 2023-05-20 NOTE — Assessment & Plan Note (Signed)
On Sinemet however symptoms are not controlled.  She is seeing neurology through [facility] who had recommended that she see a Parkinson's disease specialist.  We did place referral to local movement disorder however they were not able to accept the transfer and recommended she be referred to tertiary care center.  Will refer to [facility].

## 2023-05-20 NOTE — Progress Notes (Signed)
   [name] [name] is a 72 y.o. female who presents today for a virtual office visit.  Assessment/Plan:  Chronic Problems Addressed Today: Parkinson disease (HCC) On Sinemet however symptoms are not controlled.  She is seeing neurology through [facility] who had recommended that she see a Parkinson's disease specialist.  We did place referral to local movement disorder however they were not able to accept the transfer and recommended she be referred to tertiary care center.  Will refer to [facility] movement disorder clinic.     Subjective:  HPI:  See Assessment / plan for status of chronic conditions. She is here with her daughter today. Their main concern today is Parkinson Disease. We last saw her a couple of months ago. At that time her neurologist, Dr Langston Masker, had recommend that she be referred to a Parkinson Disease specialist. We had referred her to local neurology.  They did not accept referral and recommended she be referred to tertiary care center.       Objective/Observations  Physical Exam: Gen: NAD, resting comfortably Pulm: Normal work of breathing Neuro: Grossly normal, moves all extremities Psych: Normal affect and thought content  Virtual Visit via Video   I connected with [name] [name] on 05/20/23 at  9:20 AM EDT by a video enabled telemedicine application and verified that I am speaking with the correct person using two identifiers. The limitations of evaluation and management by telemedicine and the availability of in person appointments were discussed. The patient expressed understanding and agreed to proceed.   Patient location: Home Provider location: [place] [employer] Persons participating in the virtual visit: Myself and Patient     Katina Degree. [name], MD 05/20/2023 10:03 AM

## 2023-07-07 ENCOUNTER — Ambulatory Visit: Payer: Medicare HMO | Admitting: Family Medicine

## 2023-07-09 ENCOUNTER — Telehealth: Payer: Self-pay | Admitting: Family Medicine

## 2023-07-09 ENCOUNTER — Other Ambulatory Visit: Payer: Self-pay | Admitting: Family Medicine

## 2023-07-09 NOTE — Telephone Encounter (Signed)
Patient requesting call back , states she has been having issues contacting [facility] for delivery of diazepam (VALIUM) 5 MG tablet and carbidopa-levodopa (SINEMET IR) 25-250 MG tablet  ( informed records show this was not prescribed by Dr.parker) . Please advise . States if we are unable to contact [facility] , would like prescriptions switched over to  [id number] - [place], [place] - [address] AT [facility] [address] & [address] Phone: [phone]  Fax: [phone]

## 2023-07-09 NOTE — Telephone Encounter (Signed)
Please advise 

## 2023-07-10 NOTE — Telephone Encounter (Signed)
We can send in the valium but the sinemet needs to come from her neurologist.  Katina Degree. [name], MD 07/10/2023 12:17 PM

## 2023-07-15 ENCOUNTER — Ambulatory Visit (INDEPENDENT_AMBULATORY_CARE_PROVIDER_SITE_OTHER): Payer: Medicare HMO | Admitting: Family Medicine

## 2023-07-15 ENCOUNTER — Encounter: Payer: Self-pay | Admitting: Family Medicine

## 2023-07-15 VITALS — BP 108/70 | HR 97 | Temp 98.0°F | Ht 67.0 in | Wt 105.4 lb

## 2023-07-15 DIAGNOSIS — E538 Deficiency of other specified B group vitamins: Secondary | ICD-10-CM

## 2023-07-15 DIAGNOSIS — M5030 Other cervical disc degeneration, unspecified cervical region: Secondary | ICD-10-CM

## 2023-07-15 DIAGNOSIS — F419 Anxiety disorder, unspecified: Secondary | ICD-10-CM

## 2023-07-15 DIAGNOSIS — R5383 Other fatigue: Secondary | ICD-10-CM

## 2023-07-15 DIAGNOSIS — Z23 Encounter for immunization: Secondary | ICD-10-CM | POA: Diagnosis not present

## 2023-07-15 DIAGNOSIS — M159 Polyosteoarthritis, unspecified: Secondary | ICD-10-CM

## 2023-07-15 LAB — CBC
HCT: 42.5 % (ref 36.0–46.0)
Hemoglobin: 13.7 g/dL (ref 12.0–15.0)
MCHC: 32.2 g/dL (ref 30.0–36.0)
MCV: 90.9 fl (ref 78.0–100.0)
Platelets: 145 10*3/uL — ABNORMAL LOW (ref 150.0–400.0)
RBC: 4.68 Mil/uL (ref 3.87–5.11)
RDW: 13.2 % (ref 11.5–15.5)
WBC: 6.7 10*3/uL (ref 4.0–10.5)

## 2023-07-15 MED ORDER — METHYLPREDNISOLONE ACETATE 80 MG/ML IJ SUSP
80.0000 mg | Freq: Once | INTRAMUSCULAR | Status: AC
  Administered 2023-07-15: 80 mg via INTRAMUSCULAR

## 2023-07-15 MED ORDER — EZY DOSE PILL CUTTER MISC: 1.0000 | Freq: Every day | 0 refills | Status: AC

## 2023-07-15 MED ORDER — CYANOCOBALAMIN 1000 MCG/ML IJ SOLN
1000.0000 ug | Freq: Once | INTRAMUSCULAR | Status: AC
  Administered 2023-07-15: 1000 ug via INTRAMUSCULAR

## 2023-07-15 MED ORDER — DULOXETINE HCL 20 MG PO CPEP: 20.0000 mg | ORAL_CAPSULE | Freq: Every day | ORAL | 3 refills | Status: DC

## 2023-07-15 NOTE — Patient Instructions (Signed)
It was very nice to see you today!  We will give you a steroid shot today.  I will refer you for physical therapy.  Will give you B12 shot today.  Will check blood work today.  Will review flu shot today.  We will start Cymbalta.  I hope this will help with your anxiety as well as your pain.  Return in about 2 weeks (around 07/29/2023) for Follow Up.   Take care, Dr [name]  PLEASE NOTE:  If you had any lab tests, please let us know if you have not heard back within a few days. You may see your results on mychart before we have a chance to review them but we will give you a call once they are reviewed by [place].   If we ordered any referrals today, please let us know if you have not heard from their office within the next week.   If you had any urgent prescriptions sent in today, please check with the pharmacy within an hour of our visit to make sure the prescription was transmitted appropriately.   Please try these tips to maintain a healthy lifestyle:  Eat at least 3 REAL meals and 1-2 snacks per day.  Aim for no more than 5 hours between eating.  If you eat breakfast, please do so within one hour of getting up.   Each meal should contain half fruits/vegetables, one quarter protein, and one quarter carbs (no bigger than a computer mouse)  Cut down on sweet beverages. This includes juice, soda, and sweet tea.   Drink at least 1 glass of water with each meal and aim for at least 8 glasses per day  Exercise at least 150 minutes every week.

## 2023-07-15 NOTE — Progress Notes (Signed)
   [name] [name] is a 72 y.o. female who presents today for an office visit.  Assessment/Plan:  Chronic Problems Addressed Today: Anxiety Currently on valium 2.5 to 5 mg twice daily as needed.  Still having some breakthrough panic attacks.  She has tried a few different medications to help with anxiety and panic in the past including Seroquel, Lexapro, and BuSpar but could not tolerate due to side effects.  We discussed further treatment options.  Due to concurrent chronic pain would be reasonable to try low-dose Cymbalta.  Will start 20 mg daily.  Discussed potential side effects.  He will follow-up with me in a few weeks via MyChart and we can titrate the dose as needed.  If she does not do with Cymbalta would consider low-dose amitriptyline versus referral to psychiatry at that point.  B12 deficiency Will check B12 level today.  She is having more issues with fatigue and brain fog and would like to restart B12 injections.  Will give injection today after blood draw.  DDD (degenerative disc disease), cervical Doing with acute flare.  No red flags.  Reassuring exam today.  Obvious precipitating events.  She did well with Depo-Medrol injection last year for similar issue.  Will repeat this today.  Also place referral for her to see physical therapy.  She will let us know if not proving in the next few weeks and will refer to orthopedics or sports medicine.  We discussed reasons to return to care and seek emergent care.  Fatigue Check labs today.  She feels like her B12 is low.  Will recheck this today and restart B12 protocol.  Preventative health care-flu shot given today.     Subjective:  HPI:  See Assessment / plan for status of chronic conditions.  Patient here today for follow-up.  Seen 2 months ago.  Since her last visit, she has had worsening issues with anxiety.  She is currently on Valium 2.5 to 5 mg twice daily as needed however does not feel like this is effective as it was  previously.  She has been getting more panic attacks.  Having about 2-3 panic attacks per week.  She has tried several other anxiety medications in the past including Lexapro, BuSpar, and Seroquel but she did not tolerate due to side effects.  She also has had ongoing issues with neck pain for the last several weeks.  Tried over-the-counter medications with some improvement.  She had a similar flare about a year ago that resolved with steroids.  No obvious injuries or precipitating events.  No weakness or numbness.  Symptoms have been stable.       Objective:  Physical Exam: BP 108/70   Pulse 97   Temp 98 F (36.7 C) (Oral)   Ht 5\' 7"  (1.702 m)   Wt 105 lb 6.4 oz (47.8 kg)   SpO2 100%   BMI 16.51 kg/m   Gen: No acute distress, resting comfortably CV: Regular rate and rhythm with no murmurs appreciated Pulm: Normal work of breathing, clear to auscultation bilaterally with no crackles, wheezes, or rhonchi Neuro: Grossly normal, moves all extremities Psych: Normal affect and thought content      [name] [name] M. [name], MD 07/15/2023 1:56 PM

## 2023-07-15 NOTE — Assessment & Plan Note (Signed)
Doing with acute flare.  No red flags.  Reassuring exam today.  Obvious precipitating events.  She did well with Depo-Medrol injection last year for similar issue.  Will repeat this today.  Also place referral for her to see physical therapy.  She will let us know if not proving in the next few weeks and will refer to orthopedics or sports medicine.  We discussed reasons to return to care and seek emergent care.

## 2023-07-15 NOTE — Assessment & Plan Note (Signed)
Currently on valium 2.5 to 5 mg twice daily as needed.  Still having some breakthrough panic attacks.  She has tried a few different medications to help with anxiety and panic in the past including Seroquel, Lexapro, and BuSpar but could not tolerate due to side effects.  We discussed further treatment options.  Due to concurrent chronic pain would be reasonable to try low-dose Cymbalta.  Will start 20 mg daily.  Discussed potential side effects.  He will follow-up with me in a few weeks via MyChart and we can titrate the dose as needed.  If she does not do with Cymbalta would consider low-dose amitriptyline versus referral to psychiatry at that point.

## 2023-07-15 NOTE — Assessment & Plan Note (Signed)
Will check B12 level today.  She is having more issues with fatigue and brain fog and would like to restart B12 injections.  Will give injection today after blood draw.

## 2023-07-15 NOTE — Addendum Note (Signed)
Addended by: Dyann Kief on: 07/15/2023 02:17 PM   Modules accepted: Orders

## 2023-07-15 NOTE — Assessment & Plan Note (Signed)
Check labs today.  She feels like her B12 is low.  Will recheck this today and restart B12 protocol.

## 2023-07-16 NOTE — Telephone Encounter (Signed)
Patient had an OV yesterday 07/15/2023

## 2023-07-18 NOTE — Progress Notes (Signed)
Her sugar was a little low at the rest her labs are all stable.  Her B12 levels are at goal.  She can continue with B12 though do not think this is significantly impacting her fatigue at this point.  I am worried that her low sugar may be causing some of her symptoms.  Can we have her come back to recheck another level?  She needs to make sure that she is eating something every couple of hours if possible.  Please place order for CMET, C-peptide, and insulin level.  This should be a fasting blood draw.  [name] [name]. [name], MD 07/18/2023 12:45 PM

## 2023-07-20 DIAGNOSIS — M159 Polyosteoarthritis, unspecified: Secondary | ICD-10-CM | POA: Diagnosis not present

## 2023-07-20 DIAGNOSIS — G8929 Other chronic pain: Secondary | ICD-10-CM | POA: Diagnosis not present

## 2023-07-20 DIAGNOSIS — F410 Panic disorder [episodic paroxysmal anxiety] without agoraphobia: Secondary | ICD-10-CM | POA: Diagnosis not present

## 2023-07-20 DIAGNOSIS — I517 Cardiomegaly: Secondary | ICD-10-CM | POA: Diagnosis not present

## 2023-07-20 DIAGNOSIS — Z9181 History of falling: Secondary | ICD-10-CM | POA: Diagnosis not present

## 2023-07-20 DIAGNOSIS — G20A1 Parkinson's disease without dyskinesia, without mention of fluctuations: Secondary | ICD-10-CM | POA: Diagnosis not present

## 2023-07-20 DIAGNOSIS — E538 Deficiency of other specified B group vitamins: Secondary | ICD-10-CM | POA: Diagnosis not present

## 2023-07-20 DIAGNOSIS — M5030 Other cervical disc degeneration, unspecified cervical region: Secondary | ICD-10-CM | POA: Diagnosis not present

## 2023-07-31 ENCOUNTER — Other Ambulatory Visit: Payer: Self-pay | Admitting: *Deleted

## 2023-07-31 DIAGNOSIS — R7309 Other abnormal glucose: Secondary | ICD-10-CM

## 2023-07-31 DIAGNOSIS — O26819 Pregnancy related exhaustion and fatigue, unspecified trimester: Secondary | ICD-10-CM

## 2023-08-01 ENCOUNTER — Other Ambulatory Visit: Payer: Self-pay | Admitting: Family Medicine

## 2023-08-01 DIAGNOSIS — Z1212 Encounter for screening for malignant neoplasm of rectum: Secondary | ICD-10-CM

## 2023-08-01 DIAGNOSIS — F410 Panic disorder [episodic paroxysmal anxiety] without agoraphobia: Secondary | ICD-10-CM | POA: Diagnosis not present

## 2023-08-01 DIAGNOSIS — E538 Deficiency of other specified B group vitamins: Secondary | ICD-10-CM | POA: Diagnosis not present

## 2023-08-01 DIAGNOSIS — G8929 Other chronic pain: Secondary | ICD-10-CM | POA: Diagnosis not present

## 2023-08-01 DIAGNOSIS — Z1211 Encounter for screening for malignant neoplasm of colon: Secondary | ICD-10-CM

## 2023-08-01 DIAGNOSIS — M159 Polyosteoarthritis, unspecified: Secondary | ICD-10-CM | POA: Diagnosis not present

## 2023-08-01 DIAGNOSIS — Z9181 History of falling: Secondary | ICD-10-CM | POA: Diagnosis not present

## 2023-08-01 DIAGNOSIS — G20A1 Parkinson's disease without dyskinesia, without mention of fluctuations: Secondary | ICD-10-CM | POA: Diagnosis not present

## 2023-08-01 DIAGNOSIS — I517 Cardiomegaly: Secondary | ICD-10-CM | POA: Diagnosis not present

## 2023-08-01 DIAGNOSIS — M5030 Other cervical disc degeneration, unspecified cervical region: Secondary | ICD-10-CM | POA: Diagnosis not present

## 2023-08-07 DIAGNOSIS — F410 Panic disorder [episodic paroxysmal anxiety] without agoraphobia: Secondary | ICD-10-CM | POA: Diagnosis not present

## 2023-08-07 DIAGNOSIS — E538 Deficiency of other specified B group vitamins: Secondary | ICD-10-CM | POA: Diagnosis not present

## 2023-08-07 DIAGNOSIS — I517 Cardiomegaly: Secondary | ICD-10-CM | POA: Diagnosis not present

## 2023-08-07 DIAGNOSIS — G8929 Other chronic pain: Secondary | ICD-10-CM | POA: Diagnosis not present

## 2023-08-07 DIAGNOSIS — G20A1 Parkinson's disease without dyskinesia, without mention of fluctuations: Secondary | ICD-10-CM | POA: Diagnosis not present

## 2023-08-07 DIAGNOSIS — M159 Polyosteoarthritis, unspecified: Secondary | ICD-10-CM | POA: Diagnosis not present

## 2023-08-07 DIAGNOSIS — Z9181 History of falling: Secondary | ICD-10-CM | POA: Diagnosis not present

## 2023-08-07 DIAGNOSIS — M5030 Other cervical disc degeneration, unspecified cervical region: Secondary | ICD-10-CM | POA: Diagnosis not present

## 2023-08-08 ENCOUNTER — Telehealth: Payer: Self-pay | Admitting: Family Medicine

## 2023-08-08 NOTE — Telephone Encounter (Signed)
Patient requests to be called to be advised if it is okay for Patient to take  DULoxetine (CYMBALTA) 20 MG capsule  at night instead of early in the morning due to causing drowsiness

## 2023-08-08 NOTE — Telephone Encounter (Signed)
Please advise 

## 2023-08-11 NOTE — Telephone Encounter (Signed)
Patient notified ok to take Rx Cymbalta at night  Verbalized understanding

## 2023-08-11 NOTE — Telephone Encounter (Signed)
That is ok.  Katina Degree. [name], MD 08/11/2023 11:19 AM

## 2023-08-14 DIAGNOSIS — Z9181 History of falling: Secondary | ICD-10-CM | POA: Diagnosis not present

## 2023-08-14 DIAGNOSIS — G20A1 Parkinson's disease without dyskinesia, without mention of fluctuations: Secondary | ICD-10-CM | POA: Diagnosis not present

## 2023-08-14 DIAGNOSIS — I517 Cardiomegaly: Secondary | ICD-10-CM | POA: Diagnosis not present

## 2023-08-14 DIAGNOSIS — G8929 Other chronic pain: Secondary | ICD-10-CM | POA: Diagnosis not present

## 2023-08-14 DIAGNOSIS — M159 Polyosteoarthritis, unspecified: Secondary | ICD-10-CM | POA: Diagnosis not present

## 2023-08-14 DIAGNOSIS — F410 Panic disorder [episodic paroxysmal anxiety] without agoraphobia: Secondary | ICD-10-CM | POA: Diagnosis not present

## 2023-08-14 DIAGNOSIS — M5030 Other cervical disc degeneration, unspecified cervical region: Secondary | ICD-10-CM | POA: Diagnosis not present

## 2023-08-14 DIAGNOSIS — E538 Deficiency of other specified B group vitamins: Secondary | ICD-10-CM | POA: Diagnosis not present

## 2023-08-22 ENCOUNTER — Ambulatory Visit (INDEPENDENT_AMBULATORY_CARE_PROVIDER_SITE_OTHER): Payer: Medicare HMO | Admitting: Family Medicine

## 2023-08-22 ENCOUNTER — Encounter: Payer: Self-pay | Admitting: Family Medicine

## 2023-08-22 VITALS — BP 108/75 | HR 84 | Temp 97.7°F | Ht 67.0 in | Wt 100.0 lb

## 2023-08-22 DIAGNOSIS — F419 Anxiety disorder, unspecified: Secondary | ICD-10-CM

## 2023-08-22 DIAGNOSIS — E162 Hypoglycemia, unspecified: Secondary | ICD-10-CM | POA: Diagnosis not present

## 2023-08-22 DIAGNOSIS — G20A1 Parkinson's disease without dyskinesia, without mention of fluctuations: Secondary | ICD-10-CM

## 2023-08-22 DIAGNOSIS — R059 Cough, unspecified: Secondary | ICD-10-CM | POA: Diagnosis not present

## 2023-08-22 DIAGNOSIS — D259 Leiomyoma of uterus, unspecified: Secondary | ICD-10-CM

## 2023-08-22 DIAGNOSIS — R5383 Other fatigue: Secondary | ICD-10-CM

## 2023-08-22 LAB — COMPREHENSIVE METABOLIC PANEL
ALT: 9 U/L (ref 0–35)
AST: 17 U/L (ref 0–37)
Albumin: 4.2 g/dL (ref 3.5–5.2)
Alkaline Phosphatase: 54 U/L (ref 39–117)
BUN: 13 mg/dL (ref 6–23)
CO2: 28 meq/L (ref 19–32)
Calcium: 9.9 mg/dL (ref 8.4–10.5)
Chloride: 104 meq/L (ref 96–112)
Creatinine, Ser: 0.61 mg/dL (ref 0.40–1.20)
GFR: 88.64 mL/min (ref >60.00)
Glucose, Bld: 82 mg/dL (ref 70–99)
Potassium: 4.1 meq/L (ref 3.5–5.1)
Sodium: 139 meq/L (ref 135–145)
Total Bilirubin: 0.6 mg/dL (ref 0.2–1.2)
Total Protein: 7.1 g/dL (ref 6.0–8.3)

## 2023-08-22 MED ORDER — AMITRIPTYLINE HCL 10 MG PO TABS: 5.0000 mg | ORAL_TABLET | Freq: Every day | ORAL | 5 refills | Status: DC

## 2023-08-22 MED ORDER — BENZONATATE 200 MG PO CAPS: 200.0000 mg | ORAL_CAPSULE | Freq: Two times a day (BID) | ORAL | 0 refills | Status: AC | PRN

## 2023-08-22 MED ORDER — AMOXICILLIN 875 MG PO TABS: 875.0000 mg | ORAL_TABLET | Freq: Two times a day (BID) | ORAL | 0 refills | Status: DC

## 2023-08-22 NOTE — Assessment & Plan Note (Signed)
Incidentally found on CT scan a few months ago.  She is interested in seeing a gynecologist for this.  She will call to schedule appointment however she will let us know if she needs referral.

## 2023-08-22 NOTE — Progress Notes (Signed)
   [name] [name] is a 72 y.o. female who presents today for an office visit.  Assessment/Plan:  New/Acute Problems: Hypoglycemia Glucose 63 on last c-Met.  Likely due to fasting state.she has not had any obvious hypoglycemic events.  Will check CMET, insulin and C peptide today.  Discussed importance of routine food intake.  Cough  No red flags.  Reassuring exam today.  Likely viral URI.  Will start Tessalon.  She can use her other over-the-counter meds as needed.  Will send in pocket prescription for amoxicillin with instruction to not start unless symptoms fail to improve over the next several days.  Chronic Problems Addressed Today: Anxiety Unfortunately she did not tolerate the Cymbalta.  She is no longer on this.  She has tried several medications in the past now including Seroquel, Lexapro, BuSpar and Cymbalta without much improvement and additionally has had significant side effects with all these.  We did discuss alternative treatment options.  Will try very low-dose amitriptyline 5 mg nightly.  We discussed potential side effects.  If she is not able to tolerate this or if she does not have adequate control with this would consider referral to psychiatry.  Fibroid uterus Incidentally found on CT scan a few months ago.  She is interested in seeing a gynecologist for this.  She will call to schedule appointment however she will let us know if she needs referral.  Fatigue Improved since our last visit.  Parkinson disease [name] [name]) Following with neurology.  She is currently on Sinemet.  She does feel like symptoms have been improving the last several weeks.      Subjective:  HPI:  See Assessment / plan for status of chronic conditions.   Patient is here today for follow up. We last saw her about a month ago. At our last visit she was having significant issues with anxiety. We started her on Cymbalta. Unfortunately she had side effects to this and had to stop after a few weeks. She  does not think that it helped with her anxiety. She has been been on multiple medications in the past and had to stop due to side effects.  Anxiety still been high.  She feels anxious and nervous frequently.  At her last visit he was also incidentally found to have a low sugar on her labs.  She has not had any obvious hypoglycemic events.  She was instructed to come back for labs and would like to have this done today.  She is also been having more cough and congestion last few weeks.  No fevers or chills.  No shortness of breath.  She has tried over-the-counter cough medication with some improvement.       Objective:  Physical Exam: BP 108/75   Pulse 84   Temp 97.7 F (36.5 C) (Temporal)   Ht 5\' 7"  (1.702 m)   Wt 100 lb (45.4 kg)   SpO2 98%   BMI 15.66 kg/m   Gen: No acute distress, resting comfortably CV: Regular rate and rhythm with no murmurs appreciated Pulm: Normal work of breathing, clear to auscultation bilaterally with no crackles, wheezes, or rhonchi Neuro: Grossly normal, moves all extremities Psych: Normal affect and thought content      Kirandeep Fariss M. [name], MD 08/22/2023 12:19 PM

## 2023-08-22 NOTE — Patient Instructions (Signed)
It was very nice to see you today!  We will please try taking 5 mg of amitriptyline at night.  Sending message in a few weeks to let me know how this is working for you.  Please try the Tessalon for your cough.  Start amoxicillin if not improving.  Please make sure that you are getting plenty of fluids and staying hydrated.  Return if symptoms worsen or fail to improve.   Take care, Dr [name]  PLEASE NOTE:  If you had any lab tests, please let us know if you have not heard back within a few days. You may see your results on mychart before we have a chance to review them but we will give you a call once they are reviewed by [place].   If we ordered any referrals today, please let us know if you have not heard from their office within the next week.   If you had any urgent prescriptions sent in today, please check with the pharmacy within an hour of our visit to make sure the prescription was transmitted appropriately.   Please try these tips to maintain a healthy lifestyle:  Eat at least 3 REAL meals and 1-2 snacks per day.  Aim for no more than 5 hours between eating.  If you eat breakfast, please do so within one hour of getting up.   Each meal should contain half fruits/vegetables, one quarter protein, and one quarter carbs (no bigger than a computer mouse)  Cut down on sweet beverages. This includes juice, soda, and sweet tea.   Drink at least 1 glass of water with each meal and aim for at least 8 glasses per day  Exercise at least 150 minutes every week.

## 2023-08-22 NOTE — Assessment & Plan Note (Signed)
Improved since our last visit.

## 2023-08-22 NOTE — Assessment & Plan Note (Signed)
Following with neurology.  She is currently on Sinemet.  She does feel like symptoms have been improving the last several weeks.

## 2023-08-22 NOTE — Assessment & Plan Note (Signed)
Unfortunately she did not tolerate the Cymbalta.  She is no longer on this.  She has tried several medications in the past now including Seroquel, Lexapro, BuSpar and Cymbalta without much improvement and additionally has had significant side effects with all these.  We did discuss alternative treatment options.  Will try very low-dose amitriptyline 5 mg nightly.  We discussed potential side effects.  If she is not able to tolerate this or if she does not have adequate control with this would consider referral to psychiatry.

## 2023-08-25 LAB — C-PEPTIDE: C-Peptide: 2.84 ng/mL (ref 0.80–3.85)

## 2023-08-25 LAB — INSULIN, RANDOM: Insulin: 5.3 u[IU]/mL

## 2023-08-26 NOTE — Progress Notes (Signed)
Labs are all normal.  Do not need to do any further testing at this time.

## 2023-08-28 DIAGNOSIS — Z9181 History of falling: Secondary | ICD-10-CM | POA: Diagnosis not present

## 2023-08-28 DIAGNOSIS — E538 Deficiency of other specified B group vitamins: Secondary | ICD-10-CM | POA: Diagnosis not present

## 2023-08-28 DIAGNOSIS — F410 Panic disorder [episodic paroxysmal anxiety] without agoraphobia: Secondary | ICD-10-CM | POA: Diagnosis not present

## 2023-08-28 DIAGNOSIS — G20A1 Parkinson's disease without dyskinesia, without mention of fluctuations: Secondary | ICD-10-CM | POA: Diagnosis not present

## 2023-08-28 DIAGNOSIS — G8929 Other chronic pain: Secondary | ICD-10-CM | POA: Diagnosis not present

## 2023-08-28 DIAGNOSIS — M159 Polyosteoarthritis, unspecified: Secondary | ICD-10-CM | POA: Diagnosis not present

## 2023-08-28 DIAGNOSIS — M5030 Other cervical disc degeneration, unspecified cervical region: Secondary | ICD-10-CM | POA: Diagnosis not present

## 2023-08-28 DIAGNOSIS — I517 Cardiomegaly: Secondary | ICD-10-CM | POA: Diagnosis not present

## 2023-09-04 DIAGNOSIS — F410 Panic disorder [episodic paroxysmal anxiety] without agoraphobia: Secondary | ICD-10-CM | POA: Diagnosis not present

## 2023-09-04 DIAGNOSIS — E538 Deficiency of other specified B group vitamins: Secondary | ICD-10-CM | POA: Diagnosis not present

## 2023-09-04 DIAGNOSIS — M5030 Other cervical disc degeneration, unspecified cervical region: Secondary | ICD-10-CM | POA: Diagnosis not present

## 2023-09-04 DIAGNOSIS — G8929 Other chronic pain: Secondary | ICD-10-CM | POA: Diagnosis not present

## 2023-09-04 DIAGNOSIS — Z9181 History of falling: Secondary | ICD-10-CM | POA: Diagnosis not present

## 2023-09-04 DIAGNOSIS — G20A1 Parkinson's disease without dyskinesia, without mention of fluctuations: Secondary | ICD-10-CM | POA: Diagnosis not present

## 2023-09-04 DIAGNOSIS — I517 Cardiomegaly: Secondary | ICD-10-CM | POA: Diagnosis not present

## 2023-09-04 DIAGNOSIS — M159 Polyosteoarthritis, unspecified: Secondary | ICD-10-CM | POA: Diagnosis not present

## 2023-09-16 DIAGNOSIS — I517 Cardiomegaly: Secondary | ICD-10-CM | POA: Diagnosis not present

## 2023-09-16 DIAGNOSIS — G20A1 Parkinson's disease without dyskinesia, without mention of fluctuations: Secondary | ICD-10-CM | POA: Diagnosis not present

## 2023-09-16 DIAGNOSIS — Z9181 History of falling: Secondary | ICD-10-CM | POA: Diagnosis not present

## 2023-09-16 DIAGNOSIS — G8929 Other chronic pain: Secondary | ICD-10-CM | POA: Diagnosis not present

## 2023-09-16 DIAGNOSIS — M5030 Other cervical disc degeneration, unspecified cervical region: Secondary | ICD-10-CM | POA: Diagnosis not present

## 2023-09-16 DIAGNOSIS — E538 Deficiency of other specified B group vitamins: Secondary | ICD-10-CM | POA: Diagnosis not present

## 2023-09-16 DIAGNOSIS — F410 Panic disorder [episodic paroxysmal anxiety] without agoraphobia: Secondary | ICD-10-CM | POA: Diagnosis not present

## 2023-09-16 DIAGNOSIS — M159 Polyosteoarthritis, unspecified: Secondary | ICD-10-CM | POA: Diagnosis not present

## 2023-09-22 ENCOUNTER — Telehealth: Payer: Self-pay | Admitting: Family Medicine

## 2023-09-22 ENCOUNTER — Encounter: Payer: Medicare HMO | Admitting: Pharmacist

## 2023-09-22 NOTE — Telephone Encounter (Signed)
Home Health Verbal Orders  Agency:  [facility]  Caller: Dara Hoyer and title  Requesting OT/ PT/ Skilled nursing/ Social Work/ Speech:    Reason for Request:  FYI - Requested Palative care referral. They will go get an assessment  Frequency:    HH needs F2F w/in last 30 days     [phone]

## 2023-09-22 NOTE — Telephone Encounter (Signed)
Please advise 

## 2023-09-22 NOTE — Telephone Encounter (Signed)
Ok with me. Please place any necessary orders. 

## 2023-09-23 NOTE — Telephone Encounter (Signed)
Dennison Bulla at [phone]  LVM with ok VO per Dr [name]

## 2023-09-24 ENCOUNTER — Telehealth: Payer: Self-pay | Admitting: Family Medicine

## 2023-09-24 NOTE — Telephone Encounter (Signed)
Home Health Verbal Orders  Agency:  [facility]  Caller:  PJ  Contact and title  Requesting OT/ PT/ Skilled nursing/ Social Work/ Speech:    Reason for Request:  Order for Hospice referral, need to know if you want to be attending, do you want to manage symptoms or Hospice Dr. Please advise.  Frequency:    HH needs F2F w/in last 30 days     [phone] FAX: [phone]

## 2023-09-29 NOTE — Telephone Encounter (Signed)
Adding to the message below, family do not want Pallative, just Hospice.

## 2023-09-30 NOTE — Telephone Encounter (Signed)
[facility] called back and I let them know Dr [name] will be pts attending and they will fax [place] their form to send back for Hospice. Just an [place]

## 2023-09-30 NOTE — Telephone Encounter (Signed)
Ok with me. Please place any necessary orders. 

## 2023-09-30 NOTE — Telephone Encounter (Signed)
See note

## 2023-10-03 ENCOUNTER — Telehealth: Payer: Medicare HMO | Admitting: Family Medicine

## 2023-10-03 ENCOUNTER — Telehealth: Payer: Self-pay | Admitting: Family Medicine

## 2023-10-03 NOTE — Telephone Encounter (Signed)
Received faxed document hospice poc , to be filled out by provider. Patient requested to send it back via Fax . Document is located in providers tray at front office.Please advise.

## 2023-10-08 NOTE — Telephone Encounter (Signed)
Form placed at PCP office to be reviewed

## 2023-10-09 NOTE — Telephone Encounter (Signed)
Copy of PHY given to patient  Copy placed to be scan in patient chart

## 2023-11-04 ENCOUNTER — Other Ambulatory Visit: Payer: Self-pay | Admitting: Family Medicine

## 2023-11-06 DIAGNOSIS — Z743 Need for continuous supervision: Secondary | ICD-10-CM | POA: Diagnosis not present

## 2023-11-06 DIAGNOSIS — R69 Illness, unspecified: Secondary | ICD-10-CM | POA: Diagnosis not present

## 2023-12-24 ENCOUNTER — Telehealth: Payer: Self-pay | Admitting: Family Medicine

## 2023-12-24 NOTE — Telephone Encounter (Signed)
 Received faxed document Home Health Certificate (Order ID [id number]), to be filled out by provider. Patient requested to send it back via Fax . Document is located in providers tray at front office.Please advise

## 2023-12-25 NOTE — Telephone Encounter (Signed)
Form placed in PCP office to be reviewed  

## 2024-01-01 ENCOUNTER — Telehealth: Payer: Self-pay | Admitting: Family Medicine

## 2024-01-01 ENCOUNTER — Ambulatory Visit: Payer: Medicare HMO

## 2024-01-01 VITALS — Ht 67.0 in | Wt 110.0 lb

## 2024-01-01 DIAGNOSIS — Z1231 Encounter for screening mammogram for malignant neoplasm of breast: Secondary | ICD-10-CM

## 2024-01-01 DIAGNOSIS — Z0000 Encounter for general adult medical examination without abnormal findings: Secondary | ICD-10-CM

## 2024-01-01 NOTE — Progress Notes (Signed)
 Subjective:   [name] [name] is a 73 y.o. who presents for a Medicare Wellness preventive visit.  Visit Complete: Virtual I connected with  Elwyn Reach on 01/01/24 by a audio enabled telemedicine application and verified that I am speaking with the correct person using two identifiers.  Patient Location: Home  Provider Location: Office/Clinic  I discussed the limitations of evaluation and management by telemedicine. The patient expressed understanding and agreed to proceed.  Vital Signs: Because this visit was a virtual/telehealth visit, some criteria may be missing or patient reported. Any vitals not documented were not able to be obtained and vitals that have been documented are patient reported.  VideoDeclined- This patient declined Librarian, academic. Therefore the visit was completed with audio only.  AWV Questionnaire: No: Patient Medicare AWV questionnaire was not completed prior to this visit.  Cardiac Risk Factors include: advanced age (>54men, >21 women)     Objective:    Today's Vitals   01/01/24 1313  Weight: 110 lb (49.9 kg)  Height: 5\' 7"  (1.702 m)   Body mass index is 17.23 kg/m.     01/01/2024    1:23 PM 01/13/2023    4:40 PM 12/17/2022   11:46 AM 04/17/2022    5:12 PM 01/17/2022    2:07 PM 12/14/2021    3:15 PM 01/31/2020    8:32 PM  Advanced Directives  Does Patient Have a Medical Advance Directive? Yes No No No No No No  Type of Estate agent of [place];Living will        Copy of Healthcare Power of Attorney in Chart? No - copy requested        Would patient like information on creating a medical advance directive?  Yes (ED - Information included in AVS) No - Patient declined Yes (ED - Information included in AVS) No - Patient declined No - Patient declined No - Patient declined    Current Medications (verified) Outpatient Encounter Medications as of 01/01/2024  Medication Sig   Amino Acids-Protein  Hydrolys (FEEDING SUPPLEMENT, PRO-STAT 64,) LIQD Take 30 mLs by mouth 3 (three) times daily with meals.   Ascorbic Acid (VITAMIN C PO) Take 1 tablet by mouth daily.   b complex vitamins capsule Take 1 capsule by mouth daily.   benzonatate (TESSALON) 200 MG capsule Take 1 capsule (200 mg total) by mouth 2 (two) times daily as needed for cough.   Cholecalciferol (VITAMIN D) 2000 UNITS CAPS Take 2,000 Units by mouth daily.   diazepam (VALIUM) 5 MG tablet TAKE 1/2 TO 1 TABLET(2.5 TO 5 MG) BY MOUTH EVERY 12 HOURS AS NEEDED FOR ANXIETY   feeding supplement (ENSURE ENLIVE / ENSURE PLUS) LIQD Take 237 mLs by mouth 3 (three) times daily between meals.   glucosamine-chondroitin 500-400 MG tablet Take 1 tablet by mouth daily.   hydrocortisone cream 1 % Apply topically daily.   ibuprofen (ADVIL) 200 MG tablet Take 200 mg by mouth every 6 (six) hours as needed.   lidocaine (LIDODERM) 5 % Place 1 patch onto the skin daily. Remove & Discard patch within 12 hours or as directed by MD   magnesium 30 MG tablet Take 30 mg by mouth 2 (two) times daily.   Multiple Vitamin (MULTIVITAMIN) capsule Take 1 capsule by mouth daily.   Omega-3 Fatty Acids (OMEGA 3 PO) Take 1 tablet by mouth daily.   pantoprazole (PROTONIX) 40 MG tablet TAKE 1 TABLET BY MOUTH ONCE DAILY   Simethicone 125 MG  TABS Take 2 tablets (250 mg total) by mouth in the morning and at bedtime.   tetrahydrozoline-zinc (VISINE-AC) 0.05-0.25 % ophthalmic solution Place 2 drops into both eyes 4 (four) times daily as needed (allergies).   vitamin B-12 (CYANOCOBALAMIN) 1000 MCG tablet Take 1,000 mcg by mouth daily.   amitriptyline (ELAVIL) 10 MG tablet Take 0.5 tablets (5 mg total) by mouth at bedtime. (Patient not taking: Reported on 01/01/2024)   carbidopa-levodopa (SINEMET IR) 25-250 MG tablet Take 1 tablet by mouth 4 (four) times daily.   [DISCONTINUED] amantadine (SYMMETREL) 100 MG capsule Take 1 capsule (100 mg total) by mouth 2 (two) times daily. (Patient  not taking: Reported on 09/21/2019)   [DISCONTINUED] amoxicillin (AMOXIL) 875 MG tablet Take 1 tablet (875 mg total) by mouth 2 (two) times daily.   No facility-administered encounter medications on file as of 01/01/2024.    Allergies (verified) Sulfa antibiotics   History: Past Medical History:  Diagnosis Date   Fibroid    GERD (gastroesophageal reflux disease)    Muscle weakness of lower extremity 01/17/2015   Osteopenia    Parkinson disease (HCC) 01/17/2015   Past Surgical History:  Procedure Laterality Date   FINGER SURGERY     INGUINAL HERNIA REPAIR Left 01/17/2022   Procedure: Left femoral hernia repair with mesh and small bowel resection and anastomosis;  Surgeon: Kinsinger, De Blanch, MD;  Location: [facility] OR;  Service: General;  Laterality: Left;   MOUTH SURGERY     TOE SURGERY     TONSILLECTOMY     TUBAL LIGATION     x2   Family History  Problem Relation Age of Onset   Rheum arthritis Mother    Breast cancer Mother        38's   Cancer Father        throat   Breast cancer Sister        66's   Hypertension Brother    Hyperlipidemia Brother    Heart attack Son        Deceased, 45   Healthy Daughter    Parkinsonism Neg Hx    Social History   Socioeconomic History   Marital status: Divorced    Spouse name: Not on file   Number of children: 4   Years of education: B.A.   Highest education level: Not on file  Occupational History   Occupation: SECRETARY  Tobacco Use   Smoking status: Never   Smokeless tobacco: Never  Vaping Use   Vaping status: Never Used  Substance and Sexual Activity   Alcohol use: No    Alcohol/week: 0.0 standard drinks of alcohol   Drug use: No   Sexual activity: Never  Other Topics Concern   Not on file  Social History Narrative   Patient is right handed.   Patient drinks 2-3 cups of caffeine per day.   Lives alone in a 2 story home.  Has 4 living children.  1 passed away at age 57.     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   [employer] level of education: college   Social Drivers of Corporate investment banker Strain: Low Risk  (01/01/2024)   Overall Financial Resource Strain (CARDIA)    Difficulty of Paying Living Expenses: Not hard at all  Food Insecurity: No Food Insecurity (01/01/2024)   Hunger Vital Sign    Worried About Running Out of Food in the Last Year: Never true    Ran Out of Food  in the Last Year: Never true  Transportation Needs: No Transportation Needs (01/01/2024)   PRAPARE - Administrator, Civil Service (Medical): No    Lack of Transportation (Non-Medical): No  Physical Activity: Insufficiently Active (01/01/2024)   Exercise Vital Sign    Days of Exercise per Week: 5 days    Minutes of Exercise per Session: 20 min  Stress: No Stress Concern Present (01/01/2024)   [employer] of Occupational Health - Occupational Stress Questionnaire    Feeling of Stress : Only a little  Social Connections: Moderately Isolated (01/01/2024)   Social Connection and Isolation Panel [NHANES]    Frequency of Communication with Friends and Family: More than three times a week    Frequency of Social Gatherings with Friends and Family: Three times a week    Attends Religious Services: More than 4 times per year    Active Member of Clubs or Organizations: No    Attends Banker Meetings: Never    Marital Status: Divorced    Tobacco Counseling Counseling given: Not Answered    Clinical Intake:  Pre-visit preparation completed: Yes  Pain : No/denies pain     BMI - recorded: 17.23 Nutritional Status: BMI <19  Underweight Diabetes: No  How often do you need to have someone help you when you read instructions, pamphlets, or other written materials from your doctor or pharmacy?: 1 - Never  Interpreter Needed?: No  Information entered by :: Lanier Ensign, LPN   Activities of Daily Living      01/01/2024    1:15 PM  In your present state of health, do  you have any difficulty performing the following activities:  Hearing? 0  Vision? 0  Difficulty concentrating or making decisions? 0  Walking or climbing stairs? 1  Comment uses a walker  Dressing or bathing? 1  Comment assistance  Doing errands, shopping? 0  Preparing Food and eating ? Y  Comment assistance  Using the Toilet? N  In the past six months, have you accidently leaked urine? N  Do you have problems with loss of bowel control? N  Managing your Medications? N  Managing your Finances? N  Housekeeping or managing your Housekeeping? Y  Comment assistance    Patient Care Team: Ardith Dark, MD as PCP - General (Family Medicine) York Spaniel, MD (Inactive) as Consulting Physician (Neurology) Josph Macho, MD as Consulting Physician (Oncology) Erroll Luna, [facility] (Inactive) (Pharmacist)  Indicate any recent Medical Services you may have received from other than [facility] providers in the past year (date may be approximate).     Assessment:   This is a routine wellness examination for [name] [name].  Hearing/Vision screen Hearing Screening - Comments:: Pt denies any hearing issues  Vision Screening - Comments:: Pt follows up with eye provider for annual eye exams    Goals Addressed             This Visit's Progress    Patient Stated       To be able to take care of self as much as possible to maintain independence       Depression Screen      01/01/2024    1:20 PM 07/15/2023    1:29 PM 05/20/2023    9:23 AM 05/20/2023    9:19 AM 03/21/2023    1:47 PM 12/17/2022   11:43 AM 12/10/2022    1:35 PM  PHQ 2/9 Scores  PHQ - 2 Score 0 0 0  0 0 0 0    Fall Risk      01/01/2024    1:24 PM 07/15/2023    1:22 PM 05/20/2023    9:23 AM 05/20/2023    9:19 AM 03/21/2023    1:47 PM  Fall Risk   Falls in the past year? 0 0 0 0 0  Number falls in past yr: 0 0 0 0 0  Injury with Fall? 0 0 0 0 0  Risk for fall due to : Impaired balance/gait;Impaired mobility Impaired  mobility No Fall Risks No Fall Risks History of fall(s)  Follow up Falls prevention discussed Falls evaluation completed Falls evaluation completed Falls evaluation completed     MEDICARE RISK AT HOME:   Medicare Risk at Home Any stairs in or around the home?: Yes If so, are there any without handrails?: No Home free of loose throw rugs in walkways, pet beds, electrical cords, etc?: Yes Adequate lighting in your home to reduce risk of falls?: Yes Life alert?: No Use of a cane, walker or w/c?: Yes Grab bars in the bathroom?: No Shower chair or bench in shower?: Yes Elevated toilet seat or a handicapped toilet?: Yes  TIMED UP AND GO:  Was the test performed?  No  Cognitive Function: 6CIT completed        01/01/2024    1:24 PM 12/17/2022   11:49 AM 12/14/2021    3:19 PM 09/21/2019    4:21 PM  6CIT Screen  What Year? 0 points 0 points 0 points 0 points  What month? 0 points 0 points 0 points 0 points  What time? 0 points 0 points 0 points 0 points  Count back from 20 0 points 0 points 0 points 0 points  Months in reverse 0 points 2 points 0 points 0 points  Repeat phrase 4 points 2 points 4 points 0 points  Total Score 4 points 4 points 4 points 0 points    Immunizations Immunization History  Administered Date(s) Administered   Fluad Quad(high Dose 65+) 08/05/2019, 09/25/2021, 12/10/2022   Fluad Trivalent(High Dose 65+) 07/15/2023   PNEUMOCOCCAL CONJUGATE-20 09/25/2021    Screening Tests Health Maintenance  Topic Date Due   DTaP/Tdap/Td (1 - Tdap) Never done   Fecal DNA (Cologuard)  Never done   Zoster Vaccines- Shingrix (1 of 2) Never done   MAMMOGRAM  07/12/2023   Pneumonia Vaccine 3+ Years old  Completed   INFLUENZA VACCINE  Completed   DEXA SCAN  Completed   Hepatitis C Screening  Completed   HPV VACCINES  Aged Out   Colonoscopy  Discontinued   COVID-19 Vaccine  Discontinued    Health Maintenance  Health Maintenance Due  Topic Date Due   DTaP/Tdap/Td  (1 - Tdap) Never done   Fecal DNA (Cologuard)  Never done   Zoster Vaccines- Shingrix (1 of 2) Never done   MAMMOGRAM  07/12/2023   Health Maintenance Items Addressed: Mammogram ordered, Pt stated she has the cologuard kit   Additional Screening:  Vision Screening: Recommended annual ophthalmology exams for early detection of glaucoma and other disorders of the eye.  Dental Screening: Recommended annual dental exams for proper oral hygiene  Community Resource Referral / Chronic Care Management: CRR required this visit?  No   CCM required this visit?  No     Plan:     I have personally reviewed and noted the following in the patient's chart:   Medical and social history Use of alcohol, tobacco or illicit  drugs  Current medications and supplements including opioid prescriptions. Patient is not currently taking opioid prescriptions. Functional ability and status Nutritional status Physical activity Advanced directives List of other physicians Hospitalizations, surgeries, and ER visits in previous 12 months Vitals Screenings to include cognitive, depression, and falls Referrals and appointments  In addition, I have reviewed and discussed with patient certain preventive protocols, quality metrics, and best practice recommendations. A written personalized care plan for preventive services as well as general preventive health recommendations were provided to patient.     Marzella Schlein, LPN   11/02/1094   After Visit Summary: (MyChart) Due to this being a telephonic visit, the after visit summary with patients personalized plan was offered to patient via MyChart   Notes: Nothing significant to report at this time.

## 2024-01-01 NOTE — Telephone Encounter (Signed)
 Erroneous encounter

## 2024-01-01 NOTE — Patient Instructions (Signed)
 [name] , Thank you for taking time to come for your Medicare Wellness Visit. I appreciate your ongoing commitment to your health goals. Please review the following plan we discussed and let me know if I can assist you in the future.   Referrals/Orders/Follow-Ups/Clinician Recommendations: maintain independence as much as possible   This is a list of the screening recommended for you and due dates:  Health Maintenance  Topic Date Due   DTaP/Tdap/Td vaccine (1 - Tdap) Never done   Cologuard (Stool DNA test)  Never done   Zoster (Shingles) Vaccine (1 of 2) Never done   Mammogram  07/12/2023   Pneumonia Vaccine  Completed   Flu Shot  Completed   DEXA scan (bone density measurement)  Completed   Hepatitis C Screening  Completed   HPV Vaccine  Aged Out   Colon Cancer Screening  Discontinued   COVID-19 Vaccine  Discontinued    Advanced directives: (Copy Requested) Please bring a copy of your health care power of attorney and living will to the office to be added to your chart at your convenience.  Next Medicare Annual Wellness Visit scheduled for next year: Yes

## 2024-02-02 ENCOUNTER — Telehealth: Payer: Self-pay | Admitting: Family Medicine

## 2024-02-02 NOTE — Telephone Encounter (Signed)
Placed to be reviewed in PCP office

## 2024-02-02 NOTE — Telephone Encounter (Signed)
 Received faxed  document Home Health Certificate (Order ID [id number]), to be filled out by provider. Patient requested to send it back via Fax . Document is located in providers tray at front office.Please advise

## 2024-02-04 NOTE — Telephone Encounter (Signed)
 Form faxed and placed to be scan in patient chart

## 2024-03-14 IMAGING — DX DG ABD PORTABLE 1V
1 series · 1 of 1 positions shown · non-contrast
Comparison: CT abdomen and pelvis 01/17/2022

CLINICAL DATA: Ileus. History of incarcerated left femoral hernia
and status post hernia repair and small bowel resection.

EXAM:
PORTABLE ABDOMEN - 1 VIEW

[abdomen kub]
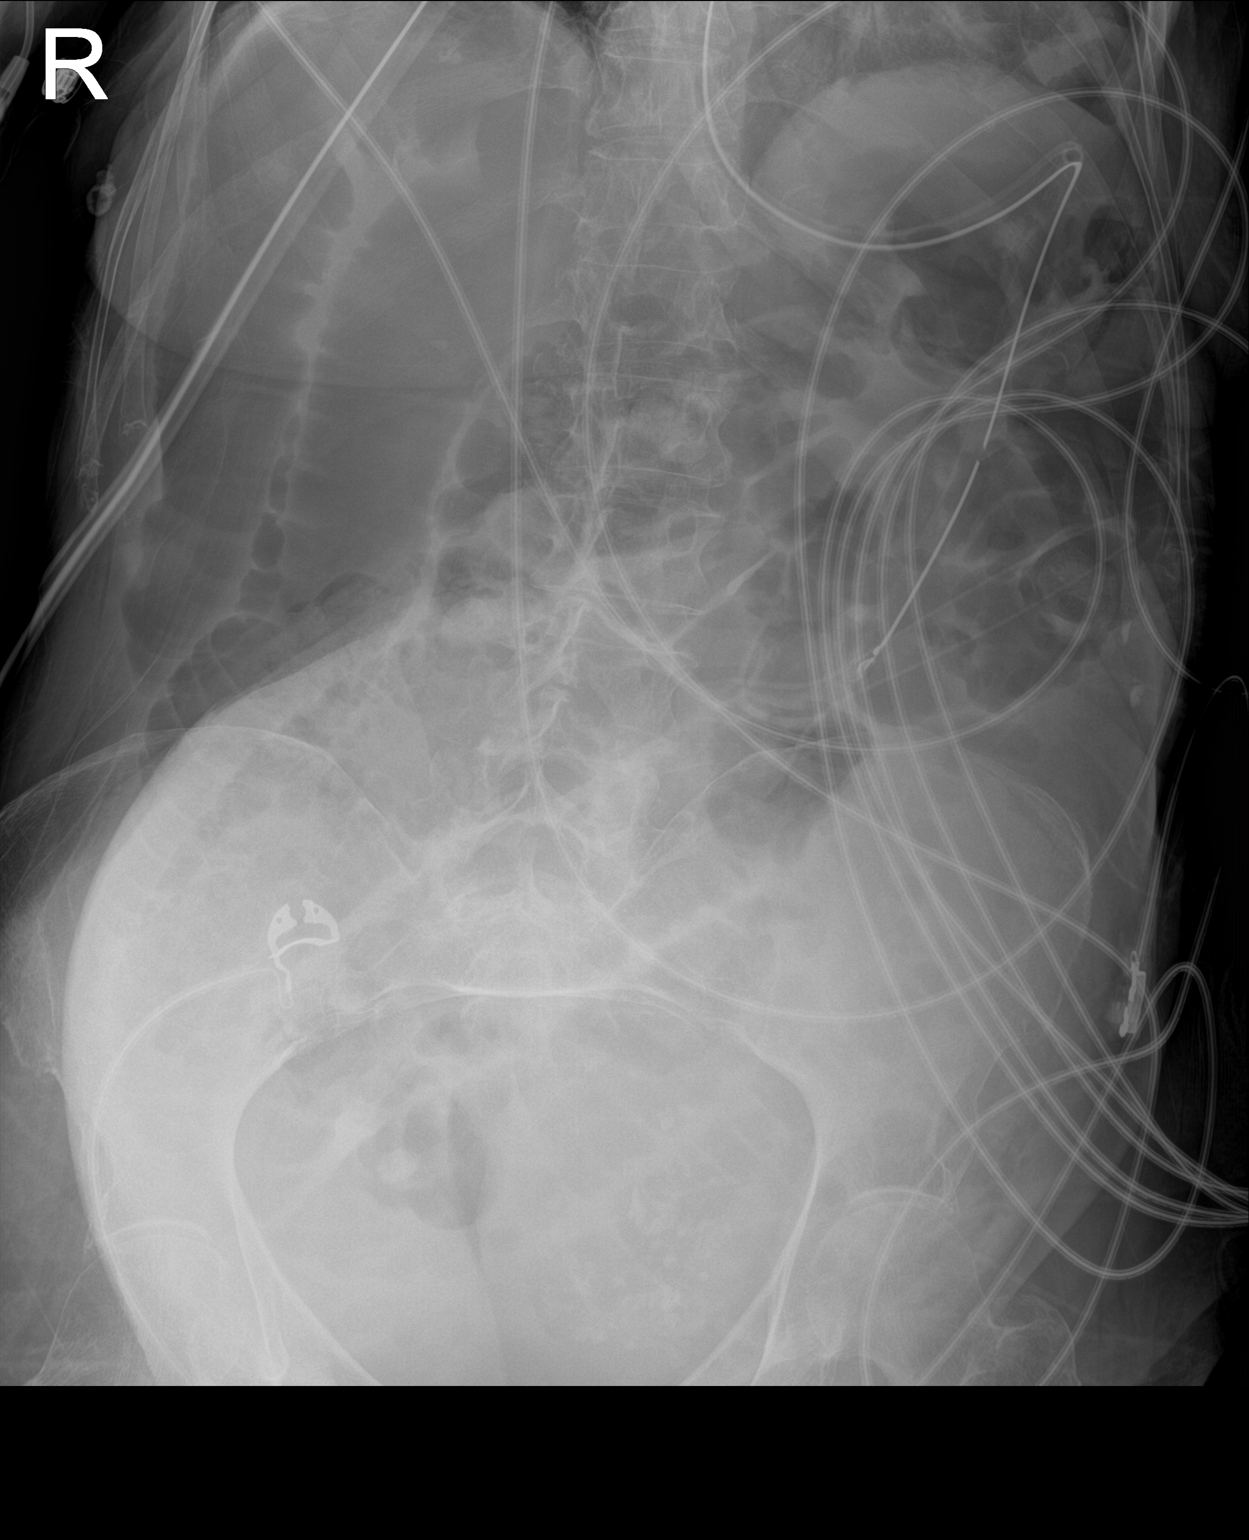

[1 of 1 positions shown; findings below may reference images not displayed]

FINDINGS: Single view of the abdomen demonstrates a feeding tube in the left
abdomen which is likely in the stomach body region. Gas-filled loops
of small bowel throughout the abdomen and pelvis. Pelvic
calcifications compatible with known fibroid uterus. Limited
evaluation for free air on this portable supine view.
IMPRESSION: Diffusely dilated loops of small bowel throughout the abdomen and
pelvis. Findings are suggestive for a postoperative ileus.

Feeding tube in the gastric body region.

## 2024-03-22 IMAGING — DX DG CHEST 1V PORT
1 series · 1 of 1 positions shown · non-contrast
Comparison: Radiographs earlier the same date and 01/27/2022. CT
01/27/2022.

CLINICAL DATA: Endotracheal tube placement.

EXAM:
PORTABLE CHEST 1 VIEW

[chest]
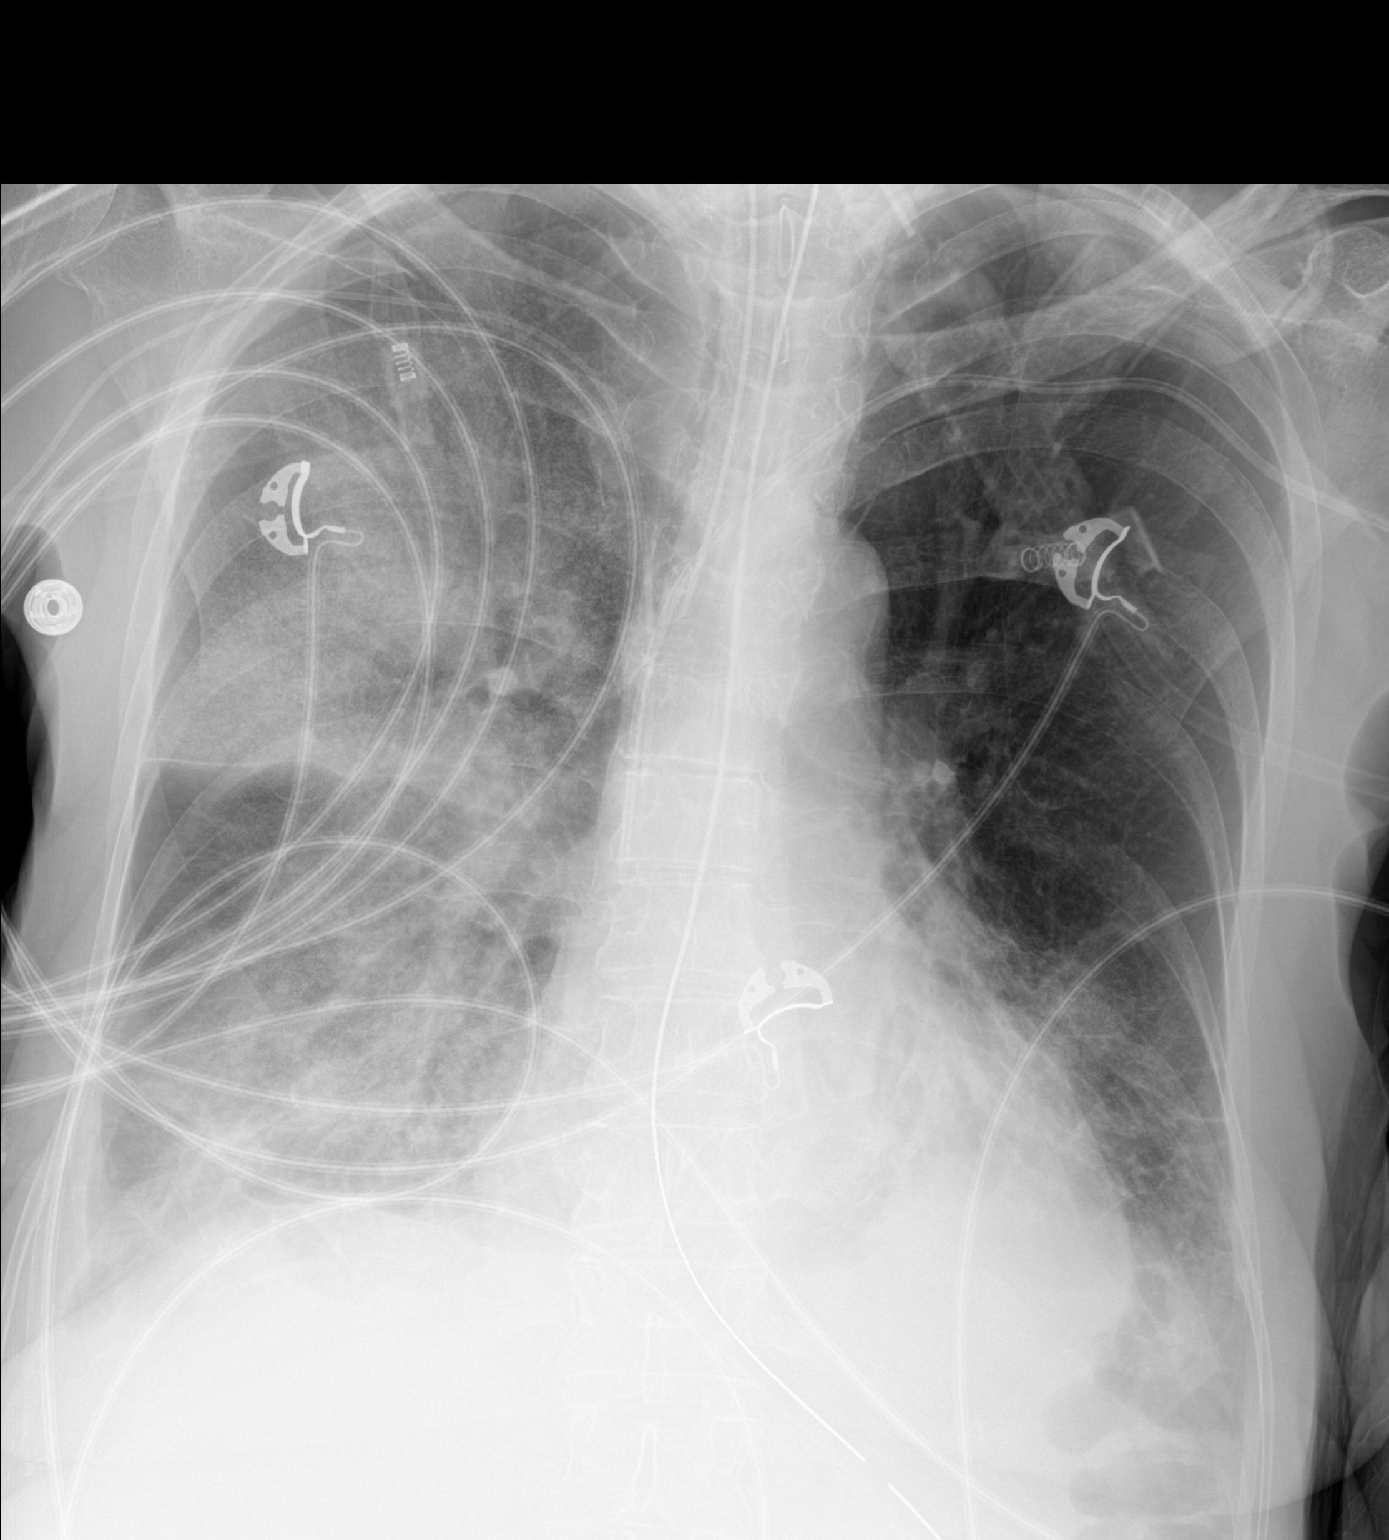

[1 of 1 positions shown; findings below may reference images not displayed]

FINDINGS: 6998 hours. Endotracheal tube has been pulled back with its tip 2 cm
above the carina. Enteric tube appears unchanged, projecting below
the diaphragm. Left subclavian central venous catheter and right arm
PICC appear unchanged.

The heart size and mediastinal contours are stable. There are
unchanged right greater than left airspace opacities. There is a
small right pleural effusion. No evidence pneumothorax or acute
osseous abnormality.
IMPRESSION: Interval repositioning of the endotracheal tube, tip 2 cm above the
carina. No other significant changes.

## 2024-03-23 IMAGING — DX DG CHEST 1V PORT
1 series · 1 of 1 positions shown · non-contrast
Comparison: Portable chest yesterday at [DATE] a.m.

CLINICAL DATA: Multifocal pneumonia, follow-up, ventilator
dependent.

EXAM:
PORTABLE CHEST 1 VIEW

[chest ap]
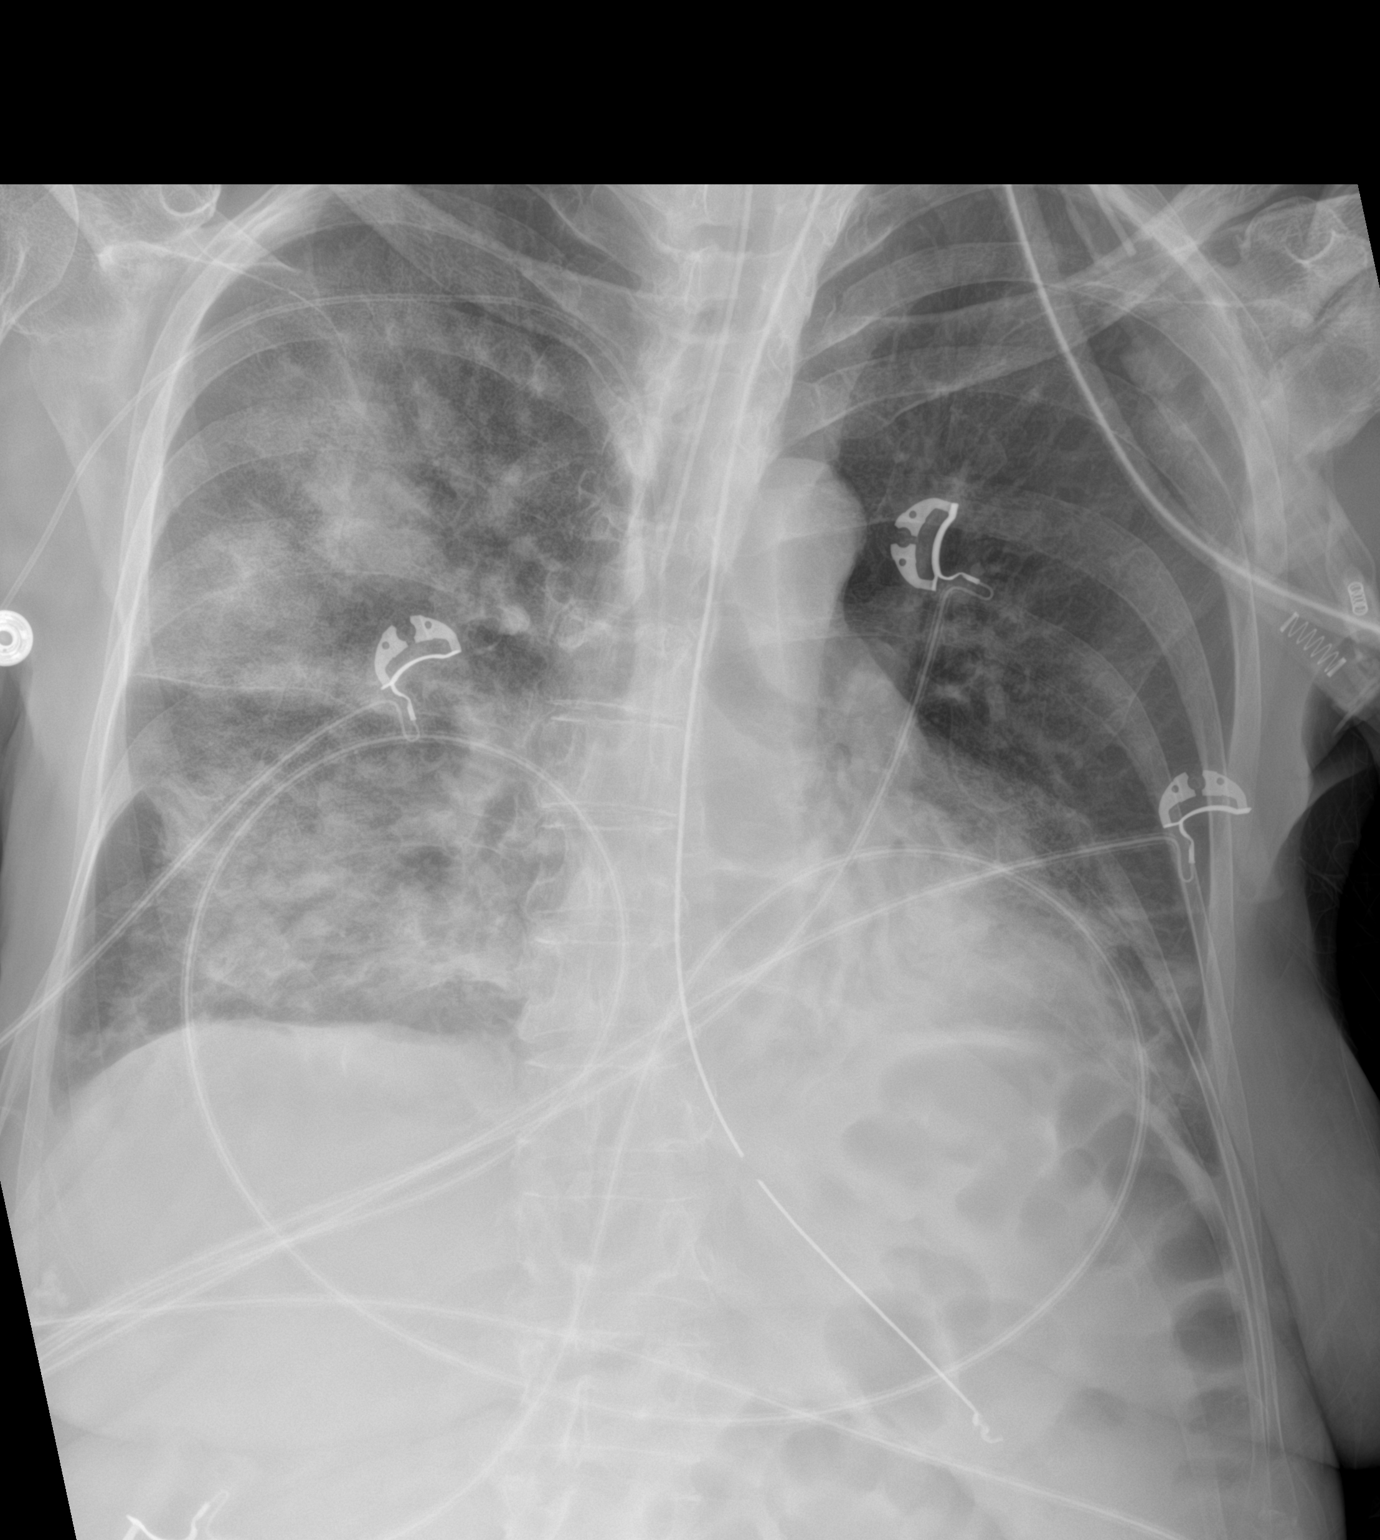

[1 of 1 positions shown; findings below may reference images not displayed]

FINDINGS: [DATE] a.m., 01/30/2022. ETT tip is 1.7 cm from the carina and could
be withdrawn 1-2 cm for better positioning.

Right PICC tip is at the level of the superior cavoatrial junction
with left subclavian line interval removal.

NGT enters the stomach with the proximal side hole at the hiatus
with interval pullback, could be advanced further in 5-8 cm for
better positioning.

Patchy dense consolidation of the right upper and both lower lung
fields is again noted and slightly denser and more confluent today
than yesterday, but no new area of opacity seen.

The left mid and upper lung fields remain clear. There is no
pneumothorax.

There are minimal pleural effusions. Stable mediastinum with normal
cardiac size.
IMPRESSION: 1. ETT could be withdrawn 1-2 cm for better positioning, NGT could
be advanced 5-8 cm for optimal placement.
2. Interval removal left subclavian line. No pneumothorax. Right
PICC unchanged.
3. Right-greater-than-left air pace disease, denser and more
confluent today but no new infiltrate is seen. Minimal pleural
effusions.

## 2024-03-23 IMAGING — CT CT ABD-PELV W/O CM
2 of 4 series · 14 of 46 positions shown, 16 images · non-contrast
Comparison: 01/27/2022

CLINICAL DATA: Recent left femoral hernia repair with small bowel
resection, evaluate for anastomotic leak

EXAM:
CT ABDOMEN AND PELVIS WITHOUT CONTRAST
TECHNIQUE: Multidetector CT imaging of the abdomen and pelvis was performed
following the standard protocol without IV contrast. Oral contrast
was administered.
RADIATION DOSE REDUCTION: This exam was performed according to the
departmental dose-optimization program which includes automated
exposure control, adjustment of the mA and/or kV according to
patient size and/or use of iterative reconstruction technique.

[Series 3: abd/ pelvis 5.0 i30f 2 · axial · 0.98mm/px · z∈[+984,+1344]mm · 11 of 86 slices shown, 13 images]
[im 7/86  soft-tissue]
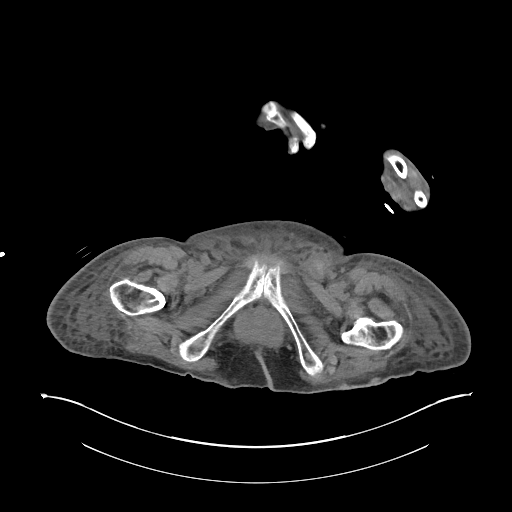
[im 7/86  bone]
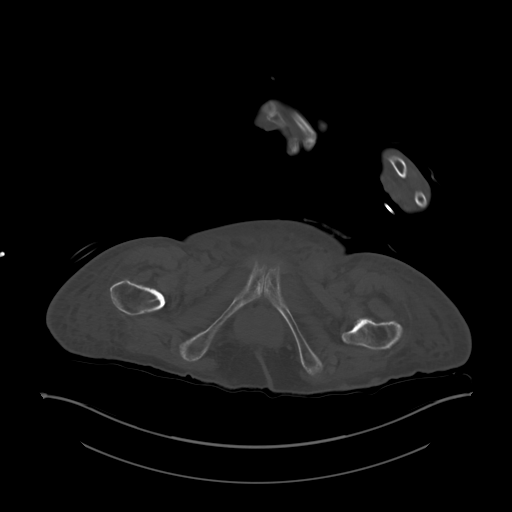
[im 14/86  soft-tissue]
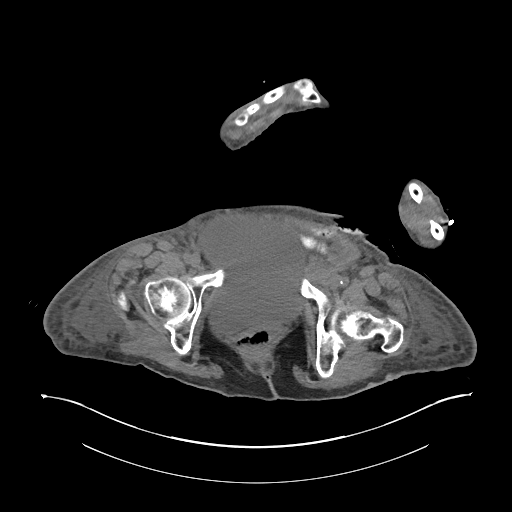
[im 21/86  soft-tissue]
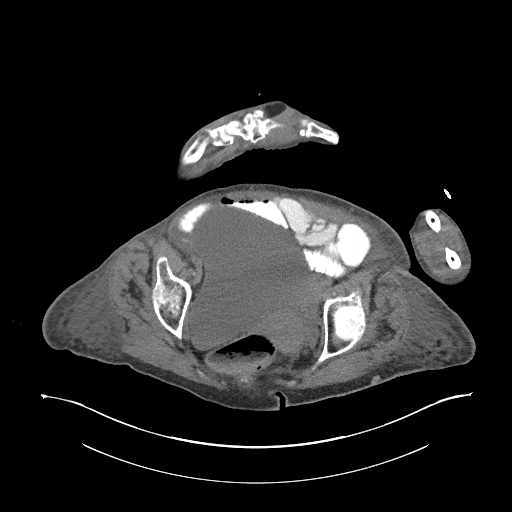
[im 28/86  soft-tissue]
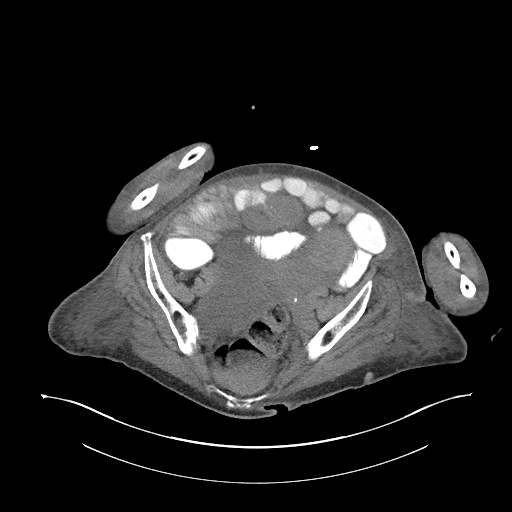
[im 35/86  soft-tissue]
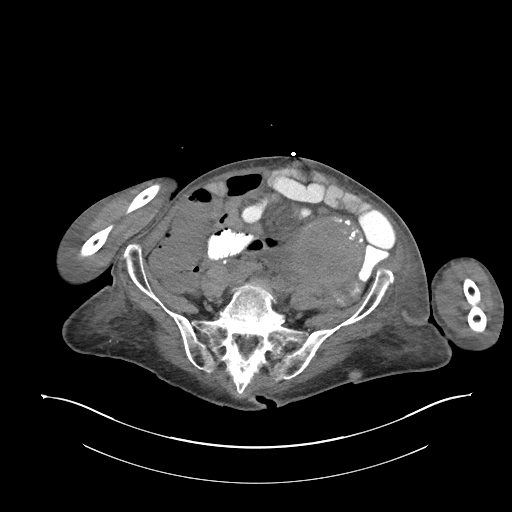
[im 45/86  soft-tissue]
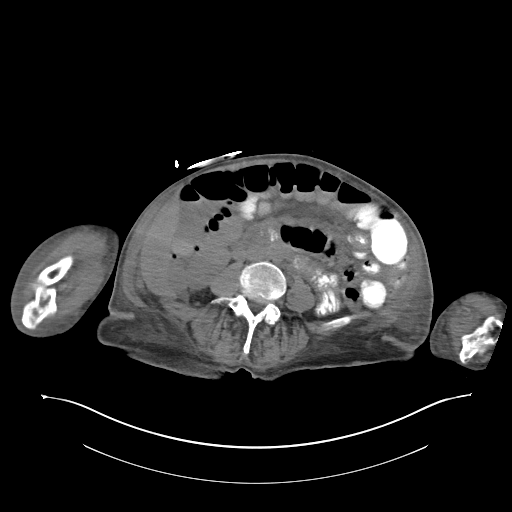
[im 52/86  soft-tissue]
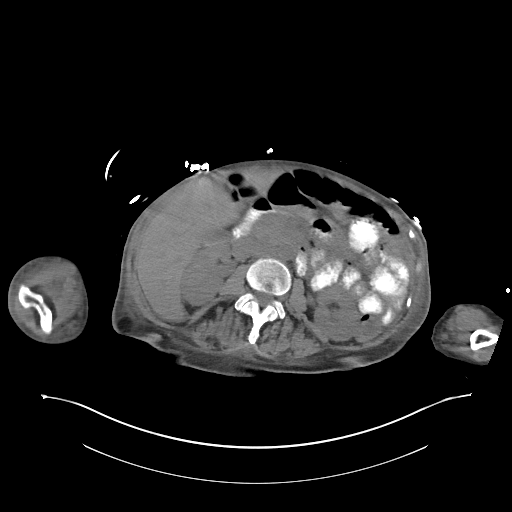
[im 58/86  soft-tissue]
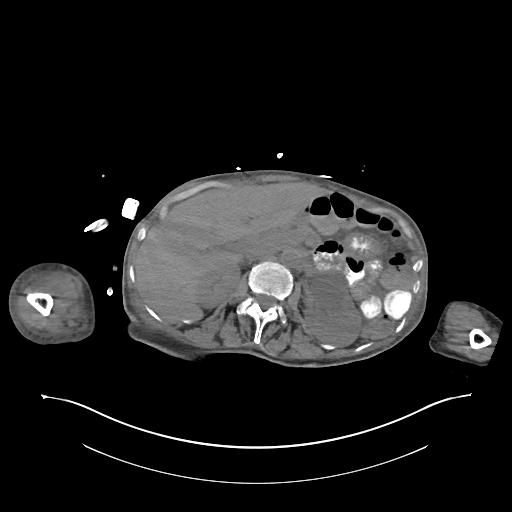
[im 65/86  soft-tissue]
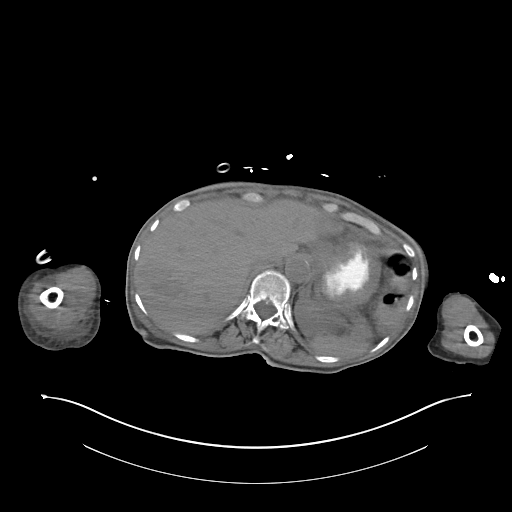
[im 65/86  bone]
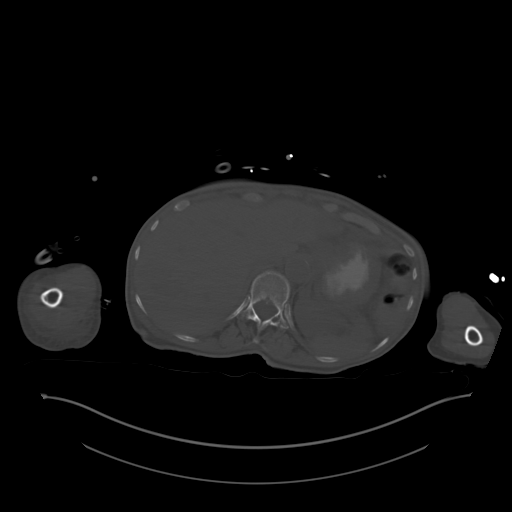
[im 72/86  soft-tissue]
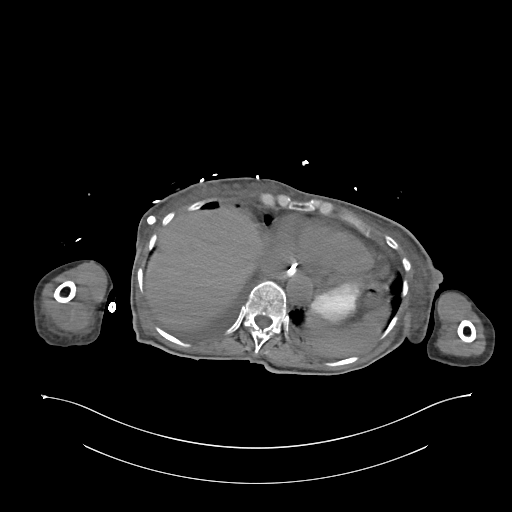
[im 79/86  soft-tissue]
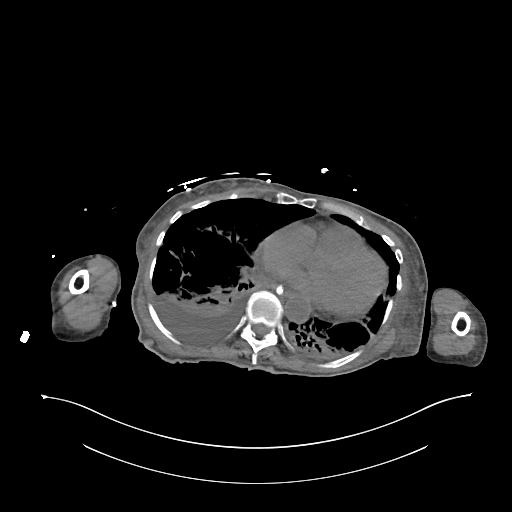

[Series 6: cor st · coronal · 0.69mm/px · 3 of 100 slices shown]
[im 34/100  soft-tissue]
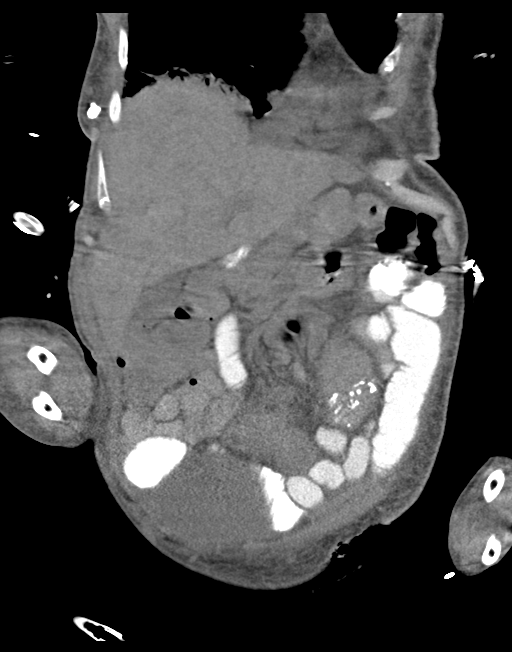
[im 45/100  soft-tissue]
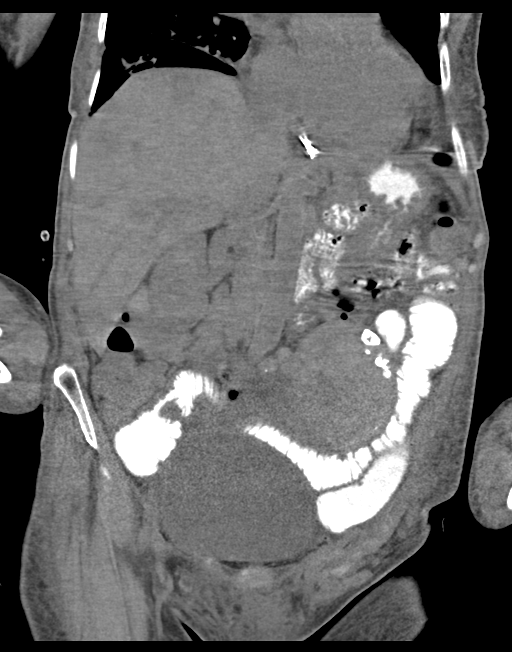
[im 56/100  soft-tissue]
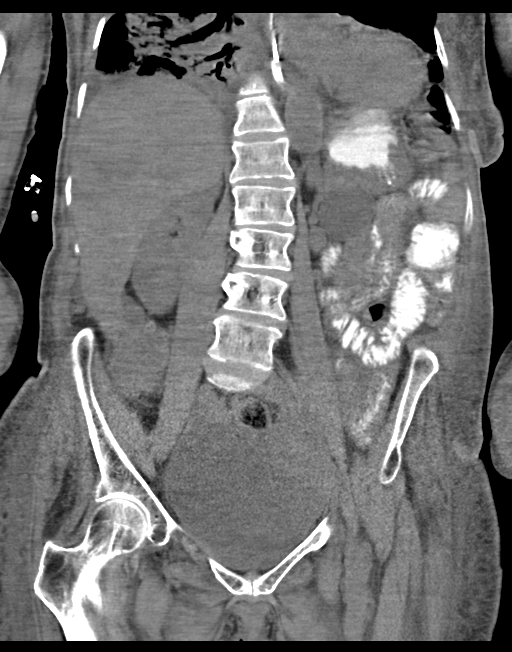

[14 of 46 positions shown; findings below may reference images not displayed]

FINDINGS: Lower chest: Large infiltrates are seen in both lower lung fields,
more so on the right side suggesting atelectasis/pneumonia. Heart is
enlarged in size. There is small to moderate right pleural effusion
with interval increase.

Hepatobiliary: Liver is enlarged measuring 20.3 cm in length. There
are few scattered low-density lesions of varying sizes in the liver
which are not fully evaluated in this noncontrast study. There is no
dilation of bile ducts. Gallbladder is difficult to visualize.

Pancreas: No focal abnormality is seen.

Spleen: Unremarkable.

Adrenals/Urinary Tract: Adrenals are unremarkable. There is no
hydronephrosis. There is possible cyst in the anterior midportion of
left kidney. There are no demonstrable renal or ureteral stones.
Urinary bladder is distended.

Stomach/Bowel: Tip of enteric tube is seen at the level of lower
thoracic esophagus close to the gastroesophageal junction. Stomach
is not distended. There is mild dilation of few small bowel loops.
Surgical staples are seen in the small bowel loops in the lower mid
abdomen. There is 5.9 x 3.3 cm focal fluid collection adjacent to
the surgical staples. There is contrast in the lumen of multiple
small bowel loops. Contrast has not entered the colon. There is
evidence of small amount of contrast in the region of an open wound
in the skin in the left inguinal region. This finding was not seen
in the previous study. Findings suggest possible extravasation of
contrast from small bowel loop. Exact source of this leak is not
clearly identified. There are partly decompressed small-bowel loops
immediately behind the left rectus muscle. There is no demonstrable
extravasation of contrast into the peritoneal cavity. Appendix is
not distinctly seen. There is no significant wall thickening in
colon.

Vascular/Lymphatic: Unremarkable.

Reproductive: Uterus is enlarged with calcified fibroids largest
measuring a proximally 7.3 cm in diameter in the left side of the
fundus.

Other: There is no demonstrable pneumoperitoneum. There is diffuse
edema in the subcutaneous plane suggesting anasarca. There is 3.7 x
3.5 cm complex structure in the region of left femoral canal
possibly postoperative hematoma/seroma.

Musculoskeletal: Degenerative changes are noted in the lumbar spine
more severe at L5-S1 level.
IMPRESSION: There is an open wound in the skin in the left inguinal region.
There is interval appearance of high density material in this area,
possibly extravasated oral contrast suggesting possible
enterocutaneous fistula. Exact source of this extravasation is not
clearly identified. There are few partly decompressed and partly
dilated small bowel loops in the left lower quadrant of abdomen.

Surgical staples are seen in the small bowel loops in the lower mid
abdomen. There is 5.9 x 3.3 cm fluid density adjacent to the
anastomotic site, possibly resolving hematoma or seroma. There are
no pockets of air within this fluid collection.

There is 3.7 x 3.5 cm complex structure in the region of left
femoral canal possibly postoperative hematoma/seroma.

There is no evidence of intestinal obstruction or pneumoperitoneum.
There is no hydronephrosis.

Large infiltrates are seen in both lower lung fields, more so on the
right side suggesting multifocal pneumonia. Small to moderate right
pleural effusion with interval increase in size.

There is diffuse edema in subcutaneous plane suggesting anasarca.
There are multiple low-density lesions of varying sizes in the liver
which are not fully evaluated in this noncontrast study.

Tip of enteric tube is noted in the area of lower thoracic esophagus
close to the gastroesophageal junction. Enteric tube should be
advanced 10 cm to place the tip within stomach.

Enlarged uterus with fibroids.

Other findings as described in the body of the report.

## 2024-03-26 ENCOUNTER — Encounter: Payer: Medicare HMO | Admitting: Family Medicine

## 2024-04-15 ENCOUNTER — Ambulatory Visit: Admitting: Family Medicine

## 2024-04-15 VITALS — BP 109/70 | HR 81 | Temp 97.2°F | Ht 67.0 in | Wt 99.6 lb

## 2024-04-15 DIAGNOSIS — E559 Vitamin D deficiency, unspecified: Secondary | ICD-10-CM

## 2024-04-15 DIAGNOSIS — F419 Anxiety disorder, unspecified: Secondary | ICD-10-CM

## 2024-04-15 DIAGNOSIS — Z13220 Encounter for screening for lipoid disorders: Secondary | ICD-10-CM | POA: Diagnosis not present

## 2024-04-15 DIAGNOSIS — M150 Primary generalized (osteo)arthritis: Secondary | ICD-10-CM

## 2024-04-15 DIAGNOSIS — G20A1 Parkinson's disease without dyskinesia, without mention of fluctuations: Secondary | ICD-10-CM | POA: Diagnosis not present

## 2024-04-15 DIAGNOSIS — R5381 Other malaise: Secondary | ICD-10-CM

## 2024-04-15 DIAGNOSIS — M5030 Other cervical disc degeneration, unspecified cervical region: Secondary | ICD-10-CM

## 2024-04-15 DIAGNOSIS — Z1211 Encounter for screening for malignant neoplasm of colon: Secondary | ICD-10-CM

## 2024-04-15 DIAGNOSIS — R5383 Other fatigue: Secondary | ICD-10-CM | POA: Diagnosis not present

## 2024-04-15 DIAGNOSIS — Z131 Encounter for screening for diabetes mellitus: Secondary | ICD-10-CM | POA: Diagnosis not present

## 2024-04-15 DIAGNOSIS — E538 Deficiency of other specified B group vitamins: Secondary | ICD-10-CM

## 2024-04-15 DIAGNOSIS — Z0001 Encounter for general adult medical examination with abnormal findings: Secondary | ICD-10-CM | POA: Diagnosis not present

## 2024-04-15 MED ORDER — CYANOCOBALAMIN 1000 MCG/ML IJ SOLN
1000.0000 ug | Freq: Once | INTRAMUSCULAR | Status: AC
  Administered 2024-04-15: 1000 ug via INTRAMUSCULAR

## 2024-04-15 MED ORDER — METHYLPREDNISOLONE ACETATE 80 MG/ML IJ SUSP
80.0000 mg | Freq: Once | INTRAMUSCULAR | Status: AC
  Administered 2024-04-15: 80 mg via INTRAMUSCULAR

## 2024-04-15 NOTE — Assessment & Plan Note (Signed)
 She is interested in restarting B12 injections.  Will check labs today and give injection after blood draw.

## 2024-04-15 NOTE — Assessment & Plan Note (Signed)
 We started amitriptyline  at her last visit here however she cannot tolerate due to side effects.  She has been on several other medications in the past including Cymbalta , Seroquel , Lexapro , and BuSpar .  She has had side effects with all of these.  She does not wish to try any additional medications at this point.  She does use low-dose diazepam  sporadically as needed.  Does not need refill today.

## 2024-04-15 NOTE — Progress Notes (Signed)
 Chief Complaint:  [name] [name] is a 73 y.o. female who presents today for her annual comprehensive physical exam.    Assessment/Plan:  Chronic Problems Addressed Today: Osteoarthritis Pain not fully controlled.  She has done well with Depo-Medrol  injections in the past.  Will give 80 mg IM today.  If this continues to be an issue would consider referral to sports medicine or orthopedics.  Debility Overall symptoms are stable though we will complete her paperwork today for VA benefits and FMLA for her daughter.  Parkinson disease [facility]) Follows with neurology.  On Sinemet .  Is working on getting a office closer to her house.  DDD (degenerative disc disease), cervical Worsened recently as above.  She is interested in repeat Depo-Medrol  injection.  Will perform this today.  She tolerated well.  She will let us  know if this continues to be an issue and we can refer her to orthopedics or physical therapy.  B12 deficiency She is interested in restarting B12 injections.  Will check labs today and give injection after blood draw.   Anxiety We started amitriptyline  at her last visit here however she cannot tolerate due to side effects.  She has been on several other medications in the past including Cymbalta , Seroquel , Lexapro , and BuSpar .  She has had side effects with all of these.  She does not wish to try any additional medications at this point.  She does use low-dose diazepam  sporadically as needed.  Does not need refill today.  Fatigue Check labs cleaning CBC, c-Met, TSH, B12, and iron panel.  Preventative Healthcare: Check labs.  Will order Cologuard today.  She can get shingles and tetanus at the pharmacy.  Due for mammogram-they will schedule this soon.  Patient Counseling(The following topics were reviewed and/or handout was given):  -Nutrition: Stressed importance of moderation in sodium/caffeine intake, saturated fat and cholesterol, caloric balance, sufficient intake of fresh  fruits, vegetables, and fiber.  -Stressed the importance of regular exercise.   -Substance Abuse: Discussed cessation/primary prevention of tobacco, alcohol, or other drug use; driving or other dangerous activities under the influence; availability of treatment for abuse.   -Injury prevention: Discussed safety belts, safety helmets, smoke detector, smoking near bedding or upholstery.   -Sexuality: Discussed sexually transmitted diseases, partner selection, use of condoms, avoidance of unintended pregnancy and contraceptive alternatives.   -Dental health: Discussed importance of regular tooth brushing, flossing, and dental visits.  -Health maintenance and immunizations reviewed. Please refer to Health maintenance section.  Return to care in 1 year for next preventative visit.     Subjective:  HPI:  She has no acute complaints today. See Assessment / plan for status of chronic conditions.  She is here today with her daughter.  She has a few forms that she would like for us  to fill out today.  She also would like to have a Depo-Medrol  injection for her osteoarthritis.  She had this performed about 10 months ago and did very well with it.  She would also like to restart her B12 injections.      04/15/2024    2:01 PM  Depression screen PHQ 2/9  Decreased Interest 0  Down, Depressed, Hopeless 0  PHQ - 2 Score 0    Health Maintenance Due  Topic Date Due   Fecal DNA (Cologuard)  Never done   MAMMOGRAM  07/12/2023     ROS: Per HPI, otherwise a complete review of systems was negative.   PMH:  The following were reviewed and  entered/updated in epic: Past Medical History:  Diagnosis Date   Fibroid    GERD (gastroesophageal reflux disease)    Muscle weakness of lower extremity 01/17/2015   Osteopenia    Parkinson disease (HCC) 01/17/2015   Patient Active Problem List   Diagnosis Date Noted   Flatulence 03/21/2023   B12 deficiency 06/28/2022   Enterocutaneous fistula 02/05/2022    Pressure injury of coccygeal region, stage 1 01/23/2022   Goals of care, counseling/discussion    Protein-calorie malnutrition, severe 01/20/2022   DDD (degenerative disc disease), cervical 06/18/2019   Constipation 03/02/2019   Seasonal allergies 03/02/2019   Anxiety 05/07/2018   Fatigue 04/20/2018   Osteoarthritis 04/20/2018   Palpitations 04/20/2018   Cardiomegaly 04/20/2018   Lesion of liver 04/20/2018   Lung nodule 04/20/2018   Debility 01/17/2015   Parkinson disease (HCC) 01/17/2015   Fibroid uterus 11/13/2012   Past Surgical History:  Procedure Laterality Date   FINGER SURGERY     INGUINAL HERNIA REPAIR Left 01/17/2022   Procedure: Left femoral hernia repair with mesh and small bowel resection and anastomosis;  Surgeon: Kinsinger, Alphonso Aschoff, MD;  Location: [facility] OR;  Service: General;  Laterality: Left;   MOUTH SURGERY     TOE SURGERY     TONSILLECTOMY     TUBAL LIGATION     x2    Family History  Problem Relation Age of Onset   Rheum arthritis Mother    Breast cancer Mother        63's   Cancer Father        throat   Breast cancer Sister        54's   Hypertension Brother    Hyperlipidemia Brother    Heart attack Son        Deceased, 23   Healthy Daughter    Parkinsonism Neg Hx     Medications- reviewed and updated Current Outpatient Medications  Medication Sig Dispense Refill   Amino Acids -Protein Hydrolys (FEEDING SUPPLEMENT, PRO-STAT 64,) LIQD Take 30 mLs by mouth 3 (three) times daily with meals. 900 mL 5   Ascorbic Acid  (VITAMIN C PO) Take 1 tablet by mouth daily.     b complex vitamins capsule Take 1 capsule by mouth daily.     benzonatate  (TESSALON ) 200 MG capsule Take 1 capsule (200 mg total) by mouth 2 (two) times daily as needed for cough. 20 capsule 0   carbidopa -levodopa  (SINEMET  IR) 25-250 MG tablet Take 1 tablet by mouth 4 (four) times daily. 120 tablet 0   Cholecalciferol (VITAMIN D ) 2000 UNITS CAPS Take 2,000 Units by mouth daily.      diazepam  (VALIUM ) 5 MG tablet TAKE 1/2 TO 1 TABLET(2.5 TO 5 MG) BY MOUTH EVERY 12 HOURS AS NEEDED FOR ANXIETY 30 tablet 5   feeding supplement (ENSURE ENLIVE / ENSURE PLUS) LIQD Take 237 mLs by mouth 3 (three) times daily between meals. 237 mL 12   glucosamine-chondroitin 500-400 MG tablet Take 1 tablet by mouth daily.     hydrocortisone  cream 1 % Apply topically daily. 30 g 0   ibuprofen (ADVIL) 200 MG tablet Take 200 mg by mouth every 6 (six) hours as needed.     lidocaine  (LIDODERM ) 5 % Place 1 patch onto the skin daily. Remove & Discard patch within 12 hours or as directed by MD 30 patch 0   magnesium  30 MG tablet Take 30 mg by mouth 2 (two) times daily.     Multiple Vitamin (MULTIVITAMIN) capsule Take 1  capsule by mouth daily.     Omega-3 Fatty Acids (OMEGA 3 PO) Take 1 tablet by mouth daily.     pantoprazole  (PROTONIX ) 40 MG tablet TAKE 1 TABLET BY MOUTH ONCE DAILY 90 tablet 1   Simethicone  125 MG TABS Take 2 tablets (250 mg total) by mouth in the morning and at bedtime. 120 tablet 11   tetrahydrozoline-zinc (VISINE-AC) 0.05-0.25 % ophthalmic solution Place 2 drops into both eyes 4 (four) times daily as needed (allergies).     vitamin B-12 (CYANOCOBALAMIN ) 1000 MCG tablet Take 1,000 mcg by mouth daily.     No current facility-administered medications for this visit.    Allergies-reviewed and updated Allergies  Allergen Reactions   Sulfa Antibiotics Other (See Comments)    Childhood allergy     Social History   Socioeconomic History   Marital status: Divorced    Spouse name: Not on file   Number of children: 4   Years of education: B.A.   Highest education level: Not on file  Occupational History   Occupation: SECRETARY  Tobacco Use   Smoking status: Never   Smokeless tobacco: Never  Vaping Use   Vaping status: Never Used  Substance and Sexual Activity   Alcohol use: No    Alcohol/week: 0.0 standard drinks of alcohol   Drug use: No   Sexual activity: Never  Other  Topics Concern   Not on file  Social History Narrative   Patient is right handed.   Patient drinks 2-3 cups of caffeine per day.   Lives alone in a 2 story home.  Has 4 living children.  1 passed away at age 54.     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   [employer] level of education: college   Social Drivers of Corporate investment banker Strain: Low Risk  (01/01/2024)   Overall Financial Resource Strain (CARDIA)    Difficulty of Paying Living Expenses: Not hard at all  Food Insecurity: No Food Insecurity (01/01/2024)   Hunger Vital Sign    Worried About Running Out of Food in the Last Year: Never true    Ran Out of Food in the Last Year: Never true  Transportation Needs: No Transportation Needs (01/01/2024)   PRAPARE - Administrator, Civil Service (Medical): No    Lack of Transportation (Non-Medical): No  Physical Activity: Insufficiently Active (01/01/2024)   Exercise Vital Sign    Days of Exercise per Week: 5 days    Minutes of Exercise per Session: 20 min  Stress: No Stress Concern Present (01/01/2024)   [employer] of Occupational Health - Occupational Stress Questionnaire    Feeling of Stress : Only a little  Social Connections: Moderately Isolated (01/01/2024)   Social Connection and Isolation Panel    Frequency of Communication with Friends and Family: More than three times a week    Frequency of Social Gatherings with Friends and Family: Three times a week    Attends Religious Services: More than 4 times per year    Active Member of Clubs or Organizations: No    Attends Banker Meetings: Never    Marital Status: Divorced        Objective:  Physical Exam: BP 109/70   Pulse 81   Temp (!) 97.2 F (36.2 C) (Temporal)   Ht 5' 7 (1.702 m)   Wt 99 lb 9.6 oz (45.2 kg)   SpO2 99%   BMI 15.60 kg/m  Body mass index is 15.6 kg/m. Wt Readings from Last 3 Encounters:  04/15/24 99 lb 9.6 oz (45.2 kg)  01/01/24 110 lb  (49.9 kg)  08/22/23 100 lb (45.4 kg)   Gen: NAD, resting comfortably HEENT: OP clear. No thyromegaly noted.  CV: RRR with no murmurs appreciated Pulm: NWOB, CTAB with no crackles, wheezes, or rhonchi GI: Normal bowel sounds present. Soft, Nontender, Nondistended. MSK: no edema, cyanosis, or clubbing noted Skin: warm, dry Neuro: CN2-12 grossly intact. Psych: Normal affect and thought content     Ayriana Wix M. Daneil Dunker, MD 04/15/2024 2:58 PM

## 2024-04-15 NOTE — Assessment & Plan Note (Signed)
 Check labs cleaning CBC, c-Met, TSH, B12, and iron panel.

## 2024-04-15 NOTE — Patient Instructions (Signed)
 It was very nice to see you today!  We will check blood work today.  I will complete your paperwork.   We will give you a injection of B12 and cortisone today.  Will see back in 6 months for follow-up visit.  Please come back sooner if needed.  Return in about 6 months (around 10/15/2024) for Follow Up.   Take care, Dr Daneil Dunker  PLEASE NOTE:  If you had any lab tests, please let us  know if you have not heard back within a few days. You may see your results on mychart before we have a chance to review them but we will give you a call once they are reviewed by us .   If we ordered any referrals today, please let us  know if you have not heard from their office within the next week.   If you had any urgent prescriptions sent in today, please check with the pharmacy within an hour of our visit to make sure the prescription was transmitted appropriately.   Please try these tips to maintain a healthy lifestyle:  Eat at least 3 REAL meals and 1-2 snacks per day.  Aim for no more than 5 hours between eating.  If you eat breakfast, please do so within one hour of getting up.   Each meal should contain half fruits/vegetables, one quarter protein, and one quarter carbs (no bigger than a computer mouse)  Cut down on sweet beverages. This includes juice, soda, and sweet tea.   Drink at least 1 glass of water  with each meal and aim for at least 8 glasses per day  Exercise at least 150 minutes every week.    Preventive Care 90 Years and Older, Female Preventive care refers to lifestyle choices and visits with your health care provider that can promote health and wellness. Preventive care visits are also called wellness exams. What can I expect for my preventive care visit? Counseling Your health care provider may ask you questions about your: Medical history, including: Past medical problems. Family medical history. Pregnancy and menstrual history. History of falls. Current health,  including: Memory and ability to understand (cognition). Emotional well-being. Home life and relationship well-being. Sexual activity and sexual health. Lifestyle, including: Alcohol, nicotine or tobacco, and drug use. Access to firearms. Diet, exercise, and sleep habits. Work and work Astronomer. Sunscreen use. Safety issues such as seatbelt and bike helmet use. Physical exam Your health care provider will check your: Height and weight. These may be used to calculate your BMI (body mass index). BMI is a measurement that tells if you are at a healthy weight. Waist circumference. This measures the distance around your waistline. This measurement also tells if you are at a healthy weight and may help predict your risk of certain diseases, such as type 2 diabetes and high blood pressure. Heart rate and blood pressure. Body temperature. Skin for abnormal spots. What immunizations do I need?  Vaccines are usually given at various ages, according to a schedule. Your health care provider will recommend vaccines for you based on your age, medical history, and lifestyle or other factors, such as travel or where you work. What tests do I need? Screening Your health care provider may recommend screening tests for certain conditions. This may include: Lipid and cholesterol levels. Hepatitis C test. Hepatitis B test. HIV (human immunodeficiency virus) test. STI (sexually transmitted infection) testing, if you are at risk. Lung cancer screening. Colorectal cancer screening. Diabetes screening. This is done by checking your blood  sugar (glucose) after you have not eaten for a while (fasting). Mammogram. Talk with your health care provider about how often you should have regular mammograms. BRCA-related cancer screening. This may be done if you have a family history of breast, ovarian, tubal, or peritoneal cancers. Bone density scan. This is done to screen for osteoporosis. Talk with your health  care provider about your test results, treatment options, and if necessary, the need for more tests. Follow these instructions at home: Eating and drinking  Eat a diet that includes fresh fruits and vegetables, whole grains, lean protein, and low-fat dairy products. Limit your intake of foods with high amounts of sugar, saturated fats, and salt. Take vitamin and mineral supplements as recommended by your health care provider. Do not drink alcohol if your health care provider tells you not to drink. If you drink alcohol: Limit how much you have to 0-1 drink a day. Know how much alcohol is in your drink. In the [place] one drink equals one 12 oz bottle of beer (355 mL), one 5 oz glass of wine (148 mL), or one 1 oz glass of hard liquor (44 mL). Lifestyle Brush your teeth every morning and night with fluoride toothpaste. Floss one time each day. Exercise for at least 30 minutes 5 or more days each week. Do not use any products that contain nicotine or tobacco. These products include cigarettes, chewing tobacco, and vaping devices, such as e-cigarettes. If you need help quitting, ask your health care provider. Do not use drugs. If you are sexually active, practice safe sex. Use a condom or other form of protection in order to prevent STIs. Take aspirin only as told by your health care provider. Make sure that you understand how much to take and what form to take. Work with your health care provider to find out whether it is safe and beneficial for you to take aspirin daily. Ask your health care provider if you need to take a cholesterol-lowering medicine (statin). Find healthy ways to manage stress, such as: Meditation, yoga, or listening to music. Journaling. Talking to a trusted person. Spending time with friends and family. Minimize exposure to UV radiation to reduce your risk of skin cancer. Safety Always wear your seat belt while driving or riding in a vehicle. Do not drive: If you have  been drinking alcohol. Do not ride with someone who has been drinking. When you are tired or distracted. While texting. If you have been using any mind-altering substances or drugs. Wear a helmet and other protective equipment during sports activities. If you have firearms in your house, make sure you follow all gun safety procedures. What's next? Visit your health care provider once a year for an annual wellness visit. Ask your health care provider how often you should have your eyes and teeth checked. Stay up to date on all vaccines. This information is not intended to replace advice given to you by your health care provider. Make sure you discuss any questions you have with your health care provider. Document Revised: 04/11/2021 Document Reviewed: 04/11/2021 Elsevier Patient Education  2024 [employer].

## 2024-04-15 NOTE — Assessment & Plan Note (Signed)
 Overall symptoms are stable though we will complete her paperwork today for VA benefits and FMLA for her daughter.

## 2024-04-15 NOTE — Assessment & Plan Note (Signed)
 Worsened recently as above.  She is interested in repeat Depo-Medrol  injection.  Will perform this today.  She tolerated well.  She will let us  know if this continues to be an issue and we can refer her to orthopedics or physical therapy.

## 2024-04-15 NOTE — Assessment & Plan Note (Signed)
 Pain not fully controlled.  She has done well with Depo-Medrol  injections in the past.  Will give 80 mg IM today.  If this continues to be an issue would consider referral to sports medicine or orthopedics.

## 2024-04-15 NOTE — Assessment & Plan Note (Signed)
 Follows with neurology.  On Sinemet .  Is working on getting a office closer to her house.

## 2024-04-16 LAB — IBC + FERRITIN
Ferritin: 56.7 ng/mL (ref 10.0–291.0)
Iron: 72 ug/dL (ref 42–145)
Saturation Ratios: 24.7 % (ref 20.0–50.0)
TIBC: 291.2 ug/dL (ref 250.0–450.0)
Transferrin: 208 mg/dL — ABNORMAL LOW (ref 212.0–360.0)

## 2024-04-16 LAB — LIPID PANEL
Cholesterol: 145 mg/dL (ref 0–200)
HDL: 72.4 mg/dL (ref >39.00)
LDL Cholesterol: 65 mg/dL (ref 0–99)
NonHDL: 72.65
Total CHOL/HDL Ratio: 2
Triglycerides: 40 mg/dL (ref 0.0–149.0)
VLDL: 8 mg/dL (ref 0.0–40.0)

## 2024-04-16 LAB — HEMOGLOBIN A1C: Hgb A1c MFr Bld: 5.7 % (ref 4.6–6.5)

## 2024-04-16 LAB — CBC
HCT: 40.3 % (ref 36.0–46.0)
Hemoglobin: 13.4 g/dL (ref 12.0–15.0)
MCHC: 33.3 g/dL (ref 30.0–36.0)
MCV: 90.1 fl (ref 78.0–100.0)
Platelets: 146 10*3/uL — ABNORMAL LOW (ref 150.0–400.0)
RBC: 4.47 Mil/uL (ref 3.87–5.11)
RDW: 13.2 % (ref 11.5–15.5)
WBC: 6.7 10*3/uL (ref 4.0–10.5)

## 2024-04-16 LAB — TSH: TSH: 0.66 u[IU]/mL (ref 0.35–5.50)

## 2024-04-16 LAB — COMPREHENSIVE METABOLIC PANEL WITH GFR
ALT: 8 U/L (ref 0–35)
AST: 13 U/L (ref 0–37)
Albumin: 4 g/dL (ref 3.5–5.2)
Alkaline Phosphatase: 49 U/L (ref 39–117)
BUN: 14 mg/dL (ref 6–23)
CO2: 29 meq/L (ref 19–32)
Calcium: 9.4 mg/dL (ref 8.4–10.5)
Chloride: 103 meq/L (ref 96–112)
Creatinine, Ser: 0.6 mg/dL (ref 0.40–1.20)
GFR: 88.58 mL/min (ref >60.00)
Glucose, Bld: 70 mg/dL (ref 70–99)
Potassium: 4.1 meq/L (ref 3.5–5.1)
Sodium: 139 meq/L (ref 135–145)
Total Bilirubin: 0.4 mg/dL (ref 0.2–1.2)
Total Protein: 6.7 g/dL (ref 6.0–8.3)

## 2024-04-16 LAB — VITAMIN B12: Vitamin B-12: 975 pg/mL — ABNORMAL HIGH (ref 211–911)

## 2024-04-16 LAB — VITAMIN D 25 HYDROXY (VIT D DEFICIENCY, FRACTURES): VITD: 47.89 ng/mL (ref 30.00–100.00)

## 2024-04-19 ENCOUNTER — Ambulatory Visit: Payer: Self-pay | Admitting: Family Medicine

## 2024-04-19 NOTE — Progress Notes (Signed)
 Her B12 level is at goal.  She can take 1000 mcg daily for maintenance.  All of her other labs are stable.  We can recheck again in 6 to 12 months.  Do not need to make any other changes to her treatment plan at this time.

## 2024-04-22 ENCOUNTER — Telehealth: Payer: Self-pay | Admitting: Family Medicine

## 2024-04-22 NOTE — Telephone Encounter (Signed)
 Copied from CRM [id number]. Topic: General - Other >> Apr 22, 2024  1:47 PM Geroldine GRADE wrote: Reason for CRM: Patients daughter melvinie is calling in to see if the FMLA forms that were dropped off are ready for pick up they have to be turned in on the 29th or this coming Sunday

## 2024-04-23 NOTE — Telephone Encounter (Signed)
 Called patient notified FMLA ready to be pick up

## 2024-04-23 NOTE — Telephone Encounter (Signed)
 Form placed to be sign by PCP at his office

## 2024-05-04 NOTE — Telephone Encounter (Signed)
 Copied from CRM [id number]. Topic: General - Other >> May 03, 2024  1:11 PM Viola F wrote: Reason for CRM: Patient daughter Jerzy will be dropping off paperwork for Dr. Kennyth and needs copy of VA paperwork that was filled out by Dr. Kennyth a year ago - possibly July 2024.    Paper work not received  Demiya Magno,RMA

## 2024-05-14 ENCOUNTER — Telehealth: Payer: Self-pay | Admitting: Family Medicine

## 2024-05-14 NOTE — Telephone Encounter (Signed)
 Patient dropped off document In Home Attendant Expenses, to be filled out by provider. Patient requested to send it back via Call Patient to pick up within 5-days. Document is located in providers tray at front office.Please advise at Mobile [phone] (mobile)

## 2024-05-18 NOTE — Telephone Encounter (Signed)
Form placed in PCP office to be reviewed  

## 2024-05-24 ENCOUNTER — Encounter: Payer: Self-pay | Admitting: Family Medicine

## 2024-05-24 NOTE — Telephone Encounter (Signed)
 Letter placed inform office,patient daughter will pick up

## 2024-05-26 ENCOUNTER — Telehealth: Payer: Self-pay | Admitting: Family Medicine

## 2024-05-26 NOTE — Telephone Encounter (Signed)
 Received faxed document Home Health Certificate (Order ID [id number] ), to be filled out by provider. Patient requested to send it back via Fax  Document is located in providers tray at front office.Please advise .

## 2024-05-27 NOTE — Telephone Encounter (Signed)
Placed to be reviewed in PCP office

## 2024-05-31 NOTE — Telephone Encounter (Signed)
 Form faxed to [phone] Placed to be scan in patient chart

## 2024-06-02 DIAGNOSIS — H25813 Combined forms of age-related cataract, bilateral: Secondary | ICD-10-CM | POA: Diagnosis not present

## 2024-06-02 DIAGNOSIS — H40013 Open angle with borderline findings, low risk, bilateral: Secondary | ICD-10-CM | POA: Diagnosis not present

## 2024-06-02 DIAGNOSIS — H35033 Hypertensive retinopathy, bilateral: Secondary | ICD-10-CM | POA: Diagnosis not present

## 2024-07-26 ENCOUNTER — Encounter: Payer: Self-pay | Admitting: Family Medicine

## 2024-07-26 ENCOUNTER — Telehealth: Payer: Self-pay

## 2024-07-26 NOTE — Telephone Encounter (Signed)
 Ok to write jury duty excuse letter.  [employer]. Kennyth, MD 07/26/2024 10:25 AM

## 2024-07-26 NOTE — Telephone Encounter (Signed)
 Jur duty letter completed and contacted pt daughter to advise in front office for pick up

## 2024-07-26 NOTE — Telephone Encounter (Signed)
 Copied from CRM #[id number]. Topic: General - Other >> Jul 23, 2024  1:33 PM Mia F wrote: Reason for CRM: Pt is asking for a letter stating she cannot do jury duty because she is partially immobile and has parkinson. Please contact pt or her daughter for further details or when done  Melvinie Turnner (daughter) [phone]  Please see request from patient regarding letter to excuse her from jury duty and advise if ok to send for patient.

## 2024-10-15 ENCOUNTER — Ambulatory Visit: Admitting: Family Medicine

## 2024-11-12 ENCOUNTER — Ambulatory Visit: Admitting: Family Medicine

## 2024-12-16 ENCOUNTER — Ambulatory Visit: Admitting: Family Medicine

## 2025-01-05 ENCOUNTER — Ambulatory Visit
